# Patient Record
Sex: Male | Born: 1935
Health system: Southern US, Community
[De-identification: ages and names within clinical notes are randomized; demographics above are authoritative.]

## PROBLEM LIST (undated history)

## (undated) DIAGNOSIS — N184 Chronic kidney disease, stage 4 (severe): Secondary | ICD-10-CM

## (undated) DIAGNOSIS — I119 Hypertensive heart disease without heart failure: Secondary | ICD-10-CM

## (undated) DIAGNOSIS — I495 Sick sinus syndrome: Secondary | ICD-10-CM

## (undated) DIAGNOSIS — Z85828 Personal history of other malignant neoplasm of skin: Secondary | ICD-10-CM

## (undated) DIAGNOSIS — N400 Enlarged prostate without lower urinary tract symptoms: Secondary | ICD-10-CM

## (undated) DIAGNOSIS — I4891 Unspecified atrial fibrillation: Secondary | ICD-10-CM

## (undated) DIAGNOSIS — C61 Malignant neoplasm of prostate: Secondary | ICD-10-CM

## (undated) DIAGNOSIS — I2510 Atherosclerotic heart disease of native coronary artery without angina pectoris: Secondary | ICD-10-CM

## (undated) DIAGNOSIS — E119 Type 2 diabetes mellitus without complications: Secondary | ICD-10-CM

## (undated) DIAGNOSIS — D649 Anemia, unspecified: Secondary | ICD-10-CM

## (undated) DIAGNOSIS — I5032 Chronic diastolic (congestive) heart failure: Secondary | ICD-10-CM

## (undated) DIAGNOSIS — M519 Unspecified thoracic, thoracolumbar and lumbosacral intervertebral disc disorder: Secondary | ICD-10-CM

## (undated) DIAGNOSIS — Z950 Presence of cardiac pacemaker: Secondary | ICD-10-CM

## (undated) HISTORY — DX: Unspecified thoracic, thoracolumbar and lumbosacral intervertebral disc disorder: M51.9

## (undated) HISTORY — PX: COLONOSCOPY: SHX174

## (undated) HISTORY — DX: Sick sinus syndrome: I49.5

## (undated) HISTORY — PX: OTHER SURGICAL HISTORY: SHX169

## (undated) HISTORY — PX: EYE SURGERY: SHX253

## (undated) HISTORY — DX: Malignant neoplasm of prostate: C61

## (undated) HISTORY — DX: Benign prostatic hyperplasia without lower urinary tract symptoms: N40.0

## (undated) HISTORY — DX: Atherosclerotic heart disease of native coronary artery without angina pectoris: I25.10

---

## 1978-03-11 HISTORY — PX: APPENDECTOMY: SHX54

## 1990-03-11 DIAGNOSIS — M519 Unspecified thoracic, thoracolumbar and lumbosacral intervertebral disc disorder: Secondary | ICD-10-CM

## 1990-03-11 HISTORY — DX: Unspecified thoracic, thoracolumbar and lumbosacral intervertebral disc disorder: M51.9

## 1997-08-31 ENCOUNTER — Other Ambulatory Visit: Admission: RE | Admit: 1997-08-31 | Discharge: 1997-08-31 | Payer: Self-pay | Admitting: Gastroenterology

## 1997-09-05 ENCOUNTER — Ambulatory Visit (HOSPITAL_COMMUNITY): Admission: RE | Admit: 1997-09-05 | Discharge: 1997-09-05 | Payer: Self-pay | Admitting: Gastroenterology

## 1998-04-14 ENCOUNTER — Encounter: Payer: Self-pay | Admitting: Urology

## 1998-04-14 ENCOUNTER — Ambulatory Visit (HOSPITAL_COMMUNITY): Admission: RE | Admit: 1998-04-14 | Discharge: 1998-04-14 | Payer: Self-pay | Admitting: Urology

## 1999-09-19 ENCOUNTER — Encounter: Payer: Self-pay | Admitting: Orthopedic Surgery

## 1999-09-19 ENCOUNTER — Ambulatory Visit (HOSPITAL_COMMUNITY): Admission: RE | Admit: 1999-09-19 | Discharge: 1999-09-19 | Payer: Self-pay | Admitting: Orthopedic Surgery

## 1999-10-03 ENCOUNTER — Ambulatory Visit (HOSPITAL_COMMUNITY): Admission: RE | Admit: 1999-10-03 | Discharge: 1999-10-03 | Payer: Self-pay | Admitting: Orthopedic Surgery

## 1999-10-03 ENCOUNTER — Encounter: Payer: Self-pay | Admitting: Orthopedic Surgery

## 1999-10-16 ENCOUNTER — Ambulatory Visit (HOSPITAL_COMMUNITY): Admission: RE | Admit: 1999-10-16 | Discharge: 1999-10-16 | Payer: Self-pay | Admitting: Orthopedic Surgery

## 1999-10-16 ENCOUNTER — Encounter: Payer: Self-pay | Admitting: Orthopedic Surgery

## 1999-10-25 ENCOUNTER — Ambulatory Visit (HOSPITAL_COMMUNITY): Admission: RE | Admit: 1999-10-25 | Discharge: 1999-10-25 | Payer: Self-pay | Admitting: Urology

## 1999-10-25 ENCOUNTER — Encounter: Payer: Self-pay | Admitting: Urology

## 1999-10-30 ENCOUNTER — Ambulatory Visit (HOSPITAL_COMMUNITY): Admission: RE | Admit: 1999-10-30 | Discharge: 1999-10-30 | Payer: Self-pay | Admitting: Orthopedic Surgery

## 1999-10-30 ENCOUNTER — Encounter: Payer: Self-pay | Admitting: Orthopedic Surgery

## 1999-10-30 ENCOUNTER — Inpatient Hospital Stay (HOSPITAL_COMMUNITY): Admission: EM | Admit: 1999-10-30 | Discharge: 1999-11-01 | Payer: Self-pay | Admitting: Orthopedic Surgery

## 1999-10-30 ENCOUNTER — Encounter (INDEPENDENT_AMBULATORY_CARE_PROVIDER_SITE_OTHER): Payer: Self-pay

## 2000-01-02 ENCOUNTER — Ambulatory Visit (HOSPITAL_COMMUNITY): Admission: RE | Admit: 2000-01-02 | Discharge: 2000-01-02 | Payer: Self-pay | Admitting: Neurosurgery

## 2000-01-02 ENCOUNTER — Encounter: Payer: Self-pay | Admitting: Neurosurgery

## 2000-01-10 HISTORY — PX: LUMBAR LAMINECTOMY: SHX95

## 2000-02-06 ENCOUNTER — Encounter: Payer: Self-pay | Admitting: Neurosurgery

## 2000-02-08 ENCOUNTER — Encounter: Payer: Self-pay | Admitting: Neurosurgery

## 2000-02-08 ENCOUNTER — Ambulatory Visit (HOSPITAL_COMMUNITY): Admission: RE | Admit: 2000-02-08 | Discharge: 2000-02-09 | Payer: Self-pay | Admitting: Neurosurgery

## 2000-08-15 ENCOUNTER — Ambulatory Visit (HOSPITAL_COMMUNITY): Admission: RE | Admit: 2000-08-15 | Discharge: 2000-08-15 | Payer: Self-pay | Admitting: Neurosurgery

## 2000-08-15 ENCOUNTER — Encounter: Payer: Self-pay | Admitting: Neurosurgery

## 2000-08-29 ENCOUNTER — Encounter: Admission: RE | Admit: 2000-08-29 | Discharge: 2000-11-08 | Payer: Self-pay | Admitting: Anesthesiology

## 2000-10-07 ENCOUNTER — Ambulatory Visit (HOSPITAL_COMMUNITY): Admission: RE | Admit: 2000-10-07 | Discharge: 2000-10-07 | Payer: Self-pay | Admitting: Neurosurgery

## 2000-10-10 ENCOUNTER — Encounter: Payer: Self-pay | Admitting: Urology

## 2000-10-10 ENCOUNTER — Ambulatory Visit (HOSPITAL_COMMUNITY): Admission: RE | Admit: 2000-10-10 | Discharge: 2000-10-10 | Payer: Self-pay | Admitting: Urology

## 2000-12-10 ENCOUNTER — Encounter: Payer: Self-pay | Admitting: Neurosurgery

## 2000-12-10 ENCOUNTER — Encounter: Admission: RE | Admit: 2000-12-10 | Discharge: 2000-12-10 | Payer: Self-pay | Admitting: Neurosurgery

## 2001-03-11 HISTORY — PX: CORONARY ARTERY BYPASS GRAFT: SHX141

## 2001-03-11 HISTORY — PX: CARDIAC CATHETERIZATION: SHX172

## 2001-05-02 ENCOUNTER — Inpatient Hospital Stay (HOSPITAL_COMMUNITY): Admission: EM | Admit: 2001-05-02 | Discharge: 2001-05-09 | Payer: Self-pay | Admitting: Emergency Medicine

## 2001-05-02 ENCOUNTER — Encounter: Payer: Self-pay | Admitting: Emergency Medicine

## 2001-05-04 ENCOUNTER — Encounter: Payer: Self-pay | Admitting: Internal Medicine

## 2001-05-05 ENCOUNTER — Encounter: Payer: Self-pay | Admitting: Thoracic Surgery (Cardiothoracic Vascular Surgery)

## 2001-05-06 ENCOUNTER — Encounter: Payer: Self-pay | Admitting: Thoracic Surgery (Cardiothoracic Vascular Surgery)

## 2001-05-07 ENCOUNTER — Encounter: Payer: Self-pay | Admitting: Thoracic Surgery (Cardiothoracic Vascular Surgery)

## 2001-05-26 ENCOUNTER — Encounter (HOSPITAL_COMMUNITY): Admission: RE | Admit: 2001-05-26 | Discharge: 2001-08-24 | Payer: Self-pay | Admitting: Cardiology

## 2006-08-07 ENCOUNTER — Encounter: Admission: RE | Admit: 2006-08-07 | Discharge: 2006-08-07 | Payer: Self-pay | Admitting: Internal Medicine

## 2009-11-24 ENCOUNTER — Encounter: Admission: RE | Admit: 2009-11-24 | Discharge: 2009-11-24 | Payer: Self-pay | Admitting: Cardiology

## 2009-12-20 ENCOUNTER — Inpatient Hospital Stay (HOSPITAL_BASED_OUTPATIENT_CLINIC_OR_DEPARTMENT_OTHER): Admission: RE | Admit: 2009-12-20 | Discharge: 2009-12-20 | Payer: Self-pay | Admitting: Cardiology

## 2009-12-22 ENCOUNTER — Encounter: Payer: Self-pay | Admitting: Internal Medicine

## 2010-01-08 ENCOUNTER — Ambulatory Visit: Payer: Self-pay | Admitting: Internal Medicine

## 2010-01-08 DIAGNOSIS — I495 Sick sinus syndrome: Secondary | ICD-10-CM

## 2010-01-10 ENCOUNTER — Encounter: Payer: Self-pay | Admitting: Internal Medicine

## 2010-01-11 ENCOUNTER — Encounter (INDEPENDENT_AMBULATORY_CARE_PROVIDER_SITE_OTHER): Payer: Self-pay | Admitting: *Deleted

## 2010-02-06 ENCOUNTER — Telehealth (INDEPENDENT_AMBULATORY_CARE_PROVIDER_SITE_OTHER): Payer: Self-pay | Admitting: *Deleted

## 2010-02-06 ENCOUNTER — Encounter: Payer: Self-pay | Admitting: Internal Medicine

## 2010-02-12 ENCOUNTER — Ambulatory Visit: Payer: Self-pay | Admitting: Internal Medicine

## 2010-03-06 ENCOUNTER — Encounter: Payer: Self-pay | Admitting: Internal Medicine

## 2010-03-06 ENCOUNTER — Ambulatory Visit: Payer: Self-pay | Admitting: Internal Medicine

## 2010-03-06 DIAGNOSIS — R002 Palpitations: Secondary | ICD-10-CM

## 2010-03-06 DIAGNOSIS — I2510 Atherosclerotic heart disease of native coronary artery without angina pectoris: Secondary | ICD-10-CM

## 2010-03-06 HISTORY — DX: Atherosclerotic heart disease of native coronary artery without angina pectoris: I25.10

## 2010-03-11 HISTORY — PX: PACEMAKER INSERTION: SHX728

## 2010-04-10 NOTE — Letter (Signed)
Summary: Dr [facility]  Dr Lacretia Nicks. [facility]   Imported By: Marylou Mccoy 01/12/2010 11:20:23  _____________________________________________________________________  External Attachment:    Type:   Image     Comment:   External Document

## 2010-04-10 NOTE — Progress Notes (Signed)
Summary: Records Request  Faxed OV & EKG to [facility] at Dr. [facility] ([phone]). Debby Freiberg  February 06, 2010 3:30 PM

## 2010-04-10 NOTE — Letter (Signed)
Summary: [facility] Visit Note   [facility] Visit Note   Imported By: Roderic Ovens 01/15/2010 15:09:38  _____________________________________________________________________  External Attachment:    Type:   Image     Comment:   External Document

## 2010-04-10 NOTE — Letter (Signed)
Summary: Implantable Device Instructions  Architectural technologist, Main Office  [address]   [place], [place] [address]   Phone: [phone]  Fax: [phone]      Implantable Device Instructions  You are scheduled for:  _____ Permanent Transvenous Pacemaker _____ Implantable Cardioverter Defibrillator ___X__ Implantable Loop Recorder _____ Generator Change  on February 14, 2010 At 10:30 am with Dr. Graciela Husbands.  1.  Please arrive at the Short Stay Center at [facility] at 8:30 on the day of your procedure.  2.  Do not eat or drink after midnight the morning of your procedure.  3.  Complete lab work on February 12, 2010 at 9:15 am.  The lab at [address] is open from 8:30 AM to 1:30 PM and from 2:30 PM to 5:00 PM.  The lab at [facility] is open from 7:30 AM to 5:30 PM.  You do not have to be fasting.  4.  Do NOT take these medications the morning of your procedure: Glimepiride and Triamterene-Hctz. You may take all other medications with a sip of water.   5.  Plan for an overnight stay.  Bring your insurance cards and a list of your medications.  6.  Wash your chest and neck with antibacterial soap (any brand) the evening before and the morning of your procedure.  Rinse well.   *If you have ANY questions after you get home, please call the office [phone]. Claris Gladden, RN  *Every attempt is made to prevent procedures from being rescheduled.  Due to the nauture of Electrophysiology, rescheduling can happen.  The physician is always aware and directs the staff when this occurs.

## 2010-04-10 NOTE — Assessment & Plan Note (Signed)
Summary: nep/ dx: dyspena . arrythmia . pt has medicare, aarp. gd   Visit Type:  new pt Referring Provider:  Dr Ileene Rubens Primary Provider:  Dr. Larina Earthly  CC:  shortness of breath, dizziness, and swelling.  History of Present Illness: Christopher [name] is seen at the request of Dr. Donnie Aho because of sinus node dysfunction and exercise intolerance. He has a 1-1/2 year history of progressive dyspnea on exertion. It has been on accompanied by chest pain. He has a history of bypass surgery in 2003 with the catheterization earlier this month demonstrating patent grafts a patent native RCA normal left ventricular function by nuclear study. He was admitted to treadmill testing and was found to have onset of junctional rhythm at a low rate of exercise. He is referred for consideration of pacing given his sinus node dysfunction.  He has not had problems with edema orthopnea nocturnal dyspnea; he said no syncope.  He also notes over the last 3-4 months a shoulder tiredness". He denies fevers or aches in his proximal thigh muscles.    Current Medications (verified): 1)  Benazepril Hcl 40 Mg Tabs (Benazepril Hcl) .... Once Daily 2)  Amlodipine Besylate 10 Mg Tabs (Amlodipine Besylate) .... Once Daily 3)  Finasteride 5 Mg Tabs (Finasteride) .... Once Daily 4)  Folic Acid 400 Mcg Tabs (Folic Acid) .... Once Daily 5)  Daily Multi  Tabs (Multiple Vitamins-Minerals) .... Once Daily 6)  Aspirin 81 Mg Tbec (Aspirin) .... Take One Tablet By Mouth Daily 7)  Glimepiride 1 Mg Tabs (Glimepiride) .... Once Daily 8)  Triamterene-Hctz 37.5-25 Mg Tabs (Triamterene-Hctz) .... 1/2 Tablet Daily 9)  Fish Oil 1000 Mg Caps (Omega-3 Fatty Acids) .... 4 Tablet Daily 10)  Sm Glucosamine Hcl 1500 Mg Tabs (Glucosamine Hcl) .... 2 Tablets Daily 11)  Horse Chestnut 300 Mg Caps (Horse [place]) .... 2 Tablets Daily  Allergies (verified): 1)  ! Pcn  Past History:  Past Surgical History: Last updated: 27-Jan-2010 CABG x  05-2001 cardiac catheterization Lumbar laminectomy in November 2001 Remote appendectomy  Family History: Last updated: 27-Jan-2010 Father and mother both died of cancer; 1 brother had bypass surgery in his 33s.  Social History: Last updated: 01/27/10 He was widowed from his 1st wife who died of cancer The patient is married, has three children of his own and three children of current wifes.   He does not smoke or drink.   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   Vital Signs:  Patient profile:   77 year old male Height:      74 inches Weight:      228.50 pounds BMI:     29.44 Pulse rate:   50 / minute BP sitting:   116 / 58  (left arm) Cuff size:   regular  Vitals Entered By: Caralee Ates CMA (January 08, 2010 2:45 PM)  Physical Exam  General:  Well developed, well nourished, elderly Caucasian male appearing his stated age in no acute distress. Head:  HEENT normal Neck:  supple without thyromegaly Chest Wall:  without kyphosis Lungs:  clear to auscultation Heart:  regular rate and rhythm albeit slow with an S4 but no significant murmurs Abdomen:  soft nontender  without hepatomegaly or midline pulsation Msk:  without gross skeletal deformities Pulses:  intact distal pulses Extremities:  no clubbing cyanosis or edema Neurologic:  alert and oriented x3 with grossly normal motor and sensory function Skin:  warm and dry without rashes Cervical Nodes:  without adenopathy Psych:  engaging  affect   EKG  Procedure date:  01/08/2010  Findings:      sinus @ 50  .18/10/45 rsr'  Impression & Recommendations:  Problem # 1:  SINOATRIAL NODE DYSFUNCTION (ICD-427.81) based on the information  from Dr. Donnie Aho, it certainly sounds like sinus node arrest which occurs intermittently may be associated with his symptoms. I tried to reproduce this on the treadmill today, however, I was unsuccessful in doing so. There is a great deal of difficulty reading P waves, there was some sinus dysfunction noted in recovery with  AIVR ;  I think this may be example where like Samule Dry I should go to the bank is that for the money isn't look at the data that Dr. Donnie Aho has already gathered. I suspect that his impression is right that pacing may diminish some of his symptoms. The challenge will be how to maintain a sinus rate faster than his junctional rate although this may be easier to accomplish that I think His updated medication list for this problem includes:    Benazepril Hcl 40 Mg Tabs (Benazepril hcl) ..... Once daily    Amlodipine Besylate 10 Mg Tabs (Amlodipine besylate) ..... Once daily    Aspirin 81 Mg Tbec (Aspirin) .Marland Kitchen... Take one tablet by mouth daily  Orders: Treadmill (Treadmill)

## 2010-04-10 NOTE — Cardiovascular Report (Signed)
Summary: Pre Op Orders   Pre Op Orders   Imported By: Roderic Ovens 01/29/2010 16:45:02  _____________________________________________________________________  External Attachment:    Type:   Image     Comment:   External Document

## 2010-04-12 NOTE — Assessment & Plan Note (Signed)
Summary: rov/ appt is 2 p.m. per dr. Graciela Husbands.gd   Visit Type:  rov Referring Charie Pinkus:  Dr Ileene Rubens Primary Christopher [name]:  Dr. Larina Earthly  CC:  sob, burning in chest, and dizziness.  History of Present Illness: Christopher [name] is seen in followup of sinus node dysfunction and exercise tolerance.  There has been some question as to whether his sinus rates and his intermittent junctional rhythm have been associated with the aforementioned symptoms. They are further accompanied by  a 1-1/2 year history of progressive dyspnea on exertion as well as chest pain.   He has a history of bypass surgery in 2003 with the catheterization earlier this month demonstrating patent grafts a patent native RCA normal left ventricular function by nuclear study. He was admitted to treadmill testing and was found to have onset of junctional rhythm at a low rate of exercise. He is referred for consideration of pacing given his sinus node dysfunction.  He has not had problems with edema orthopnea nocturnal dyspnea; he said no syncope.  He has had spells of jitteriness associated with chest discomfort. He underwent catheterization in the fall of this year demonstrating patent grafts and previously identified normal left ventricular function by Myoview scanning  Weeks admitted him for treadmill testing which showed adequate chronotropic response. Over the last couple of months ago he has done rather quite well. Until today when he noted shortness of breath walking from the parking deck as well as lightheadedness.  Problems Prior to Update: 1)  Sinoatrial Node Dysfunction  (ICD-427.81)  Current Medications (verified): 1)  Benazepril Hcl 40 Mg Tabs (Benazepril Hcl) .... Once Daily 2)  Amlodipine Besylate 10 Mg Tabs (Amlodipine Besylate) .... Once Daily 3)  Finasteride 5 Mg Tabs (Finasteride) .... Once Daily 4)  Folic Acid 400 Mcg Tabs (Folic Acid) .... Once Daily 5)  Daily Multi  Tabs (Multiple Vitamins-Minerals) .... Once  Daily 6)  Aspirin 81 Mg Tbec (Aspirin) .... Take One Tablet By Mouth Daily 7)  Glimepiride 1 Mg Tabs (Glimepiride) .... Once Daily 8)  Triamterene-Hctz 37.5-25 Mg Tabs (Triamterene-Hctz) .... 1/2 Tablet Daily 9)  Fish Oil 1000 Mg Caps (Omega-3 Fatty Acids) .... 4 Tablet Daily 10)  Sm Glucosamine Hcl 1500 Mg Tabs (Glucosamine Hcl) .... 2 Tablets Daily 11)  Horse Chestnut 300 Mg Caps (Horse [place]) .... 2 Tablets Daily  Allergies (verified): 1)  ! Pcn  Past History:  Past Medical History: Last updated: 01/08/2010 coronary artery disease Lumbar disc disease BPH  Essential hypertension  Diabetes mellitus, type 2  Past Surgical History: Last updated: Jan 18, 2010 CABG x 05-2001 cardiac catheterization Lumbar laminectomy in November 2001 Remote appendectomy  Family History: Last updated: 01/18/10 Father and mother both died of cancer; 1 brother had bypass surgery in his 63s.  Social History: Last updated: January 18, 2010 He was widowed from his 1st wife who died of cancer The patient is married, has three children of his own and three children of current wifes.   He does not smoke or drink.   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 HEENT Normal.  Neck veins were flat, carotids were brisk.  Lungs were clear.  Heart sounds were slow but regular without murmurs or gallops.  Abdomen was soft with active bowel sounds. There is no clubbing cyanosis or edema. Skin Warm and dry    Impression & Recommendations:  Problem # 1:  SINOATRIAL NODE DYSFUNCTION (ICD-427.81) Mr. Flaum continues to have symptoms  that are intermittent of exercise intolerance with documented heart rates in the 30s 40s and 50s. We have discussed the use of implantable loop orders. We also discussed the role of proceeding with pacing. At this juncture I would probably follow up favor the latter. He had been doing better until just recently when he had more symptoms of exercise intolerance.  He also has episodes where he feels a "jitteriness" accompanied by chest discomfort. se are always occurring together. It is his feeling that he former is associated with a fast heartbeat. He also spells where he needs to suddenly take a deep breath. His updated medication list for this problem includes:    Benazepril Hcl 40 Mg Tabs (Benazepril hcl) ..... Once daily    Amlodipine Besylate 10 Mg Tabs (Amlodipine besylate) ..... Once daily    Aspirin 81 Mg Tbec (Aspirin) .Marland Kitchen... Take one tablet by mouth daily  Problem # 2:  PALPITATIONS, RECURRENT (ICD-785.1) aabove His updated medication list for this problem includes:    Benazepril Hcl 40 Mg Tabs (Benazepril hcl) ..... Once daily    Amlodipine Besylate 10 Mg Tabs (Amlodipine besylate) ..... Once daily    Aspirin 81 Mg Tbec (Aspirin) .Marland Kitchen... Take one tablet by mouth daily  Problem # 3:  CAD, AUTOLOGOUS BYPASS GRAFT (ICD-414.02) stable His updated medication list for this problem includes:    Benazepril Hcl 40 Mg Tabs (Benazepril hcl) ..... Once daily    Amlodipine Besylate 10 Mg Tabs (Amlodipine besylate) ..... Once daily    Aspirin 81 Mg Tbec (Aspirin) .Marland Kitchen... Take one tablet by mouth daily  Patient Instructions: 1)  Your physician recommends that you continue on your current medications as directed. Please refer to the Current Medication list given to you today. 2)  We will follow-up with you after Dr. Graciela Husbands speaks with Dr. Donnie Aho.

## 2010-04-12 NOTE — Letter (Signed)
Summary: [facility]  [facility]   Imported By: Marylou Mccoy 02/27/2010 10:28:14  _____________________________________________________________________  External Attachment:    Type:   Image     Comment:   External Document

## 2010-05-09 ENCOUNTER — Telehealth: Payer: Self-pay | Admitting: Internal Medicine

## 2010-05-09 DIAGNOSIS — R0602 Shortness of breath: Secondary | ICD-10-CM | POA: Insufficient documentation

## 2010-05-11 ENCOUNTER — Encounter (INDEPENDENT_AMBULATORY_CARE_PROVIDER_SITE_OTHER): Payer: Self-pay | Admitting: *Deleted

## 2010-05-17 NOTE — Letter (Signed)
Summary: Appointment - Reschedule  [employer], Main Office  [address]   [place], [place] [address]   Phone: [phone]  Fax: [phone]     May 11, 2010 MRN: [id number]   Christopher [name] [address] [place], [place]  [address]   Dear Christopher [name],   Due to a change in our office schedule, your appointment on 05-30-2010                       at     10:55 a.m.      must be changed.  It is very important that we reach you to reschedule this appointment. We look forward to participating in your health care needs. Please contact us at the number listed above at your earliest convenience to reschedule this appointment.     Sincerely,      Lorne Skeens  [facility] Scheduling Team

## 2010-05-18 ENCOUNTER — Encounter: Payer: Self-pay | Admitting: Internal Medicine

## 2010-05-23 LAB — POCT I-STAT GLUCOSE: Glucose, Bld: 103 mg/dL — ABNORMAL HIGH (ref 70–99)

## 2010-05-24 ENCOUNTER — Ambulatory Visit (HOSPITAL_COMMUNITY): Payer: Medicare Other | Attending: Internal Medicine

## 2010-05-24 DIAGNOSIS — R0609 Other forms of dyspnea: Secondary | ICD-10-CM

## 2010-05-24 DIAGNOSIS — R0989 Other specified symptoms and signs involving the circulatory and respiratory systems: Secondary | ICD-10-CM | POA: Insufficient documentation

## 2010-05-29 NOTE — Progress Notes (Signed)
  Phone Note Other Incoming   Summary of Call: CALLED PT RE  NEEDING CPX DONE AND F/U WITH DR Graciela Husbands AFTERWARDS PER DR Graciela Husbands . DR Graciela Husbands SPOKE WITH DR Iona Coach RE PT COMPLAINING OF SOB  THIS WAS THE PLAN DISCUSSED WITH DR Donnie Aho.PT AWARE OF ABOVE AND INSTRUCTIONS REVIEWED VIA PHONE  Initial call taken by: Scherrie Bateman, LPN,  May 09, 2010 1:27 PM  Follow-up for Phone Call        scheduled  Follow-up by: Nathen May, MD, [facility],  May 23, 2010 11:39 AM  New Problems: DYSPNEA (ICD-786.05)   New Problems: DYSPNEA (ICD-786.05)

## 2010-05-30 ENCOUNTER — Ambulatory Visit: Payer: Self-pay | Admitting: Internal Medicine

## 2010-06-25 ENCOUNTER — Encounter: Payer: Self-pay | Admitting: Internal Medicine

## 2010-06-25 ENCOUNTER — Ambulatory Visit (INDEPENDENT_AMBULATORY_CARE_PROVIDER_SITE_OTHER): Payer: Medicare Other | Admitting: Internal Medicine

## 2010-06-25 DIAGNOSIS — R609 Edema, unspecified: Secondary | ICD-10-CM

## 2010-06-25 DIAGNOSIS — I495 Sick sinus syndrome: Secondary | ICD-10-CM

## 2010-06-25 DIAGNOSIS — R0602 Shortness of breath: Secondary | ICD-10-CM

## 2010-06-25 NOTE — Progress Notes (Signed)
  HPI  Christopher [name] is a 77 y.o. male Seen in followup because of symptomatic exercise intolerance attributed to intermittent junctional rhythm.  This is quite hard to clarify; he underwent testing with Dr. Donnie Aho suggesting the junctional rhythm and sinus node slowing. We repeated treadmill testing and saw nothing. An event recorder raises issue again and that we undertook cardiopulmonary stress testing which demonstrated possible ventilatory defect however, there is also isorhythmic AV dissociation with accelerated junctional rhythm seen at peak exercise that correlated with symptoms.  Last couple of weeks he has done quite well. This morning his and problematic again.  He has a positive lower extremity edema.   Past Medical History  Diagnosis Date  . CAD (coronary artery disease)   . Lumbar disc disease   . BPH (benign prostatic hyperplasia)   . HTN (hypertension)   . Diabetes mellitus type II     Past Surgical History  Procedure Date  . Coronary artery bypass graft 2003    x3  . Cardiac catheterization   . Lumbar laminectomy 11/01  . Appendectomy     Current Outpatient Prescriptions  Medication Sig Dispense Refill  . amLODipine (NORVASC) 10 MG tablet Take 10 mg by mouth daily.        Marland Kitchen aspirin 81 MG tablet Take 81 mg by mouth daily.        . benazepril (LOTENSIN) 40 MG tablet Take 40 mg by mouth daily.        . finasteride (PROSCAR) 5 MG tablet Take 5 mg by mouth daily.        . folic acid (FOLVITE) 400 MCG tablet Take 400 mcg by mouth daily.        Marland Kitchen glimepiride (AMARYL) 1 MG tablet Take 1 mg by mouth daily before breakfast.        . Glucosamine HCl 1500 MG TABS Take 2 tablets by mouth daily.        . Horse Chestnut 300 MG CAPS Take 2 capsules by mouth daily.        . Multiple Vitamins-Minerals (MULTIVITAL-M) TABS daily.        . Omega-3 Fatty Acids (FISH OIL) 1000 MG CAPS Take 4 capsules by mouth daily.        Marland Kitchen triamterene-hydrochlorothiazide (MAXZIDE-25) 37.5-25 MG  per tablet Take 0.5 tablets by mouth daily.          Allergies  Allergen Reactions  . Penicillins     Review of Systems negative except from HPI and PMH  Physical Exam Well developed and well nourished in no acute distress HENT normal E scleral and icterus clear Neck Supple JVP flat; carotids brisk and full Clear to ausculation Regular rate and rhythm, no murmurs gallops or rub Soft with active bowel sounds No clubbing cyanosis; 2+ edemaAlert and oriented, grossly normal motor and sensory function Skin Warm and Dry  Assessment and  Plan

## 2010-06-25 NOTE — Assessment & Plan Note (Signed)
The patient has sinus node dysfunction with the emergence of isorhythmic AV dissociation related to a junctional rhythm. This occurs at high levels of exercise. We discussed the role of pacing to try to obviate this. Indeed we would need to program the Max sensor rate to be sure that we maintain AV synchrony.. The benefits and risks were reviewed including but not limited to death,  perforation, infection, lead dislodgement and device malfunction.  The patient understands agrees; he will either call us or until he talks to Dr. Donnie Aho next before proceeding.

## 2010-06-25 NOTE — Assessment & Plan Note (Signed)
-   As below

## 2010-06-25 NOTE — Assessment & Plan Note (Signed)
I wanted her to be this may be related to his amlodipine. I suggested that he followup with Dr.Avva About this.

## 2010-06-25 NOTE — Patient Instructions (Signed)
Your physician recommends that you schedule a follow-up appointment in: PT TO CALL IF OR WHEN  HE WANTS PACEMAKER  Your physician recommends that you continue on your current medications as directed. Please refer to the Current Medication list given to you today.

## 2010-06-27 ENCOUNTER — Telehealth: Payer: Self-pay | Admitting: Internal Medicine

## 2010-06-29 NOTE — Telephone Encounter (Signed)
Pt left message on 4-18 re setting up surgery and hasn't heard back, needs to work it around vacation time and would like a call back asap-pls call [phone]

## 2010-06-29 NOTE — Telephone Encounter (Signed)
Pt aware Christine and Dr Graciela Husbands are not in the office to schedule pacer placement.  Pt is going on vacation and would like to have pacer placed sometime after May 5th.  Informed pt I will send this request to Wynona Canes to schedule and she will contact him.  [phone].

## 2010-07-03 ENCOUNTER — Encounter: Payer: Self-pay | Admitting: *Deleted

## 2010-07-03 NOTE — Telephone Encounter (Signed)
SPOKE WITH PT  WILL WORK ON SCHEDULING IMPLANT FOR 07/26/10 INFORMED PT WILL CALL BACK LATER WITH DETAILS AND INSTRUCTIONS./CY

## 2010-07-03 NOTE — Telephone Encounter (Signed)
PT AWARE THAT PROCEDURE WAS SCHEDULED AS WE TALKED ABOUT EARLIER THIS AM FOR 07/26/10 AT 7:30 AM PT TO BE AT SHORT STAY  AT 5:30 AM  ALSO PT HAVING LABS DONE ON 5/10 /12 PT NOTIFIED THAT INSTRUCIOTNS FOR PROCEDURE WILL BE LEFT AT FRONT DESK  FOR PICK UP  ON LAB DAY .Zack Seal

## 2010-07-19 ENCOUNTER — Other Ambulatory Visit (INDEPENDENT_AMBULATORY_CARE_PROVIDER_SITE_OTHER): Payer: Medicare Other | Admitting: *Deleted

## 2010-07-19 DIAGNOSIS — I495 Sick sinus syndrome: Secondary | ICD-10-CM

## 2010-07-19 DIAGNOSIS — Z01810 Encounter for preprocedural cardiovascular examination: Secondary | ICD-10-CM

## 2010-07-19 LAB — CBC WITH DIFFERENTIAL/PLATELET
Basophils Relative: 0.3 % (ref 0.0–3.0)
HCT: 40.8 % (ref 39.0–52.0)
Hemoglobin: 13.8 g/dL (ref 13.0–17.0)
Lymphocytes Relative: 24.5 % (ref 12.0–46.0)
Lymphs Abs: 1.7 10*3/uL (ref 0.7–4.0)
MCHC: 33.8 g/dL (ref 30.0–36.0)
Monocytes Relative: 8.7 % (ref 3.0–12.0)
Neutro Abs: 4.6 10*3/uL (ref 1.4–7.7)
RBC: 4.54 Mil/uL (ref 4.22–5.81)

## 2010-07-19 LAB — BASIC METABOLIC PANEL
CO2: 26 mEq/L (ref 19–32)
Calcium: 9.8 mg/dL (ref 8.4–10.5)
Glucose, Bld: 82 mg/dL (ref 70–99)
Potassium: 5.2 mEq/L — ABNORMAL HIGH (ref 3.5–5.1)
Sodium: 143 mEq/L (ref 135–145)

## 2010-07-26 ENCOUNTER — Ambulatory Visit (HOSPITAL_COMMUNITY): Payer: Medicare Other

## 2010-07-26 ENCOUNTER — Ambulatory Visit (HOSPITAL_COMMUNITY)
Admission: RE | Admit: 2010-07-26 | Discharge: 2010-07-27 | Disposition: A | Discharge status: Home / Self Care | Payer: Medicare Other | Source: Ambulatory Visit | Attending: Internal Medicine | Admitting: Internal Medicine

## 2010-07-26 DIAGNOSIS — M5137 Other intervertebral disc degeneration, lumbosacral region: Secondary | ICD-10-CM | POA: Insufficient documentation

## 2010-07-26 DIAGNOSIS — I2510 Atherosclerotic heart disease of native coronary artery without angina pectoris: Secondary | ICD-10-CM | POA: Insufficient documentation

## 2010-07-26 DIAGNOSIS — I495 Sick sinus syndrome: Secondary | ICD-10-CM

## 2010-07-26 DIAGNOSIS — E119 Type 2 diabetes mellitus without complications: Secondary | ICD-10-CM | POA: Insufficient documentation

## 2010-07-26 DIAGNOSIS — M51379 Other intervertebral disc degeneration, lumbosacral region without mention of lumbar back pain or lower extremity pain: Secondary | ICD-10-CM | POA: Insufficient documentation

## 2010-07-26 DIAGNOSIS — I10 Essential (primary) hypertension: Secondary | ICD-10-CM | POA: Insufficient documentation

## 2010-07-26 DIAGNOSIS — N400 Enlarged prostate without lower urinary tract symptoms: Secondary | ICD-10-CM | POA: Insufficient documentation

## 2010-07-26 LAB — SURGICAL PCR SCREEN: Staphylococcus aureus: POSITIVE — AB

## 2010-07-26 LAB — BASIC METABOLIC PANEL
CO2: 22 mEq/L (ref 19–32)
Chloride: 110 mEq/L (ref 96–112)
GFR calc Af Amer: 48 mL/min — ABNORMAL LOW (ref >60)
Potassium: 5.3 mEq/L — ABNORMAL HIGH (ref 3.5–5.1)

## 2010-07-26 LAB — GLUCOSE, CAPILLARY
Glucose-Capillary: 111 mg/dL — ABNORMAL HIGH (ref 70–99)
Glucose-Capillary: 106 mg/dL — ABNORMAL HIGH (ref 70–99)
Glucose-Capillary: 85 mg/dL (ref 70–99)

## 2010-07-27 ENCOUNTER — Ambulatory Visit (HOSPITAL_COMMUNITY): Payer: Medicare Other

## 2010-07-27 NOTE — Consult Note (Signed)
[place]  Patient:    Christopher [name], Christopher [name] Visit Number: [id number] MRN: [id number]          Service Type: MED Location: [id number] Attending Physician:  Charlett Lango Dictated by:   Darden Palmer., M.D. Admit Date:  05/02/2001   CC:         Ravi R. Felipa Eth, M.D.   Consultation Report  HISTORY OF PRESENT ILLNESS:  Thank you for asking me to see this 77 year old male for cardiologic opinion regarding chest pain. The patient has a prior history of hypertension and recently diagnosed diabetes that is not controlled. He might also have slight elevation of cholesterol. He had a 3-day history of some vague, mid-sternal chest pain described as pressure or heaviness that would be relieved with rest. Last evening he went to bed and woke with substernal chest discomfort radiating into the left arm. He took aspirin and the symptoms intensified, and he was brought to the emergency room and was given nitroglycerin with some relief of his symptoms. He was begun on IV nitroglycerin and had some recurrence of chest pain that had been improved. He is admitted at this time to rule out myocardial infarction or unstable angina. He does give a previous history of chest pain evaluation around 4 years ago with a treadmill test.  PAST MEDICAL HISTORY:  Remarkable for hypertension for around 7 years. Diabetes was diagnosed at his last physical. He also has slight elevation of cholesterol. There is no history of ulcers.  PAST SURGICAL HISTORY:  Back surgery in 1992 for ruptured disk, and recently 1 year ago for spinal stenosis. Appendectomy, remotely.  ALLERGIES:  PENICILLIN.  CURRENT MEDICATIONS:  Lotensin 20/12.5 daily.  FAMILY HISTORY:  Father and mother both died of cancer; 1 brother had bypass surgery in his 17s.  SOCIAL HISTORY:  He was widowed from his 1st wife who died of cancer and remarried his current wife. He has 3 children by his 1st  marriage and 3 stepchildren by his 2nd marriage. He is a nonsmoker; does not use alcohol to excess. He smoked briefly but quit in 1970. He is a retired Arts development officer person. He attends [employer].  REVIEW OF SYSTEMS:   Significant low back pain; some numbness in the right side attributed to previous ruptured disk. He has no impotence, no erectile dysfunction, no claudication, no TIA. No problems other than as noted above. Remainder of review of systems is unremarkable.  PHYSICAL EXAMINATION:  GENERAL:  He is a pleasant male who is currently in no acute distress.  VITAL SIGNS:  Blood pressure 130/70, pulse 60.  SKIN:  Warm and dry.  HEENT:  EOMI.  PERRLA. Ears are clear. Pharynx negative.  NECK:  Supple without masses; no JVD, thyromegaly or bruits.  LUNGS:  Clear to auscultation and percussion.  CARDIAC:  Normal S1, S2. No S3, S4, or murmur.  ABDOMEN:  Soft, nontender. No hepatosplenomegaly or mass.  EXTREMITIES:  Pulses present 2+. No edema noted.  LABORATORY AND ACCESSORY DATA:  12-lead EKG initially was normal. Repeat EKG on the 4 and 5, no 8, shows questionable biphasic T waves of the anterior leads. Troponin I is .11. CPK and MB were normal.  IMPRESSION: 1. Chest pain suggestive of an acute coronary syndrome by history.    Slightly elevated troponin I. 2. Hypertension. 3. Recent diagnosis of diabetes, possibly worse by use of    glucosamine.  RECOMMENDATIONS:  I would change him  to intravenous heparin to aid in transition to catheterization lab. Add beta blocker. If he has recurrent pain, begin integrilin. Rule out MI with serial CPKs and EKGs. He will require cardiac catheterization for assessment. We will schedule this Monday but may move up if symptoms worsen or recur and are able to be controlled medically. Continue intravenous nitroglycerin. Dictated by:   Darden Palmer., M.D. Attending Physician:  Charlett Lango DD:   05/02/01 TD:  05/04/01 Job: [id number]

## 2010-07-27 NOTE — Op Note (Signed)
[place]. [facility]  Patient:    Christopher [name], Christopher [name]                        MRN: [id number] Proc. Date: 02/08/00 Adm. Date:  [id number] Disc. Date: [id number] Attending:  Ollen Gross V                           Operative Report  PREOPERATIVE DIAGNOSIS:  Lumbar stenosis, L4-5.  POSTOPERATIVE DIAGNOSIS:  Lumbar stenosis, L4-5.  PROCEDURE: 1. Bilateral L4-5 decompressive laminectomy. 2. Microdissection, right L5 nerve root and L4-5 disk.  SURGEON:  Reinaldo Meeker, M.D.  ASSISTANT:  Julio Sicks, M.D.  DESCRIPTION OF PROCEDURE:  After having been placed in the prone position, the patients back was prepped and draped in the usual sterile fashion.  A localizing x-ray was taken prior to incision to identify the L4-5 level.  A midline incision was made over the spinous processes of L4 and L5.  Using the Bovie cutting current, the incision was carried down to the spinous processes. Subperiosteal dissection was then carried out bilaterally along the spinous processes and lamina, and the McCullough self-retaining retractor was placed for exposure.  A second x-ray was taken to confirm approach to the L4-5 level, and this was correct.  The spinous processes and interspinous ligament were removed with a Leksell rongeur.  Starting on the patients left side, generous laminotomy was performed by removing the inferior one-half of the L4 lamina and the medial one-third of the fact joint and the superior one-half of the L5 lamina.  A similar laminotomy was then performed on the patients right side. At this time, residual bone and ligamentum flavum were removed in a piecemeal fashion for a thorough decompression of the thecal sac and proximal L5 nerve roots bilaterally.  At this point the microscope was draped and brought in the field and used for the remainder of the case.  On the patients right, the primarily symptomatic side, microdissection technique was used to identify  the lateral aspects of the thecal sac and L5 nerve root.  Further coagulation was carried out down to the floor of the canal to identify the L4-5 disk.  This was found to not be herniated and not even be bulging and was causing no compression on the nerve root.  It was felt that it could be left intact. Further foraminal decompression was carried out on the L5 nerve root on the right.  Inspection on the left side showed the nerve root to be well-decompressed.  At this point, large amounts of irrigation were carried out and any bleeding controlled with bipolar coagulation and Gelfoam.  The wound was then closed using interrupted Vicryl on the muscle and fascia and subcutaneous and subcuticular tissues, and staples on the skin.  A sterile dressing was then applied, and the patient was extubated and taken to the recovery room in stable condition. DD:  02/08/00 TD:  02/08/00 Job: [id number]

## 2010-07-27 NOTE — Procedures (Signed)
[facility], [place]  Patient:    Christopher [name], Christopher [name]                        MRN: [id number] Proc. Date: 09/01/00 Adm. Date:  [id number] Attending:  Thyra Breed CC:         Reinaldo Meeker, M.D.  Ravi R. Felipa Eth, M.D.   Procedure Report  PROCEDURE:  Lumbar epidural steroid injection.  DIAGNOSIS:  Lumbar radiculopathy with lumbar spondylosis status post posterior decompression.  HISTORY OF PRESENT ILLNESS:  [name] is a very pleasant 77 year old gentleman sent by Dr. Aliene Beams for a series of lumbar epidural steroid injections. The patient states that he was in his usual state of good health up until 12 months ago when he noted right hip discomfort when he would walk. It would radiate out into his foot. He was seen by Dr. Ollen Gross who initially thought he had back problems and sent [facility] for a series of lumbar epidural steroid injections. He did not improve and was sent to Dr. Gerlene Fee after an MRI demonstrated central canal stenosis at 4-5 with some disk herniation and underlying spondylosis. He underwent a posterior decompression at L4-5 and was doing well following this in November only to have his pain reoccur in the same distribution. He describes it as a sharp pain in his hip and a burning squeezing type pain in his leg which occurs whenever he stands for greater than 5-10 minutes or walks 100 yards. He has minimal pain when he sites. He denied numbness or tingling, weakness or bowel or bladder incontinence. He has been treated with Celebrex with no sustained improvement. A MRI was performed on August 15, 2000 which shoed some epidural fibrosis at 4-5 and facet joint arthritis with some degenerative disk disease at multiple levels.  CURRENT MEDICATIONS:  Lotensin.  ALLERGIES:  PENICILLIN which causes a rash.  FAMILY HISTORY:  Positive for cancer, coronary artery disease, and hypertension.  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  PAST SURGICAL HISTORY:  Significant for appendectomy and in 1992 a lumbar herniated disk which was repaired by Dr. Gerlene Fee with total resolution and symptoms in 2001. Lumbar decompression.  SOCIAL HISTORY:  The patients a retired Architect. He does not smoke or drink alcohol.  REVIEW OF SYSTEMS:  GENERAL:  Negative although he does give a history of an FUO a year ago which resolved spontaneously. HEAD:  Negative. EYES: Significant for cataracts. NOSE/MOUTH/THROAT:  Significant for intermittent burning of the back of his throat but otherwise negative. EARS:  Negative. PULMONARY: Negative. CARDIOVASCULAR:  Significant for hypertension for 7-8 years. GI: Negative. GU:  Negative. MUSCULOSKELETAL:  See HPI. NEUROLOGIC: No history of seizure or stroke. HEMATOLOGIC:  Negative. ENDOCRINE: Negative. CUTANEOUS:  Negative. PSYCHIATRIC:  Negative. ALLERGY/IMMUNOLOGIC:  Negative.  PHYSICAL EXAMINATION:  VITAL SIGNS:  Blood pressure is 137/73, heart rate 55, respiratory rate 16, O2 saturations 94% and pain level is 2/10 sitting.  GENERAL:  This is a pleasant male in no acute distress.  HEENT:  Head was normocephalic, atraumatic. Eyes, pupils equal round and reactive to light and accommodation. Extraocular movements intact. Conjunctivae and sclerae clear. Nose patent nares without discharge. Oropharynx was free of lesions.  NECK:  Supple without lymphadenopathy. Carotids were 2+ and symmetric without bruits.  LUNGS:  Clear.  HEART:  Regular rate and rhythm.  ABDOMINAL/GENITALIA/RECTAL:  Not performed.  BACK:  Revealed negative straight leg raise signs with well healed surgical scar. There is no tenderness to percussion over the vertebrae.  EXTREMITIES:  No cyanosis, clubbing or edema with radial pulses and dorsalis pedis pulse 2+ and symmetric.  NEUROLOGIC:  The patient was oriented to person, place,  time, and reason for visit.  Cranial nerves II-XII are grossly intact. Deep tendon reflexes were symmetric in the upper extremities, 2+ at the knees, mute at the ankles with downgoing toes. Motor was 5/5 with symmetric bulk and tone. Sensation was intact to pinprick and vibratory sense. Coordination was grossly intact.  IMPRESSION: 1. Lumbar radiculopathy with underlying lumbar spondylosis status post    posterior decompression. 2. Other medical problems per Dr. Felipa Eth which include hypertension and renal    cyst.  DISPOSITION:  I discussed the potential risks, benefits and limitations of a lumbar epidural steroid injection in detail with the patient as well as the side effects of corticosteroids. He is interested in proceeding with this.  DESCRIPTION OF PROCEDURE:  After informed consent was obtained, the patient was placed in the sitting position and monitored. The patients back was prepped with Betadine x 3. A skin wheal was raised at the L4-5 interspace with 1 percent lidocaine. A 20 gauge Tuohy needle was introduced to the lumbar epidural space to loss of resistance to preservative free normal saline. There was no cerebrospinal fluid nor blood. The depth was 7 cm. I injected 80 mg of Medrol mixed with 8 ml of preservative free normal saline. The needle was flushed with preservative free normal saline and removed intact.  CONDITION POST PROCEDURE:  Stable.  DISCHARGE INSTRUCTIONS:  Resume previous diet. Limitations in activities per instruction sheet. Continue on current medications. Follow-up with me in 1-2 weeks for repeat injection. DD:  09/01/00 TD:  09/01/00 Job: [id number]

## 2010-07-27 NOTE — Cardiovascular Report (Signed)
[place]. [facility]  Patient:    Christopher [name], Christopher [name] Visit Number: [id number] MRN: [id number]          Service Type: MED Location: MICU 2111 01 Attending Physician:  Hoyle Sauer Dictated by:   Darden Palmer., M.D. Proc. Date: 05/04/01 Admit Date:  05/02/2001   CC:         Ravi R. Felipa Eth, M.D.  Cardiac Catheterization Laboratory   Cardiac Catheterization  HISTORY: A 77 year old male, with a history of diabetes, hypertension, mild hyperlipidemia, who presented with unstable angina pectoris and mild elevation of troponin.  COMMENTS ABOUT PROCEDURE: The patient tolerated the procedure well. After placement of the LV catheter into the ventricle, he developed atrial fibrillation with a controlled response. He was stable throughout this part of the procedure. Following the procedure, the artery underwent percutaneous suture mediated closure using the Perclose device without complications and with good hemostasis. Please see the attached catheterization log for the remainder of the details.  HEMODYNAMIC DATA: Aorta post contrast 100/65, LV post contrast 100/0-10.  ANGIOGRAPHIC DATA:  LEFT VENTRICULOGRAM: Left ventriculogram performed in the 30-degree RAO projection. The aortic valve was normal. The mitral valve was normal.  The left ventricle appears normal in size. The estimated ejection fraction was approximately 60%.  CORONARY ARTERIES: Arise and distribute normally.  Left main coronary artery appears diffusely narrowed at the proximal portion. There is an 80% proximal left main stenosis.  The left anterior descending has a severe segmental 95-99% stenosis with slight calcification, which is segmental just after the septal perforator. The distal vessel is large and appears suitable for grafting.  The circumflex coronary artery has two marginal branches which are large. There is mild to moderate irregularity noted in the  circumflex.  The right coronary artery is a dominant vessel but the posterior descending and posterolateral branch are somewhat small.  The right femoral artery had only minimal atherosclerotic irregularity noted.  IMPRESSION: 1. Significant left main and left anterior descending coronary artery    disease. 2. Normal left ventricular function. 3. Successful Perclose therapy.  RECOMMENDATIONS: Coronary artery bypass grafting. Dictated by:   Darden Palmer., M.D. Attending Physician:  Hoyle Sauer DD:  05/04/01 TD:  05/04/01 Job: [id number]

## 2010-07-27 NOTE — Op Note (Signed)
[place]. [facility]  Patient:    Christopher [name], OMLOR Visit Number: [id number] MRN: [id number]          Service Type: MED Location: 2300 [id number] [id number] Attending Physician:  Charlett Lango Dictated by:   Salvatore Decent. Dorris Fetch, M.D. Proc. Date: 05/05/01 Admit Date:  05/02/2001   CC:         Darden Palmer., M.D.  Ravi R. Felipa Eth, M.D.   Operative Report  PREOPERATIVE DIAGNOSIS:  Left main coronary disease with unstable angina.  POSTOPERATIVE DIAGNOSIS:  Left main coronary disease with unstable angina.  OPERATION:  Median sternotomy, extracorporeal circulation,  coronary artery bypass grafting x 3 (left internal mammary to left anterior descending, sequential saphenous vein graft to first obtuse marginal and second obtuse marginal).  SURGEON:  Salvatore Decent. Dorris Fetch, M.D.  ASSISTANT:  Adair Patter, P.A.  ANESTHESIA:  General.  FINDINGS:  The first diagonal was too small to graft.  Good quality targets. Left ventricular hypertrophy and dilatation.  Extensive epicardial fat.  INDICATIONS:  [name] is a 77 year old gentleman with newly diagnosed type 2 diabetes.  He presented with unstable angina.  He underwent cardiac catheterization which revealed a 70% left main stenosis as well as a 95% LAD stenosis.  The patient had a large right coronary artery but very small distal vessels with no hemodynamically significant stenoses.  The patient was referred for coronary artery bypass grafting.  The indications, risks, benefits and alternatives were discussed with the patient.  He understood and accepted the risks and agreed to proceed.  There was a discussion with an attempt for off pump bypass grafting if technically possible.  He understood if not technically possible we would proceed with standard bypass grafting with cardiopulmonary bypass.  DESCRIPTION OF PROCEDURE:  The patient was taken to the preoperative holding area on May 05, 2001.  Lines  were placed to monitor arterial, central, venous, pulmonary and arterial pressure.  Electrocardiogram leads were placed for continuous telemetry.  The patient was taken to the operating room, anesthetized and intubated.  A Foley catheter was placed.  Intravenous antibiotics were administered.  The chest, abdomen and legs were prepped and draped in the usual fashion.  A median sternotomy was performed.  The left internal mammary artery was harvested in the standard fashion.  It was a large good quality conduit. Simultaneously, an incision was made in the medial aspect of the left ankle and the greater saphenous vein was harvested from the ankle to the upper calf. The saphenous vein was a good quality vessel in the lower leg.  As it reached the upper calf it became small, sclerotic with multiple varicosities. However, this segment did not need to be used.  The patient was fully heparinized.  Prior to dividing the distal end of the mammary artery, there was good flow through the cut end of the vessel.  The pericardium was opened.  The ascending aorta was inspected and palpated.  It was free of atherosclerotic disease.  The patient was placed in steep Trendelenburg position and rotated to the right side.  On opening the pericardium it was noted that the heart was large with hypertrophy and dilatation.  It was covered extensively with epicardial fat which was very friable and tore easily.  The heart was gently lifted and an attempt was made to inspect the circumflex vessels to see if off pump grafting would be possible.  Because of the size of the heart as well as the extensive epicardial fat,  it was not felt to be technically safe to proceed with off pump grafting.  Therefore a decision was made to proceed with bypass grafting with cardiopulmonary bypass support.  The heart was placed back into its pericardium.  The ascending aorta was cannulated via concentric 2-0 Ethibond pledgeted  pursestring sutures.  The remainder of the full heparin dose was administered and the ACT was greater than 400.  Prior to cannulating the aorta, a dual stage venous cannula was placed through a pursestring suture in the right atrium.  Cardiopulmonary bypass was instituted and the patient was cooled to 32 degrees Celsius.  The coronary arteries and inspected, and anastomotic sites were chosen.  The conduits were inspected and cut to length. A foam pad was placed in the pericardium to protect the left phrenic nerve. The temperature probe was placed in the myocardial septum and the cardioplegia cannula was placed in the ascending aorta.  Of note, the right coronary distal vessels were all very small vessels.  The first diagonal was too small too graft.  The aorta was cross-clamped.  The left ventricle was entered via the aortic root and then cardiac arrest was achieved with a combination of topical iced saline and cold antegrade cardioplegia.  After administering 1 liter of cardioplegia, the myocardial septal temperature was 14 degrees Celsius.  The following distal anastomosis was performed.  First, a reverse saphenous vein graft was placed sequentially to the first and second obtuse marginal branches of the left circumflex coronary artery.  These vessels were both compromised by the 70-80% ostial left main stenosis.  There was some irregularity in the vessels, and there was palpable calcium in the areas of the irregularity.  Therefore, the vessels were grafted sequentially. A side-to-side anastomosis was performed to OM-I which was a 1.8 mm good quality vessel.  An end-to-side anastomosis was performed to OM-II which was a 1.5 mm good quality vessel.  Both were probed proximally and distally prior to tying the sutures.  Both were performed with running 7-0 Prolene sutures. There was excellent flow through the graft.  Cardioplegia was administered, and there was good hemostasis at both  anastomoses.   Next, the left internal mammary artery was brought through a window in the pericardium.  The distal end of the mammary artery was spatulated.  It was anastomosis end-to-side to the distal LAD with a running 8-0 Prolene suture. The LAD was a 1.5 mm good quality target.  The mammary was a 2 mm good quality conduit.  At the completion of the mammary to LAD anastomosis, the bulldog clamp was removed from the mammary artery.  Immediate and rapid septal rewarming was noted.  The bulldog clamp was replaced.  Additional cardioplegia was administered.  The vein graft was cut to length.  The cardioplegia cannula was removed from the ascending aorta, and the proximal vein graft anastomosis was performed to a 4.4 mm punch aortotomy with a running 6-0 Prolene suture.  The patient was placed in Trendelenburg position.  De-airing maneuvers were performed.  The bulldog clamp was removed from the mammary artery.  Once again there was immediate rapid septal rewarming.  Lidocaine was administered.  The aortic cross-clamp was removed.  The total cross-clamp time was 49 minutes.  The patient initially defibrillated and required a single defibrillation with 20 joules.  He then was in sinus bradycardia.  All proximal and distal anastomoses were inspected for hemostasis.  Epicardial pacing wires were placed on the right ventricle and right atrium.  After the  patient had been rewarmed to a core temperature of 37 degrees Celsius, he was weaned from cardiopulmonary bypass.  He was atrially paced for rate and on no inotropic support at the time of separation from bypass.  Total bypass time was 82 minutes.  The initial cardiac index was greater than 3 liters per minute per m sq, and the patient remained hemodynamically stable throughout the post bypass period.  A test dose of Protamine was administered and was well tolerated.   The atrial and aortic cannulae were removed. The remainder of the  Protamine was administered without incident.  The chest was irrigated with 1 liter of warm normal saline containing 1 g of vancomycin.  Hemostasis was achieved.  A left pleural and two mediastinal chest tubes were placed through separate subcostal incisions.  The pericardium could not be reapproximated, but the mediastinal fat was closed over the base of the heart and the ascending aorta.  The sternum was closed with heavy gauge interrupted stainless steel wires.  The pectoralis fascia was closed with a running #1 Vicryl suture in both the chest and the leg.  The subcutaneous tissue was closed with a running 2-0 Vicryl suture, and the skin was closed with a 3-0 Vicryl subcuticular suture.  All sponge, needle and instrument counts were correct at the end of the procedure. There were no intraoperative complications.  The patient was taken from the operating room to the surgical intensive care unit intubated and in stable condition. Dictated by:   Salvatore Decent Dorris Fetch, M.D. Attending Physician:  Charlett Lango DD:  05/05/01 TD:  05/05/01 Job: [id number]

## 2010-07-27 NOTE — H&P (Signed)
[place]. [facility]  Patient:    Christopher [name], Christopher [name] Visit Number: [id number] MRN: [id number]          Service Type: MED Location: MICU 2111 01 Attending Physician:  Hoyle Sauer Dictated by:   Pearletha Furl Jacky Kindle, M.D. Admit Date:  05/02/2001                           History and Physical  CHIEF COMPLAINT: Chest pain.  HISTORY OF PRESENT ILLNESS: Mr. Brauer is a 77 year old gentleman, with a history of essential hypertension and recently diagnosed diabetes mellitus, type 2, on diet alone, presenting at this time with several days of waxing and waning chest pain.  He has had no prior cardiac history, and did have a minor chest pain work-up about four years ago with a stress test that was reportedly negative.  Over the last two to three days he has had paroxysms of mid substernal heaviness and chest pain radiating to bilateral elbows.  Duration of each episode is approximately three to seven minutes, and it has resolved spontaneously until this morning when he presented to the emergency room with a more severe episode.  This chest pain is nonexertional and spontaneous, totally unpredictable.  He has had no shortness of breath, nausea or vomiting. He has had no cough or sputum.  He denies any indigestion.  At this time he is pain-free on IV nitroglycerin.  ALLERGIES: PENICILLIN.  MEDICATIONS:  1. Lotensin/HCT 20/12.5 mg q.d.  2. Glucosamine.  PAST MEDICAL HISTORY:  1. Essential hypertension.  2. Diabetes mellitus, type 2.  PAST SURGICAL HISTORY:  1. Lumbar laminectomy in November 2001.  2. Remote appendectomy.  FAMILY HISTORY: Noncontributory.  SOCIAL HISTORY: The patient is married, has three children of his own and three children of current wifes.  He does not smoke or drink.  He is a retired Journalist, newspaper.  PHYSICAL EXAMINATION:  VITAL SIGNS: Temperature 98 degrees, blood pressure 138/80, pulse 68, respiratory rate 18.  GENERAL: The  patient is pleasant, in no distress.  SKIN: Warm, nondiaphoretic.  HEENT: Anicteric.  Good facial symmetry.  PERRL.  NECK: Supple.  No JVD or bruits.  Carotids 2+/4+.  LUNGS: Clear lung fields to auscultation and percussion.  CARDIOVASCULAR: Regular rate and rhythm.  No murmur, gallop, rub, or heave. Chest wall nontender.  BREAST: Negative.  ABDOMEN: Soft, nontender.  No hepatosplenomegaly.  Bowel sounds normal.  GU/RECTAL: Examination not indicated.  NEUROLOGIC: Nonfocal.  High cortical functioning is intact.  LABORATORY DATA: Chest x-ray reveals subsegmental atelectasis, no acute infiltrate.  EKG reveals sinus bradycardia, occasional PACs; RSR prime in V1; nonspecific T changes but no acute changes.  ASSESSMENT:  1. Chest pain pattern compatible with new onset/unstable angina, rule out     myocardial infarction.  2. Essential hypertension.  3. Diabetes mellitus, type 2, diet controlled.  PLAN: The patient is to be admitted for anticoagulation, IV nitroglycerin, and cardiology consultation for probable catheterization. Dictated by:   Pearletha Furl Jacky Kindle, M.D. Attending Physician:  Hoyle Sauer DD:  05/02/01 TD:  05/03/01 Job: [id number]

## 2010-07-27 NOTE — Procedures (Signed)
[facility]  Patient:    Christopher [name], Christopher [name]                        MRN: [id number] Proc. Date: 09/09/00 Adm. Date:  [id number] Attending:  Thyra Breed CC:         Reinaldo Meeker, M.D.             Ravi R. Felipa Eth, M.D.                           Procedure Report  PROCEDURE PERFORMED:  Lumbar epidural steroid injection.  DIAGNOSIS:  Lumbar radiculopathy with underlying lumbar spondylosis.  INDICATIONS:  The patient has noted marked improvement after his first injection.  He has had minimal side effects with the first injection.  PHYSICAL EXAMINATION:  VITAL SIGNS:  Blood pressure 147/79, heart rate 62, and respiratory rate 18. Oxygen saturation 975.  Pain level is 2/10.  His back shows good healing.  DESCRIPTION OF PROCEDURE:  After informed consent was obtained, the patient was placed in the sitting position and monitored.  His back was prepped with Betadine x 3.  A skin wheel was raised at the L3-4 interspace with 1% lidocaine.  A 20 gauge Tuohy needle was introduced in the lumbar epidural space with loss of resistance to preservative-free normal saline.  There was no CSF or blood.  Medrol 80 mg and 8 ml of preservative-free normal saline was gently injected.  The needle was flushed and removed intact.  POSTPROCEDURE CONDITION:  Stable.  DISCHARGE INSTRUCTIONS: 1. Resume previous diet. 2. Limitation activities per instruction sheet. 3. Continue on current medications. 4. The patient is not scheduled to see Dr. Gerlene Fee for three    weeks and I advised that I would go ahead and schedule him    for another epidural in the interim, and if he would just    contact Dr. Despina Arias office and let him know that he is    having a positive response. DD:  09/09/00 TD:  09/09/00 Job: [id number]

## 2010-07-27 NOTE — Procedures (Signed)
[facility]  Patient:    Christopher [name], Christopher [name]                        MRN: [id number] Proc. Date: 09/18/00 Adm. Date:  [id number] Attending:  Thyra Breed CC:         Reinaldo Meeker, M.D.  Ravi R. Felipa Eth, M.D.   Procedure Report  PROCEDURE:  Lumbar epidural steroid injection.  DIAGNOSIS:  Lumbar radiculopathy with underlying lumbar spondylosis.  ANESTHESIOLOGIST:  Thyra Breed, M.D.  INTERVAL HISTORY:  The patient continues to note overall improvement.  He has had minimal side effects from the injections.  PHYSICAL EXAMINATION:  VITAL SIGNS:  The patients vital signs were stable.  Please see flow sheet for exact numbers.  BACK:  Shows good healing.  DESCRIPTION OF PROCEDURE:  After informed consent was obtained, the patient was placed in the sitting position and monitored.  His back was prepped with Betadine x 3.  A skin wheal was raised at the L3-4 interspace with 1% lidocaine.  A 20-gauge Tuohy needle was introduced in the lumbar epidural space to loss of resistance to preservative free normal saline.  There was no CSF nor blood.  The depth was 7 cm.  Medrol 80 mg mixed with 8 ml preservative free normal saline was gently injected.  The needle was flushed with preservative free normal saline and removed intact.  POSTPROCEDURE CONDITION:  Stable.  DISCHARGE INSTRUCTIONS: 1. Resume previous diet. 2. Limitation of activities per instruction sheet. 3. Continue on current medications. 4. The patient plans to see Dr. Gerlene Fee next week. DD:  09/18/00 TD:  09/18/00 Job: [id number]

## 2010-07-27 NOTE — Discharge Summary (Signed)
[facility]  Patient:    WINFIELD, CABA Visit Number: [id number] MRN: [id number]          Service Type: MED Location: 2000 2002 01 Attending Physician:  Charlett Lango Dictated by:   Maxwell Marion, RNFA Admit Date:  05/02/2001 Discharge Date: 05/09/2001   CC:         Lennon Alstrom. Felipa Eth, M.D.  Darden Palmer., M.D.   Discharge Summary  DATE OF BIRTH:  1926/10/03  ADMITTING DIAGNOSIS:  Chest pain.  PAST MEDICAL HISTORY: 1. Hypertension. 2. Recently diagnosed diabetes mellitus type 2. 3. Status post lumbar laminectomy in November 2001. 4. Remote history of tobacco use.  ALLERGIES:  PENICILLIN causes a rash.  This occurred over 40 years ago.  DISCHARGE DIAGNOSIS:  Left main coronary artery disease with unstable angina, status post coronary artery bypass grafting.  HISTORY OF PRESENT ILLNESS:  On February 22, Mr. Shipes, who was a 77 year old, Caucasian man, presented to [facility] Emergency Room with complaints of substernal chest pain.  He related a two- to three-day history of episodic, substernal chest heaviness and pain which radiated to his bilateral elbows. These episodes resolved spontaneously.  On the morning of his admission, the episode was more severe.  He was evaluated in the emergency room by Dr. Felipa Eth initially and had a cardiology consult by Dr. Donnie Aho.  HOSPITAL COURSE:  On admission, he was started on beta-blocker with MI workup including cardiac enzymes and EKGs.  He was scheduled for a cardiac catheterization on February 24.  His troponins came back mildly elevated with a peak of 0.49.  His creatinine is also elevated on admission at 1.4 and BUN 25.  Mucomyst and hydration were initiated per the catheterization.  On February 24, he underwent cardiac catheterization by Dr. Donnie Aho.  This remained left main and LAD disease with perserved left ventricular function. Cardiac surgery consult was obtained with  Dr. Charlett Lango after examination of the patient and review of the records including the cardiac catheterization and he recommended coronary artery bypass grafting as the best treatment option for this patient.  Preoperative Doppler studies were performed.  There was no significant carotid artery disease.  His ABIs were noted to be greater than 1.0 bilaterally.  On February 25, Mr. Kunkle underwent an uncomplicated coronary artery bypass graft x2 with Dr. Charlett Lango.  Grafts placed at the the of procedure were left internal mammary artery grafted to the left anterior descending, saphenous vein graft grafted in the sequential fashion to the obtuse marginal-1 and obtuse marginal-2 arteries.  The vein was taken for the bypass from the left lower extremity.  He tolerated this procedure well and was transferred in stable condition to the SICU.  He has remained hemodynamically stable throughout his postoperative recovery. His postoperative course was notable for brief episode of atrial fibrillation on postop day #1.  He returned to normal sinus rhythm spontaneously and there has been no further recurrence.  His CBGs have been running 124 to 180s consistently.  Oral agent has been initiated by Dr. Felipa Eth.  His creatinine was elevated to a maximum of 1.5 on postop day #2.  Mr. Armijo is making very good progress in recovering from his surgery and he was ready for discharge home today, May 09, 2001.  LABORATORY DATA AND X-RAY FINDINGS:  Postop day #4, March 1, sodium was 138, potassium 4.1, BUN 20, creatinine 1.5, glucose 111.  CONDITION ON DISCHARGE:  Improved.  SPECIAL INSTRUCTIONS:  Instructions on discharge include medications, activity, diet, wound care and follow-up appointments.  Please see the discharge instruction sheet for details.  He is instructed to check his CBGs at home daily and record.  DISCHARGE MEDICATIONS: 1. Enteric coated aspirin 325 mg p.o. q.d. 2. Tylox  one to two p.o. q.4-6h. p.r.n. pain. 3. Toprol XL 25 mg p.o. q.d. 4. Lasix 40 mg p.o. q.d. x7 days. 5. Potassium chloride 20 mEq p.o. q.d. x7 days. 6. Amaryl 1 mg p.o. q.d. 7. Lotensin 20 mg p.o. q.d. 8. Keflex 500 mg p.o. t.i.d. x5 days.  FOLLOWUP:  Follow up with Dr. Felipa Eth in approximately two to three weeks. Dr. Donnie Aho would like to see him in the office in approximately two weeks.  He has been asked to call and arrange this appointment.  He has an appointment to see Dr. Charlett Lango at the [facility] office on March 26, at 10 a.m. Dictated by:   Maxwell Marion, RNFA Attending Physician:  Charlett Lango DD:  05/09/01 TD:  05/11/01 Job: [id number]

## 2010-07-31 NOTE — Op Note (Signed)
  NAME:  SEBERT, STOLLINGS NO.:  [id number]  MEDICAL RECORD NO.:  [id number]           PATIENT TYPE:  O  LOCATION:  6527                         FACILITY:  [facility]  PHYSICIAN:  Duke Salvia, MD, FACCDATE OF BIRTH:  Feb 27, 1927  DATE OF PROCEDURE: DATE OF DISCHARGE:                              OPERATIVE REPORT   PREOPERATIVE DIAGNOSIS:  Symptomatic sinus node dysfunction.  POSTOPERATIVE DIAGNOSIS:  Symptomatic sinus node dysfunction.  PROCEDURE:  Dual-chamber pacemaker implantation.  Following obtaining informed consent, the patient was brought to the electrophysiology laboratory and placed on the fluoroscopic table in supine position.  After routine prep and drape of the left upper chest, lidocaine was infiltrated in the prepectoral subclavicular region. Incision was made and carried down to the layer of the prepectoral fascia using electrocautery and sharp dissection.  A pocket was performed similarly.  Hemostasis was obtained.   Thereafter, attention was turned to gain access to extrathoracic left subclavian vein, which was accomplished without difficulty without the aspiration of air or puncture of the artery.  Two venipunctures were accomplished.  Guidewires were placed and retained and sequentially, 7- French sheaths were placed, which were passed a Medtronic 5076, 58-cm active fixation ventricular lead, serial number [id number] and a 52, 5076 lead Medtronic serial number [id number].  Under fluoroscopic guidance, these were manipulated to the right ventricular apex from the right atrial appendage respectively where bipolar R-wave was 7.7 with a pace impedance of 1801, threshold 1.2 volts at 0.5 milliseconds. Current of threshold was 0.6 mA.  There is no diaphragmatic pacing at 10 volts, and the current of injury was brisk.  This lead was marked with a tie.  Bipolar P-wave was 1.1 with pace impedance of 953, threshold 0.6-0.5, current of threshold 1.5  mA.  There is no diaphragmatic pacing at 10 volts, and the current of injury was brisk.  With these acceptable parameters recorded, the leads were secured to the prepectoral fascia and then attached to Medtronic Adapta RL pulse generator, serial number [id number] is in toto.  Ventricular pacing and AV pacing were identified.  The pocket was copiously irrigated with antibiotic containing saline solution.  The leads and pulse generator were placed in the pocket secured to the prepectoral fascia.  The wound was closed in three layers in normal fashion.  The wound was washed, dried, and Benzoin, Steri-Strip dressing was applied.  Needle counts, sponge counts, and instrument counts were correct at the end of procedure according to the staff.  The patient tolerated the procedure according to staff.  There were no apparent complications.     Duke Salvia, MD, [facility]     SCK/MEDQ  D:  07/26/2010  T:  07/26/2010  Job:  [id number]  Electronically Signed by Sherryl Manges MD [facility] on 07/31/2010 04:25:31 PM

## 2010-08-02 ENCOUNTER — Other Ambulatory Visit (INDEPENDENT_AMBULATORY_CARE_PROVIDER_SITE_OTHER): Payer: Medicare Other | Admitting: *Deleted

## 2010-08-02 DIAGNOSIS — N179 Acute kidney failure, unspecified: Secondary | ICD-10-CM

## 2010-08-02 LAB — BASIC METABOLIC PANEL
CO2: 24 mEq/L (ref 19–32)
Chloride: 111 mEq/L (ref 96–112)
GFR: 42.4 mL/min — ABNORMAL LOW (ref >60.00)
Glucose, Bld: 116 mg/dL — ABNORMAL HIGH (ref 70–99)
Potassium: 4.7 mEq/L (ref 3.5–5.1)
Sodium: 140 mEq/L (ref 135–145)

## 2010-08-03 NOTE — Discharge Summary (Addendum)
NAME:  Christopher [name], Christopher [name] NO.:  [id number]  MEDICAL RECORD NO.:  [id number]           PATIENT TYPE:  O  LOCATION:  6527                         FACILITY:  [facility]  PHYSICIAN:  Hillis Range, MD       DATE OF BIRTH:  1927/01/20  DATE OF ADMISSION:  07/26/2010 DATE OF DISCHARGE:  07/27/2010                              DISCHARGE SUMMARY   DISCHARGE DIAGNOSES: 1. Sinus node dysfunction with intermittent atrioventricular     dissociation with accelerated junctional rhythm with exercise. 2. Late status post dual-chamber Medtronic pacemaker implanted, Jul 26, 2010, by Christopher [name]. 3. Coronary artery disease status post coronary artery bypass graft in     2003. 4. Lumbar disk disease status post laminectomy. 5. Benign prostatic hypertrophy. 6. Hypertension. 7. Type 2 diabetes. 8. Appendectomy.  HOSPITAL COURSE:  Christopher [name] is an 77 year old gentleman who is typically followed by Christopher [name] for CAD.  He was referred to Christopher [name] for symptomatic exercise intolerance attributed to intermittent junctional rhythm.  He underwent testing by Christopher [name] suggesting the junctional rhythm and sinus nodes pulling.  Treadmill testing repeated and nothing was seen.  In that record, I raised the issue again and at that point, cardiopulmonary stress testing was undertaken, which demonstrates possible ventilatory defect.  However, there was also an isorhythmic AV dissociation with accelerated junctional rhythm seen at peak exercise that correlated with symptoms.  Christopher [name] reviewed the data, and felt that the patient would benefit from the role of pacing to help with his symptoms.  The patient agreed and he was brought into the hospital on Jul 26, 2010, to have this procedure.  He ultimately had successful dual- chamber Medtronic pacemaker implantation done.  The patient tolerated the procedure well.  Followup chest x-ray this morning demonstrates no pneumothorax.  Christopher [name]  has seen and examined him today and feels he is stable for discharge.  Of note, his potassium yesterday was 5.3 and creatinine was 1.66.  I discussed this with Christopher [name] who does not want to make any changes to his medications right now, but want the patient to have an outpatient BMET next week, which we arranged as below.  DISCHARGE LABORATORY DATA:  Sodium 139, potassium 5.3, chloride 110, CO2 23, glucose 101, BUN 38, creatinine 1.66.  STUDIES: 1. Chest x-ray on Jul 27, 2010, demonstrated new pacemaker with leads     overlying right atrium, right ventricle.  No pneumothorax or     pulmonary edema. 2. OP report for dual-chamber pacemaker implantation, Jul 26, 2010,     please see full report for details.  DISCHARGE MEDICATIONS: 1. Amlodipine 10 mg daily. 2. Aspirin 81 mg daily. 3. Benazepril 40 mg daily. 4. Finasteride 5 mg daily. 5. Fish oil 1000 mg t.i.d. 6. Folic acid 0.4 mg daily. 7. Glimepiride 1 mg daily. 8. __________ extract OTC 1 tablet b.i.d. 9. Multivitamin 1 tablet daily. 10.Triamterene and hydrochlorothiazide 37.5/25 mg half tablet daily. 11.Glucosamine/chondroitin OTC 1 tablet b.i.d.  DISCHARGE DISPOSITION:  Christopher [name] will be discharged in stable condition to home.  He was  given a copy of the pacemaker discharge instruction sheet with specific instructions regarding wound care, driving, activity, bathing, and wound precautions.  He is to follow a heart- healthy, low-sodium diabetic diet.  He will return on Aug 02, 2010, at 9:40 a.m. at Christopher [name] office for rechecking his CMET given his abnormal creatinine and potassium per Christopher [name].  He will follow up with the [facility] on August 15, 2010, at 3 p.m. and then follow up with Christopher [name] on October 30, 2010, at 2:45 p.m.  DURATION OF DISCHARGE ENCOUNTER:  Greater than 30 minutes including physician and PA time.     Ronie Spies, P.A.C.   ______________________________ Hillis Range,  MD    DD/MEDQ  D:  07/27/2010  T:  07/28/2010  Job:  [id number]  cc:   Christopher Salvia, MD, Christopher [name], M.D.  Electronically Signed by Hillis Range MD on 08/03/2010 05:53:58 PM Electronically Signed by Ronie Spies  on 08/10/2010 01:39:53 PM MedRecNo: [id number] [facility], Account: [id number], DocSeq: [id number] To clarify on previously dictated dc summary, he is on Horse chestnut extract OTC 1 tab bid. Electronically Signed by Ronie Spies  on 08/10/2010 01:39:44 PM

## 2010-08-15 ENCOUNTER — Ambulatory Visit (INDEPENDENT_AMBULATORY_CARE_PROVIDER_SITE_OTHER): Payer: Medicare Other | Admitting: *Deleted

## 2010-08-15 DIAGNOSIS — I495 Sick sinus syndrome: Secondary | ICD-10-CM

## 2010-08-15 NOTE — Progress Notes (Signed)
Wound check pacer 

## 2010-10-05 ENCOUNTER — Encounter: Payer: Self-pay | Admitting: Internal Medicine

## 2010-10-30 ENCOUNTER — Ambulatory Visit (INDEPENDENT_AMBULATORY_CARE_PROVIDER_SITE_OTHER): Payer: Medicare Other | Admitting: Internal Medicine

## 2010-10-30 ENCOUNTER — Encounter: Payer: Self-pay | Admitting: Internal Medicine

## 2010-10-30 DIAGNOSIS — I495 Sick sinus syndrome: Secondary | ICD-10-CM

## 2010-10-30 DIAGNOSIS — R0602 Shortness of breath: Secondary | ICD-10-CM

## 2010-10-30 DIAGNOSIS — Z950 Presence of cardiac pacemaker: Secondary | ICD-10-CM | POA: Insufficient documentation

## 2010-10-30 LAB — PACEMAKER DEVICE OBSERVATION
ATRIAL PACING PM: 33
BAMS-0001: 150 {beats}/min
BATTERY VOLTAGE: 2.79 V
RV LEAD THRESHOLD: 0.5 V
VENTRICULAR PACING PM: 0

## 2010-10-30 NOTE — Assessment & Plan Note (Signed)
Largely resolved.  

## 2010-10-30 NOTE — Assessment & Plan Note (Signed)
Much improved following pacemaker implantation. His device was reprogrammed to decrease his ADL response. His lower rate limit was increased from 40-50

## 2010-10-30 NOTE — Assessment & Plan Note (Signed)
The patient's device was interrogated and the information was fully reviewed.  The device was reprogrammed to maximize longevity and adjust rates and rate response as noted above

## 2010-10-30 NOTE — Progress Notes (Signed)
  HPI  Christopher [name] is a 77 y.o. male See in followup for pacemaker implanted for exercise intolerance that was attributed to sinus node dysfunction and associated junctional rhythm at higher heart rates.  He is much improved vis--vis exercise tolerance and energy.   Past Medical History  Diagnosis Date  . CAD (coronary artery disease)   . Lumbar disc disease   . BPH (benign prostatic hyperplasia)   . HTN (hypertension)   . Diabetes mellitus type II     Past Surgical History  Procedure Date  . Coronary artery bypass graft 2003    x3  . Cardiac catheterization   . Lumbar laminectomy 11/01  . Appendectomy     Current Outpatient Prescriptions  Medication Sig Dispense Refill  . amLODipine (NORVASC) 10 MG tablet Take 10 mg by mouth daily.        Marland Kitchen aspirin 81 MG tablet Take 81 mg by mouth daily.        . benazepril (LOTENSIN) 40 MG tablet Take 40 mg by mouth daily.        . finasteride (PROSCAR) 5 MG tablet Take 5 mg by mouth daily.        . folic acid (FOLVITE) 400 MCG tablet Take 400 mcg by mouth daily.        Marland Kitchen glimepiride (AMARYL) 1 MG tablet Take 1 mg by mouth daily before breakfast.        . Glucosamine HCl 1500 MG TABS Take 2 tablets by mouth daily.        . Horse Chestnut 300 MG CAPS Take 2 capsules by mouth daily.        . Multiple Vitamins-Minerals (MULTIVITAL-M) TABS daily.        . Omega-3 Fatty Acids (FISH OIL) 1000 MG CAPS Take 4 capsules by mouth daily.        Marland Kitchen triamterene-hydrochlorothiazide (MAXZIDE-25) 37.5-25 MG per tablet Take 0.5 tablets by mouth daily.          Allergies  Allergen Reactions  . Penicillins     Review of Systems negative except from HPI and PMH  Physical Exam Well developed and well nourished in no acute distress HENT normal E scleral and icterus clear Neck Supple Pacer pocket well healed Clear to ausculation Regular rate and rhythm, no murmurs gallops or rub No clubbing cyanosis and edema Alert and oriented, grossly normal  motor and sensory function Skin Warm and Dry   Assessment and  Plan f

## 2011-03-13 DIAGNOSIS — R002 Palpitations: Secondary | ICD-10-CM | POA: Diagnosis not present

## 2011-03-13 DIAGNOSIS — Z951 Presence of aortocoronary bypass graft: Secondary | ICD-10-CM | POA: Diagnosis not present

## 2011-03-13 DIAGNOSIS — E785 Hyperlipidemia, unspecified: Secondary | ICD-10-CM | POA: Diagnosis not present

## 2011-03-13 DIAGNOSIS — I2510 Atherosclerotic heart disease of native coronary artery without angina pectoris: Secondary | ICD-10-CM | POA: Diagnosis not present

## 2011-03-13 DIAGNOSIS — Z79899 Other long term (current) drug therapy: Secondary | ICD-10-CM | POA: Diagnosis not present

## 2011-03-13 DIAGNOSIS — I10 Essential (primary) hypertension: Secondary | ICD-10-CM | POA: Diagnosis not present

## 2011-03-13 DIAGNOSIS — Z950 Presence of cardiac pacemaker: Secondary | ICD-10-CM | POA: Diagnosis not present

## 2011-03-20 ENCOUNTER — Other Ambulatory Visit: Payer: Self-pay | Admitting: Dermatology

## 2011-03-20 DIAGNOSIS — L570 Actinic keratosis: Secondary | ICD-10-CM | POA: Diagnosis not present

## 2011-03-20 DIAGNOSIS — C44319 Basal cell carcinoma of skin of other parts of face: Secondary | ICD-10-CM | POA: Diagnosis not present

## 2011-03-20 DIAGNOSIS — E782 Mixed hyperlipidemia: Secondary | ICD-10-CM | POA: Diagnosis not present

## 2011-03-20 DIAGNOSIS — C44320 Squamous cell carcinoma of skin of unspecified parts of face: Secondary | ICD-10-CM | POA: Diagnosis not present

## 2011-04-11 DIAGNOSIS — C44320 Squamous cell carcinoma of skin of unspecified parts of face: Secondary | ICD-10-CM | POA: Diagnosis not present

## 2011-04-16 ENCOUNTER — Other Ambulatory Visit: Payer: Self-pay | Admitting: Dermatology

## 2011-04-16 DIAGNOSIS — C44319 Basal cell carcinoma of skin of other parts of face: Secondary | ICD-10-CM | POA: Diagnosis not present

## 2011-04-16 DIAGNOSIS — D044 Carcinoma in situ of skin of scalp and neck: Secondary | ICD-10-CM | POA: Diagnosis not present

## 2011-04-16 DIAGNOSIS — C4442 Squamous cell carcinoma of skin of scalp and neck: Secondary | ICD-10-CM | POA: Diagnosis not present

## 2011-04-23 DIAGNOSIS — C4442 Squamous cell carcinoma of skin of scalp and neck: Secondary | ICD-10-CM | POA: Diagnosis not present

## 2011-04-23 DIAGNOSIS — B351 Tinea unguium: Secondary | ICD-10-CM | POA: Diagnosis not present

## 2011-05-07 DIAGNOSIS — E785 Hyperlipidemia, unspecified: Secondary | ICD-10-CM | POA: Diagnosis not present

## 2011-05-07 DIAGNOSIS — E1159 Type 2 diabetes mellitus with other circulatory complications: Secondary | ICD-10-CM | POA: Diagnosis not present

## 2011-05-07 DIAGNOSIS — I10 Essential (primary) hypertension: Secondary | ICD-10-CM | POA: Diagnosis not present

## 2011-05-07 DIAGNOSIS — I2510 Atherosclerotic heart disease of native coronary artery without angina pectoris: Secondary | ICD-10-CM | POA: Diagnosis not present

## 2011-05-07 DIAGNOSIS — Z950 Presence of cardiac pacemaker: Secondary | ICD-10-CM | POA: Diagnosis not present

## 2011-05-07 DIAGNOSIS — Z951 Presence of aortocoronary bypass graft: Secondary | ICD-10-CM | POA: Diagnosis not present

## 2011-05-07 DIAGNOSIS — Z79899 Other long term (current) drug therapy: Secondary | ICD-10-CM | POA: Diagnosis not present

## 2011-05-22 ENCOUNTER — Encounter: Payer: Self-pay | Admitting: Internal Medicine

## 2011-06-12 DIAGNOSIS — E785 Hyperlipidemia, unspecified: Secondary | ICD-10-CM | POA: Diagnosis not present

## 2011-06-12 DIAGNOSIS — I2510 Atherosclerotic heart disease of native coronary artery without angina pectoris: Secondary | ICD-10-CM | POA: Diagnosis not present

## 2011-06-12 DIAGNOSIS — I10 Essential (primary) hypertension: Secondary | ICD-10-CM | POA: Diagnosis not present

## 2011-06-12 DIAGNOSIS — E1159 Type 2 diabetes mellitus with other circulatory complications: Secondary | ICD-10-CM | POA: Diagnosis not present

## 2011-06-12 DIAGNOSIS — Z951 Presence of aortocoronary bypass graft: Secondary | ICD-10-CM | POA: Diagnosis not present

## 2011-06-12 DIAGNOSIS — Z950 Presence of cardiac pacemaker: Secondary | ICD-10-CM | POA: Diagnosis not present

## 2011-06-12 DIAGNOSIS — Z79899 Other long term (current) drug therapy: Secondary | ICD-10-CM | POA: Diagnosis not present

## 2011-06-14 DIAGNOSIS — R6884 Jaw pain: Secondary | ICD-10-CM | POA: Diagnosis not present

## 2011-06-14 DIAGNOSIS — N182 Chronic kidney disease, stage 2 (mild): Secondary | ICD-10-CM | POA: Diagnosis not present

## 2011-06-14 DIAGNOSIS — M545 Low back pain: Secondary | ICD-10-CM | POA: Diagnosis not present

## 2011-06-14 DIAGNOSIS — E119 Type 2 diabetes mellitus without complications: Secondary | ICD-10-CM | POA: Diagnosis not present

## 2011-06-25 ENCOUNTER — Other Ambulatory Visit: Payer: Self-pay | Admitting: Cardiology

## 2011-06-25 ENCOUNTER — Ambulatory Visit
Admission: RE | Admit: 2011-06-25 | Discharge: 2011-06-25 | Disposition: A | Discharge status: Home / Self Care | Payer: Medicare Other | Source: Ambulatory Visit | Attending: Cardiology | Admitting: Cardiology

## 2011-06-25 DIAGNOSIS — I517 Cardiomegaly: Secondary | ICD-10-CM | POA: Diagnosis not present

## 2011-06-25 DIAGNOSIS — Z79899 Other long term (current) drug therapy: Secondary | ICD-10-CM | POA: Diagnosis not present

## 2011-06-25 DIAGNOSIS — I4891 Unspecified atrial fibrillation: Secondary | ICD-10-CM

## 2011-06-25 DIAGNOSIS — Z951 Presence of aortocoronary bypass graft: Secondary | ICD-10-CM | POA: Diagnosis not present

## 2011-06-25 DIAGNOSIS — R0789 Other chest pain: Secondary | ICD-10-CM | POA: Diagnosis not present

## 2011-06-25 DIAGNOSIS — I2510 Atherosclerotic heart disease of native coronary artery without angina pectoris: Secondary | ICD-10-CM | POA: Diagnosis not present

## 2011-06-25 DIAGNOSIS — Z7901 Long term (current) use of anticoagulants: Secondary | ICD-10-CM | POA: Diagnosis not present

## 2011-06-25 DIAGNOSIS — E785 Hyperlipidemia, unspecified: Secondary | ICD-10-CM | POA: Diagnosis not present

## 2011-06-25 DIAGNOSIS — I10 Essential (primary) hypertension: Secondary | ICD-10-CM | POA: Diagnosis not present

## 2011-06-27 ENCOUNTER — Other Ambulatory Visit: Payer: Self-pay | Admitting: Cardiology

## 2011-07-02 DIAGNOSIS — E119 Type 2 diabetes mellitus without complications: Secondary | ICD-10-CM | POA: Diagnosis not present

## 2011-07-02 DIAGNOSIS — H11009 Unspecified pterygium of unspecified eye: Secondary | ICD-10-CM | POA: Diagnosis not present

## 2011-07-02 DIAGNOSIS — H532 Diplopia: Secondary | ICD-10-CM | POA: Diagnosis not present

## 2011-07-02 DIAGNOSIS — H52209 Unspecified astigmatism, unspecified eye: Secondary | ICD-10-CM | POA: Diagnosis not present

## 2011-07-03 DIAGNOSIS — Z950 Presence of cardiac pacemaker: Secondary | ICD-10-CM | POA: Diagnosis not present

## 2011-07-03 DIAGNOSIS — Z7901 Long term (current) use of anticoagulants: Secondary | ICD-10-CM | POA: Diagnosis not present

## 2011-07-03 DIAGNOSIS — E785 Hyperlipidemia, unspecified: Secondary | ICD-10-CM | POA: Diagnosis not present

## 2011-07-03 DIAGNOSIS — Z951 Presence of aortocoronary bypass graft: Secondary | ICD-10-CM | POA: Diagnosis not present

## 2011-07-03 DIAGNOSIS — I2510 Atherosclerotic heart disease of native coronary artery without angina pectoris: Secondary | ICD-10-CM | POA: Diagnosis not present

## 2011-07-03 DIAGNOSIS — I4891 Unspecified atrial fibrillation: Secondary | ICD-10-CM | POA: Diagnosis not present

## 2011-07-03 DIAGNOSIS — I10 Essential (primary) hypertension: Secondary | ICD-10-CM | POA: Diagnosis not present

## 2011-07-10 DIAGNOSIS — I4891 Unspecified atrial fibrillation: Secondary | ICD-10-CM | POA: Diagnosis not present

## 2011-07-10 DIAGNOSIS — I10 Essential (primary) hypertension: Secondary | ICD-10-CM | POA: Diagnosis not present

## 2011-07-10 DIAGNOSIS — I2510 Atherosclerotic heart disease of native coronary artery without angina pectoris: Secondary | ICD-10-CM | POA: Diagnosis not present

## 2011-07-10 DIAGNOSIS — Z950 Presence of cardiac pacemaker: Secondary | ICD-10-CM | POA: Diagnosis not present

## 2011-07-10 DIAGNOSIS — Z7901 Long term (current) use of anticoagulants: Secondary | ICD-10-CM | POA: Diagnosis not present

## 2011-07-10 DIAGNOSIS — Z951 Presence of aortocoronary bypass graft: Secondary | ICD-10-CM | POA: Diagnosis not present

## 2011-07-10 DIAGNOSIS — E785 Hyperlipidemia, unspecified: Secondary | ICD-10-CM | POA: Diagnosis not present

## 2011-07-20 ENCOUNTER — Other Ambulatory Visit: Payer: Self-pay | Admitting: Cardiology

## 2011-07-25 DIAGNOSIS — I4891 Unspecified atrial fibrillation: Secondary | ICD-10-CM | POA: Diagnosis not present

## 2011-07-25 DIAGNOSIS — E785 Hyperlipidemia, unspecified: Secondary | ICD-10-CM | POA: Diagnosis not present

## 2011-07-25 DIAGNOSIS — Z951 Presence of aortocoronary bypass graft: Secondary | ICD-10-CM | POA: Diagnosis not present

## 2011-07-25 DIAGNOSIS — I2510 Atherosclerotic heart disease of native coronary artery without angina pectoris: Secondary | ICD-10-CM | POA: Diagnosis not present

## 2011-07-25 DIAGNOSIS — Z7901 Long term (current) use of anticoagulants: Secondary | ICD-10-CM | POA: Diagnosis not present

## 2011-07-25 DIAGNOSIS — I10 Essential (primary) hypertension: Secondary | ICD-10-CM | POA: Diagnosis not present

## 2011-07-25 DIAGNOSIS — Z950 Presence of cardiac pacemaker: Secondary | ICD-10-CM | POA: Diagnosis not present

## 2011-07-25 DIAGNOSIS — Z79899 Other long term (current) drug therapy: Secondary | ICD-10-CM | POA: Diagnosis not present

## 2011-08-01 ENCOUNTER — Encounter (HOSPITAL_COMMUNITY): Payer: Self-pay | Admitting: Pharmacy Technician

## 2011-08-06 DIAGNOSIS — E785 Hyperlipidemia, unspecified: Secondary | ICD-10-CM | POA: Diagnosis not present

## 2011-08-06 DIAGNOSIS — Z7901 Long term (current) use of anticoagulants: Secondary | ICD-10-CM | POA: Diagnosis not present

## 2011-08-06 DIAGNOSIS — I2510 Atherosclerotic heart disease of native coronary artery without angina pectoris: Secondary | ICD-10-CM | POA: Diagnosis not present

## 2011-08-06 DIAGNOSIS — Z950 Presence of cardiac pacemaker: Secondary | ICD-10-CM | POA: Diagnosis not present

## 2011-08-06 DIAGNOSIS — Z79899 Other long term (current) drug therapy: Secondary | ICD-10-CM | POA: Diagnosis not present

## 2011-08-06 DIAGNOSIS — I10 Essential (primary) hypertension: Secondary | ICD-10-CM | POA: Diagnosis not present

## 2011-08-06 DIAGNOSIS — Z951 Presence of aortocoronary bypass graft: Secondary | ICD-10-CM | POA: Diagnosis not present

## 2011-08-06 DIAGNOSIS — I4891 Unspecified atrial fibrillation: Secondary | ICD-10-CM | POA: Diagnosis not present

## 2011-08-07 ENCOUNTER — Other Ambulatory Visit: Payer: Self-pay | Admitting: Cardiology

## 2011-08-07 DIAGNOSIS — E785 Hyperlipidemia, unspecified: Secondary | ICD-10-CM | POA: Insufficient documentation

## 2011-08-07 DIAGNOSIS — M519 Unspecified thoracic, thoracolumbar and lumbosacral intervertebral disc disorder: Secondary | ICD-10-CM | POA: Insufficient documentation

## 2011-08-07 DIAGNOSIS — I119 Hypertensive heart disease without heart failure: Secondary | ICD-10-CM | POA: Insufficient documentation

## 2011-08-07 DIAGNOSIS — Z7901 Long term (current) use of anticoagulants: Secondary | ICD-10-CM | POA: Insufficient documentation

## 2011-08-07 DIAGNOSIS — N185 Chronic kidney disease, stage 5: Secondary | ICD-10-CM | POA: Insufficient documentation

## 2011-08-07 DIAGNOSIS — I4891 Unspecified atrial fibrillation: Secondary | ICD-10-CM

## 2011-08-07 DIAGNOSIS — I480 Paroxysmal atrial fibrillation: Secondary | ICD-10-CM | POA: Insufficient documentation

## 2011-08-07 DIAGNOSIS — N400 Enlarged prostate without lower urinary tract symptoms: Secondary | ICD-10-CM | POA: Insufficient documentation

## 2011-08-07 HISTORY — DX: Unspecified atrial fibrillation: I48.91

## 2011-08-07 NOTE — H&P (Signed)
Webb [facility] C  Date of visit:  08/06/2011 DOB:  1926-10-18    Age:  78 yrs. Medical record number:  [id number]     Account number:  [id number] Primary Care Provider: Chilton Greathouse R ____________________________ CURRENT DIAGNOSES  1. Atrial fibrillation  2. CAD,Native  3. Long-term (current) Use Of Other Medications  4. Chronic Kidney Disease (Stage 3)  5. Diabetes Mellitus-nidd/circ Dis/controlled  6. Hyperlipidemia  7. Hypertension-essential (benign)  8. Long Term Use Anticoagulant  9. Surgery-Aortocoronary Bypass Grafting  10. Pacemaker/cardiac insitu ____________________________ ALLERGIES  Crestor, Aches  Lipitor, Joint aches  pravastatin, Joint aches ____________________________ MEDICATIONS  1. amlodipine 10 mg Tablet, 1 p.o. daily  2. Fish Oil 1,000 mg Capsule, BID  3. glimepiride 1 mg Tablet, 1 p.o. daily  4. finasteride 5 mg Tablet, 1 p.o. daily  5. folic acid 400 mcg Tablet, 1 p.o. q.d.  6. Aspirin Low Dose 81 mg Tablet, Delayed Release (E.C.), 1 p.o. q.d.  7. horse chestnut 300 mg Capsule, 1 p.o. daily  8. multivitamin Capsule, 1 p.o. q.d.  9. Glucosamine 500 mg Tablet, BID  10. atenolol 25 mg tablet, 1 p.o. daily  11. nitroglycerin 0.4 mg tablet, sublingual, PRN  12. warfarin 5 mg tablet, 1 p.o. daily  13. benazepril 40 mg tablet, 1 p.o. daily  14. triamterene-hydrochlorothiazid 37.5-25 mg tablet, 1/2 tab daily ____________________________ CHIEF COMPLAINTS  Followup of Atrial fibrillation ____________________________ HISTORY OF PRESENT ILLNESS   Patient seen for evaluation prior to cardioversion. He has a history of coronary artery disease with previous bypass grafting and his grafts were patent in 2011. He developed a accelerated junctional rhythm with loss of atrial contractility and had a pacemaker implantation in May of 2012. He has noted recent fatigue and was found to be in atrial fibrillation by pacemaker examination earlier this year. He has continued in  atrial fibrillation and his complaint of modest fatigue. He does have some chronic renal insufficiency and cardioversion is recommended to help improve his symptoms. He has been anticoagulated with warfarin for over one month. He denies angina and has no PND, orthopnea or edema.____________________________ PAST HISTORY  Past Medical Illnesses:  hypertension, DM-non-insulin dependent, hyperlipidemia, lumbar disc disease, BPH;  Cardiovascular Illnesses:  CAD, sinus node dysfunction;  Infectious Diseases:  no previous history of significant infectious diseases;  Surgical Procedures:  CABG w LIMA to LAD, SVG to OM-OM2 05/05/01 [place];  Trauma History:  no previous history of significant trauma;  NYHA Classification:  II;  Cardiology Procedures-Invasive:  cardiac cath (left) October 2011, Medtronic pacemaker implant May 2012;  Cardiology Procedures-Noninvasive:  treadmill cardiolite October 2010, event monitor September 2011, echocardiogram April 2013;  Cardiac Cath Results:  60% left main, occluded  proximal LAD, widely patent OM 1 OM 2 SVG, widely patent RCA, widely patent LAD LIMA graft;  LVEF of 50% documented via echocardiogram on 07/03/2011  CHADS Score:  3 ____________________________ CARDIO-PULMONARY TEST DATES EKG Date:  08/06/2011;   Cardiac Cath Date:  12/20/2009;  CABG: 05/05/2001;  Holter/Event Monitor Date: 11/24/2009;  Nuclear Study Date:  12/29/2008;  Echocardiography Date: 07/03/2011;  Chest Xray Date: 06/25/2011;   ____________________________ REVIEW OF SYSTEMS General:  malaise and fatigue  Integumentary:  easy bruisability  Eyes:  cataract extraction bilaterally, wears eye glasses/contact lenses  Respiratory:  mild dyspnea with exertion  Cardiovascular:  please review HPI  Abdominal:  denies dyspepsia, GI bleeding, constipation, or diarrhea  Genitourinary-Male:  nocturia Musculoskeletal:  mild chronic low back pain, sciatica ____________________________ PHYSICAL EXAMINATION VITAL  SIGNS  Blood Pressure:  114/70 Sitting, Right arm, regular cuff  , 110/68 Standing, Right arm and regular cuff   Pulse:  70/min. Weight:  234.00 lbs. Height:  74"BMI: 30  Constitutional:  pleasant white male in no acute distress Skin:  scattered hemangiomas, scattered sebaceous cysts Head:  normocephalic, normal hair pattern, no masses or tenderness Neck:  supple, without massess. No JVD, thyromegaly or carotid bruits. Carotid upstroke normal. Chest:  clear to auscultation and percussion, healed median sternotomy scar Cardiac:  Irregular rhythm, normal S1 and S2, No S3 or S4, no murmurs, gallops or rubs detected. Abdomen:  abdomen soft,non-tender, no masses, no hepatospenomegaly, or aneurysm noted Peripheral Pulses:  the femoral,dorsalis pedis, and posterior tibial pulses are full and equal bilaterally with no bruits auscultated. Extremities & Back:  well healed saphenous vein donor site LLE, trace edema Neurological:  no gross motor or sensory deficits noted, affect appropriate, oriented x3. ____________________________ MOST RECENT LIPID PANEL 09/26/10  CHOL TOTL 203 mg/dl, LDL 960 calc, HDL 42 mg/dl, TRIGLYCER 94 mg/dl and CHOL/HDL 4.8 (Calc) ____________________________ IMPRESSIONS/PLAN  1. Atrial fibrillation which is symptomatic 2. Functioning permanent pacemaker 3. Coronary artery disease with previous bypass grafting 4. Stage III chronic kidney disease 5. Hypertension controlled  Recommendations:  INR slightly above goal today. He is to hold warfarin times one day and then resume. Cardioversion discussed with the patient fully including risks and he is agreeable to proceed. Plan in 2 days. EKG shows atrial fibrillation.  ____________________________ TODAYS ORDERS  1. Coag Clinic Visit: Coag OV 2 weeks  2. 12 Lead EKG: Today  3. Comprehensive Metabolic Panel: Today  4. Complete Blood Count: Today  5. Electrical Cardioversion: 2 days                        ____________________________ Cardiology Physician:  Darden Palmer MD [facility]

## 2011-08-08 ENCOUNTER — Ambulatory Visit (HOSPITAL_COMMUNITY): Payer: Medicare Other | Admitting: Critical Care Medicine

## 2011-08-08 ENCOUNTER — Encounter (HOSPITAL_COMMUNITY): Payer: Self-pay | Admitting: Critical Care Medicine

## 2011-08-08 ENCOUNTER — Ambulatory Visit (HOSPITAL_COMMUNITY)
Admission: RE | Admit: 2011-08-08 | Discharge: 2011-08-08 | Disposition: A | Discharge status: Home / Self Care | Payer: Medicare Other | Source: Ambulatory Visit | Attending: Cardiology | Admitting: Cardiology

## 2011-08-08 ENCOUNTER — Encounter (HOSPITAL_COMMUNITY)
Admission: RE | Disposition: A | Discharge status: Home / Self Care | Payer: Self-pay | Source: Ambulatory Visit | Attending: Cardiology

## 2011-08-08 DIAGNOSIS — N401 Enlarged prostate with lower urinary tract symptoms: Secondary | ICD-10-CM | POA: Diagnosis not present

## 2011-08-08 DIAGNOSIS — I10 Essential (primary) hypertension: Secondary | ICD-10-CM | POA: Diagnosis not present

## 2011-08-08 DIAGNOSIS — N183 Chronic kidney disease, stage 3 unspecified: Secondary | ICD-10-CM | POA: Insufficient documentation

## 2011-08-08 DIAGNOSIS — Z7982 Long term (current) use of aspirin: Secondary | ICD-10-CM | POA: Diagnosis not present

## 2011-08-08 DIAGNOSIS — Z951 Presence of aortocoronary bypass graft: Secondary | ICD-10-CM | POA: Insufficient documentation

## 2011-08-08 DIAGNOSIS — I129 Hypertensive chronic kidney disease with stage 1 through stage 4 chronic kidney disease, or unspecified chronic kidney disease: Secondary | ICD-10-CM | POA: Insufficient documentation

## 2011-08-08 DIAGNOSIS — E785 Hyperlipidemia, unspecified: Secondary | ICD-10-CM | POA: Diagnosis not present

## 2011-08-08 DIAGNOSIS — Z79899 Other long term (current) drug therapy: Secondary | ICD-10-CM | POA: Insufficient documentation

## 2011-08-08 DIAGNOSIS — Z950 Presence of cardiac pacemaker: Secondary | ICD-10-CM | POA: Insufficient documentation

## 2011-08-08 DIAGNOSIS — I4891 Unspecified atrial fibrillation: Secondary | ICD-10-CM | POA: Diagnosis not present

## 2011-08-08 DIAGNOSIS — I2510 Atherosclerotic heart disease of native coronary artery without angina pectoris: Secondary | ICD-10-CM | POA: Insufficient documentation

## 2011-08-08 DIAGNOSIS — E119 Type 2 diabetes mellitus without complications: Secondary | ICD-10-CM | POA: Diagnosis not present

## 2011-08-08 HISTORY — PX: CARDIOVERSION: SHX1299

## 2011-08-08 SURGERY — CARDIOVERSION
Anesthesia: General | Wound class: Clean

## 2011-08-08 MED ORDER — SODIUM CHLORIDE 0.9 % IV SOLN: 250.0000 mL | INTRAVENOUS | Status: DC

## 2011-08-08 MED ORDER — SODIUM CHLORIDE 0.9 % IJ SOLN: 3.0000 mL | Freq: Two times a day (BID) | INTRAMUSCULAR | Status: DC

## 2011-08-08 MED ORDER — HYDROCORTISONE 1 % EX CREA: 1.0000 "application " | TOPICAL_CREAM | Freq: Three times a day (TID) | CUTANEOUS | Status: DC | PRN

## 2011-08-08 MED ORDER — SODIUM CHLORIDE 0.9 % IV SOLN
INTRAVENOUS | Status: DC | PRN
  Administered 2011-08-08: 13:00:00 via INTRAVENOUS

## 2011-08-08 MED ORDER — PROPOFOL 10 MG/ML IV EMUL
INTRAVENOUS | Status: DC | PRN
  Administered 2011-08-08: 60 mg via INTRAVENOUS

## 2011-08-08 MED ORDER — SODIUM CHLORIDE 0.9 % IJ SOLN: 3.0000 mL | INTRAMUSCULAR | Status: DC | PRN

## 2011-08-08 NOTE — Preoperative (Signed)
Beta Blockers   Reason not to administer Beta Blockers:Not Applicable, pt took 5/30

## 2011-08-08 NOTE — Discharge Instructions (Signed)
Electrical Cardioversion Cardioversion is the delivery of a jolt of electricity to change the rhythm of the heart. Sticky patches or metal paddles are placed on the chest to deliver the electricity from a special device. This is done to restore a normal rhythm. A rhythm that is too fast or not regular keeps the heart from pumping well. Compared to medicines used to change an abnormal rhythm, cardioversion is faster and works better. It is also unpleasant and may dislodge blood clots from the heart. WHEN WOULD THIS BE DONE?  In an emergency:   There is low or no blood pressure as a result of the heart rhythm.   Normal rhythm must be restored as fast as possible to protect the brain and heart from further damage.   It may save a life.   For less serious heart rhythms, such as atrial fibrillation or flutter, in which:   The heart is beating too fast or is not regular.   The heart is still able to pump enough blood, but not as well as it should.   Medicine to change the rhythm has not worked.   It is safe to wait in order to allow time for preparation.  LET YOUR CAREGIVER KNOW ABOUT:   Every medicine you are taking. It is very important to do this! Know when to take or stop taking any of them.   Any time in the past that you have felt your heart was not beating normally.  RISKS AND COMPLICATIONS   Clots may form in the chambers of the heart if it is beating too fast. These clots may be dislodged during the procedure and travel to other parts of the body.   There is risk of a stroke during and after the procedure if a clot moves. Blood thinners lower this risk.   You may have a special test of your heart (TEE) to make sure there are no clots in your heart.  BEFORE THE PROCEDURE   You may have some tests to see how well your heart is working.   You may start taking blood thinners so your blood does not clot as easily.   Other drugs may be given to help your heart work better.   PROCEDURE (SCHEDULED)  The procedure is typically done in a hospital by a heart doctor (cardiologist).   You will be told when and where to go.   You may be given some medicine through an intravenous (IV) access to reduce discomfort and make you sleepy before the procedure.   Your whole body may move when the shock is delivered. Your chest may feel sore.   You may be able to go home after a few hours. Your heart rhythm will be watched to make sure it does not change.  HOME CARE INSTRUCTIONS   Only take medicine as directed by your caregiver. Be sure you understand how and when to take your medicine.   Learn how to feel your pulse and check it often.   Limit your activity for 48 hours.   Avoid caffeine and other stimulants as directed.  SEEK MEDICAL CARE IF:   You feel like your heart is beating too fast or your pulse is not regular.   You have any questions about your medicines.   You have bleeding that will not stop.  SEEK IMMEDIATE MEDICAL CARE IF:   You are dizzy or feel faint.   It is hard to breathe or you feel short of breath.     There is a change in discomfort in your chest.   Your speech is slurred or you have trouble moving your arm or leg on one side.   You get a muscle cramp.   Your fingers or toes turn cold or blue.  MAKE SURE YOU:   Understand these instructions.   Will watch your condition.   Will get help right away if you are not doing well or get worse.  Document Released: 02/15/2002 Document Revised: 02/14/2011 Document Reviewed: 06/17/2007 [employer] Patient Information 2012 [employer]. 

## 2011-08-08 NOTE — Interval H&P Note (Signed)
History and Physical Interval Note:  08/08/2011 1:15 PM  Christopher [name]  has presented today for cardioversion with the diagnosis of AFIB  The various methods of treatment have been discussed with the patient and family. After consideration of risks, benefits and other options for treatment, the patient has consented to cardioversion.  The patients' history has been reviewed, patient examined, no change in status, stable for procedure.  I have reviewed the patients' chart and labs.  Questions were answered to the patient's satisfaction.     Janice Seales JR,W SPENCER

## 2011-08-08 NOTE — Anesthesia Postprocedure Evaluation (Signed)
Encounter created for cardioversion, please merge 2 encounters

## 2011-08-08 NOTE — CV Procedure (Signed)
Electrical Cardioversion Procedure Note  Christopher [name]   78 y.o. male MRN: [id number] DOB: 17-Oct-1926  Today's date: 08/08/2011  Procedure: Electrical Cardioversion  Indications:  Atrial Fibrillation  Time Out: Verified patient identification, verified procedure,medications/allergies/relevent history reviewed, required imaging and test results available.  Performed  Procedure Details  The patient was NPO after midnight. Anesthesia was administered at the beside  by Dr.Charlene Edwards with 60 mg of propofol.  Cardioversion was done with synchronized biphasic defibrillation with AP pads with 100 watts.  The patient converted to normal sinus rhythm. This was verified by the Medtronic pacer rep and the patient was reprogrammed to a rate of 60 bpm with atrial preference pacing.The patient tolerated the procedure well   IMPRESSION:  Successful cardioversion of atrial fibrillation    W. Viann Fish, Montez Hageman. MD [facility]   08/08/2011, 1:37 PM

## 2011-08-08 NOTE — Anesthesia Preprocedure Evaluation (Addendum)
Anesthesia Evaluation  Patient identified by MRN, date of birth, ID band Patient awake    Reviewed: Allergy & Precautions, H&P , NPO status , Patient's Chart, lab work & pertinent test results, reviewed documented beta blocker date and time   Airway       Dental  (+) Dental Advisory Given   Pulmonary          Cardiovascular hypertension, + CAD + dysrhythmias + pacemaker     Neuro/Psych    GI/Hepatic   Endo/Other  Diabetes mellitus-  Renal/GU Renal InsufficiencyRenal disease     Musculoskeletal   Abdominal   Peds  Hematology   Anesthesia Other Findings   Reproductive/Obstetrics                          Anesthesia Physical Anesthesia Plan  ASA: III  Anesthesia Plan: General   Post-op Pain Management:    Induction: Intravenous  Airway Management Planned: Mask  Additional Equipment:   Intra-op Plan:   Post-operative Plan:   Informed Consent: I have reviewed the patients History and Physical, chart, labs and discussed the procedure including the risks, benefits and alternatives for the proposed anesthesia with the patient or authorized representative who has indicated his/her understanding and acceptance.   Dental advisory given  Plan Discussed with: CRNA, Anesthesiologist and Surgeon  Anesthesia Plan Comments:         Anesthesia Quick Evaluation

## 2011-08-08 NOTE — H&P (View-Only) (Signed)
[name], Christopher C  Date of visit:  08/06/2011 DOB:  09/25/1926    Age:  78 yrs. Medical record number:  [id number]     Account number:  [id number] Primary Care Provider: AVVA, RAVISANKAR R ____________________________ CURRENT DIAGNOSES  1. Atrial fibrillation  2. CAD,Native  3. Long-term (current) Use Of Other Medications  4. Chronic Kidney Disease (Stage 3)  5. Diabetes Mellitus-nidd/circ Dis/controlled  6. Hyperlipidemia  7. Hypertension-essential (benign)  8. Long Term Use Anticoagulant  9. Surgery-Aortocoronary Bypass Grafting  10. Pacemaker/cardiac insitu ____________________________ ALLERGIES  Crestor, Aches  Lipitor, Joint aches  pravastatin, Joint aches ____________________________ MEDICATIONS  1. amlodipine 10 mg Tablet, 1 p.o. daily  2. Fish Oil 1,000 mg Capsule, BID  3. glimepiride 1 mg Tablet, 1 p.o. daily  4. finasteride 5 mg Tablet, 1 p.o. daily  5. folic acid 400 mcg Tablet, 1 p.o. q.d.  6. Aspirin Low Dose 81 mg Tablet, Delayed Release (E.C.), 1 p.o. q.d.  7. horse chestnut 300 mg Capsule, 1 p.o. daily  8. multivitamin Capsule, 1 p.o. q.d.  9. Glucosamine 500 mg Tablet, BID  10. atenolol 25 mg tablet, 1 p.o. daily  11. nitroglycerin 0.4 mg tablet, sublingual, PRN  12. warfarin 5 mg tablet, 1 p.o. daily  13. benazepril 40 mg tablet, 1 p.o. daily  14. triamterene-hydrochlorothiazid 37.5-25 mg tablet, 1/2 tab daily ____________________________ CHIEF COMPLAINTS  Followup of Atrial fibrillation ____________________________ HISTORY OF PRESENT ILLNESS   Patient seen for evaluation prior to cardioversion. He has a history of coronary artery disease with previous bypass grafting and his grafts were patent in 2011. He developed a accelerated junctional rhythm with loss of atrial contractility and had a pacemaker implantation in May of 2012. He has noted recent fatigue and was found to be in atrial fibrillation by pacemaker examination earlier this year. He has continued in  atrial fibrillation and his complaint of modest fatigue. He does have some chronic renal insufficiency and cardioversion is recommended to help improve his symptoms. He has been anticoagulated with warfarin for over one month. He denies angina and has no PND, orthopnea or edema.____________________________ PAST HISTORY  Past Medical Illnesses:  hypertension, DM-non-insulin dependent, hyperlipidemia, lumbar disc disease, BPH;  Cardiovascular Illnesses:  CAD, sinus node dysfunction;  Infectious Diseases:  no previous history of significant infectious diseases;  Surgical Procedures:  CABG w LIMA to LAD, SVG to OM-OM2 05/05/01 [facility];  Trauma History:  no previous history of significant trauma;  NYHA Classification:  II;  Cardiology Procedures-Invasive:  cardiac cath (left) October 2011, Medtronic pacemaker implant May 2012;  Cardiology Procedures-Noninvasive:  treadmill cardiolite October 2010, event monitor September 2011, echocardiogram April 2013;  Cardiac Cath Results:  60% left main, occluded  proximal LAD, widely patent OM 1 OM 2 SVG, widely patent RCA, widely patent LAD LIMA graft;  LVEF of 50% documented via echocardiogram on 07/03/2011  CHADS Score:  3 ____________________________ CARDIO-PULMONARY TEST DATES EKG Date:  08/06/2011;   Cardiac Cath Date:  12/20/2009;  CABG: 05/05/2001;  Holter/Event Monitor Date: 11/24/2009;  Nuclear Study Date:  12/29/2008;  Echocardiography Date: 07/03/2011;  Chest Xray Date: 06/25/2011;   ____________________________ REVIEW OF SYSTEMS General:  malaise and fatigue  Integumentary:  easy bruisability  Eyes:  cataract extraction bilaterally, wears eye glasses/contact lenses  Respiratory:  mild dyspnea with exertion  Cardiovascular:  please review HPI  Abdominal:  denies dyspepsia, GI bleeding, constipation, or diarrhea  Genitourinary-Male:  nocturia Musculoskeletal:  mild chronic low back pain, sciatica ____________________________ PHYSICAL EXAMINATION VITAL  SIGNS    Blood Pressure:  114/70 Sitting, Right arm, regular cuff  , 110/68 Standing, Right arm and regular cuff   Pulse:  70/min. Weight:  234.00 lbs. Height:  74"BMI: 30  Constitutional:  pleasant white male in no acute distress Skin:  scattered hemangiomas, scattered sebaceous cysts Head:  normocephalic, normal hair pattern, no masses or tenderness Neck:  supple, without massess. No JVD, thyromegaly or carotid bruits. Carotid upstroke normal. Chest:  clear to auscultation and percussion, healed median sternotomy scar Cardiac:  Irregular rhythm, normal S1 and S2, No S3 or S4, no murmurs, gallops or rubs detected. Abdomen:  abdomen soft,non-tender, no masses, no hepatospenomegaly, or aneurysm noted Peripheral Pulses:  the femoral,dorsalis pedis, and posterior tibial pulses are full and equal bilaterally with no bruits auscultated. Extremities & Back:  well healed saphenous vein donor site LLE, trace edema Neurological:  no gross motor or sensory deficits noted, affect appropriate, oriented x3. ____________________________ MOST RECENT LIPID PANEL 09/26/10  CHOL TOTL 203 mg/dl, LDL 142 calc, HDL 42 mg/dl, TRIGLYCER 94 mg/dl and CHOL/HDL 4.8 (Calc) ____________________________ IMPRESSIONS/PLAN  1. Atrial fibrillation which is symptomatic 2. Functioning permanent pacemaker 3. Coronary artery disease with previous bypass grafting 4. Stage III chronic kidney disease 5. Hypertension controlled  Recommendations:  INR slightly above goal today. He is to hold warfarin times one day and then resume. Cardioversion discussed with the patient fully including risks and he is agreeable to proceed. Plan in 2 days. EKG shows atrial fibrillation.  ____________________________ TODAYS ORDERS  1. Coag Clinic Visit: Coag OV 2 weeks  2. 12 Lead EKG: Today  3. Comprehensive Metabolic Panel: Today  4. Complete Blood Count: Today  5. Electrical Cardioversion: 2 days                        ____________________________ Cardiology Physician:  W. Spencer Melody Cirrincione, Jr. MD [facility]     

## 2011-08-08 NOTE — Transfer of Care (Signed)
Immediate Anesthesia Transfer of Care Note  Patient: Christopher [name]  Procedure(s) Performed: Procedure(s) (LRB): CARDIOVERSION (N/A)  Patient Location: PACU  Anesthesia Type: General  Level of Consciousness: awake, alert  and oriented  Airway & Oxygen Therapy: Patient Spontanous Breathing and Patient connected to nasal cannula oxygen  Post-op Assessment: Report given to PACU RN, Post -op Vital signs reviewed and stable and Patient moving all extremities X 4  Post vital signs: Reviewed and stable  Complications: No apparent anesthesia complications

## 2011-08-08 NOTE — Anesthesia Preprocedure Evaluation (Addendum)
Anesthesia Evaluation  Patient identified by MRN, date of birth, ID band Patient awake    Reviewed: Allergy & Precautions, H&P , NPO status , Patient's Chart, lab work & pertinent test results  History of Anesthesia Complications Negative for: history of anesthetic complications  Airway Mallampati: II      Dental   Pulmonary  breath sounds clear to auscultation        Cardiovascular hypertension, Pt. on medications + angina + CAD + dysrhythmias + pacemaker     Neuro/Psych    GI/Hepatic negative GI ROS, Neg liver ROS,   Endo/Other  Diabetes mellitus-  Renal/GU negative Renal ROS     Musculoskeletal   Abdominal   Peds  Hematology negative hematology ROS (+)   Anesthesia Other Findings   Reproductive/Obstetrics                          Anesthesia Physical Anesthesia Plan  ASA: III  Anesthesia Plan: MAC   Post-op Pain Management:    Induction: Intravenous  Airway Management Planned:   Additional Equipment:   Intra-op Plan:   Post-operative Plan:   Informed Consent: I have reviewed the patients History and Physical, chart, labs and discussed the procedure including the risks, benefits and alternatives for the proposed anesthesia with the patient or authorized representative who has indicated his/her understanding and acceptance.     Plan Discussed with: CRNA  Anesthesia Plan Comments:        Anesthesia Quick Evaluation

## 2011-08-08 NOTE — Preoperative (Signed)
Beta Blockers   Reason not to administer Beta Blockers:Not Applicable 

## 2011-08-09 ENCOUNTER — Encounter (HOSPITAL_COMMUNITY): Payer: Self-pay | Admitting: Cardiology

## 2011-08-15 DIAGNOSIS — L821 Other seborrheic keratosis: Secondary | ICD-10-CM | POA: Diagnosis not present

## 2011-08-15 DIAGNOSIS — I10 Essential (primary) hypertension: Secondary | ICD-10-CM | POA: Diagnosis not present

## 2011-08-15 DIAGNOSIS — Z7901 Long term (current) use of anticoagulants: Secondary | ICD-10-CM | POA: Diagnosis not present

## 2011-08-15 DIAGNOSIS — I4891 Unspecified atrial fibrillation: Secondary | ICD-10-CM | POA: Diagnosis not present

## 2011-08-15 DIAGNOSIS — L570 Actinic keratosis: Secondary | ICD-10-CM | POA: Diagnosis not present

## 2011-08-15 DIAGNOSIS — Z951 Presence of aortocoronary bypass graft: Secondary | ICD-10-CM | POA: Diagnosis not present

## 2011-08-15 DIAGNOSIS — Z79899 Other long term (current) drug therapy: Secondary | ICD-10-CM | POA: Diagnosis not present

## 2011-08-15 DIAGNOSIS — I2510 Atherosclerotic heart disease of native coronary artery without angina pectoris: Secondary | ICD-10-CM | POA: Diagnosis not present

## 2011-08-15 DIAGNOSIS — Z85828 Personal history of other malignant neoplasm of skin: Secondary | ICD-10-CM | POA: Diagnosis not present

## 2011-08-15 DIAGNOSIS — E785 Hyperlipidemia, unspecified: Secondary | ICD-10-CM | POA: Diagnosis not present

## 2011-08-21 DIAGNOSIS — E785 Hyperlipidemia, unspecified: Secondary | ICD-10-CM | POA: Diagnosis not present

## 2011-08-21 DIAGNOSIS — I10 Essential (primary) hypertension: Secondary | ICD-10-CM | POA: Diagnosis not present

## 2011-08-21 DIAGNOSIS — I2510 Atherosclerotic heart disease of native coronary artery without angina pectoris: Secondary | ICD-10-CM | POA: Diagnosis not present

## 2011-08-21 DIAGNOSIS — I4891 Unspecified atrial fibrillation: Secondary | ICD-10-CM | POA: Diagnosis not present

## 2011-08-21 DIAGNOSIS — Z951 Presence of aortocoronary bypass graft: Secondary | ICD-10-CM | POA: Diagnosis not present

## 2011-08-21 DIAGNOSIS — Z7901 Long term (current) use of anticoagulants: Secondary | ICD-10-CM | POA: Diagnosis not present

## 2011-08-21 DIAGNOSIS — Z950 Presence of cardiac pacemaker: Secondary | ICD-10-CM | POA: Diagnosis not present

## 2011-09-05 DIAGNOSIS — N402 Nodular prostate without lower urinary tract symptoms: Secondary | ICD-10-CM | POA: Diagnosis not present

## 2011-09-05 DIAGNOSIS — R972 Elevated prostate specific antigen [PSA]: Secondary | ICD-10-CM | POA: Diagnosis not present

## 2011-09-18 DIAGNOSIS — E785 Hyperlipidemia, unspecified: Secondary | ICD-10-CM | POA: Diagnosis not present

## 2011-09-18 DIAGNOSIS — Z7901 Long term (current) use of anticoagulants: Secondary | ICD-10-CM | POA: Diagnosis not present

## 2011-09-18 DIAGNOSIS — Z950 Presence of cardiac pacemaker: Secondary | ICD-10-CM | POA: Diagnosis not present

## 2011-09-18 DIAGNOSIS — I2510 Atherosclerotic heart disease of native coronary artery without angina pectoris: Secondary | ICD-10-CM | POA: Diagnosis not present

## 2011-09-18 DIAGNOSIS — I4891 Unspecified atrial fibrillation: Secondary | ICD-10-CM | POA: Diagnosis not present

## 2011-09-18 DIAGNOSIS — I10 Essential (primary) hypertension: Secondary | ICD-10-CM | POA: Diagnosis not present

## 2011-09-18 DIAGNOSIS — Z951 Presence of aortocoronary bypass graft: Secondary | ICD-10-CM | POA: Diagnosis not present

## 2011-09-24 DIAGNOSIS — E785 Hyperlipidemia, unspecified: Secondary | ICD-10-CM | POA: Diagnosis not present

## 2011-09-24 DIAGNOSIS — Z951 Presence of aortocoronary bypass graft: Secondary | ICD-10-CM | POA: Diagnosis not present

## 2011-09-24 DIAGNOSIS — Z950 Presence of cardiac pacemaker: Secondary | ICD-10-CM | POA: Diagnosis not present

## 2011-09-24 DIAGNOSIS — Z7901 Long term (current) use of anticoagulants: Secondary | ICD-10-CM | POA: Diagnosis not present

## 2011-09-24 DIAGNOSIS — I2510 Atherosclerotic heart disease of native coronary artery without angina pectoris: Secondary | ICD-10-CM | POA: Diagnosis not present

## 2011-09-24 DIAGNOSIS — I10 Essential (primary) hypertension: Secondary | ICD-10-CM | POA: Diagnosis not present

## 2011-09-24 DIAGNOSIS — I4891 Unspecified atrial fibrillation: Secondary | ICD-10-CM | POA: Diagnosis not present

## 2011-10-16 DIAGNOSIS — E785 Hyperlipidemia, unspecified: Secondary | ICD-10-CM | POA: Diagnosis not present

## 2011-10-16 DIAGNOSIS — I2510 Atherosclerotic heart disease of native coronary artery without angina pectoris: Secondary | ICD-10-CM | POA: Diagnosis not present

## 2011-10-16 DIAGNOSIS — I10 Essential (primary) hypertension: Secondary | ICD-10-CM | POA: Diagnosis not present

## 2011-10-16 DIAGNOSIS — Z951 Presence of aortocoronary bypass graft: Secondary | ICD-10-CM | POA: Diagnosis not present

## 2011-10-16 DIAGNOSIS — I4891 Unspecified atrial fibrillation: Secondary | ICD-10-CM | POA: Diagnosis not present

## 2011-10-16 DIAGNOSIS — Z950 Presence of cardiac pacemaker: Secondary | ICD-10-CM | POA: Diagnosis not present

## 2011-10-16 DIAGNOSIS — Z7901 Long term (current) use of anticoagulants: Secondary | ICD-10-CM | POA: Diagnosis not present

## 2011-11-06 DIAGNOSIS — E119 Type 2 diabetes mellitus without complications: Secondary | ICD-10-CM | POA: Diagnosis not present

## 2011-11-06 DIAGNOSIS — E785 Hyperlipidemia, unspecified: Secondary | ICD-10-CM | POA: Diagnosis not present

## 2011-11-06 DIAGNOSIS — I10 Essential (primary) hypertension: Secondary | ICD-10-CM | POA: Diagnosis not present

## 2011-11-06 DIAGNOSIS — I2510 Atherosclerotic heart disease of native coronary artery without angina pectoris: Secondary | ICD-10-CM | POA: Diagnosis not present

## 2011-11-06 DIAGNOSIS — Z125 Encounter for screening for malignant neoplasm of prostate: Secondary | ICD-10-CM | POA: Diagnosis not present

## 2011-11-13 DIAGNOSIS — E785 Hyperlipidemia, unspecified: Secondary | ICD-10-CM | POA: Diagnosis not present

## 2011-11-13 DIAGNOSIS — I25810 Atherosclerosis of coronary artery bypass graft(s) without angina pectoris: Secondary | ICD-10-CM | POA: Diagnosis not present

## 2011-11-13 DIAGNOSIS — I4891 Unspecified atrial fibrillation: Secondary | ICD-10-CM | POA: Diagnosis not present

## 2011-11-13 DIAGNOSIS — Z7901 Long term (current) use of anticoagulants: Secondary | ICD-10-CM | POA: Diagnosis not present

## 2011-11-13 DIAGNOSIS — I2510 Atherosclerotic heart disease of native coronary artery without angina pectoris: Secondary | ICD-10-CM | POA: Diagnosis not present

## 2011-11-13 DIAGNOSIS — M545 Low back pain: Secondary | ICD-10-CM | POA: Diagnosis not present

## 2011-11-13 DIAGNOSIS — Z23 Encounter for immunization: Secondary | ICD-10-CM | POA: Diagnosis not present

## 2011-11-13 DIAGNOSIS — E1159 Type 2 diabetes mellitus with other circulatory complications: Secondary | ICD-10-CM | POA: Diagnosis not present

## 2011-11-13 DIAGNOSIS — Z951 Presence of aortocoronary bypass graft: Secondary | ICD-10-CM | POA: Diagnosis not present

## 2011-11-13 DIAGNOSIS — Z950 Presence of cardiac pacemaker: Secondary | ICD-10-CM | POA: Diagnosis not present

## 2011-11-13 DIAGNOSIS — I10 Essential (primary) hypertension: Secondary | ICD-10-CM | POA: Diagnosis not present

## 2011-11-18 DIAGNOSIS — Z1212 Encounter for screening for malignant neoplasm of rectum: Secondary | ICD-10-CM | POA: Diagnosis not present

## 2011-11-27 DIAGNOSIS — Z7901 Long term (current) use of anticoagulants: Secondary | ICD-10-CM | POA: Diagnosis not present

## 2011-12-16 DIAGNOSIS — I10 Essential (primary) hypertension: Secondary | ICD-10-CM | POA: Diagnosis not present

## 2011-12-16 DIAGNOSIS — I4891 Unspecified atrial fibrillation: Secondary | ICD-10-CM | POA: Diagnosis not present

## 2011-12-16 DIAGNOSIS — Z951 Presence of aortocoronary bypass graft: Secondary | ICD-10-CM | POA: Diagnosis not present

## 2011-12-16 DIAGNOSIS — I2510 Atherosclerotic heart disease of native coronary artery without angina pectoris: Secondary | ICD-10-CM | POA: Diagnosis not present

## 2011-12-16 DIAGNOSIS — Z950 Presence of cardiac pacemaker: Secondary | ICD-10-CM | POA: Diagnosis not present

## 2011-12-16 DIAGNOSIS — E785 Hyperlipidemia, unspecified: Secondary | ICD-10-CM | POA: Diagnosis not present

## 2011-12-16 DIAGNOSIS — Z7901 Long term (current) use of anticoagulants: Secondary | ICD-10-CM | POA: Diagnosis not present

## 2011-12-18 DIAGNOSIS — I2510 Atherosclerotic heart disease of native coronary artery without angina pectoris: Secondary | ICD-10-CM | POA: Diagnosis not present

## 2011-12-18 DIAGNOSIS — Z7901 Long term (current) use of anticoagulants: Secondary | ICD-10-CM | POA: Diagnosis not present

## 2011-12-18 DIAGNOSIS — Z950 Presence of cardiac pacemaker: Secondary | ICD-10-CM | POA: Diagnosis not present

## 2011-12-18 DIAGNOSIS — E785 Hyperlipidemia, unspecified: Secondary | ICD-10-CM | POA: Diagnosis not present

## 2011-12-18 DIAGNOSIS — I4891 Unspecified atrial fibrillation: Secondary | ICD-10-CM | POA: Diagnosis not present

## 2011-12-18 DIAGNOSIS — Z951 Presence of aortocoronary bypass graft: Secondary | ICD-10-CM | POA: Diagnosis not present

## 2011-12-18 DIAGNOSIS — I10 Essential (primary) hypertension: Secondary | ICD-10-CM | POA: Diagnosis not present

## 2011-12-25 DIAGNOSIS — Z950 Presence of cardiac pacemaker: Secondary | ICD-10-CM | POA: Diagnosis not present

## 2011-12-25 DIAGNOSIS — Z7901 Long term (current) use of anticoagulants: Secondary | ICD-10-CM | POA: Diagnosis not present

## 2011-12-25 DIAGNOSIS — E785 Hyperlipidemia, unspecified: Secondary | ICD-10-CM | POA: Diagnosis not present

## 2011-12-25 DIAGNOSIS — I10 Essential (primary) hypertension: Secondary | ICD-10-CM | POA: Diagnosis not present

## 2011-12-25 DIAGNOSIS — I4891 Unspecified atrial fibrillation: Secondary | ICD-10-CM | POA: Diagnosis not present

## 2011-12-25 DIAGNOSIS — Z951 Presence of aortocoronary bypass graft: Secondary | ICD-10-CM | POA: Diagnosis not present

## 2011-12-25 DIAGNOSIS — I2510 Atherosclerotic heart disease of native coronary artery without angina pectoris: Secondary | ICD-10-CM | POA: Diagnosis not present

## 2011-12-26 DIAGNOSIS — I4891 Unspecified atrial fibrillation: Secondary | ICD-10-CM | POA: Diagnosis not present

## 2011-12-26 DIAGNOSIS — Z951 Presence of aortocoronary bypass graft: Secondary | ICD-10-CM | POA: Diagnosis not present

## 2011-12-26 DIAGNOSIS — Z950 Presence of cardiac pacemaker: Secondary | ICD-10-CM | POA: Diagnosis not present

## 2011-12-26 DIAGNOSIS — I2510 Atherosclerotic heart disease of native coronary artery without angina pectoris: Secondary | ICD-10-CM | POA: Diagnosis not present

## 2011-12-26 DIAGNOSIS — I10 Essential (primary) hypertension: Secondary | ICD-10-CM | POA: Diagnosis not present

## 2011-12-26 DIAGNOSIS — E785 Hyperlipidemia, unspecified: Secondary | ICD-10-CM | POA: Diagnosis not present

## 2011-12-26 DIAGNOSIS — Z7901 Long term (current) use of anticoagulants: Secondary | ICD-10-CM | POA: Diagnosis not present

## 2012-01-08 DIAGNOSIS — Z950 Presence of cardiac pacemaker: Secondary | ICD-10-CM | POA: Diagnosis not present

## 2012-01-08 DIAGNOSIS — E785 Hyperlipidemia, unspecified: Secondary | ICD-10-CM | POA: Diagnosis not present

## 2012-01-08 DIAGNOSIS — Z951 Presence of aortocoronary bypass graft: Secondary | ICD-10-CM | POA: Diagnosis not present

## 2012-01-08 DIAGNOSIS — I4891 Unspecified atrial fibrillation: Secondary | ICD-10-CM | POA: Diagnosis not present

## 2012-01-08 DIAGNOSIS — I10 Essential (primary) hypertension: Secondary | ICD-10-CM | POA: Diagnosis not present

## 2012-01-08 DIAGNOSIS — I2510 Atherosclerotic heart disease of native coronary artery without angina pectoris: Secondary | ICD-10-CM | POA: Diagnosis not present

## 2012-01-08 DIAGNOSIS — Z7901 Long term (current) use of anticoagulants: Secondary | ICD-10-CM | POA: Diagnosis not present

## 2012-01-29 DIAGNOSIS — I2510 Atherosclerotic heart disease of native coronary artery without angina pectoris: Secondary | ICD-10-CM | POA: Diagnosis not present

## 2012-01-29 DIAGNOSIS — Z951 Presence of aortocoronary bypass graft: Secondary | ICD-10-CM | POA: Diagnosis not present

## 2012-01-29 DIAGNOSIS — E785 Hyperlipidemia, unspecified: Secondary | ICD-10-CM | POA: Diagnosis not present

## 2012-01-29 DIAGNOSIS — I10 Essential (primary) hypertension: Secondary | ICD-10-CM | POA: Diagnosis not present

## 2012-01-29 DIAGNOSIS — Z950 Presence of cardiac pacemaker: Secondary | ICD-10-CM | POA: Diagnosis not present

## 2012-01-29 DIAGNOSIS — I4891 Unspecified atrial fibrillation: Secondary | ICD-10-CM | POA: Diagnosis not present

## 2012-01-29 DIAGNOSIS — Z7901 Long term (current) use of anticoagulants: Secondary | ICD-10-CM | POA: Diagnosis not present

## 2012-02-20 DIAGNOSIS — R071 Chest pain on breathing: Secondary | ICD-10-CM | POA: Diagnosis not present

## 2012-02-21 DIAGNOSIS — E785 Hyperlipidemia, unspecified: Secondary | ICD-10-CM | POA: Diagnosis not present

## 2012-02-21 DIAGNOSIS — I4891 Unspecified atrial fibrillation: Secondary | ICD-10-CM | POA: Diagnosis not present

## 2012-02-21 DIAGNOSIS — I2510 Atherosclerotic heart disease of native coronary artery without angina pectoris: Secondary | ICD-10-CM | POA: Diagnosis not present

## 2012-02-21 DIAGNOSIS — Z950 Presence of cardiac pacemaker: Secondary | ICD-10-CM | POA: Diagnosis not present

## 2012-02-21 DIAGNOSIS — Z7901 Long term (current) use of anticoagulants: Secondary | ICD-10-CM | POA: Diagnosis not present

## 2012-02-21 DIAGNOSIS — Z951 Presence of aortocoronary bypass graft: Secondary | ICD-10-CM | POA: Diagnosis not present

## 2012-02-21 DIAGNOSIS — I10 Essential (primary) hypertension: Secondary | ICD-10-CM | POA: Diagnosis not present

## 2012-02-21 DIAGNOSIS — Z79899 Other long term (current) drug therapy: Secondary | ICD-10-CM | POA: Diagnosis not present

## 2012-02-26 ENCOUNTER — Other Ambulatory Visit: Payer: Self-pay | Admitting: Dermatology

## 2012-02-26 DIAGNOSIS — D692 Other nonthrombocytopenic purpura: Secondary | ICD-10-CM | POA: Diagnosis not present

## 2012-02-26 DIAGNOSIS — D2370 Other benign neoplasm of skin of unspecified lower limb, including hip: Secondary | ICD-10-CM | POA: Diagnosis not present

## 2012-02-26 DIAGNOSIS — D485 Neoplasm of uncertain behavior of skin: Secondary | ICD-10-CM | POA: Diagnosis not present

## 2012-02-26 DIAGNOSIS — L821 Other seborrheic keratosis: Secondary | ICD-10-CM | POA: Diagnosis not present

## 2012-02-26 DIAGNOSIS — C44519 Basal cell carcinoma of skin of other part of trunk: Secondary | ICD-10-CM | POA: Diagnosis not present

## 2012-02-26 DIAGNOSIS — L259 Unspecified contact dermatitis, unspecified cause: Secondary | ICD-10-CM | POA: Diagnosis not present

## 2012-02-26 DIAGNOSIS — L719 Rosacea, unspecified: Secondary | ICD-10-CM | POA: Diagnosis not present

## 2012-02-26 DIAGNOSIS — Z85828 Personal history of other malignant neoplasm of skin: Secondary | ICD-10-CM | POA: Diagnosis not present

## 2012-02-26 DIAGNOSIS — L570 Actinic keratosis: Secondary | ICD-10-CM | POA: Diagnosis not present

## 2012-02-28 DIAGNOSIS — R972 Elevated prostate specific antigen [PSA]: Secondary | ICD-10-CM | POA: Diagnosis not present

## 2012-02-28 DIAGNOSIS — N402 Nodular prostate without lower urinary tract symptoms: Secondary | ICD-10-CM | POA: Diagnosis not present

## 2012-03-03 DIAGNOSIS — R071 Chest pain on breathing: Secondary | ICD-10-CM | POA: Diagnosis not present

## 2012-03-03 DIAGNOSIS — Z1331 Encounter for screening for depression: Secondary | ICD-10-CM | POA: Diagnosis not present

## 2012-03-03 DIAGNOSIS — IMO0002 Reserved for concepts with insufficient information to code with codable children: Secondary | ICD-10-CM | POA: Diagnosis not present

## 2012-03-06 DIAGNOSIS — N402 Nodular prostate without lower urinary tract symptoms: Secondary | ICD-10-CM | POA: Diagnosis not present

## 2012-03-19 DIAGNOSIS — I4891 Unspecified atrial fibrillation: Secondary | ICD-10-CM | POA: Diagnosis not present

## 2012-03-19 DIAGNOSIS — Z7901 Long term (current) use of anticoagulants: Secondary | ICD-10-CM | POA: Diagnosis not present

## 2012-03-19 DIAGNOSIS — I10 Essential (primary) hypertension: Secondary | ICD-10-CM | POA: Diagnosis not present

## 2012-03-19 DIAGNOSIS — I2510 Atherosclerotic heart disease of native coronary artery without angina pectoris: Secondary | ICD-10-CM | POA: Diagnosis not present

## 2012-03-19 DIAGNOSIS — Z951 Presence of aortocoronary bypass graft: Secondary | ICD-10-CM | POA: Diagnosis not present

## 2012-03-19 DIAGNOSIS — E785 Hyperlipidemia, unspecified: Secondary | ICD-10-CM | POA: Diagnosis not present

## 2012-03-19 DIAGNOSIS — Z950 Presence of cardiac pacemaker: Secondary | ICD-10-CM | POA: Diagnosis not present

## 2012-03-26 DIAGNOSIS — I10 Essential (primary) hypertension: Secondary | ICD-10-CM | POA: Diagnosis not present

## 2012-03-26 DIAGNOSIS — Z950 Presence of cardiac pacemaker: Secondary | ICD-10-CM | POA: Diagnosis not present

## 2012-05-12 DIAGNOSIS — Z23 Encounter for immunization: Secondary | ICD-10-CM | POA: Diagnosis not present

## 2012-05-12 DIAGNOSIS — Z7901 Long term (current) use of anticoagulants: Secondary | ICD-10-CM | POA: Diagnosis not present

## 2012-05-12 DIAGNOSIS — E1159 Type 2 diabetes mellitus with other circulatory complications: Secondary | ICD-10-CM | POA: Diagnosis not present

## 2012-05-12 DIAGNOSIS — M545 Low back pain: Secondary | ICD-10-CM | POA: Diagnosis not present

## 2012-05-12 DIAGNOSIS — I25810 Atherosclerosis of coronary artery bypass graft(s) without angina pectoris: Secondary | ICD-10-CM | POA: Diagnosis not present

## 2012-06-11 DIAGNOSIS — Z951 Presence of aortocoronary bypass graft: Secondary | ICD-10-CM | POA: Diagnosis not present

## 2012-06-11 DIAGNOSIS — Z7901 Long term (current) use of anticoagulants: Secondary | ICD-10-CM | POA: Diagnosis not present

## 2012-06-11 DIAGNOSIS — Z950 Presence of cardiac pacemaker: Secondary | ICD-10-CM | POA: Diagnosis not present

## 2012-06-11 DIAGNOSIS — I2510 Atherosclerotic heart disease of native coronary artery without angina pectoris: Secondary | ICD-10-CM | POA: Diagnosis not present

## 2012-06-11 DIAGNOSIS — I10 Essential (primary) hypertension: Secondary | ICD-10-CM | POA: Diagnosis not present

## 2012-06-11 DIAGNOSIS — I4891 Unspecified atrial fibrillation: Secondary | ICD-10-CM | POA: Diagnosis not present

## 2012-06-11 DIAGNOSIS — E785 Hyperlipidemia, unspecified: Secondary | ICD-10-CM | POA: Diagnosis not present

## 2012-06-25 DIAGNOSIS — I2510 Atherosclerotic heart disease of native coronary artery without angina pectoris: Secondary | ICD-10-CM | POA: Diagnosis not present

## 2012-06-25 DIAGNOSIS — Z7901 Long term (current) use of anticoagulants: Secondary | ICD-10-CM | POA: Diagnosis not present

## 2012-06-25 DIAGNOSIS — Z950 Presence of cardiac pacemaker: Secondary | ICD-10-CM | POA: Diagnosis not present

## 2012-06-25 DIAGNOSIS — Z951 Presence of aortocoronary bypass graft: Secondary | ICD-10-CM | POA: Diagnosis not present

## 2012-06-25 DIAGNOSIS — I4891 Unspecified atrial fibrillation: Secondary | ICD-10-CM | POA: Diagnosis not present

## 2012-06-25 DIAGNOSIS — Z79899 Other long term (current) drug therapy: Secondary | ICD-10-CM | POA: Diagnosis not present

## 2012-06-25 DIAGNOSIS — I10 Essential (primary) hypertension: Secondary | ICD-10-CM | POA: Diagnosis not present

## 2012-06-25 DIAGNOSIS — E785 Hyperlipidemia, unspecified: Secondary | ICD-10-CM | POA: Diagnosis not present

## 2012-07-02 ENCOUNTER — Ambulatory Visit
Admission: RE | Admit: 2012-07-02 | Discharge: 2012-07-02 | Disposition: A | Discharge status: Home / Self Care | Payer: Medicare Other | Source: Ambulatory Visit | Attending: Cardiology | Admitting: Cardiology

## 2012-07-02 ENCOUNTER — Other Ambulatory Visit: Payer: Self-pay | Admitting: Cardiology

## 2012-07-02 DIAGNOSIS — I2510 Atherosclerotic heart disease of native coronary artery without angina pectoris: Secondary | ICD-10-CM | POA: Diagnosis not present

## 2012-07-02 DIAGNOSIS — E785 Hyperlipidemia, unspecified: Secondary | ICD-10-CM | POA: Diagnosis not present

## 2012-07-02 DIAGNOSIS — I10 Essential (primary) hypertension: Secondary | ICD-10-CM | POA: Diagnosis not present

## 2012-07-02 DIAGNOSIS — I4891 Unspecified atrial fibrillation: Secondary | ICD-10-CM

## 2012-07-02 DIAGNOSIS — Z7901 Long term (current) use of anticoagulants: Secondary | ICD-10-CM | POA: Diagnosis not present

## 2012-07-02 DIAGNOSIS — Z951 Presence of aortocoronary bypass graft: Secondary | ICD-10-CM | POA: Diagnosis not present

## 2012-07-02 DIAGNOSIS — J449 Chronic obstructive pulmonary disease, unspecified: Secondary | ICD-10-CM | POA: Diagnosis not present

## 2012-07-02 DIAGNOSIS — R0602 Shortness of breath: Secondary | ICD-10-CM | POA: Diagnosis not present

## 2012-07-03 DIAGNOSIS — H11009 Unspecified pterygium of unspecified eye: Secondary | ICD-10-CM | POA: Diagnosis not present

## 2012-07-03 DIAGNOSIS — H532 Diplopia: Secondary | ICD-10-CM | POA: Diagnosis not present

## 2012-07-03 DIAGNOSIS — E119 Type 2 diabetes mellitus without complications: Secondary | ICD-10-CM | POA: Diagnosis not present

## 2012-07-03 DIAGNOSIS — H43819 Vitreous degeneration, unspecified eye: Secondary | ICD-10-CM | POA: Diagnosis not present

## 2012-07-15 ENCOUNTER — Other Ambulatory Visit: Payer: Self-pay | Admitting: Cardiology

## 2012-07-15 ENCOUNTER — Ambulatory Visit
Admission: RE | Admit: 2012-07-15 | Discharge: 2012-07-15 | Disposition: A | Discharge status: Home / Self Care | Payer: Medicare Other | Source: Ambulatory Visit | Attending: Cardiology | Admitting: Cardiology

## 2012-07-15 DIAGNOSIS — R0602 Shortness of breath: Secondary | ICD-10-CM

## 2012-07-15 DIAGNOSIS — I4891 Unspecified atrial fibrillation: Secondary | ICD-10-CM | POA: Diagnosis not present

## 2012-07-15 DIAGNOSIS — I10 Essential (primary) hypertension: Secondary | ICD-10-CM | POA: Diagnosis not present

## 2012-07-15 DIAGNOSIS — R0989 Other specified symptoms and signs involving the circulatory and respiratory systems: Secondary | ICD-10-CM | POA: Diagnosis not present

## 2012-07-15 DIAGNOSIS — Z951 Presence of aortocoronary bypass graft: Secondary | ICD-10-CM | POA: Diagnosis not present

## 2012-07-15 DIAGNOSIS — I2510 Atherosclerotic heart disease of native coronary artery without angina pectoris: Secondary | ICD-10-CM | POA: Diagnosis not present

## 2012-07-15 DIAGNOSIS — Z7901 Long term (current) use of anticoagulants: Secondary | ICD-10-CM | POA: Diagnosis not present

## 2012-07-15 DIAGNOSIS — E785 Hyperlipidemia, unspecified: Secondary | ICD-10-CM | POA: Diagnosis not present

## 2012-07-15 DIAGNOSIS — R0609 Other forms of dyspnea: Secondary | ICD-10-CM | POA: Diagnosis not present

## 2012-07-15 DIAGNOSIS — J90 Pleural effusion, not elsewhere classified: Secondary | ICD-10-CM | POA: Diagnosis not present

## 2012-07-15 NOTE — H&P (Signed)
Schueller, Coye C  Date of visit:  07/15/2012 DOB:  10/06/1926    Age:  79 yrs. Medical record number:  [id number]     Account number:  [id number] Primary Care Provider: AVVA, RAVISANKAR R ____________________________ CURRENT DIAGNOSES  1. Dyspnea  2. Atrial fibrillation  3. CAD,Native  4. Chronic Kidney Disease (Stage 3)  5. Diabetes Mellitus-nidd/circ Dis/controlled  6. Hyperlipidemia  7. Hypertension-essential (benign)  8. Long-term (current) Use Of Other Medications  9. Long Term Use Anticoagulant  10. Surgery-Aortocoronary Bypass Grafting  11. Dyspnea  12. Pacemaker/cardiac insitu ____________________________ ALLERGIES  Crestor, Aches  Lipitor, Joint aches  pravastatin, Joint aches ____________________________ MEDICATIONS  1. amlodipine 10 mg Tablet, 1 p.o. daily  2. Fish Oil 1,000 mg Capsule, BID  3. glimepiride 1 mg Tablet, 1 p.o. daily  4. finasteride 5 mg Tablet, 1 p.o. daily  5. folic acid 400 mcg Tablet, 1 p.o. q.d.  6. Aspirin Low Dose 81 mg Tablet, Delayed Release (E.C.), 1 p.o. q.d.  7. horse chestnut 300 mg Capsule, 1 p.o. daily  8. multivitamin Capsule, 1 p.o. q.d.  9. Glucosamine 500 mg Tablet, BID  10. atenolol 25 mg tablet, 1 p.o. daily  11. nitroglycerin 0.4 mg tablet, sublingual, PRN  12. warfarin 5 mg tablet, 1 p.o. daily  13. triamterene-hydrochlorothiazid 37.5-25 mg tablet, 1/2 tab daily  14. benazepril 40 mg tablet, 1 p.o. daily  15. amiodarone 200 mg tablet, BID ____________________________ CHIEF COMPLAINTS  Dizzy with standing  Dyspnea  Wheezing ____________________________ HISTORY OF PRESENT ILLNESS  Patient seen for preoperative visit prior to cardioversion. He developed worsening dyspnea and was found to be in atrial fibrillation. He was started on amiodarone on April 24 and was due to be seen in 3 weeks but has had progressive shortness of breath, wheezing at night as well as some PND. He has had significant dizziness with standing and a feeling of  general unsteadiness. He has also noticed a cough at night. He has become quite fatigued and has very limited exercise ability at this time and so his cardioversion is being moved up. He had a previous cardioversion about one year ago. At that time he improved after he was placed back in sinus rhythm. He also had a junctional rhythm with exercise that caused dyspnea and that resolved with permanent pacemaking. He denies angina and his grafts were patent around a couple of years ago.  ____________________________ PAST HISTORY  Past Medical Illnesses:  hypertension, DM-non-insulin dependent, hyperlipidemia, lumbar disc disease, BPH, chronic kidney disease Stage 3, shingles;  Cardiovascular Illnesses:  CAD, sinus node dysfunction, atrial fibrillation;  Infectious Diseases:  no previous history of significant infectious diseases;  Surgical Procedures:  CABG w LIMA to LAD, SVG to OM-OM2 05/05/01 [facility];  Trauma History:  no previous history of significant trauma;  NYHA Classification:  II;  Cardiology Procedures-Invasive:  cardiac cath (left) October 2011, Medtronic pacemaker implant May 2012, cardioversion May 2013;  Cardiology Procedures-Noninvasive:  treadmill cardiolite October 2010, event monitor September 2011, echocardiogram April 2013, echocardiogram May 2014;  Cardiac Cath Results:  60% left main, occluded  proximal LAD, widely patent OM 1 OM 2 SVG, widely patent RCA, widely patent LAD LIMA graft;  LVEF of 55% documented via echocardiogram on 07/03/2011,  CHADS Score:  3,  ____________________________ CARDIO-PULMONARY TEST DATES EKG Date:  07/15/2012;   Cardiac Cath Date:  12/20/2009;  CABG: 05/05/2001;  Holter/Event Monitor Date: 11/24/2009;  Nuclear Study Date:  12/29/2008;  Echocardiography Date: 07/15/2012;  Chest Xray Date: 07/02/2012;     ____________________________ FAMILY HISTORY Father - died of cancer; Mother - died of cancer; Brother 1 - CABG; Brother 2 -  alive and well;   ____________________________ SOCIAL HISTORY Alcohol Use:  does not use alcohol;  Smoking:  used to smoke but quit 1970;  Diet:  regular diet;  Lifestyle:  widowed and from first wife and remarried second wife;  Exercise:  exercises regularly;  Occupation:  retired and auto mechanic and body shop person;  Residence:  lives with wife;   ____________________________ REVIEW OF SYSTEMS General:  malaise and fatigue  Integumentary:no rashes or new skin lesions. Eyes: cataract extraction bilaterally, wears eye glasses/contact lenses Respiratory: dyspnea with exertion, wheezing Cardiovascular:  please review HPI Abdominal: denies dyspepsia, GI bleeding, constipation, or diarrhea Genitourinary-Male: nocturia  Musculoskeletal:  mild chronic low back pain Neurological:  denies headaches, stroke, or TIA,. Psychiatric:  denies depession or anxiety.  ____________________________ PHYSICAL EXAMINATION VITAL SIGNS  Blood Pressure:  110/66 Sitting, Left arm, regular cuff  , 110/70 Standing, Left arm and regular cuff   Pulse:  76/min. Weight:  238.00 lbs. Height:  74"BMI: 30  Constitutional:  pleasant white male in no acute distress Skin:  scattered hemangiomas, scattered sebaceous cysts Head:  normocephalic, normal hair pattern, no masses or tenderness Eyes:  EOMS Intact, PERRLA, C and S clear, Funduscopic exam not done. ENT:  ears, nose and throat reveal no gross abnormalities.  Dentition good. Neck:  supple, without massess. No JVD, thyromegaly or carotid bruits. Carotid upstroke normal. Chest:  clear to auscultation and percussion, healed median sternotomy scar Cardiac:  Irregular rhythm, normal S1 and S2, No S3 or S4, no murmurs, gallops or rubs detected. Abdomen:  abdomen soft,non-tender, no masses, no hepatospenomegaly, or aneurysm noted Peripheral Pulses:  the femoral,dorsalis pedis, and posterior tibial pulses are full and equal bilaterally with no bruits auscultated. Extremities & Back:  well healed  saphenous vein donor site LLE, trace edema Neurological:  no gross motor or sensory deficits noted, affect appropriate, oriented x3. ____________________________ MOST RECENT LIPID PANEL 11/06/11  CHOL TOTL 231 mg/dl, LDL 159 calc, HDL 43 mg/dl, TRIGLYCER 143 mg/dl and CHOL/HDL 5.4 (Calc) ____________________________ IMPRESSIONS/PLAN  1. Persistent atrial fibrillation 2. Coronary artery disease with previous bypass grafting 3. Functioning permanent pacemaker 4. Stage III chronic kidney disease 5. Hypertensive heart disease 6. Long-term anticoagulation with warfarin 7. Dyspnea due to fluid retention and atrial fibrillation/diastolic CHF  Recommendations:  Cardioversion discussed with the patient including risks of stroke, arrhythmia, death, or anesthesia risks. The patient understands and is willing to proceed. He does have a fairly significant left atrial size. We also discussed possibility of reversion to atrial fibrillation. He was given a dose of Lasix today.  His EF is 55% with good systolic function. ____________________________ TODAYS ORDERS  1. Comprehensive Metabolic Panel: Today  2. Complete Blood Count: Today  3. BNP: Today  4. CHEST XRAY: Today  5. Electrical Cardioversion: 1 day  6. 12 Lead EKG: Today  7. Coag Clinic Visit: Coag OV 2 weeks                       ____________________________ Cardiology Physician:  W. Spencer Jonessa Triplett, Jr. MD [facility]     

## 2012-07-16 ENCOUNTER — Encounter (HOSPITAL_COMMUNITY)
Admission: RE | Disposition: A | Discharge status: Home / Self Care | Payer: Self-pay | Source: Ambulatory Visit | Attending: Cardiology

## 2012-07-16 ENCOUNTER — Ambulatory Visit (HOSPITAL_COMMUNITY)
Admission: RE | Admit: 2012-07-16 | Discharge: 2012-07-16 | Disposition: A | Discharge status: Home / Self Care | Payer: Medicare Other | Source: Ambulatory Visit | Attending: Cardiology | Admitting: Cardiology

## 2012-07-16 ENCOUNTER — Encounter (HOSPITAL_COMMUNITY): Payer: Self-pay | Admitting: Anesthesiology

## 2012-07-16 ENCOUNTER — Ambulatory Visit (HOSPITAL_COMMUNITY): Payer: Medicare Other | Admitting: Anesthesiology

## 2012-07-16 DIAGNOSIS — E785 Hyperlipidemia, unspecified: Secondary | ICD-10-CM | POA: Diagnosis not present

## 2012-07-16 DIAGNOSIS — I495 Sick sinus syndrome: Secondary | ICD-10-CM | POA: Diagnosis not present

## 2012-07-16 DIAGNOSIS — I4891 Unspecified atrial fibrillation: Secondary | ICD-10-CM | POA: Diagnosis not present

## 2012-07-16 DIAGNOSIS — Z87891 Personal history of nicotine dependence: Secondary | ICD-10-CM | POA: Diagnosis not present

## 2012-07-16 DIAGNOSIS — I480 Paroxysmal atrial fibrillation: Secondary | ICD-10-CM | POA: Diagnosis present

## 2012-07-16 DIAGNOSIS — Z7982 Long term (current) use of aspirin: Secondary | ICD-10-CM | POA: Insufficient documentation

## 2012-07-16 DIAGNOSIS — Z9229 Personal history of other drug therapy: Secondary | ICD-10-CM | POA: Insufficient documentation

## 2012-07-16 DIAGNOSIS — I5030 Unspecified diastolic (congestive) heart failure: Secondary | ICD-10-CM | POA: Diagnosis not present

## 2012-07-16 DIAGNOSIS — I129 Hypertensive chronic kidney disease with stage 1 through stage 4 chronic kidney disease, or unspecified chronic kidney disease: Secondary | ICD-10-CM | POA: Insufficient documentation

## 2012-07-16 DIAGNOSIS — I798 Other disorders of arteries, arterioles and capillaries in diseases classified elsewhere: Secondary | ICD-10-CM | POA: Insufficient documentation

## 2012-07-16 DIAGNOSIS — N400 Enlarged prostate without lower urinary tract symptoms: Secondary | ICD-10-CM | POA: Diagnosis not present

## 2012-07-16 DIAGNOSIS — N183 Chronic kidney disease, stage 3 unspecified: Secondary | ICD-10-CM | POA: Diagnosis not present

## 2012-07-16 DIAGNOSIS — I2510 Atherosclerotic heart disease of native coronary artery without angina pectoris: Secondary | ICD-10-CM | POA: Diagnosis not present

## 2012-07-16 DIAGNOSIS — Z888 Allergy status to other drugs, medicaments and biological substances status: Secondary | ICD-10-CM | POA: Diagnosis not present

## 2012-07-16 DIAGNOSIS — Z8249 Family history of ischemic heart disease and other diseases of the circulatory system: Secondary | ICD-10-CM | POA: Diagnosis not present

## 2012-07-16 DIAGNOSIS — E1159 Type 2 diabetes mellitus with other circulatory complications: Secondary | ICD-10-CM | POA: Insufficient documentation

## 2012-07-16 DIAGNOSIS — Z7901 Long term (current) use of anticoagulants: Secondary | ICD-10-CM | POA: Insufficient documentation

## 2012-07-16 DIAGNOSIS — I509 Heart failure, unspecified: Secondary | ICD-10-CM | POA: Insufficient documentation

## 2012-07-16 DIAGNOSIS — Z79899 Other long term (current) drug therapy: Secondary | ICD-10-CM | POA: Insufficient documentation

## 2012-07-16 HISTORY — PX: CARDIOVERSION: SHX1299

## 2012-07-16 SURGERY — CARDIOVERSION
Anesthesia: General | Wound class: Clean

## 2012-07-16 MED ORDER — PROPOFOL 10 MG/ML IV BOLUS
INTRAVENOUS | Status: DC | PRN
  Administered 2012-07-16: 60 mg via INTRAVENOUS

## 2012-07-16 MED ORDER — SODIUM CHLORIDE 0.9 % IV SOLN
INTRAVENOUS | Status: DC
  Administered 2012-07-16: 08:00:00 via INTRAVENOUS

## 2012-07-16 MED ORDER — LIDOCAINE HCL (CARDIAC) 10 MG/ML IV SOLN
INTRAVENOUS | Status: DC | PRN
  Administered 2012-07-16: 80 mg via INTRAVENOUS

## 2012-07-16 NOTE — Interval H&P Note (Signed)
History and Physical Interval Note:  07/16/2012 8:36 AM  Christopher [name]  has presented today for surgery, with the diagnosis of a-fib  The various methods of treatment have been discussed with the patient and family. After consideration of risks, benefits and other options for treatment, the patient has consented to  Procedure(s): CARDIOVERSION (N/A) as a surgical intervention .  The patient's history has been reviewed, patient examined, no change in status, stable for surgery.  I have reviewed the patient's chart and labs.  Questions were answered to the patient's satisfaction.     TILLEY JR,W SPENCER

## 2012-07-16 NOTE — Preoperative (Signed)
Beta Blockers   Reason not to administer Beta Blockers:Not Applicable 

## 2012-07-16 NOTE — Anesthesia Postprocedure Evaluation (Signed)
  Anesthesia Post-op Note  Patient: Christopher [name]  Procedure(s) Performed: Procedure(s) (LRB): CARDIOVERSION (N/A)  Patient Location: PACU  Anesthesia Type: General  Level of Consciousness: awake and alert   Airway and Oxygen Therapy: Patient Spontanous Breathing  Post-op Pain: mild  Post-op Assessment: Post-op Vital signs reviewed, Patient's Cardiovascular Status Stable, Respiratory Function Stable, Patent Airway and No signs of Nausea or vomiting  Last Vitals:  Filed Vitals:   07/16/12 0855  BP: 112/67  Pulse: 70  Temp: 36.9 C  Resp: 22    Post-op Vital Signs: stable   Complications: No apparent anesthesia complications

## 2012-07-16 NOTE — CV Procedure (Signed)
Electrical Cardioversion Procedure Note  Christopher [name]   79 y.o. male MRN: [id number] DOB: 26-Nov-1926  Today's date: 07/16/2012  Procedure: Electrical Cardioversion  Indications:  Atrial Fibrillation  Time Out: Verified patient identification, verified procedure,medications/allergies/relevent history reviewed, required imaging and test results available.  Performed  Procedure Details  The patient was NPO after midnight. Anesthesia was administered at the beside  by Dr.Rose with 60 mg of propofol.  Cardioversion was done with synchronized biphasic defibrillation with AP pads with 100 watts with no conversion.  The energy was increased to 150 watts with successful conversion to normal sinus rhythm. The patient tolerated the procedure well   IMPRESSION:  Successful cardioversion of atrial fibrillation    W. Viann Fish, Montez Hageman. MD [facility]   07/16/2012, 8:40 AM

## 2012-07-16 NOTE — Transfer of Care (Signed)
Immediate Anesthesia Transfer of Care Note  Patient: Christopher [name]  Procedure(s) Performed: Procedure(s): CARDIOVERSION (N/A)  Patient Location: PACU and Endoscopy Unit  Anesthesia Type:General  Level of Consciousness: awake and alert   Airway & Oxygen Therapy: Patient Spontanous Breathing and Patient connected to face mask oxygen  Post-op Assessment: Report given to PACU RN, Post -op Vital signs reviewed and stable, Patient moving all extremities and Patient moving all extremities X 4  Post vital signs: Reviewed and stable  Complications: No apparent anesthesia complications

## 2012-07-16 NOTE — H&P (View-Only) (Signed)
Webb Laws C  Date of visit:  07/15/2012 DOB:  Apr 11, 1926    Age:  79 yrs. Medical record number:  [id number]     Account number:  [id number] Primary Care Provider: Chilton Greathouse R ____________________________ CURRENT DIAGNOSES  1. Dyspnea  2. Atrial fibrillation  3. CAD,Native  4. Chronic Kidney Disease (Stage 3)  5. Diabetes Mellitus-nidd/circ Dis/controlled  6. Hyperlipidemia  7. Hypertension-essential (benign)  8. Long-term (current) Use Of Other Medications  9. Long Term Use Anticoagulant  10. Surgery-Aortocoronary Bypass Grafting  11. Dyspnea  12. Pacemaker/cardiac insitu ____________________________ ALLERGIES  Crestor, Aches  Lipitor, Joint aches  pravastatin, Joint aches ____________________________ MEDICATIONS  1. amlodipine 10 mg Tablet, 1 p.o. daily  2. Fish Oil 1,000 mg Capsule, BID  3. glimepiride 1 mg Tablet, 1 p.o. daily  4. finasteride 5 mg Tablet, 1 p.o. daily  5. folic acid 400 mcg Tablet, 1 p.o. q.d.  6. Aspirin Low Dose 81 mg Tablet, Delayed Release (E.C.), 1 p.o. q.d.  7. horse chestnut 300 mg Capsule, 1 p.o. daily  8. multivitamin Capsule, 1 p.o. q.d.  9. Glucosamine 500 mg Tablet, BID  10. atenolol 25 mg tablet, 1 p.o. daily  11. nitroglycerin 0.4 mg tablet, sublingual, PRN  12. warfarin 5 mg tablet, 1 p.o. daily  13. triamterene-hydrochlorothiazid 37.5-25 mg tablet, 1/2 tab daily  14. benazepril 40 mg tablet, 1 p.o. daily  15. amiodarone 200 mg tablet, BID ____________________________ CHIEF COMPLAINTS  Dizzy with standing  Dyspnea  Wheezing ____________________________ HISTORY OF PRESENT ILLNESS  Patient seen for preoperative visit prior to cardioversion. He developed worsening dyspnea and was found to be in atrial fibrillation. He was started on amiodarone on April 24 and was due to be seen in 3 weeks but has had progressive shortness of breath, wheezing at night as well as some PND. He has had significant dizziness with standing and a feeling of  general unsteadiness. He has also noticed a cough at night. He has become quite fatigued and has very limited exercise ability at this time and so his cardioversion is being moved up. He had a previous cardioversion about one year ago. At that time he improved after he was placed back in sinus rhythm. He also had a junctional rhythm with exercise that caused dyspnea and that resolved with permanent pacemaking. He denies angina and his grafts were patent around a couple of years ago.  ____________________________ PAST HISTORY  Past Medical Illnesses:  hypertension, DM-non-insulin dependent, hyperlipidemia, lumbar disc disease, BPH, chronic kidney disease Stage 3, shingles;  Cardiovascular Illnesses:  CAD, sinus node dysfunction, atrial fibrillation;  Infectious Diseases:  no previous history of significant infectious diseases;  Surgical Procedures:  CABG w LIMA to LAD, SVG to OM-OM2 05/05/01 [place];  Trauma History:  no previous history of significant trauma;  NYHA Classification:  II;  Cardiology Procedures-Invasive:  cardiac cath (left) October 2011, Medtronic pacemaker implant May 2012, cardioversion May 2013;  Cardiology Procedures-Noninvasive:  treadmill cardiolite October 2010, event monitor September 2011, echocardiogram April 2013, echocardiogram May 2014;  Cardiac Cath Results:  60% left main, occluded  proximal LAD, widely patent OM 1 OM 2 SVG, widely patent RCA, widely patent LAD LIMA graft;  LVEF of 55% documented via echocardiogram on 07/03/2011,  CHADS Score:  3,  ____________________________ CARDIO-PULMONARY TEST DATES EKG Date:  07/15/2012;   Cardiac Cath Date:  12/20/2009;  CABG: 05/05/2001;  Holter/Event Monitor Date: 11/24/2009;  Nuclear Study Date:  12/29/2008;  Echocardiography Date: 07/15/2012;  Chest Xray Date: 07/02/2012;  ____________________________ FAMILY HISTORY Father - died of cancer; Mother - died of cancer; Brother 1 - CABG; Brother 2 -  alive and well;   ____________________________ SOCIAL HISTORY Alcohol Use:  does not use alcohol;  Smoking:  used to smoke but quit 1970;  Diet:  regular diet;  Lifestyle:  widowed and from first wife and remarried second wife;  Exercise:  exercises regularly;  Occupation:  retired and Personal assistant person;  Residence:  lives with wife;   ____________________________ REVIEW OF SYSTEMS General:  malaise and fatigue  Integumentary:no rashes or new skin lesions. Eyes: cataract extraction bilaterally, wears eye glasses/contact lenses Respiratory: dyspnea with exertion, wheezing Cardiovascular:  please review HPI Abdominal: denies dyspepsia, GI bleeding, constipation, or diarrhea Genitourinary-Male: nocturia  Musculoskeletal:  mild chronic low back pain Neurological:  denies headaches, stroke, or TIA,. Psychiatric:  denies depession or anxiety.  ____________________________ PHYSICAL EXAMINATION VITAL SIGNS  Blood Pressure:  110/66 Sitting, Left arm, regular cuff  , 110/70 Standing, Left arm and regular cuff   Pulse:  76/min. Weight:  238.00 lbs. Height:  74"BMI: 30  Constitutional:  pleasant white male in no acute distress Skin:  scattered hemangiomas, scattered sebaceous cysts Head:  normocephalic, normal hair pattern, no masses or tenderness Eyes:  EOMS Intact, PERRLA, C and S clear, Funduscopic exam not done. ENT:  ears, nose and throat reveal no gross abnormalities.  Dentition good. Neck:  supple, without massess. No JVD, thyromegaly or carotid bruits. Carotid upstroke normal. Chest:  clear to auscultation and percussion, healed median sternotomy scar Cardiac:  Irregular rhythm, normal S1 and S2, No S3 or S4, no murmurs, gallops or rubs detected. Abdomen:  abdomen soft,non-tender, no masses, no hepatospenomegaly, or aneurysm noted Peripheral Pulses:  the femoral,dorsalis pedis, and posterior tibial pulses are full and equal bilaterally with no bruits auscultated. Extremities & Back:  well healed  saphenous vein donor site LLE, trace edema Neurological:  no gross motor or sensory deficits noted, affect appropriate, oriented x3. ____________________________ MOST RECENT LIPID PANEL 11/06/11  CHOL TOTL 231 mg/dl, LDL 161 calc, HDL 43 mg/dl, TRIGLYCER 096 mg/dl and CHOL/HDL 5.4 (Calc) ____________________________ IMPRESSIONS/PLAN  1. Persistent atrial fibrillation 2. Coronary artery disease with previous bypass grafting 3. Functioning permanent pacemaker 4. Stage III chronic kidney disease 5. Hypertensive heart disease 6. Long-term anticoagulation with warfarin 7. Dyspnea due to fluid retention and atrial fibrillation/diastolic CHF  Recommendations:  Cardioversion discussed with the patient including risks of stroke, arrhythmia, death, or anesthesia risks. The patient understands and is willing to proceed. He does have a fairly significant left atrial size. We also discussed possibility of reversion to atrial fibrillation. He was given a dose of Lasix today.  His EF is 55% with good systolic function. ____________________________ TODAYS ORDERS  1. Comprehensive Metabolic Panel: Today  2. Complete Blood Count: Today  3. BNP: Today  4. CHEST XRAY: Today  5. Electrical Cardioversion: 1 day  6. 12 Lead EKG: Today  7. Coag Clinic Visit: Coag OV 2 weeks                       ____________________________ Cardiology Physician:  Darden Palmer MD [facility]

## 2012-07-16 NOTE — Anesthesia Postprocedure Evaluation (Signed)
  Anesthesia Post-op Note  Patient: Christopher [name]  Procedure(s) Performed: Procedure(s): CARDIOVERSION (N/A)  Patient Location: PACU and Endoscopy Unit  Anesthesia Type:General  Level of Consciousness: awake and alert   Airway and Oxygen Therapy: Patient Spontanous Breathing and Patient connected to face mask oxygen  Post-op Pain: none  Post-op Assessment: Post-op Vital signs reviewed, Patient's Cardiovascular Status Stable, Respiratory Function Stable, Patent Airway and No signs of Nausea or vomiting  Post-op Vital Signs: Reviewed and stable  Complications: No apparent anesthesia complications

## 2012-07-16 NOTE — Anesthesia Preprocedure Evaluation (Addendum)
Anesthesia Evaluation  Patient identified by MRN, date of birth, ID band Patient awake    Reviewed: Allergy & Precautions, H&P , NPO status , Patient's Chart, lab work & pertinent test results  Airway Mallampati: II TM Distance: >3 FB Neck ROM: Full    Dental no notable dental hx.    Pulmonary neg pulmonary ROS,  breath sounds clear to auscultation  Pulmonary exam normal       Cardiovascular hypertension, Pt. on medications + CAD + dysrhythmias + pacemaker Rhythm:Irregular Rate:Normal     Neuro/Psych negative neurological ROS  negative psych ROS   GI/Hepatic negative GI ROS, Neg liver ROS,   Endo/Other  negative endocrine ROS  Renal/GU Renal InsufficiencyRenal diseasenegative Renal ROS  negative genitourinary   Musculoskeletal negative musculoskeletal ROS (+)   Abdominal   Peds negative pediatric ROS (+)  Hematology negative hematology ROS (+)   Anesthesia Other Findings   Reproductive/Obstetrics negative OB ROS                           Anesthesia Physical Anesthesia Plan  ASA: III  Anesthesia Plan: General   Post-op Pain Management:    Induction: Intravenous  Airway Management Planned: Mask  Additional Equipment:   Intra-op Plan:   Post-operative Plan:   Informed Consent: I have reviewed the patients History and Physical, chart, labs and discussed the procedure including the risks, benefits and alternatives for the proposed anesthesia with the patient or authorized representative who has indicated his/her understanding and acceptance.   Dental advisory given  Plan Discussed with: CRNA and Surgeon  Anesthesia Plan Comments:         Anesthesia Quick Evaluation

## 2012-07-17 ENCOUNTER — Encounter (HOSPITAL_COMMUNITY): Payer: Self-pay | Admitting: Cardiology

## 2012-07-23 DIAGNOSIS — R0609 Other forms of dyspnea: Secondary | ICD-10-CM | POA: Diagnosis not present

## 2012-07-23 DIAGNOSIS — Z7901 Long term (current) use of anticoagulants: Secondary | ICD-10-CM | POA: Diagnosis not present

## 2012-07-23 DIAGNOSIS — R0602 Shortness of breath: Secondary | ICD-10-CM | POA: Diagnosis not present

## 2012-07-23 DIAGNOSIS — I2510 Atherosclerotic heart disease of native coronary artery without angina pectoris: Secondary | ICD-10-CM | POA: Diagnosis not present

## 2012-07-23 DIAGNOSIS — I10 Essential (primary) hypertension: Secondary | ICD-10-CM | POA: Diagnosis not present

## 2012-07-23 DIAGNOSIS — E785 Hyperlipidemia, unspecified: Secondary | ICD-10-CM | POA: Diagnosis not present

## 2012-07-23 DIAGNOSIS — R0989 Other specified symptoms and signs involving the circulatory and respiratory systems: Secondary | ICD-10-CM | POA: Diagnosis not present

## 2012-07-23 DIAGNOSIS — I4891 Unspecified atrial fibrillation: Secondary | ICD-10-CM | POA: Diagnosis not present

## 2012-07-27 DIAGNOSIS — E785 Hyperlipidemia, unspecified: Secondary | ICD-10-CM | POA: Diagnosis not present

## 2012-07-27 DIAGNOSIS — Z7901 Long term (current) use of anticoagulants: Secondary | ICD-10-CM | POA: Diagnosis not present

## 2012-07-27 DIAGNOSIS — I4891 Unspecified atrial fibrillation: Secondary | ICD-10-CM | POA: Diagnosis not present

## 2012-07-27 DIAGNOSIS — R0602 Shortness of breath: Secondary | ICD-10-CM | POA: Diagnosis not present

## 2012-07-27 DIAGNOSIS — I2510 Atherosclerotic heart disease of native coronary artery without angina pectoris: Secondary | ICD-10-CM | POA: Diagnosis not present

## 2012-07-27 DIAGNOSIS — R0989 Other specified symptoms and signs involving the circulatory and respiratory systems: Secondary | ICD-10-CM | POA: Diagnosis not present

## 2012-07-27 DIAGNOSIS — I10 Essential (primary) hypertension: Secondary | ICD-10-CM | POA: Diagnosis not present

## 2012-07-27 DIAGNOSIS — R0609 Other forms of dyspnea: Secondary | ICD-10-CM | POA: Diagnosis not present

## 2012-08-04 DIAGNOSIS — I2510 Atherosclerotic heart disease of native coronary artery without angina pectoris: Secondary | ICD-10-CM | POA: Diagnosis not present

## 2012-08-04 DIAGNOSIS — Z7901 Long term (current) use of anticoagulants: Secondary | ICD-10-CM | POA: Diagnosis not present

## 2012-08-04 DIAGNOSIS — I10 Essential (primary) hypertension: Secondary | ICD-10-CM | POA: Diagnosis not present

## 2012-08-04 DIAGNOSIS — I4891 Unspecified atrial fibrillation: Secondary | ICD-10-CM | POA: Diagnosis not present

## 2012-08-04 DIAGNOSIS — R0602 Shortness of breath: Secondary | ICD-10-CM | POA: Diagnosis not present

## 2012-08-04 DIAGNOSIS — E785 Hyperlipidemia, unspecified: Secondary | ICD-10-CM | POA: Diagnosis not present

## 2012-08-04 DIAGNOSIS — R0989 Other specified symptoms and signs involving the circulatory and respiratory systems: Secondary | ICD-10-CM | POA: Diagnosis not present

## 2012-08-11 DIAGNOSIS — R0602 Shortness of breath: Secondary | ICD-10-CM | POA: Diagnosis not present

## 2012-08-11 DIAGNOSIS — I10 Essential (primary) hypertension: Secondary | ICD-10-CM | POA: Diagnosis not present

## 2012-08-11 DIAGNOSIS — R0609 Other forms of dyspnea: Secondary | ICD-10-CM | POA: Diagnosis not present

## 2012-08-11 DIAGNOSIS — E785 Hyperlipidemia, unspecified: Secondary | ICD-10-CM | POA: Diagnosis not present

## 2012-08-11 DIAGNOSIS — I2510 Atherosclerotic heart disease of native coronary artery without angina pectoris: Secondary | ICD-10-CM | POA: Diagnosis not present

## 2012-08-11 DIAGNOSIS — R0989 Other specified symptoms and signs involving the circulatory and respiratory systems: Secondary | ICD-10-CM | POA: Diagnosis not present

## 2012-08-11 DIAGNOSIS — Z7901 Long term (current) use of anticoagulants: Secondary | ICD-10-CM | POA: Diagnosis not present

## 2012-08-11 DIAGNOSIS — I4891 Unspecified atrial fibrillation: Secondary | ICD-10-CM | POA: Diagnosis not present

## 2012-08-18 DIAGNOSIS — Z7901 Long term (current) use of anticoagulants: Secondary | ICD-10-CM | POA: Diagnosis not present

## 2012-08-18 DIAGNOSIS — I2510 Atherosclerotic heart disease of native coronary artery without angina pectoris: Secondary | ICD-10-CM | POA: Diagnosis not present

## 2012-08-18 DIAGNOSIS — I10 Essential (primary) hypertension: Secondary | ICD-10-CM | POA: Diagnosis not present

## 2012-08-18 DIAGNOSIS — E785 Hyperlipidemia, unspecified: Secondary | ICD-10-CM | POA: Diagnosis not present

## 2012-08-18 DIAGNOSIS — I4891 Unspecified atrial fibrillation: Secondary | ICD-10-CM | POA: Diagnosis not present

## 2012-08-18 DIAGNOSIS — R0602 Shortness of breath: Secondary | ICD-10-CM | POA: Diagnosis not present

## 2012-08-18 DIAGNOSIS — R0989 Other specified symptoms and signs involving the circulatory and respiratory systems: Secondary | ICD-10-CM | POA: Diagnosis not present

## 2012-08-20 DIAGNOSIS — I10 Essential (primary) hypertension: Secondary | ICD-10-CM | POA: Diagnosis not present

## 2012-08-20 DIAGNOSIS — Z6830 Body mass index (BMI) 30.0-30.9, adult: Secondary | ICD-10-CM | POA: Diagnosis not present

## 2012-08-20 DIAGNOSIS — I4891 Unspecified atrial fibrillation: Secondary | ICD-10-CM | POA: Diagnosis not present

## 2012-08-20 DIAGNOSIS — E1159 Type 2 diabetes mellitus with other circulatory complications: Secondary | ICD-10-CM | POA: Diagnosis not present

## 2012-08-20 DIAGNOSIS — M545 Low back pain: Secondary | ICD-10-CM | POA: Diagnosis not present

## 2012-08-26 DIAGNOSIS — M545 Low back pain: Secondary | ICD-10-CM | POA: Diagnosis not present

## 2012-08-28 DIAGNOSIS — N400 Enlarged prostate without lower urinary tract symptoms: Secondary | ICD-10-CM | POA: Diagnosis not present

## 2012-08-28 DIAGNOSIS — N402 Nodular prostate without lower urinary tract symptoms: Secondary | ICD-10-CM | POA: Diagnosis not present

## 2012-09-01 DIAGNOSIS — E785 Hyperlipidemia, unspecified: Secondary | ICD-10-CM | POA: Diagnosis not present

## 2012-09-01 DIAGNOSIS — I2510 Atherosclerotic heart disease of native coronary artery without angina pectoris: Secondary | ICD-10-CM | POA: Diagnosis not present

## 2012-09-01 DIAGNOSIS — I4891 Unspecified atrial fibrillation: Secondary | ICD-10-CM | POA: Diagnosis not present

## 2012-09-01 DIAGNOSIS — I10 Essential (primary) hypertension: Secondary | ICD-10-CM | POA: Diagnosis not present

## 2012-09-01 DIAGNOSIS — Z7901 Long term (current) use of anticoagulants: Secondary | ICD-10-CM | POA: Diagnosis not present

## 2012-09-01 DIAGNOSIS — R0602 Shortness of breath: Secondary | ICD-10-CM | POA: Diagnosis not present

## 2012-09-01 DIAGNOSIS — R0989 Other specified symptoms and signs involving the circulatory and respiratory systems: Secondary | ICD-10-CM | POA: Diagnosis not present

## 2012-09-04 DIAGNOSIS — N400 Enlarged prostate without lower urinary tract symptoms: Secondary | ICD-10-CM | POA: Diagnosis not present

## 2012-09-04 DIAGNOSIS — N281 Cyst of kidney, acquired: Secondary | ICD-10-CM | POA: Diagnosis not present

## 2012-09-04 DIAGNOSIS — N402 Nodular prostate without lower urinary tract symptoms: Secondary | ICD-10-CM | POA: Diagnosis not present

## 2012-09-14 DIAGNOSIS — M545 Low back pain: Secondary | ICD-10-CM | POA: Diagnosis not present

## 2012-09-15 DIAGNOSIS — E785 Hyperlipidemia, unspecified: Secondary | ICD-10-CM | POA: Diagnosis not present

## 2012-09-15 DIAGNOSIS — R0602 Shortness of breath: Secondary | ICD-10-CM | POA: Diagnosis not present

## 2012-09-15 DIAGNOSIS — R0609 Other forms of dyspnea: Secondary | ICD-10-CM | POA: Diagnosis not present

## 2012-09-15 DIAGNOSIS — I2510 Atherosclerotic heart disease of native coronary artery without angina pectoris: Secondary | ICD-10-CM | POA: Diagnosis not present

## 2012-09-15 DIAGNOSIS — I10 Essential (primary) hypertension: Secondary | ICD-10-CM | POA: Diagnosis not present

## 2012-09-15 DIAGNOSIS — I4891 Unspecified atrial fibrillation: Secondary | ICD-10-CM | POA: Diagnosis not present

## 2012-09-15 DIAGNOSIS — Z7901 Long term (current) use of anticoagulants: Secondary | ICD-10-CM | POA: Diagnosis not present

## 2012-09-24 DIAGNOSIS — I4891 Unspecified atrial fibrillation: Secondary | ICD-10-CM | POA: Diagnosis not present

## 2012-09-24 DIAGNOSIS — I2510 Atherosclerotic heart disease of native coronary artery without angina pectoris: Secondary | ICD-10-CM | POA: Diagnosis not present

## 2012-09-24 DIAGNOSIS — R0602 Shortness of breath: Secondary | ICD-10-CM | POA: Diagnosis not present

## 2012-09-24 DIAGNOSIS — Z950 Presence of cardiac pacemaker: Secondary | ICD-10-CM | POA: Diagnosis not present

## 2012-09-24 DIAGNOSIS — I10 Essential (primary) hypertension: Secondary | ICD-10-CM | POA: Diagnosis not present

## 2012-09-24 DIAGNOSIS — Z7901 Long term (current) use of anticoagulants: Secondary | ICD-10-CM | POA: Diagnosis not present

## 2012-09-24 DIAGNOSIS — E785 Hyperlipidemia, unspecified: Secondary | ICD-10-CM | POA: Diagnosis not present

## 2012-09-24 DIAGNOSIS — R0609 Other forms of dyspnea: Secondary | ICD-10-CM | POA: Diagnosis not present

## 2012-10-13 DIAGNOSIS — Z7901 Long term (current) use of anticoagulants: Secondary | ICD-10-CM | POA: Diagnosis not present

## 2012-10-13 DIAGNOSIS — I2510 Atherosclerotic heart disease of native coronary artery without angina pectoris: Secondary | ICD-10-CM | POA: Diagnosis not present

## 2012-10-13 DIAGNOSIS — R0602 Shortness of breath: Secondary | ICD-10-CM | POA: Diagnosis not present

## 2012-10-13 DIAGNOSIS — I10 Essential (primary) hypertension: Secondary | ICD-10-CM | POA: Diagnosis not present

## 2012-10-13 DIAGNOSIS — R0989 Other specified symptoms and signs involving the circulatory and respiratory systems: Secondary | ICD-10-CM | POA: Diagnosis not present

## 2012-10-13 DIAGNOSIS — E785 Hyperlipidemia, unspecified: Secondary | ICD-10-CM | POA: Diagnosis not present

## 2012-10-13 DIAGNOSIS — R0609 Other forms of dyspnea: Secondary | ICD-10-CM | POA: Diagnosis not present

## 2012-10-13 DIAGNOSIS — I4891 Unspecified atrial fibrillation: Secondary | ICD-10-CM | POA: Diagnosis not present

## 2012-10-21 DIAGNOSIS — M545 Low back pain: Secondary | ICD-10-CM | POA: Diagnosis not present

## 2012-10-27 DIAGNOSIS — Z951 Presence of aortocoronary bypass graft: Secondary | ICD-10-CM | POA: Diagnosis not present

## 2012-10-27 DIAGNOSIS — Z950 Presence of cardiac pacemaker: Secondary | ICD-10-CM | POA: Diagnosis not present

## 2012-10-27 DIAGNOSIS — I10 Essential (primary) hypertension: Secondary | ICD-10-CM | POA: Diagnosis not present

## 2012-10-27 DIAGNOSIS — Z7901 Long term (current) use of anticoagulants: Secondary | ICD-10-CM | POA: Diagnosis not present

## 2012-10-27 DIAGNOSIS — I2510 Atherosclerotic heart disease of native coronary artery without angina pectoris: Secondary | ICD-10-CM | POA: Diagnosis not present

## 2012-10-27 DIAGNOSIS — R0602 Shortness of breath: Secondary | ICD-10-CM | POA: Diagnosis not present

## 2012-10-27 DIAGNOSIS — I4891 Unspecified atrial fibrillation: Secondary | ICD-10-CM | POA: Diagnosis not present

## 2012-10-27 DIAGNOSIS — R0609 Other forms of dyspnea: Secondary | ICD-10-CM | POA: Diagnosis not present

## 2012-10-27 DIAGNOSIS — E785 Hyperlipidemia, unspecified: Secondary | ICD-10-CM | POA: Diagnosis not present

## 2012-11-13 DIAGNOSIS — M533 Sacrococcygeal disorders, not elsewhere classified: Secondary | ICD-10-CM | POA: Diagnosis not present

## 2012-11-13 DIAGNOSIS — M549 Dorsalgia, unspecified: Secondary | ICD-10-CM | POA: Diagnosis not present

## 2012-11-24 DIAGNOSIS — I2510 Atherosclerotic heart disease of native coronary artery without angina pectoris: Secondary | ICD-10-CM | POA: Diagnosis not present

## 2012-11-24 DIAGNOSIS — Z951 Presence of aortocoronary bypass graft: Secondary | ICD-10-CM | POA: Diagnosis not present

## 2012-11-24 DIAGNOSIS — E785 Hyperlipidemia, unspecified: Secondary | ICD-10-CM | POA: Diagnosis not present

## 2012-11-24 DIAGNOSIS — I4891 Unspecified atrial fibrillation: Secondary | ICD-10-CM | POA: Diagnosis not present

## 2012-11-24 DIAGNOSIS — I10 Essential (primary) hypertension: Secondary | ICD-10-CM | POA: Diagnosis not present

## 2012-11-24 DIAGNOSIS — Z950 Presence of cardiac pacemaker: Secondary | ICD-10-CM | POA: Diagnosis not present

## 2012-11-24 DIAGNOSIS — Z7901 Long term (current) use of anticoagulants: Secondary | ICD-10-CM | POA: Diagnosis not present

## 2012-11-26 DIAGNOSIS — Z23 Encounter for immunization: Secondary | ICD-10-CM | POA: Diagnosis not present

## 2012-12-21 DIAGNOSIS — M545 Low back pain: Secondary | ICD-10-CM | POA: Diagnosis not present

## 2012-12-21 DIAGNOSIS — M533 Sacrococcygeal disorders, not elsewhere classified: Secondary | ICD-10-CM | POA: Diagnosis not present

## 2012-12-22 DIAGNOSIS — Z951 Presence of aortocoronary bypass graft: Secondary | ICD-10-CM | POA: Diagnosis not present

## 2012-12-22 DIAGNOSIS — Z950 Presence of cardiac pacemaker: Secondary | ICD-10-CM | POA: Diagnosis not present

## 2012-12-22 DIAGNOSIS — I4891 Unspecified atrial fibrillation: Secondary | ICD-10-CM | POA: Diagnosis not present

## 2012-12-22 DIAGNOSIS — Z7901 Long term (current) use of anticoagulants: Secondary | ICD-10-CM | POA: Diagnosis not present

## 2012-12-22 DIAGNOSIS — I10 Essential (primary) hypertension: Secondary | ICD-10-CM | POA: Diagnosis not present

## 2012-12-22 DIAGNOSIS — E785 Hyperlipidemia, unspecified: Secondary | ICD-10-CM | POA: Diagnosis not present

## 2012-12-22 DIAGNOSIS — I2510 Atherosclerotic heart disease of native coronary artery without angina pectoris: Secondary | ICD-10-CM | POA: Diagnosis not present

## 2012-12-24 DIAGNOSIS — Z7901 Long term (current) use of anticoagulants: Secondary | ICD-10-CM | POA: Diagnosis not present

## 2012-12-24 DIAGNOSIS — Z951 Presence of aortocoronary bypass graft: Secondary | ICD-10-CM | POA: Diagnosis not present

## 2012-12-24 DIAGNOSIS — E785 Hyperlipidemia, unspecified: Secondary | ICD-10-CM | POA: Diagnosis not present

## 2012-12-24 DIAGNOSIS — Z950 Presence of cardiac pacemaker: Secondary | ICD-10-CM | POA: Diagnosis not present

## 2012-12-24 DIAGNOSIS — I2510 Atherosclerotic heart disease of native coronary artery without angina pectoris: Secondary | ICD-10-CM | POA: Diagnosis not present

## 2012-12-24 DIAGNOSIS — I4891 Unspecified atrial fibrillation: Secondary | ICD-10-CM | POA: Diagnosis not present

## 2012-12-24 DIAGNOSIS — I10 Essential (primary) hypertension: Secondary | ICD-10-CM | POA: Diagnosis not present

## 2013-01-05 DIAGNOSIS — I4891 Unspecified atrial fibrillation: Secondary | ICD-10-CM | POA: Diagnosis not present

## 2013-01-05 DIAGNOSIS — E785 Hyperlipidemia, unspecified: Secondary | ICD-10-CM | POA: Diagnosis not present

## 2013-01-05 DIAGNOSIS — I10 Essential (primary) hypertension: Secondary | ICD-10-CM | POA: Diagnosis not present

## 2013-01-05 DIAGNOSIS — I2510 Atherosclerotic heart disease of native coronary artery without angina pectoris: Secondary | ICD-10-CM | POA: Diagnosis not present

## 2013-01-05 DIAGNOSIS — Z7901 Long term (current) use of anticoagulants: Secondary | ICD-10-CM | POA: Diagnosis not present

## 2013-01-05 DIAGNOSIS — Z950 Presence of cardiac pacemaker: Secondary | ICD-10-CM | POA: Diagnosis not present

## 2013-01-05 DIAGNOSIS — Z951 Presence of aortocoronary bypass graft: Secondary | ICD-10-CM | POA: Diagnosis not present

## 2013-01-07 DIAGNOSIS — N184 Chronic kidney disease, stage 4 (severe): Secondary | ICD-10-CM | POA: Diagnosis not present

## 2013-01-07 DIAGNOSIS — I2510 Atherosclerotic heart disease of native coronary artery without angina pectoris: Secondary | ICD-10-CM | POA: Diagnosis not present

## 2013-01-07 DIAGNOSIS — E785 Hyperlipidemia, unspecified: Secondary | ICD-10-CM | POA: Diagnosis not present

## 2013-01-07 DIAGNOSIS — E1129 Type 2 diabetes mellitus with other diabetic kidney complication: Secondary | ICD-10-CM | POA: Diagnosis not present

## 2013-01-07 DIAGNOSIS — Z125 Encounter for screening for malignant neoplasm of prostate: Secondary | ICD-10-CM | POA: Diagnosis not present

## 2013-01-13 DIAGNOSIS — Z0000 Encounter for general adult medical examination without abnormal findings: Secondary | ICD-10-CM | POA: Diagnosis not present

## 2013-01-13 DIAGNOSIS — I25810 Atherosclerosis of coronary artery bypass graft(s) without angina pectoris: Secondary | ICD-10-CM | POA: Diagnosis not present

## 2013-01-13 DIAGNOSIS — I4891 Unspecified atrial fibrillation: Secondary | ICD-10-CM | POA: Diagnosis not present

## 2013-01-13 DIAGNOSIS — E785 Hyperlipidemia, unspecified: Secondary | ICD-10-CM | POA: Diagnosis not present

## 2013-01-13 DIAGNOSIS — N184 Chronic kidney disease, stage 4 (severe): Secondary | ICD-10-CM | POA: Diagnosis not present

## 2013-01-13 DIAGNOSIS — E1159 Type 2 diabetes mellitus with other circulatory complications: Secondary | ICD-10-CM | POA: Diagnosis not present

## 2013-01-13 DIAGNOSIS — I10 Essential (primary) hypertension: Secondary | ICD-10-CM | POA: Diagnosis not present

## 2013-01-13 DIAGNOSIS — M545 Low back pain: Secondary | ICD-10-CM | POA: Diagnosis not present

## 2013-01-14 DIAGNOSIS — Z1212 Encounter for screening for malignant neoplasm of rectum: Secondary | ICD-10-CM | POA: Diagnosis not present

## 2013-01-19 ENCOUNTER — Ambulatory Visit
Admission: RE | Admit: 2013-01-19 | Discharge: 2013-01-19 | Disposition: A | Discharge status: Home / Self Care | Payer: Medicare Other | Source: Ambulatory Visit | Attending: Internal Medicine | Admitting: Internal Medicine

## 2013-01-19 ENCOUNTER — Other Ambulatory Visit: Payer: Self-pay | Admitting: Internal Medicine

## 2013-01-19 DIAGNOSIS — N184 Chronic kidney disease, stage 4 (severe): Secondary | ICD-10-CM

## 2013-01-19 DIAGNOSIS — Z950 Presence of cardiac pacemaker: Secondary | ICD-10-CM | POA: Diagnosis not present

## 2013-01-19 DIAGNOSIS — E785 Hyperlipidemia, unspecified: Secondary | ICD-10-CM | POA: Diagnosis not present

## 2013-01-19 DIAGNOSIS — I10 Essential (primary) hypertension: Secondary | ICD-10-CM | POA: Diagnosis not present

## 2013-01-19 DIAGNOSIS — I4891 Unspecified atrial fibrillation: Secondary | ICD-10-CM | POA: Diagnosis not present

## 2013-01-19 DIAGNOSIS — Z7901 Long term (current) use of anticoagulants: Secondary | ICD-10-CM | POA: Diagnosis not present

## 2013-01-19 DIAGNOSIS — N281 Cyst of kidney, acquired: Secondary | ICD-10-CM | POA: Diagnosis not present

## 2013-01-19 DIAGNOSIS — I2510 Atherosclerotic heart disease of native coronary artery without angina pectoris: Secondary | ICD-10-CM | POA: Diagnosis not present

## 2013-01-19 DIAGNOSIS — Z951 Presence of aortocoronary bypass graft: Secondary | ICD-10-CM | POA: Diagnosis not present

## 2013-01-20 ENCOUNTER — Ambulatory Visit (INDEPENDENT_AMBULATORY_CARE_PROVIDER_SITE_OTHER): Payer: Medicare Other | Admitting: Pharmacist Clinician (PhC)/ Clinical Pharmacy Specialist

## 2013-01-20 DIAGNOSIS — Z7901 Long term (current) use of anticoagulants: Secondary | ICD-10-CM

## 2013-01-20 DIAGNOSIS — M533 Sacrococcygeal disorders, not elsewhere classified: Secondary | ICD-10-CM | POA: Diagnosis not present

## 2013-01-20 DIAGNOSIS — I4891 Unspecified atrial fibrillation: Secondary | ICD-10-CM

## 2013-02-02 DIAGNOSIS — I4891 Unspecified atrial fibrillation: Secondary | ICD-10-CM | POA: Diagnosis not present

## 2013-02-02 DIAGNOSIS — I2510 Atherosclerotic heart disease of native coronary artery without angina pectoris: Secondary | ICD-10-CM | POA: Diagnosis not present

## 2013-02-02 DIAGNOSIS — E785 Hyperlipidemia, unspecified: Secondary | ICD-10-CM | POA: Diagnosis not present

## 2013-02-02 DIAGNOSIS — M533 Sacrococcygeal disorders, not elsewhere classified: Secondary | ICD-10-CM | POA: Diagnosis not present

## 2013-02-02 DIAGNOSIS — Z950 Presence of cardiac pacemaker: Secondary | ICD-10-CM | POA: Diagnosis not present

## 2013-02-02 DIAGNOSIS — Z7901 Long term (current) use of anticoagulants: Secondary | ICD-10-CM | POA: Diagnosis not present

## 2013-02-02 DIAGNOSIS — Z951 Presence of aortocoronary bypass graft: Secondary | ICD-10-CM | POA: Diagnosis not present

## 2013-02-02 DIAGNOSIS — I10 Essential (primary) hypertension: Secondary | ICD-10-CM | POA: Diagnosis not present

## 2013-02-09 DIAGNOSIS — Z950 Presence of cardiac pacemaker: Secondary | ICD-10-CM | POA: Diagnosis not present

## 2013-02-09 DIAGNOSIS — I10 Essential (primary) hypertension: Secondary | ICD-10-CM | POA: Diagnosis not present

## 2013-02-09 DIAGNOSIS — I2510 Atherosclerotic heart disease of native coronary artery without angina pectoris: Secondary | ICD-10-CM | POA: Diagnosis not present

## 2013-02-09 DIAGNOSIS — I4891 Unspecified atrial fibrillation: Secondary | ICD-10-CM | POA: Diagnosis not present

## 2013-02-09 DIAGNOSIS — E785 Hyperlipidemia, unspecified: Secondary | ICD-10-CM | POA: Diagnosis not present

## 2013-02-09 DIAGNOSIS — Z7901 Long term (current) use of anticoagulants: Secondary | ICD-10-CM | POA: Diagnosis not present

## 2013-02-09 DIAGNOSIS — Z951 Presence of aortocoronary bypass graft: Secondary | ICD-10-CM | POA: Diagnosis not present

## 2013-02-12 DIAGNOSIS — E1129 Type 2 diabetes mellitus with other diabetic kidney complication: Secondary | ICD-10-CM | POA: Diagnosis not present

## 2013-02-12 DIAGNOSIS — N184 Chronic kidney disease, stage 4 (severe): Secondary | ICD-10-CM | POA: Diagnosis not present

## 2013-02-12 DIAGNOSIS — I129 Hypertensive chronic kidney disease with stage 1 through stage 4 chronic kidney disease, or unspecified chronic kidney disease: Secondary | ICD-10-CM | POA: Diagnosis not present

## 2013-02-12 DIAGNOSIS — N2581 Secondary hyperparathyroidism of renal origin: Secondary | ICD-10-CM | POA: Diagnosis not present

## 2013-02-17 DIAGNOSIS — E875 Hyperkalemia: Secondary | ICD-10-CM | POA: Diagnosis not present

## 2013-02-23 DIAGNOSIS — I4891 Unspecified atrial fibrillation: Secondary | ICD-10-CM | POA: Diagnosis not present

## 2013-02-23 DIAGNOSIS — I2510 Atherosclerotic heart disease of native coronary artery without angina pectoris: Secondary | ICD-10-CM | POA: Diagnosis not present

## 2013-02-23 DIAGNOSIS — E785 Hyperlipidemia, unspecified: Secondary | ICD-10-CM | POA: Diagnosis not present

## 2013-02-23 DIAGNOSIS — Z950 Presence of cardiac pacemaker: Secondary | ICD-10-CM | POA: Diagnosis not present

## 2013-02-23 DIAGNOSIS — I10 Essential (primary) hypertension: Secondary | ICD-10-CM | POA: Diagnosis not present

## 2013-02-23 DIAGNOSIS — Z951 Presence of aortocoronary bypass graft: Secondary | ICD-10-CM | POA: Diagnosis not present

## 2013-02-23 DIAGNOSIS — Z7901 Long term (current) use of anticoagulants: Secondary | ICD-10-CM | POA: Diagnosis not present

## 2013-03-09 DIAGNOSIS — R972 Elevated prostate specific antigen [PSA]: Secondary | ICD-10-CM | POA: Diagnosis not present

## 2013-03-09 DIAGNOSIS — R3129 Other microscopic hematuria: Secondary | ICD-10-CM | POA: Diagnosis not present

## 2013-03-09 DIAGNOSIS — N402 Nodular prostate without lower urinary tract symptoms: Secondary | ICD-10-CM | POA: Diagnosis not present

## 2013-03-09 DIAGNOSIS — N400 Enlarged prostate without lower urinary tract symptoms: Secondary | ICD-10-CM | POA: Diagnosis not present

## 2013-03-17 DIAGNOSIS — D239 Other benign neoplasm of skin, unspecified: Secondary | ICD-10-CM | POA: Diagnosis not present

## 2013-03-17 DIAGNOSIS — L821 Other seborrheic keratosis: Secondary | ICD-10-CM | POA: Diagnosis not present

## 2013-03-17 DIAGNOSIS — L570 Actinic keratosis: Secondary | ICD-10-CM | POA: Diagnosis not present

## 2013-03-17 DIAGNOSIS — L820 Inflamed seborrheic keratosis: Secondary | ICD-10-CM | POA: Diagnosis not present

## 2013-03-17 DIAGNOSIS — D235 Other benign neoplasm of skin of trunk: Secondary | ICD-10-CM | POA: Diagnosis not present

## 2013-03-17 DIAGNOSIS — D2370 Other benign neoplasm of skin of unspecified lower limb, including hip: Secondary | ICD-10-CM | POA: Diagnosis not present

## 2013-03-17 DIAGNOSIS — D1801 Hemangioma of skin and subcutaneous tissue: Secondary | ICD-10-CM | POA: Diagnosis not present

## 2013-03-17 DIAGNOSIS — Z85828 Personal history of other malignant neoplasm of skin: Secondary | ICD-10-CM | POA: Diagnosis not present

## 2013-03-23 DIAGNOSIS — R3129 Other microscopic hematuria: Secondary | ICD-10-CM | POA: Diagnosis not present

## 2013-03-23 DIAGNOSIS — I2510 Atherosclerotic heart disease of native coronary artery without angina pectoris: Secondary | ICD-10-CM | POA: Diagnosis not present

## 2013-03-23 DIAGNOSIS — I10 Essential (primary) hypertension: Secondary | ICD-10-CM | POA: Diagnosis not present

## 2013-03-23 DIAGNOSIS — Z7901 Long term (current) use of anticoagulants: Secondary | ICD-10-CM | POA: Diagnosis not present

## 2013-03-23 DIAGNOSIS — Z951 Presence of aortocoronary bypass graft: Secondary | ICD-10-CM | POA: Diagnosis not present

## 2013-03-23 DIAGNOSIS — Z950 Presence of cardiac pacemaker: Secondary | ICD-10-CM | POA: Diagnosis not present

## 2013-03-23 DIAGNOSIS — I4891 Unspecified atrial fibrillation: Secondary | ICD-10-CM | POA: Diagnosis not present

## 2013-03-23 DIAGNOSIS — N183 Chronic kidney disease, stage 3 unspecified: Secondary | ICD-10-CM | POA: Diagnosis not present

## 2013-03-23 DIAGNOSIS — E785 Hyperlipidemia, unspecified: Secondary | ICD-10-CM | POA: Diagnosis not present

## 2013-03-24 DIAGNOSIS — N184 Chronic kidney disease, stage 4 (severe): Secondary | ICD-10-CM | POA: Diagnosis not present

## 2013-03-26 DIAGNOSIS — E785 Hyperlipidemia, unspecified: Secondary | ICD-10-CM | POA: Diagnosis not present

## 2013-03-26 DIAGNOSIS — R0609 Other forms of dyspnea: Secondary | ICD-10-CM | POA: Diagnosis not present

## 2013-03-26 DIAGNOSIS — Z01810 Encounter for preprocedural cardiovascular examination: Secondary | ICD-10-CM | POA: Diagnosis not present

## 2013-03-26 DIAGNOSIS — Z950 Presence of cardiac pacemaker: Secondary | ICD-10-CM | POA: Diagnosis not present

## 2013-03-26 DIAGNOSIS — I4891 Unspecified atrial fibrillation: Secondary | ICD-10-CM | POA: Diagnosis not present

## 2013-03-26 DIAGNOSIS — R0989 Other specified symptoms and signs involving the circulatory and respiratory systems: Secondary | ICD-10-CM | POA: Diagnosis not present

## 2013-03-26 DIAGNOSIS — I10 Essential (primary) hypertension: Secondary | ICD-10-CM | POA: Diagnosis not present

## 2013-03-26 DIAGNOSIS — I119 Hypertensive heart disease without heart failure: Secondary | ICD-10-CM | POA: Diagnosis not present

## 2013-03-26 DIAGNOSIS — N183 Chronic kidney disease, stage 3 unspecified: Secondary | ICD-10-CM | POA: Diagnosis not present

## 2013-03-26 DIAGNOSIS — I2510 Atherosclerotic heart disease of native coronary artery without angina pectoris: Secondary | ICD-10-CM | POA: Diagnosis not present

## 2013-03-29 ENCOUNTER — Ambulatory Visit
Admission: RE | Admit: 2013-03-29 | Discharge: 2013-03-29 | Disposition: A | Discharge status: Home / Self Care | Payer: Medicare Other | Source: Ambulatory Visit | Attending: Cardiology | Admitting: Cardiology

## 2013-03-29 ENCOUNTER — Other Ambulatory Visit: Payer: Self-pay | Admitting: Cardiology

## 2013-03-29 DIAGNOSIS — R0609 Other forms of dyspnea: Secondary | ICD-10-CM | POA: Diagnosis not present

## 2013-03-29 DIAGNOSIS — I119 Hypertensive heart disease without heart failure: Secondary | ICD-10-CM | POA: Diagnosis not present

## 2013-03-29 DIAGNOSIS — R918 Other nonspecific abnormal finding of lung field: Secondary | ICD-10-CM | POA: Diagnosis not present

## 2013-03-29 DIAGNOSIS — Z01810 Encounter for preprocedural cardiovascular examination: Secondary | ICD-10-CM | POA: Diagnosis not present

## 2013-03-29 DIAGNOSIS — E785 Hyperlipidemia, unspecified: Secondary | ICD-10-CM | POA: Diagnosis not present

## 2013-03-29 DIAGNOSIS — I2510 Atherosclerotic heart disease of native coronary artery without angina pectoris: Secondary | ICD-10-CM | POA: Diagnosis not present

## 2013-03-29 DIAGNOSIS — R0989 Other specified symptoms and signs involving the circulatory and respiratory systems: Principal | ICD-10-CM

## 2013-03-29 DIAGNOSIS — I4891 Unspecified atrial fibrillation: Secondary | ICD-10-CM | POA: Diagnosis not present

## 2013-03-29 DIAGNOSIS — N183 Chronic kidney disease, stage 3 unspecified: Secondary | ICD-10-CM | POA: Diagnosis not present

## 2013-03-29 DIAGNOSIS — I10 Essential (primary) hypertension: Secondary | ICD-10-CM | POA: Diagnosis not present

## 2013-03-30 DIAGNOSIS — M545 Low back pain, unspecified: Secondary | ICD-10-CM | POA: Diagnosis not present

## 2013-03-30 DIAGNOSIS — M48061 Spinal stenosis, lumbar region without neurogenic claudication: Secondary | ICD-10-CM | POA: Diagnosis not present

## 2013-03-30 DIAGNOSIS — M533 Sacrococcygeal disorders, not elsewhere classified: Secondary | ICD-10-CM | POA: Diagnosis not present

## 2013-04-01 DIAGNOSIS — N401 Enlarged prostate with lower urinary tract symptoms: Secondary | ICD-10-CM | POA: Diagnosis not present

## 2013-04-01 DIAGNOSIS — J189 Pneumonia, unspecified organism: Secondary | ICD-10-CM | POA: Diagnosis not present

## 2013-04-01 DIAGNOSIS — N184 Chronic kidney disease, stage 4 (severe): Secondary | ICD-10-CM | POA: Diagnosis not present

## 2013-04-01 DIAGNOSIS — Z6830 Body mass index (BMI) 30.0-30.9, adult: Secondary | ICD-10-CM | POA: Diagnosis not present

## 2013-04-08 DIAGNOSIS — N184 Chronic kidney disease, stage 4 (severe): Secondary | ICD-10-CM | POA: Diagnosis not present

## 2013-04-08 DIAGNOSIS — J189 Pneumonia, unspecified organism: Secondary | ICD-10-CM | POA: Diagnosis not present

## 2013-04-08 DIAGNOSIS — Z7901 Long term (current) use of anticoagulants: Secondary | ICD-10-CM | POA: Diagnosis not present

## 2013-04-08 DIAGNOSIS — I4891 Unspecified atrial fibrillation: Secondary | ICD-10-CM | POA: Diagnosis not present

## 2013-04-15 DIAGNOSIS — N184 Chronic kidney disease, stage 4 (severe): Secondary | ICD-10-CM | POA: Diagnosis not present

## 2013-04-19 ENCOUNTER — Other Ambulatory Visit: Payer: Self-pay | Admitting: Nephrology

## 2013-04-19 DIAGNOSIS — R7989 Other specified abnormal findings of blood chemistry: Secondary | ICD-10-CM

## 2013-04-20 DIAGNOSIS — R0609 Other forms of dyspnea: Secondary | ICD-10-CM | POA: Diagnosis not present

## 2013-04-20 DIAGNOSIS — I10 Essential (primary) hypertension: Secondary | ICD-10-CM | POA: Diagnosis not present

## 2013-04-20 DIAGNOSIS — N183 Chronic kidney disease, stage 3 unspecified: Secondary | ICD-10-CM | POA: Diagnosis not present

## 2013-04-20 DIAGNOSIS — Z01810 Encounter for preprocedural cardiovascular examination: Secondary | ICD-10-CM | POA: Diagnosis not present

## 2013-04-20 DIAGNOSIS — I119 Hypertensive heart disease without heart failure: Secondary | ICD-10-CM | POA: Diagnosis not present

## 2013-04-20 DIAGNOSIS — I2510 Atherosclerotic heart disease of native coronary artery without angina pectoris: Secondary | ICD-10-CM | POA: Diagnosis not present

## 2013-04-20 DIAGNOSIS — E785 Hyperlipidemia, unspecified: Secondary | ICD-10-CM | POA: Diagnosis not present

## 2013-04-20 DIAGNOSIS — I4891 Unspecified atrial fibrillation: Secondary | ICD-10-CM | POA: Diagnosis not present

## 2013-04-20 DIAGNOSIS — Z7901 Long term (current) use of anticoagulants: Secondary | ICD-10-CM | POA: Diagnosis not present

## 2013-04-21 ENCOUNTER — Ambulatory Visit
Admission: RE | Admit: 2013-04-21 | Discharge: 2013-04-21 | Disposition: A | Discharge status: Home / Self Care | Payer: Medicare Other | Source: Ambulatory Visit | Attending: Nephrology | Admitting: Nephrology

## 2013-04-21 DIAGNOSIS — R7989 Other specified abnormal findings of blood chemistry: Secondary | ICD-10-CM

## 2013-04-21 DIAGNOSIS — R748 Abnormal levels of other serum enzymes: Secondary | ICD-10-CM | POA: Diagnosis not present

## 2013-05-11 DIAGNOSIS — I119 Hypertensive heart disease without heart failure: Secondary | ICD-10-CM | POA: Diagnosis not present

## 2013-05-11 DIAGNOSIS — E785 Hyperlipidemia, unspecified: Secondary | ICD-10-CM | POA: Diagnosis not present

## 2013-05-11 DIAGNOSIS — I129 Hypertensive chronic kidney disease with stage 1 through stage 4 chronic kidney disease, or unspecified chronic kidney disease: Secondary | ICD-10-CM | POA: Diagnosis not present

## 2013-05-11 DIAGNOSIS — I4891 Unspecified atrial fibrillation: Secondary | ICD-10-CM | POA: Diagnosis not present

## 2013-05-11 DIAGNOSIS — N184 Chronic kidney disease, stage 4 (severe): Secondary | ICD-10-CM | POA: Diagnosis not present

## 2013-05-11 DIAGNOSIS — R0609 Other forms of dyspnea: Secondary | ICD-10-CM | POA: Diagnosis not present

## 2013-05-11 DIAGNOSIS — I2510 Atherosclerotic heart disease of native coronary artery without angina pectoris: Secondary | ICD-10-CM | POA: Diagnosis not present

## 2013-05-11 DIAGNOSIS — N183 Chronic kidney disease, stage 3 unspecified: Secondary | ICD-10-CM | POA: Diagnosis not present

## 2013-05-11 DIAGNOSIS — Z01810 Encounter for preprocedural cardiovascular examination: Secondary | ICD-10-CM | POA: Diagnosis not present

## 2013-05-11 DIAGNOSIS — Z7901 Long term (current) use of anticoagulants: Secondary | ICD-10-CM | POA: Diagnosis not present

## 2013-05-11 DIAGNOSIS — E1129 Type 2 diabetes mellitus with other diabetic kidney complication: Secondary | ICD-10-CM | POA: Diagnosis not present

## 2013-05-11 DIAGNOSIS — I10 Essential (primary) hypertension: Secondary | ICD-10-CM | POA: Diagnosis not present

## 2013-05-20 ENCOUNTER — Other Ambulatory Visit: Payer: Self-pay | Admitting: Urology

## 2013-05-26 DIAGNOSIS — N183 Chronic kidney disease, stage 3 unspecified: Secondary | ICD-10-CM | POA: Diagnosis not present

## 2013-05-26 DIAGNOSIS — R0989 Other specified symptoms and signs involving the circulatory and respiratory systems: Secondary | ICD-10-CM | POA: Diagnosis not present

## 2013-05-26 DIAGNOSIS — Z7901 Long term (current) use of anticoagulants: Secondary | ICD-10-CM | POA: Diagnosis not present

## 2013-05-26 DIAGNOSIS — R0609 Other forms of dyspnea: Secondary | ICD-10-CM | POA: Diagnosis not present

## 2013-05-26 DIAGNOSIS — I2510 Atherosclerotic heart disease of native coronary artery without angina pectoris: Secondary | ICD-10-CM | POA: Diagnosis not present

## 2013-05-26 DIAGNOSIS — I4891 Unspecified atrial fibrillation: Secondary | ICD-10-CM | POA: Diagnosis not present

## 2013-05-26 DIAGNOSIS — E785 Hyperlipidemia, unspecified: Secondary | ICD-10-CM | POA: Diagnosis not present

## 2013-05-26 DIAGNOSIS — I119 Hypertensive heart disease without heart failure: Secondary | ICD-10-CM | POA: Diagnosis not present

## 2013-05-26 DIAGNOSIS — Z01810 Encounter for preprocedural cardiovascular examination: Secondary | ICD-10-CM | POA: Diagnosis not present

## 2013-05-26 DIAGNOSIS — I10 Essential (primary) hypertension: Secondary | ICD-10-CM | POA: Diagnosis not present

## 2013-05-28 ENCOUNTER — Ambulatory Visit
Admission: RE | Admit: 2013-05-28 | Discharge: 2013-05-28 | Disposition: A | Discharge status: Home / Self Care | Payer: Medicare Other | Source: Ambulatory Visit | Attending: Internal Medicine | Admitting: Internal Medicine

## 2013-05-28 ENCOUNTER — Other Ambulatory Visit: Payer: Self-pay | Admitting: Internal Medicine

## 2013-05-28 DIAGNOSIS — Z7901 Long term (current) use of anticoagulants: Secondary | ICD-10-CM | POA: Diagnosis not present

## 2013-05-28 DIAGNOSIS — J189 Pneumonia, unspecified organism: Secondary | ICD-10-CM

## 2013-05-28 DIAGNOSIS — E785 Hyperlipidemia, unspecified: Secondary | ICD-10-CM | POA: Diagnosis not present

## 2013-05-28 DIAGNOSIS — I2510 Atherosclerotic heart disease of native coronary artery without angina pectoris: Secondary | ICD-10-CM | POA: Diagnosis not present

## 2013-05-28 DIAGNOSIS — I10 Essential (primary) hypertension: Secondary | ICD-10-CM | POA: Diagnosis not present

## 2013-05-28 DIAGNOSIS — Z1331 Encounter for screening for depression: Secondary | ICD-10-CM | POA: Diagnosis not present

## 2013-05-28 DIAGNOSIS — E1129 Type 2 diabetes mellitus with other diabetic kidney complication: Secondary | ICD-10-CM | POA: Diagnosis not present

## 2013-05-28 DIAGNOSIS — N184 Chronic kidney disease, stage 4 (severe): Secondary | ICD-10-CM | POA: Diagnosis not present

## 2013-06-08 ENCOUNTER — Encounter (HOSPITAL_COMMUNITY): Payer: Self-pay | Admitting: Pharmacy Technician

## 2013-06-09 DIAGNOSIS — Z7901 Long term (current) use of anticoagulants: Secondary | ICD-10-CM | POA: Diagnosis not present

## 2013-06-09 DIAGNOSIS — R0989 Other specified symptoms and signs involving the circulatory and respiratory systems: Secondary | ICD-10-CM | POA: Diagnosis not present

## 2013-06-09 DIAGNOSIS — R0609 Other forms of dyspnea: Secondary | ICD-10-CM | POA: Diagnosis not present

## 2013-06-09 DIAGNOSIS — Z01810 Encounter for preprocedural cardiovascular examination: Secondary | ICD-10-CM | POA: Diagnosis not present

## 2013-06-09 DIAGNOSIS — I4891 Unspecified atrial fibrillation: Secondary | ICD-10-CM | POA: Diagnosis not present

## 2013-06-09 DIAGNOSIS — I2510 Atherosclerotic heart disease of native coronary artery without angina pectoris: Secondary | ICD-10-CM | POA: Diagnosis not present

## 2013-06-09 DIAGNOSIS — I10 Essential (primary) hypertension: Secondary | ICD-10-CM | POA: Diagnosis not present

## 2013-06-09 DIAGNOSIS — E785 Hyperlipidemia, unspecified: Secondary | ICD-10-CM | POA: Diagnosis not present

## 2013-06-09 DIAGNOSIS — I119 Hypertensive heart disease without heart failure: Secondary | ICD-10-CM | POA: Diagnosis not present

## 2013-06-09 DIAGNOSIS — N183 Chronic kidney disease, stage 3 unspecified: Secondary | ICD-10-CM | POA: Diagnosis not present

## 2013-06-14 DIAGNOSIS — R972 Elevated prostate specific antigen [PSA]: Secondary | ICD-10-CM | POA: Diagnosis not present

## 2013-06-14 DIAGNOSIS — R3129 Other microscopic hematuria: Secondary | ICD-10-CM | POA: Diagnosis not present

## 2013-06-14 DIAGNOSIS — N402 Nodular prostate without lower urinary tract symptoms: Secondary | ICD-10-CM | POA: Diagnosis not present

## 2013-06-14 DIAGNOSIS — N362 Urethral caruncle: Secondary | ICD-10-CM | POA: Diagnosis not present

## 2013-06-15 ENCOUNTER — Encounter (INDEPENDENT_AMBULATORY_CARE_PROVIDER_SITE_OTHER): Payer: Self-pay

## 2013-06-15 ENCOUNTER — Encounter (HOSPITAL_COMMUNITY)
Admission: RE | Admit: 2013-06-15 | Discharge: 2013-06-15 | Disposition: A | Discharge status: Home / Self Care | Payer: Medicare Other | Source: Ambulatory Visit | Attending: Urology | Admitting: Urology

## 2013-06-15 ENCOUNTER — Encounter (HOSPITAL_COMMUNITY): Payer: Self-pay

## 2013-06-15 DIAGNOSIS — Z01812 Encounter for preprocedural laboratory examination: Secondary | ICD-10-CM | POA: Diagnosis not present

## 2013-06-15 HISTORY — DX: Personal history of other malignant neoplasm of skin: Z85.828

## 2013-06-15 LAB — PROTIME-INR
INR: 2.62 — AB (ref 0.00–1.49)
Prothrombin Time: 27.1 seconds — ABNORMAL HIGH (ref 11.6–15.2)

## 2013-06-15 LAB — BASIC METABOLIC PANEL
BUN: 48 mg/dL — AB (ref 6–23)
CALCIUM: 9.7 mg/dL (ref 8.4–10.5)
CHLORIDE: 103 meq/L (ref 96–112)
CO2: 27 mEq/L (ref 19–32)
Creatinine, Ser: 3.19 mg/dL — ABNORMAL HIGH (ref 0.50–1.35)
GFR calc Af Amer: 19 mL/min — ABNORMAL LOW (ref >90)
GFR calc non Af Amer: 16 mL/min — ABNORMAL LOW (ref >90)
Glucose, Bld: 80 mg/dL (ref 70–99)
Potassium: 4 mEq/L (ref 3.7–5.3)
Sodium: 144 mEq/L (ref 137–147)

## 2013-06-15 LAB — CBC
HCT: 39.9 % (ref 39.0–52.0)
Hemoglobin: 12.7 g/dL — ABNORMAL LOW (ref 13.0–17.0)
MCH: 28.9 pg (ref 26.0–34.0)
MCHC: 31.8 g/dL (ref 30.0–36.0)
MCV: 90.9 fL (ref 78.0–100.0)
Platelets: 344 10*3/uL (ref 150–400)
RBC: 4.39 MIL/uL (ref 4.22–5.81)
RDW: 15.9 % — AB (ref 11.5–15.5)
WBC: 9.3 10*3/uL (ref 4.0–10.5)

## 2013-06-15 LAB — APTT: APTT: 45 s — AB (ref 24–37)

## 2013-06-15 NOTE — Progress Notes (Signed)
Clearance from Dt. Wynonia Lawman on chart

## 2013-06-15 NOTE — Patient Instructions (Signed)
Christopher [name]  06/15/2013                           YOUR PROCEDURE IS SCHEDULED ON: 06/21/13               PLEASE REPORT TO SHORT STAY CENTER AT : 10:45                CALL THIS NUMBER IF ANY PROBLEMS THE DAY OF SURGERY :               [phone]                                REMEMBER:   Do not eat food or drink liquids AFTER MIDNIGHT   May have clear liquids UNTIL 6 HOURS BEFORE SURGERY (7:15 am)               Take these medicines the morning of surgery with A SIP OF WATER:  AMIODARONE / AMLODIPINE / ATENOLOL / FINESTERIDE   Do not wear jewelry, make-up   Do not wear lotions, powders, or perfumes.   Do not shave legs or underarms 12 hrs. before surgery (men may shave face)  Do not bring valuables to the hospital.  Contacts, dentures or bridgework may not be worn into surgery.  Leave suitcase in the car. After surgery it may be brought to your room.  For patients admitted to the hospital more than one night, checkout time is            11:00 AM                                                       The day of discharge.   Patients discharged the day of surgery will not be allowed to drive home.            If going home same day of surgery, must have someone stay with you              FIRST 24 hrs at home and arrange for some one to drive you              home from hospital.    Special Instructions             Please read over the following fact sheets that you were given:               1. [name]               2. USE FLEET ENEMA 2 [name]                                                X_____________________________________________________________________        Failure to follow these instructions may result in cancellation of your surgery

## 2013-06-15 NOTE — Progress Notes (Signed)
06/15/13 0927  OBSTRUCTIVE SLEEP APNEA  Have you ever been diagnosed with sleep apnea through a sleep study? No  Do you snore loudly (loud enough to be heard through closed doors)?  1  Do you often feel tired, fatigued, or sleepy during the daytime? 0  Has anyone observed you stop breathing during your sleep? 0  Do you have, or are you being treated for high blood pressure? 1  BMI more than 35 kg/m2? 0  Age over 78 years old? 1  Neck circumference greater than 40 cm/18 inches? 0  Gender: 1  Obstructive Sleep Apnea Score 4  Score 4 or greater  Results sent to PCP

## 2013-06-16 ENCOUNTER — Other Ambulatory Visit: Payer: Self-pay | Admitting: Urology

## 2013-06-16 DIAGNOSIS — L570 Actinic keratosis: Secondary | ICD-10-CM | POA: Diagnosis not present

## 2013-06-16 DIAGNOSIS — Z85828 Personal history of other malignant neoplasm of skin: Secondary | ICD-10-CM | POA: Diagnosis not present

## 2013-06-18 NOTE — H&P (Signed)
Urology History and Physical Exam  CC: Hematuria. Urethral lesion. Bladder lesion. Elevated PSA. Prostate nodule.  HPI:  80 year old male presents today for elevated PSA, prostate nodule, and hematuria with bladder lesion.  He is found to have an elevated PSA which is chronic.  It is rising.  It was 6.4 in December 2014.  This is associated with a prostate nodule.  It is approximately 1.5 cm in size.  It is located at the left apex of the prostate.  It is not tender to palpation.  He was worked up for microscopic hematuria and found to have a urethral polyp.  This is less than 1 cm in size.  It was at the distal aspect of the verumontanum or proximal to the external urinary sphincter.  Nothing makes it better or worse.  He also had a lesion in his bladder.  This was flat and erythematous.  It was in the right trigone just anterior to the right ureteral orifice.  He has a history of chronic renal insufficiency which has prevented him from having contrasted studies.  His preoperative antibiotic dosing was discussed with pharmacy.  We have discussed management options and he is elected to present today for cystoscopy, transurethral resection of urethral polyp, bladder biopsy, bilateral retrograde pyelograms, transrectal ultrasound guided prostate biopsy with bilateral prostate nerve block.  UA 06/14/13 was negative for signs of infection.  He was cleared by his cardiologist, Dr. Wynonia Lawman, doubled his warfarin 4 days prior to the procedure.  He was instructed to start Bactrim single strength once daily on Saturday and then again on Sunday.  Today, he will receive IV antibiotics which will be gentamicin per pharmacy dosing.  He will be instructed to restart his active single strength once daily again tomorrow.  He understands that he may need a catheter following this procedure.  PMH: Past Medical History  Diagnosis Date  . Lumbar disc disease   . BPH (benign prostatic hyperplasia)   . HTN (hypertension)    . Diabetes mellitus type II   . Sinus node dysfunction     symptomatic bradycardia  . Pacemaker     Medtronic-dual-chamber-DOI 2012  . CAD (coronary artery disease)     followed by Dr. Wynonia Lawman  . Arthritis   . History of skin cancer   . Frequency of urination   . Nocturia   . Chronic kidney disease     kidneys function at "20 %" followed by Dr. Mercy Moore  . Cancer     history of skin cancer  . Dysrhythmia     a fib    PSH: Past Surgical History  Procedure Laterality Date  . Coronary artery bypass graft  2003    x3  . Cardiac catheterization    . Lumbar laminectomy  11/01  . Appendectomy    . Cardioversion  08/08/2011    Procedure: CARDIOVERSION;  Surgeon: Jacolyn Reedy, MD;  Location: [place];  Service: Cardiovascular;  Laterality: N/A;  . Cardioversion N/A 07/16/2012    Procedure: CARDIOVERSION;  Surgeon: Jacolyn Reedy, MD;  Location: [place];  Service: Cardiovascular;  Laterality: N/A;  . Pacemaker insertion  2012  . Cataracts removed      Allergies: Allergies  Allergen Reactions  . Crestor [Rosuvastatin]     Aches in joint   . Lipitor [Atorvastatin]     Joint aching  . Vicodin [Hydrocodone-Acetaminophen]     "Made me nervous"  . Penicillins Rash    Medications: No prescriptions prior to admission  Social History: History   Social History  . Marital Status: Married    Spouse Name: N/A    Number of Children: 3  . Years of Education: N/A   Occupational History  . retired    Social History Main Topics  . Smoking status: Former Smoker    Types: Cigarettes    Quit date: 03/12/1968  . Smokeless tobacco: Not on file  . Alcohol Use: No  . Drug Use: No  . Sexual Activity: Not on file   Other Topics Concern  . Not on file   Social History Narrative  . No narrative on file    Family History: Family History  Problem Relation Age of Onset  . Cancer Mother   . Cancer Father     Review of Systems: Positive: None Negative: Fever,  SOB, or chest pain.  A further 10 point review of systems was negative except what is listed in the HPI.  Physical Exam: Filed Vitals:   06/21/13 1111  BP:   Pulse:   Temp: 97.7 F (36.5 C)  Resp:     General: No acute distress.  Awake. Head:  Normocephalic.  Atraumatic. ENT:  EOMI.  Mucous membranes moist Neck:  Supple.  No lymphadenopathy. CV:  S1 present. S2 present. Regular rate. Pulmonary: Equal effort bilaterally.  Clear to auscultation bilaterally. Abdomen: Soft.  Non- tender to palpation. Skin:  Normal turgor.  No visible rash. Extremity: No gross deformity of bilateral upper extremities.  No gross deformity of    bilateral lower extremities. Neurologic: Alert. Appropriate mood.    Studies:  No results found for this basename: HGB, WBC, PLT,  in the last 72 hours  No results found for this basename: NA, K, CL, CO2, BUN, CREATININE, CALCIUM, MAGNESIUM, GFRNONAA, GFRAA,  in the last 72 hours   No results found for this basename: PT, INR, APTT,  in the last 72 hours   No components found with this basename: ABG,     Assessment:  Hematuria. Urethral lesion. Bladder lesion. Elevated PSA. Prostate nodule.  Plan: To OR  for cystoscopy, transurethral resection of urethral polyp, bladder biopsy, bilateral retrograde pyelograms, transrectal ultrasound guided prostate biopsy with bilateral prostate nerve block.  Pre-op INR & PT have normalized. Renal function slightly worse; I will provide his family with the information to pass on to his nephrologist.

## 2013-06-20 MED ORDER — GENTAMICIN SULFATE 40 MG/ML IJ SOLN
160.0000 mg | INTRAMUSCULAR | Status: AC
  Administered 2013-06-21: 160 mg via INTRAVENOUS
  Filled 2013-06-20: qty 4

## 2013-06-21 ENCOUNTER — Encounter (HOSPITAL_COMMUNITY): Payer: Self-pay | Admitting: *Deleted

## 2013-06-21 ENCOUNTER — Encounter (HOSPITAL_COMMUNITY): Payer: Medicare Other | Admitting: Registered Nurse

## 2013-06-21 ENCOUNTER — Ambulatory Visit (HOSPITAL_COMMUNITY)
Admission: RE | Admit: 2013-06-21 | Discharge: 2013-06-21 | Disposition: A | Discharge status: Home / Self Care | Payer: Medicare Other | Source: Ambulatory Visit | Attending: Urology | Admitting: Urology

## 2013-06-21 ENCOUNTER — Encounter (HOSPITAL_COMMUNITY)
Admission: RE | Disposition: A | Discharge status: Home / Self Care | Payer: Self-pay | Source: Ambulatory Visit | Attending: Urology

## 2013-06-21 ENCOUNTER — Ambulatory Visit (HOSPITAL_COMMUNITY): Payer: Medicare Other | Admitting: Registered Nurse

## 2013-06-21 DIAGNOSIS — R3129 Other microscopic hematuria: Secondary | ICD-10-CM | POA: Diagnosis not present

## 2013-06-21 DIAGNOSIS — C7919 Secondary malignant neoplasm of other urinary organs: Secondary | ICD-10-CM | POA: Insufficient documentation

## 2013-06-21 DIAGNOSIS — Z85828 Personal history of other malignant neoplasm of skin: Secondary | ICD-10-CM | POA: Insufficient documentation

## 2013-06-21 DIAGNOSIS — Z950 Presence of cardiac pacemaker: Secondary | ICD-10-CM | POA: Insufficient documentation

## 2013-06-21 DIAGNOSIS — R972 Elevated prostate specific antigen [PSA]: Secondary | ICD-10-CM | POA: Insufficient documentation

## 2013-06-21 DIAGNOSIS — N362 Urethral caruncle: Secondary | ICD-10-CM | POA: Diagnosis not present

## 2013-06-21 DIAGNOSIS — N402 Nodular prostate without lower urinary tract symptoms: Secondary | ICD-10-CM | POA: Insufficient documentation

## 2013-06-21 DIAGNOSIS — C61 Malignant neoplasm of prostate: Secondary | ICD-10-CM | POA: Insufficient documentation

## 2013-06-21 DIAGNOSIS — N189 Chronic kidney disease, unspecified: Secondary | ICD-10-CM | POA: Diagnosis not present

## 2013-06-21 DIAGNOSIS — I2510 Atherosclerotic heart disease of native coronary artery without angina pectoris: Secondary | ICD-10-CM | POA: Insufficient documentation

## 2013-06-21 DIAGNOSIS — Z9089 Acquired absence of other organs: Secondary | ICD-10-CM | POA: Diagnosis not present

## 2013-06-21 DIAGNOSIS — E119 Type 2 diabetes mellitus without complications: Secondary | ICD-10-CM | POA: Diagnosis not present

## 2013-06-21 DIAGNOSIS — Z951 Presence of aortocoronary bypass graft: Secondary | ICD-10-CM | POA: Insufficient documentation

## 2013-06-21 DIAGNOSIS — Z87891 Personal history of nicotine dependence: Secondary | ICD-10-CM | POA: Diagnosis not present

## 2013-06-21 DIAGNOSIS — I129 Hypertensive chronic kidney disease with stage 1 through stage 4 chronic kidney disease, or unspecified chronic kidney disease: Secondary | ICD-10-CM | POA: Diagnosis not present

## 2013-06-21 DIAGNOSIS — R310 Gross hematuria: Secondary | ICD-10-CM | POA: Diagnosis not present

## 2013-06-21 DIAGNOSIS — N329 Bladder disorder, unspecified: Secondary | ICD-10-CM | POA: Diagnosis not present

## 2013-06-21 DIAGNOSIS — R319 Hematuria, unspecified: Secondary | ICD-10-CM

## 2013-06-21 DIAGNOSIS — N369 Urethral disorder, unspecified: Secondary | ICD-10-CM

## 2013-06-21 HISTORY — PX: PROSTATE BIOPSY: SHX241

## 2013-06-21 HISTORY — PX: CYSTOSCOPY WITH BIOPSY: SHX5122

## 2013-06-21 LAB — BASIC METABOLIC PANEL
BUN: 55 mg/dL — AB (ref 6–23)
CO2: 26 meq/L (ref 19–32)
Calcium: 9.4 mg/dL (ref 8.4–10.5)
Chloride: 103 mEq/L (ref 96–112)
Creatinine, Ser: 3.42 mg/dL — ABNORMAL HIGH (ref 0.50–1.35)
GFR calc Af Amer: 18 mL/min — ABNORMAL LOW (ref >90)
GFR calc non Af Amer: 15 mL/min — ABNORMAL LOW (ref >90)
Glucose, Bld: 116 mg/dL — ABNORMAL HIGH (ref 70–99)
POTASSIUM: 4.6 meq/L (ref 3.7–5.3)
Sodium: 143 mEq/L (ref 137–147)

## 2013-06-21 LAB — GLUCOSE, CAPILLARY
GLUCOSE-CAPILLARY: 123 mg/dL — AB (ref 70–99)
Glucose-Capillary: 106 mg/dL — ABNORMAL HIGH (ref 70–99)

## 2013-06-21 LAB — PROTIME-INR
INR: 1.59 — AB (ref 0.00–1.49)
Prothrombin Time: 18.5 seconds — ABNORMAL HIGH (ref 11.6–15.2)

## 2013-06-21 LAB — APTT: APTT: 38 s — AB (ref 24–37)

## 2013-06-21 SURGERY — CYSTOSCOPY, WITH BIOPSY
Anesthesia: General

## 2013-06-21 MED ORDER — LIDOCAINE HCL 2 % IJ SOLN
INTRAMUSCULAR | Status: DC | PRN
  Administered 2013-06-21: 10 mL

## 2013-06-21 MED ORDER — PHENYLEPHRINE 40 MCG/ML (10ML) SYRINGE FOR IV PUSH (FOR BLOOD PRESSURE SUPPORT)
PREFILLED_SYRINGE | INTRAVENOUS | Status: AC
  Filled 2013-06-21: qty 10

## 2013-06-21 MED ORDER — ONDANSETRON HCL 4 MG/2ML IJ SOLN
INTRAMUSCULAR | Status: AC
  Filled 2013-06-21: qty 2

## 2013-06-21 MED ORDER — ONDANSETRON HCL 4 MG/2ML IJ SOLN
INTRAMUSCULAR | Status: DC | PRN
  Administered 2013-06-21: 4 mg via INTRAVENOUS

## 2013-06-21 MED ORDER — FLEET ENEMA 7-19 GM/118ML RE ENEM: 1.0000 | ENEMA | Freq: Once | RECTAL | Status: DC

## 2013-06-21 MED ORDER — BUPIVACAINE HCL (PF) 0.25 % IJ SOLN
INTRAMUSCULAR | Status: DC | PRN
  Administered 2013-06-21: 10 mL

## 2013-06-21 MED ORDER — PHENYLEPHRINE HCL 10 MG/ML IJ SOLN
20.0000 mg | INTRAVENOUS | Status: DC | PRN
  Administered 2013-06-21: 15 ug/min via INTRAVENOUS

## 2013-06-21 MED ORDER — PROPOFOL 10 MG/ML IV BOLUS
INTRAVENOUS | Status: DC | PRN
  Administered 2013-06-21: 150 mg via INTRAVENOUS

## 2013-06-21 MED ORDER — PROPOFOL 10 MG/ML IV BOLUS
INTRAVENOUS | Status: AC
  Filled 2013-06-21: qty 20

## 2013-06-21 MED ORDER — PHENYLEPHRINE HCL 10 MG/ML IJ SOLN
INTRAMUSCULAR | Status: DC | PRN
  Administered 2013-06-21 (×2): 80 ug via INTRAVENOUS

## 2013-06-21 MED ORDER — FENTANYL CITRATE 0.05 MG/ML IJ SOLN: 25.0000 ug | INTRAMUSCULAR | Status: DC | PRN

## 2013-06-21 MED ORDER — LACTATED RINGERS IV SOLN
INTRAVENOUS | Status: DC | PRN
Start: 2013-06-21 — End: 2013-06-21

## 2013-06-21 MED ORDER — FENTANYL CITRATE 0.05 MG/ML IJ SOLN
INTRAMUSCULAR | Status: AC
  Filled 2013-06-21: qty 2

## 2013-06-21 MED ORDER — SENNOSIDES-DOCUSATE SODIUM 8.6-50 MG PO TABS: 1.0000 | ORAL_TABLET | Freq: Two times a day (BID) | ORAL | Status: DC

## 2013-06-21 MED ORDER — BELLADONNA ALKALOIDS-OPIUM 16.2-60 MG RE SUPP
RECTAL | Status: DC | PRN
  Administered 2013-06-21: 1 via RECTAL

## 2013-06-21 MED ORDER — SODIUM CHLORIDE 0.9 % IR SOLN
Status: DC | PRN
  Administered 2013-06-21: 3000 mL

## 2013-06-21 MED ORDER — LIDOCAINE HCL (CARDIAC) 20 MG/ML IV SOLN
INTRAVENOUS | Status: AC
  Filled 2013-06-21: qty 5

## 2013-06-21 MED ORDER — TRAMADOL HCL 50 MG PO TABS: 50.0000 mg | ORAL_TABLET | Freq: Four times a day (QID) | ORAL | Status: DC | PRN

## 2013-06-21 MED ORDER — FENTANYL CITRATE 0.05 MG/ML IJ SOLN
INTRAMUSCULAR | Status: DC | PRN
  Administered 2013-06-21: 50 ug via INTRAVENOUS

## 2013-06-21 MED ORDER — BUPIVACAINE HCL (PF) 0.25 % IJ SOLN
INTRAMUSCULAR | Status: AC
  Filled 2013-06-21: qty 30

## 2013-06-21 MED ORDER — EPHEDRINE SULFATE 50 MG/ML IJ SOLN
INTRAMUSCULAR | Status: DC | PRN
  Administered 2013-06-21: 10 mg via INTRAVENOUS

## 2013-06-21 MED ORDER — HYOSCYAMINE SULFATE 0.125 MG PO TABS: 0.1250 mg | ORAL_TABLET | ORAL | Status: DC | PRN

## 2013-06-21 MED ORDER — STERILE WATER FOR IRRIGATION IR SOLN
Status: DC | PRN
  Administered 2013-06-21: 3000 mL

## 2013-06-21 MED ORDER — SUCCINYLCHOLINE CHLORIDE 20 MG/ML IJ SOLN
INTRAMUSCULAR | Status: DC | PRN
  Administered 2013-06-21: 100 mg via INTRAVENOUS

## 2013-06-21 MED ORDER — PHENYLEPHRINE HCL 10 MG/ML IJ SOLN
INTRAMUSCULAR | Status: AC
  Filled 2013-06-21: qty 2

## 2013-06-21 MED ORDER — LIDOCAINE HCL (CARDIAC) 20 MG/ML IV SOLN
INTRAVENOUS | Status: DC | PRN
  Administered 2013-06-21: 80 mg via INTRAVENOUS

## 2013-06-21 MED ORDER — SODIUM CHLORIDE 0.9 % IV SOLN
INTRAVENOUS | Status: DC | PRN
  Administered 2013-06-21: 13:00:00 via INTRAVENOUS

## 2013-06-21 MED ORDER — LIDOCAINE HCL 2 % EX GEL
CUTANEOUS | Status: AC
  Filled 2013-06-21: qty 10

## 2013-06-21 MED ORDER — LIDOCAINE HCL 1 % IJ SOLN
INTRAMUSCULAR | Status: AC
  Filled 2013-06-21: qty 20

## 2013-06-21 MED ORDER — BELLADONNA ALKALOIDS-OPIUM 16.2-60 MG RE SUPP
RECTAL | Status: AC
  Filled 2013-06-21: qty 1

## 2013-06-21 SURGICAL SUPPLY — 10 items
BAG URINE DRAINAGE (UROLOGICAL SUPPLIES) ×3 IMPLANT
BAG URO CATCHER STRL LF (DRAPE) ×3 IMPLANT
CATH FOLEY 2WAY SLVR 18FR 30CC (CATHETERS) ×3 IMPLANT
CATH URET 5FR 28IN OPEN ENDED (CATHETERS) ×3 IMPLANT
GLOVE BIO SURGEON STRL SZ7.5 (GLOVE) ×3 IMPLANT
GLOVE BIOGEL M 7.0 STRL (GLOVE) ×12 IMPLANT
GOWN STRL REIN XL XLG (GOWN DISPOSABLE) ×9 IMPLANT
PACK CYSTO (CUSTOM PROCEDURE TRAY) ×3 IMPLANT
SYR CONTROL 10ML LL (SYRINGE) IMPLANT
UNDERPAD 30X30 INCONTINENT (UNDERPADS AND DIAPERS) ×3 IMPLANT

## 2013-06-21 NOTE — Anesthesia Preprocedure Evaluation (Addendum)
Anesthesia Evaluation  Patient identified by MRN, date of birth, ID band Patient awake    Reviewed: Allergy & Precautions, H&P , NPO status , Patient's Chart, lab work & pertinent test results  Airway Mallampati: II TM Distance: >3 FB Neck ROM: Full    Dental no notable dental hx.    Pulmonary neg pulmonary ROS, former smoker,  breath sounds clear to auscultation  Pulmonary exam normal       Cardiovascular hypertension, Pt. on medications and Pt. on home beta blockers + CAD + dysrhythmias Atrial Fibrillation + pacemaker Rhythm:Regular Rate:Normal     Neuro/Psych negative neurological ROS  negative psych ROS   GI/Hepatic negative GI ROS, Neg liver ROS,   Endo/Other  diabetes, Type 2, Oral Hypoglycemic Agents  Renal/GU CRF and Renal InsufficiencyRenal disease  negative genitourinary   Musculoskeletal negative musculoskeletal ROS (+)   Abdominal   Peds negative pediatric ROS (+)  Hematology negative hematology ROS (+)   Anesthesia Other Findings   Reproductive/Obstetrics negative OB ROS                          Anesthesia Physical Anesthesia Plan  ASA: III  Anesthesia Plan: General   Post-op Pain Management:    Induction: Intravenous  Airway Management Planned: LMA  Additional Equipment:   Intra-op Plan:   Post-operative Plan: Extubation in OR  Informed Consent: I have reviewed the patients History and Physical, chart, labs and discussed the procedure including the risks, benefits and alternatives for the proposed anesthesia with the patient or authorized representative who has indicated his/her understanding and acceptance.   Dental advisory given  Plan Discussed with: CRNA  Anesthesia Plan Comments:         Anesthesia Quick Evaluation

## 2013-06-21 NOTE — Op Note (Signed)
Urology Operative Report  Date of Procedure: 06/21/13  Surgeon: Rolan Bucco, MD Assistant:  None  Preoperative Diagnosis: Elevated PSA, prostate nodule, urethral tumor, bladder lesion, hematuria. Postoperative Diagnosis:  Elevated PSA, prostate nodule, urethral tumor, bladder lesion, hematuria, and urethral stenosis.  Procedure(s): Transrectal US guided prostate biopsy with bilateral prostatic nerve block. Urethral biopsy. Urethral dilation. Cystoscopy with bladder biopsy. Bilateral retrograde pyelograms with interpretation. Foley catheter placement.   Estimated blood loss: Minimal  Specimen: Prostate biopsy, prostatic urethral biopsy, bladder biopsy.  Drains: 18Fr foley catheter.  Complications: None  Findings: Prostatic urethral biopsy, bladder erythema, prostate nodule. Negative hydronephrosis or filling defects on bilateral retrograde pyelograms.  Prostate measurement: Volume: 34.47 cc.  Length: 4.29 cm. Height: 3.04 cm. Width: 5.04 cm.  History of present illness: 80 yo M with a history of elevated PSA, prostate nodule, urethral tumor, bladder lesion, hematuria presents to the operating room for prostate biopsy, urethral biopsy, bladder biopsy, and bilateral retrograde pyelograms. He was cleared by his cardiologist, Dr. Wynonia Lawman, to hold his warfarin 4 days prior. Preoperative check of INR and PTT were normalized. He took preoperative antibiotics as discussed.   Procedure in detail:   After informed consent was obtained, the patient was taken  to the operating room where he was placed in the supine position.  General anesthesia care was induced and he was placed on his side.  SCDs were in place and turned on before induction of the monitored  anesthesia care. IV antibiotics were infused. A time-out was then  performed in which the correct patient, surgical site, and procedure  were identified and agreed upon by the team.   I then performed a digital rectal  examination and estimated his prostate to be 35 g in size. There was a nodule on the left apex.  Next, the rectal ultrasound probe was advanced through the anus into the rectum. The  prostate was well visualized. I scanned the entire prostate and  performed measurements (results listed above).  Next, I performed a prostatic nerve block bilaterally by visualizing the base of the prostate with the junction of the seminal  vesicle on the right side and injecting 4 mL of 0.25% Marcaine without  epinephrine. Next, I advanced the probe to the apex of the right side  and injected 1 mL. Attention was turned to the left side at the junction of the base of the prostate and the seminal vesicle, and 4 mL were  injected here as well as 1 mL at the left apex giving a total of 10 mL  of injection. Each time before injecting, it did pull back on the  syringe to ensure I was not injecting into the vasculature.   There were no calcifications. Hypoechoic left apex nodule. No hypoechoic areas.  Normal seminal vesicles.  Median lobe present: Negative   Following this, the standard sextant biopsies were carried out by obtaining six  biopsies on each side at the lateral apex, midportion and base as well  as the medial apex, midportion, and base, bilaterally.  The left lateral apex biopsy also included the nodule.  After this was done, there was noted to be no rectal bleeding or any other  complications. The probe was removed.   The patient was then placed in a dorsolithotomy position on the operating room table making sure to pad all pertinent neurovascular pressure points. The genitals were then prepped and draped in usual sterile fashion. A second timeout was performed for the cystoscopy portion of this procedure.  A rigid cystoscope was advanced to be advanced through the urethra, but this was not successful due to to meatal stenosis. Leander Rams sounds were used with copious lubrication to dilate from 16  [place] up to 26 [place]. After this I was able to navigate the cystoscope through the urethra and encountered an area of narrowing in the bulbar urethra. I took a picture of this. I was able to pass through this with the cystoscope with ease. The prostatic urethra noted the prostatic urethral nodule which was noted on preoperative cystoscopy. I then navigated into the bladder and drained the bladder. I then fully distended the bladder and evaluated with a 30 and 70 lens in systematic fashion to visualize the entire surface the bladder. There was noted to be evacuation of the bladder. There was erythema anterior to the right ureter orifice.  Cold cup biopsy forceps were used to biopsy the area anterior to the right ureter orifice. Bugbee electrode was used in normal saline to fulgurate the biopsy site and there was good hemostasis.  Attention was turned to the prostatic urethral nodule. Cold cup biopsy forceps were used to remove this nodule and several pieces. All the biopsies were able to be maintained proximal to the external sphincter. Bugbee electrode was then used in sterile water to fulgurate the biopsy site. This was used gently to try to decrease her risk of future stricture. There was good hemostasis.  Cystoscope was then advanced and bilateral retrograde pyelograms were obtained. I inserted a 5 [place] ureter catheter into the right ureter orifice and injected 7 cc of Omnipaque. This was negative for filling defects or hydronephrosis. The right side drained well. I cannulated the left ureter orifice with a 5 French ureter catheter and injected 9 cc of Omnipaque. This was negative for filling defects or hydronephrosis. The left side drained well.  The cystoscope was removed, 10 cc of lidocaine jelly were placed into the urethra, and I placed an 18 French Foley catheter with ease. I inflated the balloon with 10 cc of water. I then placed a belladonna and opium suppository to rectum.  The patient  was placed back in supine position, anesthesia was reversed, and he was taken to the PACU in stable condition.  All counts were correct at the end of the case.  He will continue the Bactrim as prescribed. He'll take one more day of this antibiotic. He will restart his warfarin tonight as recommended by his cardiologist. He'll be taught Foley catheter care by the nursing staff. Followup is planned with me. His family was provided with his most recent renal functions to discuss with his nephrologist.

## 2013-06-21 NOTE — Anesthesia Postprocedure Evaluation (Signed)
  Anesthesia Post-op Note  Patient: Christopher [name]  Procedure(s) Performed: Procedure(s) (LRB): CYSTOSCOPY WITH BIOPSY (N/A) BIOPSY TRANSRECTAL ULTRASONIC PROSTATE (TUBP) (N/A)  Patient Location: PACU  Anesthesia Type: General  Level of Consciousness: awake and alert   Airway and Oxygen Therapy: Patient Spontanous Breathing  Post-op Pain: mild  Post-op Assessment: Post-op Vital signs reviewed, Patient's Cardiovascular Status Stable, Respiratory Function Stable, Patent Airway and No signs of Nausea or vomiting  Last Vitals:  Filed Vitals:   06/21/13 1707  BP: 123/65  Pulse: 65  Temp: 36.7 C  Resp: 14    Post-op Vital Signs: stable   Complications: No apparent anesthesia complications

## 2013-06-21 NOTE — Transfer of Care (Signed)
Immediate Anesthesia Transfer of Care Note  Patient: Christopher [name]  Procedure(s) Performed: Procedure(s) with comments: CYSTOSCOPY WITH BIOPSY (N/A) -   TUR RESECTION OF POLYP BILATERAL RETROGRADE PYELOGRAM BLADDER BIOPSY   BIOPSY TRANSRECTAL ULTRASONIC PROSTATE (TUBP) (N/A) - PROSTATE NERVE BLOCK  Patient Location: PACU  Anesthesia Type:General  Level of Consciousness: awake, alert , oriented and patient cooperative  Airway & Oxygen Therapy: Patient Spontanous Breathing and Patient connected to face mask oxygen  Post-op Assessment: Report given to PACU RN, Post -op Vital signs reviewed and stable and Patient moving all extremities  Post vital signs: Reviewed and stable  Complications: No apparent anesthesia complications

## 2013-06-21 NOTE — Discharge Instructions (Signed)
DISCHARGE INSTRUCTIONS FOR TRANSURETHRAL SURGERY OF BLADDER  MEDICATIONS:   1. Resume your warfarin tonight.  2. Resume all your other meds from home.    ACTIVITY 1. No heavy lifting >10 pounds for 2 weeks 2. No sexual activity for 2 weeks 3. No strenuous activity for 2 weeks 4. No driving while on narcotic pain medications 5. Drink plenty of water 6. Continue to walk at home - you can still get blood clots when you are at  home, so keep active, but don't over do it. 7. Your urine may have some blood in it - make sure you drink plenty of water,  call or come to the ER immediately if your catheter stops draining  FOLEY CATHETER  (If you go home with a catheter in place.) 1. Make sure your catheter is attached to your leg at all times - do not let  anything pull on it 2. If the urine is your tube starts looking dark red or if it stops draining,  call us immediately or come to the ER 3. Drink plenty of water, if you do notice your urine looking darking, sit down,  relax and drink lots of water 4. You will be given a leg bag as well as an overnight bag for your catheter -  MAKE SURE ATTACHED TO YOUR LEG AT ALL TIMES WITH TAPE OR LEG STRAP  BATHING 1. You can shower.  You may take a bath unless you have a Foley catheter in place.  SIGNS/SYMPTOMS TO CALL: 1. Please call us if you have a fever greater than 101.5, uncontrolled  nausea/vomiting, uncontrolled pain, dizziness, unable to urinate, chest pain, shortness of breath, leg swelling, leg pain, or any other concerns  or questions.  You can reach [place] at [phone].    Foley Catheter Care, Adult A Foley catheter is a soft, flexible tube. This tube is placed into your bladder to drain pee (urine). If you go home with this catheter in place, follow the instructions below. TAKING CARE OF THE CATHETER 1. Wash your hands with soap and water. 2. Put soap and water on a clean washcloth.  Clean the skin where the tube goes into  your body.  Clean away from the tube site.  Never wipe toward the tube.  Clean the area using a circular motion.  Remove all the soap. Pat the area dry with a clean towel. For males, reposition the skin that covers the end of the penis (foreskin). 3. Attach the tube to your leg with tape or a leg strap. Do not stretch the tube tight. If you are using tape, remove any stickiness left behind by past tape you used. 4. Keep the drainage bag below your hips. Keep it off the floor. 5. Check your tube during the day. Make sure it is working and draining. Make sure the tube does not curl, twist, or bend. 6. Do not pull on the tube or try to take it out. TAKING CARE OF THE DRAINAGE BAGS You will have a large overnight drainage bag and a small leg bag. You may wear the overnight bag any time. Never wear the small bag at night. Follow the directions below. Emptying the Drainage Bag Empty your drainage bag when it is    full or at least 2 3 times a day. 1. Wash your hands with soap and water. 2. Keep the drainage bag below your hips. 3. Hold the dirty bag over the toilet or clean container. 4. Open the  pour spout at the bottom of the bag. Empty the pee into the toilet or container. Do not let the pour spout touch anything. 5. Clean the pour spout with a gauze pad or cotton ball that has rubbing alcohol on it. 6. Close the pour spout. 7. Attach the bag to your leg with tape or a leg strap. 8. Wash your hands well. Changing the Drainage Bag Change your bag once a month or sooner if it starts to smell or look dirty.  1. Wash your hands with soap and water. 2. Pinch the rubber tube so that pee does not spill out. 3. Disconnect the catheter tube from the drainage tube at the connection valve. Do not let the tubes touch anything. 4. Clean the end of the catheter tube with an alcohol wipe. Clean the end of a the drainage tube with a different alcohol wipe. 5. Connect the catheter tube to the drainage  tube of the clean drainage bag. 6. Attach the new bag to the leg with tape or a leg strap. Avoid attaching the new bag too tightly. 7. Wash your hands well. Cleaning the Drainage Bag 1. Wash your hands with soap and water. 2. Wash the bag in warm, soapy water. 3. Rinse the bag with warm water. 4. Fill the bag with a mixture of white vinegar and water (1 c vinegar to 1 qt warm water [.2 L vinegar to 1 L warm water]). Close the bag and soak it for 30 minutes in a solution. 5. Rinse the bag with warm water. 6. Hang the bag to dry with the pour spout open and hanging downward. 7. Store the clean bag (once it is dry) in a clean plastic bag. 8. Wash your hands well. PREVENT INFECTION  Wash your hands before and after touching your tube.  Take showers every day. Wash the skin where the tube enters your body. Do not take baths. Replace wet leg straps with dry ones, if this applies.  Do not use powders, sprays, or lotions on the genital area. Only use creams, lotions, or ointments as told by your doctor.  For females, wipe from front to back after going to the bathroom.  Drink enough fluids to keep your pee clear or pale yellow unless you are told not to have too much fluid (fluid restriction).  Do not let the drainage bag or tubing touch or lie on the floor.  Wear cotton underwear to keep the area dry. GET HELP RIGHT AWAY IF:  You have pain, puffiness (swelling), redness, or yellowish-white fluid (pus) where the tube enters the body.  You have pain in the belly (abdomen), legs, lower back, or bladder.  You have a fever.  You see blood fill the tube, or your pee is pink or red.  You feel sick to your stomach (nauseous), throw up (vomit), or have chills.  Your tube gets pulled out.  Your pee is cloudy or smells unusually bad.  Your tube becomes clogged.  You are not draining pee into the bag or your bladder feels full.  Your tube starts to leak. MAKE SURE YOU:   Understand  these instructions.  Will watch your condition.  Will get help right away if you are not doing well or get worse. Document Released: 06/22/2012 Document Reviewed: 06/22/2012 [facility] Patient Information 2014 [place], [place].

## 2013-06-22 ENCOUNTER — Encounter (HOSPITAL_COMMUNITY): Payer: Self-pay | Admitting: Urology

## 2013-06-24 ENCOUNTER — Other Ambulatory Visit (HOSPITAL_COMMUNITY): Payer: Self-pay | Admitting: Urology

## 2013-06-24 DIAGNOSIS — C61 Malignant neoplasm of prostate: Secondary | ICD-10-CM

## 2013-06-25 DIAGNOSIS — I119 Hypertensive heart disease without heart failure: Secondary | ICD-10-CM | POA: Diagnosis not present

## 2013-06-25 DIAGNOSIS — I4891 Unspecified atrial fibrillation: Secondary | ICD-10-CM | POA: Diagnosis not present

## 2013-06-25 DIAGNOSIS — Z951 Presence of aortocoronary bypass graft: Secondary | ICD-10-CM | POA: Diagnosis not present

## 2013-06-25 DIAGNOSIS — N183 Chronic kidney disease, stage 3 unspecified: Secondary | ICD-10-CM | POA: Diagnosis not present

## 2013-06-25 DIAGNOSIS — Z01810 Encounter for preprocedural cardiovascular examination: Secondary | ICD-10-CM | POA: Diagnosis not present

## 2013-06-25 DIAGNOSIS — Z950 Presence of cardiac pacemaker: Secondary | ICD-10-CM | POA: Diagnosis not present

## 2013-06-25 DIAGNOSIS — Z7901 Long term (current) use of anticoagulants: Secondary | ICD-10-CM | POA: Diagnosis not present

## 2013-06-25 DIAGNOSIS — I2510 Atherosclerotic heart disease of native coronary artery without angina pectoris: Secondary | ICD-10-CM | POA: Diagnosis not present

## 2013-06-25 DIAGNOSIS — E785 Hyperlipidemia, unspecified: Secondary | ICD-10-CM | POA: Diagnosis not present

## 2013-06-28 DIAGNOSIS — C61 Malignant neoplasm of prostate: Secondary | ICD-10-CM | POA: Diagnosis not present

## 2013-06-30 ENCOUNTER — Ambulatory Visit (HOSPITAL_COMMUNITY)
Admission: RE | Admit: 2013-06-30 | Discharge: 2013-06-30 | Disposition: A | Discharge status: Home / Self Care | Payer: Medicare Other | Source: Ambulatory Visit | Attending: Urology | Admitting: Urology

## 2013-06-30 ENCOUNTER — Encounter (HOSPITAL_COMMUNITY)
Admission: RE | Admit: 2013-06-30 | Discharge: 2013-06-30 | Disposition: A | Discharge status: Home / Self Care | Payer: Medicare Other | Source: Ambulatory Visit | Attending: Urology | Admitting: Urology

## 2013-06-30 DIAGNOSIS — C61 Malignant neoplasm of prostate: Secondary | ICD-10-CM | POA: Diagnosis not present

## 2013-06-30 DIAGNOSIS — R935 Abnormal findings on diagnostic imaging of other abdominal regions, including retroperitoneum: Secondary | ICD-10-CM | POA: Diagnosis not present

## 2013-06-30 MED ORDER — TECHNETIUM TC 99M MEDRONATE IV KIT
27.5000 | PACK | Freq: Once | INTRAVENOUS | Status: AC | PRN
  Administered 2013-06-30: 27.5 via INTRAVENOUS

## 2013-07-01 ENCOUNTER — Other Ambulatory Visit (HOSPITAL_COMMUNITY): Payer: Self-pay | Admitting: Urology

## 2013-07-01 ENCOUNTER — Ambulatory Visit (HOSPITAL_COMMUNITY)
Admission: RE | Admit: 2013-07-01 | Discharge: 2013-07-01 | Disposition: A | Discharge status: Home / Self Care | Payer: Medicare Other | Source: Ambulatory Visit | Attending: Urology | Admitting: Urology

## 2013-07-01 DIAGNOSIS — M5030 Other cervical disc degeneration, unspecified cervical region: Secondary | ICD-10-CM | POA: Insufficient documentation

## 2013-07-01 DIAGNOSIS — I2510 Atherosclerotic heart disease of native coronary artery without angina pectoris: Secondary | ICD-10-CM | POA: Diagnosis not present

## 2013-07-01 DIAGNOSIS — C61 Malignant neoplasm of prostate: Secondary | ICD-10-CM

## 2013-07-01 DIAGNOSIS — I119 Hypertensive heart disease without heart failure: Secondary | ICD-10-CM | POA: Diagnosis not present

## 2013-07-01 DIAGNOSIS — I6529 Occlusion and stenosis of unspecified carotid artery: Secondary | ICD-10-CM | POA: Diagnosis not present

## 2013-07-01 DIAGNOSIS — I4891 Unspecified atrial fibrillation: Secondary | ICD-10-CM | POA: Diagnosis not present

## 2013-07-01 DIAGNOSIS — E785 Hyperlipidemia, unspecified: Secondary | ICD-10-CM | POA: Diagnosis not present

## 2013-07-01 DIAGNOSIS — N183 Chronic kidney disease, stage 3 unspecified: Secondary | ICD-10-CM | POA: Diagnosis not present

## 2013-07-01 DIAGNOSIS — I10 Essential (primary) hypertension: Secondary | ICD-10-CM | POA: Diagnosis not present

## 2013-07-01 DIAGNOSIS — M47812 Spondylosis without myelopathy or radiculopathy, cervical region: Secondary | ICD-10-CM | POA: Diagnosis not present

## 2013-07-01 DIAGNOSIS — R0609 Other forms of dyspnea: Secondary | ICD-10-CM | POA: Diagnosis not present

## 2013-07-01 DIAGNOSIS — Z7901 Long term (current) use of anticoagulants: Secondary | ICD-10-CM | POA: Diagnosis not present

## 2013-07-01 DIAGNOSIS — Z01810 Encounter for preprocedural cardiovascular examination: Secondary | ICD-10-CM | POA: Diagnosis not present

## 2013-07-01 DIAGNOSIS — R0989 Other specified symptoms and signs involving the circulatory and respiratory systems: Secondary | ICD-10-CM | POA: Diagnosis not present

## 2013-07-09 ENCOUNTER — Telehealth: Payer: Self-pay | Admitting: *Deleted

## 2013-07-09 NOTE — Telephone Encounter (Signed)
Called patient to introduce myself as Prostate Oncology Navigator and coordinator of the Prostate [place], to confirm his referral for the clinic on 07/16/13, location of [place], arrival time of 8:15 am, registration procedure, and format of clinic.  He verbalized understanding.  I provided my phone number and encouraged him to call me if he has any questions after receiving the Information Packet or prior to my call the day before clinic.  He verbalized understanding and expressed appreciation for my call.  Gayleen Orem, RN, BSN, [facility] Prostate Oncology Navigator [phone]

## 2013-07-13 ENCOUNTER — Encounter: Payer: Self-pay | Admitting: Radiation Oncology

## 2013-07-13 ENCOUNTER — Telehealth: Payer: Self-pay | Admitting: Oncology

## 2013-07-13 NOTE — Progress Notes (Signed)
GU Location of Tumor / Histology: prostatic adenocarcinoma  If Prostate Cancer, Gleason Score is (4 + 4) and PSA is (6.4)  Patient presented 03/09/2013 with signs/symptoms of: microhematuria and prostate nodule  Biopsies of prostate (if applicable) revealed:    Past/Anticipated interventions by urology, if any: resection of urethral poly, prostate biopsy, bladder biopsy, and retrograde process  Past/Anticipated interventions by medical oncology, if any: None  Weight changes, if any: None noted  Bowel/Bladder complaints, if any: nocturia and microhematuria   Nausea/Vomiting, if any: None noted  Pain issues, if any:  None noted  SAFETY ISSUES:  Prior radiation? NO  Pacemaker/ICD? YES  Possible current pregnancy? N/A  Is the patient on methotrexate? NO  Current Complaints / other details:  80 year old male. 34.47 prostate volume.

## 2013-07-13 NOTE — Telephone Encounter (Signed)
Chart made on 07/13/13 for appt. 07/16/13 °

## 2013-07-14 DIAGNOSIS — E1129 Type 2 diabetes mellitus with other diabetic kidney complication: Secondary | ICD-10-CM | POA: Diagnosis not present

## 2013-07-14 DIAGNOSIS — N184 Chronic kidney disease, stage 4 (severe): Secondary | ICD-10-CM | POA: Diagnosis not present

## 2013-07-14 DIAGNOSIS — I129 Hypertensive chronic kidney disease with stage 1 through stage 4 chronic kidney disease, or unspecified chronic kidney disease: Secondary | ICD-10-CM | POA: Diagnosis not present

## 2013-07-15 ENCOUNTER — Telehealth: Payer: Self-pay | Admitting: *Deleted

## 2013-07-15 NOTE — Telephone Encounter (Signed)
Called patient to see if he had any questions prior to his attendance at tomorrow's Prostate [place].  He stated he did not.  He confirmed he would bring the completed health information forms he received in the mailing I sent, confirmed his understanding of an 8:15 arrival time, confirmed his understanding of the [facility] location.  Gayleen Orem, RN, BSN, [facility] Prostate Oncology Navigator [phone]

## 2013-07-16 ENCOUNTER — Encounter: Payer: Self-pay | Admitting: Radiation Oncology

## 2013-07-16 ENCOUNTER — Encounter: Payer: Self-pay | Admitting: Specialist

## 2013-07-16 ENCOUNTER — Encounter: Payer: Self-pay | Admitting: Oncology

## 2013-07-16 ENCOUNTER — Ambulatory Visit
Admission: RE | Admit: 2013-07-16 | Discharge: 2013-07-16 | Disposition: A | Discharge status: Home / Self Care | Payer: Medicare Other | Source: Ambulatory Visit | Attending: Radiation Oncology | Admitting: Radiation Oncology

## 2013-07-16 ENCOUNTER — Ambulatory Visit (HOSPITAL_BASED_OUTPATIENT_CLINIC_OR_DEPARTMENT_OTHER): Payer: Medicare Other | Admitting: Oncology

## 2013-07-16 VITALS — BP 134/69 | HR 63 | Temp 98.0°F | Resp 16 | Ht 73.0 in | Wt 235.2 lb

## 2013-07-16 DIAGNOSIS — E119 Type 2 diabetes mellitus without complications: Secondary | ICD-10-CM | POA: Diagnosis not present

## 2013-07-16 DIAGNOSIS — I519 Heart disease, unspecified: Secondary | ICD-10-CM | POA: Diagnosis not present

## 2013-07-16 DIAGNOSIS — I10 Essential (primary) hypertension: Secondary | ICD-10-CM | POA: Diagnosis not present

## 2013-07-16 DIAGNOSIS — N189 Chronic kidney disease, unspecified: Secondary | ICD-10-CM

## 2013-07-16 DIAGNOSIS — Z87448 Personal history of other diseases of urinary system: Secondary | ICD-10-CM | POA: Diagnosis not present

## 2013-07-16 DIAGNOSIS — C61 Malignant neoplasm of prostate: Secondary | ICD-10-CM | POA: Diagnosis not present

## 2013-07-16 DIAGNOSIS — Z8679 Personal history of other diseases of the circulatory system: Secondary | ICD-10-CM | POA: Diagnosis not present

## 2013-07-16 NOTE — Consult Note (Signed)
Chief Complaint  Prostate Cancer   Reason For Visit  Reason for consult: To discuss treatment options for prostate cancer. Physician requesting consult: Dr. Rolan Bucco PCP: Dr. 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  He has a history of a left apical prostate nodule first noted to be about 8 mm in 2012.  Considering his advanced age and medical comorbidities as well as his relatively low PSA, this was monitored rather than biopsied.  He has baseline LUTS treated with finasteride but developed worsening voiding symptoms suggestive of obstruction and was noted to have a left apical prostate nodule that was increasing measuring over 1 cm and concerning for extraprostatic disease in 2015. Furthermore, his PSA was slowly rising and had increased to 6.41.  He also developed gross hematuria and underwent urologic evaluation including CT imaging which was unremarkable and cystoscopy which revealed what appeared to be a large prostatic urethral polyp.  On 06/28/13, he was taken to the OR for TUR of his prostatic urethral mass and transrectal prostate needle biopsy.  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  Staging with a CT scan on 4/22 was unremarkable for metastatic disease. A bone scan from the same day demonstrated questionable uptake at the C1-C2 level of the cervical spine. Plain films were less concerning although a skull base lesion could not be ruled out.  He is unable to have an MRI due to his pacemaker.  He is seen today in the multidisciplinary clinic with multiple family members including his wife and multiple children.  His current  urinary symptoms include urinary frequency, urgency, weak stream, and nocturia.  IPSS is 18.  He is sexually inactive.   Past Medical History  1. History of Cardiac Devices Pacemaker Present (V45.01)  2. Diabetes mellitus (250.00)  3. History of atrial fibrillation (V12.59)  4. History of chronic kidney disease (V13.09)  5. History of hypertension (V12.59)  6. History of Overactive bladder (596.51)  7. History of Skin Cancer (V10.83)  Surgical History  1. History of Appendectomy  2. History of Atrial Cardioversion  3. History of Back Surgery  4. History of Biopsy Of The Prostate Needle  5. History of CABG (CABG)  6. History of Cystoscopy With Biopsy  7. History of Dermatological Surgery  8. History of Pacemaker Placement  Current Meds  1. Amiodarone HCl - 200 MG Oral Tablet;  Therapy: 24Apr2014 to Recorded  2. AmLODIPine Besylate TABS;  Therapy: (Recorded:27Nov2007) to Recorded  3. Atenolol 25 MG Oral Tablet;  Therapy: 16Apr2013 to Recorded  4. Ciprofloxacin HCl - 500 MG Oral Tablet; TAKE 1 TABLET Once;  Therapy: 20Apr2015 to (Evaluate:21Apr2015); Last Rx:20Apr2015 Ordered  5. Finasteride 5 MG Oral Tablet; Take 1 tablet daily;  Therapy: [phone] to (WPYKDXIP:[id number])  Requested for: 27Jun2014; Last  QB:[id number] Ordered  6. Fish Oil CAPS;  Therapy: (Recorded:29May2009) to Recorded  7. Folic Acid TABS;  Therapy: (Recorded:27Nov2007) to Recorded  8. Furosemide 40 MG Oral Tablet;  Therapy: [id number] to Recorded  9. Glimepiride TABS;  Therapy: (Recorded:27Nov2007) to Recorded  10. Glucosamine Chondr 1500 Complx CAPS;   Therapy: (Recorded:06Apr2015) to Recorded  11. Horse Chestnut CAPS;   Therapy: (Recorded:27May2008) to Recorded  12.  Multi-Vitamin TABS;   Therapy: (Recorded:27Nov2007) to Recorded  13. Nitrostat 0.4 MG Sublingual Tablet Sublingual;   Therapy: 65HQI6962 to Recorded  14. Warfarin Sodium 5 MG Oral Tablet;   Therapy: 16Apr2013 to Recorded  15. Doxycycline  Monohydrate 100 MG Oral Tablet;   Therapy: 20Jan2015 to Recorded  16. Kionex 15 GM/60ML Oral Suspension;   Therapy: 95MWU1324 to Recorded  17. Levofloxacin 250 MG Oral Tablet;   Therapy: 22Jan2015 to Recorded  18. Promethazine HCl - 12.5 MG Oral Tablet;   Therapy: 40NUU7253 to Recorded  19. TraMADol HCl - 50 MG Oral Tablet;   Therapy: 13Apr2015 to Recorded  20. Triamterene-HCTZ 37.5-25 MG Oral Tablet;   Therapy: 66YQI3474 to Recorded  21. Warfarin Sodium 4 MG Oral Tablet;   Therapy: 02Apr2015 to Recorded  Allergies  1. Hydrocodone-Acetaminophen CAPS  2. Crestor TABS  3. Lipitor TABS  4. Penicillins  5. TraMADol HCl TABS  Family History  1. Family history of Cancer : Mother  2. Family history of Prostate Cancer (Q59.56) : Father  Social History   Former smoker [place])   Marital History - Currently Married   History of Tobacco Use (V15.82)  Review of Systems AU Complete-Male:  Constitutional: feeling tired (fatigue).  Hematologic/Lymphatic: a tendency to easily bruise.  Cardiovascular: leg swelling.  Respiratory: shortness of breath during exertion.  Musculoskeletal: back pain.    Physical Exam Constitutional: Well nourished and well developed . No acute distress.    Results/Data     I have independently reviewed his medical records, PSA results, pathology report, and imaging studies.  We have independently reviewed his pathology slides and imaging studies in conferen prior to his visit today.ce     Assessment  1. Prostate cancer (185)  2. History of chronic kidney disease (V13.09)  3. History of atrial fibrillation (V12.59)  4. History of CABG (CABG)  5. History of Pacemaker Placement  Discussion/Summary  1.  High-risk prostate cancer: [name] appears to have ductal carcinoma of the prostate after reviewing his pathology today.  He understands the natural history of his cancer is a fairly aggressive cancer with the potential for aggressive local growth as  well as the potential for development of distant metastases.  We discussed his imaging findings which are equivocal and he unfortunately cannot undergo further imaging with an MRI based on the fact he has a pacemaker.  I had a long and detailed discussion with the family today regarding treatment options which would appear to include primary androgen deprivation therapy possibly in a delayed fashion and possibly with intermittent treatment.We discussed the risks and benefits of androgen deprivation therapy. Side effects of prolonged use of androgen deprivation were discussed including hot flashes, fracture and depletion of bone mineral density, weight gain, decreased libido, cardiovascular risks such as increased risk of diabetes, abdominal girth, elevated cholesterol, and possibly cardiovascular events such as heart attack or stroke. We discussed intermittent androgen deprivation as an alternative approach to continuous therapy as well as combined androgen blockade.  It was explained that intermittent therapy has not been shown to improve survival but in certain populations of men has demonstrated similar survival rates with improved quality of life outcomes. It was also explained that combined androgen blockade may provide a marginal survival benefit as compare with sequential use of anti-androgens but with the risks of additional mild toxicities and increased cost.  We also discussed the option of primary radiation therapy to the prostate understanding  that this may provide curative therapy or delay progression  of his local disease.  He understands that this may come at a risk of exacerbating his already significant storage urinary symptoms.  He also understands that there is the possibility that he has metastatic disease based on his prior imaging.  We did discuss the option of considering a sodium fluoride PET scan in order to look for other areas of metastases before he would proceed with local radiation  therapy.  He understands radiation therapy also could be used for palliative treatment in the future for local control if necessary.  He also would be at an increased risk for bleeding complications after radiation therapy considering his chronic anticoagulation.  Hemostasis family are going to consider their options would appear to be leaning toward utilizing androgen deprivation therapy and possibly delaying treatment for the near future and possibly utilizing treatment on an intermittent basis as a way to decrease the potential toxicity.  All of his questions and his family's questions were answered to their stated satisfaction today.  He will plan to notify Dr. Jasmine December of his decision.  I also offered to help about continued care after Dr. Delton Coombes departure from our practice this summer.  Cc: Dr. Rolan Bucco Dr. Berneta Sages Dr. Arloa Koh Dr. Zola Button   A total of 35 minutes were spent in the overall care of the patient today with 35 minutes in direct face to face consultation.    Signatures Electronically signed by : Raynelle Bring, M.D.; Jul 16 2013 12:50PM EST

## 2013-07-16 NOTE — Progress Notes (Signed)
Met with patient and his family as part of Prostate MDC.  Reintroduced my role as his navigator and encouraged him to call as he proceeds with treatments and appointments at [facility].  Provided the accompanying Care Plan Summary:                                         Care Plan Summary  Name:  Christopher [name]. Cope DOB:  Jul 10, 2026 Your Medical Team:   Urologist -  Dr. Raynelle Bring, [facility]  Radiation Oncologist - Dr. Arloa Koh, [facility]   Medical Oncologist - Dr. Zola Button, [place]  Recommendations: 1) Androgen Deprivation Therapy (hormone therapy) 2) POSSIBLY Radiation Therapy later. * These recommendations are based on information available as of today's consult.      Recommendations may change depending on the results of further tests or exams.  Next Steps: 1) [facility] to arrange appt. When appointments need to be scheduled, you will be contacted by [facility] and/or [facility].  Questions? Please do not hesitate to call Gayleen Orem, RN, BSN, [facility] at [phone] with any questions or concerns.  Liliane Channel is Counsellor and is available to assist you while you're receiving your medical care at [facility]. ________________________________________________________________________________  I encouraged him to call me with any questions or concerns as his treatments progress.  He indicated understanding.  Gayleen Orem, RN, BSN, [facility] Prostate Oncology Navigator [phone]

## 2013-07-16 NOTE — Progress Notes (Signed)
[facility] NEW PATIENT EVALUATION  Name: Christopher [name] MRN: [id number]  Date:   07/16/2013           DOB: [birth year]  Status: outpatient   CC: Tivis Ringer, MD  Dutch Gray, MD , Dr. Rolan Bucco   REFERRING PHYSICIAN: Dutch Gray, MD   DIAGNOSIS: Stage T2a high-risk adenocarcinoma prostate    HISTORY OF PRESENT ILLNESS:  Christopher [name] is a 80 y.o. male who is seen today at the multidisciplinary prostate clinic through the courtesy of Dr. Alinda Money for evaluation of his stage T2a high-risk adenocarcinoma prostate. He presented to Dr. Jasmine December with a prostatic nodule, urethral polyp, and microscopic hematuria. His PSA was noted to be 6.4 with a palpable nodule along the left apex. He underwent ultrasound-guided biopsies along with cystoscopy which showed a polypoid lesion along the prostatic urethra. Biopsies the prostate revealed Gleason 8 (4+4) involving 60% of one core and 20 and 70% of 2 cores from the left medial apex. The urethra biopsy also showed Gleason 8 (4+4) involving 70% of the tissue. Gland volume was approximately 35 cc. His staging workup included a bone scan which showed focal uptake along the skull base. Whether not this represents metastatic disease is uncertain. His I PSS score is 18 indicative of moderate obstructive symptomatology. He has erectile dysfunction. He has multiple medical comorbidities, primarily heart disease and renal insufficiency.  PREVIOUS RADIATION THERAPY: No   PAST MEDICAL HISTORY:  has a past medical history of Lumbar disc disease; BPH (benign prostatic hyperplasia); HTN (hypertension); Diabetes mellitus type II; Sinus node dysfunction; Pacemaker; CAD (coronary artery disease); Arthritis; History of skin cancer; Frequency of urination; Nocturia; Chronic kidney disease; Cancer; Dysrhythmia; Prostate cancer; Ruptured disk (1992); and Skin cancer.     PAST SURGICAL HISTORY:  Past Surgical History  Procedure  Laterality Date  . Coronary artery bypass graft  2003    x3  . Cardiac catheterization    . Lumbar laminectomy  11/01  . Cardioversion  08/08/2011    Procedure: CARDIOVERSION;  Surgeon: Jacolyn Reedy, MD;  Location: [place];  Service: Cardiovascular;  Laterality: N/A;  . Cardioversion N/A 07/16/2012    Procedure: CARDIOVERSION;  Surgeon: Jacolyn Reedy, MD;  Location: [place];  Service: Cardiovascular;  Laterality: N/A;  . Pacemaker insertion  2012  . Cataracts removed    . Cystoscopy with biopsy N/A 06/21/2013    Procedure: CYSTOSCOPY WITH BIOPSY;  Surgeon: Molli Hazard, MD;  Location: [facility];  Service: Urology;  Laterality: N/A;    TUR RESECTION OF POLYP BILATERAL RETROGRADE PYELOGRAM BLADDER BIOPSY    . Prostate biopsy N/A 06/21/2013    Procedure: BIOPSY TRANSRECTAL ULTRASONIC PROSTATE (TUBP);  Surgeon: Molli Hazard, MD;  Location: [facility];  Service: Urology;  Laterality: N/A;  PROSTATE NERVE BLOCK  . Appendectomy  1980     FAMILY HISTORY: family history includes Cancer in his father and mother. His father died of esophageal cancer 68. He did have prostate cancer, treated by surgery. His mother died of breast cancer 59.   SOCIAL HISTORY:  reports that he quit smoking about 45 years ago. His smoking use included Cigarettes. He smoked 0.75 packs per day. He has never used smokeless tobacco. He reports that he does not drink alcohol or use illicit drugs. m Married, 3 sons. 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   ALLERGIES: Crestor; Lipitor; Vicodin; and Penicillins   MEDICATIONS:  Current Outpatient Prescriptions  Medication  Sig Dispense Refill  . amiodarone (PACERONE) 200 MG tablet Take 200 mg by mouth every morning.      Marland Kitchen amLODipine (NORVASC) 10 MG tablet Take 10 mg by mouth every morning.       Marland Kitchen atenolol (TENORMIN) 25 MG tablet Take 25 mg by mouth every morning.       . finasteride (PROSCAR) 5 MG tablet Take 5 mg by mouth daily.        . folic  acid (FOLVITE) 607 MCG tablet Take 400 mcg by mouth daily.        . furosemide (LASIX) 40 MG tablet Take 40 mg by mouth 2 (two) times daily.      Marland Kitchen glimepiride (AMARYL) 1 MG tablet Take 1 mg by mouth daily before breakfast.        . Glucosamine HCl 1500 MG TABS Take 1 tablet by mouth 2 (two) times daily.       . Horse Chestnut 300 MG CAPS Take 1 capsule by mouth daily.       . Multiple Vitamin (MULTIVITAMIN WITH MINERALS) TABS tablet Take 1 tablet by mouth daily.      . Omega-3 Fatty Acids (FISH OIL) 1200 MG CAPS Take 1,200 mg by mouth daily.      Marland Kitchen senna-docusate (SENOKOT S) 8.6-50 MG per tablet Take 1 tablet by mouth 2 (two) times daily.  60 tablet  0  . warfarin (COUMADIN) 5 MG tablet Take 2.5-5 mg by mouth daily. 1 tablet Monday, Wednesday and Friday. The 0.5 tablet all other days      . hyoscyamine (LEVSIN, ANASPAZ) 0.125 MG tablet Take 1 tablet (0.125 mg total) by mouth every 4 (four) hours as needed (bladder spasms).  40 tablet  4  . traMADol (ULTRAM) 50 MG tablet Take 1-2 tablets (50-100 mg total) by mouth every 6 (six) hours as needed for moderate pain.  40 tablet  0   No current facility-administered medications for this encounter.     REVIEW OF SYSTEMS:  Pertinent items are noted in HPI.    PHYSICAL EXAM:  height is 6\' 1"  (1.854 m) and weight is 235 lb 3.2 oz (106.686 kg). His oral temperature is 98 F (36.7 C). His blood pressure is 134/69 and his pulse is 63. His respiration is 16 and oxygen saturation is 100%.   He is alert and oriented. He's not examined today.   LABORATORY DATA:  Lab Results  Component Value Date   WBC 9.3 06/15/2013   HGB 12.7* 06/15/2013   HCT 39.9 06/15/2013   MCV 90.9 06/15/2013   PLT 344 06/15/2013   Lab Results  Component Value Date   NA 143 06/21/2013   K 4.6 06/21/2013   CL 103 06/21/2013   CO2 26 06/21/2013   No results found for this basename: ALT, AST, GGT, ALKPHOS, BILITOT   PSA 6.4   IMPRESSION: Clinical stage T2a high-risk adenocarcinoma  prostate. I explained to the patient and his family that his prognosis is related to his stage, PSA level, and Gleason score. His stage and PSA level are favorable while his Gleason score of 8 is distinctly unfavorable. We discussed evaluation of the isolated area of uptake seen on his skull base and conference, and this could be further evaluated by CT/F 18 PET scan if he wants to consider potentially curative therapy. We discussed the potential acute and late toxicities of radiation therapy. Our collective feeling is that he probably has a limited life expectancy, and that we can  probably control his disease for at least a few years before he becomes symptomatic from metastatic disease. He and his family understand that even though radiation therapy as well tolerated, there are certainly acute and late toxicities that may affect the quality of his life. We'll start off with androgen deprivation therapy, and if he wants to consider curative treatment we can begin this in as early as 3-4 months from now. The patient's family does not feel anxious about curative treatment, particularly in view of his medical comorbidities. We also discussed the potential side effects of androgen deprivation therapy. If he finds the side effects of androgen deprivation therapy intolerable then we could proceed with radiation therapy alone for at least local control.  I'm certainly in agreement with androgen deprivation therapy alone, perhaps on an intermittent schedule.   PLAN: As discussed above.  I spent 30 minutes minutes face to face with the patient and more than 50% of that time was spent in counseling and/or coordination of care.

## 2013-07-16 NOTE — Progress Notes (Signed)
Met with patient in [place]. Family members also accompanied him. Provided education about Support Center activities and programs. Went over distress screen with patient. He denied having distress worth noting. His most immediate need was for information and he felt he was getting good information at the clinic.  Epifania Gore, [facility], PhD Chaplain

## 2013-07-16 NOTE — Addendum Note (Signed)
Encounter addended by: Brooks Sailors, RN on: 07/16/2013  4:08 PM<BR>     Documentation filed: Visit Diagnoses, Notes Section

## 2013-07-16 NOTE — Progress Notes (Signed)
Please see consult note.  

## 2013-07-16 NOTE — Consult Note (Signed)
Reason for Referral: Prostate cancer.   HPI: 80 year old gentleman [place] where he lived the majority of his life. He is a gentleman with you comorbid conditions includes hypertension and coronary heart disease as well as diabetes. He presented with elevated PSA and  prostate nodule, and hematuria with bladder lesion. He is found to have an elevated PSA which is chronic. It was 6.4 in December 2014. He was evaluated by Dr. Jasmine December and found to have this nodule to be approximately 1.5 cm in size. It is located at the left apex of the prostate. It is not tender to palpation. He has a history of chronic renal insufficiency which has prevented him from having contrasted studies. He underwent transurethral resection of urethral polyp, bladder biopsy, bilateral retrograde pyelograms, transrectal ultrasound guided prostate biopsy with bilateral prostate nerve block on 06/21/13. The pathology revealed prostate adenocarcinoma Gleason score 4+4 equals 8 on the left lateral apex as well as the left medial apex and the urethral lesion. For that reason, patient was referred for an evaluation in the multidisciplinary prostate cancer clinic. Clinically, he is feeling much better at this time. He is not reporting any hematuria or dysuria. He does report frequency urgency and nocturia. He has not reported any constitutional symptoms of fevers or chills or sweats. Has not reported any weight loss or appetite changes. Has not reported any headaches blurred vision or double vision. Has not reported any sensory neuropathy. Has not reported any chest pain shortness of breath or difficulty breathing. Has not reported any nausea or vomiting or abdominal pain. Has not reported any hematochezia or melena. Does reports back pain but have been chronic in nature. Does not report any lymphadenopathy or petechiae or rash. He is still a relatively functional and able to drive. He attends to most activities of daily  living.   Past Medical History  Diagnosis Date  . Lumbar disc disease   . BPH (benign prostatic hyperplasia)   . HTN (hypertension)   . Diabetes mellitus type II   . Sinus node dysfunction     symptomatic bradycardia  . Pacemaker     Medtronic-dual-chamber-DOI 2012  . CAD (coronary artery disease)     followed by Dr. Wynonia Lawman  . Arthritis   . History of skin cancer   . Frequency of urination   . Nocturia   . Chronic kidney disease     kidneys function at "20 %" followed by Dr. Mercy Moore  . Cancer     history of skin cancer  . Dysrhythmia     a fib  . Prostate cancer   . Ruptured disk 1992  . Skin cancer   :  Past Surgical History  Procedure Laterality Date  . Coronary artery bypass graft  2003    x3  . Cardiac catheterization    . Lumbar laminectomy  11/01  . Cardioversion  08/08/2011    Procedure: CARDIOVERSION;  Surgeon: Jacolyn Reedy, MD;  Location: [place];  Service: Cardiovascular;  Laterality: N/A;  . Cardioversion N/A 07/16/2012    Procedure: CARDIOVERSION;  Surgeon: Jacolyn Reedy, MD;  Location: [place];  Service: Cardiovascular;  Laterality: N/A;  . Pacemaker insertion  2012  . Cataracts removed    . Cystoscopy with biopsy N/A 06/21/2013    Procedure: CYSTOSCOPY WITH BIOPSY;  Surgeon: Molli Hazard, MD;  Location: [facility];  Service: Urology;  Laterality: N/A;    TUR RESECTION OF POLYP BILATERAL RETROGRADE PYELOGRAM BLADDER BIOPSY    .  Prostate biopsy N/A 06/21/2013    Procedure: BIOPSY TRANSRECTAL ULTRASONIC PROSTATE (TUBP);  Surgeon: Molli Hazard, MD;  Location: [facility];  Service: Urology;  Laterality: N/A;  PROSTATE NERVE BLOCK  . Appendectomy  1980  :  Current Outpatient Prescriptions  Medication Sig Dispense Refill  . amiodarone (PACERONE) 200 MG tablet Take 200 mg by mouth every morning.      Marland Kitchen amLODipine (NORVASC) 10 MG tablet Take 10 mg by mouth every morning.       Marland Kitchen atenolol (TENORMIN) 25 MG tablet Take 25 mg by mouth every  morning.       . finasteride (PROSCAR) 5 MG tablet Take 5 mg by mouth daily.        . folic acid (FOLVITE) 607 MCG tablet Take 400 mcg by mouth daily.        . furosemide (LASIX) 40 MG tablet Take 40 mg by mouth 2 (two) times daily.      Marland Kitchen glimepiride (AMARYL) 1 MG tablet Take 1 mg by mouth daily before breakfast.        . Glucosamine HCl 1500 MG TABS Take 1 tablet by mouth 2 (two) times daily.       . Horse Chestnut 300 MG CAPS Take 1 capsule by mouth daily.       . hyoscyamine (LEVSIN, ANASPAZ) 0.125 MG tablet Take 1 tablet (0.125 mg total) by mouth every 4 (four) hours as needed (bladder spasms).  40 tablet  4  . Multiple Vitamin (MULTIVITAMIN WITH MINERALS) TABS tablet Take 1 tablet by mouth daily.      . Omega-3 Fatty Acids (FISH OIL) 1200 MG CAPS Take 1,200 mg by mouth daily.      Marland Kitchen senna-docusate (SENOKOT S) 8.6-50 MG per tablet Take 1 tablet by mouth 2 (two) times daily.  60 tablet  0  . traMADol (ULTRAM) 50 MG tablet Take 1-2 tablets (50-100 mg total) by mouth every 6 (six) hours as needed for moderate pain.  40 tablet  0  . warfarin (COUMADIN) 5 MG tablet Take 2.5-5 mg by mouth daily. 1 tablet Monday, Wednesday and Friday. The 0.5 tablet all other days       No current facility-administered medications for this visit.      Allergies  Allergen Reactions  . Crestor [Rosuvastatin]     Aches in joint   . Lipitor [Atorvastatin]     Joint aching  . Vicodin [Hydrocodone-Acetaminophen]     "Made me nervous"  . Penicillins Rash  :  Family History  Problem Relation Age of Onset  . Cancer Mother     breast  . Cancer Father     prostate, esophagus  :  History   Social History  . Marital Status: Married    Spouse Name: N/A    Number of Children: 3  . Years of Education: N/A   Occupational History  . retired    Social History Main Topics  . Smoking status: Former Smoker -- 0.75 packs/day    Types: Cigarettes    Quit date: 03/12/1968  . Smokeless tobacco: Never Used   . Alcohol Use: No  . Drug Use: No  . Sexual Activity: Not Currently   Other Topics Concern  . Not on file   Social History Narrative  . No narrative on file  :  Pertinent items are noted in HPI.  Exam: There were no vitals taken for this visit. General appearance: alert and cooperative Head: Normocephalic, without obvious abnormality, atraumatic Neck:  no adenopathy, no carotid bruit, no JVD, supple, symmetrical, trachea midline and thyroid not enlarged, symmetric, no tenderness/mass/nodules Back: negative Resp: clear to auscultation bilaterally Chest wall: no tenderness GI: soft, non-tender; bowel sounds normal; no masses,  no organomegaly Extremities: extremities normal, atraumatic, no cyanosis or edema Skin: Skin color, texture, turgor normal. No rashes or lesions Lymph nodes: Cervical, supraclavicular, and axillary nodes normal.   Dg Cervical Spine Complete  07/01/2013   CLINICAL DATA:  Followup CT and bone scans, prostate cancer high risk of metastasis  EXAM: CERVICAL SPINE  4+ VIEWS  COMPARISON:  Bone scan 06/30/2013  FINDINGS: Prevertebral soft tissues normal thickness.  Diffuse osseous demineralization.  Multilevel facet degenerative changes throughout cervical region.  Disc space narrowing with endplate spur formation C4-C5, C5-C6.  Encroachment upon multiple bilateral foramina by combinations of uncovertebral and facet hypertrophy, greatest at left C3-C4 and right C4-C5 and C3-C4.  Vertebral body heights maintained without fracture or subluxation.  Odontoid approximate right lateral mass of C1 though head is rotated to the left.  Mild facet degenerative changes at the C1-C2 articulation on right.  Atherosclerotic calcification of the left carotid system.  IMPRESSION: Multilevel degenerative disc and facet disease changes.  No acute abnormalities.  No definite abnormalities are seen at the left C1-C2 articulation radiographically.  It is uncertain whether the scintigraphic  abnormality is related to the craniocervical junction or to the left maxilla/skullbase ; if patient is at high risk for osseous metastasis, consider MR imaging to exclude skullbase metastasis.   Electronically Signed   By: Lavonia Dana M.D.   On: 07/01/2013 08:42   Nm Bone Scan Whole Body  06/30/2013   CLINICAL DATA:  Prostate cancer, PSA 6.41 03/09/2013  EXAM: NUCLEAR MEDICINE WHOLE BODY BONE SCAN  TECHNIQUE: Whole body anterior and posterior images were obtained approximately 3 hours after intravenous injection of radiopharmaceutical.  RADIOPHARMACEUTICALS:  27.5 Technetium-99 MDP  COMPARISON:  CT pelvis 06/30/2013  FINDINGS: There are no foci of increased or decreased radiotracer uptake to suggest osseous metastatic disease.  There is a focal area of increased radiotracer uptake to the left of midline just inferior to the level of the orbit at the expected location of the articulation between the lateral mass of C1 and C2.  There are increased foci of radiotracer uptake in the left [facility] joint, right patellofemoral compartment, left patellofemoral compartment and left medial femorotibial compartment consistent with osteoarthritic changes.  Normal physiologic activity is identified within the kidneys and urinary bladder.  IMPRESSION: 1. There is a focal area of increased radiotracer uptake to the left of midline just inferior to the level of the orbit at the expected location of the articulation between the lateral mass of C1 and C2. These likely represent degenerative change. Correlation with plain radiographs is recommended.  2.  Otherwise no scintigraphic evidence of metastatic disease.   Electronically Signed   By: Kathreen Devoid   On: 06/30/2013 14:23    Assessment and Plan:   80 year old gentleman with multiple comorbid conditions and found to have hematuria and elevated PSA of 6.4. Biopsy showed a Gleason score 4+4 adenocarcinoma involving a urethra lesion as well as a prostate nodule. He is status post  biopsy as well as urethral dilation. Patient clinically feeling much better with very little symptoms. His case was discussed today in the genitourinary cancer conference as well as the prostate cancer multidisciplinary clinic. His pathology was reviewed with the reviewing pathologists as well as his imaging studies were reviewed with radiology.  He represents a rather unique situation with likely ductal adenocarcinoma of the prostate which involving a urethral lesion as well as prostate nodule. Given his age and comorbid conditions, I don't imagine curative options are available for this gentleman. I think is reasonable to really think palliation of symptoms and preventing of recurrence of his urinary symptoms. I do not think systemic therapy as I must at this time but I do feel local therapy with possible palliative radiation therapy combined with intermittent surgical intervention may be needed for this gentleman to prevent further symptoms associated with local recurrences. I hesitate about introducing androgen deprivation in this gentleman due to the unique nature of his prostate pathology as well as the cardiovascular symptoms that could cause in this gentleman that has already have coronary disease. He'll have a discussion with Dr. Valere Dross today about the role of radiation therapy as well.  All his questions are answered today to his family satisfaction.

## 2013-07-20 ENCOUNTER — Other Ambulatory Visit: Payer: Self-pay | Admitting: Cardiology

## 2013-07-20 ENCOUNTER — Ambulatory Visit
Admission: RE | Admit: 2013-07-20 | Discharge: 2013-07-20 | Disposition: A | Discharge status: Home / Self Care | Payer: Medicare Other | Source: Ambulatory Visit | Attending: Cardiology | Admitting: Cardiology

## 2013-07-20 DIAGNOSIS — I10 Essential (primary) hypertension: Secondary | ICD-10-CM | POA: Diagnosis not present

## 2013-07-20 DIAGNOSIS — R0602 Shortness of breath: Secondary | ICD-10-CM

## 2013-07-20 DIAGNOSIS — I119 Hypertensive heart disease without heart failure: Secondary | ICD-10-CM | POA: Diagnosis not present

## 2013-07-20 DIAGNOSIS — Z7901 Long term (current) use of anticoagulants: Secondary | ICD-10-CM | POA: Diagnosis not present

## 2013-07-20 DIAGNOSIS — Z951 Presence of aortocoronary bypass graft: Secondary | ICD-10-CM | POA: Diagnosis not present

## 2013-07-20 DIAGNOSIS — R0989 Other specified symptoms and signs involving the circulatory and respiratory systems: Secondary | ICD-10-CM | POA: Diagnosis not present

## 2013-07-20 DIAGNOSIS — Z01810 Encounter for preprocedural cardiovascular examination: Secondary | ICD-10-CM | POA: Diagnosis not present

## 2013-07-20 DIAGNOSIS — N183 Chronic kidney disease, stage 3 unspecified: Secondary | ICD-10-CM | POA: Diagnosis not present

## 2013-07-20 DIAGNOSIS — I4891 Unspecified atrial fibrillation: Secondary | ICD-10-CM | POA: Diagnosis not present

## 2013-07-20 DIAGNOSIS — R0609 Other forms of dyspnea: Secondary | ICD-10-CM | POA: Diagnosis not present

## 2013-07-20 DIAGNOSIS — I2510 Atherosclerotic heart disease of native coronary artery without angina pectoris: Secondary | ICD-10-CM | POA: Diagnosis not present

## 2013-07-20 DIAGNOSIS — E785 Hyperlipidemia, unspecified: Secondary | ICD-10-CM | POA: Diagnosis not present

## 2013-07-24 ENCOUNTER — Encounter (HOSPITAL_COMMUNITY): Payer: Self-pay | Admitting: Emergency Medicine

## 2013-07-24 ENCOUNTER — Inpatient Hospital Stay (HOSPITAL_COMMUNITY)
Admission: EM | Admit: 2013-07-24 | Discharge: 2013-07-28 | DRG: 291 | Disposition: A | Discharge status: Home / Self Care | Payer: Medicare Other | Attending: Internal Medicine | Admitting: Internal Medicine

## 2013-07-24 ENCOUNTER — Emergency Department (HOSPITAL_COMMUNITY): Payer: Medicare Other

## 2013-07-24 DIAGNOSIS — Z950 Presence of cardiac pacemaker: Secondary | ICD-10-CM | POA: Diagnosis not present

## 2013-07-24 DIAGNOSIS — Z9229 Personal history of other drug therapy: Secondary | ICD-10-CM

## 2013-07-24 DIAGNOSIS — I5031 Acute diastolic (congestive) heart failure: Secondary | ICD-10-CM | POA: Diagnosis present

## 2013-07-24 DIAGNOSIS — N058 Unspecified nephritic syndrome with other morphologic changes: Secondary | ICD-10-CM | POA: Diagnosis present

## 2013-07-24 DIAGNOSIS — Z87891 Personal history of nicotine dependence: Secondary | ICD-10-CM | POA: Diagnosis not present

## 2013-07-24 DIAGNOSIS — I10 Essential (primary) hypertension: Secondary | ICD-10-CM | POA: Diagnosis not present

## 2013-07-24 DIAGNOSIS — Z85828 Personal history of other malignant neoplasm of skin: Secondary | ICD-10-CM

## 2013-07-24 DIAGNOSIS — Z880 Allergy status to penicillin: Secondary | ICD-10-CM

## 2013-07-24 DIAGNOSIS — I119 Hypertensive heart disease without heart failure: Secondary | ICD-10-CM | POA: Diagnosis not present

## 2013-07-24 DIAGNOSIS — I5033 Acute on chronic diastolic (congestive) heart failure: Secondary | ICD-10-CM | POA: Diagnosis present

## 2013-07-24 DIAGNOSIS — E785 Hyperlipidemia, unspecified: Secondary | ICD-10-CM | POA: Diagnosis present

## 2013-07-24 DIAGNOSIS — Z888 Allergy status to other drugs, medicaments and biological substances status: Secondary | ICD-10-CM

## 2013-07-24 DIAGNOSIS — R911 Solitary pulmonary nodule: Secondary | ICD-10-CM

## 2013-07-24 DIAGNOSIS — Z8546 Personal history of malignant neoplasm of prostate: Secondary | ICD-10-CM

## 2013-07-24 DIAGNOSIS — I2510 Atherosclerotic heart disease of native coronary artery without angina pectoris: Secondary | ICD-10-CM | POA: Diagnosis not present

## 2013-07-24 DIAGNOSIS — I130 Hypertensive heart and chronic kidney disease with heart failure and stage 1 through stage 4 chronic kidney disease, or unspecified chronic kidney disease: Secondary | ICD-10-CM | POA: Diagnosis not present

## 2013-07-24 DIAGNOSIS — J9600 Acute respiratory failure, unspecified whether with hypoxia or hypercapnia: Secondary | ICD-10-CM | POA: Diagnosis present

## 2013-07-24 DIAGNOSIS — E1129 Type 2 diabetes mellitus with other diabetic kidney complication: Secondary | ICD-10-CM | POA: Diagnosis not present

## 2013-07-24 DIAGNOSIS — Z7901 Long term (current) use of anticoagulants: Secondary | ICD-10-CM | POA: Diagnosis not present

## 2013-07-24 DIAGNOSIS — N183 Chronic kidney disease, stage 3 unspecified: Secondary | ICD-10-CM

## 2013-07-24 DIAGNOSIS — J9819 Other pulmonary collapse: Secondary | ICD-10-CM | POA: Diagnosis not present

## 2013-07-24 DIAGNOSIS — Z951 Presence of aortocoronary bypass graft: Secondary | ICD-10-CM | POA: Diagnosis not present

## 2013-07-24 DIAGNOSIS — N189 Chronic kidney disease, unspecified: Principal | ICD-10-CM

## 2013-07-24 DIAGNOSIS — I509 Heart failure, unspecified: Secondary | ICD-10-CM | POA: Diagnosis not present

## 2013-07-24 DIAGNOSIS — R0602 Shortness of breath: Secondary | ICD-10-CM

## 2013-07-24 DIAGNOSIS — J811 Chronic pulmonary edema: Secondary | ICD-10-CM | POA: Diagnosis not present

## 2013-07-24 DIAGNOSIS — C61 Malignant neoplasm of prostate: Secondary | ICD-10-CM

## 2013-07-24 DIAGNOSIS — J984 Other disorders of lung: Secondary | ICD-10-CM | POA: Diagnosis present

## 2013-07-24 DIAGNOSIS — N400 Enlarged prostate without lower urinary tract symptoms: Secondary | ICD-10-CM | POA: Diagnosis present

## 2013-07-24 DIAGNOSIS — Z885 Allergy status to narcotic agent status: Secondary | ICD-10-CM

## 2013-07-24 DIAGNOSIS — M519 Unspecified thoracic, thoracolumbar and lumbosacral intervertebral disc disorder: Secondary | ICD-10-CM

## 2013-07-24 DIAGNOSIS — I4891 Unspecified atrial fibrillation: Secondary | ICD-10-CM | POA: Diagnosis present

## 2013-07-24 DIAGNOSIS — I379 Nonrheumatic pulmonary valve disorder, unspecified: Secondary | ICD-10-CM

## 2013-07-24 DIAGNOSIS — I480 Paroxysmal atrial fibrillation: Secondary | ICD-10-CM | POA: Diagnosis present

## 2013-07-24 DIAGNOSIS — N185 Chronic kidney disease, stage 5: Secondary | ICD-10-CM | POA: Diagnosis present

## 2013-07-24 LAB — PROTIME-INR
INR: 1.93 — ABNORMAL HIGH (ref 0.00–1.49)
INR: 1.68 — ABNORMAL HIGH (ref 0.00–1.49)
Prothrombin Time: 21.5 seconds — ABNORMAL HIGH (ref 11.6–15.2)
Prothrombin Time: 19.3 seconds — ABNORMAL HIGH (ref 11.6–15.2)

## 2013-07-24 LAB — CBC WITH DIFFERENTIAL/PLATELET
Basophils Absolute: 0 10*3/uL (ref 0.0–0.1)
Basophils Relative: 0 % (ref 0–1)
EOS ABS: 0 10*3/uL (ref 0.0–0.7)
EOS PCT: 0 % (ref 0–5)
HCT: 34.8 % — ABNORMAL LOW (ref 39.0–52.0)
HEMOGLOBIN: 10.9 g/dL — AB (ref 13.0–17.0)
LYMPHS ABS: 1 10*3/uL (ref 0.7–4.0)
Lymphocytes Relative: 8 % — ABNORMAL LOW (ref 12–46)
MCH: 28.9 pg (ref 26.0–34.0)
MCHC: 31.3 g/dL (ref 30.0–36.0)
MCV: 92.3 fL (ref 78.0–100.0)
MONOS PCT: 8 % (ref 3–12)
Monocytes Absolute: 1 10*3/uL (ref 0.1–1.0)
Neutro Abs: 10.5 10*3/uL — ABNORMAL HIGH (ref 1.7–7.7)
Neutrophils Relative %: 84 % — ABNORMAL HIGH (ref 43–77)
PLATELETS: 298 10*3/uL (ref 150–400)
RBC: 3.77 MIL/uL — ABNORMAL LOW (ref 4.22–5.81)
RDW: 16.4 % — ABNORMAL HIGH (ref 11.5–15.5)
WBC: 12.5 10*3/uL — ABNORMAL HIGH (ref 4.0–10.5)

## 2013-07-24 LAB — BASIC METABOLIC PANEL
BUN: 54 mg/dL — AB (ref 6–23)
CHLORIDE: 106 meq/L (ref 96–112)
CO2: 25 meq/L (ref 19–32)
Calcium: 9 mg/dL (ref 8.4–10.5)
Creatinine, Ser: 2.79 mg/dL — ABNORMAL HIGH (ref 0.50–1.35)
GFR calc Af Amer: 22 mL/min — ABNORMAL LOW (ref >90)
GFR calc non Af Amer: 19 mL/min — ABNORMAL LOW (ref >90)
GLUCOSE: 121 mg/dL — AB (ref 70–99)
Potassium: 4.2 mEq/L (ref 3.7–5.3)
Sodium: 144 mEq/L (ref 137–147)

## 2013-07-24 LAB — TROPONIN I
Troponin I: <0.3 ng/mL (ref <0.30)
Troponin I: <0.3 ng/mL (ref <0.30)

## 2013-07-24 LAB — CBC
HCT: 32.1 % — ABNORMAL LOW (ref 39.0–52.0)
HEMOGLOBIN: 10.1 g/dL — AB (ref 13.0–17.0)
MCH: 28.7 pg (ref 26.0–34.0)
MCHC: 31.5 g/dL (ref 30.0–36.0)
MCV: 91.2 fL (ref 78.0–100.0)
PLATELETS: 282 10*3/uL (ref 150–400)
RBC: 3.52 MIL/uL — ABNORMAL LOW (ref 4.22–5.81)
RDW: 16.4 % — AB (ref 11.5–15.5)
WBC: 9 10*3/uL (ref 4.0–10.5)

## 2013-07-24 LAB — GLUCOSE, CAPILLARY
GLUCOSE-CAPILLARY: 112 mg/dL — AB (ref 70–99)
Glucose-Capillary: 143 mg/dL — ABNORMAL HIGH (ref 70–99)
Glucose-Capillary: 100 mg/dL — ABNORMAL HIGH (ref 70–99)

## 2013-07-24 LAB — PRO B NATRIURETIC PEPTIDE: Pro B Natriuretic peptide (BNP): 9261 pg/mL — ABNORMAL HIGH (ref 0–450)

## 2013-07-24 MED ORDER — INSULIN ASPART 100 UNIT/ML ~~LOC~~ SOLN
0.0000 [IU] | Freq: Three times a day (TID) | SUBCUTANEOUS | Status: DC
  Administered 2013-07-24: 1 [IU] via SUBCUTANEOUS
  Administered 2013-07-25 – 2013-07-27 (×2): 2 [IU] via SUBCUTANEOUS

## 2013-07-24 MED ORDER — HYOSCYAMINE SULFATE 0.125 MG PO TABS
0.1250 mg | ORAL_TABLET | ORAL | Status: DC | PRN
  Filled 2013-07-24: qty 1

## 2013-07-24 MED ORDER — GLIMEPIRIDE 1 MG PO TABS
1.0000 mg | ORAL_TABLET | Freq: Every day | ORAL | Status: DC
  Administered 2013-07-24 – 2013-07-28 (×5): 1 mg via ORAL
  Filled 2013-07-24 (×6): qty 1

## 2013-07-24 MED ORDER — AMIODARONE HCL 200 MG PO TABS
200.0000 mg | ORAL_TABLET | Freq: Every morning | ORAL | Status: DC
  Administered 2013-07-24 – 2013-07-28 (×5): 200 mg via ORAL
  Filled 2013-07-24 (×5): qty 1

## 2013-07-24 MED ORDER — FINASTERIDE 5 MG PO TABS
5.0000 mg | ORAL_TABLET | Freq: Every day | ORAL | Status: DC
  Administered 2013-07-24 – 2013-07-28 (×5): 5 mg via ORAL
  Filled 2013-07-24 (×5): qty 1

## 2013-07-24 MED ORDER — WARFARIN - PHARMACIST DOSING INPATIENT
Freq: Every day | Status: DC
  Administered 2013-07-27: 18:00:00

## 2013-07-24 MED ORDER — AMLODIPINE BESYLATE 10 MG PO TABS
10.0000 mg | ORAL_TABLET | Freq: Every morning | ORAL | Status: DC
  Administered 2013-07-24: 10 mg via ORAL
  Filled 2013-07-24 (×2): qty 1

## 2013-07-24 MED ORDER — SODIUM CHLORIDE 0.9 % IJ SOLN: 3.0000 mL | INTRAMUSCULAR | Status: DC | PRN

## 2013-07-24 MED ORDER — FOLIC ACID 1 MG PO TABS
1.0000 mg | ORAL_TABLET | Freq: Every day | ORAL | Status: DC
  Administered 2013-07-24 – 2013-07-28 (×5): 1 mg via ORAL
  Filled 2013-07-24 (×5): qty 1

## 2013-07-24 MED ORDER — WARFARIN SODIUM 4 MG PO TABS
4.0000 mg | ORAL_TABLET | Freq: Every evening | ORAL | Status: DC
  Administered 2013-07-24: 4 mg via ORAL
  Filled 2013-07-24 (×2): qty 1

## 2013-07-24 MED ORDER — FUROSEMIDE 10 MG/ML IJ SOLN
40.0000 mg | Freq: Two times a day (BID) | INTRAMUSCULAR | Status: DC
  Filled 2013-07-24 (×2): qty 4

## 2013-07-24 MED ORDER — SODIUM CHLORIDE 0.9 % IJ SOLN
3.0000 mL | Freq: Two times a day (BID) | INTRAMUSCULAR | Status: DC
  Administered 2013-07-24 – 2013-07-28 (×7): 3 mL via INTRAVENOUS

## 2013-07-24 MED ORDER — ADULT MULTIVITAMIN W/MINERALS CH
1.0000 | ORAL_TABLET | Freq: Every day | ORAL | Status: DC
  Administered 2013-07-24 – 2013-07-28 (×5): 1 via ORAL
  Filled 2013-07-24 (×5): qty 1

## 2013-07-24 MED ORDER — ALBUTEROL SULFATE (2.5 MG/3ML) 0.083% IN NEBU: 2.5000 mg | INHALATION_SOLUTION | RESPIRATORY_TRACT | Status: DC | PRN

## 2013-07-24 MED ORDER — INSULIN ASPART 100 UNIT/ML ~~LOC~~ SOLN: 0.0000 [IU] | Freq: Every day | SUBCUTANEOUS | Status: DC

## 2013-07-24 MED ORDER — ATENOLOL 25 MG PO TABS
25.0000 mg | ORAL_TABLET | Freq: Every morning | ORAL | Status: DC
  Administered 2013-07-24 – 2013-07-28 (×5): 25 mg via ORAL
  Filled 2013-07-24 (×5): qty 1

## 2013-07-24 MED ORDER — SODIUM CHLORIDE 0.9 % IV SOLN: 250.0000 mL | INTRAVENOUS | Status: DC | PRN

## 2013-07-24 MED ORDER — OMEGA-3-ACID ETHYL ESTERS 1 G PO CAPS
1000.0000 mg | ORAL_CAPSULE | Freq: Every day | ORAL | Status: DC
  Administered 2013-07-24 – 2013-07-28 (×5): 1000 mg via ORAL
  Filled 2013-07-24 (×5): qty 1

## 2013-07-24 MED ORDER — FUROSEMIDE 10 MG/ML IJ SOLN
80.0000 mg | Freq: Two times a day (BID) | INTRAMUSCULAR | Status: DC
  Administered 2013-07-24 – 2013-07-27 (×6): 80 mg via INTRAVENOUS
  Filled 2013-07-24 (×7): qty 8

## 2013-07-24 MED ORDER — FUROSEMIDE 10 MG/ML IJ SOLN
40.0000 mg | Freq: Once | INTRAMUSCULAR | Status: AC
  Administered 2013-07-24: 40 mg via INTRAVENOUS
  Filled 2013-07-24: qty 4

## 2013-07-24 NOTE — Progress Notes (Signed)
Echo Lab  2D Echocardiogram completed.  [place], [employer] 07/24/2013 12:07 PM

## 2013-07-24 NOTE — H&P (Signed)
Patient's PCP: Tivis Ringer, MD Patient's cardiologist: Dr. Wynonia Lawman Patient's nephrologist: Dr. Mercy Moore  Chief Complaint: Shortness of breath  History of Present Illness: Christopher [name] is a 80 y.o. Caucasian male with history of type 2 diabetes, hypertension, BPH, coronary artery disease, status post pacemaker, arthritis, chronic kidney disease stage III, A. fib on chronic anticoagulation, and prostate cancer presents with the above complaints.  Patient reports that his symptoms started about 3 days ago when he started feeling short of breath.  Initially his symptoms were limited to the wheezing which since then has progressed.  Today he was even more short of breath as a result he presented to the emergency department for further evaluation.  Over the last few weeks, he has noted that he has been increasingly short of breath with movement or any activity.  In the emergency department, chest x-ray showed pulmonary edema and findings suggestive of congestive heart failure.  Hospitalist service was asked to admit the patient for further care and management.  Patient denies any recent fevers, chills, nausea, vomiting, chest pain, abdominal pain, diarrhea, headaches or vision changes.  Review of Systems: All systems reviewed with the patient and positive as per history of present illness, otherwise all other systems are negative.  Past Medical History  Diagnosis Date  . Lumbar disc disease   . BPH (benign prostatic hyperplasia)   . HTN (hypertension)   . Diabetes mellitus type II   . Sinus node dysfunction     symptomatic bradycardia  . Pacemaker     Medtronic-dual-chamber-DOI 2012  . CAD (coronary artery disease)     followed by Dr. Wynonia Lawman  . Arthritis   . History of skin cancer   . Frequency of urination   . Nocturia   . Chronic kidney disease     kidneys function at "20 %" followed by Dr. Mercy Moore  . Cancer     history of skin cancer  . Dysrhythmia     a fib  . Prostate cancer    . Ruptured disk 1992  . Skin cancer    Past Surgical History  Procedure Laterality Date  . Coronary artery bypass graft  2003    x3  . Cardiac catheterization    . Lumbar laminectomy  11/01  . Cardioversion  08/08/2011    Procedure: CARDIOVERSION;  Surgeon: Jacolyn Reedy, MD;  Location: [place];  Service: Cardiovascular;  Laterality: N/A;  . Cardioversion N/A 07/16/2012    Procedure: CARDIOVERSION;  Surgeon: Jacolyn Reedy, MD;  Location: [place];  Service: Cardiovascular;  Laterality: N/A;  . Pacemaker insertion  2012  . Cataracts removed    . Cystoscopy with biopsy N/A 06/21/2013    Procedure: CYSTOSCOPY WITH BIOPSY;  Surgeon: Molli Hazard, MD;  Location: [facility];  Service: Urology;  Laterality: N/A;    TUR RESECTION OF POLYP BILATERAL RETROGRADE PYELOGRAM BLADDER BIOPSY    . Prostate biopsy N/A 06/21/2013    Procedure: BIOPSY TRANSRECTAL ULTRASONIC PROSTATE (TUBP);  Surgeon: Molli Hazard, MD;  Location: [facility];  Service: Urology;  Laterality: N/A;  PROSTATE NERVE BLOCK  . Appendectomy  1980   Family History  Problem Relation Age of Onset  . Cancer Mother     breast  . Cancer Father     prostate, esophagus   History   Social History  . Marital Status: Married    Spouse Name: N/A    Number of Children: 3  . Years of Education: N/A   Occupational History  .  retired    Social History Main Topics  . Smoking status: Former Smoker -- 0.75 packs/day    Types: Cigarettes    Quit date: 03/12/1968  . Smokeless tobacco: Never Used  . Alcohol Use: No  . Drug Use: No  . Sexual Activity: Not Currently   Other Topics Concern  . Not on file   Social History Narrative  . No narrative on file   Allergies: Crestor; Lipitor; Tramadol; Vicodin; and Penicillins  Home Meds: Prior to Admission medications   Medication Sig Start Date End Date Taking? Authorizing Provider  amiodarone (PACERONE) 200 MG tablet Take 200 mg by mouth every morning.   Yes  Historical Provider, MD  amLODipine (NORVASC) 10 MG tablet Take 10 mg by mouth every morning.    Yes Historical Provider, MD  atenolol (TENORMIN) 25 MG tablet Take 25 mg by mouth every morning.    Yes Historical Provider, MD  finasteride (PROSCAR) 5 MG tablet Take 5 mg by mouth daily.     Yes Historical Provider, MD  folic acid (FOLVITE) 962 MCG tablet Take 400 mcg by mouth daily.     Yes Historical Provider, MD  furosemide (LASIX) 40 MG tablet Take 40 mg by mouth 2 (two) times daily.   Yes Historical Provider, MD  glimepiride (AMARYL) 1 MG tablet Take 1 mg by mouth daily before breakfast.     Yes Historical Provider, MD  Glucosamine HCl 1500 MG TABS Take 1 tablet by mouth 2 (two) times daily.    Yes Historical Provider, MD  Horse Chestnut 300 MG CAPS Take 1 capsule by mouth daily.    Yes Historical Provider, MD  hyoscyamine (LEVSIN, ANASPAZ) 0.125 MG tablet Take 1 tablet (0.125 mg total) by mouth every 4 (four) hours as needed (bladder spasms). 06/21/13  Yes Sharyn Creamer, MD  Multiple Vitamin (MULTIVITAMIN WITH MINERALS) TABS tablet Take 1 tablet by mouth daily.   Yes Historical Provider, MD  Omega-3 Fatty Acids (FISH OIL) 1200 MG CAPS Take 1,200 mg by mouth daily.   Yes Historical Provider, MD  warfarin (COUMADIN) 4 MG tablet Take 4 mg by mouth every evening.   Yes Historical Provider, MD    Physical Exam: Blood pressure 117/59, pulse 61, temperature 98.3 F (36.8 C), temperature source Oral, resp. rate 15, height 5\' 8"  (1.727 m), SpO2 93.00%. General: Awake, Oriented x3, No acute distress. HEENT: EOMI, Moist mucous membranes Neck: Supple CV: Irregularly irregular, S1 and S2 Lungs: Crackles at bases bilaterally, no wheezing. Abdomen: Soft, Nontender, Nondistended, +bowel sounds. Ext: Good pulses.  2-3+ lower extremity edema. No clubbing or cyanosis noted. Neuro: Cranial Nerves II-XII grossly intact. Has 5/5 motor strength in upper and lower extremities.  Lab results:  Recent Labs   07/24/13 0120  NA 144  K 4.2  CL 106  CO2 25  GLUCOSE 121*  BUN 54*  CREATININE 2.79*  CALCIUM 9.0   No results found for this basename: AST, ALT, ALKPHOS, BILITOT, PROT, ALBUMIN,  in the last 72 hours No results found for this basename: LIPASE, AMYLASE,  in the last 72 hours  Recent Labs  07/24/13 0120  WBC 12.5*  NEUTROABS 10.5*  HGB 10.9*  HCT 34.8*  MCV 92.3  PLT 298    Recent Labs  07/24/13 0120  TROPONINI <0.30   No components found with this basename: POCBNP,  No results found for this basename: DDIMER,  in the last 72 hours No results found for this basename: HGBA1C,  in the last 72  hours No results found for this basename: CHOL, HDL, LDLCALC, TRIG, CHOLHDL, LDLDIRECT,  in the last 72 hours No results found for this basename: TSH, T4TOTAL, FREET3, T3FREE, THYROIDAB,  in the last 72 hours No results found for this basename: VITAMINB12, FOLATE, FERRITIN, TIBC, IRON, RETICCTPCT,  in the last 72 hours Imaging results:  Dg Chest 2 View  07/24/2013   CLINICAL DATA:  Acute shortness of breath.  EXAM: CHEST  2 VIEW  COMPARISON:  07/20/2013.  FINDINGS: Stable enlarged cardiac silhouette and post CABG changes. Stable left subclavian pacemaker leads. Mild increase in linear density at both lung bases. Increased prominence of the interstitial markings. No pleural fluid. Thoracolumbar spine degenerative changes.  IMPRESSION: 1. Interval mild interstitial pulmonary edema. 2. Increased bibasilar atelectasis. 3. Stable cardiomegaly   Electronically Signed   By: Enrique Sack M.D.   On: 07/24/2013 02:48   Dg Chest 2 View  07/20/2013   CLINICAL DATA:  Shortness of breath, heart surgery 10 years ago  EXAM: CHEST  2 VIEW  COMPARISON:  DG CHEST 2 VIEW dated 05/28/2013; DG CHEST 2 VIEW dated 03/29/2013; DG CHEST 2 VIEW dated 07/15/2012  FINDINGS: Mild cardiac enlargement. Cardiac pacer unchanged. Vascular pattern normal. Mild scarring or atelectasis in the lower lung zones bilaterally. This is  stable. No pleural effusion.  IMPRESSION: No acute findings   Electronically Signed   By: Skipper Cliche M.D.   On: 07/20/2013 14:10   Dg Cervical Spine Complete  07/01/2013   CLINICAL DATA:  Followup CT and bone scans, prostate cancer high risk of metastasis  EXAM: CERVICAL SPINE  4+ VIEWS  COMPARISON:  Bone scan 06/30/2013  FINDINGS: Prevertebral soft tissues normal thickness.  Diffuse osseous demineralization.  Multilevel facet degenerative changes throughout cervical region.  Disc space narrowing with endplate spur formation C4-C5, C5-C6.  Encroachment upon multiple bilateral foramina by combinations of uncovertebral and facet hypertrophy, greatest at left C3-C4 and right C4-C5 and C3-C4.  Vertebral body heights maintained without fracture or subluxation.  Odontoid approximate right lateral mass of C1 though head is rotated to the left.  Mild facet degenerative changes at the C1-C2 articulation on right.  Atherosclerotic calcification of the left carotid system.  IMPRESSION: Multilevel degenerative disc and facet disease changes.  No acute abnormalities.  No definite abnormalities are seen at the left C1-C2 articulation radiographically.  It is uncertain whether the scintigraphic abnormality is related to the craniocervical junction or to the left maxilla/skullbase ; if patient is at high risk for osseous metastasis, consider MR imaging to exclude skullbase metastasis.   Electronically Signed   By: Lavonia Dana M.D.   On: 07/01/2013 08:42   Nm Bone Scan Whole Body  06/30/2013   CLINICAL DATA:  Prostate cancer, PSA 6.41 03/09/2013  EXAM: NUCLEAR MEDICINE WHOLE BODY BONE SCAN  TECHNIQUE: Whole body anterior and posterior images were obtained approximately 3 hours after intravenous injection of radiopharmaceutical.  RADIOPHARMACEUTICALS:  27.5 Technetium-99 MDP  COMPARISON:  CT pelvis 06/30/2013  FINDINGS: There are no foci of increased or decreased radiotracer uptake to suggest osseous metastatic disease.   There is a focal area of increased radiotracer uptake to the left of midline just inferior to the level of the orbit at the expected location of the articulation between the lateral mass of C1 and C2.  There are increased foci of radiotracer uptake in the left [facility] joint, right patellofemoral compartment, left patellofemoral compartment and left medial femorotibial compartment consistent with osteoarthritic changes.  Normal physiologic activity  is identified within the kidneys and urinary bladder.  IMPRESSION: 1. There is a focal area of increased radiotracer uptake to the left of midline just inferior to the level of the orbit at the expected location of the articulation between the lateral mass of C1 and C2. These likely represent degenerative change. Correlation with plain radiographs is recommended.  2.  Otherwise no scintigraphic evidence of metastatic disease.   Electronically Signed   By: Kathreen Devoid   On: 06/30/2013 14:23   Other results: EKG: A. fib with heart rate is 74, paced at times.  Assessment & Plan by Problem: Acute respiratory failure likely due to congestive heart failure in the setting of chronic kidney disease stage III Patient started on Lasix 40 mg IV which will be continued as twice daily.  No 2-D echocardiogram available for comparison to determine what patient's ejection fraction is, as a result will request 2-D echocardiogram in the morning.  When necessary nebulizer therapy as needed.  Congestive heart failure Management as indicated above.  2-D echocardiogram pending to assess ejection fraction.  Patient not started on ACE/ARB as EF is unknown and patient has chronic kidney disease stage III.  Depending on patient's clinical course if patient does not improve, may consider cardiology evaluation.  Atrial fibrillation status post pacemaker on chronic anticoagulation Rate controlled.  INR subtherapeutic, patient's Coumadin has been held recently for supratherapeutic INR.  Resume  Coumadin as per pharmacy.  Hypertension Stable.  Continue antihypertensive medications.  Hyperlipidemia Continue statin.  Chronic kidney disease stage III Baseline renal function range from 1.7 to 3.4.  Renal function appears to be at baseline.  Monitor renal function carefully in the setting of diuresis.  Type 2 diabetes Continue home blood Glimepiride.  Sliding scale insulin.  Diabetic diet.  Coronary artery disease Stable.  Continue home atenolol.  Prophylaxis On Coumadin.  INR subtherapeutic.  CODE STATUS Full code.  Disposition Admit the patient to telemetry as inpatient.  Time spent on admission, talking to the patient, and coordinating care was: 60 mins.  Bynum Bellows, MD 07/24/2013, 4:14 AM

## 2013-07-24 NOTE — ED Notes (Signed)
Per EMS pt from home, laying on recliner and had sudden onset SOB, no relief with position change. Patient sts wheezing and everything went away on its own after a few min. Patient denies cough, no respiratory hx. No CP. Patient has diminished lower left lung sounds. Hx of resolved pneumonia 3 months ago. Patient has demand pacemaker being paced patient in a-fib

## 2013-07-24 NOTE — Progress Notes (Addendum)
[facility] PROGRESS NOTE Interim History: 80 y.o. Caucasian male with history of type 2 diabetes, hypertension, BPH, coronary artery disease, status post pacemaker, arthritis, chronic kidney disease stage III, A. fib on chronic anticoagulation, and prostate cancer presents with the above complaints. Patient reports that his symptoms started about 3 days ago when he started feeling short of breath. Initially his symptoms were limited to the wheezing which since then has progressed.   Filed Weights   07/24/13 0443  Weight: 107.502 kg (237 lb)        Intake/Output Summary (Last 24 hours) at 07/24/13 0809 Last data filed at 07/24/13 0034  Gross per 24 hour  Intake    220 ml  Output    725 ml  Net   -505 ml     Assessment/Plan: *Acute respiratory failure due to CHF Undetermine: - Started on IV Lasix on admission. +JVD, lower extremity edema. - Estimated dry weight is 104 kg. On admission 107 kg. - Strict I and O;'s daily weights. Daily weights, fluid restriction. - On beta blockers, lasix, not on and ACE-I probably due to renal function.  DM type 2 causing renal disease: - cont current dose medications.  Atrial fibrillation - rate controlled on amiodarone. - INR therapeutic. Coumadin per pharmacy.   Kidney disease, chronic, stage III (GFR 30-59 ml/min) - Unclear baseline. - previous was 2.7. - follow trends   Hyperlipidemia - cont statins.   Hypertensive heart disease: - Bp currently stable.  CAD (coronary artery disease) - CP free.    Code Status: full Family Communication: wife  Disposition Plan: inpatinet   Consultants:  none  Procedures: ECHO: pending  Antibiotics:  None  HPI/Subjective: Dry cough not able to lay flat  Objective: Filed Vitals:   07/24/13 0230 07/24/13 0245 07/24/13 0400 07/24/13 0443  BP: 113/55 119/71 117/59 114/46  Pulse:  71 61 75  Temp:    98.7 F (37.1 C)  TempSrc:    Oral  Resp:  23 15 18   Height:    6\' 1"   (1.854 m)  Weight:    107.502 kg (237 lb)  SpO2:  92% 93% 96%     Exam:  General: Alert, awake, oriented x3, in no acute distress.  HEENT: No bruits, no goiter. +JVD Heart: Regular rate and rhythm, lower ext edema. Lungs: Good air movement, bilateral crackles Abdomen: Soft, nontender, nondistended, positive bowel sounds.   Data Reviewed: Basic Metabolic Panel:  Recent Labs Lab 07/24/13 0120  NA 144  K 4.2  CL 106  CO2 25  GLUCOSE 121*  BUN 54*  CREATININE 2.79*  CALCIUM 9.0   Liver Function Tests: No results found for this basename: AST, ALT, ALKPHOS, BILITOT, PROT, ALBUMIN,  in the last 168 hours No results found for this basename: LIPASE, AMYLASE,  in the last 168 hours No results found for this basename: AMMONIA,  in the last 168 hours CBC:  Recent Labs Lab 07/24/13 0120  WBC 12.5*  NEUTROABS 10.5*  HGB 10.9*  HCT 34.8*  MCV 92.3  PLT 298   Cardiac Enzymes:  Recent Labs Lab 07/24/13 0120  TROPONINI <0.30   BNP (last 3 results)  Recent Labs  07/24/13 0120  PROBNP 9261.0*   CBG:  Recent Labs Lab 07/24/13 0637  GLUCAP 112*    No results found for this or any previous visit (from the past 240 hour(s)).   Studies: Dg Chest 2 View  07/24/2013   CLINICAL DATA:  Acute shortness of breath.  EXAM: CHEST  2 VIEW  COMPARISON:  07/20/2013.  FINDINGS: Stable enlarged cardiac silhouette and post CABG changes. Stable left subclavian pacemaker leads. Mild increase in linear density at both lung bases. Increased prominence of the interstitial markings. No pleural fluid. Thoracolumbar spine degenerative changes.  IMPRESSION: 1. Interval mild interstitial pulmonary edema. 2. Increased bibasilar atelectasis. 3. Stable cardiomegaly   Electronically Signed   By: Enrique Sack M.D.   On: 07/24/2013 02:48    Scheduled Meds: . amiodarone  200 mg Oral q morning - 10a  . amLODipine  10 mg Oral q morning - 10a  . atenolol  25 mg Oral q morning - 10a  . finasteride  5  mg Oral Daily  . folic acid  1 mg Oral Daily  . furosemide  40 mg Intravenous BID  . glimepiride  1 mg Oral QAC breakfast  . insulin aspart  0-5 Units Subcutaneous QHS  . insulin aspart  0-9 Units Subcutaneous TID WC  . multivitamin with minerals  1 tablet Oral Daily  . omega-3 acid ethyl esters  1,000 mg Oral Daily  . sodium chloride  3 mL Intravenous Q12H  . warfarin  4 mg Oral QPM  . Warfarin - Pharmacist Dosing Inpatient   Does not apply q1800   Continuous Infusions:    [facility] Pager [phone] If 8PM-8AM, please contact night-coverage at [email or web address], password [facility] 07/24/2013, 8:09 AM  LOS: 0 days

## 2013-07-24 NOTE — ED Provider Notes (Signed)
CSN: [id number]     Arrival date & time 07/24/13  0054 History   First MD Initiated Contact with Patient 07/24/13 0102     Chief Complaint  Patient presents with  . Shortness of Breath     (Consider location/radiation/quality/duration/timing/severity/associated sxs/prior Treatment) HPI 80 year old male presents to emergency department from home via EMS with complaint of shortness of breath.  Patient was sitting in a chair when he had acute onset of shortness of breath.  Patient and wife report he was making strange high-pitched noises, wheezing.  No prior history of same.  He denies history of CHF.  Patient had pneumonia about 3 months ago.  Patient reports he tried to get into different positions but could not breathe better.  Patient has history of atrophic fibrillation, is on Coumadin.  He has held his Coumadin for last 2 days due to high inr at last check earlier this week.  Patient has history of diabetes, hypertension, coronary disease, pacemaker, chronic kidney disease, recent diagnosis of prostate cancer.Marland Kitchen  He is followed by Dr. Wynonia Lawman.  Per care Dr. is Dr. Dagmar Hait  Past Medical History  Diagnosis Date  . Lumbar disc disease   . BPH (benign prostatic hyperplasia)   . HTN (hypertension)   . Diabetes mellitus type II   . Sinus node dysfunction     symptomatic bradycardia  . Pacemaker     Medtronic-dual-chamber-DOI 2012  . CAD (coronary artery disease)     followed by Dr. Wynonia Lawman  . Arthritis   . History of skin cancer   . Frequency of urination   . Nocturia   . Chronic kidney disease     kidneys function at "20 %" followed by Dr. Mercy Moore  . Cancer     history of skin cancer  . Dysrhythmia     a fib  . Prostate cancer   . Ruptured disk 1992  . Skin cancer    Past Surgical History  Procedure Laterality Date  . Coronary artery bypass graft  2003    x3  . Cardiac catheterization    . Lumbar laminectomy  11/01  . Cardioversion  08/08/2011    Procedure: CARDIOVERSION;   Surgeon: Jacolyn Reedy, MD;  Location: [place];  Service: Cardiovascular;  Laterality: N/A;  . Cardioversion N/A 07/16/2012    Procedure: CARDIOVERSION;  Surgeon: Jacolyn Reedy, MD;  Location: [place];  Service: Cardiovascular;  Laterality: N/A;  . Pacemaker insertion  2012  . Cataracts removed    . Cystoscopy with biopsy N/A 06/21/2013    Procedure: CYSTOSCOPY WITH BIOPSY;  Surgeon: Molli Hazard, MD;  Location: [facility];  Service: Urology;  Laterality: N/A;    TUR RESECTION OF POLYP BILATERAL RETROGRADE PYELOGRAM BLADDER BIOPSY    . Prostate biopsy N/A 06/21/2013    Procedure: BIOPSY TRANSRECTAL ULTRASONIC PROSTATE (TUBP);  Surgeon: Molli Hazard, MD;  Location: [facility];  Service: Urology;  Laterality: N/A;  PROSTATE NERVE BLOCK  . Appendectomy  1980   Family History  Problem Relation Age of Onset  . Cancer Mother     breast  . Cancer Father     prostate, esophagus   History  Substance Use Topics  . Smoking status: Former Smoker -- 0.75 packs/day    Types: Cigarettes    Quit date: 03/12/1968  . Smokeless tobacco: Never Used  . Alcohol Use: No    Review of Systems   See History of Present Illness; otherwise all other systems are reviewed and negative  Allergies  Crestor; Lipitor; Tramadol; Vicodin; and Penicillins  Home Medications   Prior to Admission medications   Medication Sig Start Date End Date Taking? Authorizing Provider  amiodarone (PACERONE) 200 MG tablet Take 200 mg by mouth every morning.   Yes Historical Provider, MD  amLODipine (NORVASC) 10 MG tablet Take 10 mg by mouth every morning.    Yes Historical Provider, MD  atenolol (TENORMIN) 25 MG tablet Take 25 mg by mouth every morning.    Yes Historical Provider, MD  finasteride (PROSCAR) 5 MG tablet Take 5 mg by mouth daily.     Yes Historical Provider, MD  folic acid (FOLVITE) 322 MCG tablet Take 400 mcg by mouth daily.     Yes Historical Provider, MD  furosemide (LASIX) 40 MG tablet  Take 40 mg by mouth 2 (two) times daily.   Yes Historical Provider, MD  glimepiride (AMARYL) 1 MG tablet Take 1 mg by mouth daily before breakfast.     Yes Historical Provider, MD  Glucosamine HCl 1500 MG TABS Take 1 tablet by mouth 2 (two) times daily.    Yes Historical Provider, MD  Horse Chestnut 300 MG CAPS Take 1 capsule by mouth daily.    Yes Historical Provider, MD  hyoscyamine (LEVSIN, ANASPAZ) 0.125 MG tablet Take 1 tablet (0.125 mg total) by mouth every 4 (four) hours as needed (bladder spasms). 06/21/13  Yes Sharyn Creamer, MD  Multiple Vitamin (MULTIVITAMIN WITH MINERALS) TABS tablet Take 1 tablet by mouth daily.   Yes Historical Provider, MD  Omega-3 Fatty Acids (FISH OIL) 1200 MG CAPS Take 1,200 mg by mouth daily.   Yes Historical Provider, MD  warfarin (COUMADIN) 4 MG tablet Take 4 mg by mouth every evening.   Yes Historical Provider, MD   BP 119/71  Pulse 71  Temp(Src) 98.3 F (36.8 C) (Oral)  Resp 23  Ht 5\' 8"  (1.727 m)  SpO2 92% Physical Exam  Nursing note and vitals reviewed. Constitutional: He is oriented to person, place, and time. He appears well-developed and well-nourished.  HENT:  Head: Normocephalic and atraumatic.  Nose: Nose normal.  Mouth/Throat: Oropharynx is clear and moist.  Eyes: Conjunctivae and EOM are normal. Pupils are equal, round, and reactive to light.  Neck: Normal range of motion. Neck supple. No JVD present. No tracheal deviation present. No thyromegaly present.  Cardiovascular: Normal rate, regular rhythm, normal heart sounds and intact distal pulses.  Exam reveals no gallop and no friction rub.   No murmur heard. Pulmonary/Chest: Effort normal. No stridor. No respiratory distress. He has no wheezes. He has rales. He exhibits no tenderness.  Abdominal: Soft. Bowel sounds are normal. He exhibits no distension and no mass. There is no tenderness. There is no rebound and no guarding.  Patient has trace pitting edema to the abdomen   Musculoskeletal: Normal range of motion. He exhibits edema (patient has asymmetric edema, left greater than right, which he reports his baseline.  He is 2+ pitting edema on the left, 1+ on the right.). He exhibits no tenderness.  Lymphadenopathy:    He has no cervical adenopathy.  Neurological: He is alert and oriented to person, place, and time. He exhibits normal muscle tone. Coordination normal.  Skin: Skin is warm and dry. No rash noted. No erythema. No pallor.  Psychiatric: He has a normal mood and affect. His behavior is normal. Judgment and thought content normal.    ED Course  Procedures (including critical care time) Labs Review Labs Reviewed  CBC  WITH DIFFERENTIAL - Abnormal; Notable for the following:    WBC 12.5 (*)    RBC 3.77 (*)    Hemoglobin 10.9 (*)    HCT 34.8 (*)    RDW 16.4 (*)    Neutrophils Relative % 84 (*)    Neutro Abs 10.5 (*)    Lymphocytes Relative 8 (*)    All other components within normal limits  PROTIME-INR - Abnormal; Notable for the following:    Prothrombin Time 21.5 (*)    INR 1.93 (*)    All other components within normal limits  BASIC METABOLIC PANEL - Abnormal; Notable for the following:    Glucose, Bld 121 (*)    BUN 54 (*)    Creatinine, Ser 2.79 (*)    GFR calc non Af Amer 19 (*)    GFR calc Af Amer 22 (*)    All other components within normal limits  PRO B NATRIURETIC PEPTIDE - Abnormal; Notable for the following:    Pro B Natriuretic peptide (BNP) 9261.0 (*)    All other components within normal limits  TROPONIN I    Imaging Review Dg Chest 2 View  07/24/2013   CLINICAL DATA:  Acute shortness of breath.  EXAM: CHEST  2 VIEW  COMPARISON:  07/20/2013.  FINDINGS: Stable enlarged cardiac silhouette and post CABG changes. Stable left subclavian pacemaker leads. Mild increase in linear density at both lung bases. Increased prominence of the interstitial markings. No pleural fluid. Thoracolumbar spine degenerative changes.  IMPRESSION:  1. Interval mild interstitial pulmonary edema. 2. Increased bibasilar atelectasis. 3. Stable cardiomegaly   Electronically Signed   By: Enrique Sack M.D.   On: 07/24/2013 02:48     EKG Interpretation None      Date: 07/24/2013  Rate: 74  Rhythm: atrial fibrillation  QRS Axis: normal  Intervals: normal  ST/T Wave abnormalities: normal  Conduction Disutrbances:right bundle branch block  Narrative Interpretation:   Old EKG Reviewed: unchanged    MDM   Final diagnoses:  CHF (congestive heart failure)    80 year old male with acute onset of short dose of breath, hypoxia which is new.  Exam shows increasing edema, rales on exam.  Concern for flash pulmonary edema, although his presentation is fairly mild.  Plan for chest x-ray, EKG, labs.  Expect he'll need admission given his new hypoxia.  Doubt PE as patient reports supratherapeutic Coumadin earlier this week.    Kalman Drape, MD 07/24/13 0700

## 2013-07-24 NOTE — Progress Notes (Signed)
ANTICOAGULATION CONSULT NOTE - Initial Consult  Pharmacy Consult for Coumadin Indication: atrial fibrillation  Allergies  Allergen Reactions  . Crestor [Rosuvastatin]     Aches in joint   . Lipitor [Atorvastatin]     Joint aching  . Tramadol Nausea Only  . Vicodin [Hydrocodone-Acetaminophen]     "Made me nervous"  . Penicillins Rash    Patient Measurements: Height: 5\' 8"  (172.7 cm) IBW/kg (Calculated) : 68.4  Vital Signs: Temp: 98.3 F (36.8 C) (05/16 0102) Temp src: Oral (05/16 0102) BP: 117/59 mmHg (05/16 0400) Pulse Rate: 61 (05/16 0400)  Labs:  Recent Labs  07/24/13 0120  HGB 10.9*  HCT 34.8*  PLT 298  LABPROT 21.5*  INR 1.93*  CREATININE 2.79*  TROPONINI <0.30    The CrCl is unknown because both a height and weight (above a minimum accepted value) are required for this calculation.   Medical History: Past Medical History  Diagnosis Date  . Lumbar disc disease   . BPH (benign prostatic hyperplasia)   . HTN (hypertension)   . Diabetes mellitus type II   . Sinus node dysfunction     symptomatic bradycardia  . Pacemaker     Medtronic-dual-chamber-DOI 2012  . CAD (coronary artery disease)     followed by Dr. Wynonia Lawman  . Arthritis   . History of skin cancer   . Frequency of urination   . Nocturia   . Chronic kidney disease     kidneys function at "20 %" followed by Dr. Mercy Moore  . Cancer     history of skin cancer  . Dysrhythmia     a fib  . Prostate cancer   . Ruptured disk 1992  . Skin cancer     Medications:  Prescriptions prior to admission  Medication Sig Dispense Refill  . amiodarone (PACERONE) 200 MG tablet Take 200 mg by mouth every morning.      Marland Kitchen amLODipine (NORVASC) 10 MG tablet Take 10 mg by mouth every morning.       Marland Kitchen atenolol (TENORMIN) 25 MG tablet Take 25 mg by mouth every morning.       . finasteride (PROSCAR) 5 MG tablet Take 5 mg by mouth daily.        . folic acid (FOLVITE) 937 MCG tablet Take 400 mcg by mouth daily.         . furosemide (LASIX) 40 MG tablet Take 40 mg by mouth 2 (two) times daily.      Marland Kitchen glimepiride (AMARYL) 1 MG tablet Take 1 mg by mouth daily before breakfast.        . Glucosamine HCl 1500 MG TABS Take 1 tablet by mouth 2 (two) times daily.       . Horse Chestnut 300 MG CAPS Take 1 capsule by mouth daily.       . hyoscyamine (LEVSIN, ANASPAZ) 0.125 MG tablet Take 1 tablet (0.125 mg total) by mouth every 4 (four) hours as needed (bladder spasms).  40 tablet  4  . Multiple Vitamin (MULTIVITAMIN WITH MINERALS) TABS tablet Take 1 tablet by mouth daily.      . Omega-3 Fatty Acids (FISH OIL) 1200 MG CAPS Take 1,200 mg by mouth daily.      Marland Kitchen warfarin (COUMADIN) 4 MG tablet Take 4 mg by mouth every evening.        Assessment: 80 yo male admitted with shortness of breath, h/o Afib, to continue Coumadin  Goal of Therapy:  INR 2-3 Monitor platelets by anticoagulation protocol:  Yes   Plan:  Continue home regimen for now Daily INR  Bronson Curb Hagan Maltz 07/24/2013,4:32 AM

## 2013-07-24 NOTE — ED Notes (Signed)
hospitalist at bedside to eval pt

## 2013-07-24 NOTE — Progress Notes (Signed)
Patient arrived to unit from ED via stretcher. Patient alert, oriented and ambulatory with stand by assist. Admission weight, vitals and assessment completed. Fall and safety plan reviewed with patient. Patient currently resting comfortably, call light within reach. Will continue to monitor. Blood pressure 114/46, pulse 75, temperature 98.7 F (37.1 C), temperature source Oral, resp. rate 18, height 6\' 1"  (1.854 m), weight 107.502 kg (237 lb), SpO2 96.00%. Tresa Endo

## 2013-07-25 DIAGNOSIS — R0602 Shortness of breath: Secondary | ICD-10-CM

## 2013-07-25 DIAGNOSIS — E785 Hyperlipidemia, unspecified: Secondary | ICD-10-CM

## 2013-07-25 LAB — BASIC METABOLIC PANEL
BUN: 59 mg/dL — ABNORMAL HIGH (ref 6–23)
CHLORIDE: 104 meq/L (ref 96–112)
CO2: 26 meq/L (ref 19–32)
Calcium: 8.9 mg/dL (ref 8.4–10.5)
Creatinine, Ser: 2.98 mg/dL — ABNORMAL HIGH (ref 0.50–1.35)
GFR calc Af Amer: 21 mL/min — ABNORMAL LOW (ref >90)
GFR calc non Af Amer: 18 mL/min — ABNORMAL LOW (ref >90)
Glucose, Bld: 93 mg/dL (ref 70–99)
Potassium: 3.9 mEq/L (ref 3.7–5.3)
Sodium: 143 mEq/L (ref 137–147)

## 2013-07-25 LAB — GLUCOSE, CAPILLARY
GLUCOSE-CAPILLARY: 110 mg/dL — AB (ref 70–99)
Glucose-Capillary: 94 mg/dL (ref 70–99)
Glucose-Capillary: 179 mg/dL — ABNORMAL HIGH (ref 70–99)
Glucose-Capillary: 96 mg/dL (ref 70–99)

## 2013-07-25 LAB — PROTIME-INR
INR: 1.57 — ABNORMAL HIGH (ref 0.00–1.49)
PROTHROMBIN TIME: 18.3 s — AB (ref 11.6–15.2)

## 2013-07-25 MED ORDER — WARFARIN SODIUM 7.5 MG PO TABS
7.5000 mg | ORAL_TABLET | Freq: Once | ORAL | Status: AC
  Administered 2013-07-25: 7.5 mg via ORAL
  Filled 2013-07-25 (×2): qty 1

## 2013-07-25 NOTE — Progress Notes (Signed)
ANTICOAGULATION CONSULT NOTE - Initial Consult  Pharmacy Consult for Coumadin Indication: atrial fibrillation  Allergies  Allergen Reactions  . Crestor [Rosuvastatin]     Aches in joint   . Lipitor [Atorvastatin]     Joint aching  . Tramadol Nausea Only  . Vicodin [Hydrocodone-Acetaminophen]     "Made me nervous"  . Penicillins Rash    Patient Measurements: Height: 6\' 1"  (185.4 cm) Weight: 233 lb (105.688 kg) (scale c) IBW/kg (Calculated) : 79.9  Vital Signs: Temp: 98.3 F (36.8 C) (05/17 0440) Temp src: Oral (05/17 0440) BP: 104/62 mmHg (05/17 0440) Pulse Rate: 63 (05/17 0440)  Labs:  Recent Labs  07/24/13 0120 07/24/13 0902 07/24/13 1636 07/25/13 0440  HGB 10.9* 10.1*  --   --   HCT 34.8* 32.1*  --   --   PLT 298 282  --   --   LABPROT 21.5* 19.3*  --  18.3*  INR 1.93* 1.68*  --  1.57*  CREATININE 2.79*  --   --  2.98*  TROPONINI <0.30 <0.30 <0.30  --     Estimated Creatinine Clearance: 23.5 ml/min (by C-G formula based on Cr of 2.98).   Medical History: Past Medical History  Diagnosis Date  . Lumbar disc disease   . BPH (benign prostatic hyperplasia)   . HTN (hypertension)   . Diabetes mellitus type II   . Sinus node dysfunction     symptomatic bradycardia  . Pacemaker     Medtronic-dual-chamber-DOI 2012  . CAD (coronary artery disease)     followed by Dr. Wynonia Lawman  . Arthritis   . History of skin cancer   . Frequency of urination   . Nocturia   . Chronic kidney disease     kidneys function at "20 %" followed by Dr. Mercy Moore  . Cancer     history of skin cancer  . Dysrhythmia     a fib  . Prostate cancer   . Ruptured disk 1992  . Skin cancer     Medications:  Prescriptions prior to admission  Medication Sig Dispense Refill  . amiodarone (PACERONE) 200 MG tablet Take 200 mg by mouth every morning.      Marland Kitchen amLODipine (NORVASC) 10 MG tablet Take 10 mg by mouth every morning.       Marland Kitchen atenolol (TENORMIN) 25 MG tablet Take 25 mg by mouth  every morning.       . finasteride (PROSCAR) 5 MG tablet Take 5 mg by mouth daily.        . folic acid (FOLVITE) 778 MCG tablet Take 400 mcg by mouth daily.        . furosemide (LASIX) 40 MG tablet Take 40 mg by mouth 2 (two) times daily.      Marland Kitchen glimepiride (AMARYL) 1 MG tablet Take 1 mg by mouth daily before breakfast.        . Glucosamine HCl 1500 MG TABS Take 1 tablet by mouth 2 (two) times daily.       . Horse Chestnut 300 MG CAPS Take 1 capsule by mouth daily.       . hyoscyamine (LEVSIN, ANASPAZ) 0.125 MG tablet Take 1 tablet (0.125 mg total) by mouth every 4 (four) hours as needed (bladder spasms).  40 tablet  4  . Multiple Vitamin (MULTIVITAMIN WITH MINERALS) TABS tablet Take 1 tablet by mouth daily.      . Omega-3 Fatty Acids (FISH OIL) 1200 MG CAPS Take 1,200 mg by mouth daily.      Marland Kitchen  warfarin (COUMADIN) 4 MG tablet Take 4 mg by mouth every evening.        Assessment: 29 yoM presents 5/16 with SOB, no relief with position change. Hx resolved PNA 3 months ago. CXR showed pulmonary edema consistent with CHF. Pharmacy consulted to dose coumadin.    Warfarin PTA 4mg  daily, was resumed on admission. INR this AM 1.57  Hgb 10.1, Plts 282. No bleeding noted.   Goal of Therapy:  INR 2-3 Monitor platelets by anticoagulation protocol: Yes   Plan:  Warfarin 7.5mg  x 1 tonight Daily INR, monitor s/s bleeding   Ollen Gross B. Leitha Schuller, PharmD Clinical Pharmacist - Resident Phone: [phone] Pager: [phone] 07/25/2013 8:13 AM

## 2013-07-25 NOTE — Progress Notes (Signed)
[facility] PROGRESS NOTE Interim History: 80 y.o. Caucasian male with history of type 2 diabetes, hypertension, BPH, coronary artery disease, status post pacemaker, arthritis, chronic kidney disease stage III, A. fib on chronic anticoagulation, and prostate cancer presents with the above complaints. Patient reports that his symptoms started about 3 days ago when he started feeling short of breath. Initially his symptoms were limited to the wheezing which since then has progressed.   Filed Weights   07/24/13 0443 07/25/13 0440  Weight: 107.502 kg (237 lb) 105.688 kg (233 lb)        Intake/Output Summary (Last 24 hours) at 07/25/13 0931 Last data filed at 07/25/13 0800  Gross per 24 hour  Intake   1116 ml  Output   3225 ml  Net  -2109 ml     Assessment/Plan: *Acute respiratory failure due to CHF Undetermine: - Cont IV Lasix on admission. +JVD. - Estimated dry weight is 104 kg. On admission 107 ->105.6 kg. - Strict I and O;'s daily weights. Daily weights, fluid restriction. - On beta blockers, lasix, not on and ACE-I probably due to renal function. - d/c Norvasc.  DM type 2 causing renal disease: - cont current dose medications.  Atrial fibrillation - rate controlled on amiodarone. - INR therapeutic. Coumadin per pharmacy.   Kidney disease, chronic, stage III (GFR 30-59 ml/min) - Unclear baseline. - previous was 2.7. - follow trends   Hyperlipidemia - cont statins.   Hypertensive heart disease: - Bp currently stable.  CAD (coronary artery disease) - CP free.    Code Status: full Family Communication: wife  Disposition Plan: inpatinet   Consultants:  none  Procedures: ECHO: pending  Antibiotics:  None  HPI/Subjective: Dry cough not able to lay flat  Objective: Filed Vitals:   07/24/13 1400 07/24/13 2054 07/25/13 0440 07/25/13 0851  BP: 105/59 101/52 104/62 96/58  Pulse: 63 65 63 67  Temp: 98.7 F (37.1 C) 98.8 F (37.1 C) 98.3 F  (36.8 C)   TempSrc: Oral Oral Oral   Resp: 18 18 18    Height:      Weight:   105.688 kg (233 lb)   SpO2: 93% 95% 94%      Exam:  General: Alert, awake, oriented x3, in no acute distress.  HEENT: No bruits, no goiter. +JVD Heart: Regular rate and rhythm. Lungs: Good air movement, bilateral crackles Abdomen: Soft, nontender, nondistended, positive bowel sounds.   Data Reviewed: Basic Metabolic Panel:  Recent Labs Lab 07/24/13 0120 07/25/13 0440  NA 144 143  K 4.2 3.9  CL 106 104  CO2 25 26  GLUCOSE 121* 93  BUN 54* 59*  CREATININE 2.79* 2.98*  CALCIUM 9.0 8.9   Liver Function Tests: No results found for this basename: AST, ALT, ALKPHOS, BILITOT, PROT, ALBUMIN,  in the last 168 hours No results found for this basename: LIPASE, AMYLASE,  in the last 168 hours No results found for this basename: AMMONIA,  in the last 168 hours CBC:  Recent Labs Lab 07/24/13 0120 07/24/13 0902  WBC 12.5* 9.0  NEUTROABS 10.5*  --   HGB 10.9* 10.1*  HCT 34.8* 32.1*  MCV 92.3 91.2  PLT 298 282   Cardiac Enzymes:  Recent Labs Lab 07/24/13 0120 07/24/13 0902 07/24/13 1636  TROPONINI <0.30 <0.30 <0.30   BNP (last 3 results)  Recent Labs  07/24/13 0120  PROBNP 9261.0*   CBG:  Recent Labs Lab 07/24/13 0637 07/24/13 1642 07/24/13 2146 07/25/13 0659  GLUCAP 112* 143*  100* 94    No results found for this or any previous visit (from the past 240 hour(s)).   Studies: Dg Chest 2 View  07/24/2013   CLINICAL DATA:  Acute shortness of breath.  EXAM: CHEST  2 VIEW  COMPARISON:  07/20/2013.  FINDINGS: Stable enlarged cardiac silhouette and post CABG changes. Stable left subclavian pacemaker leads. Mild increase in linear density at both lung bases. Increased prominence of the interstitial markings. No pleural fluid. Thoracolumbar spine degenerative changes.  IMPRESSION: 1. Interval mild interstitial pulmonary edema. 2. Increased bibasilar atelectasis. 3. Stable cardiomegaly    Electronically Signed   By: Enrique Sack M.D.   On: 07/24/2013 02:48    Scheduled Meds: . amiodarone  200 mg Oral q morning - 10a  . atenolol  25 mg Oral q morning - 10a  . finasteride  5 mg Oral Daily  . folic acid  1 mg Oral Daily  . furosemide  80 mg Intravenous BID  . glimepiride  1 mg Oral QAC breakfast  . insulin aspart  0-5 Units Subcutaneous QHS  . insulin aspart  0-9 Units Subcutaneous TID WC  . multivitamin with minerals  1 tablet Oral Daily  . omega-3 acid ethyl esters  1,000 mg Oral Daily  . sodium chloride  3 mL Intravenous Q12H  . warfarin  7.5 mg Oral ONCE-1800  . Warfarin - Pharmacist Dosing Inpatient   Does not apply q1800   Continuous Infusions:    [facility] Pager [phone] If 8PM-8AM, please contact night-coverage at [email or web address], password [facility] 07/25/2013, 9:31 AM  LOS: 1 day

## 2013-07-25 NOTE — Discharge Instructions (Signed)
Information on my medicine - Coumadin   (Warfarin)  This medication education was reviewed with me or my healthcare representative as part of my discharge preparation.  The pharmacist that spoke with me during my hospital stay was:  Burna Cash, [facility]  Why was Coumadin prescribed for you? Coumadin was prescribed for you because you have a blood clot or a medical condition that can cause an increased risk of forming blood clots. Blood clots can cause serious health problems by blocking the flow of blood to the heart, lung, or brain. Coumadin can prevent harmful blood clots from forming. As a reminder your indication for Coumadin is:   Stroke Prevention Because Of Atrial Fibrillation  What test will check on my response to Coumadin? While on Coumadin (warfarin) you will need to have an INR test regularly to ensure that your dose is keeping you in the desired range. The INR (international normalized ratio) number is calculated from the result of the laboratory test called prothrombin time (PT).  If an INR APPOINTMENT HAS NOT ALREADY BEEN MADE FOR YOU please schedule an appointment to have this lab work done by your health care provider within 7 days. Your INR goal is usually a number between:  2 to 3 or your provider may give you a more narrow range like 2-2.5.  Ask your health care provider during an office visit what your goal INR is.  What  do you need to  know  About  COUMADIN? Take Coumadin (warfarin) exactly as prescribed by your healthcare provider about the same time each day.  DO NOT stop taking without talking to the doctor who prescribed the medication.  Stopping without other blood clot prevention medication to take the place of Coumadin may increase your risk of developing a new clot or stroke.  Get refills before you run out.  What do you do if you miss a dose? If you miss a dose, take it as soon as you remember on the same day then continue your regularly scheduled regimen the next day.   Do not take two doses of Coumadin at the same time.  Important Safety Information A possible side effect of Coumadin (Warfarin) is an increased risk of bleeding. You should call your healthcare provider right away if you experience any of the following:   Bleeding from an injury or your nose that does not stop.   Unusual colored urine (red or dark brown) or unusual colored stools (red or black).   Unusual bruising for unknown reasons.   A serious fall or if you hit your head (even if there is no bleeding).  Some foods or medicines interact with Coumadin (warfarin) and might alter your response to warfarin. To help avoid this:   Eat a balanced diet, maintaining a consistent amount of Vitamin K.   Notify your provider about major diet changes you plan to make.   Avoid alcohol or limit your intake to 1 drink for women and 2 drinks for men per day. (1 drink is 5 oz. wine, 12 oz. beer, or 1.5 oz. liquor.)  Make sure that ANY health care provider who prescribes medication for you knows that you are taking Coumadin (warfarin).  Also make sure the healthcare provider who is monitoring your Coumadin knows when you have started a new medication including herbals and non-prescription products.  Coumadin (Warfarin)  Major Drug Interactions  Increased Warfarin Effect Decreased Warfarin Effect  Alcohol (large quantities) Antibiotics (esp. Septra/Bactrim, Flagyl, Cipro) Amiodarone (Cordarone) Aspirin (  ASA) Cimetidine (Tagamet) Megestrol (Megace) NSAIDs (ibuprofen, naproxen, etc.) Piroxicam (Feldene) Propafenone (Rythmol SR) Propranolol (Inderal) Isoniazid (INH) Posaconazole (Noxafil) Barbiturates (Phenobarbital) Carbamazepine (Tegretol) Chlordiazepoxide (Librium) Cholestyramine (Questran) Griseofulvin Oral Contraceptives Rifampin Sucralfate (Carafate) Vitamin K   Coumadin (Warfarin) Major Herbal Interactions  Increased Warfarin Effect Decreased Warfarin Effect  Garlic Ginseng Ginkgo  biloba Coenzyme Q10 Green tea St. Johns wort    Coumadin (Warfarin) FOOD Interactions  Eat a consistent number of servings per week of foods HIGH in Vitamin K (1 serving =  cup)  Collards (cooked, or boiled & drained) Kale (cooked, or boiled & drained) Mustard greens (cooked, or boiled & drained) Parsley *serving size only =  cup Spinach (cooked, or boiled & drained) Swiss chard (cooked, or boiled & drained) Turnip greens (cooked, or boiled & drained)  Eat a consistent number of servings per week of foods MEDIUM-HIGH in Vitamin K (1 serving = 1 cup)  Asparagus (cooked, or boiled & drained) Broccoli (cooked, boiled & drained, or raw & chopped) Brussel sprouts (cooked, or boiled & drained) *serving size only =  cup Lettuce, raw (green leaf, endive, romaine) Spinach, raw Turnip greens, raw & chopped   These websites have more information on Coumadin (warfarin):  FailFactory.se; VeganReport.com.au;

## 2013-07-26 LAB — BASIC METABOLIC PANEL
BUN: 63 mg/dL — AB (ref 6–23)
CHLORIDE: 102 meq/L (ref 96–112)
CO2: 26 meq/L (ref 19–32)
CREATININE: 3 mg/dL — AB (ref 0.50–1.35)
Calcium: 8.7 mg/dL (ref 8.4–10.5)
GFR calc Af Amer: 21 mL/min — ABNORMAL LOW (ref >90)
GFR calc non Af Amer: 18 mL/min — ABNORMAL LOW (ref >90)
Glucose, Bld: 86 mg/dL (ref 70–99)
Potassium: 3.7 mEq/L (ref 3.7–5.3)
Sodium: 143 mEq/L (ref 137–147)

## 2013-07-26 LAB — GLUCOSE, CAPILLARY
GLUCOSE-CAPILLARY: 117 mg/dL — AB (ref 70–99)
GLUCOSE-CAPILLARY: 182 mg/dL — AB (ref 70–99)
Glucose-Capillary: 98 mg/dL (ref 70–99)
Glucose-Capillary: 135 mg/dL — ABNORMAL HIGH (ref 70–99)

## 2013-07-26 LAB — PROTIME-INR
INR: 1.45 (ref 0.00–1.49)
Prothrombin Time: 17.3 seconds — ABNORMAL HIGH (ref 11.6–15.2)

## 2013-07-26 MED ORDER — WARFARIN SODIUM 7.5 MG PO TABS
7.5000 mg | ORAL_TABLET | Freq: Once | ORAL | Status: AC
  Administered 2013-07-26: 7.5 mg via ORAL
  Filled 2013-07-26: qty 1

## 2013-07-26 NOTE — Progress Notes (Signed)
[facility] PROGRESS NOTE Interim History: 80 y.o. Caucasian male with history of type 2 diabetes, hypertension, BPH, coronary artery disease, status post pacemaker, arthritis, chronic kidney disease stage III, A. fib on chronic anticoagulation, and prostate cancer presents with the above complaints. Patient reports that his symptoms started about 3 days ago when he started feeling short of breath. Initially his symptoms were limited to the wheezing which since then has progressed.   Filed Weights   07/24/13 0443 07/25/13 0440 07/26/13 0539  Weight: 107.502 kg (237 lb) 105.688 kg (233 lb) 103.5 kg (228 lb 2.8 oz)        Intake/Output Summary (Last 24 hours) at 07/26/13 2979 Last data filed at 07/26/13 0700  Gross per 24 hour  Intake    840 ml  Output   3052 ml  Net  -2212 ml     Assessment/Plan: *Acute respiratory failure due to CHF Undetermine: - Cont IV Lasix on admission. +JVD. - Estimated dry weight is 104 kg. On admission 107 ->105.6 ->103.5 kg. - Strict I and O;'s daily weights. Daily weights, fluid restriction. - On beta blockers, lasix, not on and ACE-I probably due to renal function. - d/c Norvasc.  DM type 2 causing renal disease: - cont current dose medications.  Atrial fibrillation - rate controlled on amiodarone. - INR therapeutic. Coumadin per pharmacy.   Kidney disease, chronic, stage III (GFR 30-59 ml/min) - Unclear baseline. - previous was 2.7. - follow trends   Hyperlipidemia - cont statins.   Hypertensive heart disease: - Bp currently stable.  CAD (coronary artery disease) - CP free.    Code Status: full Family Communication: wife  Disposition Plan: inpatinet   Consultants:  none  Procedures: ECHO: pending  Antibiotics:  None  HPI/Subjective: Able to lay flat, cough resolved.  Objective: Filed Vitals:   07/25/13 1420 07/25/13 1841 07/25/13 2245 07/26/13 0539  BP: 108/67 110/63 102/59 99/62  Pulse: 60 65 68 64    Temp: 98.6 F (37 C)  98.4 F (36.9 C) 98.1 F (36.7 C)  TempSrc: Oral  Oral Oral  Resp: 18  18 19   Height:      Weight:    103.5 kg (228 lb 2.8 oz)  SpO2: 94%  92% 98%     Exam:  General: Alert, awake, oriented x3, in no acute distress.  HEENT: No bruits, no goiter. +JVD Heart: Regular rate and rhythm. Lungs: Good air movement, clear Abdomen: Soft, nontender, nondistended, positive bowel sounds.   Data Reviewed: Basic Metabolic Panel:  Recent Labs Lab 07/24/13 0120 07/25/13 0440 07/26/13 0445  NA 144 143 143  K 4.2 3.9 3.7  CL 106 104 102  CO2 25 26 26   GLUCOSE 121* 93 86  BUN 54* 59* 63*  CREATININE 2.79* 2.98* 3.00*  CALCIUM 9.0 8.9 8.7   Liver Function Tests: No results found for this basename: AST, ALT, ALKPHOS, BILITOT, PROT, ALBUMIN,  in the last 168 hours No results found for this basename: LIPASE, AMYLASE,  in the last 168 hours No results found for this basename: AMMONIA,  in the last 168 hours CBC:  Recent Labs Lab 07/24/13 0120 07/24/13 0902  WBC 12.5* 9.0  NEUTROABS 10.5*  --   HGB 10.9* 10.1*  HCT 34.8* 32.1*  MCV 92.3 91.2  PLT 298 282   Cardiac Enzymes:  Recent Labs Lab 07/24/13 0120 07/24/13 0902 07/24/13 1636  TROPONINI <0.30 <0.30 <0.30   BNP (last 3 results)  Recent Labs  07/24/13 0120  PROBNP 9261.0*   CBG:  Recent Labs Lab 07/25/13 0659 07/25/13 1116 07/25/13 1603 07/25/13 2212 07/26/13 0635  GLUCAP 94 110* 179* 96 98    No results found for this or any previous visit (from the past 240 hour(s)).   Studies: No results found.  Scheduled Meds: . amiodarone  200 mg Oral q morning - 10a  . atenolol  25 mg Oral q morning - 10a  . finasteride  5 mg Oral Daily  . folic acid  1 mg Oral Daily  . furosemide  80 mg Intravenous BID  . glimepiride  1 mg Oral QAC breakfast  . insulin aspart  0-5 Units Subcutaneous QHS  . insulin aspart  0-9 Units Subcutaneous TID WC  . multivitamin with minerals  1 tablet Oral  Daily  . omega-3 acid ethyl esters  1,000 mg Oral Daily  . sodium chloride  3 mL Intravenous Q12H  . Warfarin - Pharmacist Dosing Inpatient   Does not apply q1800   Continuous Infusions:    [facility] Pager [phone] If 8PM-8AM, please contact night-coverage at [email or web address], password [facility] 07/26/2013, 8:03 AM  LOS: 2 days

## 2013-07-26 NOTE — Progress Notes (Signed)
Patient ambulated in hallway on room air. Patient oxygen saturations while ambulating >90%. Patient had no complaints while ambulating.

## 2013-07-26 NOTE — Progress Notes (Signed)
[facility] for Coumadin Indication: atrial fibrillation  Allergies  Allergen Reactions  . Crestor [Rosuvastatin]     Aches in joint   . Lipitor [Atorvastatin]     Joint aching  . Tramadol Nausea Only  . Vicodin [Hydrocodone-Acetaminophen]     "Made me nervous"  . Penicillins Rash    Patient Measurements: Height: 6\' 1"  (185.4 cm) Weight: 228 lb 2.8 oz (103.5 kg) IBW/kg (Calculated) : 79.9  Vital Signs: Temp: 98.1 F (36.7 C) (05/18 0539) Temp src: Oral (05/18 0539) BP: 99/62 mmHg (05/18 0539) Pulse Rate: 64 (05/18 0539)  Labs:  Recent Labs  07/24/13 0120 07/24/13 0902 07/24/13 1636 07/25/13 0440 07/26/13 0445  HGB 10.9* 10.1*  --   --   --   HCT 34.8* 32.1*  --   --   --   PLT 298 282  --   --   --   LABPROT 21.5* 19.3*  --  18.3* 17.3*  INR 1.93* 1.68*  --  1.57* 1.45  CREATININE 2.79*  --   --  2.98* 3.00*  TROPONINI <0.30 <0.30 <0.30  --   --     Estimated Creatinine Clearance: 23.2 ml/min (by C-G formula based on Cr of 3).   Medical History: Past Medical History  Diagnosis Date  . Lumbar disc disease   . BPH (benign prostatic hyperplasia)   . HTN (hypertension)   . Diabetes mellitus type II   . Sinus node dysfunction     symptomatic bradycardia  . Pacemaker     Medtronic-dual-chamber-DOI 2012  . CAD (coronary artery disease)     followed by Dr. Wynonia Lawman  . Arthritis   . History of skin cancer   . Frequency of urination   . Nocturia   . Chronic kidney disease     kidneys function at "20 %" followed by Dr. Mercy Moore  . Cancer     history of skin cancer  . Dysrhythmia     a fib  . Prostate cancer   . Ruptured disk 1992  . Skin cancer     Medications:  Prescriptions prior to admission  Medication Sig Dispense Refill  . amiodarone (PACERONE) 200 MG tablet Take 200 mg by mouth every morning.      Marland Kitchen amLODipine (NORVASC) 10 MG tablet Take 10 mg by mouth every morning.       Marland Kitchen atenolol (TENORMIN) 25 MG  tablet Take 25 mg by mouth every morning.       . finasteride (PROSCAR) 5 MG tablet Take 5 mg by mouth daily.        . folic acid (FOLVITE) 811 MCG tablet Take 400 mcg by mouth daily.        . furosemide (LASIX) 40 MG tablet Take 40 mg by mouth 2 (two) times daily.      Marland Kitchen glimepiride (AMARYL) 1 MG tablet Take 1 mg by mouth daily before breakfast.        . Glucosamine HCl 1500 MG TABS Take 1 tablet by mouth 2 (two) times daily.       . Horse Chestnut 300 MG CAPS Take 1 capsule by mouth daily.       . hyoscyamine (LEVSIN, ANASPAZ) 0.125 MG tablet Take 1 tablet (0.125 mg total) by mouth every 4 (four) hours as needed (bladder spasms).  40 tablet  4  . Multiple Vitamin (MULTIVITAMIN WITH MINERALS) TABS tablet Take 1 tablet by mouth daily.      . Omega-3  Fatty Acids (FISH OIL) 1200 MG CAPS Take 1,200 mg by mouth daily.      Marland Kitchen warfarin (COUMADIN) 4 MG tablet Take 4 mg by mouth every evening.        Assessment: 70 yoM presents 5/16 with SOB, no relief with position change. Hx resolved PNA 3 months ago. CXR showed pulmonary edema consistent with CHF. Pharmacy consulted to dose coumadin.    Warfarin PTA 4mg  daily, was resumed on admission. INR this AM 1.45.  No bleeding noted  Goal of Therapy:  INR 2-3 Monitor platelets by anticoagulation protocol: Yes   Plan:  Warfarin 7.5mg  x 1 tonight Daily INR, monitor s/s bleeding   Excell Seltzer, PharmD Clinical Pharmacist Phone: [phone] Pager: [phone] 07/26/2013 8:14 AM

## 2013-07-26 NOTE — Progress Notes (Signed)
Telemetry discontinued per order. CCMD notified.  

## 2013-07-27 LAB — BASIC METABOLIC PANEL
BUN: 65 mg/dL — ABNORMAL HIGH (ref 6–23)
CO2: 28 mEq/L (ref 19–32)
Calcium: 8.9 mg/dL (ref 8.4–10.5)
Chloride: 103 mEq/L (ref 96–112)
Creatinine, Ser: 3.19 mg/dL — ABNORMAL HIGH (ref 0.50–1.35)
GFR, EST AFRICAN AMERICAN: 19 mL/min — AB (ref >90)
GFR, EST NON AFRICAN AMERICAN: 16 mL/min — AB (ref >90)
Glucose, Bld: 106 mg/dL — ABNORMAL HIGH (ref 70–99)
POTASSIUM: 4.2 meq/L (ref 3.7–5.3)
SODIUM: 144 meq/L (ref 137–147)

## 2013-07-27 LAB — PROTIME-INR
INR: 1.83 — AB (ref 0.00–1.49)
Prothrombin Time: 20.6 seconds — ABNORMAL HIGH (ref 11.6–15.2)

## 2013-07-27 LAB — GLUCOSE, CAPILLARY
GLUCOSE-CAPILLARY: 100 mg/dL — AB (ref 70–99)
GLUCOSE-CAPILLARY: 162 mg/dL — AB (ref 70–99)
GLUCOSE-CAPILLARY: 129 mg/dL — AB (ref 70–99)
Glucose-Capillary: 112 mg/dL — ABNORMAL HIGH (ref 70–99)

## 2013-07-27 MED ORDER — FUROSEMIDE 40 MG PO TABS
60.0000 mg | ORAL_TABLET | Freq: Two times a day (BID) | ORAL | Status: DC
  Administered 2013-07-28: 60 mg via ORAL
  Filled 2013-07-27 (×3): qty 1

## 2013-07-27 MED ORDER — FUROSEMIDE 10 MG/ML IJ SOLN: 80.0000 mg | Freq: Three times a day (TID) | INTRAMUSCULAR | Status: DC

## 2013-07-27 MED ORDER — FUROSEMIDE 40 MG PO TABS
60.0000 mg | ORAL_TABLET | Freq: Two times a day (BID) | ORAL | Status: DC
  Filled 2013-07-27: qty 1

## 2013-07-27 MED ORDER — FUROSEMIDE 10 MG/ML IJ SOLN
80.0000 mg | Freq: Three times a day (TID) | INTRAMUSCULAR | Status: DC
  Administered 2013-07-27: 80 mg via INTRAVENOUS

## 2013-07-27 MED ORDER — FUROSEMIDE 40 MG PO TABS: 60.0000 mg | ORAL_TABLET | Freq: Two times a day (BID) | ORAL | Status: DC

## 2013-07-27 MED ORDER — FUROSEMIDE 10 MG/ML IJ SOLN
80.0000 mg | Freq: Once | INTRAMUSCULAR | Status: AC
  Administered 2013-07-27: 80 mg via INTRAVENOUS

## 2013-07-27 MED ORDER — WARFARIN SODIUM 4 MG PO TABS
4.0000 mg | ORAL_TABLET | Freq: Once | ORAL | Status: AC
  Administered 2013-07-27: 4 mg via ORAL
  Filled 2013-07-27: qty 1

## 2013-07-27 NOTE — Progress Notes (Signed)
Patient has post hospital appointment on Wednesday May 27th at [place] per Juliann Pulse at Dr. Thurman Coyer office.

## 2013-07-27 NOTE — Care Management Note (Addendum)
  Page 1 of 1   07/27/2013     4:28:43 PM CARE MANAGEMENT NOTE 07/27/2013  Patient:  Christopher [name], Christopher [name]   Account Number:  [id number]  Date Initiated:  07/27/2013  Documentation initiated by:  Mariann Laster  Subjective/Objective Assessment:   Admitted with SOB     Action/Plan:   CM to follow for dispositon needs   Anticipated DC Date:  07/29/2013   Anticipated DC Plan:  HOME/SELF CARE         Choice offered to / List presented to:             Status of service:  Completed, signed off Medicare Important Message given?  YES (If response is "NO", the following Medicare IM given date fields will be blank) Date Medicare IM given:  07/24/2013 Date Additional Medicare IM given:  07/27/2013  Discharge Disposition:  HOME/SELF CARE  Per UR Regulation:  Reviewed for med. necessity/level of care/duration of stay  If discussed at [place] of Stay Meetings, dates discussed:    Comments:  Jaima Janney RN, BSN, MSHL, CCM  Nurse - Case Manager, ([facility] [place])  [phone]  07/27/2013 Social:  From home with wife. Home DME:  cane, w/c Disposition:  Home/Self Care.

## 2013-07-27 NOTE — Progress Notes (Signed)
Pt a/o,  No c/o pain, pt on IV lasix, diuresing well, vss, pt stable

## 2013-07-27 NOTE — Progress Notes (Signed)
[facility] for Coumadin Indication: atrial fibrillation  Allergies  Allergen Reactions  . Crestor [Rosuvastatin]     Aches in joint   . Lipitor [Atorvastatin]     Joint aching  . Tramadol Nausea Only  . Vicodin [Hydrocodone-Acetaminophen]     "Made me nervous"  . Penicillins Rash    Patient Measurements: Height: 6\' 1"  (185.4 cm) Weight: 226 lb 3.1 oz (102.6 kg) IBW/kg (Calculated) : 79.9  Vital Signs: Temp: 97.5 F (36.4 C) (05/19 0648) Temp src: Oral (05/19 0648) BP: 102/59 mmHg (05/19 0950) Pulse Rate: 58 (05/19 0950)  Labs:  Recent Labs  07/24/13 1636 07/25/13 0440 07/26/13 0445 07/27/13 0518  LABPROT  --  18.3* 17.3* 20.6*  INR  --  1.57* 1.45 1.83*  CREATININE  --  2.98* 3.00* 3.19*  TROPONINI <0.30  --   --   --     Estimated Creatinine Clearance: 21.7 ml/min (by C-G formula based on Cr of 3.19).   Medical History: Past Medical History  Diagnosis Date  . Lumbar disc disease   . BPH (benign prostatic hyperplasia)   . HTN (hypertension)   . Diabetes mellitus type II   . Sinus node dysfunction     symptomatic bradycardia  . Pacemaker     Medtronic-dual-chamber-DOI 2012  . CAD (coronary artery disease)     followed by Dr. Wynonia Lawman  . Arthritis   . History of skin cancer   . Frequency of urination   . Nocturia   . Chronic kidney disease     kidneys function at "20 %" followed by Dr. Mercy Moore  . Cancer     history of skin cancer  . Dysrhythmia     a fib  . Prostate cancer   . Ruptured disk 1992  . Skin cancer     Medications:  Prescriptions prior to admission  Medication Sig Dispense Refill  . amiodarone (PACERONE) 200 MG tablet Take 200 mg by mouth every morning.      Marland Kitchen amLODipine (NORVASC) 10 MG tablet Take 10 mg by mouth every morning.       Marland Kitchen atenolol (TENORMIN) 25 MG tablet Take 25 mg by mouth every morning.       . finasteride (PROSCAR) 5 MG tablet Take 5 mg by mouth daily.        . folic acid  (FOLVITE) 027 MCG tablet Take 400 mcg by mouth daily.        . furosemide (LASIX) 40 MG tablet Take 40 mg by mouth 2 (two) times daily.      Marland Kitchen glimepiride (AMARYL) 1 MG tablet Take 1 mg by mouth daily before breakfast.        . Glucosamine HCl 1500 MG TABS Take 1 tablet by mouth 2 (two) times daily.       . Horse Chestnut 300 MG CAPS Take 1 capsule by mouth daily.       . hyoscyamine (LEVSIN, ANASPAZ) 0.125 MG tablet Take 1 tablet (0.125 mg total) by mouth every 4 (four) hours as needed (bladder spasms).  40 tablet  4  . Multiple Vitamin (MULTIVITAMIN WITH MINERALS) TABS tablet Take 1 tablet by mouth daily.      . Omega-3 Fatty Acids (FISH OIL) 1200 MG CAPS Take 1,200 mg by mouth daily.      Marland Kitchen warfarin (COUMADIN) 4 MG tablet Take 4 mg by mouth every evening.        Assessment: 35 yoM presents 5/16 with SOB,  no relief with position change. Hx resolved PNA 3 months ago. CXR showed pulmonary edema consistent with CHF. Pharmacy consulted to dose coumadin.  PTA the patient was taking 4 mg daily  INR this morning remains SUBtherapeutic though trending up (INR 1.83 << 1.45, goal of 2-3). No CBC today - no overt s/sx of bleeding noted.  Goal of Therapy:  INR 2-3 Monitor platelets by anticoagulation protocol: Yes   Plan:  1. Warfarin 4 mg x 1 dose at 1800 today (if still here) 2. Will continue to monitor for any signs/symptoms of bleeding and will follow up with PT/INR in the a.m.   Alycia Rossetti, PharmD, BCPS Clinical Pharmacist Pager: [phone] 07/27/2013 10:15 AM

## 2013-07-27 NOTE — Discharge Summary (Signed)
Physician Discharge Summary  Christopher [name] BTD:[id number] DOB: Feb 22, 1927 DOA: 07/24/2013  PCP: Tivis Ringer, MD  Admit date: 07/24/2013 Discharge date: 07/27/2013  Time spent: 40 minutes  Recommendations for Outpatient Follow-up:  1. Follow up with Dr. Wynonia Lawman in 1 week, he is below his estimated dry weight. 2. check a b-met and Bp.  BNP    Component Value Date/Time   PROBNP 9261.0* 07/24/2013 0120   Filed Weights   07/25/13 0440 07/26/13 0539 07/27/13 0648  Weight: 105.688 kg (233 lb) 103.5 kg (228 lb 2.8 oz) 102.6 kg (226 lb 3.1 oz)     Discharge Diagnoses:  Principal Problem:   Acute respiratory failure Active Problems:   CAD (coronary artery disease)   Atrial fibrillation   Hyperlipidemia   Hypertensive heart disease without CHF   Kidney disease, chronic, stage III (GFR 30-59 ml/min)   Long-term (current) use of anticoagulants   BPH (benign prostatic hyperplasia)   Acute diastolic congestive heart failure, NYHA class 2   DM type 2 causing renal disease   Shortness of breath   Discharge Condition: stable  Diet recommendation: low sodium    History of present illness:  80 y.o. Caucasian male with history of type 2 diabetes, hypertension, BPH, coronary artery disease, status post pacemaker, arthritis, chronic kidney disease stage III, A. fib on chronic anticoagulation, and prostate cancer presents with the above complaints. Patient reports that his symptoms started about 3 days ago when he started feeling short of breath. Initially his symptoms were limited to the wheezing which since then has progressed. Today he was even more short of breath as a result he presented to the emergency department for further evaluation. Over the last few weeks, he has noted that he has been increasingly short of breath with movement or any activity. In the emergency department, chest x-ray showed pulmonary edema and findings suggestive of congestive heart failure. Hospitalist  service was asked to admit the patient for further care and management. Patient denies any recent fevers, chills, nausea, vomiting, chest pain, abdominal pain, diarrhea, headaches or vision changes.   Hospital Course:  *Acute respiratory failure due Acute diastolic heart failure:  - Started on IV Lasix on admission. With good diuresis.  - Estimated dry weight is 104 kg. On admission 107 ->105.6 ->103.5 ->102.6 kg.  - Strict I and O;'s daily weights, during his hospital stay.  - On beta blockers, lasix, not on and ACE-I probably due to renal function.  - d/c Norvasc and we were able to further diurese him. - follow up with his cardiologist as an outpaitnet.  DM type 2 causing renal disease:  - cont current dose medications.   Atrial fibrillation  - rate controlled on amiodarone.  - INR therapeutic.   Kidney disease, chronic, stage III (GFR 30-59 ml/min)  - Unclear baseline.  - previous was 2.7-3.1  Hyperlipidemia  - cont statins.   Hypertensive heart disease:  - Bp currently stable.  - d/c norvasc. Cont on Beta blocker, lasix.  CAD (coronary artery disease)  - CP free.    Procedures:  Echo EF 36% diastolic heart failure  Consultations:  none  Discharge Exam: Filed Vitals:   07/27/13 0950  BP: 102/59  Pulse: 58  Temp:   Resp:     General: A&O x3 Cardiovascular: RRR Respiratory: good air movement CTA B/L  Discharge Instructions      Discharge Instructions   Diet - low sodium heart healthy    Complete by:  As directed  Increase activity slowly    Complete by:  As directed             Medication List    STOP taking these medications       amLODipine 10 MG tablet  Commonly known as:  NORVASC      TAKE these medications       amiodarone 200 MG tablet  Commonly known as:  PACERONE  Take 200 mg by mouth every morning.     atenolol 25 MG tablet  Commonly known as:  TENORMIN  Take 25 mg by mouth every morning.     finasteride 5 MG tablet    Commonly known as:  PROSCAR  Take 5 mg by mouth daily.     Fish Oil 1200 MG Caps  Take 1,200 mg by mouth daily.     folic acid 400 MCG tablet  Commonly known as:  FOLVITE  Take 400 mcg by mouth daily.     furosemide 40 MG tablet  Commonly known as:  LASIX  Take 40 mg by mouth 2 (two) times daily.     glimepiride 1 MG tablet  Commonly known as:  AMARYL  Take 1 mg by mouth daily before breakfast.     Glucosamine HCl 1500 MG Tabs  Take 1 tablet by mouth 2 (two) times daily.     [place] 300 MG Caps  Take 1 capsule by mouth daily.     hyoscyamine 0.125 MG tablet  Commonly known as:  LEVSIN, ANASPAZ  Take 1 tablet (0.125 mg total) by mouth every 4 (four) hours as needed (bladder spasms).     multivitamin with minerals Tabs tablet  Take 1 tablet by mouth daily.     warfarin 4 MG tablet  Commonly known as:  COUMADIN  Take 4 mg by mouth every evening.       Allergies  Allergen Reactions  . Crestor [Rosuvastatin]     Aches in joint   . Lipitor [Atorvastatin]     Joint aching  . Tramadol Nausea Only  . Vicodin [Hydrocodone-Acetaminophen]     "Made me nervous"  . Penicillins Rash   Follow-up Information   Follow up with Darden Palmer, MD On 08/03/2013. (hospital follow up. 08/03/2013 11:45)    Specialty:  Cardiology   Contact information:   [address] [place] [address] [place] [address] [phone]        The results of significant diagnostics from this hospitalization (including imaging, microbiology, ancillary and laboratory) are listed below for reference.    Significant Diagnostic Studies: Dg Chest 2 View  07/24/2013   CLINICAL DATA:  Acute shortness of breath.  EXAM: CHEST  2 VIEW  COMPARISON:  07/20/2013.  FINDINGS: Stable enlarged cardiac silhouette and post CABG changes. Stable left subclavian pacemaker leads. Mild increase in linear density at both lung bases. Increased prominence of the interstitial markings. No pleural fluid.  Thoracolumbar spine degenerative changes.  IMPRESSION: 1. Interval mild interstitial pulmonary edema. 2. Increased bibasilar atelectasis. 3. Stable cardiomegaly   Electronically Signed   By: Gordan Payment M.D.   On: 07/24/2013 02:48   Dg Chest 2 View  07/20/2013   CLINICAL DATA:  Shortness of breath, heart surgery 10 years ago  EXAM: CHEST  2 VIEW  COMPARISON:  DG CHEST 2 VIEW dated 05/28/2013; DG CHEST 2 VIEW dated 03/29/2013; DG CHEST 2 VIEW dated 07/15/2012  FINDINGS: Mild cardiac enlargement. Cardiac pacer unchanged. Vascular pattern normal. Mild scarring or atelectasis in the  lower lung zones bilaterally. This is stable. No pleural effusion.  IMPRESSION: No acute findings   Electronically Signed   By: Skipper Cliche M.D.   On: 07/20/2013 14:10   Dg Cervical Spine Complete  07/01/2013   CLINICAL DATA:  Followup CT and bone scans, prostate cancer high risk of metastasis  EXAM: CERVICAL SPINE  4+ VIEWS  COMPARISON:  Bone scan 06/30/2013  FINDINGS: Prevertebral soft tissues normal thickness.  Diffuse osseous demineralization.  Multilevel facet degenerative changes throughout cervical region.  Disc space narrowing with endplate spur formation C4-C5, C5-C6.  Encroachment upon multiple bilateral foramina by combinations of uncovertebral and facet hypertrophy, greatest at left C3-C4 and right C4-C5 and C3-C4.  Vertebral body heights maintained without fracture or subluxation.  Odontoid approximate right lateral mass of C1 though head is rotated to the left.  Mild facet degenerative changes at the C1-C2 articulation on right.  Atherosclerotic calcification of the left carotid system.  IMPRESSION: Multilevel degenerative disc and facet disease changes.  No acute abnormalities.  No definite abnormalities are seen at the left C1-C2 articulation radiographically.  It is uncertain whether the scintigraphic abnormality is related to the craniocervical junction or to the left maxilla/skullbase ; if patient is at high risk for  osseous metastasis, consider MR imaging to exclude skullbase metastasis.   Electronically Signed   By: Lavonia Dana M.D.   On: 07/01/2013 08:42   Nm Bone Scan Whole Body  06/30/2013   CLINICAL DATA:  Prostate cancer, PSA 6.41 03/09/2013  EXAM: NUCLEAR MEDICINE WHOLE BODY BONE SCAN  TECHNIQUE: Whole body anterior and posterior images were obtained approximately 3 hours after intravenous injection of radiopharmaceutical.  RADIOPHARMACEUTICALS:  27.5 Technetium-99 MDP  COMPARISON:  CT pelvis 06/30/2013  FINDINGS: There are no foci of increased or decreased radiotracer uptake to suggest osseous metastatic disease.  There is a focal area of increased radiotracer uptake to the left of midline just inferior to the level of the orbit at the expected location of the articulation between the lateral mass of C1 and C2.  There are increased foci of radiotracer uptake in the left [facility] joint, right patellofemoral compartment, left patellofemoral compartment and left medial femorotibial compartment consistent with osteoarthritic changes.  Normal physiologic activity is identified within the kidneys and urinary bladder.  IMPRESSION: 1. There is a focal area of increased radiotracer uptake to the left of midline just inferior to the level of the orbit at the expected location of the articulation between the lateral mass of C1 and C2. These likely represent degenerative change. Correlation with plain radiographs is recommended.  2.  Otherwise no scintigraphic evidence of metastatic disease.   Electronically Signed   By: Kathreen Devoid   On: 06/30/2013 14:23    Microbiology: No results found for this or any previous visit (from the past 240 hour(s)).   Labs: Basic Metabolic Panel:  Recent Labs Lab 07/24/13 0120 07/25/13 0440 07/26/13 0445 07/27/13 0518  NA 144 143 143 144  K 4.2 3.9 3.7 4.2  CL 106 104 102 103  CO2 _0 GLUCOSE 121* 93 86 106*  BUN 54* 59* 63* 65*  CREATININE 2.79* 2.98* 3.00* 3.19*    CALCIUM 9.0 8.9 8.7 8.9   Liver Function Tests: No results found for this basename: AST, ALT, ALKPHOS, BILITOT, PROT, ALBUMIN,  in the last 168 hours No results found for this basename: LIPASE, AMYLASE,  in the last 168 hours No results found for this basename: AMMONIA,  in  the last 168 hours CBC:  Recent Labs Lab 07/24/13 0120 07/24/13 0902  WBC 12.5* 9.0  NEUTROABS 10.5*  --   HGB 10.9* 10.1*  HCT 34.8* 32.1*  MCV 92.3 91.2  PLT 298 282   Cardiac Enzymes:  Recent Labs Lab 07/24/13 0120 07/24/13 0902 07/24/13 1636  TROPONINI <0.30 <0.30 <0.30   BNP: BNP (last 3 results)  Recent Labs  07/24/13 0120  PROBNP 9261.0*   CBG:  Recent Labs Lab 07/26/13 0635 07/26/13 1119 07/26/13 1645 07/26/13 2140 07/27/13 0637  GLUCAP 98 117* 135* 182* 100*       Signed:  Charlynne Cousins  [facility] 07/27/2013, 9:53 AM

## 2013-07-27 NOTE — Progress Notes (Signed)
[facility] PROGRESS NOTE Interim History: 80 y.o. Caucasian male with history of type 2 diabetes, hypertension, BPH, coronary artery disease, status post pacemaker, arthritis, chronic kidney disease stage III, A. fib on chronic anticoagulation, and prostate cancer presents with the above complaints. Patient reports that his symptoms started about 3 days ago when he started feeling short of breath. Initially his symptoms were limited to the wheezing which since then has progressed.   Filed Weights   07/25/13 0440 07/26/13 0539 07/27/13 0648  Weight: 105.688 kg (233 lb) 103.5 kg (228 lb 2.8 oz) 102.6 kg (226 lb 3.1 oz)        Intake/Output Summary (Last 24 hours) at 07/27/13 0945 Last data filed at 07/27/13 2202  Gross per 24 hour  Intake   1060 ml  Output   1675 ml  Net   -615 ml     Assessment/Plan: *Acute respiratory failure due to CHF Undetermine: - Cont IV Lasix on admission. Minimal JVD. - Estimated dry weight is 104 kg. On admission 107 ->105.6 ->103.5 ->102.6 kg. - Strict I and O;'s daily weights. Daily weights, fluid restriction. - On beta blockers, lasix, not on and ACE-I probably due to renal function. - d/c Norvasc. Home in am.  DM type 2 causing renal disease: - cont current dose medications.  Atrial fibrillation - rate controlled on amiodarone. - INR therapeutic. Coumadin per pharmacy.   Kidney disease, chronic, stage III (GFR 30-59 ml/min) - Unclear baseline. - previous was 2.7. - follow trends   Hyperlipidemia - cont statins.   Hypertensive heart disease: - Bp currently stable.  CAD (coronary artery disease) - CP free.    Code Status: full Family Communication: wife  Disposition Plan: inpatinet   Consultants:  none  Procedures: ECHO: pending  Antibiotics:  None  HPI/Subjective: Able to lay flat, cough resolved. No complains  Objective: Filed Vitals:   07/26/13 1519 07/26/13 1844 07/26/13 2137 07/27/13 0648  BP: 94/63  101/62 100/58 98/63  Pulse: 62 68 61 55  Temp: 97.8 F (36.6 C)  98 F (36.7 C) 97.5 F (36.4 C)  TempSrc: Oral  Oral Oral  Resp: 18  18 17   Height:      Weight:    102.6 kg (226 lb 3.1 oz)  SpO2: 90%  90% 93%     Exam:  General: Alert, awake, oriented x3, in no acute distress.  HEENT: No bruits, no goiter. minimalJVD Heart: Regular rate and rhythm. Lungs: Good air movement, clear Abdomen: Soft, nontender, nondistended, positive bowel sounds.   Data Reviewed: Basic Metabolic Panel:  Recent Labs Lab 07/24/13 0120 07/25/13 0440 07/26/13 0445 07/27/13 0518  NA 144 143 143 144  K 4.2 3.9 3.7 4.2  CL 106 104 102 103  CO2 25 26 26 28   GLUCOSE 121* 93 86 106*  BUN 54* 59* 63* 65*  CREATININE 2.79* 2.98* 3.00* 3.19*  CALCIUM 9.0 8.9 8.7 8.9   Liver Function Tests: No results found for this basename: AST, ALT, ALKPHOS, BILITOT, PROT, ALBUMIN,  in the last 168 hours No results found for this basename: LIPASE, AMYLASE,  in the last 168 hours No results found for this basename: AMMONIA,  in the last 168 hours CBC:  Recent Labs Lab 07/24/13 0120 07/24/13 0902  WBC 12.5* 9.0  NEUTROABS 10.5*  --   HGB 10.9* 10.1*  HCT 34.8* 32.1*  MCV 92.3 91.2  PLT 298 282   Cardiac Enzymes:  Recent Labs Lab 07/24/13 0120 07/24/13 0902 07/24/13 1636  TROPONINI <0.30 <0.30 <0.30   BNP (last 3 results)  Recent Labs  07/24/13 0120  PROBNP 9261.0*   CBG:  Recent Labs Lab 07/26/13 0635 07/26/13 1119 07/26/13 1645 07/26/13 2140 07/27/13 0637  GLUCAP 98 117* 135* 182* 100*    No results found for this or any previous visit (from the past 240 hour(s)).   Studies: No results found.  Scheduled Meds: . amiodarone  200 mg Oral q morning - 10a  . atenolol  25 mg Oral q morning - 10a  . finasteride  5 mg Oral Daily  . folic acid  1 mg Oral Daily  . furosemide  80 mg Intravenous 3 times per day  . [START ON 07/28/2013] furosemide  60 mg Oral BID  . glimepiride  1 mg  Oral QAC breakfast  . insulin aspart  0-5 Units Subcutaneous QHS  . insulin aspart  0-9 Units Subcutaneous TID WC  . multivitamin with minerals  1 tablet Oral Daily  . omega-3 acid ethyl esters  1,000 mg Oral Daily  . sodium chloride  3 mL Intravenous Q12H  . Warfarin - Pharmacist Dosing Inpatient   Does not apply q1800   Continuous Infusions:    [facility] Pager [phone] If 8PM-8AM, please contact night-coverage at [email or web address], password [facility] 07/27/2013, 9:45 AM  LOS: 3 days

## 2013-07-28 DIAGNOSIS — N400 Enlarged prostate without lower urinary tract symptoms: Secondary | ICD-10-CM

## 2013-07-28 DIAGNOSIS — I5031 Acute diastolic (congestive) heart failure: Secondary | ICD-10-CM

## 2013-07-28 LAB — BASIC METABOLIC PANEL
BUN: 72 mg/dL — ABNORMAL HIGH (ref 6–23)
CHLORIDE: 101 meq/L (ref 96–112)
CO2: 30 mEq/L (ref 19–32)
CREATININE: 3.54 mg/dL — AB (ref 0.50–1.35)
Calcium: 9.2 mg/dL (ref 8.4–10.5)
GFR calc non Af Amer: 15 mL/min — ABNORMAL LOW (ref >90)
GFR, EST AFRICAN AMERICAN: 17 mL/min — AB (ref >90)
Glucose, Bld: 101 mg/dL — ABNORMAL HIGH (ref 70–99)
Potassium: 4.1 mEq/L (ref 3.7–5.3)
Sodium: 143 mEq/L (ref 137–147)

## 2013-07-28 LAB — GLUCOSE, CAPILLARY
GLUCOSE-CAPILLARY: 101 mg/dL — AB (ref 70–99)
Glucose-Capillary: 111 mg/dL — ABNORMAL HIGH (ref 70–99)

## 2013-07-28 LAB — PROTIME-INR
INR: 2.34 — AB (ref 0.00–1.49)
Prothrombin Time: 24.9 seconds — ABNORMAL HIGH (ref 11.6–15.2)

## 2013-07-28 MED ORDER — FUROSEMIDE 40 MG PO TABS: 60.0000 mg | ORAL_TABLET | Freq: Two times a day (BID) | ORAL | Status: DC

## 2013-07-28 MED ORDER — WARFARIN SODIUM 4 MG PO TABS
4.0000 mg | ORAL_TABLET | Freq: Every day | ORAL | Status: DC
  Filled 2013-07-28: qty 1

## 2013-07-28 NOTE — Consult Note (Addendum)
Heart Failure Navigator Consult Note  Presentation: Christopher [name]  is a 80 y.o. Caucasian male with history of type 2 diabetes, hypertension, BPH, coronary artery disease, status post pacemaker, arthritis, chronic kidney disease stage III, A. fib on chronic anticoagulation, and prostate cancer presents with the above complaints. Patient reports that his symptoms started about 3 days ago when he started feeling short of breath. Initially his symptoms were limited to the wheezing which since then has progressed. Today he was even more short of breath as a result he presented to the emergency department for further evaluation. Over the last few weeks, he has noted that he has been increasingly short of breath with movement or any activity. In the emergency department, chest x-ray showed pulmonary edema and findings suggestive of congestive heart failure. Hospitalist service was asked to admit the patient for further care and management. Patient denies any recent fevers, chills, nausea, vomiting, chest pain, abdominal pain, diarrhea, headaches or vision changes    Past Medical History  Diagnosis Date  . Lumbar disc disease   . BPH (benign prostatic hyperplasia)   . HTN (hypertension)   . Diabetes mellitus type II   . Sinus node dysfunction     symptomatic bradycardia  . Pacemaker     [employer]tronic-dual-chamber-DOI 2012  . CAD (coronary artery disease)     followed by Dr. Wynonia Lawman  . Arthritis   . History of skin cancer   . Frequency of urination   . Nocturia   . Chronic kidney disease     kidneys function at "20 %" followed by Dr. Mercy Moore  . Cancer     history of skin cancer  . Dysrhythmia     a fib  . Prostate cancer   . Ruptured disk 1992  . Skin cancer     History   Social History  . Marital Status: Married    Spouse Name: N/A    Number of Children: 3  . Years of Education: N/A   Occupational History  . retired    Social History Main Topics  . Smoking status: Former Smoker --  0.75 packs/day    Types: Cigarettes    Quit date: 03/12/1968  . Smokeless tobacco: Never Used  . Alcohol Use: No  . Drug Use: No  . Sexual Activity: Not Currently   Other Topics Concern  . None   Social History Narrative  . None    ECHO:Echo EF 78% diastolic heart failure--report not currently available   BNP    Component Value Date/Time   PROBNP 9261.0* 07/24/2013 0120    Education Assessment and Provision:  Detailed education and instructions provided on heart failure disease management including the following:  Signs and symptoms of Heart Failure When to call the physician Importance of daily weights Low sodium diet  Fluid restriction Medication management Anticipated future follow-up appointments  Patient education given on each of the above topics.  Patient and wife acknowledge understanding and acceptance of all instructions.  Very pleasant couple.  They asked pertinent questions and were very grateful for education.  They were able to teach back most topics above . I encouraged them to call me with any further questions.    Education Materials:  "Living Better With Heart Failure" Booklet, Daily Weight Tracker Tool and Heart Failure Educational Video.   High Risk Criteria for Readmission and/or Poor Patient Outcomes:   EF <30%- No--55%  2 or more admissions in 6 months- No  Difficult social situation- No  Demonstrates  medication noncompliance- No    Barriers of Care:  Knowledge of HF  Discharge Planning:  Plans to discharge to home with wife.  Has outpatient appointment with Dr. Wynonia Lawman on May 27th.

## 2013-07-28 NOTE — Progress Notes (Signed)
Patient sitting in bed with wife at bedside. No complaints of SOB. Denies pain and discomfort.

## 2013-07-28 NOTE — Progress Notes (Signed)
[place] for Coumadin Indication: atrial fibrillation  Allergies  Allergen Reactions  . Crestor [Rosuvastatin]     Aches in joint   . Lipitor [Atorvastatin]     Joint aching  . Tramadol Nausea Only  . Vicodin [Hydrocodone-Acetaminophen]     "Made me nervous"  . Penicillins Rash    Patient Measurements: Height: 6\' 1"  (185.4 cm) Weight: 223 lb 15.8 oz (101.6 kg) (scale c) IBW/kg (Calculated) : 79.9  Vital Signs: Temp: 97.1 F (36.2 C) (05/20 0859) Temp src: Oral (05/20 0859) BP: 92/56 mmHg (05/20 0859) Pulse Rate: 55 (05/20 0859)  Labs:  Recent Labs  07/26/13 0445 07/27/13 0518 07/28/13 0545  LABPROT 17.3* 20.6* 24.9*  INR 1.45 1.83* 2.34*  CREATININE 3.00* 3.19* 3.54*    Estimated Creatinine Clearance: 19.5 ml/min (by C-G formula based on Cr of 3.54).   Assessment: 83 yoM presents 5/16 with SOB, no relief with position change. Hx resolved PNA 3 months ago. CXR showed pulmonary edema consistent with CHF. Pharmacy consulted to dose coumadin.  PTA the patient was taking 4 mg daily  INR this morning is therapeutic at 2.34.  No CBC today - no overt s/sx of bleeding noted.  Goal of Therapy:  INR 2-3 Monitor platelets by anticoagulation protocol: Yes   Plan:  1. Resume home dose of Warfarin 4 mg daily 2. Will continue to monitor for any signs/symptoms of bleeding and will follow up with PT/INR in the a.m.  Eudelia Bunch, Pharm.D. [phone] 07/28/2013 10:24 AM

## 2013-07-28 NOTE — Progress Notes (Signed)
UR completed Cayson Kalb K. Jelena Malicoat, RN, BSN, [place], CCM  07/28/2013 3:29 PM

## 2013-07-28 NOTE — Progress Notes (Signed)
Discharged home with instructions for follow-up appointments and medications for home. Patient stated understanding of discharge teaching. Left via wheelchair with wife.

## 2013-07-28 NOTE — Progress Notes (Signed)
[facility] PROGRESS NOTE Interim History: 80 y.o. Caucasian male with history of type 2 diabetes, hypertension, BPH, coronary artery disease, status post pacemaker, arthritis, chronic kidney disease stage III, A. fib on chronic anticoagulation, and prostate cancer presents with the above complaints. Patient reports that his symptoms started about 3 days ago when he started feeling short of breath. Initially his symptoms were limited to the wheezing which since then has progressed.   Filed Weights   07/26/13 0539 07/27/13 0648 07/28/13 0642  Weight: 103.5 kg (228 lb 2.8 oz) 102.6 kg (226 lb 3.1 oz) 101.6 kg (223 lb 15.8 oz)        Intake/Output Summary (Last 24 hours) at 07/28/13 1430 Last data filed at 07/28/13 1300  Gross per 24 hour  Intake    920 ml  Output   2300 ml  Net  -1380 ml   Assessment/Plan: *Acute respiratory failure due to CHF Undetermine: - Estimated dry weight is 104 kg. On admission 107 ->105.6 ->103.5 ->102.6 kg>101. - daily weights and low sodium diet - On beta blockers and lasix, not on and ACE-I probably due to renal function. - d/c Norvasc.  -Home today; discharge summary and medication instructions reviewed and appropriate from 5/19.  DM type 2 causing renal disease: - cont current dose medications.  Atrial fibrillation - rate controlled on amiodarone. - INR therapeutic. Coumadin per pharmacy.  Acute on chronic Kidney disease, chronic, stage III (GFR 30-59 ml/min) - Unclear baseline. - previous was 2.7-3. - follow trends; Cr 3.5 at discharge -BMET in outpatient setting   Hyperlipidemia - cont statins.   Hypertensive heart disease: - Bp currently stable.  CAD (coronary artery disease) -CP free.    Code Status: full Family Communication: wife  Disposition Plan: inpatinet   Consultants:  none  Procedures: ECHO: pending  Antibiotics:  None  HPI/Subjective: Able to lay flat, cough resolved. No  complains  Objective: Filed Vitals:   07/27/13 1457 07/27/13 2144 07/28/13 0642 07/28/13 0859  BP: 97/60 106/59 110/57 92/56  Pulse: 59 59 60 55  Temp: 97.6 F (36.4 C) 98.3 F (36.8 C) 97.4 F (36.3 C) 97.1 F (36.2 C)  TempSrc: Oral Oral Oral Oral  Resp: 18 18 16 18   Height:      Weight:   101.6 kg (223 lb 15.8 oz)   SpO2: 93% 95% 98% 93%     Exam:  General: Alert, awake, oriented x3, in no acute distress.  HEENT: No bruits, no goiter. minimalJVD Heart: Regular rate and rhythm. Lungs: Good air movement, clear Abdomen: Soft, nontender, nondistended, positive bowel sounds.   Data Reviewed: Basic Metabolic Panel:  Recent Labs Lab 07/24/13 0120 07/25/13 0440 07/26/13 0445 07/27/13 0518 07/28/13 0545  NA 144 143 143 144 143  K 4.2 3.9 3.7 4.2 4.1  CL 106 104 102 103 101  CO2 25 26 26 28 30   GLUCOSE 121* 93 86 106* 101*  BUN 54* 59* 63* 65* 72*  CREATININE 2.79* 2.98* 3.00* 3.19* 3.54*  CALCIUM 9.0 8.9 8.7 8.9 9.2   CBC:  Recent Labs Lab 07/24/13 0120 07/24/13 0902  WBC 12.5* 9.0  NEUTROABS 10.5*  --   HGB 10.9* 10.1*  HCT 34.8* 32.1*  MCV 92.3 91.2  PLT 298 282   Cardiac Enzymes:  Recent Labs Lab 07/24/13 0120 07/24/13 0902 07/24/13 1636  TROPONINI <0.30 <0.30 <0.30   BNP (last 3 results)  Recent Labs  07/24/13 0120  PROBNP 9261.0*   CBG:  Recent Labs Lab  07/27/13 1047 07/27/13 1605 07/27/13 2143 07/28/13 0545 07/28/13 1114  GLUCAP 112* 162* 129* 111* 101*    Studies: No results found.  Scheduled Meds: . amiodarone  200 mg Oral q morning - 10a  . atenolol  25 mg Oral q morning - 10a  . finasteride  5 mg Oral Daily  . folic acid  1 mg Oral Daily  . furosemide  60 mg Oral BID  . glimepiride  1 mg Oral QAC breakfast  . insulin aspart  0-5 Units Subcutaneous QHS  . insulin aspart  0-9 Units Subcutaneous TID WC  . multivitamin with minerals  1 tablet Oral Daily  . omega-3 acid ethyl esters  1,000 mg Oral Daily  . sodium  chloride  3 mL Intravenous Q12H  . warfarin  4 mg Oral q1800  . Warfarin - Pharmacist Dosing Inpatient   Does not apply q1800   Continuous Infusions:   >30 minutes  Barton Dubois  [facility] Pager [phone] If 8PM-8AM, please contact night-coverage at [email or web address], password [facility] 07/28/2013, 2:30 PM  LOS: 4 days

## 2013-08-03 DIAGNOSIS — C61 Malignant neoplasm of prostate: Secondary | ICD-10-CM | POA: Diagnosis not present

## 2013-08-03 DIAGNOSIS — N139 Obstructive and reflux uropathy, unspecified: Secondary | ICD-10-CM | POA: Diagnosis not present

## 2013-08-03 DIAGNOSIS — N402 Nodular prostate without lower urinary tract symptoms: Secondary | ICD-10-CM | POA: Diagnosis not present

## 2013-08-03 DIAGNOSIS — N138 Other obstructive and reflux uropathy: Secondary | ICD-10-CM | POA: Diagnosis not present

## 2013-08-03 DIAGNOSIS — N401 Enlarged prostate with lower urinary tract symptoms: Secondary | ICD-10-CM | POA: Diagnosis not present

## 2013-08-04 DIAGNOSIS — N183 Chronic kidney disease, stage 3 unspecified: Secondary | ICD-10-CM | POA: Diagnosis not present

## 2013-08-04 DIAGNOSIS — E785 Hyperlipidemia, unspecified: Secondary | ICD-10-CM | POA: Diagnosis not present

## 2013-08-04 DIAGNOSIS — I4891 Unspecified atrial fibrillation: Secondary | ICD-10-CM | POA: Diagnosis not present

## 2013-08-04 DIAGNOSIS — I119 Hypertensive heart disease without heart failure: Secondary | ICD-10-CM | POA: Diagnosis not present

## 2013-08-04 DIAGNOSIS — N184 Chronic kidney disease, stage 4 (severe): Secondary | ICD-10-CM | POA: Diagnosis not present

## 2013-08-04 DIAGNOSIS — R0602 Shortness of breath: Secondary | ICD-10-CM | POA: Diagnosis not present

## 2013-08-04 DIAGNOSIS — I5032 Chronic diastolic (congestive) heart failure: Secondary | ICD-10-CM | POA: Diagnosis not present

## 2013-08-04 DIAGNOSIS — Z7901 Long term (current) use of anticoagulants: Secondary | ICD-10-CM | POA: Diagnosis not present

## 2013-08-04 DIAGNOSIS — Z951 Presence of aortocoronary bypass graft: Secondary | ICD-10-CM | POA: Diagnosis not present

## 2013-08-04 DIAGNOSIS — I2510 Atherosclerotic heart disease of native coronary artery without angina pectoris: Secondary | ICD-10-CM | POA: Diagnosis not present

## 2013-08-10 DIAGNOSIS — N189 Chronic kidney disease, unspecified: Secondary | ICD-10-CM | POA: Diagnosis not present

## 2013-08-11 DIAGNOSIS — I5032 Chronic diastolic (congestive) heart failure: Secondary | ICD-10-CM | POA: Diagnosis not present

## 2013-08-11 DIAGNOSIS — Z950 Presence of cardiac pacemaker: Secondary | ICD-10-CM | POA: Diagnosis not present

## 2013-08-11 DIAGNOSIS — E1129 Type 2 diabetes mellitus with other diabetic kidney complication: Secondary | ICD-10-CM | POA: Diagnosis not present

## 2013-08-11 DIAGNOSIS — I129 Hypertensive chronic kidney disease with stage 1 through stage 4 chronic kidney disease, or unspecified chronic kidney disease: Secondary | ICD-10-CM | POA: Diagnosis not present

## 2013-08-11 DIAGNOSIS — N184 Chronic kidney disease, stage 4 (severe): Secondary | ICD-10-CM | POA: Diagnosis not present

## 2013-08-11 DIAGNOSIS — I4891 Unspecified atrial fibrillation: Secondary | ICD-10-CM | POA: Diagnosis not present

## 2013-08-11 DIAGNOSIS — Z951 Presence of aortocoronary bypass graft: Secondary | ICD-10-CM | POA: Diagnosis not present

## 2013-08-11 DIAGNOSIS — I119 Hypertensive heart disease without heart failure: Secondary | ICD-10-CM | POA: Diagnosis not present

## 2013-08-11 DIAGNOSIS — Z7901 Long term (current) use of anticoagulants: Secondary | ICD-10-CM | POA: Diagnosis not present

## 2013-08-11 DIAGNOSIS — I2510 Atherosclerotic heart disease of native coronary artery without angina pectoris: Secondary | ICD-10-CM | POA: Diagnosis not present

## 2013-08-17 DIAGNOSIS — N184 Chronic kidney disease, stage 4 (severe): Secondary | ICD-10-CM | POA: Diagnosis not present

## 2013-08-23 NOTE — Progress Notes (Signed)
Chart reviewed for approved QI project.  

## 2013-08-24 DIAGNOSIS — E119 Type 2 diabetes mellitus without complications: Secondary | ICD-10-CM | POA: Diagnosis not present

## 2013-08-24 DIAGNOSIS — H01009 Unspecified blepharitis unspecified eye, unspecified eyelid: Secondary | ICD-10-CM | POA: Diagnosis not present

## 2013-08-24 DIAGNOSIS — Z961 Presence of intraocular lens: Secondary | ICD-10-CM | POA: Diagnosis not present

## 2013-08-24 DIAGNOSIS — H43819 Vitreous degeneration, unspecified eye: Secondary | ICD-10-CM | POA: Diagnosis not present

## 2013-08-25 DIAGNOSIS — Z951 Presence of aortocoronary bypass graft: Secondary | ICD-10-CM | POA: Diagnosis not present

## 2013-08-25 DIAGNOSIS — I119 Hypertensive heart disease without heart failure: Secondary | ICD-10-CM | POA: Diagnosis not present

## 2013-08-25 DIAGNOSIS — N184 Chronic kidney disease, stage 4 (severe): Secondary | ICD-10-CM | POA: Diagnosis not present

## 2013-08-25 DIAGNOSIS — I2510 Atherosclerotic heart disease of native coronary artery without angina pectoris: Secondary | ICD-10-CM | POA: Diagnosis not present

## 2013-08-25 DIAGNOSIS — I5032 Chronic diastolic (congestive) heart failure: Secondary | ICD-10-CM | POA: Diagnosis not present

## 2013-08-25 DIAGNOSIS — Z950 Presence of cardiac pacemaker: Secondary | ICD-10-CM | POA: Diagnosis not present

## 2013-08-25 DIAGNOSIS — I4891 Unspecified atrial fibrillation: Secondary | ICD-10-CM | POA: Diagnosis not present

## 2013-08-25 DIAGNOSIS — Z7901 Long term (current) use of anticoagulants: Secondary | ICD-10-CM | POA: Diagnosis not present

## 2013-08-26 DIAGNOSIS — E1159 Type 2 diabetes mellitus with other circulatory complications: Secondary | ICD-10-CM | POA: Diagnosis not present

## 2013-08-26 DIAGNOSIS — I4891 Unspecified atrial fibrillation: Secondary | ICD-10-CM | POA: Diagnosis not present

## 2013-08-26 DIAGNOSIS — N184 Chronic kidney disease, stage 4 (severe): Secondary | ICD-10-CM | POA: Diagnosis not present

## 2013-08-26 DIAGNOSIS — I5033 Acute on chronic diastolic (congestive) heart failure: Secondary | ICD-10-CM | POA: Diagnosis not present

## 2013-08-26 DIAGNOSIS — Z7901 Long term (current) use of anticoagulants: Secondary | ICD-10-CM | POA: Diagnosis not present

## 2013-08-26 DIAGNOSIS — N402 Nodular prostate without lower urinary tract symptoms: Secondary | ICD-10-CM | POA: Diagnosis not present

## 2013-08-26 DIAGNOSIS — I10 Essential (primary) hypertension: Secondary | ICD-10-CM | POA: Diagnosis not present

## 2013-08-26 DIAGNOSIS — I25810 Atherosclerosis of coronary artery bypass graft(s) without angina pectoris: Secondary | ICD-10-CM | POA: Diagnosis not present

## 2013-08-26 DIAGNOSIS — N138 Other obstructive and reflux uropathy: Secondary | ICD-10-CM | POA: Diagnosis not present

## 2013-08-26 DIAGNOSIS — N401 Enlarged prostate with lower urinary tract symptoms: Secondary | ICD-10-CM | POA: Diagnosis not present

## 2013-08-26 DIAGNOSIS — C61 Malignant neoplasm of prostate: Secondary | ICD-10-CM | POA: Diagnosis not present

## 2013-08-26 DIAGNOSIS — N139 Obstructive and reflux uropathy, unspecified: Secondary | ICD-10-CM | POA: Diagnosis not present

## 2013-09-02 DIAGNOSIS — Z951 Presence of aortocoronary bypass graft: Secondary | ICD-10-CM | POA: Diagnosis not present

## 2013-09-02 DIAGNOSIS — Z7901 Long term (current) use of anticoagulants: Secondary | ICD-10-CM | POA: Diagnosis not present

## 2013-09-02 DIAGNOSIS — I5032 Chronic diastolic (congestive) heart failure: Secondary | ICD-10-CM | POA: Diagnosis not present

## 2013-09-02 DIAGNOSIS — I119 Hypertensive heart disease without heart failure: Secondary | ICD-10-CM | POA: Diagnosis not present

## 2013-09-02 DIAGNOSIS — N184 Chronic kidney disease, stage 4 (severe): Secondary | ICD-10-CM | POA: Diagnosis not present

## 2013-09-02 DIAGNOSIS — I4891 Unspecified atrial fibrillation: Secondary | ICD-10-CM | POA: Diagnosis not present

## 2013-09-02 DIAGNOSIS — I2510 Atherosclerotic heart disease of native coronary artery without angina pectoris: Secondary | ICD-10-CM | POA: Diagnosis not present

## 2013-09-02 DIAGNOSIS — Z79899 Other long term (current) drug therapy: Secondary | ICD-10-CM | POA: Diagnosis not present

## 2013-09-03 DIAGNOSIS — N401 Enlarged prostate with lower urinary tract symptoms: Secondary | ICD-10-CM | POA: Diagnosis not present

## 2013-09-03 DIAGNOSIS — N138 Other obstructive and reflux uropathy: Secondary | ICD-10-CM | POA: Diagnosis not present

## 2013-09-03 DIAGNOSIS — N139 Obstructive and reflux uropathy, unspecified: Secondary | ICD-10-CM | POA: Diagnosis not present

## 2013-09-03 DIAGNOSIS — C61 Malignant neoplasm of prostate: Secondary | ICD-10-CM | POA: Diagnosis not present

## 2013-09-03 DIAGNOSIS — R351 Nocturia: Secondary | ICD-10-CM | POA: Diagnosis not present

## 2013-09-08 DIAGNOSIS — I119 Hypertensive heart disease without heart failure: Secondary | ICD-10-CM | POA: Diagnosis not present

## 2013-09-08 DIAGNOSIS — N184 Chronic kidney disease, stage 4 (severe): Secondary | ICD-10-CM | POA: Diagnosis not present

## 2013-09-08 DIAGNOSIS — I2510 Atherosclerotic heart disease of native coronary artery without angina pectoris: Secondary | ICD-10-CM | POA: Diagnosis not present

## 2013-09-08 DIAGNOSIS — Z7901 Long term (current) use of anticoagulants: Secondary | ICD-10-CM | POA: Diagnosis not present

## 2013-09-08 DIAGNOSIS — Z951 Presence of aortocoronary bypass graft: Secondary | ICD-10-CM | POA: Diagnosis not present

## 2013-09-08 DIAGNOSIS — Z950 Presence of cardiac pacemaker: Secondary | ICD-10-CM | POA: Diagnosis not present

## 2013-09-08 DIAGNOSIS — I4891 Unspecified atrial fibrillation: Secondary | ICD-10-CM | POA: Diagnosis not present

## 2013-09-08 DIAGNOSIS — I5032 Chronic diastolic (congestive) heart failure: Secondary | ICD-10-CM | POA: Diagnosis not present

## 2013-09-13 DIAGNOSIS — N184 Chronic kidney disease, stage 4 (severe): Secondary | ICD-10-CM | POA: Diagnosis not present

## 2013-09-13 DIAGNOSIS — I129 Hypertensive chronic kidney disease with stage 1 through stage 4 chronic kidney disease, or unspecified chronic kidney disease: Secondary | ICD-10-CM | POA: Diagnosis not present

## 2013-09-13 DIAGNOSIS — E1129 Type 2 diabetes mellitus with other diabetic kidney complication: Secondary | ICD-10-CM | POA: Diagnosis not present

## 2013-09-17 DIAGNOSIS — C61 Malignant neoplasm of prostate: Secondary | ICD-10-CM | POA: Diagnosis not present

## 2013-09-17 DIAGNOSIS — N402 Nodular prostate without lower urinary tract symptoms: Secondary | ICD-10-CM | POA: Diagnosis not present

## 2013-09-22 DIAGNOSIS — I119 Hypertensive heart disease without heart failure: Secondary | ICD-10-CM | POA: Diagnosis not present

## 2013-09-22 DIAGNOSIS — Z7901 Long term (current) use of anticoagulants: Secondary | ICD-10-CM | POA: Diagnosis not present

## 2013-09-22 DIAGNOSIS — I4891 Unspecified atrial fibrillation: Secondary | ICD-10-CM | POA: Diagnosis not present

## 2013-09-22 DIAGNOSIS — I5032 Chronic diastolic (congestive) heart failure: Secondary | ICD-10-CM | POA: Diagnosis not present

## 2013-09-22 DIAGNOSIS — N184 Chronic kidney disease, stage 4 (severe): Secondary | ICD-10-CM | POA: Diagnosis not present

## 2013-09-22 DIAGNOSIS — Z951 Presence of aortocoronary bypass graft: Secondary | ICD-10-CM | POA: Diagnosis not present

## 2013-09-22 DIAGNOSIS — Z950 Presence of cardiac pacemaker: Secondary | ICD-10-CM | POA: Diagnosis not present

## 2013-09-22 DIAGNOSIS — I2510 Atherosclerotic heart disease of native coronary artery without angina pectoris: Secondary | ICD-10-CM | POA: Diagnosis not present

## 2013-09-24 DIAGNOSIS — I4891 Unspecified atrial fibrillation: Secondary | ICD-10-CM | POA: Diagnosis not present

## 2013-09-24 DIAGNOSIS — Z7901 Long term (current) use of anticoagulants: Secondary | ICD-10-CM | POA: Diagnosis not present

## 2013-09-24 DIAGNOSIS — Z79899 Other long term (current) drug therapy: Secondary | ICD-10-CM | POA: Diagnosis not present

## 2013-09-24 DIAGNOSIS — Z950 Presence of cardiac pacemaker: Secondary | ICD-10-CM | POA: Diagnosis not present

## 2013-09-24 DIAGNOSIS — I2510 Atherosclerotic heart disease of native coronary artery without angina pectoris: Secondary | ICD-10-CM | POA: Diagnosis not present

## 2013-09-24 DIAGNOSIS — I5032 Chronic diastolic (congestive) heart failure: Secondary | ICD-10-CM | POA: Diagnosis not present

## 2013-09-24 DIAGNOSIS — I119 Hypertensive heart disease without heart failure: Secondary | ICD-10-CM | POA: Diagnosis not present

## 2013-09-24 DIAGNOSIS — N184 Chronic kidney disease, stage 4 (severe): Secondary | ICD-10-CM | POA: Diagnosis not present

## 2013-09-24 DIAGNOSIS — Z951 Presence of aortocoronary bypass graft: Secondary | ICD-10-CM | POA: Diagnosis not present

## 2013-10-06 DIAGNOSIS — I119 Hypertensive heart disease without heart failure: Secondary | ICD-10-CM | POA: Diagnosis not present

## 2013-10-06 DIAGNOSIS — I5032 Chronic diastolic (congestive) heart failure: Secondary | ICD-10-CM | POA: Diagnosis not present

## 2013-10-06 DIAGNOSIS — I4891 Unspecified atrial fibrillation: Secondary | ICD-10-CM | POA: Diagnosis not present

## 2013-10-06 DIAGNOSIS — Z951 Presence of aortocoronary bypass graft: Secondary | ICD-10-CM | POA: Diagnosis not present

## 2013-10-06 DIAGNOSIS — I2510 Atherosclerotic heart disease of native coronary artery without angina pectoris: Secondary | ICD-10-CM | POA: Diagnosis not present

## 2013-10-06 DIAGNOSIS — Z950 Presence of cardiac pacemaker: Secondary | ICD-10-CM | POA: Diagnosis not present

## 2013-10-06 DIAGNOSIS — N184 Chronic kidney disease, stage 4 (severe): Secondary | ICD-10-CM | POA: Diagnosis not present

## 2013-10-06 DIAGNOSIS — Z7901 Long term (current) use of anticoagulants: Secondary | ICD-10-CM | POA: Diagnosis not present

## 2013-10-15 DIAGNOSIS — N184 Chronic kidney disease, stage 4 (severe): Secondary | ICD-10-CM | POA: Diagnosis not present

## 2013-10-15 DIAGNOSIS — C61 Malignant neoplasm of prostate: Secondary | ICD-10-CM | POA: Diagnosis not present

## 2013-10-15 DIAGNOSIS — M545 Low back pain, unspecified: Secondary | ICD-10-CM | POA: Diagnosis not present

## 2013-10-15 DIAGNOSIS — I10 Essential (primary) hypertension: Secondary | ICD-10-CM | POA: Diagnosis not present

## 2013-10-15 DIAGNOSIS — E785 Hyperlipidemia, unspecified: Secondary | ICD-10-CM | POA: Diagnosis not present

## 2013-10-15 DIAGNOSIS — Z6829 Body mass index (BMI) 29.0-29.9, adult: Secondary | ICD-10-CM | POA: Diagnosis not present

## 2013-10-15 DIAGNOSIS — E1129 Type 2 diabetes mellitus with other diabetic kidney complication: Secondary | ICD-10-CM | POA: Diagnosis not present

## 2013-10-20 DIAGNOSIS — Z950 Presence of cardiac pacemaker: Secondary | ICD-10-CM | POA: Diagnosis not present

## 2013-10-20 DIAGNOSIS — Z951 Presence of aortocoronary bypass graft: Secondary | ICD-10-CM | POA: Diagnosis not present

## 2013-10-20 DIAGNOSIS — I4891 Unspecified atrial fibrillation: Secondary | ICD-10-CM | POA: Diagnosis not present

## 2013-10-20 DIAGNOSIS — I5032 Chronic diastolic (congestive) heart failure: Secondary | ICD-10-CM | POA: Diagnosis not present

## 2013-10-20 DIAGNOSIS — Z7901 Long term (current) use of anticoagulants: Secondary | ICD-10-CM | POA: Diagnosis not present

## 2013-10-20 DIAGNOSIS — I119 Hypertensive heart disease without heart failure: Secondary | ICD-10-CM | POA: Diagnosis not present

## 2013-10-20 DIAGNOSIS — N184 Chronic kidney disease, stage 4 (severe): Secondary | ICD-10-CM | POA: Diagnosis not present

## 2013-10-20 DIAGNOSIS — I2510 Atherosclerotic heart disease of native coronary artery without angina pectoris: Secondary | ICD-10-CM | POA: Diagnosis not present

## 2013-11-17 DIAGNOSIS — I2510 Atherosclerotic heart disease of native coronary artery without angina pectoris: Secondary | ICD-10-CM | POA: Diagnosis not present

## 2013-11-17 DIAGNOSIS — N184 Chronic kidney disease, stage 4 (severe): Secondary | ICD-10-CM | POA: Diagnosis not present

## 2013-11-17 DIAGNOSIS — I119 Hypertensive heart disease without heart failure: Secondary | ICD-10-CM | POA: Diagnosis not present

## 2013-11-17 DIAGNOSIS — Z951 Presence of aortocoronary bypass graft: Secondary | ICD-10-CM | POA: Diagnosis not present

## 2013-11-17 DIAGNOSIS — I4891 Unspecified atrial fibrillation: Secondary | ICD-10-CM | POA: Diagnosis not present

## 2013-11-17 DIAGNOSIS — Z950 Presence of cardiac pacemaker: Secondary | ICD-10-CM | POA: Diagnosis not present

## 2013-11-17 DIAGNOSIS — Z7901 Long term (current) use of anticoagulants: Secondary | ICD-10-CM | POA: Diagnosis not present

## 2013-11-17 DIAGNOSIS — I5032 Chronic diastolic (congestive) heart failure: Secondary | ICD-10-CM | POA: Diagnosis not present

## 2013-11-18 DIAGNOSIS — E785 Hyperlipidemia, unspecified: Secondary | ICD-10-CM | POA: Diagnosis not present

## 2013-11-18 DIAGNOSIS — I129 Hypertensive chronic kidney disease with stage 1 through stage 4 chronic kidney disease, or unspecified chronic kidney disease: Secondary | ICD-10-CM | POA: Diagnosis not present

## 2013-11-18 DIAGNOSIS — N184 Chronic kidney disease, stage 4 (severe): Secondary | ICD-10-CM | POA: Diagnosis not present

## 2013-11-18 DIAGNOSIS — Z23 Encounter for immunization: Secondary | ICD-10-CM | POA: Diagnosis not present

## 2013-11-18 DIAGNOSIS — E1129 Type 2 diabetes mellitus with other diabetic kidney complication: Secondary | ICD-10-CM | POA: Diagnosis not present

## 2013-12-13 DIAGNOSIS — I2510 Atherosclerotic heart disease of native coronary artery without angina pectoris: Secondary | ICD-10-CM | POA: Diagnosis not present

## 2013-12-13 DIAGNOSIS — I480 Paroxysmal atrial fibrillation: Secondary | ICD-10-CM | POA: Diagnosis not present

## 2013-12-13 DIAGNOSIS — Z7901 Long term (current) use of anticoagulants: Secondary | ICD-10-CM | POA: Diagnosis not present

## 2013-12-13 DIAGNOSIS — I5032 Chronic diastolic (congestive) heart failure: Secondary | ICD-10-CM | POA: Diagnosis not present

## 2013-12-16 DIAGNOSIS — N402 Nodular prostate without lower urinary tract symptoms: Secondary | ICD-10-CM | POA: Diagnosis not present

## 2013-12-16 DIAGNOSIS — C61 Malignant neoplasm of prostate: Secondary | ICD-10-CM | POA: Diagnosis not present

## 2013-12-22 DIAGNOSIS — R3915 Urgency of urination: Secondary | ICD-10-CM | POA: Diagnosis not present

## 2013-12-22 DIAGNOSIS — R3912 Poor urinary stream: Secondary | ICD-10-CM | POA: Diagnosis not present

## 2013-12-22 DIAGNOSIS — C61 Malignant neoplasm of prostate: Secondary | ICD-10-CM | POA: Diagnosis not present

## 2013-12-22 DIAGNOSIS — N401 Enlarged prostate with lower urinary tract symptoms: Secondary | ICD-10-CM | POA: Diagnosis not present

## 2013-12-22 DIAGNOSIS — R350 Frequency of micturition: Secondary | ICD-10-CM | POA: Diagnosis not present

## 2014-01-12 DIAGNOSIS — N184 Chronic kidney disease, stage 4 (severe): Secondary | ICD-10-CM | POA: Diagnosis not present

## 2014-01-12 DIAGNOSIS — I480 Paroxysmal atrial fibrillation: Secondary | ICD-10-CM | POA: Diagnosis not present

## 2014-01-12 DIAGNOSIS — E1122 Type 2 diabetes mellitus with diabetic chronic kidney disease: Secondary | ICD-10-CM | POA: Diagnosis not present

## 2014-01-12 DIAGNOSIS — I119 Hypertensive heart disease without heart failure: Secondary | ICD-10-CM | POA: Diagnosis not present

## 2014-01-12 DIAGNOSIS — Z951 Presence of aortocoronary bypass graft: Secondary | ICD-10-CM | POA: Diagnosis not present

## 2014-01-12 DIAGNOSIS — I5032 Chronic diastolic (congestive) heart failure: Secondary | ICD-10-CM | POA: Diagnosis not present

## 2014-01-12 DIAGNOSIS — I2510 Atherosclerotic heart disease of native coronary artery without angina pectoris: Secondary | ICD-10-CM | POA: Diagnosis not present

## 2014-01-12 DIAGNOSIS — Z7901 Long term (current) use of anticoagulants: Secondary | ICD-10-CM | POA: Diagnosis not present

## 2014-01-20 DIAGNOSIS — E785 Hyperlipidemia, unspecified: Secondary | ICD-10-CM | POA: Diagnosis not present

## 2014-01-20 DIAGNOSIS — E1129 Type 2 diabetes mellitus with other diabetic kidney complication: Secondary | ICD-10-CM | POA: Diagnosis not present

## 2014-01-20 DIAGNOSIS — Z125 Encounter for screening for malignant neoplasm of prostate: Secondary | ICD-10-CM | POA: Diagnosis not present

## 2014-01-20 DIAGNOSIS — R8299 Other abnormal findings in urine: Secondary | ICD-10-CM | POA: Diagnosis not present

## 2014-01-20 DIAGNOSIS — I2510 Atherosclerotic heart disease of native coronary artery without angina pectoris: Secondary | ICD-10-CM | POA: Diagnosis not present

## 2014-01-25 DIAGNOSIS — E1129 Type 2 diabetes mellitus with other diabetic kidney complication: Secondary | ICD-10-CM | POA: Diagnosis not present

## 2014-01-25 DIAGNOSIS — N184 Chronic kidney disease, stage 4 (severe): Secondary | ICD-10-CM | POA: Diagnosis not present

## 2014-01-25 DIAGNOSIS — I129 Hypertensive chronic kidney disease with stage 1 through stage 4 chronic kidney disease, or unspecified chronic kidney disease: Secondary | ICD-10-CM | POA: Diagnosis not present

## 2014-01-25 DIAGNOSIS — E785 Hyperlipidemia, unspecified: Secondary | ICD-10-CM | POA: Diagnosis not present

## 2014-01-26 DIAGNOSIS — I480 Paroxysmal atrial fibrillation: Secondary | ICD-10-CM | POA: Diagnosis not present

## 2014-01-26 DIAGNOSIS — I5032 Chronic diastolic (congestive) heart failure: Secondary | ICD-10-CM | POA: Diagnosis not present

## 2014-01-26 DIAGNOSIS — Z951 Presence of aortocoronary bypass graft: Secondary | ICD-10-CM | POA: Diagnosis not present

## 2014-01-26 DIAGNOSIS — I119 Hypertensive heart disease without heart failure: Secondary | ICD-10-CM | POA: Diagnosis not present

## 2014-01-26 DIAGNOSIS — Z7901 Long term (current) use of anticoagulants: Secondary | ICD-10-CM | POA: Diagnosis not present

## 2014-01-26 DIAGNOSIS — N184 Chronic kidney disease, stage 4 (severe): Secondary | ICD-10-CM | POA: Diagnosis not present

## 2014-01-26 DIAGNOSIS — E1122 Type 2 diabetes mellitus with diabetic chronic kidney disease: Secondary | ICD-10-CM | POA: Diagnosis not present

## 2014-01-26 DIAGNOSIS — I2510 Atherosclerotic heart disease of native coronary artery without angina pectoris: Secondary | ICD-10-CM | POA: Diagnosis not present

## 2014-01-27 DIAGNOSIS — I480 Paroxysmal atrial fibrillation: Secondary | ICD-10-CM | POA: Diagnosis not present

## 2014-01-27 DIAGNOSIS — Z7901 Long term (current) use of anticoagulants: Secondary | ICD-10-CM | POA: Diagnosis not present

## 2014-01-27 DIAGNOSIS — N184 Chronic kidney disease, stage 4 (severe): Secondary | ICD-10-CM | POA: Diagnosis not present

## 2014-01-27 DIAGNOSIS — Z008 Encounter for other general examination: Secondary | ICD-10-CM | POA: Diagnosis not present

## 2014-01-27 DIAGNOSIS — I5033 Acute on chronic diastolic (congestive) heart failure: Secondary | ICD-10-CM | POA: Diagnosis not present

## 2014-01-27 DIAGNOSIS — E1129 Type 2 diabetes mellitus with other diabetic kidney complication: Secondary | ICD-10-CM | POA: Diagnosis not present

## 2014-01-27 DIAGNOSIS — C61 Malignant neoplasm of prostate: Secondary | ICD-10-CM | POA: Diagnosis not present

## 2014-01-27 DIAGNOSIS — Z23 Encounter for immunization: Secondary | ICD-10-CM | POA: Diagnosis not present

## 2014-01-27 DIAGNOSIS — I25810 Atherosclerosis of coronary artery bypass graft(s) without angina pectoris: Secondary | ICD-10-CM | POA: Diagnosis not present

## 2014-02-04 DIAGNOSIS — Z1212 Encounter for screening for malignant neoplasm of rectum: Secondary | ICD-10-CM | POA: Diagnosis not present

## 2014-02-09 DIAGNOSIS — I2510 Atherosclerotic heart disease of native coronary artery without angina pectoris: Secondary | ICD-10-CM | POA: Diagnosis not present

## 2014-02-09 DIAGNOSIS — N184 Chronic kidney disease, stage 4 (severe): Secondary | ICD-10-CM | POA: Diagnosis not present

## 2014-02-09 DIAGNOSIS — I5032 Chronic diastolic (congestive) heart failure: Secondary | ICD-10-CM | POA: Diagnosis not present

## 2014-02-09 DIAGNOSIS — E1122 Type 2 diabetes mellitus with diabetic chronic kidney disease: Secondary | ICD-10-CM | POA: Diagnosis not present

## 2014-02-09 DIAGNOSIS — I480 Paroxysmal atrial fibrillation: Secondary | ICD-10-CM | POA: Diagnosis not present

## 2014-02-09 DIAGNOSIS — Z951 Presence of aortocoronary bypass graft: Secondary | ICD-10-CM | POA: Diagnosis not present

## 2014-02-09 DIAGNOSIS — Z950 Presence of cardiac pacemaker: Secondary | ICD-10-CM | POA: Diagnosis not present

## 2014-02-09 DIAGNOSIS — Z7901 Long term (current) use of anticoagulants: Secondary | ICD-10-CM | POA: Diagnosis not present

## 2014-02-09 DIAGNOSIS — I119 Hypertensive heart disease without heart failure: Secondary | ICD-10-CM | POA: Diagnosis not present

## 2014-02-23 DIAGNOSIS — Z7901 Long term (current) use of anticoagulants: Secondary | ICD-10-CM | POA: Diagnosis not present

## 2014-02-23 DIAGNOSIS — I119 Hypertensive heart disease without heart failure: Secondary | ICD-10-CM | POA: Diagnosis not present

## 2014-02-23 DIAGNOSIS — I2510 Atherosclerotic heart disease of native coronary artery without angina pectoris: Secondary | ICD-10-CM | POA: Diagnosis not present

## 2014-02-23 DIAGNOSIS — N184 Chronic kidney disease, stage 4 (severe): Secondary | ICD-10-CM | POA: Diagnosis not present

## 2014-02-23 DIAGNOSIS — Z951 Presence of aortocoronary bypass graft: Secondary | ICD-10-CM | POA: Diagnosis not present

## 2014-02-23 DIAGNOSIS — I480 Paroxysmal atrial fibrillation: Secondary | ICD-10-CM | POA: Diagnosis not present

## 2014-02-23 DIAGNOSIS — E1122 Type 2 diabetes mellitus with diabetic chronic kidney disease: Secondary | ICD-10-CM | POA: Diagnosis not present

## 2014-02-23 DIAGNOSIS — I5032 Chronic diastolic (congestive) heart failure: Secondary | ICD-10-CM | POA: Diagnosis not present

## 2014-03-09 DIAGNOSIS — I2510 Atherosclerotic heart disease of native coronary artery without angina pectoris: Secondary | ICD-10-CM | POA: Diagnosis not present

## 2014-03-09 DIAGNOSIS — Z7901 Long term (current) use of anticoagulants: Secondary | ICD-10-CM | POA: Diagnosis not present

## 2014-03-09 DIAGNOSIS — I480 Paroxysmal atrial fibrillation: Secondary | ICD-10-CM | POA: Diagnosis not present

## 2014-03-09 DIAGNOSIS — E1122 Type 2 diabetes mellitus with diabetic chronic kidney disease: Secondary | ICD-10-CM | POA: Diagnosis not present

## 2014-03-09 DIAGNOSIS — N184 Chronic kidney disease, stage 4 (severe): Secondary | ICD-10-CM | POA: Diagnosis not present

## 2014-03-09 DIAGNOSIS — I5032 Chronic diastolic (congestive) heart failure: Secondary | ICD-10-CM | POA: Diagnosis not present

## 2014-03-09 DIAGNOSIS — I119 Hypertensive heart disease without heart failure: Secondary | ICD-10-CM | POA: Diagnosis not present

## 2014-03-09 DIAGNOSIS — Z951 Presence of aortocoronary bypass graft: Secondary | ICD-10-CM | POA: Diagnosis not present

## 2014-03-30 DIAGNOSIS — D631 Anemia in chronic kidney disease: Secondary | ICD-10-CM | POA: Diagnosis not present

## 2014-03-30 DIAGNOSIS — N2581 Secondary hyperparathyroidism of renal origin: Secondary | ICD-10-CM | POA: Diagnosis not present

## 2014-03-30 DIAGNOSIS — N189 Chronic kidney disease, unspecified: Secondary | ICD-10-CM | POA: Diagnosis not present

## 2014-03-30 DIAGNOSIS — I129 Hypertensive chronic kidney disease with stage 1 through stage 4 chronic kidney disease, or unspecified chronic kidney disease: Secondary | ICD-10-CM | POA: Diagnosis not present

## 2014-03-30 DIAGNOSIS — N184 Chronic kidney disease, stage 4 (severe): Secondary | ICD-10-CM | POA: Diagnosis not present

## 2014-04-08 DIAGNOSIS — I2510 Atherosclerotic heart disease of native coronary artery without angina pectoris: Secondary | ICD-10-CM | POA: Diagnosis not present

## 2014-04-08 DIAGNOSIS — I5032 Chronic diastolic (congestive) heart failure: Secondary | ICD-10-CM | POA: Diagnosis not present

## 2014-04-08 DIAGNOSIS — E1122 Type 2 diabetes mellitus with diabetic chronic kidney disease: Secondary | ICD-10-CM | POA: Diagnosis not present

## 2014-04-08 DIAGNOSIS — Z951 Presence of aortocoronary bypass graft: Secondary | ICD-10-CM | POA: Diagnosis not present

## 2014-04-08 DIAGNOSIS — N184 Chronic kidney disease, stage 4 (severe): Secondary | ICD-10-CM | POA: Diagnosis not present

## 2014-04-08 DIAGNOSIS — Z950 Presence of cardiac pacemaker: Secondary | ICD-10-CM | POA: Diagnosis not present

## 2014-04-08 DIAGNOSIS — I119 Hypertensive heart disease without heart failure: Secondary | ICD-10-CM | POA: Diagnosis not present

## 2014-04-08 DIAGNOSIS — I480 Paroxysmal atrial fibrillation: Secondary | ICD-10-CM | POA: Diagnosis not present

## 2014-04-08 DIAGNOSIS — Z79899 Other long term (current) drug therapy: Secondary | ICD-10-CM | POA: Diagnosis not present

## 2014-04-08 DIAGNOSIS — Z7901 Long term (current) use of anticoagulants: Secondary | ICD-10-CM | POA: Diagnosis not present

## 2014-04-21 DIAGNOSIS — C61 Malignant neoplasm of prostate: Secondary | ICD-10-CM | POA: Diagnosis not present

## 2014-04-29 DIAGNOSIS — C61 Malignant neoplasm of prostate: Secondary | ICD-10-CM | POA: Diagnosis not present

## 2014-04-29 DIAGNOSIS — R3916 Straining to void: Secondary | ICD-10-CM | POA: Diagnosis not present

## 2014-04-29 DIAGNOSIS — N401 Enlarged prostate with lower urinary tract symptoms: Secondary | ICD-10-CM | POA: Diagnosis not present

## 2014-05-05 DIAGNOSIS — E1122 Type 2 diabetes mellitus with diabetic chronic kidney disease: Secondary | ICD-10-CM | POA: Diagnosis not present

## 2014-05-05 DIAGNOSIS — I5032 Chronic diastolic (congestive) heart failure: Secondary | ICD-10-CM | POA: Diagnosis not present

## 2014-05-05 DIAGNOSIS — I2510 Atherosclerotic heart disease of native coronary artery without angina pectoris: Secondary | ICD-10-CM | POA: Diagnosis not present

## 2014-05-05 DIAGNOSIS — Z79899 Other long term (current) drug therapy: Secondary | ICD-10-CM | POA: Diagnosis not present

## 2014-05-05 DIAGNOSIS — Z950 Presence of cardiac pacemaker: Secondary | ICD-10-CM | POA: Diagnosis not present

## 2014-05-05 DIAGNOSIS — N184 Chronic kidney disease, stage 4 (severe): Secondary | ICD-10-CM | POA: Diagnosis not present

## 2014-05-05 DIAGNOSIS — Z7901 Long term (current) use of anticoagulants: Secondary | ICD-10-CM | POA: Diagnosis not present

## 2014-05-05 DIAGNOSIS — Z951 Presence of aortocoronary bypass graft: Secondary | ICD-10-CM | POA: Diagnosis not present

## 2014-05-05 DIAGNOSIS — I119 Hypertensive heart disease without heart failure: Secondary | ICD-10-CM | POA: Diagnosis not present

## 2014-05-05 DIAGNOSIS — I480 Paroxysmal atrial fibrillation: Secondary | ICD-10-CM | POA: Diagnosis not present

## 2014-05-18 DIAGNOSIS — Z7901 Long term (current) use of anticoagulants: Secondary | ICD-10-CM | POA: Diagnosis not present

## 2014-05-18 DIAGNOSIS — Z951 Presence of aortocoronary bypass graft: Secondary | ICD-10-CM | POA: Diagnosis not present

## 2014-05-18 DIAGNOSIS — N184 Chronic kidney disease, stage 4 (severe): Secondary | ICD-10-CM | POA: Diagnosis not present

## 2014-05-18 DIAGNOSIS — I480 Paroxysmal atrial fibrillation: Secondary | ICD-10-CM | POA: Diagnosis not present

## 2014-05-18 DIAGNOSIS — Z79899 Other long term (current) drug therapy: Secondary | ICD-10-CM | POA: Diagnosis not present

## 2014-05-18 DIAGNOSIS — I2510 Atherosclerotic heart disease of native coronary artery without angina pectoris: Secondary | ICD-10-CM | POA: Diagnosis not present

## 2014-05-18 DIAGNOSIS — I5032 Chronic diastolic (congestive) heart failure: Secondary | ICD-10-CM | POA: Diagnosis not present

## 2014-05-18 DIAGNOSIS — E1122 Type 2 diabetes mellitus with diabetic chronic kidney disease: Secondary | ICD-10-CM | POA: Diagnosis not present

## 2014-05-18 DIAGNOSIS — I119 Hypertensive heart disease without heart failure: Secondary | ICD-10-CM | POA: Diagnosis not present

## 2014-05-18 DIAGNOSIS — Z950 Presence of cardiac pacemaker: Secondary | ICD-10-CM | POA: Diagnosis not present

## 2014-05-19 DIAGNOSIS — I2510 Atherosclerotic heart disease of native coronary artery without angina pectoris: Secondary | ICD-10-CM | POA: Diagnosis not present

## 2014-05-19 DIAGNOSIS — E1122 Type 2 diabetes mellitus with diabetic chronic kidney disease: Secondary | ICD-10-CM | POA: Diagnosis not present

## 2014-05-19 DIAGNOSIS — I480 Paroxysmal atrial fibrillation: Secondary | ICD-10-CM | POA: Diagnosis not present

## 2014-05-19 DIAGNOSIS — N184 Chronic kidney disease, stage 4 (severe): Secondary | ICD-10-CM | POA: Diagnosis not present

## 2014-05-19 DIAGNOSIS — Z7901 Long term (current) use of anticoagulants: Secondary | ICD-10-CM | POA: Diagnosis not present

## 2014-05-19 DIAGNOSIS — Z951 Presence of aortocoronary bypass graft: Secondary | ICD-10-CM | POA: Diagnosis not present

## 2014-05-19 DIAGNOSIS — I119 Hypertensive heart disease without heart failure: Secondary | ICD-10-CM | POA: Diagnosis not present

## 2014-05-19 DIAGNOSIS — Z79899 Other long term (current) drug therapy: Secondary | ICD-10-CM | POA: Diagnosis not present

## 2014-05-19 DIAGNOSIS — I5032 Chronic diastolic (congestive) heart failure: Secondary | ICD-10-CM | POA: Diagnosis not present

## 2014-05-19 DIAGNOSIS — Z950 Presence of cardiac pacemaker: Secondary | ICD-10-CM | POA: Diagnosis not present

## 2014-05-31 DIAGNOSIS — C61 Malignant neoplasm of prostate: Secondary | ICD-10-CM | POA: Diagnosis not present

## 2014-05-31 DIAGNOSIS — E1129 Type 2 diabetes mellitus with other diabetic kidney complication: Secondary | ICD-10-CM | POA: Diagnosis not present

## 2014-05-31 DIAGNOSIS — Z6831 Body mass index (BMI) 31.0-31.9, adult: Secondary | ICD-10-CM | POA: Diagnosis not present

## 2014-05-31 DIAGNOSIS — I25810 Atherosclerosis of coronary artery bypass graft(s) without angina pectoris: Secondary | ICD-10-CM | POA: Diagnosis not present

## 2014-05-31 DIAGNOSIS — N184 Chronic kidney disease, stage 4 (severe): Secondary | ICD-10-CM | POA: Diagnosis not present

## 2014-06-02 DIAGNOSIS — E1122 Type 2 diabetes mellitus with diabetic chronic kidney disease: Secondary | ICD-10-CM | POA: Diagnosis not present

## 2014-06-02 DIAGNOSIS — Z950 Presence of cardiac pacemaker: Secondary | ICD-10-CM | POA: Diagnosis not present

## 2014-06-02 DIAGNOSIS — Z79899 Other long term (current) drug therapy: Secondary | ICD-10-CM | POA: Diagnosis not present

## 2014-06-02 DIAGNOSIS — Z7901 Long term (current) use of anticoagulants: Secondary | ICD-10-CM | POA: Diagnosis not present

## 2014-06-02 DIAGNOSIS — I2510 Atherosclerotic heart disease of native coronary artery without angina pectoris: Secondary | ICD-10-CM | POA: Diagnosis not present

## 2014-06-02 DIAGNOSIS — Z951 Presence of aortocoronary bypass graft: Secondary | ICD-10-CM | POA: Diagnosis not present

## 2014-06-02 DIAGNOSIS — I5032 Chronic diastolic (congestive) heart failure: Secondary | ICD-10-CM | POA: Diagnosis not present

## 2014-06-02 DIAGNOSIS — N184 Chronic kidney disease, stage 4 (severe): Secondary | ICD-10-CM | POA: Diagnosis not present

## 2014-06-02 DIAGNOSIS — I480 Paroxysmal atrial fibrillation: Secondary | ICD-10-CM | POA: Diagnosis not present

## 2014-06-02 DIAGNOSIS — I119 Hypertensive heart disease without heart failure: Secondary | ICD-10-CM | POA: Diagnosis not present

## 2014-06-08 DIAGNOSIS — N184 Chronic kidney disease, stage 4 (severe): Secondary | ICD-10-CM | POA: Diagnosis not present

## 2014-06-08 DIAGNOSIS — N2581 Secondary hyperparathyroidism of renal origin: Secondary | ICD-10-CM | POA: Diagnosis not present

## 2014-06-08 DIAGNOSIS — D631 Anemia in chronic kidney disease: Secondary | ICD-10-CM | POA: Diagnosis not present

## 2014-06-08 DIAGNOSIS — I129 Hypertensive chronic kidney disease with stage 1 through stage 4 chronic kidney disease, or unspecified chronic kidney disease: Secondary | ICD-10-CM | POA: Diagnosis not present

## 2014-06-15 DIAGNOSIS — Z950 Presence of cardiac pacemaker: Secondary | ICD-10-CM | POA: Diagnosis not present

## 2014-06-15 DIAGNOSIS — I5032 Chronic diastolic (congestive) heart failure: Secondary | ICD-10-CM | POA: Diagnosis not present

## 2014-06-15 DIAGNOSIS — I119 Hypertensive heart disease without heart failure: Secondary | ICD-10-CM | POA: Diagnosis not present

## 2014-06-15 DIAGNOSIS — I2510 Atherosclerotic heart disease of native coronary artery without angina pectoris: Secondary | ICD-10-CM | POA: Diagnosis not present

## 2014-06-15 DIAGNOSIS — I480 Paroxysmal atrial fibrillation: Secondary | ICD-10-CM | POA: Diagnosis not present

## 2014-06-15 DIAGNOSIS — N184 Chronic kidney disease, stage 4 (severe): Secondary | ICD-10-CM | POA: Diagnosis not present

## 2014-06-15 DIAGNOSIS — E1122 Type 2 diabetes mellitus with diabetic chronic kidney disease: Secondary | ICD-10-CM | POA: Diagnosis not present

## 2014-06-15 DIAGNOSIS — Z79899 Other long term (current) drug therapy: Secondary | ICD-10-CM | POA: Diagnosis not present

## 2014-06-15 DIAGNOSIS — Z7901 Long term (current) use of anticoagulants: Secondary | ICD-10-CM | POA: Diagnosis not present

## 2014-06-15 DIAGNOSIS — Z951 Presence of aortocoronary bypass graft: Secondary | ICD-10-CM | POA: Diagnosis not present

## 2014-06-28 ENCOUNTER — Inpatient Hospital Stay (HOSPITAL_COMMUNITY)
Admission: EM | Admit: 2014-06-28 | Discharge: 2014-07-04 | DRG: 481 | Disposition: A | Discharge status: Rehabilitation Facility | Payer: Medicare Other | Attending: Internal Medicine | Admitting: Internal Medicine

## 2014-06-28 ENCOUNTER — Other Ambulatory Visit (HOSPITAL_COMMUNITY): Payer: Self-pay

## 2014-06-28 ENCOUNTER — Encounter (HOSPITAL_COMMUNITY): Payer: Self-pay

## 2014-06-28 ENCOUNTER — Emergency Department (HOSPITAL_COMMUNITY): Payer: Medicare Other

## 2014-06-28 DIAGNOSIS — E1122 Type 2 diabetes mellitus with diabetic chronic kidney disease: Secondary | ICD-10-CM | POA: Diagnosis not present

## 2014-06-28 DIAGNOSIS — N179 Acute kidney failure, unspecified: Secondary | ICD-10-CM | POA: Diagnosis present

## 2014-06-28 DIAGNOSIS — D72829 Elevated white blood cell count, unspecified: Secondary | ICD-10-CM | POA: Diagnosis present

## 2014-06-28 DIAGNOSIS — Z950 Presence of cardiac pacemaker: Secondary | ICD-10-CM | POA: Diagnosis present

## 2014-06-28 DIAGNOSIS — M25559 Pain in unspecified hip: Secondary | ICD-10-CM | POA: Diagnosis not present

## 2014-06-28 DIAGNOSIS — N400 Enlarged prostate without lower urinary tract symptoms: Secondary | ICD-10-CM | POA: Diagnosis present

## 2014-06-28 DIAGNOSIS — I129 Hypertensive chronic kidney disease with stage 1 through stage 4 chronic kidney disease, or unspecified chronic kidney disease: Secondary | ICD-10-CM | POA: Diagnosis present

## 2014-06-28 DIAGNOSIS — I482 Chronic atrial fibrillation: Secondary | ICD-10-CM | POA: Diagnosis not present

## 2014-06-28 DIAGNOSIS — M810 Age-related osteoporosis without current pathological fracture: Secondary | ICD-10-CM | POA: Diagnosis present

## 2014-06-28 DIAGNOSIS — I4510 Unspecified right bundle-branch block: Secondary | ICD-10-CM | POA: Diagnosis present

## 2014-06-28 DIAGNOSIS — S72001A Fracture of unspecified part of neck of right femur, initial encounter for closed fracture: Secondary | ICD-10-CM | POA: Diagnosis present

## 2014-06-28 DIAGNOSIS — W19XXXA Unspecified fall, initial encounter: Secondary | ICD-10-CM | POA: Diagnosis present

## 2014-06-28 DIAGNOSIS — I5032 Chronic diastolic (congestive) heart failure: Secondary | ICD-10-CM | POA: Diagnosis not present

## 2014-06-28 DIAGNOSIS — E785 Hyperlipidemia, unspecified: Secondary | ICD-10-CM | POA: Diagnosis present

## 2014-06-28 DIAGNOSIS — R42 Dizziness and giddiness: Secondary | ICD-10-CM | POA: Diagnosis not present

## 2014-06-28 DIAGNOSIS — I5030 Unspecified diastolic (congestive) heart failure: Secondary | ICD-10-CM

## 2014-06-28 DIAGNOSIS — J986 Disorders of diaphragm: Secondary | ICD-10-CM | POA: Diagnosis not present

## 2014-06-28 DIAGNOSIS — M519 Unspecified thoracic, thoracolumbar and lumbosacral intervertebral disc disorder: Secondary | ICD-10-CM | POA: Diagnosis present

## 2014-06-28 DIAGNOSIS — Z85828 Personal history of other malignant neoplasm of skin: Secondary | ICD-10-CM | POA: Diagnosis not present

## 2014-06-28 DIAGNOSIS — N185 Chronic kidney disease, stage 5: Secondary | ICD-10-CM | POA: Diagnosis present

## 2014-06-28 DIAGNOSIS — Z951 Presence of aortocoronary bypass graft: Secondary | ICD-10-CM

## 2014-06-28 DIAGNOSIS — I2510 Atherosclerotic heart disease of native coronary artery without angina pectoris: Secondary | ICD-10-CM | POA: Diagnosis present

## 2014-06-28 DIAGNOSIS — I480 Paroxysmal atrial fibrillation: Secondary | ICD-10-CM | POA: Diagnosis not present

## 2014-06-28 DIAGNOSIS — M25551 Pain in right hip: Secondary | ICD-10-CM | POA: Diagnosis not present

## 2014-06-28 DIAGNOSIS — N183 Chronic kidney disease, stage 3 (moderate): Secondary | ICD-10-CM | POA: Diagnosis not present

## 2014-06-28 DIAGNOSIS — S72142A Displaced intertrochanteric fracture of left femur, initial encounter for closed fracture: Secondary | ICD-10-CM | POA: Diagnosis not present

## 2014-06-28 DIAGNOSIS — E1129 Type 2 diabetes mellitus with other diabetic kidney complication: Secondary | ICD-10-CM | POA: Diagnosis present

## 2014-06-28 DIAGNOSIS — S72044A Nondisplaced fracture of base of neck of right femur, initial encounter for closed fracture: Secondary | ICD-10-CM | POA: Diagnosis not present

## 2014-06-28 DIAGNOSIS — S72009A Fracture of unspecified part of neck of unspecified femur, initial encounter for closed fracture: Secondary | ICD-10-CM | POA: Diagnosis present

## 2014-06-28 DIAGNOSIS — S72044D Nondisplaced fracture of base of neck of right femur, subsequent encounter for closed fracture with routine healing: Secondary | ICD-10-CM | POA: Diagnosis not present

## 2014-06-28 DIAGNOSIS — T148 Other injury of unspecified body region: Secondary | ICD-10-CM | POA: Diagnosis not present

## 2014-06-28 DIAGNOSIS — N184 Chronic kidney disease, stage 4 (severe): Secondary | ICD-10-CM | POA: Diagnosis present

## 2014-06-28 DIAGNOSIS — E1121 Type 2 diabetes mellitus with diabetic nephropathy: Secondary | ICD-10-CM | POA: Diagnosis not present

## 2014-06-28 DIAGNOSIS — M1990 Unspecified osteoarthritis, unspecified site: Secondary | ICD-10-CM | POA: Diagnosis not present

## 2014-06-28 DIAGNOSIS — Z87891 Personal history of nicotine dependence: Secondary | ICD-10-CM | POA: Diagnosis not present

## 2014-06-28 DIAGNOSIS — S0990XA Unspecified injury of head, initial encounter: Secondary | ICD-10-CM | POA: Diagnosis not present

## 2014-06-28 DIAGNOSIS — Z7901 Long term (current) use of anticoagulants: Secondary | ICD-10-CM

## 2014-06-28 DIAGNOSIS — I4891 Unspecified atrial fibrillation: Secondary | ICD-10-CM | POA: Diagnosis not present

## 2014-06-28 DIAGNOSIS — E1142 Type 2 diabetes mellitus with diabetic polyneuropathy: Secondary | ICD-10-CM | POA: Diagnosis present

## 2014-06-28 DIAGNOSIS — T502X5A Adverse effect of carbonic-anhydrase inhibitors, benzothiadiazides and other diuretics, initial encounter: Secondary | ICD-10-CM | POA: Diagnosis not present

## 2014-06-28 DIAGNOSIS — S72001S Fracture of unspecified part of neck of right femur, sequela: Secondary | ICD-10-CM | POA: Diagnosis not present

## 2014-06-28 DIAGNOSIS — Z419 Encounter for procedure for purposes other than remedying health state, unspecified: Secondary | ICD-10-CM

## 2014-06-28 DIAGNOSIS — I119 Hypertensive heart disease without heart failure: Secondary | ICD-10-CM | POA: Diagnosis not present

## 2014-06-28 DIAGNOSIS — R55 Syncope and collapse: Secondary | ICD-10-CM | POA: Diagnosis not present

## 2014-06-28 LAB — TYPE AND SCREEN
ABO/RH(D): O NEG
ANTIBODY SCREEN: NEGATIVE

## 2014-06-28 LAB — CBC WITH DIFFERENTIAL/PLATELET
BASOS ABS: 0 10*3/uL (ref 0.0–0.1)
Basophils Relative: 0 % (ref 0–1)
Eosinophils Absolute: 0 10*3/uL (ref 0.0–0.7)
Eosinophils Relative: 0 % (ref 0–5)
HCT: 34.2 % — ABNORMAL LOW (ref 39.0–52.0)
Hemoglobin: 10.8 g/dL — ABNORMAL LOW (ref 13.0–17.0)
Lymphocytes Relative: 9 % — ABNORMAL LOW (ref 12–46)
Lymphs Abs: 1.3 10*3/uL (ref 0.7–4.0)
MCH: 28.5 pg (ref 26.0–34.0)
MCHC: 31.6 g/dL (ref 30.0–36.0)
MCV: 90.2 fL (ref 78.0–100.0)
MONO ABS: 0.6 10*3/uL (ref 0.1–1.0)
Monocytes Relative: 4 % (ref 3–12)
NEUTROS ABS: 12.6 10*3/uL — AB (ref 1.7–7.7)
Neutrophils Relative %: 87 % — ABNORMAL HIGH (ref 43–77)
Platelets: 382 10*3/uL (ref 150–400)
RBC: 3.79 MIL/uL — AB (ref 4.22–5.81)
RDW: 16.9 % — ABNORMAL HIGH (ref 11.5–15.5)
WBC: 14.5 10*3/uL — ABNORMAL HIGH (ref 4.0–10.5)

## 2014-06-28 LAB — BASIC METABOLIC PANEL
Anion gap: 11 (ref 5–15)
BUN: 75 mg/dL — AB (ref 6–23)
CHLORIDE: 105 mmol/L (ref 96–112)
CO2: 24 mmol/L (ref 19–32)
Calcium: 8.9 mg/dL (ref 8.4–10.5)
Creatinine, Ser: 3.62 mg/dL — ABNORMAL HIGH (ref 0.50–1.35)
GFR calc non Af Amer: 14 mL/min — ABNORMAL LOW (ref >90)
GFR, EST AFRICAN AMERICAN: 16 mL/min — AB (ref >90)
Glucose, Bld: 98 mg/dL (ref 70–99)
POTASSIUM: 4.3 mmol/L (ref 3.5–5.1)
SODIUM: 140 mmol/L (ref 135–145)

## 2014-06-28 LAB — PROTIME-INR
INR: 1.66 — AB (ref 0.00–1.49)
Prothrombin Time: 19.7 seconds — ABNORMAL HIGH (ref 11.6–15.2)

## 2014-06-28 LAB — ABO/RH: ABO/RH(D): O NEG

## 2014-06-28 LAB — CREATININE, URINE, RANDOM: CREATININE, URINE: 90.98 mg/dL

## 2014-06-28 LAB — CBG MONITORING, ED: Glucose-Capillary: 91 mg/dL (ref 70–99)

## 2014-06-28 LAB — GLUCOSE, CAPILLARY: GLUCOSE-CAPILLARY: 196 mg/dL — AB (ref 70–99)

## 2014-06-28 LAB — APTT: aPTT: 35 seconds (ref 24–37)

## 2014-06-28 MED ORDER — HYDROCODONE-ACETAMINOPHEN 5-325 MG PO TABS
1.0000 | ORAL_TABLET | ORAL | Status: DC | PRN
  Administered 2014-06-28: 1 via ORAL
  Filled 2014-06-28: qty 1

## 2014-06-28 MED ORDER — ATENOLOL 50 MG PO TABS
25.0000 mg | ORAL_TABLET | Freq: Every morning | ORAL | Status: DC
  Administered 2014-06-29 – 2014-07-03 (×5): 25 mg via ORAL
  Filled 2014-06-28 (×8): qty 1

## 2014-06-28 MED ORDER — ADULT MULTIVITAMIN W/MINERALS CH
1.0000 | ORAL_TABLET | Freq: Every day | ORAL | Status: DC
  Administered 2014-06-29 – 2014-07-04 (×5): 1 via ORAL
  Filled 2014-06-28 (×5): qty 1

## 2014-06-28 MED ORDER — IBUPROFEN 200 MG PO TABS
400.0000 mg | ORAL_TABLET | Freq: Four times a day (QID) | ORAL | Status: DC | PRN
  Filled 2014-06-28: qty 2

## 2014-06-28 MED ORDER — TETANUS-DIPHTH-ACELL PERTUSSIS 5-2.5-18.5 LF-MCG/0.5 IM SUSP
0.5000 mL | Freq: Once | INTRAMUSCULAR | Status: AC
  Administered 2014-06-28: 0.5 mL via INTRAMUSCULAR
  Filled 2014-06-28: qty 0.5

## 2014-06-28 MED ORDER — FINASTERIDE 5 MG PO TABS
5.0000 mg | ORAL_TABLET | Freq: Every day | ORAL | Status: DC
  Administered 2014-06-29 – 2014-07-04 (×5): 5 mg via ORAL
  Filled 2014-06-28 (×5): qty 1

## 2014-06-28 MED ORDER — FOLIC ACID 0.5 MG HALF TAB
500.0000 ug | ORAL_TABLET | Freq: Every day | ORAL | Status: DC
  Administered 2014-06-29 – 2014-07-04 (×5): 0.5 mg via ORAL
  Filled 2014-06-28 (×6): qty 1

## 2014-06-28 MED ORDER — SODIUM CHLORIDE 0.9 % IJ SOLN
3.0000 mL | Freq: Two times a day (BID) | INTRAMUSCULAR | Status: DC
  Administered 2014-06-29 – 2014-07-03 (×8): 3 mL via INTRAVENOUS

## 2014-06-28 MED ORDER — GLUCOSAMINE HCL 1500 MG PO TABS: 1500.0000 mg | ORAL_TABLET | Freq: Two times a day (BID) | ORAL | Status: DC

## 2014-06-28 MED ORDER — HORSE CHESTNUT 300 MG PO CAPS: 1.0000 | ORAL_CAPSULE | Freq: Every day | ORAL | Status: DC

## 2014-06-28 MED ORDER — BACITRACIN ZINC 500 UNIT/GM EX OINT
TOPICAL_OINTMENT | Freq: Two times a day (BID) | CUTANEOUS | Status: DC
  Administered 2014-06-29 – 2014-07-01 (×6): via TOPICAL
  Administered 2014-07-01: 1 via TOPICAL
  Administered 2014-07-02: 14:00:00 via TOPICAL
  Administered 2014-07-02: 1 via TOPICAL
  Administered 2014-07-03 – 2014-07-04 (×3): via TOPICAL
  Filled 2014-06-28 (×15): qty 28.35

## 2014-06-28 MED ORDER — FENTANYL CITRATE (PF) 100 MCG/2ML IJ SOLN
25.0000 ug | INTRAMUSCULAR | Status: AC | PRN
  Administered 2014-06-28 (×3): 25 ug via INTRAVENOUS
  Filled 2014-06-28 (×3): qty 2

## 2014-06-28 MED ORDER — ONDANSETRON HCL 4 MG/2ML IJ SOLN
4.0000 mg | Freq: Four times a day (QID) | INTRAMUSCULAR | Status: DC | PRN
  Administered 2014-06-29 – 2014-07-02 (×2): 4 mg via INTRAVENOUS
  Filled 2014-06-28 (×2): qty 2

## 2014-06-28 MED ORDER — HEPARIN SODIUM (PORCINE) 5000 UNIT/ML IJ SOLN
5000.0000 [IU] | Freq: Three times a day (TID) | INTRAMUSCULAR | Status: DC
  Administered 2014-06-28 – 2014-06-29 (×3): 5000 [IU] via SUBCUTANEOUS
  Filled 2014-06-28 (×3): qty 1

## 2014-06-28 MED ORDER — MORPHINE SULFATE 2 MG/ML IJ SOLN
2.0000 mg | INTRAMUSCULAR | Status: DC | PRN
  Administered 2014-06-28: 2 mg via INTRAVENOUS
  Filled 2014-06-28: qty 1

## 2014-06-28 MED ORDER — ONDANSETRON HCL 4 MG PO TABS: 4.0000 mg | ORAL_TABLET | Freq: Four times a day (QID) | ORAL | Status: DC | PRN

## 2014-06-28 MED ORDER — INSULIN ASPART 100 UNIT/ML ~~LOC~~ SOLN
0.0000 [IU] | Freq: Three times a day (TID) | SUBCUTANEOUS | Status: DC
  Administered 2014-06-30 – 2014-07-01 (×2): 1 [IU] via SUBCUTANEOUS
  Administered 2014-07-01 – 2014-07-02 (×3): 2 [IU] via SUBCUTANEOUS
  Administered 2014-07-02 – 2014-07-03 (×3): 1 [IU] via SUBCUTANEOUS
  Administered 2014-07-03 (×2): 2 [IU] via SUBCUTANEOUS
  Administered 2014-07-04 (×2): 1 [IU] via SUBCUTANEOUS

## 2014-06-28 MED ORDER — AMIODARONE HCL 100 MG PO TABS
200.0000 mg | ORAL_TABLET | Freq: Every morning | ORAL | Status: DC
  Administered 2014-06-29 – 2014-07-04 (×6): 200 mg via ORAL
  Filled 2014-06-28 (×6): qty 2

## 2014-06-28 MED ORDER — OMEGA-3-ACID ETHYL ESTERS 1 G PO CAPS
1.0000 g | ORAL_CAPSULE | Freq: Every day | ORAL | Status: DC
Start: 2014-06-29 — End: 2014-07-04
  Administered 2014-06-29 – 2014-07-04 (×5): 1 g via ORAL
  Filled 2014-06-28 (×5): qty 1

## 2014-06-28 NOTE — ED Notes (Signed)
Per EMS, patient was at home and walked inside from a swing outside, patient became dizzy and sat down inside. States felt fine and got up to use restroom and became dizzy again and fell on right side. Per EMS, abrasion to right forearm and right hip pain - reports rotation on initial assessment. Binder in place on arrival. Heart hx, on blood thinner.

## 2014-06-28 NOTE — Progress Notes (Signed)
PHARMACIST - PHYSICIAN ORDER COMMUNICATION  CONCERNING: P&T Medication Policy on Herbal Medications  DESCRIPTION:  This patient's order for:  Glucosamine and horse chestnut caps  has been noted.  This product(s) is classified as an "herbal" or natural product. Due to a lack of definitive safety studies or FDA approval, nonstandard manufacturing practices, plus the potential risk of unknown drug-drug interactions while on inpatient medications, the Pharmacy and Therapeutics Committee does not permit the use of "herbal" or natural products of this type within [facility].   ACTION TAKEN: The pharmacy department is unable to verify this order at this time and your patient has been informed of this safety policy. Please reevaluate patient's clinical condition at discharge and address if the herbal or natural product(s) should be resumed at that time.

## 2014-06-28 NOTE — ED Provider Notes (Signed)
CSN: [id number]     Arrival date & time 06/28/14  1631 History   None    Chief Complaint  Patient presents with  . Near Syncope  . Hip Pain     (Consider location/radiation/quality/duration/timing/severity/associated sxs/prior Treatment) Patient is a 81 y.o. male presenting with fall. The history is provided by the patient. No language interpreter was used.  Fall This is a new problem. The current episode started today. The problem has been unchanged. Associated symptoms include joint swelling. Pertinent negatives include no abdominal pain, arthralgias, change in bowel habit, chest pain, congestion, coughing, diaphoresis, fatigue, fever, headaches, nausea, numbness, visual change, vomiting or weakness. The symptoms are aggravated by standing. He has tried nothing for the symptoms.    Past Medical History  Diagnosis Date  . Lumbar disc disease   . BPH (benign prostatic hyperplasia)   . HTN (hypertension)   . Diabetes mellitus type II   . Sinus node dysfunction     symptomatic bradycardia  . Pacemaker     [employer]-dual-chamber-DOI 2012  . CAD (coronary artery disease)     followed by Dr. Wynonia Lawman  . Arthritis   . History of skin cancer   . Frequency of urination   . Nocturia   . Chronic kidney disease     kidneys function at "20 %" followed by Dr. Mercy Moore  . Cancer     history of skin cancer  . Dysrhythmia     a fib  . Prostate cancer   . Ruptured disk 1992  . Skin cancer    Past Surgical History  Procedure Laterality Date  . Coronary artery bypass graft  2003    x3  . Cardiac catheterization    . Lumbar laminectomy  11/01  . Cardioversion  08/08/2011    Procedure: CARDIOVERSION;  Surgeon: Jacolyn Reedy, MD;  Location: [place];  Service: Cardiovascular;  Laterality: N/A;  . Cardioversion N/A 07/16/2012    Procedure: CARDIOVERSION;  Surgeon: Jacolyn Reedy, MD;  Location: [place];  Service: Cardiovascular;  Laterality: N/A;  . Pacemaker insertion  2012  .  Cataracts removed    . Cystoscopy with biopsy N/A 06/21/2013    Procedure: CYSTOSCOPY WITH BIOPSY;  Surgeon: Molli Hazard, MD;  Location: [facility];  Service: Urology;  Laterality: N/A;    TUR RESECTION OF POLYP BILATERAL RETROGRADE PYELOGRAM BLADDER BIOPSY    . Prostate biopsy N/A 06/21/2013    Procedure: BIOPSY TRANSRECTAL ULTRASONIC PROSTATE (TUBP);  Surgeon: Molli Hazard, MD;  Location: [facility];  Service: Urology;  Laterality: N/A;  PROSTATE NERVE BLOCK  . Appendectomy  1980   Family History  Problem Relation Age of Onset  . Cancer Mother     breast  . Cancer Father     prostate, esophagus   History  Substance Use Topics  . Smoking status: Former Smoker -- 0.75 packs/day    Types: Cigarettes    Quit date: 03/12/1968  . Smokeless tobacco: Never Used  . Alcohol Use: No    Review of Systems  Constitutional: Negative for fever, diaphoresis and fatigue.  HENT: Negative for congestion.   Respiratory: Negative for cough, chest tightness and shortness of breath.   Cardiovascular: Negative for chest pain.  Gastrointestinal: Negative for nausea, vomiting, abdominal pain and change in bowel habit.  Musculoskeletal: Positive for joint swelling and gait problem. Negative for arthralgias.  Neurological: Positive for light-headedness. Negative for speech difficulty, weakness, numbness and headaches.  Psychiatric/Behavioral: Negative for confusion.  All other systems  reviewed and are negative.     Allergies  Crestor; Lipitor; Tramadol; Vicodin; and Penicillins  Home Medications   Prior to Admission medications   Medication Sig Start Date End Date Taking? Authorizing Provider  amiodarone (PACERONE) 200 MG tablet Take 200 mg by mouth every morning.    Historical Provider, MD  atenolol (TENORMIN) 25 MG tablet Take 25 mg by mouth every morning.     Historical Provider, MD  finasteride (PROSCAR) 5 MG tablet Take 5 mg by mouth daily.      Historical Provider, MD  folic  acid (FOLVITE) 951 MCG tablet Take 400 mcg by mouth daily.      Historical Provider, MD  furosemide (LASIX) 40 MG tablet Take 1.5 tablets (60 mg total) by mouth 2 (two) times daily. 07/28/13   Barton Dubois, MD  glimepiride (AMARYL) 1 MG tablet Take 1 mg by mouth daily before breakfast.      Historical Provider, MD  Glucosamine HCl 1500 MG TABS Take 1 tablet by mouth 2 (two) times daily.     Historical Provider, MD  Horse Chestnut 300 MG CAPS Take 1 capsule by mouth daily.     Historical Provider, MD  hyoscyamine (LEVSIN, ANASPAZ) 0.125 MG tablet Take 1 tablet (0.125 mg total) by mouth every 4 (four) hours as needed (bladder spasms). 06/21/13   Rolan Bucco, MD  Multiple Vitamin (MULTIVITAMIN WITH MINERALS) TABS tablet Take 1 tablet by mouth daily.    Historical Provider, MD  Omega-3 Fatty Acids (FISH OIL) 1200 MG CAPS Take 1,200 mg by mouth daily.    Historical Provider, MD  warfarin (COUMADIN) 4 MG tablet Take 4 mg by mouth every evening.    Historical Provider, MD   BP 103/90 mmHg  Pulse 59  Temp(Src) 98.9 F (37.2 C) (Oral)  Resp 20  Ht 6\' 2"  (1.88 m)  Wt 226 lb (102.513 kg)  BMI 29.00 kg/m2  SpO2 95% Physical Exam  Constitutional: He is oriented to person, place, and time. He appears well-developed and well-nourished. No distress.  HENT:  Head: Normocephalic and atraumatic.  Nose: Nose normal.  Mouth/Throat: Oropharynx is clear and moist. No oropharyngeal exudate.  Scalp atraumatic, no facial lacerations/abrasions.  No midface instability, no step offs.  Nontender diffusely.   Eyes: EOM are normal. Pupils are equal, round, and reactive to light.  Neck: Normal range of motion. Neck supple.  Cardiovascular: Normal rate, regular rhythm, normal heart sounds and intact distal pulses.   No murmur heard. Pulmonary/Chest: Effort normal and breath sounds normal. No respiratory distress. He has no wheezes. He exhibits no tenderness.  Abdominal: Soft. He exhibits no distension. There is no  tenderness. There is no guarding.  Musculoskeletal: He exhibits tenderness.  Pelvis stable but tender to palpation of right hip.  RLE held externally rotated but not shortened. Good PT pulses bilaterally.  Able to range right ankle and toes, but + right hip pain with proximal leg movement.   No chest tenderness to palpation, stable, and no crepitus.  Moving all other extremities, with no gross deformities, nontender diffusely.  No C/T/L spine midline tenderness, no step offs.   Right forearm with 2 x superficial skin tears.     Neurological: He is alert and oriented to person, place, and time. No cranial nerve deficit. Coordination normal.  Skin: Skin is warm and dry. He is not diaphoretic. No pallor.  Psychiatric: He has a normal mood and affect. His behavior is normal. Judgment and thought content normal.  Nursing note  and vitals reviewed.   ED Course  Procedures (including critical care time) Labs Review Labs Reviewed  CBC WITH DIFFERENTIAL/PLATELET - Abnormal; Notable for the following:    WBC 14.5 (*)    RBC 3.79 (*)    Hemoglobin 10.8 (*)    HCT 34.2 (*)    RDW 16.9 (*)    Neutrophils Relative % 87 (*)    Neutro Abs 12.6 (*)    Lymphocytes Relative 9 (*)    All other components within normal limits  BASIC METABOLIC PANEL - Abnormal; Notable for the following:    BUN 75 (*)    Creatinine, Ser 3.62 (*)    GFR calc non Af Amer 14 (*)    GFR calc Af Amer 16 (*)    All other components within normal limits  PROTIME-INR - Abnormal; Notable for the following:    Prothrombin Time 19.7 (*)    INR 1.66 (*)    All other components within normal limits  GLUCOSE, CAPILLARY - Abnormal; Notable for the following:    Glucose-Capillary 196 (*)    All other components within normal limits  APTT  CREATININE, URINE, RANDOM  UREA NITROGEN, URINE  BRAIN NATRIURETIC PEPTIDE  HEMOGLOBIN A1C  LIPID PANEL  BASIC METABOLIC PANEL  CBC  CBG MONITORING, ED  TYPE AND SCREEN  ABO/RH     Imaging Review Dg Chest 2 View  06/28/2014   CLINICAL DATA:  Near syncope, hip pain  EXAM: CHEST  2 VIEW  COMPARISON:  07/24/2013  FINDINGS: There is elevation of the right diaphragm. There is bibasilar atelectasis versus scarring. There is no focal consolidation, pleural effusion, or pneumothorax. The heart and mediastinum are stable. There is a dual lead cardiac pacer. There is evidence of prior CABG.  The osseous structures are unremarkable.  IMPRESSION: No active cardiopulmonary disease.   Electronically Signed   By: Kathreen Devoid   On: 06/28/2014 18:01   Ct Head Wo Contrast  06/28/2014   CLINICAL DATA:  Episode of acute dizziness with fall resulting in trauma to the right side of the head. Anticoagulation it.  EXAM: CT HEAD WITHOUT CONTRAST  TECHNIQUE: Contiguous axial images were obtained from the base of the skull through the vertex without intravenous contrast.  COMPARISON:  None.  FINDINGS: There is generalized age related atrophy. There are areas of low density in the deep white matter consistent with mild chronic small vessel disease. No sign of acute infarction, mass lesion, hemorrhage, hydrocephalus or extra-axial collection. No skull fracture. No fluid in the sinuses, middle ears or mastoids.  IMPRESSION: No acute or traumatic finding. Age related atrophy and mild chronic small vessel disease.   Electronically Signed   By: Nelson Chimes M.D.   On: 06/28/2014 18:11   Dg Hip Unilat With Pelvis 2-3 Views Right  06/28/2014   CLINICAL DATA:  Syncope, hip pain  EXAM: RIGHT HIP (WITH PELVIS) 2-3 VIEWS  COMPARISON:  None.  FINDINGS: There is a nondisplaced right femoral neck fracture. There is no other fracture or dislocation.  IMPRESSION: Nondisplaced, right femoral neck fracture.   Electronically Signed   By: Kathreen Devoid   On: 06/28/2014 18:03   Dg Femur, Min 2 Views Right  06/28/2014   CLINICAL DATA:  Syncope, hip pain  EXAM: RIGHT FEMUR 2 VIEWS  COMPARISON:  None.  FINDINGS: There is a  nondisplaced right femoral neck fracture. There is no hip dislocation. There is peripheral vascular atherosclerotic disease. Soft tissues are unremarkable.  IMPRESSION: Nondisplaced,  right femoral neck fracture.   Electronically Signed   By: Kathreen Devoid   On: 06/28/2014 18:03     EKG Interpretation   Date/Time:  Tuesday June 28 2014 16:33:03 EDT Ventricular Rate:  61 PR Interval:  124 QRS Duration: 136 QT Interval:  463 QTC Calculation: 466 R Axis:   64 Text Interpretation:  Atrial fibrillation Right bundle branch block  Minimal ST depression, anterior leads When compared with ECG of 07/24/2013,  HEART RATE has decreased Confirmed by [facility]  MD, DAVID ([id number]) on  06/28/2014 5:05:55 PM      MDM   Final diagnoses:  Fall  Fall   Pt is a 81 yo M with hx of sick sinus syndrome with pacer, HTN, DM, CAD, CKD, and Afib (on coumadin) who presents after a fall.  Stood up, felt lightheaded, then lost his balance and fell towards the wall on the right.  Hit his right arm and head on the wall then landed on his right hip.  Felt immediate pain at his right hip and was not able to ambulate.    Well appearing, in NAD.  Holding right leg externally rotated and tender at right lateral hip.  Superficial skin tears on right arm.  No other signs of trauma on exam.  Distally NVI at his right leg, no concern for vascular compromise. No open wounds around right hip.   CT head, CXR, and images of patient's right leg were sent along with blood work.  He was given fentanyl for pain.    Xray shows a nondisplaced right femoral neck fracture.  Patient and family informed of fracture.  Remainder of imaging returned benign.  INR 1.66.   Given additional pain meds while in the ED.   Spoke to Dr. Percell Miller with ortho at [place].  He will consult on the patient and likely do operative management on the fracture on Thursday.  Requests holding the coumadin until after the operation.   To admit to hospitalist team.  [facility]  for the floor.   Patient was seen with ED Attending, Dr. Maryjean Ka, MD  Tori Milks, MD 18/84/16 [id number]  Delora Fuel, MD 01/60/10 [id number]

## 2014-06-28 NOTE — H&P (Addendum)
[facility] History and Physical  Christopher [name] TOI:[id number] DOB: 1926/03/27 DOA: 06/28/2014  Referring physician: ED physician PCP: Tivis Ringer, MD  Specialists:   Chief Complaint: right hip pain after fall  HPI: Christopher [name] is a 81 y.o. male with past medical history of hypertension, diabetes mellitus, coronary artery disease (post status of CABG in 2008), sick sinus syndrome, pacemaker placement, BPH, chronic kidney disease-stage IV, history of prostate cancer, history of skin cancer, atrial fibrillation on Coumadin, who presents with right hip pain after fall.  Patient reports that at abut 3:00-4:00 PM, he felt lightheaded when she stood up from sitting position. He fell towards the wall on the right.He injured his right hip, developed pain over left hip. There is small superficial skin tears on right arm. Patient denies head injury, loss of consciousness or seizure.  ROS: currently patient denies fever, chills, fatigue, running nose, ear pain, headaches, cough, chest pain, SOB, abdominal pain, diarrhea, constipation, dysuria, urgency, frequency, hematuria, skin rashes or leg swelling. No unilateral weakness, numbness or tingling sensations. No vision change or hearing loss.  In ED, patient was found to have negative CT-head for acute abdomen or masses. X-ray showed right hip non-displaced femoral neck fracture. WBC 14.5, temperature normal, INR 1.66, slightly worsening renal function. Patient is admitted to inpatient for further evaluation and treatment. Orthopedic surgery was consulted by Ed.   Review of Systems: As presented in the history of presenting illness, rest negative.  Where does patient live? At home Can patient participate in ADLs?  barely Allergy:  Allergies  Allergen Reactions  . Crestor [Rosuvastatin] Other (See Comments)    Aches in joint   . Lipitor [Atorvastatin] Other (See Comments)    Joint aching  . Penicillins Rash  . Tramadol Nausea Only  .  Vicodin [Hydrocodone-Acetaminophen] Anxiety    "Made me nervous"    Past Medical History  Diagnosis Date  . Lumbar disc disease   . BPH (benign prostatic hyperplasia)   . HTN (hypertension)   . Diabetes mellitus type II   . Sinus node dysfunction     symptomatic bradycardia  . Pacemaker     Medtronic-dual-chamber-DOI 2012  . CAD (coronary artery disease)     followed by Dr. Wynonia Lawman  . Arthritis   . History of skin cancer   . Frequency of urination   . Nocturia   . Chronic kidney disease     kidneys function at "20 %" followed by Dr. Mercy Moore  . Cancer     history of skin cancer  . Dysrhythmia     a fib  . Prostate cancer   . Ruptured disk 1992  . Skin cancer     Past Surgical History  Procedure Laterality Date  . Coronary artery bypass graft  2003    x3  . Cardiac catheterization    . Lumbar laminectomy  11/01  . Cardioversion  08/08/2011    Procedure: CARDIOVERSION;  Surgeon: Jacolyn Reedy, MD;  Location: [facility];  Service: Cardiovascular;  Laterality: N/A;  . Cardioversion N/A 07/16/2012    Procedure: CARDIOVERSION;  Surgeon: Jacolyn Reedy, MD;  Location: [place];  Service: Cardiovascular;  Laterality: N/A;  . Pacemaker insertion  2012  . Cataracts removed    . Cystoscopy with biopsy N/A 06/21/2013    Procedure: CYSTOSCOPY WITH BIOPSY;  Surgeon: Molli Hazard, MD;  Location: [facility];  Service: Urology;  Laterality: N/A;    TUR RESECTION OF POLYP BILATERAL RETROGRADE PYELOGRAM BLADDER BIOPSY    .  Prostate biopsy N/A 06/21/2013    Procedure: BIOPSY TRANSRECTAL ULTRASONIC PROSTATE (TUBP);  Surgeon: Molli Hazard, MD;  Location: [facility];  Service: Urology;  Laterality: N/A;  PROSTATE NERVE BLOCK  . Appendectomy  1980    Social History:  reports that he quit smoking about 46 years ago. His smoking use included Cigarettes. He smoked 0.75 packs per day. He has never used smokeless tobacco. He reports that he does not drink alcohol or use illicit  drugs.  Family History:  Family History  Problem Relation Age of Onset  . Cancer Mother     breast  . Cancer Father     prostate, esophagus     Prior to Admission medications   Medication Sig Start Date End Date Taking? Authorizing Provider  amiodarone (PACERONE) 200 MG tablet Take 200 mg by mouth every morning.   Yes Historical Provider, MD  atenolol (TENORMIN) 25 MG tablet Take 25 mg by mouth every morning.    Yes Historical Provider, MD  finasteride (PROSCAR) 5 MG tablet Take 5 mg by mouth daily.     Yes Historical Provider, MD  folic acid (FOLVITE) 496 MCG tablet Take 400 mcg by mouth daily.     Yes Historical Provider, MD  furosemide (LASIX) 40 MG tablet Take 1.5 tablets (60 mg total) by mouth 2 (two) times daily. Patient taking differently: Take 40 mg by mouth 2 (two) times daily.  07/28/13  Yes Barton Dubois, MD  glimepiride (AMARYL) 1 MG tablet Take 1 mg by mouth daily before breakfast.     Yes Historical Provider, MD  Glucosamine HCl 1500 MG TABS Take 1,500 mg by mouth 2 (two) times daily.    Yes Historical Provider, MD  Horse Chestnut 300 MG CAPS Take 1 capsule by mouth daily.    Yes Historical Provider, MD  Multiple Vitamin (MULTIVITAMIN WITH MINERALS) TABS tablet Take 1 tablet by mouth daily.   Yes Historical Provider, MD  Omega-3 Fatty Acids (FISH OIL) 1200 MG CAPS Take 1,200 mg by mouth daily.   Yes Historical Provider, MD  warfarin (COUMADIN) 4 MG tablet Take 4 mg by mouth every evening.   Yes Historical Provider, MD  hyoscyamine (LEVSIN, ANASPAZ) 0.125 MG tablet Take 1 tablet (0.125 mg total) by mouth every 4 (four) hours as needed (bladder spasms). 06/21/13   Rolan Bucco, MD    Physical Exam: Filed Vitals:   06/28/14 1930 06/28/14 1948 06/28/14 2021 06/29/14 0520  BP: 119/56 142/66 122/46 99/56  Pulse: 61 60 62 62  Temp:  98 F (36.7 C) 98.6 F (37 C) 98.1 F (36.7 C)  TempSrc:      Resp:  18 18 18   Height:      Weight:      SpO2: 96% 91% 96% 93%    General: Not in acute distress HEENT:       Eyes: PERRL, EOMI, no scleral icterus       ENT: No discharge from the ears and nose, no pharynx injection, no tonsillar enlargement.        Neck: No JVD, no bruit, no mass felt. Cardiac: S1/S2, RRR, No murmurs, No gallops or rubs Pulm: Good air movement bilaterally. Clear to auscultation bilaterally. No rales, wheezing, rhonchi or rubs. Abd: Soft, nondistended, nontender, no rebound pain, no organomegaly, BS present Ext: No edema bilaterally. 2+DP/PT pulse bilaterally Musculoskeletal: Right hip tender to palpation.  RLE held externally rotated but not shortened. Good PT pulses bilaterally.  Skin: No rashes.  Neuro: Alert and  oriented X3, cranial nerves II-XII grossly intact, muscle strength 5/5 in all extremeties, sensation to light touch intact.  Psych: Patient is not psychotic, no suicidal or hemocidal ideation.  Labs on Admission:  Basic Metabolic Panel:  Recent Labs Lab 06/28/14 1655  NA 140  K 4.3  CL 105  CO2 24  GLUCOSE 98  BUN 75*  CREATININE 3.62*  CALCIUM 8.9   Liver Function Tests: No results for input(s): AST, ALT, ALKPHOS, BILITOT, PROT, ALBUMIN in the last 168 hours. No results for input(s): LIPASE, AMYLASE in the last 168 hours. No results for input(s): AMMONIA in the last 168 hours. CBC:  Recent Labs Lab 06/28/14 1655  WBC 14.5*  NEUTROABS 12.6*  HGB 10.8*  HCT 34.2*  MCV 90.2  PLT 382   Cardiac Enzymes: No results for input(s): CKTOTAL, CKMB, CKMBINDEX, TROPONINI in the last 168 hours.  BNP (last 3 results) No results for input(s): BNP in the last 8760 hours.  ProBNP (last 3 results)  Recent Labs  07/24/13 0120  PROBNP 9261.0*    CBG:  Recent Labs Lab 06/28/14 1737 06/28/14 2141  GLUCAP 91 196*    Radiological Exams on Admission: Dg Chest 2 View  06/28/2014   CLINICAL DATA:  Near syncope, hip pain  EXAM: CHEST  2 VIEW  COMPARISON:  07/24/2013  FINDINGS: There is elevation of the  right diaphragm. There is bibasilar atelectasis versus scarring. There is no focal consolidation, pleural effusion, or pneumothorax. The heart and mediastinum are stable. There is a dual lead cardiac pacer. There is evidence of prior CABG.  The osseous structures are unremarkable.  IMPRESSION: No active cardiopulmonary disease.   Electronically Signed   By: Kathreen Devoid   On: 06/28/2014 18:01   Ct Head Wo Contrast  06/28/2014   CLINICAL DATA:  Episode of acute dizziness with fall resulting in trauma to the right side of the head. Anticoagulation it.  EXAM: CT HEAD WITHOUT CONTRAST  TECHNIQUE: Contiguous axial images were obtained from the base of the skull through the vertex without intravenous contrast.  COMPARISON:  None.  FINDINGS: There is generalized age related atrophy. There are areas of low density in the deep white matter consistent with mild chronic small vessel disease. No sign of acute infarction, mass lesion, hemorrhage, hydrocephalus or extra-axial collection. No skull fracture. No fluid in the sinuses, middle ears or mastoids.  IMPRESSION: No acute or traumatic finding. Age related atrophy and mild chronic small vessel disease.   Electronically Signed   By: Nelson Chimes M.D.   On: 06/28/2014 18:11   Dg Hip Unilat With Pelvis 2-3 Views Right  06/28/2014   CLINICAL DATA:  Syncope, hip pain  EXAM: RIGHT HIP (WITH PELVIS) 2-3 VIEWS  COMPARISON:  None.  FINDINGS: There is a nondisplaced right femoral neck fracture. There is no other fracture or dislocation.  IMPRESSION: Nondisplaced, right femoral neck fracture.   Electronically Signed   By: Kathreen Devoid   On: 06/28/2014 18:03   Dg Femur, Min 2 Views Right  06/28/2014   CLINICAL DATA:  Syncope, hip pain  EXAM: RIGHT FEMUR 2 VIEWS  COMPARISON:  None.  FINDINGS: There is a nondisplaced right femoral neck fracture. There is no hip dislocation. There is peripheral vascular atherosclerotic disease. Soft tissues are unremarkable.  IMPRESSION:  Nondisplaced, right femoral neck fracture.   Electronically Signed   By: Kathreen Devoid   On: 06/28/2014 18:03    EKG: Independently reviewed.  Abnormal findings:  Low 40 H,  PAC, right bundle blockage, which is similar to previous EKG on 07/24/13  Assessment/Plan Principal Problem:   Closed right hip fracture Active Problems:   CAD (coronary artery disease)   Cardiac pacemaker in situ   Atrial fibrillation   Lumbar disc disease   Hyperlipidemia   Kidney disease, chronic, stage III (GFR 30-59 ml/min)   Long-term (current) use of anticoagulants   BPH (benign prostatic hyperplasia)   DM type 2 causing renal disease   CHF (congestive heart failure)   Hip fracture  right hip fracture: As evidenced by x-ray. Patient has moderate pain now. No neurovascular compromise. Orthopedic surgeon was consulted. Dr. Percell Miller plans to do procedure on Thursday. - will admit to tele bed - Pain control: Dilaudid prn  - follow up ortho recs - type and cross - INR/PTT  AoCKD-IV: Baseline creatinine is 3.0-3.5. His creatinine is 3.62, BUN 75, which is slightly worsening than the baseline. Likely due to prerenal failure.  -hold Lasix -Check FeNA  Lightheadedness: Likely due to orthostatic today because secondary to Lasix use and positional change. -Hold  Lasix -Check orthostatic vital signs -PT/OT  Atrial Fibrillation: CHA2DS2-VASc Score is 5, needs oral anticoagulation. Patient is on Coumadin at home. INR is 1.66 on admission. Heart rate is well controlled. -will need to hold coumadin for preparation of surgery -continue amiodarone and atenolol  Diastolic congestive heart failure: Echo on 07/24/13 showed EF 55-60%. Patient is on Lasix 60 mg twice a day. CHF is compensated. No any leg edema. -Hold her Lasix  -check BNP  Diabetes mellitus: No A1c on record. Patient is on glipizide at home.  -sliding scale insulin -Check A1c  BPH:  -continue Pro scar  Hyperlipidemia: Patient is fish oil at home. No  LDL on record. -Continue home medications -Follow-up FLP  CAD: s/p of CABG. No chest pain now. -Continue atenolol  Leukocytosis: No signs of infection. Most likely due to stress induced demargenation. -follow-up CBC  DVT ppx: SQ Heparin   Code Status: Full code Family Communication:  Yes, patient's  family     at bed side Disposition Plan: Admit to inpatient   Date of Service 06/29/2014    Ivor Costa [facility] Pager [phone]  If 7PM-7AM, please contact night-coverage [email or web address] Password [facility] 06/29/2014, 6:29 AM

## 2014-06-28 NOTE — Consult Note (Signed)
ORTHOPAEDIC CONSULTATION  REQUESTING PHYSICIAN: Delora Fuel, MD  Chief Complaint: right hip fracture  HPI: Christopher [name] is a 81 y.o. male who got light headed and fell to the ground. C/o Right hip pain  Past Medical History  Diagnosis Date  . Lumbar disc disease   . BPH (benign prostatic hyperplasia)   . HTN (hypertension)   . Diabetes mellitus type II   . Sinus node dysfunction     symptomatic bradycardia  . Pacemaker     Medtronic-dual-chamber-DOI 2012  . CAD (coronary artery disease)     followed by Dr. Wynonia Lawman  . Arthritis   . History of skin cancer   . Frequency of urination   . Nocturia   . Chronic kidney disease     kidneys function at "20 %" followed by Dr. Mercy Moore  . Cancer     history of skin cancer  . Dysrhythmia     a fib  . Prostate cancer   . Ruptured disk 1992  . Skin cancer    Past Surgical History  Procedure Laterality Date  . Coronary artery bypass graft  2003    x3  . Cardiac catheterization    . Lumbar laminectomy  11/01  . Cardioversion  08/08/2011    Procedure: CARDIOVERSION;  Surgeon: Jacolyn Reedy, MD;  Location: [place];  Service: Cardiovascular;  Laterality: N/A;  . Cardioversion N/A 07/16/2012    Procedure: CARDIOVERSION;  Surgeon: Jacolyn Reedy, MD;  Location: [place];  Service: Cardiovascular;  Laterality: N/A;  . Pacemaker insertion  2012  . Cataracts removed    . Cystoscopy with biopsy N/A 06/21/2013    Procedure: CYSTOSCOPY WITH BIOPSY;  Surgeon: Molli Hazard, MD;  Location: [facility];  Service: Urology;  Laterality: N/A;    TUR RESECTION OF POLYP BILATERAL RETROGRADE PYELOGRAM BLADDER BIOPSY    . Prostate biopsy N/A 06/21/2013    Procedure: BIOPSY TRANSRECTAL ULTRASONIC PROSTATE (TUBP);  Surgeon: Molli Hazard, MD;  Location: [facility];  Service: Urology;  Laterality: N/A;  PROSTATE NERVE BLOCK  . Appendectomy  1980   History   Social History  . Marital Status: Married    Spouse Name: N/A  .  Number of Children: 3  . Years of Education: N/A   Occupational History  . retired    Social History Main Topics  . Smoking status: Former Smoker -- 0.75 packs/day    Types: Cigarettes    Quit date: 03/12/1968  . Smokeless tobacco: Never Used  . Alcohol Use: No  . Drug Use: No  . Sexual Activity: Not Currently   Other Topics Concern  . None   Social History Narrative   Family History  Problem Relation Age of Onset  . Cancer Mother     breast  . Cancer Father     prostate, esophagus   Allergies  Allergen Reactions  . Crestor [Rosuvastatin] Other (See Comments)    Aches in joint   . Lipitor [Atorvastatin] Other (See Comments)    Joint aching  . Penicillins Rash  . Tramadol Nausea Only  . Vicodin [Hydrocodone-Acetaminophen] Anxiety    "Made me nervous"   Prior to Admission medications   Medication Sig Start Date End Date Taking? Authorizing Provider  amiodarone (PACERONE) 200 MG tablet Take 200 mg by mouth every morning.   Yes Historical Provider, MD  atenolol (TENORMIN) 25 MG tablet Take 25 mg by mouth every morning.    Yes Historical Provider, MD  finasteride (  PROSCAR) 5 MG tablet Take 5 mg by mouth daily.     Yes Historical Provider, MD  folic acid (FOLVITE) 941 MCG tablet Take 400 mcg by mouth daily.     Yes Historical Provider, MD  furosemide (LASIX) 40 MG tablet Take 1.5 tablets (60 mg total) by mouth 2 (two) times daily. Patient taking differently: Take 40 mg by mouth 2 (two) times daily.  07/28/13  Yes Barton Dubois, MD  glimepiride (AMARYL) 1 MG tablet Take 1 mg by mouth daily before breakfast.     Yes Historical Provider, MD  Glucosamine HCl 1500 MG TABS Take 1,500 mg by mouth 2 (two) times daily.    Yes Historical Provider, MD  Horse Chestnut 300 MG CAPS Take 1 capsule by mouth daily.    Yes Historical Provider, MD  Multiple Vitamin (MULTIVITAMIN WITH MINERALS) TABS tablet Take 1 tablet by mouth daily.   Yes Historical Provider, MD  Omega-3 Fatty Acids (FISH  OIL) 1200 MG CAPS Take 1,200 mg by mouth daily.   Yes Historical Provider, MD  warfarin (COUMADIN) 4 MG tablet Take 4 mg by mouth every evening.   Yes Historical Provider, MD  hyoscyamine (LEVSIN, ANASPAZ) 0.125 MG tablet Take 1 tablet (0.125 mg total) by mouth every 4 (four) hours as needed (bladder spasms). 06/21/13   Rolan Bucco, MD   Dg Chest 2 View  06/28/2014   CLINICAL DATA:  Near syncope, hip pain  EXAM: CHEST  2 VIEW  COMPARISON:  07/24/2013  FINDINGS: There is elevation of the right diaphragm. There is bibasilar atelectasis versus scarring. There is no focal consolidation, pleural effusion, or pneumothorax. The heart and mediastinum are stable. There is a dual lead cardiac pacer. There is evidence of prior CABG.  The osseous structures are unremarkable.  IMPRESSION: No active cardiopulmonary disease.   Electronically Signed   By: Kathreen Devoid   On: 06/28/2014 18:01   Ct Head Wo Contrast  06/28/2014   CLINICAL DATA:  Episode of acute dizziness with fall resulting in trauma to the right side of the head. Anticoagulation it.  EXAM: CT HEAD WITHOUT CONTRAST  TECHNIQUE: Contiguous axial images were obtained from the base of the skull through the vertex without intravenous contrast.  COMPARISON:  None.  FINDINGS: There is generalized age related atrophy. There are areas of low density in the deep white matter consistent with mild chronic small vessel disease. No sign of acute infarction, mass lesion, hemorrhage, hydrocephalus or extra-axial collection. No skull fracture. No fluid in the sinuses, middle ears or mastoids.  IMPRESSION: No acute or traumatic finding. Age related atrophy and mild chronic small vessel disease.   Electronically Signed   By: Nelson Chimes M.D.   On: 06/28/2014 18:11   Dg Hip Unilat With Pelvis 2-3 Views Right  06/28/2014   CLINICAL DATA:  Syncope, hip pain  EXAM: RIGHT HIP (WITH PELVIS) 2-3 VIEWS  COMPARISON:  None.  FINDINGS: There is a nondisplaced right femoral neck  fracture. There is no other fracture or dislocation.  IMPRESSION: Nondisplaced, right femoral neck fracture.   Electronically Signed   By: Kathreen Devoid   On: 06/28/2014 18:03   Dg Femur, Min 2 Views Right  06/28/2014   CLINICAL DATA:  Syncope, hip pain  EXAM: RIGHT FEMUR 2 VIEWS  COMPARISON:  None.  FINDINGS: There is a nondisplaced right femoral neck fracture. There is no hip dislocation. There is peripheral vascular atherosclerotic disease. Soft tissues are unremarkable.  IMPRESSION: Nondisplaced, right femoral neck fracture.  Electronically Signed   By: Kathreen Devoid   On: 06/28/2014 18:03    Positive ROS: All other systems have been reviewed and were otherwise negative with the exception of those mentioned in the HPI and as above.  Labs cbc  Recent Labs  06/28/14 1655  WBC 14.5*  HGB 10.8*  HCT 34.2*  PLT 382    Labs inflam No results for input(s): CRP in the last 72 hours.  Invalid input(s): ESR  Labs coag  Recent Labs  06/28/14 1655  INR 1.66*     Recent Labs  06/28/14 1655  NA 140  K 4.3  CL 105  CO2 24  GLUCOSE 98  BUN 75*  CREATININE 3.62*  CALCIUM 8.9    Physical Exam: Filed Vitals:   06/28/14 1845  BP: 114/62  Pulse: 61  Temp:   Resp:    General: Alert, no acute distress Cardiovascular: No pedal edema Respiratory: No cyanosis, no use of accessory musculature GI: No organomegaly, abdomen is soft and non-tender Skin: No lesions in the area of chief complaint other than those listed below in MSK exam.  Neurologic: Sensation intact distally Psychiatric: Patient is competent for consent with normal mood and affect Lymphatic: No axillary or cervical lymphadenopathy  MUSCULOSKELETAL:  RLE: Distally NVI. Pain with any ROM, compartments soft Other extremities are atraumatic with painless ROM and NVI.  Assessment: Right femoral neck fracture  Plan: OR for fixation on Thursday 4/21. pls hold coumadin until post op, will restart with ASA to  bridge.    Renette Butters, MD Cell [phone]   06/28/2014 7:25 PM

## 2014-06-28 NOTE — ED Notes (Signed)
Admitting MD at bedside.

## 2014-06-28 NOTE — ED Notes (Signed)
Patient 87-89% on RA, placed on 2L O2 and O2 sat 93%. Pt. Denies hx of problems with O2.

## 2014-06-28 NOTE — Consult Note (Signed)
I have reviewed his xrays and the chart. He has a Right non-displaced femoral neck fracture  Tentative plan is: R hip pinning in OR on Thursday 4/21 -Hold Coumadin but no reversal needed as INR should be appropriate on Thursday -Formal consult and family discussion to follow.   Edmonia Lynch D Cell: [phone]

## 2014-06-29 DIAGNOSIS — N183 Chronic kidney disease, stage 3 (moderate): Secondary | ICD-10-CM

## 2014-06-29 DIAGNOSIS — I5032 Chronic diastolic (congestive) heart failure: Secondary | ICD-10-CM

## 2014-06-29 LAB — GLUCOSE, CAPILLARY
GLUCOSE-CAPILLARY: 147 mg/dL — AB (ref 70–99)
GLUCOSE-CAPILLARY: 212 mg/dL — AB (ref 70–99)
GLUCOSE-CAPILLARY: 175 mg/dL — AB (ref 70–99)
Glucose-Capillary: 116 mg/dL — ABNORMAL HIGH (ref 70–99)

## 2014-06-29 LAB — URINALYSIS, ROUTINE W REFLEX MICROSCOPIC
Bilirubin Urine: NEGATIVE
Glucose, UA: NEGATIVE mg/dL
HGB URINE DIPSTICK: NEGATIVE
KETONES UR: NEGATIVE mg/dL
Leukocytes, UA: NEGATIVE
Nitrite: NEGATIVE
PROTEIN: NEGATIVE mg/dL
SPECIFIC GRAVITY, URINE: 1.017 (ref 1.005–1.030)
UROBILINOGEN UA: 0.2 mg/dL (ref 0.0–1.0)
pH: 5 (ref 5.0–8.0)

## 2014-06-29 LAB — LIPID PANEL
Cholesterol: 218 mg/dL — ABNORMAL HIGH (ref 0–200)
HDL: 39 mg/dL — ABNORMAL LOW (ref >39)
LDL Cholesterol: 142 mg/dL — ABNORMAL HIGH (ref 0–99)
Total CHOL/HDL Ratio: 5.6 RATIO
Triglycerides: 186 mg/dL — ABNORMAL HIGH (ref <150)
VLDL: 37 mg/dL (ref 0–40)

## 2014-06-29 LAB — BASIC METABOLIC PANEL
Anion gap: 9 (ref 5–15)
BUN: 70 mg/dL — AB (ref 6–23)
CHLORIDE: 106 mmol/L (ref 96–112)
CO2: 25 mmol/L (ref 19–32)
Calcium: 8.8 mg/dL (ref 8.4–10.5)
Creatinine, Ser: 3.37 mg/dL — ABNORMAL HIGH (ref 0.50–1.35)
GFR calc Af Amer: 18 mL/min — ABNORMAL LOW (ref >90)
GFR calc non Af Amer: 15 mL/min — ABNORMAL LOW (ref >90)
Glucose, Bld: 132 mg/dL — ABNORMAL HIGH (ref 70–99)
POTASSIUM: 4.7 mmol/L (ref 3.5–5.1)
SODIUM: 140 mmol/L (ref 135–145)

## 2014-06-29 LAB — CBC
HCT: 32.9 % — ABNORMAL LOW (ref 39.0–52.0)
HEMOGLOBIN: 10.3 g/dL — AB (ref 13.0–17.0)
MCH: 28.7 pg (ref 26.0–34.0)
MCHC: 31.3 g/dL (ref 30.0–36.0)
MCV: 91.6 fL (ref 78.0–100.0)
Platelets: 334 10*3/uL (ref 150–400)
RBC: 3.59 MIL/uL — ABNORMAL LOW (ref 4.22–5.81)
RDW: 17 % — ABNORMAL HIGH (ref 11.5–15.5)
WBC: 14 10*3/uL — ABNORMAL HIGH (ref 4.0–10.5)

## 2014-06-29 LAB — BRAIN NATRIURETIC PEPTIDE: B Natriuretic Peptide: 628.4 pg/mL — ABNORMAL HIGH (ref 0.0–100.0)

## 2014-06-29 MED ORDER — DEXTROSE-NACL 5-0.45 % IV SOLN
100.0000 mL/h | INTRAVENOUS | Status: DC
  Administered 2014-06-29 – 2014-06-30 (×2): 100 mL/h via INTRAVENOUS

## 2014-06-29 MED ORDER — CEFAZOLIN SODIUM-DEXTROSE 2-3 GM-% IV SOLR
2.0000 g | INTRAVENOUS | Status: AC
  Administered 2014-06-30: 2 g via INTRAVENOUS
  Filled 2014-06-29: qty 50

## 2014-06-29 MED ORDER — HYDROMORPHONE HCL 1 MG/ML IJ SOLN
1.0000 mg | INTRAMUSCULAR | Status: AC | PRN
  Administered 2014-06-29 (×2): 1 mg via INTRAVENOUS
  Filled 2014-06-29 (×2): qty 1

## 2014-06-29 MED ORDER — ACETAMINOPHEN 500 MG PO TABS
1000.0000 mg | ORAL_TABLET | Freq: Once | ORAL | Status: AC
  Administered 2014-06-29: 1000 mg via ORAL
  Filled 2014-06-29: qty 2

## 2014-06-29 MED ORDER — DOCUSATE SODIUM 100 MG PO CAPS
100.0000 mg | ORAL_CAPSULE | Freq: Two times a day (BID) | ORAL | Status: DC
  Administered 2014-06-29 – 2014-07-04 (×9): 100 mg via ORAL
  Filled 2014-06-29 (×9): qty 1

## 2014-06-29 NOTE — Progress Notes (Signed)
Rehab Admissions Coordinator Note:  Patient was screened by Cleatrice Burke for appropriateness for an Inpatient Acute Rehab Consult per PT recommendation.  At this time, we are recommending await surgery and post op therapy and plans before determining rehab venue options.Cleatrice Burke 06/29/2014, 1:11 PM  I can be reached at [phone].

## 2014-06-29 NOTE — Progress Notes (Signed)
[facility] PROGRESS NOTE  Christopher [name] KXF:[id number] DOB: 01-30-1927 DOA: 06/28/2014 PCP: Tivis Ringer, MD  Assessment/Plan: #1 right hip fracture Secondary to mechanical fall. Patient states pain is controlled. Patient has been seen in consultation by orthopedics who are planning on surgery to be done tomorrow. Continue current pain regimen. Continue to hold Coumadin. Per orthopedics.  #2 lightheadedness Likely secondary to orthostasis secondary to diuretics. Lasix on hold. Clinical improvement. Continue hydration.  #3 acute on chronic kidney disease stage IV Baseline creatinine 323.5. Patient's creatinine on admission was 3.62 with a BUN of 75. Likely secondary to prerenal azotemia. Diuretics on hold. Renal function improved and a trending down creatinine currently at 3.37. Continue gentle hydration. Follow.  #4 leukocytosis Likely reactive leukocytosis. Patient is currently afebrile. Chest x-ray negative for any acute infiltrate. Follow.  #5 atrial fibrillation Chads2Vasc score is 5. Patient's posttibial Coumadin at home. INR on admission was 1.66. Continue amiodarone and atenolol for rate control. Coumadin on hold in preparation for surgery. Orthopedics to advisement anticoagulation may be resumed.  #6 BPH Stable. Continue Proscar.  #7 diastolic heart failure 2-D echo from 07/24/2013 with a EF of 55-60%. Patient currently on oral Lasix. Patient seems compensated. No signs of volume overload. Continue to hold diuretics. Follow.  #8 diabetes mellitus Check a hemoglobin A1c. Place on sliding scale insulin.  #9 hyperlipidemia Fasting lipid with cholesterol 216 triglycerides of 186 LDL of 142 and HDL of 39. Patient with intolerances to statins and as such is not on a statin. Continue Lovaza.  #10 prophylaxis Heparin for DVT prophylaxis.  Code Status: Full Family Communication: Updated patient and wife at bedside. Disposition Plan: Pending surgery. Likely needs  skilled nursing facility.   Consultants:  Orthopedics: Dr. Edmonia Lynch 06/28/2014  Procedures:  CT head 06/28/2014  Chest x-ray 06/28/2014    Antibiotics:  None  HPI/Subjective: Patient denies CP, no SOB. Pain controlled.  Objective: Filed Vitals:   06/29/14 1300  BP: 122/57  Pulse: 60  Temp: 98.7 F (37.1 C)  Resp: 20    Intake/Output Summary (Last 24 hours) at 06/29/14 1456 Last data filed at 06/29/14 0650  Gross per 24 hour  Intake    240 ml  Output      0 ml  Net    240 ml   Filed Weights   06/28/14 1638 06/29/14 0500  Weight: 102.513 kg (226 lb) 102.604 kg (226 lb 3.2 oz)    Exam:   General:  NAD  Cardiovascular: RRR  Respiratory: CTAB anterior lung fields  Abdomen: Soft/NT/ND/+BS  Musculoskeletal: No c/c/e. RLE externally rotated and shortened.  Data Reviewed: Basic Metabolic Panel:  Recent Labs Lab 06/28/14 1655 06/29/14 0550  NA 140 140  K 4.3 4.7  CL 105 106  CO2 24 25  GLUCOSE 98 132*  BUN 75* 70*  CREATININE 3.62* 3.37*  CALCIUM 8.9 8.8   Liver Function Tests: No results for input(s): AST, ALT, ALKPHOS, BILITOT, PROT, ALBUMIN in the last 168 hours. No results for input(s): LIPASE, AMYLASE in the last 168 hours. No results for input(s): AMMONIA in the last 168 hours. CBC:  Recent Labs Lab 06/28/14 1655 06/29/14 0550  WBC 14.5* 14.0*  NEUTROABS 12.6*  --   HGB 10.8* 10.3*  HCT 34.2* 32.9*  MCV 90.2 91.6  PLT 382 334   Cardiac Enzymes: No results for input(s): CKTOTAL, CKMB, CKMBINDEX, TROPONINI in the last 168 hours. BNP (last 3 results)  Recent Labs  06/29/14 0550  BNP 628.4*  ProBNP (last 3 results)  Recent Labs  07/24/13 0120  PROBNP 9261.0*    CBG:  Recent Labs Lab 06/28/14 1737 06/28/14 2141 06/29/14 0635 06/29/14 1221  GLUCAP 91 196* 116* 147*    No results found for this or any previous visit (from the past 240 hour(s)).   Studies: Dg Chest 2 View  06/28/2014   CLINICAL  DATA:  Near syncope, hip pain  EXAM: CHEST  2 VIEW  COMPARISON:  07/24/2013  FINDINGS: There is elevation of the right diaphragm. There is bibasilar atelectasis versus scarring. There is no focal consolidation, pleural effusion, or pneumothorax. The heart and mediastinum are stable. There is a dual lead cardiac pacer. There is evidence of prior CABG.  The osseous structures are unremarkable.  IMPRESSION: No active cardiopulmonary disease.   Electronically Signed   By: Kathreen Devoid   On: 06/28/2014 18:01   Ct Head Wo Contrast  06/28/2014   CLINICAL DATA:  Episode of acute dizziness with fall resulting in trauma to the right side of the head. Anticoagulation it.  EXAM: CT HEAD WITHOUT CONTRAST  TECHNIQUE: Contiguous axial images were obtained from the base of the skull through the vertex without intravenous contrast.  COMPARISON:  None.  FINDINGS: There is generalized age related atrophy. There are areas of low density in the deep white matter consistent with mild chronic small vessel disease. No sign of acute infarction, mass lesion, hemorrhage, hydrocephalus or extra-axial collection. No skull fracture. No fluid in the sinuses, middle ears or mastoids.  IMPRESSION: No acute or traumatic finding. Age related atrophy and mild chronic small vessel disease.   Electronically Signed   By: Nelson Chimes M.D.   On: 06/28/2014 18:11   Dg Hip Unilat With Pelvis 2-3 Views Right  06/28/2014   CLINICAL DATA:  Syncope, hip pain  EXAM: RIGHT HIP (WITH PELVIS) 2-3 VIEWS  COMPARISON:  None.  FINDINGS: There is a nondisplaced right femoral neck fracture. There is no other fracture or dislocation.  IMPRESSION: Nondisplaced, right femoral neck fracture.   Electronically Signed   By: Kathreen Devoid   On: 06/28/2014 18:03   Dg Femur, Min 2 Views Right  06/28/2014   CLINICAL DATA:  Syncope, hip pain  EXAM: RIGHT FEMUR 2 VIEWS  COMPARISON:  None.  FINDINGS: There is a nondisplaced right femoral neck fracture. There is no hip  dislocation. There is peripheral vascular atherosclerotic disease. Soft tissues are unremarkable.  IMPRESSION: Nondisplaced, right femoral neck fracture.   Electronically Signed   By: Kathreen Devoid   On: 06/28/2014 18:03    Scheduled Meds: . acetaminophen  1,000 mg Oral Once  . amiodarone  200 mg Oral q morning - 10a  . atenolol  25 mg Oral q morning - 10a  . bacitracin   Topical BID  . [START ON 06/30/2014]  ceFAZolin (ANCEF) IV  2 g Intravenous On Call to OR  . finasteride  5 mg Oral Daily  . folic acid  563 mcg Oral Daily  . heparin  5,000 Units Subcutaneous 3 times per day  . insulin aspart  0-9 Units Subcutaneous TID WC  . multivitamin with minerals  1 tablet Oral Daily  . omega-3 acid ethyl esters  1 g Oral Daily  . sodium chloride  3 mL Intravenous Q12H   Continuous Infusions: . dextrose 5 % and 0.45% NaCl      Principal Problem:   Closed right hip fracture Active Problems:   CAD (coronary artery disease)  Cardiac pacemaker in situ   Atrial fibrillation   Lumbar disc disease   Hyperlipidemia   Kidney disease, chronic, stage III (GFR 30-59 ml/min)   Long-term (current) use of anticoagulants   BPH (benign prostatic hyperplasia)   DM type 2 causing renal disease   CHF (congestive heart failure)   Hip fracture    Time spent: 42 minutes    Aleyah Balik M.D. [facility] Pager [phone]. If 7PM-7AM, please contact night-coverage at [email or web address], password [facility] 06/29/2014, 2:56 PM  LOS: 1 day

## 2014-06-29 NOTE — Progress Notes (Signed)
Patient not able to tolerate sitting and standing position for orthostatic v/s. Limited by pain and fx rt hip.

## 2014-06-29 NOTE — Progress Notes (Signed)
Utilization review completed.  

## 2014-06-29 NOTE — Progress Notes (Signed)
OT Cancellation Note  Patient Details Name: LEMAR BAKOS MRN: [id number] DOB: September 01, 1926   Cancelled Treatment:    Reason Eval/Treat Not Completed: Medical issues which prohibited therapy  Noted pt in significant pain and surgery planned for next day. Will defer OT eval until post hip pinning.  Kari Baars, [place] Mccallum D 06/29/2014, 9:13 AM

## 2014-06-29 NOTE — Evaluation (Addendum)
Physical Therapy Evaluation Patient Details Name: Christopher [name] MRN: [id number] DOB: 03-24-1926 Today's Date: 06/29/2014   History of Present Illness  Patient is an 81 y/o male admitted s/p fall with right hip femoral neck fracture.  Awaiting surgery due to on anticoagulation.  PMH positive for HTN, DM, CABG 2008, SSS s/p PPM, BPH, CKD, h/o prostate CA, A-fib.  Clinical Impression  Patient presents with decreased independence with mobility due to right hip fracture and the deficits noted below.  Feel given level of participation today prior to surgery he will benefit from skilled PT in the acute setting and from follow up CIR level rehab prior to d/c home.    Follow Up Recommendations CIR;Supervision/Assistance - 24 hour    Equipment Recommendations  Rolling walker with 5" wheels    Recommendations for Other Services       Precautions / Restrictions Precautions Precautions: Fall Precaution Comments: ? weight bearing on right LE, order for activity as tolerated (stood beside bed pt with WBAT approx 20% max)      Mobility  Bed Mobility Overal bed mobility: Needs Assistance Bed Mobility: Supine to Sit;Sit to Supine     Supine to sit: +2 for physical assistance;Mod assist Sit to supine: +2 for physical assistance;Mod assist   General bed mobility comments: assist and cues for technique with scooting and bringing right leg off bed and assist to lift trunk upright; to supine, cues and assist for right LE and to ensure trunk lowered safely to bed  Transfers Overall transfer level: Needs assistance Equipment used: Rolling walker (2 wheeled) Transfers: Sit to/from Stand Sit to Stand: Mod assist;+2 physical assistance         General transfer comment: cues for hand placement and technique for decreased pain  Ambulation/Gait Ambulation/Gait assistance: Mod assist Ambulation Distance (Feet): 1 Feet Assistive device: Rolling walker (2 wheeled)       General Gait Details: side  steps up to head of bed x 3; cues for weight on hands with walker  Stairs            Wheelchair Mobility    Modified Rankin (Stroke Patients Only)       Balance Overall balance assessment: History of Falls;Needs assistance   Sitting balance-Leahy Scale: Good Sitting balance - Comments: sat edge of bed to get BP x about 4 minutes   Standing balance support: Bilateral upper extremity supported Standing balance-Leahy Scale: Poor Standing balance comment: stood at bedside to get BP at least 3 minutes                             Pertinent Vitals/Pain Pain Assessment: 0-10 Pain Score: 1  Pain Location: right hip Pain Intervention(s): Monitored during session  Note orthostatics taken during treatment: Supine BP 123/69 HR 60 Sitting 112/58 HR 84 Standing 90/49 (pt denies symptoms) Standing 3 minutes 112/47    Home Living Family/patient expects to be discharged to:: Private residence Living Arrangements: Spouse/significant other Available Help at Discharge: Family;Available 24 hours/day Type of Home: House Home Access: Stairs to enter Entrance Stairs-Rails: None Entrance Stairs-Number of Steps: 2 Home Layout: One level Home Equipment: Cane - single point;Shower seat Additional Comments: tub shower    Prior Function Level of Independence: Independent with assistive device(s)               Hand Dominance   Dominant Hand: Right    Extremity/Trunk Assessment  Lower Extremity Assessment: RLE deficits/detail RLE Deficits / Details: AAROM limited by pain, performed ankle pumps without difficulty       Communication   Communication: No difficulties  Cognition Arousal/Alertness: Awake/alert Behavior During Therapy: WFL for tasks assessed/performed Overall Cognitive Status: Within Functional Limits for tasks assessed                      General Comments      Exercises General Exercises - Lower Extremity Ankle  Circles/Pumps: AROM;Both;10 reps;Supine      Assessment/Plan    PT Assessment Patient needs continued PT services  PT Diagnosis Difficulty walking;Acute pain   PT Problem List Decreased strength;Decreased mobility;Decreased balance;Pain  PT Treatment Interventions DME instruction;Therapeutic exercise;Balance training;Gait training;Functional mobility training;Therapeutic activities;Patient/family education;Modalities   PT Goals (Current goals can be found in the Care Plan section) Acute Rehab PT Goals Patient Stated Goal: To return to independent PT Goal Formulation: With patient Time For Goal Achievement: 07/13/14 Potential to Achieve Goals: Good    Frequency Min 3X/week   Barriers to discharge        Co-evaluation               End of Session Equipment Utilized During Treatment: Gait belt Activity Tolerance: Patient tolerated treatment well Patient left: in bed;with call bell/phone within reach;with bed alarm set           Time: 1138-1209 PT Time Calculation (min) (ACUTE ONLY): 31 min   Charges:   PT Evaluation $Initial PT Evaluation Tier I: 1 Procedure PT Treatments $Therapeutic Activity: 8-22 mins   PT G Codes:        Kiyra Slaubaugh,CYNDI 07/01/2014, 12:26 PM  Magda Kiel, [place] 07/01/2014

## 2014-06-30 ENCOUNTER — Inpatient Hospital Stay (HOSPITAL_COMMUNITY): Payer: Medicare Other | Admitting: Anesthesiology

## 2014-06-30 ENCOUNTER — Inpatient Hospital Stay (HOSPITAL_COMMUNITY): Payer: Medicare Other

## 2014-06-30 ENCOUNTER — Encounter (HOSPITAL_COMMUNITY): Payer: Self-pay | Admitting: Anesthesiology

## 2014-06-30 ENCOUNTER — Encounter (HOSPITAL_COMMUNITY)
Admission: EM | Disposition: A | Discharge status: Rehabilitation Facility | Payer: Self-pay | Source: Home / Self Care | Attending: Internal Medicine

## 2014-06-30 HISTORY — PX: HIP PINNING,CANNULATED: SHX1758

## 2014-06-30 LAB — BASIC METABOLIC PANEL
ANION GAP: 10 (ref 5–15)
BUN: 64 mg/dL — ABNORMAL HIGH (ref 6–23)
CALCIUM: 8.6 mg/dL (ref 8.4–10.5)
CHLORIDE: 106 mmol/L (ref 96–112)
CO2: 22 mmol/L (ref 19–32)
Creatinine, Ser: 2.98 mg/dL — ABNORMAL HIGH (ref 0.50–1.35)
GFR calc Af Amer: 21 mL/min — ABNORMAL LOW (ref >90)
GFR, EST NON AFRICAN AMERICAN: 18 mL/min — AB (ref >90)
Glucose, Bld: 124 mg/dL — ABNORMAL HIGH (ref 70–99)
Potassium: 4.9 mmol/L (ref 3.5–5.1)
SODIUM: 138 mmol/L (ref 135–145)

## 2014-06-30 LAB — HEMOGLOBIN A1C
HEMOGLOBIN A1C: 6.1 % — AB (ref 4.8–5.6)
Mean Plasma Glucose: 128 mg/dL

## 2014-06-30 LAB — GLUCOSE, CAPILLARY
GLUCOSE-CAPILLARY: 125 mg/dL — AB (ref 70–99)
GLUCOSE-CAPILLARY: 107 mg/dL — AB (ref 70–99)
GLUCOSE-CAPILLARY: 143 mg/dL — AB (ref 70–99)
Glucose-Capillary: 134 mg/dL — ABNORMAL HIGH (ref 70–99)
Glucose-Capillary: 149 mg/dL — ABNORMAL HIGH (ref 70–99)

## 2014-06-30 LAB — PROTIME-INR
INR: 1.54 — ABNORMAL HIGH (ref 0.00–1.49)
PROTHROMBIN TIME: 18.7 s — AB (ref 11.6–15.2)

## 2014-06-30 LAB — CBC
HEMATOCRIT: 31.5 % — AB (ref 39.0–52.0)
Hemoglobin: 9.7 g/dL — ABNORMAL LOW (ref 13.0–17.0)
MCH: 28.3 pg (ref 26.0–34.0)
MCHC: 30.8 g/dL (ref 30.0–36.0)
MCV: 91.8 fL (ref 78.0–100.0)
Platelets: 290 10*3/uL (ref 150–400)
RBC: 3.43 MIL/uL — ABNORMAL LOW (ref 4.22–5.81)
RDW: 17 % — AB (ref 11.5–15.5)
WBC: 11 10*3/uL — AB (ref 4.0–10.5)

## 2014-06-30 LAB — SURGICAL PCR SCREEN
MRSA, PCR: NEGATIVE
Staphylococcus aureus: NEGATIVE

## 2014-06-30 LAB — UREA NITROGEN, URINE: Urea Nitrogen, Ur: 672 mg/dL

## 2014-06-30 SURGERY — FIXATION, FEMUR, NECK, PERCUTANEOUS, USING SCREW
Anesthesia: General | Laterality: Right

## 2014-06-30 MED ORDER — MEPERIDINE HCL 25 MG/ML IJ SOLN: 6.2500 mg | INTRAMUSCULAR | Status: DC | PRN

## 2014-06-30 MED ORDER — NEOSTIGMINE METHYLSULFATE 10 MG/10ML IV SOLN
INTRAVENOUS | Status: AC
  Filled 2014-06-30: qty 1

## 2014-06-30 MED ORDER — PHENOL 1.4 % MT LIQD: 1.0000 | OROMUCOSAL | Status: DC | PRN

## 2014-06-30 MED ORDER — PHENYLEPHRINE HCL 10 MG/ML IJ SOLN
INTRAMUSCULAR | Status: DC | PRN
  Administered 2014-06-30: 240 ug via INTRAVENOUS
  Administered 2014-06-30: 120 ug via INTRAVENOUS

## 2014-06-30 MED ORDER — ROCURONIUM BROMIDE 50 MG/5ML IV SOLN
INTRAVENOUS | Status: AC
  Filled 2014-06-30: qty 1

## 2014-06-30 MED ORDER — ONDANSETRON HCL 4 MG/2ML IJ SOLN
INTRAMUSCULAR | Status: AC
  Filled 2014-06-30: qty 2

## 2014-06-30 MED ORDER — NEOSTIGMINE METHYLSULFATE 10 MG/10ML IV SOLN
INTRAVENOUS | Status: DC | PRN
  Administered 2014-06-30: 3 mg via INTRAVENOUS

## 2014-06-30 MED ORDER — OXYCODONE HCL 5 MG PO TABS
5.0000 mg | ORAL_TABLET | Freq: Four times a day (QID) | ORAL | Status: DC | PRN
  Administered 2014-06-30 – 2014-07-03 (×5): 5 mg via ORAL
  Filled 2014-06-30 (×5): qty 1

## 2014-06-30 MED ORDER — MORPHINE SULFATE 2 MG/ML IJ SOLN: 0.5000 mg | INTRAMUSCULAR | Status: DC | PRN

## 2014-06-30 MED ORDER — BUPIVACAINE HCL (PF) 0.25 % IJ SOLN
INTRAMUSCULAR | Status: AC
  Filled 2014-06-30: qty 30

## 2014-06-30 MED ORDER — PROPOFOL 10 MG/ML IV BOLUS
INTRAVENOUS | Status: DC | PRN
  Administered 2014-06-30: 100 mg via INTRAVENOUS

## 2014-06-30 MED ORDER — LIDOCAINE HCL (CARDIAC) 20 MG/ML IV SOLN
INTRAVENOUS | Status: DC | PRN
  Administered 2014-06-30: 100 mg via INTRAVENOUS

## 2014-06-30 MED ORDER — FENTANYL CITRATE (PF) 250 MCG/5ML IJ SOLN
INTRAMUSCULAR | Status: AC
Start: 2014-06-30 — End: 2014-06-30
  Filled 2014-06-30: qty 5

## 2014-06-30 MED ORDER — ACETAMINOPHEN 325 MG PO TABS: 650.0000 mg | ORAL_TABLET | Freq: Four times a day (QID) | ORAL | Status: DC | PRN

## 2014-06-30 MED ORDER — FENTANYL CITRATE (PF) 100 MCG/2ML IJ SOLN
INTRAMUSCULAR | Status: DC | PRN
  Administered 2014-06-30 (×2): 50 ug via INTRAVENOUS

## 2014-06-30 MED ORDER — EPHEDRINE SULFATE 50 MG/ML IJ SOLN
INTRAMUSCULAR | Status: AC
  Filled 2014-06-30: qty 1

## 2014-06-30 MED ORDER — WARFARIN - PHYSICIAN DOSING INPATIENT: Freq: Every day | Status: DC

## 2014-06-30 MED ORDER — ARTIFICIAL TEARS OP OINT
TOPICAL_OINTMENT | OPHTHALMIC | Status: DC | PRN
  Administered 2014-06-30: 1 via OPHTHALMIC

## 2014-06-30 MED ORDER — MENTHOL 3 MG MT LOZG: 1.0000 | LOZENGE | OROMUCOSAL | Status: DC | PRN

## 2014-06-30 MED ORDER — EPHEDRINE SULFATE 50 MG/ML IJ SOLN
INTRAMUSCULAR | Status: DC | PRN
  Administered 2014-06-30: 10 mg via INTRAVENOUS

## 2014-06-30 MED ORDER — PROPOFOL 10 MG/ML IV BOLUS
INTRAVENOUS | Status: AC
  Filled 2014-06-30: qty 20

## 2014-06-30 MED ORDER — GLYCOPYRROLATE 0.2 MG/ML IJ SOLN
INTRAMUSCULAR | Status: AC
  Filled 2014-06-30: qty 3

## 2014-06-30 MED ORDER — ACETAMINOPHEN 650 MG RE SUPP: 650.0000 mg | Freq: Four times a day (QID) | RECTAL | Status: DC | PRN

## 2014-06-30 MED ORDER — GLYCOPYRROLATE 0.2 MG/ML IJ SOLN
INTRAMUSCULAR | Status: DC | PRN
  Administered 2014-06-30: 0.4 mg via INTRAVENOUS

## 2014-06-30 MED ORDER — HYDROMORPHONE HCL 1 MG/ML IJ SOLN
0.2500 mg | INTRAMUSCULAR | Status: DC | PRN
  Administered 2014-06-30 (×3): 0.25 mg via INTRAVENOUS

## 2014-06-30 MED ORDER — PHENYLEPHRINE HCL 10 MG/ML IJ SOLN
10.0000 mg | INTRAMUSCULAR | Status: DC | PRN
  Administered 2014-06-30: 20 ug/min via INTRAVENOUS

## 2014-06-30 MED ORDER — LIDOCAINE HCL (CARDIAC) 20 MG/ML IV SOLN
INTRAVENOUS | Status: AC
  Filled 2014-06-30: qty 5

## 2014-06-30 MED ORDER — CEFAZOLIN SODIUM-DEXTROSE 2-3 GM-% IV SOLR
2.0000 g | Freq: Four times a day (QID) | INTRAVENOUS | Status: AC
  Administered 2014-06-30 (×2): 2 g via INTRAVENOUS
  Filled 2014-06-30 (×2): qty 50

## 2014-06-30 MED ORDER — SODIUM CHLORIDE 0.9 % IJ SOLN
INTRAMUSCULAR | Status: AC
  Filled 2014-06-30: qty 10

## 2014-06-30 MED ORDER — ENOXAPARIN SODIUM 30 MG/0.3ML ~~LOC~~ SOLN
30.0000 mg | SUBCUTANEOUS | Status: DC
  Administered 2014-07-01 – 2014-07-04 (×4): 30 mg via SUBCUTANEOUS
  Filled 2014-06-30 (×4): qty 0.3

## 2014-06-30 MED ORDER — HYDROMORPHONE HCL 1 MG/ML IJ SOLN
INTRAMUSCULAR | Status: AC
  Filled 2014-06-30: qty 1

## 2014-06-30 MED ORDER — ONDANSETRON HCL 4 MG/2ML IJ SOLN: 4.0000 mg | Freq: Once | INTRAMUSCULAR | Status: DC | PRN

## 2014-06-30 MED ORDER — WARFARIN SODIUM 4 MG PO TABS
4.0000 mg | ORAL_TABLET | Freq: Every evening | ORAL | Status: DC
  Administered 2014-06-30: 4 mg via ORAL
  Filled 2014-06-30 (×3): qty 1

## 2014-06-30 MED ORDER — SODIUM CHLORIDE 0.9 % IV SOLN
INTRAVENOUS | Status: DC | PRN
  Administered 2014-06-30: 10:00:00 via INTRAVENOUS

## 2014-06-30 MED ORDER — ROCURONIUM BROMIDE 100 MG/10ML IV SOLN
INTRAVENOUS | Status: DC | PRN
  Administered 2014-06-30: 30 mg via INTRAVENOUS

## 2014-06-30 MED ORDER — HYDROCODONE-ACETAMINOPHEN 5-325 MG PO TABS: 1.0000 | ORAL_TABLET | Freq: Four times a day (QID) | ORAL | Status: DC | PRN

## 2014-06-30 MED ORDER — DOCUSATE SODIUM 100 MG PO CAPS: 100.0000 mg | ORAL_CAPSULE | Freq: Two times a day (BID) | ORAL | Status: DC

## 2014-06-30 MED ORDER — HYDROMORPHONE HCL 1 MG/ML IJ SOLN
1.0000 mg | INTRAMUSCULAR | Status: DC | PRN
  Administered 2014-06-30 – 2014-07-04 (×9): 1 mg via INTRAVENOUS
  Filled 2014-06-30 (×9): qty 1

## 2014-06-30 MED ORDER — HYDROCODONE-ACETAMINOPHEN 5-325 MG PO TABS
1.0000 | ORAL_TABLET | Freq: Four times a day (QID) | ORAL | Status: DC | PRN
  Administered 2014-07-01 – 2014-07-04 (×8): 2 via ORAL
  Filled 2014-06-30 (×9): qty 2

## 2014-06-30 MED ORDER — ONDANSETRON HCL 4 MG/2ML IJ SOLN
INTRAMUSCULAR | Status: DC | PRN
  Administered 2014-06-30: 4 mg via INTRAVENOUS

## 2014-06-30 SURGICAL SUPPLY — 38 items
BIT DRILL 4.9 CANNULATED (BIT) ×1
BIT DRILL CANN QC 4.9 LRG (BIT) ×1 IMPLANT
BNDG COHESIVE 4X5 TAN STRL (GAUZE/BANDAGES/DRESSINGS) ×3 IMPLANT
BNDG GAUZE ELAST 4 BULKY (GAUZE/BANDAGES/DRESSINGS) ×3 IMPLANT
COVER PERINEAL POST (MISCELLANEOUS) ×3 IMPLANT
COVER SURGICAL LIGHT HANDLE (MISCELLANEOUS) ×3 IMPLANT
DRAPE STERI IOBAN 125X83 (DRAPES) ×3 IMPLANT
DRILL BIT CANNULATED 4.9 (BIT) ×2
DRSG MEPILEX BORDER 4X4 (GAUZE/BANDAGES/DRESSINGS) ×3 IMPLANT
DURAPREP 26ML APPLICATOR (WOUND CARE) ×3 IMPLANT
ELECT REM PT RETURN 9FT ADLT (ELECTROSURGICAL) ×3
ELECTRODE REM PT RTRN 9FT ADLT (ELECTROSURGICAL) ×1 IMPLANT
GLOVE BIO SURGEON STRL SZ7 (GLOVE) ×3 IMPLANT
GLOVE BIO SURGEON STRL SZ7.5 (GLOVE) ×6 IMPLANT
GLOVE BIOGEL PI IND STRL 7.0 (GLOVE) ×1 IMPLANT
GLOVE BIOGEL PI IND STRL 8 (GLOVE) ×1 IMPLANT
GLOVE BIOGEL PI INDICATOR 7.0 (GLOVE) ×2
GLOVE BIOGEL PI INDICATOR 8 (GLOVE) ×2
GOWN STRL REUS W/ TWL LRG LVL3 (GOWN DISPOSABLE) ×1 IMPLANT
GOWN STRL REUS W/TWL LRG LVL3 (GOWN DISPOSABLE) ×2
GUIDEWIRE THRD ASNIS 3.2X300 (WIRE) ×3 IMPLANT
KIT BASIN OR (CUSTOM PROCEDURE TRAY) ×3 IMPLANT
KIT ROOM TURNOVER OR (KITS) ×3 IMPLANT
MANIFOLD NEPTUNE II (INSTRUMENTS) ×3 IMPLANT
NS IRRIG 1000ML POUR BTL (IV SOLUTION) ×3 IMPLANT
PACK GENERAL/GYN (CUSTOM PROCEDURE TRAY) ×3 IMPLANT
PAD ARMBOARD 7.5X6 YLW CONV (MISCELLANEOUS) ×6 IMPLANT
SCREW ASNIS 90MM (Screw) ×3 IMPLANT
SCREW ASNIS 95MM (Screw) ×6 IMPLANT
SUT MNCRL AB 4-0 PS2 18 (SUTURE) ×3 IMPLANT
SUT MON AB 2-0 CT1 36 (SUTURE) ×3 IMPLANT
SUT VIC AB 0 CT1 27 (SUTURE)
SUT VIC AB 0 CT1 27XBRD ANBCTR (SUTURE) IMPLANT
SUT VIC AB 1 CTX 36 (SUTURE) ×3
SUT VIC AB 1 CTX36XBRD ANBCTR (SUTURE) ×1 IMPLANT
TOWEL OR 17X24 6PK STRL BLUE (TOWEL DISPOSABLE) ×3 IMPLANT
TOWEL OR 17X26 10 PK STRL BLUE (TOWEL DISPOSABLE) ×3 IMPLANT
WATER STERILE IRR 1000ML POUR (IV SOLUTION) ×3 IMPLANT

## 2014-06-30 NOTE — Progress Notes (Signed)
From PACU alert oriented rt hip DDI with ice pack 97.7 59 14 123/57 100% on 2l put back on tele.

## 2014-06-30 NOTE — Anesthesia Postprocedure Evaluation (Signed)
Anesthesia Post Note  Patient: Christopher [name]  Procedure(s) Performed: Procedure(s) (LRB): CANNULATED HIP PINNING (Right)  Anesthesia type: general  Patient location: PACU  Post pain: Pain level controlled  Post assessment: Patient's Cardiovascular Status Stable  Last Vitals:  Filed Vitals:   06/30/14 1358  BP: 123/52  Pulse:   Temp: 36.5 C  Resp: 14    Post vital signs: Reviewed and stable  Level of consciousness: sedated  Complications: No apparent anesthesia complications

## 2014-06-30 NOTE — Transfer of Care (Signed)
Immediate Anesthesia Transfer of Care Note  Patient: Christopher [name]  Procedure(s) Performed: Procedure(s): CANNULATED HIP PINNING (Right)  Patient Location: PACU  Anesthesia Type:General  Level of Consciousness: awake, alert  and oriented  Airway & Oxygen Therapy: Patient Spontanous Breathing and Patient connected to nasal cannula oxygen  Post-op Assessment: Report given to RN and Post -op Vital signs reviewed and stable  Post vital signs: Reviewed and stable  Last Vitals:  Filed Vitals:   06/30/14 0900  BP: 113/49  Pulse: 66  Temp: 37.4 C  Resp: 18    Complications: No apparent anesthesia complications

## 2014-06-30 NOTE — Anesthesia Preprocedure Evaluation (Addendum)
Anesthesia Evaluation  Patient identified by MRN, date of birth, ID band Patient awake    Reviewed: Allergy & Precautions, NPO status , Patient's Chart, lab work & pertinent test results  Airway Mallampati: II  TM Distance: >3 FB Neck ROM: Full    Dental  (+) Teeth Intact, Dental Advisory Given   Pulmonary shortness of breath and with exertion, former smoker,    Pulmonary exam normal       Cardiovascular hypertension, Pt. on medications + CAD, + CABG and +CHF + dysrhythmias Atrial Fibrillation + pacemaker  ECHO 5/15 Study Conclusions  - Left ventricle: The cavity size was normal. Wall thickness was increased in a pattern of mild LVH. Systolic function was normal. The estimated ejection fraction was in the range of 55% to 60%. There is diastolic dysfunction, indeterminate grade. There is evidence of elevated LA pressure E/e&' 15. - Aortic valve: Mildly calcified annulus. Trileaflet; mildly thickened leaflets. Valve area (VTI): 2.66 cm^2. Valve area (Vmax): 2.27 cm^2. - Mitral valve: Mildly calcified annulus. Mildly thickened leaflets . There was mild regurgitation. - Left atrium: The atrium was moderately dilated. - Right atrium: The atrium was mildly dilated. - Tricuspid valve: There was mild-moderate regurgitation. - Pulmonic valve: There was moderate regurgitation. - Pulmonary arteries: PASP is at least 49 mmHg, unable to estimate RA pressure becaue IV not visualized. PASP is at least moderately elevated. - Technically difficult study.    Neuro/Psych    GI/Hepatic   Endo/Other  diabetes, Type 2, Oral Hypoglycemic Agents  Renal/GU Renal Insufficiency and CRFRenal disease     Musculoskeletal  (+) Arthritis -,   Abdominal   Peds  Hematology   Anesthesia Other Findings   Reproductive/Obstetrics                            Anesthesia Physical Anesthesia Plan  ASA:  III  Anesthesia Plan: General   Post-op Pain Management:    Induction: Intravenous  Airway Management Planned: Oral ETT  Additional Equipment:   Intra-op Plan:   Post-operative Plan: Extubation in OR  Informed Consent: I have reviewed the patients History and Physical, chart, labs and discussed the procedure including the risks, benefits and alternatives for the proposed anesthesia with the patient or authorized representative who has indicated his/her understanding and acceptance.   Dental advisory given  Plan Discussed with: Surgeon and CRNA  Anesthesia Plan Comments:        Anesthesia Quick Evaluation

## 2014-06-30 NOTE — Progress Notes (Signed)
[facility] PROGRESS NOTE  Christopher [name] SWF:[id number] DOB: April 30, 1926 DOA: 06/28/2014 PCP: Tivis Ringer, MD  Assessment/Plan: #1 right hip fracture Secondary to mechanical fall. Patient states pain is controlled. Patient has been seen in consultation by orthopedics and patient for surgery today. Continue current pain regimen. Continue to hold Coumadin. Per orthopedics.  #2 lightheadedness Likely secondary to orthostasis secondary to diuretics. Clinical improvement. Lasix on hold. Continue hydration.  #3 acute on chronic kidney disease stage IV Baseline creatinine 3-3.5. Patient's creatinine on admission was 3.62 with a BUN of 75. Likely secondary to prerenal azotemia. Diuretics on hold. Renal function improved and a trending down. Labs pending.  Creatinine was at 3.37 yesterday. Continue gentle hydration. Follow.  #4 leukocytosis Likely reactive leukocytosis. Patient is currently afebrile. Chest x-ray negative for any acute infiltrate. WBC trending down. Follow.  #5 atrial fibrillation Chads2Vasc score is 5. Patient on Coumadin at home. INR on admission was 1.66. Continue amiodarone and atenolol for rate control. Coumadin on hold in preparation for surgery. Orthopedics to advise when anticoagulation may be resumed.  #6 BPH Stable. Continue Proscar.  #7 diastolic heart failure 2-D echo from 07/24/2013 with a EF of 55-60%. Patient currently on oral Lasix. Patient seems compensated. No signs of volume overload. Continue to hold diuretics. Follow.  #8 well controlled diabetes mellitus Hemoglobin A1c = 6.1. CBGs 134-212. Continue sliding scale insulin.  #9 hyperlipidemia Fasting lipid with cholesterol 216 triglycerides of 186 LDL of 142 and HDL of 39. Patient with intolerances to statins and as such is not on a statin. Continue Lovaza.  #10 prophylaxis Heparin for DVT prophylaxis.  Code Status: Full Family Communication: Updated patient and wife at bedside. Disposition  Plan: Pending surgery. Likely needs skilled nursing facility.   Consultants:  Orthopedics: Dr. Edmonia Lynch 06/28/2014  Procedures:  CT head 06/28/2014  Chest x-ray 06/28/2014    Antibiotics:  None  HPI/Subjective: Patient denies dizziness. No CP. No SOB.  Objective: Filed Vitals:   06/30/14 0529  BP: 102/52  Pulse: 60  Temp: 98.8 F (37.1 C)  Resp: 18    Intake/Output Summary (Last 24 hours) at 06/30/14 0856 Last data filed at 06/29/14 1700  Gross per 24 hour  Intake    240 ml  Output    300 ml  Net    -60 ml   Filed Weights   06/28/14 1638 06/29/14 0500  Weight: 102.513 kg (226 lb) 102.604 kg (226 lb 3.2 oz)    Exam:   General:  NAD  Cardiovascular: RRR  Respiratory: CTAB anterior lung fields  Abdomen: Soft/NT/ND/+BS  Musculoskeletal: No c/c/e. RLE externally rotated and shortened.  Data Reviewed: Basic Metabolic Panel:  Recent Labs Lab 06/28/14 1655 06/29/14 0550  NA 140 140  K 4.3 4.7  CL 105 106  CO2 24 25  GLUCOSE 98 132*  BUN 75* 70*  CREATININE 3.62* 3.37*  CALCIUM 8.9 8.8   Liver Function Tests: No results for input(s): AST, ALT, ALKPHOS, BILITOT, PROT, ALBUMIN in the last 168 hours. No results for input(s): LIPASE, AMYLASE in the last 168 hours. No results for input(s): AMMONIA in the last 168 hours. CBC:  Recent Labs Lab 06/28/14 1655 06/29/14 0550  WBC 14.5* 14.0*  NEUTROABS 12.6*  --   HGB 10.8* 10.3*  HCT 34.2* 32.9*  MCV 90.2 91.6  PLT 382 334   Cardiac Enzymes: No results for input(s): CKTOTAL, CKMB, CKMBINDEX, TROPONINI in the last 168 hours. BNP (last 3 results)  Recent Labs  06/29/14 0550  BNP 628.4*    ProBNP (last 3 results)  Recent Labs  07/24/13 0120  PROBNP 9261.0*    CBG:  Recent Labs Lab 06/29/14 0635 06/29/14 1221 06/29/14 1627 06/29/14 2131 06/30/14 0617  GLUCAP 116* 147* 212* 175* 134*    No results found for this or any previous visit (from the past 240 hour(s)).    Studies: Dg Chest 2 View  06/28/2014   CLINICAL DATA:  Near syncope, hip pain  EXAM: CHEST  2 VIEW  COMPARISON:  07/24/2013  FINDINGS: There is elevation of the right diaphragm. There is bibasilar atelectasis versus scarring. There is no focal consolidation, pleural effusion, or pneumothorax. The heart and mediastinum are stable. There is a dual lead cardiac pacer. There is evidence of prior CABG.  The osseous structures are unremarkable.  IMPRESSION: No active cardiopulmonary disease.   Electronically Signed   By: Kathreen Devoid   On: 06/28/2014 18:01   Ct Head Wo Contrast  06/28/2014   CLINICAL DATA:  Episode of acute dizziness with fall resulting in trauma to the right side of the head. Anticoagulation it.  EXAM: CT HEAD WITHOUT CONTRAST  TECHNIQUE: Contiguous axial images were obtained from the base of the skull through the vertex without intravenous contrast.  COMPARISON:  None.  FINDINGS: There is generalized age related atrophy. There are areas of low density in the deep white matter consistent with mild chronic small vessel disease. No sign of acute infarction, mass lesion, hemorrhage, hydrocephalus or extra-axial collection. No skull fracture. No fluid in the sinuses, middle ears or mastoids.  IMPRESSION: No acute or traumatic finding. Age related atrophy and mild chronic small vessel disease.   Electronically Signed   By: Nelson Chimes M.D.   On: 06/28/2014 18:11   Dg Hip Unilat With Pelvis 2-3 Views Right  06/28/2014   CLINICAL DATA:  Syncope, hip pain  EXAM: RIGHT HIP (WITH PELVIS) 2-3 VIEWS  COMPARISON:  None.  FINDINGS: There is a nondisplaced right femoral neck fracture. There is no other fracture or dislocation.  IMPRESSION: Nondisplaced, right femoral neck fracture.   Electronically Signed   By: Kathreen Devoid   On: 06/28/2014 18:03   Dg Femur, Min 2 Views Right  06/28/2014   CLINICAL DATA:  Syncope, hip pain  EXAM: RIGHT FEMUR 2 VIEWS  COMPARISON:  None.  FINDINGS: There is a nondisplaced  right femoral neck fracture. There is no hip dislocation. There is peripheral vascular atherosclerotic disease. Soft tissues are unremarkable.  IMPRESSION: Nondisplaced, right femoral neck fracture.   Electronically Signed   By: Kathreen Devoid   On: 06/28/2014 18:03    Scheduled Meds: . amiodarone  200 mg Oral q morning - 10a  . atenolol  25 mg Oral q morning - 10a  . bacitracin   Topical BID  .  ceFAZolin (ANCEF) IV  2 g Intravenous On Call to OR  . docusate sodium  100 mg Oral BID  . finasteride  5 mg Oral Daily  . folic acid  149 mcg Oral Daily  . heparin  5,000 Units Subcutaneous 3 times per day  . insulin aspart  0-9 Units Subcutaneous TID WC  . multivitamin with minerals  1 tablet Oral Daily  . omega-3 acid ethyl esters  1 g Oral Daily  . sodium chloride  3 mL Intravenous Q12H   Continuous Infusions: . dextrose 5 % and 0.45% NaCl 100 mL/hr (06/30/14 0418)    Principal Problem:   Closed right hip fracture Active Problems:  CAD (coronary artery disease)   Cardiac pacemaker in situ   Atrial fibrillation   Lumbar disc disease   Hyperlipidemia   Kidney disease, chronic, stage III (GFR 30-59 ml/min)   Long-term (current) use of anticoagulants   BPH (benign prostatic hyperplasia)   DM type 2 causing renal disease   CHF (congestive heart failure)   Hip fracture    Time spent: 31 minutes    THOMPSON,DANIEL M.D. [facility] Pager [phone]. If 7PM-7AM, please contact night-coverage at [email or web address], password [facility] 06/30/2014, 8:56 AM  LOS: 2 days

## 2014-06-30 NOTE — Op Note (Signed)
06/28/2014 - 06/30/2014  11:45 AM  PATIENT:  Christopher [name]    PRE-OPERATIVE DIAGNOSIS:  RIGHT HIP FRACTURE  POST-OPERATIVE DIAGNOSIS:  Same  PROCEDURE:  CANNULATED HIP PINNING  SURGEON:  Aeryn Medici, Ernesta Amble, MD  ASSISTANT: Lovett Calender, PA-C, She was present and scrubbed throughout the case, critical for completion in a timely fashion, and for retraction, instrumentation, and closure.   ANESTHESIA:   General  PREOPERATIVE INDICATIONS:  Christopher [name] is a  81 y.o. male who fell and was found to have a diagnosis of RIGHT HIP FRACTURE who elected for surgical management.    The risks benefits and alternatives were discussed with the patient preoperatively including but not limited to the risks of infection, bleeding, nerve injury, cardiopulmonary complications, blood clots, malunion, nonunion, avascular necrosis, the need for revision surgery, the potential for conversion to hemiarthroplasty, among others, and the patient was willing to proceed.  OPERATIVE IMPLANTS: 6.5 mm cannulated screws x3  OPERATIVE FINDINGS: Clinical osteoporosis with weak bone, proximal femur  OPERATIVE PROCEDURE: The patient was brought to the operating room and placed in supine position. IV antibiotics were given. General anesthesia administered. Foley was also given. The patient was placed on the fracture table. The operative extremity was positioned, without any significant reduction maneuver and was prepped and draped in usual sterile fashion.  Time out was performed.  Small incisions were made distal to the greater trochanter, and 3 guidewires were introduced Into an inverted triangle configuration. The lengths were measured. The reduction was slightly valgus, and near-anatomic. I opened the cortex with a cannulated drill, and then placed the screws into position. Satisfactory fixation was achieved. I sequentially tightened the screws by hand.  I performed a live fluoroscopic exam and no screw penetrance was  noted. All threads crossed the fracture site.   The wounds were irrigated copiously, and repaired with Vicryl with Steri-Strips and sterile gauze. There no complications and the patient tolerated the procedure well.  The patient will be weightbearing as tolerated, VTE prophylaxis will be: resume coumadin and bridge with lovenox   This note was generated using a template and dragon dictation system. In light of that, I have reviewed the note and all aspects of it are applicable to this case. Any dictation errors are due to the computerized dictation system.

## 2014-06-30 NOTE — Discharge Instructions (Signed)
Weight bearing as tolerated in the RLE  Continue Lovenox with bridge to coumadin for DVT prophylaxis

## 2014-06-30 NOTE — Anesthesia Procedure Notes (Signed)
Procedure Name: Intubation Date/Time: 06/30/2014 10:49 AM Performed by: Susa Loffler Pre-anesthesia Checklist: Patient identified, Timeout performed, Emergency Drugs available, Suction available and Patient being monitored Patient Re-evaluated:Patient Re-evaluated prior to inductionOxygen Delivery Method: Circle system utilized Preoxygenation: Pre-oxygenation with 100% oxygen Intubation Type: IV induction Ventilation: Mask ventilation without difficulty Laryngoscope Size: Mac and 4 Grade View: Grade I Tube type: Oral Tube size: 7.5 mm Number of attempts: 1 Airway Equipment and Method: Stylet Placement Confirmation: ETT inserted through vocal cords under direct vision,  positive ETCO2 and breath sounds checked- equal and bilateral Secured at: 23 cm Tube secured with: Tape Dental Injury: Teeth and Oropharynx as per pre-operative assessment

## 2014-07-01 ENCOUNTER — Encounter (HOSPITAL_COMMUNITY): Payer: Self-pay | Admitting: Orthopedic Surgery

## 2014-07-01 DIAGNOSIS — Z950 Presence of cardiac pacemaker: Secondary | ICD-10-CM

## 2014-07-01 DIAGNOSIS — E785 Hyperlipidemia, unspecified: Secondary | ICD-10-CM

## 2014-07-01 DIAGNOSIS — I482 Chronic atrial fibrillation: Secondary | ICD-10-CM

## 2014-07-01 DIAGNOSIS — S72142A Displaced intertrochanteric fracture of left femur, initial encounter for closed fracture: Secondary | ICD-10-CM | POA: Diagnosis present

## 2014-07-01 DIAGNOSIS — S72001A Fracture of unspecified part of neck of right femur, initial encounter for closed fracture: Secondary | ICD-10-CM

## 2014-07-01 DIAGNOSIS — M519 Unspecified thoracic, thoracolumbar and lumbosacral intervertebral disc disorder: Secondary | ICD-10-CM

## 2014-07-01 DIAGNOSIS — E1121 Type 2 diabetes mellitus with diabetic nephropathy: Secondary | ICD-10-CM

## 2014-07-01 LAB — URINE CULTURE

## 2014-07-01 LAB — GLUCOSE, CAPILLARY
Glucose-Capillary: 136 mg/dL — ABNORMAL HIGH (ref 70–99)
Glucose-Capillary: 160 mg/dL — ABNORMAL HIGH (ref 70–99)
Glucose-Capillary: 172 mg/dL — ABNORMAL HIGH (ref 70–99)
Glucose-Capillary: 150 mg/dL — ABNORMAL HIGH (ref 70–99)

## 2014-07-01 LAB — BASIC METABOLIC PANEL
ANION GAP: 9 (ref 5–15)
BUN: 62 mg/dL — ABNORMAL HIGH (ref 6–23)
CO2: 23 mmol/L (ref 19–32)
Calcium: 8.5 mg/dL (ref 8.4–10.5)
Chloride: 105 mmol/L (ref 96–112)
Creatinine, Ser: 3.14 mg/dL — ABNORMAL HIGH (ref 0.50–1.35)
GFR, EST AFRICAN AMERICAN: 19 mL/min — AB (ref >90)
GFR, EST NON AFRICAN AMERICAN: 17 mL/min — AB (ref >90)
GLUCOSE: 135 mg/dL — AB (ref 70–99)
Potassium: 5.3 mmol/L — ABNORMAL HIGH (ref 3.5–5.1)
Sodium: 137 mmol/L (ref 135–145)

## 2014-07-01 LAB — PROTIME-INR
INR: 1.35 (ref 0.00–1.49)
Prothrombin Time: 16.8 seconds — ABNORMAL HIGH (ref 11.6–15.2)

## 2014-07-01 LAB — CBC WITH DIFFERENTIAL/PLATELET
Basophils Absolute: 0 10*3/uL (ref 0.0–0.1)
Basophils Relative: 0 % (ref 0–1)
EOS ABS: 0.1 10*3/uL (ref 0.0–0.7)
EOS PCT: 1 % (ref 0–5)
HEMATOCRIT: 30.1 % — AB (ref 39.0–52.0)
Hemoglobin: 9.3 g/dL — ABNORMAL LOW (ref 13.0–17.0)
LYMPHS ABS: 0.9 10*3/uL (ref 0.7–4.0)
Lymphocytes Relative: 8 % — ABNORMAL LOW (ref 12–46)
MCH: 28.4 pg (ref 26.0–34.0)
MCHC: 30.9 g/dL (ref 30.0–36.0)
MCV: 92 fL (ref 78.0–100.0)
MONO ABS: 0.7 10*3/uL (ref 0.1–1.0)
MONOS PCT: 6 % (ref 3–12)
Neutro Abs: 9.8 10*3/uL — ABNORMAL HIGH (ref 1.7–7.7)
Neutrophils Relative %: 85 % — ABNORMAL HIGH (ref 43–77)
Platelets: 294 10*3/uL (ref 150–400)
RBC: 3.27 MIL/uL — ABNORMAL LOW (ref 4.22–5.81)
RDW: 16.8 % — ABNORMAL HIGH (ref 11.5–15.5)
WBC: 11.4 10*3/uL — ABNORMAL HIGH (ref 4.0–10.5)

## 2014-07-01 MED ORDER — WARFARIN - PHARMACIST DOSING INPATIENT: Freq: Every day | Status: DC

## 2014-07-01 NOTE — Progress Notes (Signed)
Rehab Admissions Coordinator Note:  Patient was screened by Cleatrice Burke for appropriateness for an Inpatient Acute Rehab Consult per PT recommendation.  At this time, we are recommending Inpatient Rehab consult. I will contact MD for order.  Cleatrice Burke 07/01/2014, 2:13 PM  I can be reached at [phone].

## 2014-07-01 NOTE — Progress Notes (Signed)
Physical Therapy Treatment/Re-evaluation Patient Details Name: Christopher [name] MRN: [id number] DOB: 06/29/1926 Today's Date: 07/01/2014    History of Present Illness Patient is an 81 y/o male admitted s/p fall with right hip femoral neck fracture.  Awaiting surgery due to on anticoagulation.  PMH positive for HTN, DM, CABG 2008, SSS s/p PPM, BPH, CKD, h/o prostate CA, A-fib.    PT Comments    Patient presents s/p surgical fixation of right hip with cannulated pinning on 06/30/14.  He presents with continued limitations in strength, ROM, balance, functional mobility, knowledge of DME and precaution and limited activity tolerance with some orthostatic BP changes noted this session as well.  He will benefit from skilled PT in the acute setting to continue to address issues and return home with family assist following CIR rehab stay.  Follow Up Recommendations  CIR;Supervision/Assistance - 24 hour     Equipment Recommendations  Rolling walker with 5" wheels    Recommendations for Other Services       Precautions / Restrictions Precautions Precautions: Fall Precaution Comments: watch BP Restrictions RLE Weight Bearing: Weight bearing as tolerated    Mobility  Bed Mobility Overal bed mobility: Needs Assistance       Supine to sit: Mod assist;Max assist;HOB elevated     General bed mobility comments: assist to move feet off bed and lift to upright and scoot to edge of bed  Transfers Overall transfer level: Needs assistance Equipment used: Rolling walker (2 wheeled) Transfers: Stand Pivot Transfers Sit to Stand: From elevated surface;Mod assist Stand pivot transfers: Mod assist       General transfer comment: increased time to rise, pt requested to try with hands on walker due to unsuccessful with hands on bed; so held walker for safety; pivotal steps to chair with walker, cues for sequence, weight on walker, etc  Ambulation/Gait                 Stairs             Wheelchair Mobility    Modified Rankin (Stroke Patients Only)       Balance Overall balance assessment: Needs assistance         Standing balance support: Bilateral upper extremity supported Standing balance-Leahy Scale: Poor Standing balance comment: standing with UE support, needed at least min assist for safety due to fatigue and sinking a little with postural weakness                    Cognition Arousal/Alertness: Awake/alert Behavior During Therapy: WFL for tasks assessed/performed Overall Cognitive Status: Within Functional Limits for tasks assessed                      Exercises Total Joint Exercises Ankle Circles/Pumps: AROM;Both;10 reps;Supine Quad Sets: AROM;Right;10 reps;Supine Heel Slides: AAROM;Left;5 reps    General Comments        Pertinent Vitals/Pain Pain Score: 7  Pain Location: right hip (worse with movement) Pain Descriptors / Indicators: Sore Pain Intervention(s): Monitored during session;Premedicated before session;Repositioned    Home Living                      Prior Function            PT Goals (current goals can now be found in the care plan section) Progress towards PT goals: Progressing toward goals    Frequency  Min 3X/week    PT Plan Current plan remains appropriate  Co-evaluation             End of Session Equipment Utilized During Treatment: Gait belt Activity Tolerance: Patient tolerated treatment well Patient left: in bed;with call bell/phone within reach     Time: 1119-1150 PT Time Calculation (min) (ACUTE ONLY): 31 min  Charges:  $Therapeutic Activity: 8-22 mins                    G Codes:      WYNN,CYNDI 2014-07-22, 12:19 PM Magda Kiel, [place] 2014/07/22

## 2014-07-01 NOTE — Progress Notes (Signed)
     Subjective:  POD#1 Right hip pinning. Patient reports pain as mild.  Resting comfortably in bed.  Objective:   VITALS:   Filed Vitals:   07/01/14 0000 07/01/14 0031 07/01/14 0400 07/01/14 0408  BP:  110/60  110/61  Pulse:  61  63  Temp:  98.9 F (37.2 C)  98.8 F (37.1 C)  TempSrc:  Oral  Oral  Resp: 16 16 16 16   Height:      Weight:    111.54 kg (245 lb 14.4 oz)  SpO2: 95% 94% 95% 94%    Neurologically intact ABD soft Neurovascular intact Sensation intact distally Intact pulses distally Dorsiflexion/Plantar flexion intact Incision: dressing C/D/I   Lab Results  Component Value Date   WBC 11.0* 06/30/2014   HGB 9.7* 06/30/2014   HCT 31.5* 06/30/2014   MCV 91.8 06/30/2014   PLT 290 06/30/2014   BMET    Component Value Date/Time   NA 138 06/30/2014 0750   K 4.9 06/30/2014 0750   CL 106 06/30/2014 0750   CO2 22 06/30/2014 0750   GLUCOSE 124* 06/30/2014 0750   BUN 64* 06/30/2014 0750   CREATININE 2.98* 06/30/2014 0750   CALCIUM 8.6 06/30/2014 0750   GFRNONAA 18* 06/30/2014 0750   GFRAA 21* 06/30/2014 0750     Assessment/Plan: 1 Day Post-Op   Principal Problem:   Closed right hip fracture Active Problems:   CAD (coronary artery disease)   Cardiac pacemaker in situ   Atrial fibrillation   Lumbar disc disease   Hyperlipidemia   Kidney disease, chronic, stage III (GFR 30-59 ml/min)   Long-term (current) use of anticoagulants   BPH (benign prostatic hyperplasia)   DM type 2 causing renal disease   CHF (congestive heart failure)   Hip fracture   Up with therapy WBAT in the RLE Coumadin with lovenox bridge for DVT prophylaxis   Faatima Tench Marie 07/01/2014, 7:44 AM Cell [phone]

## 2014-07-01 NOTE — Progress Notes (Signed)
[facility] PROGRESS NOTE  Christopher ROTHBAUER ZHY:[id number] DOB: 07/26/1926 DOA: 06/28/2014 PCP: Tivis Ringer, MD  Assessment/Plan: #1 right hip fracture Secondary to mechanical fall. Patient states pain is controlled. Patient is status post cannulated hip pinning 06/30/2014 per Dr. Percell Miller. Continue current pain regimen. Coumadin has been resumed. Per orthopedics.  #2 lightheadedness Likely secondary to orthostasis secondary to diuretics. Clinical improvement. Lasix on hold. Saline lock IV fluids..  #3 acute on chronic kidney disease stage IV Baseline creatinine 3-3.5. Patient's creatinine on admission was 3.62 with a BUN of 75. Likely secondary to prerenal azotemia. Diuretics on hold. Renal function improved and a trending down. Labs pending.  Creatinine is 3.14 today from 3.37. Cellulitis IV fluids. Will likely resume diuretics in the next 1-2 days.   #4 leukocytosis Likely reactive leukocytosis. Patient is currently afebrile. Chest x-ray negative for any acute infiltrate. WBC trending down. Follow.  #5 atrial fibrillation Chads2Vasc score is 5. Patient on Coumadin at home. INR on admission was 1.66. Continue amiodarone and atenolol for rate control. Coumadin has been resumed per orthopedics.   #6 BPH Stable. Continue Proscar.  #7 diastolic heart failure 2-D echo from 07/24/2013 with a EF of 55-60%. Patient currently on oral Lasix. Patient seems compensated. No signs of volume overload. Continue to hold diuretics and resume in 1-2 days. Follow.  #8 well controlled diabetes mellitus Hemoglobin A1c = 6.1. CBGs 136-172. Continue sliding scale insulin.  #9 hyperlipidemia Fasting lipid with cholesterol 216 triglycerides of 186 LDL of 142 and HDL of 39. Patient with intolerances to statins and as such is not on a statin. Continue Lovaza.  #10 prophylaxis Lovenox for DVT prophylaxis.  Code Status: Full Family Communication: Updated patient and wife at bedside. Disposition Plan:  Likely needs CIR vs skilled nursing facility.   Consultants:  Orthopedics: Dr. Edmonia Lynch 06/28/2014  Procedures:  CT head 06/28/2014  Chest x-ray 06/28/2014  Cannulated hip pinning 06/30/2014 Dr. Edmonia Lynch  Antibiotics:  None  HPI/Subjective: Patient sleeping easily arousable. Patient denies chest pain. Patient denies shortness of breath. Patient denies dizziness.   Objective: Filed Vitals:   07/01/14 0408  BP: 110/61  Pulse: 63  Temp: 98.8 F (37.1 C)  Resp: 16    Intake/Output Summary (Last 24 hours) at 07/01/14 1148 Last data filed at 07/01/14 0619  Gross per 24 hour  Intake    570 ml  Output    575 ml  Net     -5 ml   Filed Weights   06/28/14 1638 06/29/14 0500 07/01/14 0408  Weight: 102.513 kg (226 lb) 102.604 kg (226 lb 3.2 oz) 111.54 kg (245 lb 14.4 oz)    Exam:   General:  NAD  Cardiovascular: RRR  Respiratory: CTAB anterior lung fields  Abdomen: Soft/NT/ND/+BS  Musculoskeletal: No c/c/e.   Data Reviewed: Basic Metabolic Panel:  Recent Labs Lab 06/28/14 1655 06/29/14 0550 06/30/14 0750 07/01/14 0846  NA 140 140 138 137  K 4.3 4.7 4.9 5.3*  CL 105 106 106 105  CO2 24 25 22 23   GLUCOSE 98 132* 124* 135*  BUN 75* 70* 64* 62*  CREATININE 3.62* 3.37* 2.98* 3.14*  CALCIUM 8.9 8.8 8.6 8.5   Liver Function Tests: No results for input(s): AST, ALT, ALKPHOS, BILITOT, PROT, ALBUMIN in the last 168 hours. No results for input(s): LIPASE, AMYLASE in the last 168 hours. No results for input(s): AMMONIA in the last 168 hours. CBC:  Recent Labs Lab 06/28/14 1655 06/29/14 0550 06/30/14 0750 07/01/14 2952  WBC 14.5* 14.0* 11.0* 11.4*  NEUTROABS 12.6*  --   --  9.8*  HGB 10.8* 10.3* 9.7* 9.3*  HCT 34.2* 32.9* 31.5* 30.1*  MCV 90.2 91.6 91.8 92.0  PLT 382 334 290 294   Cardiac Enzymes: No results for input(s): CKTOTAL, CKMB, CKMBINDEX, TROPONINI in the last 168 hours. BNP (last 3 results)  Recent Labs  06/29/14 0550   BNP 628.4*    ProBNP (last 3 results)  Recent Labs  07/24/13 0120  PROBNP 9261.0*    CBG:  Recent Labs Lab 06/30/14 1231 06/30/14 1642 06/30/14 2105 07/01/14 0629 07/01/14 1118  GLUCAP 107* 143* 149* 136* 160*    Recent Results (from the past 240 hour(s))  Culture, Urine     Status: None   Collection Time: 06/29/14  9:47 PM  Result Value Ref Range Status   Specimen Description URINE, RANDOM  Final   Special Requests NONE  Final   Colony Count   Final    2,000 COLONIES/ML Performed at [facility]    Culture   Final    INSIGNIFICANT GROWTH Performed at [facility]    Report Status 07/01/2014 FINAL  Final  Surgical pcr screen     Status: None   Collection Time: 06/30/14  7:51 AM  Result Value Ref Range Status   MRSA, PCR NEGATIVE NEGATIVE Final   Staphylococcus aureus NEGATIVE NEGATIVE Final    Comment:        The Xpert SA Assay (FDA approved for NASAL specimens in patients over 52 years of age), is one component of a comprehensive surveillance program.  Test performance has been validated by [facility] for patients greater than or equal to 87 year old. It is not intended to diagnose infection nor to guide or monitor treatment.      Studies: Pelvis Portable  06/30/2014   CLINICAL DATA:  Status post ORIF of the fracture of the right femoral neck  EXAM: PORTABLE PELVIS 1-2 VIEWS  COMPARISON:  Preoperative exam of June 28, 2014  FINDINGS: The patient has undergone placement of 3 screws for fixation of the right femoral neck fracture. Alignment of the fracture is near anatomic  IMPRESSION: ORIF for right femoral neck fracture without evidence of immediate postprocedure complication.   Electronically Signed   By: David  Martinique M.D.   On: 06/30/2014 13:26   Dg Hip Operative Unilat With Pelvis Right  06/30/2014   CLINICAL DATA:  Femoral neck fracture.  EXAM: OPERATIVE RIGHT HIP (WITH PELVIS IF PERFORMED)  VIEWS  TECHNIQUE: Fluoroscopic spot  image(s) were submitted for interpretation post-operatively.  FLUOROSCOPY TIME:  Fluoroscopy Time:  0 minutes 45 seconds  Number of Acquired Images:  2 images  COMPARISON:  None.  FINDINGS: Patient status post right femoral neck pinning. Good anatomic alignment. Hardware intact.  IMPRESSION: ORIF right hip.   Electronically Signed   By: Marcello Moores  Register   On: 06/30/2014 13:05    Scheduled Meds: . amiodarone  200 mg Oral q morning - 10a  . atenolol  25 mg Oral q morning - 10a  . bacitracin   Topical BID  . docusate sodium  100 mg Oral BID  . enoxaparin (LOVENOX) injection  30 mg Subcutaneous Q24H  . finasteride  5 mg Oral Daily  . folic acid  982 mcg Oral Daily  . insulin aspart  0-9 Units Subcutaneous TID WC  . multivitamin with minerals  1 tablet Oral Daily  . omega-3 acid ethyl esters  1 g Oral  Daily  . sodium chloride  3 mL Intravenous Q12H  . warfarin  4 mg Oral QPM  . Warfarin - Pharmacist Dosing Inpatient   Does not apply q1800   Continuous Infusions:    Principal Problem:   Closed right hip fracture Active Problems:   CAD (coronary artery disease)   Cardiac pacemaker in situ   Atrial fibrillation   Lumbar disc disease   Hyperlipidemia   Kidney disease, chronic, stage III (GFR 30-59 ml/min)   Long-term (current) use of anticoagulants   BPH (benign prostatic hyperplasia)   DM type 2 causing renal disease   CHF (congestive heart failure)   Hip fracture    Time spent: 32 minutes    THOMPSON,DANIEL M.D. [facility] Pager [phone]. If 7PM-7AM, please contact night-coverage at [email or web address], password [facility] 07/01/2014, 11:48 AM  LOS: 3 days

## 2014-07-01 NOTE — Consult Note (Signed)
Physical Medicine and Rehabilitation Consult Reason for Consult: Right hip fracture Referring Physician: [facility]   HPI: Christopher [name] is a 81 y.o. right handed male with history of hypertension, diabetes mellitus and peripheral neuropathy, CAD with CABG/pacemaker, chronic kidney disease with baseline creatinine 3.62 and atrial fibrillation with chronic Coumadin therapy. Patient lives with his wife independent with a cane prior to admission. Admitted 06/28/2014 after a fall without loss of consciousness. He did note some lightheadedness when he stood from the sitting position following forward on his right hip. Cranial CT scan was negative. X-rays of right hip showed a nondisplaced femoral neck fracture. INR on admission of 1.66. Underwent cannulated hip pinning 06/30/2014 per Dr. Percell Miller. Weightbearing as tolerated right lower extremity. Chronic Coumadin has been resumed. Hospital course pain management. Acute blood loss anemia 9.3 and monitored. Physical therapy evaluation completed with recommendations of physical medicine rehabilitation consult.  Patient is awake sitting in bed conversant denies pain at the current time Review of Systems  Cardiovascular: Positive for palpitations.  Gastrointestinal: Positive for constipation.  Genitourinary: Positive for urgency.  Musculoskeletal: Positive for back pain.  All other systems reviewed and are negative.  Past Medical History  Diagnosis Date  . Lumbar disc disease   . BPH (benign prostatic hyperplasia)   . HTN (hypertension)   . Diabetes mellitus type II   . Sinus node dysfunction     symptomatic bradycardia  . Pacemaker     Medtronic-dual-chamber-DOI 2012  . CAD (coronary artery disease)     followed by Dr. Wynonia Lawman  . Arthritis   . History of skin cancer   . Frequency of urination   . Nocturia   . Chronic kidney disease     kidneys function at "20 %" followed by Dr. Mercy Moore  . Cancer     history of skin cancer  . Dysrhythmia      a fib  . Prostate cancer   . Ruptured disk 1992  . Skin cancer    Past Surgical History  Procedure Laterality Date  . Coronary artery bypass graft  2003    x3  . Cardiac catheterization    . Lumbar laminectomy  11/01  . Cardioversion  08/08/2011    Procedure: CARDIOVERSION;  Surgeon: Jacolyn Reedy, MD;  Location: [facility];  Service: Cardiovascular;  Laterality: N/A;  . Cardioversion N/A 07/16/2012    Procedure: CARDIOVERSION;  Surgeon: Jacolyn Reedy, MD;  Location: [facility];  Service: Cardiovascular;  Laterality: N/A;  . Pacemaker insertion  2012  . Cataracts removed    . Cystoscopy with biopsy N/A 06/21/2013    Procedure: CYSTOSCOPY WITH BIOPSY;  Surgeon: Molli Hazard, MD;  Location: [facility];  Service: Urology;  Laterality: N/A;    TUR RESECTION OF POLYP BILATERAL RETROGRADE PYELOGRAM BLADDER BIOPSY    . Prostate biopsy N/A 06/21/2013    Procedure: BIOPSY TRANSRECTAL ULTRASONIC PROSTATE (TUBP);  Surgeon: Molli Hazard, MD;  Location: [facility];  Service: Urology;  Laterality: N/A;  PROSTATE NERVE BLOCK  . Appendectomy  1980  . Hip pinning,cannulated Right 06/30/2014    Procedure: CANNULATED HIP PINNING;  Surgeon: Renette Butters, MD;  Location: [facility];  Service: Orthopedics;  Laterality: Right;   Family History  Problem Relation Age of Onset  . Cancer Mother     breast  . Cancer Father     prostate, esophagus   Social History:  reports that he quit smoking about 46 years ago. His smoking use  included Cigarettes. He smoked 0.75 packs per day. He has never used smokeless tobacco. He reports that he does not drink alcohol or use illicit drugs. Allergies:  Allergies  Allergen Reactions  . Crestor [Rosuvastatin] Other (See Comments)    Aches in joint   . Lipitor [Atorvastatin] Other (See Comments)    Joint aching  . Penicillins Rash  . Tramadol Nausea Only  . Vicodin [Hydrocodone-Acetaminophen] Anxiety    "Made me nervous"   Medications Prior to  Admission  Medication Sig Dispense Refill  . amiodarone (PACERONE) 200 MG tablet Take 200 mg by mouth every morning.    Marland Kitchen atenolol (TENORMIN) 25 MG tablet Take 25 mg by mouth every morning.     . finasteride (PROSCAR) 5 MG tablet Take 5 mg by mouth daily.      . folic acid (FOLVITE) 466 MCG tablet Take 400 mcg by mouth daily.      . furosemide (LASIX) 40 MG tablet Take 1.5 tablets (60 mg total) by mouth 2 (two) times daily. (Patient taking differently: Take 40 mg by mouth 2 (two) times daily. ) 90 tablet 1  . glimepiride (AMARYL) 1 MG tablet Take 1 mg by mouth daily before breakfast.      . Glucosamine HCl 1500 MG TABS Take 1,500 mg by mouth 2 (two) times daily.     . Horse Chestnut 300 MG CAPS Take 1 capsule by mouth daily.     . Multiple Vitamin (MULTIVITAMIN WITH MINERALS) TABS tablet Take 1 tablet by mouth daily.    . Omega-3 Fatty Acids (FISH OIL) 1200 MG CAPS Take 1,200 mg by mouth daily.    Marland Kitchen warfarin (COUMADIN) 4 MG tablet Take 4 mg by mouth every evening.    . hyoscyamine (LEVSIN, ANASPAZ) 0.125 MG tablet Take 1 tablet (0.125 mg total) by mouth every 4 (four) hours as needed (bladder spasms). 40 tablet 4    Home: Home Living Family/patient expects to be discharged to:: Private residence Living Arrangements: Spouse/significant other Available Help at Discharge: Family, Available 24 hours/day Type of Home: House Home Access: Stairs to enter Technical brewer of Steps: 2 Entrance Stairs-Rails: None Home Layout: One level Home Equipment: Cane - single point, Shower seat Additional Comments: tub shower  Functional History: Prior Function Level of Independence: Independent with assistive device(s) Functional Status:  Mobility: Bed Mobility Overal bed mobility: Needs Assistance Bed Mobility: Supine to Sit, Sit to Supine Supine to sit: Mod assist, Max assist, HOB elevated Sit to supine: +2 for physical assistance, Mod assist General bed mobility comments: assist to move  feet off bed and lift to upright and scoot to edge of bed Transfers Overall transfer level: Needs assistance Equipment used: Rolling walker (2 wheeled) Transfers: Stand Pivot Transfers Sit to Stand: From elevated surface, Mod assist Stand pivot transfers: Mod assist General transfer comment: increased time to rise, pt requested to try with hands on walker due to unsuccessful with hands on bed; so held walker for safety; pivotal steps to chair with walker, cues for sequence, weight on walker, etc Ambulation/Gait Ambulation/Gait assistance: Mod assist Ambulation Distance (Feet): 1 Feet Assistive device: Rolling walker (2 wheeled) General Gait Details: side steps up to head of bed x 3; cues for weight on hands with walker    ADL:    Cognition: Cognition Overall Cognitive Status: Within Functional Limits for tasks assessed Orientation Level: Oriented X4 Cognition Arousal/Alertness: Lethargic, Suspect due to medications Behavior During Therapy: WFL for tasks assessed/performed Overall Cognitive Status: Within Functional Limits  for tasks assessed  Blood pressure 110/61, pulse 63, temperature 98.8 F (37.1 C), temperature source Oral, resp. rate 16, height 6\' 2"  (1.88 m), weight 111.54 kg (245 lb 14.4 oz), SpO2 100 %. Physical Exam  Vitals reviewed. Constitutional: He is oriented to person, place, and time. He appears well-developed.  HENT:  Head: Normocephalic.  Eyes: EOM are normal.  Neck: Normal range of motion. Neck supple. No thyromegaly present.  Cardiovascular:  Cardiac rate controlled  Respiratory: Effort normal and breath sounds normal. No respiratory distress.  GI:  Mildly distended and non tender with diminished bowel sounds  Neurological: He is alert and oriented to person, place, and time.  Skin:  Hip incision clean and dry  Musculoskeletal right knee contusion with tenderness as well as Ecchymosis Right Hip and knee pain with lower extremity range of motion no pain  with ankle range of motion No pain with left lower extremity range of motion Upper extremity strength 5/5 bilateral deltoids, biceps, triceps, grip Left lower extremity 4/5 and hip flexor and extensor ankle dorsal flexion plantar flexion Trace right hip flexion knee extension 4 minus ankle dorsiflexion plantarflexion   Results for orders placed or performed during the hospital encounter of 06/28/14 (from the past 24 hour(s))  Glucose, capillary     Status: Abnormal   Collection Time: 06/30/14  4:42 PM  Result Value Ref Range   Glucose-Capillary 143 (H) 70 - 99 mg/dL   Comment 1 Repeat Test    Comment 2 Document in Chart   Glucose, capillary     Status: Abnormal   Collection Time: 06/30/14  9:05 PM  Result Value Ref Range   Glucose-Capillary 149 (H) 70 - 99 mg/dL  Glucose, capillary     Status: Abnormal   Collection Time: 07/01/14  6:29 AM  Result Value Ref Range   Glucose-Capillary 136 (H) 70 - 99 mg/dL  Protime-INR     Status: Abnormal   Collection Time: 07/01/14  6:50 AM  Result Value Ref Range   Prothrombin Time 16.8 (H) 11.6 - 15.2 seconds   INR 1.35 0.00 - 1.49  CBC with Differential/Platelet     Status: Abnormal   Collection Time: 07/01/14  8:46 AM  Result Value Ref Range   WBC 11.4 (H) 4.0 - 10.5 K/uL   RBC 3.27 (L) 4.22 - 5.81 MIL/uL   Hemoglobin 9.3 (L) 13.0 - 17.0 g/dL   HCT 30.1 (L) 39.0 - 52.0 %   MCV 92.0 78.0 - 100.0 fL   MCH 28.4 26.0 - 34.0 pg   MCHC 30.9 30.0 - 36.0 g/dL   RDW 16.8 (H) 11.5 - 15.5 %   Platelets 294 150 - 400 K/uL   Neutrophils Relative % 85 (H) 43 - 77 %   Neutro Abs 9.8 (H) 1.7 - 7.7 K/uL   Lymphocytes Relative 8 (L) 12 - 46 %   Lymphs Abs 0.9 0.7 - 4.0 K/uL   Monocytes Relative 6 3 - 12 %   Monocytes Absolute 0.7 0.1 - 1.0 K/uL   Eosinophils Relative 1 0 - 5 %   Eosinophils Absolute 0.1 0.0 - 0.7 K/uL   Basophils Relative 0 0 - 1 %   Basophils Absolute 0.0 0.0 - 0.1 K/uL  Basic metabolic panel     Status: Abnormal   Collection  Time: 07/01/14  8:46 AM  Result Value Ref Range   Sodium 137 135 - 145 mmol/L   Potassium 5.3 (H) 3.5 - 5.1 mmol/L   Chloride 105 96 -  112 mmol/L   CO2 23 19 - 32 mmol/L   Glucose, Bld 135 (H) 70 - 99 mg/dL   BUN 62 (H) 6 - 23 mg/dL   Creatinine, Ser 3.14 (H) 0.50 - 1.35 mg/dL   Calcium 8.5 8.4 - 10.5 mg/dL   GFR calc non Af Amer 17 (L) >90 mL/min   GFR calc Af Amer 19 (L) >90 mL/min   Anion gap 9 5 - 15  Glucose, capillary     Status: Abnormal   Collection Time: 07/01/14 11:18 AM  Result Value Ref Range   Glucose-Capillary 160 (H) 70 - 99 mg/dL   Comment 1 Repeat Test    Comment 2 Document in Chart    Pelvis Portable  06/30/2014   CLINICAL DATA:  Status post ORIF of the fracture of the right femoral neck  EXAM: PORTABLE PELVIS 1-2 VIEWS  COMPARISON:  Preoperative exam of June 28, 2014  FINDINGS: The patient has undergone placement of 3 screws for fixation of the right femoral neck fracture. Alignment of the fracture is near anatomic  IMPRESSION: ORIF for right femoral neck fracture without evidence of immediate postprocedure complication.   Electronically Signed   By: David  Martinique M.D.   On: 06/30/2014 13:26   Dg Hip Operative Unilat With Pelvis Right  06/30/2014   CLINICAL DATA:  Femoral neck fracture.  EXAM: OPERATIVE RIGHT HIP (WITH PELVIS IF PERFORMED)  VIEWS  TECHNIQUE: Fluoroscopic spot image(s) were submitted for interpretation post-operatively.  FLUOROSCOPY TIME:  Fluoroscopy Time:  0 minutes 45 seconds  Number of Acquired Images:  2 images  COMPARISON:  None.  FINDINGS: Patient status post right femoral neck pinning. Good anatomic alignment. Hardware intact.  IMPRESSION: ORIF right hip.   Electronically Signed   By: Marcello Moores  Register   On: 06/30/2014 13:05    Assessment/Plan: Diagnosis: Right femoral neck fracture 1. Does the need for close, 24 hr/day medical supervision in concert with the patient's rehab needs make it unreasonable for this patient to be served in a less  intensive setting? Yes 2. Co-Morbidities requiring supervision/potential complications: Coronary artery disease, atrial fibrillation status post permanent pacemaker, chronic kidney disease stage III, diabetes mellitus with renal disease,  3. Due to bladder management, bowel management, safety, skin/wound care, disease management, medication administration, pain management and patient education, does the patient require 24 hr/day rehab nursing? Yes 4. Does the patient require coordinated care of a physician, rehab nurse, PT (1-2 hrs/day, 5 days/week) and OT (1-2 hrs/day, 5 days/week) to address physical and functional deficits in the context of the above medical diagnosis(es)? Yes Addressing deficits in the following areas: balance, endurance, locomotion, strength, transferring, bowel/bladder control, bathing, dressing, feeding, grooming and toileting 5. Can the patient actively participate in an intensive therapy program of at least 3 hrs of therapy per day at least 5 days per week? Yes 6. The potential for patient to make measurable gains while on inpatient rehab is excellent 7. Anticipated functional outcomes upon discharge from inpatient rehab are modified independent  with PT, modified independent with OT, n/a with SLP. 8. Estimated rehab length of stay to reach the above functional goals is: 7-10 days 9. Does the patient have adequate social supports and living environment to accommodate these discharge functional goals? Yes 10. Anticipated D/C setting: Home 11. Anticipated post D/C treatments: [place] therapy 12. Overall Rehab/Functional Prognosis: excellent  RECOMMENDATIONS: This patient's condition is appropriate for continued rehabilitative care in the following setting: CIR Patient has agreed to participate in recommended program.  Yes Note that insurance prior authorization may be required for reimbursement for recommended care.  Comment: Postop day #1, rehabilitation Admissions coordinator  to follow progress    07/01/2014

## 2014-07-01 NOTE — Clinical Documentation Improvement (Signed)
Presented with fractured right femur after fall; hip pinning performed.   In Op Note, it states "clinical osteoporosis with weak bone proximal femur"  Please clarify if this is a: Pathological fracture           Other type           Cannot Clinically Determine  Thank You, Zoila Shutter ,RN Clinical Documentation Specialist:  [facility]

## 2014-07-01 NOTE — Progress Notes (Signed)
ANTICOAGULATION CONSULT NOTE - Initial Consult  Pharmacy Consult for Coumadin Indication: atrial fibrillation  Allergies  Allergen Reactions  . Crestor [Rosuvastatin] Other (See Comments)    Aches in joint   . Lipitor [Atorvastatin] Other (See Comments)    Joint aching  . Penicillins Rash  . Tramadol Nausea Only  . Vicodin [Hydrocodone-Acetaminophen] Anxiety    "Made me nervous"    Patient Measurements: Height: 6\' 2"  (188 cm) Weight: 245 lb 14.4 oz (111.54 kg) IBW/kg (Calculated) : 82.2  Vital Signs: Temp: 98.8 F (37.1 C) (04/22 0408) Temp Source: Oral (04/22 0408) BP: 110/61 mmHg (04/22 0408) Pulse Rate: 63 (04/22 0408)  Labs:  Recent Labs  06/28/14 1655 06/28/14 2153 06/29/14 0550 06/30/14 0750 07/01/14 0650 07/01/14 0846  HGB 10.8*  --  10.3* 9.7*  --  9.3*  HCT 34.2*  --  32.9* 31.5*  --  30.1*  PLT 382  --  334 290  --  294  APTT  --  35  --   --   --   --   LABPROT 19.7*  --   --  18.7* 16.8*  --   INR 1.66*  --   --  1.54* 1.35  --   CREATININE 3.62*  --  3.37* 2.98*  --  3.14*    Estimated Creatinine Clearance: 22.8 mL/min (by C-G formula based on Cr of 3.14).   Medical History: Past Medical History  Diagnosis Date  . Lumbar disc disease   . BPH (benign prostatic hyperplasia)   . HTN (hypertension)   . Diabetes mellitus type II   . Sinus node dysfunction     symptomatic bradycardia  . Pacemaker     Medtronic-dual-chamber-DOI 2012  . CAD (coronary artery disease)     followed by Dr. Wynonia Lawman  . Arthritis   . History of skin cancer   . Frequency of urination   . Nocturia   . Chronic kidney disease     kidneys function at "20 %" followed by Dr. Mercy Moore  . Cancer     history of skin cancer  . Dysrhythmia     a fib  . Prostate cancer   . Ruptured disk 1992  . Skin cancer     Assessment: 39 YOM on coumadin PTA for afib, admitted for hip fracture, s/p pinning. Pharmacy is consulted to manage resuming coumadin. Pt. Is also on lovenox 30  mg for VTE px. INR 1.35 today. Hgb 9.3, plt 294k. Pt. Received 4mg  last night.  PTA dose 4mg  daily.  Goal of Therapy:  INR 2-3 Monitor platelets by anticoagulation protocol: Yes   Plan:  - Coumadin 4mg  po daily per home dose. - F/u INR, D/c lovenox when INR > 2   Maryanna Shape, PharmD, BCPS  Clinical Pharmacist  Pager: [phone]   07/01/2014,10:48 AM

## 2014-07-01 NOTE — Evaluation (Signed)
Occupational Therapy Evaluation Patient Details Name: Christopher [name] MRN: [id number] DOB: [birth year] Today's Date: 07/01/2014    History of Present Illness Patient is an 81 y/o male admitted s/p fall with right hip femoral neck fracture.  Awaiting surgery due to on anticoagulation.  PMH positive for HTN, DM, CABG 2008, SSS s/p PPM, BPH, CKD, h/o prostate CA, A-fib.   Clinical Impression   Patient mod I PTA. Patient currently requires up to +2 for functional mobility for safety. Unable to complete full OT ADL evaluation secondary to patient with complaints of nausea and fatigue. Will continue to follow acutely. After reading PTs documentation and through verbal communication, recommending CIR at this time prior to patient d/c>home with wife. Patient will benefit from acute OT to increase overall independence in the areas of ADLs, functional mobility, and overall safety in order to safely discharge to venue listed below.     Follow Up Recommendations  CIR;Supervision/Assistance - 24 hour    Equipment Recommendations  Other (comment) (TBD next venue of care)    Recommendations for Other Services Rehab consult     Precautions / Restrictions Precautions Precautions: Fall Precaution Comments: watch BP Restrictions Weight Bearing Restrictions: Yes RLE Weight Bearing: Weight bearing as tolerated      Mobility - Per PT note Bed Mobility Overal bed mobility: Needs Assistance       Supine to sit: Mod assist;Max assist;HOB elevated     General bed mobility comments: assist to move feet off bed and lift to upright and scoot to edge of bed  Transfers Overall transfer level: Needs assistance Equipment used: Rolling walker (2 wheeled) Transfers: Stand Pivot Transfers Sit to Stand: From elevated surface;Mod assist Stand pivot transfers: Mod assist       General transfer comment: increased time to rise, pt requested to try with hands on walker due to unsuccessful with hands on bed;  so held walker for safety; pivotal steps to chair with walker, cues for sequence, weight on walker, etc    Balance - Per Pt note Overall balance assessment: Needs assistance         Standing balance support: Bilateral upper extremity supported Standing balance-Leahy Scale: Poor Standing balance comment: standing with UE support, needed at least min assist for safety due to fatigue and sinking a little with postural weakness     ADL Overall ADL's : Needs assistance/impaired General ADL Comments: Patient unable to tolerate this OT ADL session secondary to nausea and fatigue. Patient found supine in bed with onset of nausea during OT eval. WIll continue to follow from OT standpoint and from speaking with PT and reading PT notes, currently recommending CIR for rehab prior to d/c>home.     Pertinent Vitals/Pain Pain Assessment: No/denies pain Pain Score: 7  Pain Location: right hip (worse with movement) Pain Descriptors / Indicators: Sore Pain Intervention(s): Monitored during session;Premedicated before session;Repositioned     Hand Dominance Right   Extremity/Trunk Assessment Upper Extremity Assessment Upper Extremity Assessment: Overall WFL for tasks assessed   Lower Extremity Assessment Lower Extremity Assessment: Defer to PT evaluation   Cervical / Trunk Assessment Cervical / Trunk Assessment: Normal   Communication Communication Communication: No difficulties   Cognition Arousal/Alertness: Lethargic;Suspect due to medications Behavior During Therapy: [facility] for tasks assessed/performed Overall Cognitive Status: Within Functional Limits for tasks assessed              Home Living Family/patient expects to be discharged to:: Private residence Living Arrangements: Spouse/significant other Available Help at Discharge:  Family;Available 24 hours/day Type of Home: House Home Access: Stairs to enter [employer] of Steps: 2 Entrance Stairs-Rails: None Home  Layout: One level     Bathroom Shower/Tub: Tub/shower unit;Curtain   Biochemist, clinical: Standard     Home Equipment: Cane - single point;Shower seat   Prior Functioning/Environment Level of Independence: Independent with assistive device(s)      OT Diagnosis: Generalized weakness;Acute pain   OT Problem List: Decreased strength;Decreased activity tolerance;Impaired balance (sitting and/or standing);Decreased safety awareness;Decreased knowledge of use of DME or AE;Decreased knowledge of precautions;Pain   OT Treatment/Interventions: Self-care/ADL training;Energy conservation;DME and/or AE instruction;Therapeutic exercise;Therapeutic activities;Patient/family education;Balance training    OT Goals(Current goals can be found in the care plan section) Acute Rehab OT Goals Patient Stated Goal: go to rehab OT Goal Formulation: With patient Time For Goal Achievement: 07/08/14 Potential to Achieve Goals: Good ADL Goals Pt Will Perform Grooming: with min assist;standing Pt Will Perform Upper Body Bathing: with min guard assist;sitting Pt Will Perform Lower Body Bathing: with adaptive equipment;with min assist;sit to/from stand Pt Will Perform Upper Body Dressing: with min guard assist;sitting Pt Will Perform Lower Body Dressing: with min assist;with adaptive equipment;sit to/from stand Pt Will Transfer to Toilet: with min assist;bedside commode;ambulating Pt Will Perform Toileting - Clothing Manipulation and hygiene: with min guard assist;sit to/from stand  OT Frequency: Min 2X/week   Barriers to D/C: None known at this time   End of Session Activity Tolerance: Patient limited by fatigue (nausea) Patient left: in bed;with call bell/phone within reach;with family/visitor present   Time: 1173-5670 OT Time Calculation (min): 13 min Charges:  OT General Charges $OT Visit: 1 Procedure OT Evaluation $Initial OT Evaluation Tier I: 1 Procedure  Oval Cavazos , MS, OTR/L, CLT Pager:  [phone]  07/01/2014, 2:47 PM

## 2014-07-02 DIAGNOSIS — N189 Chronic kidney disease, unspecified: Secondary | ICD-10-CM

## 2014-07-02 DIAGNOSIS — E1122 Type 2 diabetes mellitus with diabetic chronic kidney disease: Secondary | ICD-10-CM

## 2014-07-02 LAB — PROTIME-INR
INR: 1.24 (ref 0.00–1.49)
Prothrombin Time: 15.7 seconds — ABNORMAL HIGH (ref 11.6–15.2)

## 2014-07-02 LAB — CBC
HCT: 29 % — ABNORMAL LOW (ref 39.0–52.0)
Hemoglobin: 9.1 g/dL — ABNORMAL LOW (ref 13.0–17.0)
MCH: 29 pg (ref 26.0–34.0)
MCHC: 31.4 g/dL (ref 30.0–36.0)
MCV: 92.4 fL (ref 78.0–100.0)
Platelets: 313 10*3/uL (ref 150–400)
RBC: 3.14 MIL/uL — ABNORMAL LOW (ref 4.22–5.81)
RDW: 17 % — ABNORMAL HIGH (ref 11.5–15.5)
WBC: 11.2 10*3/uL — ABNORMAL HIGH (ref 4.0–10.5)

## 2014-07-02 LAB — GLUCOSE, CAPILLARY
GLUCOSE-CAPILLARY: 145 mg/dL — AB (ref 70–99)
Glucose-Capillary: 150 mg/dL — ABNORMAL HIGH (ref 70–99)
Glucose-Capillary: 171 mg/dL — ABNORMAL HIGH (ref 70–99)

## 2014-07-02 LAB — BASIC METABOLIC PANEL
ANION GAP: 12 (ref 5–15)
BUN: 76 mg/dL — AB (ref 6–23)
CO2: 20 mmol/L (ref 19–32)
Calcium: 8.9 mg/dL (ref 8.4–10.5)
Chloride: 108 mmol/L (ref 96–112)
Creatinine, Ser: 3.63 mg/dL — ABNORMAL HIGH (ref 0.50–1.35)
GFR, EST AFRICAN AMERICAN: 16 mL/min — AB (ref >90)
GFR, EST NON AFRICAN AMERICAN: 14 mL/min — AB (ref >90)
GLUCOSE: 134 mg/dL — AB (ref 70–99)
Potassium: 5.2 mmol/L — ABNORMAL HIGH (ref 3.5–5.1)
Sodium: 140 mmol/L (ref 135–145)

## 2014-07-02 MED ORDER — WARFARIN SODIUM 4 MG PO TABS
4.0000 mg | ORAL_TABLET | Freq: Every evening | ORAL | Status: DC
  Administered 2014-07-03: 4 mg via ORAL
  Filled 2014-07-02 (×2): qty 1

## 2014-07-02 MED ORDER — FUROSEMIDE 20 MG PO TABS
20.0000 mg | ORAL_TABLET | Freq: Every day | ORAL | Status: DC
  Administered 2014-07-02: 20 mg via ORAL
  Filled 2014-07-02: qty 1

## 2014-07-02 MED ORDER — WARFARIN SODIUM 6 MG PO TABS
6.0000 mg | ORAL_TABLET | Freq: Once | ORAL | Status: AC
  Administered 2014-07-02: 6 mg via ORAL
  Filled 2014-07-02: qty 1

## 2014-07-02 NOTE — Progress Notes (Signed)
Physical Therapy Treatment Patient Details Name: Christopher [name] MRN: [id number] DOB: [birth year] Today's Date: 07/02/2014    History of Present Illness Patient is an 81 y/o male admitted s/p fall with right hip femoral neck fracture.  Awaiting surgery due to on anticoagulation.  PMH positive for HTN, DM, CABG 2008, SSS s/p PPM, BPH, CKD, h/o prostate CA, A-fib.    PT Comments    Patient very eager and motivated to participate in PT this afternoon. Showing some steady progress and ability to start ambulating. Will continue with current POC. Continue to recommend comprehensive inpatient rehab (CIR) for post-acute therapy needs.   Follow Up Recommendations  CIR;Supervision/Assistance - 24 hour     Equipment Recommendations  Rolling walker with 5" wheels    Recommendations for Other Services       Precautions / Restrictions Precautions Precautions: Fall Restrictions RLE Weight Bearing: Weight bearing as tolerated    Mobility  Bed Mobility Overal bed mobility: Needs Assistance Bed Mobility: Supine to Sit     Supine to sit: Mod assist;Max assist;HOB elevated     General bed mobility comments: assist to move feet off bed and lift to upright and scoot to edge of bed  Transfers Overall transfer level: Needs assistance Equipment used: Rolling walker (2 wheeled)   Sit to Stand: Mod assist         General transfer comment: Patient to stand without BUE support on RW. Able to stand from lower surface. Continued to require mod A for anterior weight shift and to power up into standing.   Ambulation/Gait Ambulation/Gait assistance: Min assist Ambulation Distance (Feet): 10 Feet Assistive device: Rolling walker (2 wheeled) Gait Pattern/deviations: Step-to pattern;Decreased stance time - right;Decreased step length - left;Narrow base of support Gait velocity: decreased   General Gait Details: Patient able to ambulate about 10 ft with heavy reliance on RW. Cues for RW management  and gait sequence. Chair to follow and promote distance   Stairs            Wheelchair Mobility    Modified Rankin (Stroke Patients Only)       Balance                                    Cognition Arousal/Alertness: Awake/alert Behavior During Therapy: WFL for tasks assessed/performed Overall Cognitive Status: Within Functional Limits for tasks assessed                      Exercises General Exercises - Lower Extremity Quad Sets: AROM;Right;10 reps Long Arc Quad: AAROM;Right;10 reps Heel Slides: AAROM;Right;10 reps Hip ABduction/ADduction: AAROM;Right;10 reps    General Comments        Pertinent Vitals/Pain Pain Score: 1  Pain Location: R hip Pain Descriptors / Indicators: Sore Pain Intervention(s): Monitored during session    Home Living                      Prior Function            PT Goals (current goals can now be found in the care plan section) Progress towards PT goals: Progressing toward goals    Frequency  Min 3X/week    PT Plan Current plan remains appropriate    Co-evaluation             End of Session Equipment Utilized During Treatment: Gait belt Activity Tolerance: Patient tolerated treatment  well Patient left: in chair;with call bell/phone within reach;with family/visitor present     Time: 5681-2751 PT Time Calculation (min) (ACUTE ONLY): 23 min  Charges:  $Gait Training: 8-22 mins $Therapeutic Exercise: 8-22 mins                    G Codes:      Jacqualyn Posey 07/02/2014, 2:07 PM 07/02/2014 Jacqualyn Posey PTA [phone] pager [phone] office

## 2014-07-02 NOTE — Progress Notes (Signed)
     Subjective:  POD#2 Right hip pinning. Patient reports pain as mild.  Resting comfortably in bed.  Objective:   VITALS:   Filed Vitals:   07/02/14 0344 07/02/14 0446 07/02/14 0447 07/02/14 0500  BP:   92/50 98/50  Pulse:   62   Temp:   98.1 F (36.7 C)   TempSrc:   Oral   Resp: 18     Height:      Weight:  111.086 kg (244 lb 14.4 oz)    SpO2: 97%  95%     Neurologically intact ABD soft Neurovascular intact Sensation intact distally Intact pulses distally Dorsiflexion/Plantar flexion intact Incision: dressing C/D/I   Lab Results  Component Value Date   WBC 11.4* 07/01/2014   HGB 9.3* 07/01/2014   HCT 30.1* 07/01/2014   MCV 92.0 07/01/2014   PLT 294 07/01/2014   BMET    Component Value Date/Time   NA 137 07/01/2014 0846   K 5.3* 07/01/2014 0846   CL 105 07/01/2014 0846   CO2 23 07/01/2014 0846   GLUCOSE 135* 07/01/2014 0846   BUN 62* 07/01/2014 0846   CREATININE 3.14* 07/01/2014 0846   CALCIUM 8.5 07/01/2014 0846   GFRNONAA 17* 07/01/2014 0846   GFRAA 19* 07/01/2014 0846     Assessment/Plan: 2 Days Post-Op   Principal Problem:   Closed right hip fracture Active Problems:   CAD (coronary artery disease)   Cardiac pacemaker in situ   Atrial fibrillation   Lumbar disc disease   Hyperlipidemia   Kidney disease, chronic, stage III (GFR 30-59 ml/min)   Long-term (current) use of anticoagulants   BPH (benign prostatic hyperplasia)   DM type 2 causing renal disease   CHF (congestive heart failure)   Hip fracture   Intertrochanteric fracture of left hip   Up with therapy WBAT in the RLE  Lovenox bridge to coumadin for DVT prophylaxis OK from Ortho standpoint to d/c once cleared by medicine   Christopher [name] Marie 07/02/2014, 7:36 AM Cell [phone]

## 2014-07-02 NOTE — Progress Notes (Signed)
ANTICOAGULATION CONSULT NOTE - Initial Consult  Pharmacy Consult for Coumadin Indication: atrial fibrillation  Allergies  Allergen Reactions  . Crestor [Rosuvastatin] Other (See Comments)    Aches in joint   . Lipitor [Atorvastatin] Other (See Comments)    Joint aching  . Penicillins Rash  . Tramadol Nausea Only  . Vicodin [Hydrocodone-Acetaminophen] Anxiety    "Made me nervous"    Patient Measurements: Height: 6\' 2"  (188 cm) Weight: 244 lb 14.4 oz (111.086 kg) IBW/kg (Calculated) : 82.2  Vital Signs: Temp: 98 F (36.7 C) (04/23 0930) Temp Source: Oral (04/23 0930) BP: 98/50 mmHg (04/23 0500) Pulse Rate: 67 (04/23 0930)  Labs:  Recent Labs  06/30/14 0750 07/01/14 0650 07/01/14 0846 07/02/14 0615  HGB 9.7*  --  9.3* 9.1*  HCT 31.5*  --  30.1* 29.0*  PLT 290  --  294 313  LABPROT 18.7* 16.8*  --  15.7*  INR 1.54* 1.35  --  1.24  CREATININE 2.98*  --  3.14* 3.63*    Estimated Creatinine Clearance: 19.7 mL/min (by C-G formula based on Cr of 3.63).   Medical History: Past Medical History  Diagnosis Date  . Lumbar disc disease   . BPH (benign prostatic hyperplasia)   . HTN (hypertension)   . Diabetes mellitus type II   . Sinus node dysfunction     symptomatic bradycardia  . Pacemaker     Medtronic-dual-chamber-DOI 2012  . CAD (coronary artery disease)     followed by Dr. Wynonia Lawman  . Arthritis   . History of skin cancer   . Frequency of urination   . Nocturia   . Chronic kidney disease     kidneys function at "20 %" followed by Dr. Mercy Moore  . Cancer     history of skin cancer  . Dysrhythmia     a fib  . Prostate cancer   . Ruptured disk 1992  . Skin cancer     Assessment: 58 YOM on coumadin PTA for afib, admitted for hip fracture, s/p pinning. Pharmacy is consulted to manage resuming coumadin. Pt. Is also on lovenox 30 mg for VTE px. INR 1.35 > 1.24, yesterday's dose wasn't charted, likely not given. Hgb 9.1, plt 313k stable.   PTA dose 4mg   daily.  Goal of Therapy:  INR 2-3 Monitor platelets by anticoagulation protocol: Yes   Plan:  - Coumadin 6 mg tonight, then 4mg  po daily from tomorrow. - F/u INR, D/c lovenox when INR > 2   Maryanna Shape, PharmD, BCPS  Clinical Pharmacist  Pager: [phone]   07/02/2014,10:39 AM

## 2014-07-02 NOTE — Progress Notes (Signed)
[facility] PROGRESS NOTE  Christopher [name] WEX:[id number] DOB: 21-Apr-1926 DOA: 06/28/2014 PCP: Tivis Ringer, MD  Assessment/Plan: #1 right hip fracture Secondary to mechanical fall. Patient states pain is controlled. Patient is status post cannulated hip pinning 06/30/2014 per Dr. Percell Miller. Continue current pain regimen. Coumadin has been resumed. Per orthopedics.  #2 lightheadedness Likely secondary to orthostasis secondary to diuretics. Clinical improvement. Resume lasix and monitor closely.  #3 acute on chronic kidney disease stage IV Baseline creatinine 3-3.5. Patient's creatinine on admission was 3.62 with a BUN of 75. Likely secondary to prerenal azotemia. Diuretics on hold. Renal function improved.  Creatinine is 3.63 from 3.14 today from 3.37. NSL IV fluids. Restart lasix at 20mg  daily. Outpatient follow up.  #4 leukocytosis Likely reactive leukocytosis. Patient is currently afebrile. Chest x-ray negative for any acute infiltrate. WBC trending down. Follow.  #5 atrial fibrillation Chads2Vasc score is 5. Patient on Coumadin at home. INR on admission was 1.66. Continue amiodarone and atenolol for rate control. Coumadin restated.   #6 BPH Stable. Continue Proscar.  #7 diastolic heart failure 2-D echo from 07/24/2013 with a EF of 55-60%. Patient seems compensated. No signs of volume overload. Resume diuretics at 20 mg daily. Follow blood pressure. Follow.  #8 well controlled diabetes mellitus Hemoglobin A1c = 6.1. CBGs 145-150. Continue sliding scale insulin.  #9 hyperlipidemia Fasting lipid with cholesterol 216 triglycerides of 186 LDL of 142 and HDL of 39. Patient with intolerances to statins and as such is not on a statin. Continue Lovaza.  #10 prophylaxis Lovenox for DVT prophylaxis, until INR therapeutic..  Code Status: Full Family Communication: Updated patient and wife at bedside. Disposition Plan: CIR vs skilled nursing  facility.   Consultants:  Orthopedics: Dr. Edmonia Lynch 06/28/2014  Procedures:  CT head 06/28/2014  Chest x-ray 06/28/2014  Cannulated hip pinning 06/30/2014 Dr. Edmonia Lynch  Antibiotics:  None  HPI/Subjective: Patient denies CP. No SOB. Pain controlled.  Objective: Filed Vitals:   07/02/14 0930  BP:   Pulse: 67  Temp: 98 F (36.7 C)  Resp: 18    Intake/Output Summary (Last 24 hours) at 07/02/14 0946 Last data filed at 07/02/14 0930  Gross per 24 hour  Intake    120 ml  Output    550 ml  Net   -430 ml   Filed Weights   06/29/14 0500 07/01/14 0408 07/02/14 0446  Weight: 102.604 kg (226 lb 3.2 oz) 111.54 kg (245 lb 14.4 oz) 111.086 kg (244 lb 14.4 oz)    Exam:   General:  NAD  Cardiovascular: RRR  Respiratory: CTAB anterior lung fields  Abdomen: Soft/NT/ND/+BS  Musculoskeletal: No c/c/e.   Data Reviewed: Basic Metabolic Panel:  Recent Labs Lab 06/28/14 1655 06/29/14 0550 06/30/14 0750 07/01/14 0846 07/02/14 0615  NA 140 140 138 137 140  K 4.3 4.7 4.9 5.3* 5.2*  CL 105 106 106 105 108  CO2 24 25 22 23 20   GLUCOSE 98 132* 124* 135* 134*  BUN 75* 70* 64* 62* 76*  CREATININE 3.62* 3.37* 2.98* 3.14* 3.63*  CALCIUM 8.9 8.8 8.6 8.5 8.9   Liver Function Tests: No results for input(s): AST, ALT, ALKPHOS, BILITOT, PROT, ALBUMIN in the last 168 hours. No results for input(s): LIPASE, AMYLASE in the last 168 hours. No results for input(s): AMMONIA in the last 168 hours. CBC:  Recent Labs Lab 06/28/14 1655 06/29/14 0550 06/30/14 0750 07/01/14 0846 07/02/14 0615  WBC 14.5* 14.0* 11.0* 11.4* 11.2*  NEUTROABS 12.6*  --   --  9.8*  --   HGB 10.8* 10.3* 9.7* 9.3* 9.1*  HCT 34.2* 32.9* 31.5* 30.1* 29.0*  MCV 90.2 91.6 91.8 92.0 92.4  PLT 382 334 290 294 313   Cardiac Enzymes: No results for input(s): CKTOTAL, CKMB, CKMBINDEX, TROPONINI in the last 168 hours. BNP (last 3 results)  Recent Labs  06/29/14 0550  BNP 628.4*     ProBNP (last 3 results)  Recent Labs  07/24/13 0120  PROBNP 9261.0*    CBG:  Recent Labs Lab 07/01/14 0629 07/01/14 1118 07/01/14 1607 07/01/14 2115 07/02/14 0644  GLUCAP 136* 160* 172* 150* 145*    Recent Results (from the past 240 hour(s))  Culture, Urine     Status: None   Collection Time: 06/29/14  9:47 PM  Result Value Ref Range Status   Specimen Description URINE, RANDOM  Final   Special Requests NONE  Final   Colony Count   Final    2,000 COLONIES/ML Performed at [facility]    Culture   Final    INSIGNIFICANT GROWTH Performed at [facility]    Report Status 07/01/2014 FINAL  Final  Surgical pcr screen     Status: None   Collection Time: 06/30/14  7:51 AM  Result Value Ref Range Status   MRSA, PCR NEGATIVE NEGATIVE Final   Staphylococcus aureus NEGATIVE NEGATIVE Final    Comment:        The Xpert SA Assay (FDA approved for NASAL specimens in patients over 54 years of age), is one component of a comprehensive surveillance program.  Test performance has been validated by [facility] for patients greater than or equal to 38 year old. It is not intended to diagnose infection nor to guide or monitor treatment.      Studies: Pelvis Portable  06/30/2014   CLINICAL DATA:  Status post ORIF of the fracture of the right femoral neck  EXAM: PORTABLE PELVIS 1-2 VIEWS  COMPARISON:  Preoperative exam of June 28, 2014  FINDINGS: The patient has undergone placement of 3 screws for fixation of the right femoral neck fracture. Alignment of the fracture is near anatomic  IMPRESSION: ORIF for right femoral neck fracture without evidence of immediate postprocedure complication.   Electronically Signed   By: David  Martinique M.D.   On: 06/30/2014 13:26   Dg Hip Operative Unilat With Pelvis Right  06/30/2014   CLINICAL DATA:  Femoral neck fracture.  EXAM: OPERATIVE RIGHT HIP (WITH PELVIS IF PERFORMED)  VIEWS  TECHNIQUE: Fluoroscopic spot image(s) were  submitted for interpretation post-operatively.  FLUOROSCOPY TIME:  Fluoroscopy Time:  0 minutes 45 seconds  Number of Acquired Images:  2 images  COMPARISON:  None.  FINDINGS: Patient status post right femoral neck pinning. Good anatomic alignment. Hardware intact.  IMPRESSION: ORIF right hip.   Electronically Signed   By: Marcello Moores  Register   On: 06/30/2014 13:05    Scheduled Meds: . amiodarone  200 mg Oral q morning - 10a  . atenolol  25 mg Oral q morning - 10a  . bacitracin   Topical BID  . docusate sodium  100 mg Oral BID  . enoxaparin (LOVENOX) injection  30 mg Subcutaneous Q24H  . finasteride  5 mg Oral Daily  . folic acid  824 mcg Oral Daily  . insulin aspart  0-9 Units Subcutaneous TID WC  . multivitamin with minerals  1 tablet Oral Daily  . omega-3 acid ethyl esters  1 g Oral Daily  . sodium chloride  3  mL Intravenous Q12H  . warfarin  4 mg Oral QPM  . Warfarin - Pharmacist Dosing Inpatient   Does not apply q1800   Continuous Infusions:    Principal Problem:   Closed right hip fracture Active Problems:   CAD (coronary artery disease)   Cardiac pacemaker in situ   Atrial fibrillation   Lumbar disc disease   Hyperlipidemia   Kidney disease, chronic, stage III (GFR 30-59 ml/min)   Long-term (current) use of anticoagulants   BPH (benign prostatic hyperplasia)   DM type 2 causing renal disease   CHF (congestive heart failure)   Hip fracture   Intertrochanteric fracture of left hip    Time spent: 70 minutes    Maryon Kemnitz M.D. [facility] Pager [phone]. If 7PM-7AM, please contact night-coverage at [email or web address], password [facility] 07/02/2014, 9:46 AM  LOS: 4 days

## 2014-07-03 DIAGNOSIS — S72001A Fracture of unspecified part of neck of right femur, initial encounter for closed fracture: Secondary | ICD-10-CM | POA: Diagnosis present

## 2014-07-03 DIAGNOSIS — Z7901 Long term (current) use of anticoagulants: Secondary | ICD-10-CM

## 2014-07-03 LAB — BASIC METABOLIC PANEL
Anion gap: 10 (ref 5–15)
BUN: 86 mg/dL — ABNORMAL HIGH (ref 6–23)
CALCIUM: 8.7 mg/dL (ref 8.4–10.5)
CHLORIDE: 105 mmol/L (ref 96–112)
CO2: 23 mmol/L (ref 19–32)
Creatinine, Ser: 3.72 mg/dL — ABNORMAL HIGH (ref 0.50–1.35)
GFR calc non Af Amer: 14 mL/min — ABNORMAL LOW (ref >90)
GFR, EST AFRICAN AMERICAN: 16 mL/min — AB (ref >90)
Glucose, Bld: 156 mg/dL — ABNORMAL HIGH (ref 70–99)
POTASSIUM: 5.6 mmol/L — AB (ref 3.5–5.1)
SODIUM: 138 mmol/L (ref 135–145)

## 2014-07-03 LAB — PROTIME-INR
INR: 1.29 (ref 0.00–1.49)
PROTHROMBIN TIME: 16.2 s — AB (ref 11.6–15.2)

## 2014-07-03 LAB — CBC
HCT: 29.9 % — ABNORMAL LOW (ref 39.0–52.0)
HEMOGLOBIN: 9.3 g/dL — AB (ref 13.0–17.0)
MCH: 29 pg (ref 26.0–34.0)
MCHC: 31.1 g/dL (ref 30.0–36.0)
MCV: 93.1 fL (ref 78.0–100.0)
Platelets: 327 10*3/uL (ref 150–400)
RBC: 3.21 MIL/uL — ABNORMAL LOW (ref 4.22–5.81)
RDW: 16.9 % — ABNORMAL HIGH (ref 11.5–15.5)
WBC: 10.3 10*3/uL (ref 4.0–10.5)

## 2014-07-03 LAB — GLUCOSE, CAPILLARY
GLUCOSE-CAPILLARY: 170 mg/dL — AB (ref 70–99)
GLUCOSE-CAPILLARY: 158 mg/dL — AB (ref 70–99)
Glucose-Capillary: 166 mg/dL — ABNORMAL HIGH (ref 70–99)
Glucose-Capillary: 144 mg/dL — ABNORMAL HIGH (ref 70–99)

## 2014-07-03 MED ORDER — FUROSEMIDE 40 MG PO TABS
40.0000 mg | ORAL_TABLET | Freq: Every day | ORAL | Status: DC
  Administered 2014-07-03 – 2014-07-04 (×2): 40 mg via ORAL
  Filled 2014-07-03 (×2): qty 1

## 2014-07-03 NOTE — Clinical Social Work Note (Signed)
Clinical Social Work Assessment  Patient Details  Name: Christopher [name] MRN: [id number] Date of Birth: Jan 07, 1927  Date of referral:  07/03/14               Reason for consult:  Discharge Planning                Permission sought to share information with:  Family Supports, Facility Sport and exercise psychologist Permission granted to share information::  Yes, Verbal Permission Granted  Name::     Christopher [name] [phone]  Agency::  [facility] area   Relationship::  Spouse   Contact Information:  [name] [phone]  Housing/Transportation Living arrangements for the past 2 months:  [place] of Information:  Patient, Spouse Patient Interpreter Needed:  None Criminal Activity/Legal Involvement Pertinent to Current Situation/Hospitalization:  No - Comment as needed Significant Relationships:  Church, Other Family Members, Spouse, Friend Lives with:  Spouse Do you feel safe going back to the place where you live?  Yes Need for family participation in patient care:  Yes (Comment) (Share information with Wife )  Care giving concerns: Pt lives with the wife at home and she will not be able to assist Pt with needs.   Social Worker assessment / plan: CSW met with the Pt and wife at the bedside. Pt was accepting of CSW and CSW introduced self. CSW explained that SNF would be a back up and that information sent would be only to obtain a bed if CIR can not accept Pt. Pt voiced understanding .  Employment status:  Retired Forensic scientist:  Information systems manager, Programmer, applications PT Recommendations:  [place] / Referral to community resources:  [place]  Patient/Family's Response to care: Pt and wife voiced understanding and will only consider SNF if CIR turns down Pt admission.  Patient/Family's Understanding of and Emotional Response to Diagnosis, Current Treatment, and Prognosis:  Pt voiced understanding of injury and is agreeable to rehab.   Emotional Assessment Appearance:  Appears stated age, Well-Groomed Attitude/Demeanor/Rapport:    Affect (typically observed):  Accepting, Appropriate, Calm Orientation:  Oriented to Self, Oriented to Place, Oriented to  Time, Oriented to Situation Alcohol / Substance use:  Never Used Psych involvement (Current and /or in the community):  No (Comment)  Discharge Needs  Concerns to be addressed:  Discharge Planning Concerns Readmission within the last 30 days:  No Current discharge risk:  None Barriers to Discharge:  No Barriers Identified   [name] 07/03/2014, 12:04 PM

## 2014-07-03 NOTE — Progress Notes (Signed)
     Subjective:  POD#3 Right hip pinning.  Patient reports pain as mild.  Resting comfortably in bed.  Was up to the chair with PT yesterday.  Objective:   VITALS:   Filed Vitals:   07/02/14 0447 07/02/14 0500 07/02/14 0930 07/03/14 0500  BP: 92/50 98/50  99/58  Pulse: 62  67 64  Temp: 98.1 F (36.7 C)  98 F (36.7 C) 98.7 F (37.1 C)  TempSrc: Oral  Oral   Resp:   18 18  Height:      Weight:    110.4 kg (243 lb 6.2 oz)  SpO2: 95%  96% 99%    Neurologically intact ABD soft Neurovascular intact Sensation intact distally Intact pulses distally Dorsiflexion/Plantar flexion intact Incision: dressing C/D/I   Lab Results  Component Value Date   WBC 10.3 07/03/2014   HGB 9.3* 07/03/2014   HCT 29.9* 07/03/2014   MCV 93.1 07/03/2014   PLT 327 07/03/2014   BMET    Component Value Date/Time   NA 138 07/03/2014 0550   K 5.6* 07/03/2014 0550   CL 105 07/03/2014 0550   CO2 23 07/03/2014 0550   GLUCOSE 156* 07/03/2014 0550   BUN 86* 07/03/2014 0550   CREATININE 3.72* 07/03/2014 0550   CALCIUM 8.7 07/03/2014 0550   GFRNONAA 14* 07/03/2014 0550   GFRAA 16* 07/03/2014 0550     Assessment/Plan: 3 Days Post-Op   Principal Problem:   Closed right hip fracture Active Problems:   CAD (coronary artery disease)   Cardiac pacemaker in situ   Atrial fibrillation   Lumbar disc disease   Hyperlipidemia   Kidney disease, chronic, stage III (GFR 30-59 ml/min)   Long-term (current) use of anticoagulants   BPH (benign prostatic hyperplasia)   DM type 2 causing renal disease   CHF (congestive heart failure)   Hip fracture   Intertrochanteric fracture of left hip   Up with therapy WBAT in the RLE Lovenox/Coumadin for DVT prophylaxis OK from ortho standpoint to be d/c to rehab once cleared by medicine and bed is available   Enriqueta Augusta Marie 07/03/2014, 7:11 AM Cell [phone]

## 2014-07-03 NOTE — Progress Notes (Signed)
[facility] PROGRESS NOTE  Christopher [name] DOB: 03/31/1926 DOA: 06/28/2014 PCP: Tivis Ringer, MD  Assessment/Plan: #1 right hip fracture Secondary to mechanical fall. Patient states pain is controlled. Patient is status post cannulated hip pinning 06/30/2014 per Dr. Percell Miller. Continue current pain regimen. Coumadin has been resumed. Per orthopedics. Likely needs CIR versus SNF.  #2 lightheadedness Likely secondary to orthostasis secondary to diuretics. Clinical improvement. Resumed lasix and monitor closely.  #3 acute on chronic kidney disease stage IV Baseline creatinine 3-3.5. Patient's creatinine on admission was 3.62 with a BUN of 75. Likely secondary to prerenal azotemia. Diuretics were held renal function trended down. Diuretics have been resumed renal function slowly trending up monitor closely.  Creatinine is 3.72 from 3.63 from 3.14 today from 3.37. NSL IV fluids. Continue Lasix at 40 mg daily. Outpatient follow up.  #4 leukocytosis Likely reactive leukocytosis. Patient is currently afebrile. Chest x-ray negative for any acute infiltrate. WBC trending down. Follow.  #5 atrial fibrillation Chads2Vasc score is 5. Patient on Coumadin at home. INR on admission was 1.66. Continue amiodarone and atenolol for rate control. Coumadin.   #6 BPH Stable. Continue Proscar.  #7 diastolic heart failure 2-D echo from 07/24/2013 with a EF of 55-60%. Patient seems compensated. No signs of volume overload. Increase oral Lasix to 40 mg daily. Monitor renal function and blood pressure closely. Follow.  #8 well controlled diabetes mellitus Hemoglobin A1c = 6.1. CBGs 144-170. Continue sliding scale insulin.  #9 hyperlipidemia Fasting lipid with cholesterol 216 triglycerides of 186 LDL of 142 and HDL of 39. Patient with intolerances to statins and as such is not on a statin. Continue Lovaza.  #10 prophylaxis Lovenox for DVT prophylaxis, until INR therapeutic..  Code Status:  Full Family Communication: Updated patient, no family at bedside. Disposition Plan: CIR vs skilled nursing facility.   Consultants:  Orthopedics: Dr. Edmonia Lynch 06/28/2014  Procedures:  CT head 06/28/2014  Chest x-ray 06/28/2014  Cannulated hip pinning 06/30/2014 Dr. Edmonia Lynch  Antibiotics:  None  HPI/Subjective: Patient laying in bed. No CP. No SOB. No dizziness.  Objective: Filed Vitals:   07/03/14 0500  BP: 99/58  Pulse: 64  Temp: 98.7 F (37.1 C)  Resp: 18    Intake/Output Summary (Last 24 hours) at 07/03/14 1703 Last data filed at 07/03/14 1300  Gross per 24 hour  Intake    720 ml  Output      0 ml  Net    720 ml   Filed Weights   07/01/14 0408 07/02/14 0446 07/03/14 0500  Weight: 111.54 kg (245 lb 14.4 oz) 111.086 kg (244 lb 14.4 oz) 110.4 kg (243 lb 6.2 oz)    Exam:   General:  NAD  Cardiovascular: RRR  Respiratory: CTAB anterior lung fields  Abdomen: Soft/NT/ND/+BS  Musculoskeletal: No c/c/e.   Data Reviewed: Basic Metabolic Panel:  Recent Labs Lab 06/29/14 0550 06/30/14 0750 07/01/14 0846 07/02/14 0615 07/03/14 0550  NA 140 138 137 140 138  K 4.7 4.9 5.3* 5.2* 5.6*  CL 106 106 105 108 105  CO2 25 22 23 20 23   GLUCOSE 132* 124* 135* 134* 156*  BUN 70* 64* 62* 76* 86*  CREATININE 3.37* 2.98* 3.14* 3.63* 3.72*  CALCIUM 8.8 8.6 8.5 8.9 8.7   Liver Function Tests: No results for input(s): AST, ALT, ALKPHOS, BILITOT, PROT, ALBUMIN in the last 168 hours. No results for input(s): LIPASE, AMYLASE in the last 168 hours. No results for input(s): AMMONIA in the last 168 hours.  CBC:  Recent Labs Lab 06/28/14 1655 06/29/14 0550 06/30/14 0750 07/01/14 0846 07/02/14 0615 07/03/14 0550  WBC 14.5* 14.0* 11.0* 11.4* 11.2* 10.3  NEUTROABS 12.6*  --   --  9.8*  --   --   HGB 10.8* 10.3* 9.7* 9.3* 9.1* 9.3*  HCT 34.2* 32.9* 31.5* 30.1* 29.0* 29.9*  MCV 90.2 91.6 91.8 92.0 92.4 93.1  PLT 382 334 290 294 313 327   Cardiac  Enzymes: No results for input(s): CKTOTAL, CKMB, CKMBINDEX, TROPONINI in the last 168 hours. BNP (last 3 results)  Recent Labs  06/29/14 0550  BNP 628.4*    ProBNP (last 3 results)  Recent Labs  07/24/13 0120  PROBNP 9261.0*    CBG:  Recent Labs Lab 07/02/14 1202 07/02/14 1619 07/03/14 0658 07/03/14 1209 07/03/14 1621  GLUCAP 150* 171* 166* 170* 144*    Recent Results (from the past 240 hour(s))  Culture, Urine     Status: None   Collection Time: 06/29/14  9:47 PM  Result Value Ref Range Status   Specimen Description URINE, RANDOM  Final   Special Requests NONE  Final   Colony Count   Final    2,000 COLONIES/ML Performed at [facility]    Culture   Final    INSIGNIFICANT GROWTH Performed at [facility]    Report Status 07/01/2014 FINAL  Final  Surgical pcr screen     Status: None   Collection Time: 06/30/14  7:51 AM  Result Value Ref Range Status   MRSA, PCR NEGATIVE NEGATIVE Final   Staphylococcus aureus NEGATIVE NEGATIVE Final    Comment:        The Xpert SA Assay (FDA approved for NASAL specimens in patients over 59 years of age), is one component of a comprehensive surveillance program.  Test performance has been validated by [facility] for patients greater than or equal to 48 year old. It is not intended to diagnose infection nor to guide or monitor treatment.      Studies: No results found.  Scheduled Meds: . amiodarone  200 mg Oral q morning - 10a  . atenolol  25 mg Oral q morning - 10a  . bacitracin   Topical BID  . docusate sodium  100 mg Oral BID  . enoxaparin (LOVENOX) injection  30 mg Subcutaneous Q24H  . finasteride  5 mg Oral Daily  . folic acid  423 mcg Oral Daily  . furosemide  40 mg Oral Daily  . insulin aspart  0-9 Units Subcutaneous TID WC  . multivitamin with minerals  1 tablet Oral Daily  . omega-3 acid ethyl esters  1 g Oral Daily  . sodium chloride  3 mL Intravenous Q12H  . warfarin  4 mg Oral  QPM  . Warfarin - Pharmacist Dosing Inpatient   Does not apply q1800   Continuous Infusions:    Principal Problem:   Closed right hip fracture Active Problems:   CAD (coronary artery disease)   Cardiac pacemaker in situ   Atrial fibrillation   Lumbar disc disease   Hyperlipidemia   Kidney disease, chronic, stage III (GFR 30-59 ml/min)   Long-term (current) use of anticoagulants   BPH (benign prostatic hyperplasia)   DM type 2 causing renal disease   CHF (congestive heart failure)   Hip fracture   Intertrochanteric fracture of left hip    Time spent: 16 minutes    Mitsuru Dault M.D. [facility] Pager [phone]. If 7PM-7AM, please contact night-coverage at [email or web address], password  [email or web address] 07/03/2014, 5:03 PM  LOS: 5 days

## 2014-07-03 NOTE — Clinical Social Work Placement (Signed)
   CLINICAL SOCIAL WORK PLACEMENT  NOTE  Date:  07/03/2014  Patient Details  Name: Christopher [name] MRN: [id number] Date of Birth: 1926/03/23  Clinical Social Work is seeking post-discharge placement for this patient at the [place] (SNF only if unable to go to CIR ) level of care (*CSW will initial, date and re-position this form in  chart as items are completed):  Yes   Patient/family provided with [facility] Work Department's list of facilities offering this level of care within the geographic area requested by the patient (or if unable, by the patient's family).  Yes   Patient/family informed of their freedom to choose among providers that offer the needed level of care, that participate in Medicare, Medicaid or managed care program needed by the patient, have an available bed and are willing to accept the patient.  Yes   Patient/family informed of [facility]'s ownership interest in [facility] and [facility], as well as of the fact that they are under no obligation to receive care at these facilities.  PASRR submitted to EDS on 07/03/14     PASRR number received on 07/03/14     Existing PASRR number confirmed on       FL2 transmitted to all facilities in geographic area requested by pt/family on 07/03/14     FL2 transmitted to all facilities within larger geographic area on       Patient informed that his/her managed care company has contracts with or will negotiate with certain facilities, including the following:        No   Patient/family informed of bed offers received.  Patient chooses bed at       Physician recommends and patient chooses bed at      Patient to be transferred to   on  .  Patient to be transferred to facility by       Patient family notified on   of transfer.  Name of family member notified:        PHYSICIAN       Additional Comment:    _______________________________________________ [name] 07/03/2014, 2:43 PM

## 2014-07-03 NOTE — Progress Notes (Signed)
ANTICOAGULATION CONSULT NOTE - FOLLOW UP  Pharmacy Consult:  Coumadin Indication: atrial fibrillation  Allergies  Allergen Reactions  . Crestor [Rosuvastatin] Other (See Comments)    Aches in joint   . Lipitor [Atorvastatin] Other (See Comments)    Joint aching  . Penicillins Rash  . Tramadol Nausea Only  . Vicodin [Hydrocodone-Acetaminophen] Anxiety    "Made me nervous"    Patient Measurements: Height: 6\' 2"  (188 cm) Weight: 243 lb 6.2 oz (110.4 kg) IBW/kg (Calculated) : 82.2  Vital Signs: Temp: 98.7 F (37.1 C) (04/24 0500) BP: 99/58 mmHg (04/24 0500) Pulse Rate: 64 (04/24 0500)  Labs:  Recent Labs  07/01/14 0650  07/01/14 0846 07/02/14 0615 07/03/14 0550  HGB  --   < > 9.3* 9.1* 9.3*  HCT  --   --  30.1* 29.0* 29.9*  PLT  --   --  294 313 327  LABPROT 16.8*  --   --  15.7* 16.2*  INR 1.35  --   --  1.24 1.29  CREATININE  --   --  3.14* 3.63* 3.72*  < > = values in this interval not displayed.  Estimated Creatinine Clearance: 19.2 mL/min (by C-G formula based on Cr of 3.72).      Assessment: 72 YOM on Coumadin PTA for Afib presented with hip fracture, now s/p pinning.  Pharmacy is consulted to manage Coumadin. He is also started on prophylactic Lovenox post-op.  INR trending up slowly; no bleeding reported.   Goal of Therapy:  INR 2-3    Plan:  - Coumadin 4mg  PO daily at 1800 - Continue Lovenox 30mg  SQ Q24H until INR therapeutic - Daily PT / INR    Briley Sulton D. Mina Marble, PharmD, BCPS Pager:  [phone] 07/03/2014, 10:23 AM

## 2014-07-04 ENCOUNTER — Inpatient Hospital Stay (HOSPITAL_COMMUNITY)
Admission: RE | Admit: 2014-07-04 | Discharge: 2014-07-13 | DRG: 560 | Disposition: A | Discharge status: Home Health | Payer: Medicare Other | Source: Intra-hospital | Attending: Physical Medicine & Rehabilitation | Admitting: Physical Medicine & Rehabilitation

## 2014-07-04 DIAGNOSIS — I480 Paroxysmal atrial fibrillation: Secondary | ICD-10-CM | POA: Diagnosis present

## 2014-07-04 DIAGNOSIS — D72829 Elevated white blood cell count, unspecified: Secondary | ICD-10-CM | POA: Diagnosis present

## 2014-07-04 DIAGNOSIS — K5900 Constipation, unspecified: Secondary | ICD-10-CM | POA: Diagnosis present

## 2014-07-04 DIAGNOSIS — S72001D Fracture of unspecified part of neck of right femur, subsequent encounter for closed fracture with routine healing: Principal | ICD-10-CM

## 2014-07-04 DIAGNOSIS — N183 Chronic kidney disease, stage 3 (moderate): Secondary | ICD-10-CM | POA: Diagnosis present

## 2014-07-04 DIAGNOSIS — E119 Type 2 diabetes mellitus without complications: Secondary | ICD-10-CM | POA: Diagnosis present

## 2014-07-04 DIAGNOSIS — I4891 Unspecified atrial fibrillation: Secondary | ICD-10-CM | POA: Diagnosis present

## 2014-07-04 DIAGNOSIS — I2510 Atherosclerotic heart disease of native coronary artery without angina pectoris: Secondary | ICD-10-CM | POA: Diagnosis present

## 2014-07-04 DIAGNOSIS — S72001A Fracture of unspecified part of neck of right femur, initial encounter for closed fracture: Secondary | ICD-10-CM | POA: Diagnosis present

## 2014-07-04 DIAGNOSIS — I119 Hypertensive heart disease without heart failure: Secondary | ICD-10-CM | POA: Diagnosis not present

## 2014-07-04 DIAGNOSIS — I1310 Hypertensive heart and chronic kidney disease without heart failure, with stage 1 through stage 4 chronic kidney disease, or unspecified chronic kidney disease: Secondary | ICD-10-CM | POA: Diagnosis present

## 2014-07-04 DIAGNOSIS — S72001S Fracture of unspecified part of neck of right femur, sequela: Secondary | ICD-10-CM | POA: Diagnosis not present

## 2014-07-04 DIAGNOSIS — I482 Chronic atrial fibrillation: Secondary | ICD-10-CM | POA: Diagnosis not present

## 2014-07-04 DIAGNOSIS — D62 Acute posthemorrhagic anemia: Secondary | ICD-10-CM | POA: Diagnosis present

## 2014-07-04 DIAGNOSIS — N185 Chronic kidney disease, stage 5: Secondary | ICD-10-CM | POA: Diagnosis present

## 2014-07-04 DIAGNOSIS — M533 Sacrococcygeal disorders, not elsewhere classified: Secondary | ICD-10-CM | POA: Diagnosis not present

## 2014-07-04 LAB — BASIC METABOLIC PANEL
ANION GAP: 10 (ref 5–15)
BUN: 86 mg/dL — ABNORMAL HIGH (ref 6–23)
CHLORIDE: 105 mmol/L (ref 96–112)
CO2: 24 mmol/L (ref 19–32)
Calcium: 8.6 mg/dL (ref 8.4–10.5)
Creatinine, Ser: 3.72 mg/dL — ABNORMAL HIGH (ref 0.50–1.35)
GFR calc Af Amer: 16 mL/min — ABNORMAL LOW (ref >90)
GFR, EST NON AFRICAN AMERICAN: 14 mL/min — AB (ref >90)
Glucose, Bld: 149 mg/dL — ABNORMAL HIGH (ref 70–99)
Potassium: 5.2 mmol/L — ABNORMAL HIGH (ref 3.5–5.1)
SODIUM: 139 mmol/L (ref 135–145)

## 2014-07-04 LAB — CBC
HEMATOCRIT: 29.3 % — AB (ref 39.0–52.0)
HEMOGLOBIN: 9 g/dL — AB (ref 13.0–17.0)
MCH: 28.8 pg (ref 26.0–34.0)
MCHC: 30.7 g/dL (ref 30.0–36.0)
MCV: 93.9 fL (ref 78.0–100.0)
PLATELETS: 335 10*3/uL (ref 150–400)
RBC: 3.12 MIL/uL — AB (ref 4.22–5.81)
RDW: 17 % — AB (ref 11.5–15.5)
WBC: 9.9 10*3/uL (ref 4.0–10.5)

## 2014-07-04 LAB — PROTIME-INR
INR: 1.25 (ref 0.00–1.49)
Prothrombin Time: 15.9 seconds — ABNORMAL HIGH (ref 11.6–15.2)

## 2014-07-04 LAB — GLUCOSE, CAPILLARY
GLUCOSE-CAPILLARY: 133 mg/dL — AB (ref 70–99)
Glucose-Capillary: 147 mg/dL — ABNORMAL HIGH (ref 70–99)
Glucose-Capillary: 127 mg/dL — ABNORMAL HIGH (ref 70–99)
Glucose-Capillary: 166 mg/dL — ABNORMAL HIGH (ref 70–99)

## 2014-07-04 MED ORDER — OMEGA-3-ACID ETHYL ESTERS 1 G PO CAPS
1.0000 g | ORAL_CAPSULE | Freq: Every day | ORAL | Status: DC
  Administered 2014-07-05 – 2014-07-13 (×9): 1 g via ORAL
  Filled 2014-07-04 (×10): qty 1

## 2014-07-04 MED ORDER — BISACODYL 10 MG RE SUPP
10.0000 mg | Freq: Every day | RECTAL | Status: DC | PRN
  Administered 2014-07-04 – 2014-07-10 (×3): 10 mg via RECTAL
  Filled 2014-07-04 (×3): qty 1

## 2014-07-04 MED ORDER — ONDANSETRON HCL 4 MG PO TABS: 4.0000 mg | ORAL_TABLET | Freq: Four times a day (QID) | ORAL | Status: DC | PRN

## 2014-07-04 MED ORDER — WARFARIN SODIUM 6 MG PO TABS
6.0000 mg | ORAL_TABLET | ORAL | Status: AC
  Administered 2014-07-04: 6 mg via ORAL
  Filled 2014-07-04: qty 1

## 2014-07-04 MED ORDER — OXYCODONE HCL 5 MG PO TABS
5.0000 mg | ORAL_TABLET | ORAL | Status: DC | PRN
  Administered 2014-07-04 – 2014-07-08 (×13): 5 mg via ORAL
  Filled 2014-07-04 (×14): qty 1

## 2014-07-04 MED ORDER — ATENOLOL 25 MG PO TABS
25.0000 mg | ORAL_TABLET | Freq: Every day | ORAL | Status: DC
  Administered 2014-07-05 – 2014-07-13 (×9): 25 mg via ORAL
  Filled 2014-07-04 (×10): qty 1

## 2014-07-04 MED ORDER — FINASTERIDE 5 MG PO TABS
5.0000 mg | ORAL_TABLET | Freq: Every day | ORAL | Status: DC
  Administered 2014-07-05 – 2014-07-13 (×9): 5 mg via ORAL
  Filled 2014-07-04 (×10): qty 1

## 2014-07-04 MED ORDER — WARFARIN SODIUM 6 MG PO TABS: 6.0000 mg | ORAL_TABLET | Freq: Once | ORAL | Status: DC

## 2014-07-04 MED ORDER — ALUM & MAG HYDROXIDE-SIMETH 200-200-20 MG/5ML PO SUSP: 30.0000 mL | ORAL | Status: DC | PRN

## 2014-07-04 MED ORDER — ACETAMINOPHEN 325 MG PO TABS: 325.0000 mg | ORAL_TABLET | ORAL | Status: DC | PRN

## 2014-07-04 MED ORDER — FUROSEMIDE 40 MG PO TABS
40.0000 mg | ORAL_TABLET | Freq: Every day | ORAL | Status: DC
  Administered 2014-07-05 – 2014-07-13 (×9): 40 mg via ORAL
  Filled 2014-07-04 (×10): qty 1

## 2014-07-04 MED ORDER — ENOXAPARIN SODIUM 30 MG/0.3ML ~~LOC~~ SOLN
30.0000 mg | SUBCUTANEOUS | Status: DC
  Administered 2014-07-05 – 2014-07-08 (×4): 30 mg via SUBCUTANEOUS
  Filled 2014-07-04 (×5): qty 0.3

## 2014-07-04 MED ORDER — WARFARIN - PHARMACIST DOSING INPATIENT
Freq: Every day | Status: DC
  Administered 2014-07-09: 4
  Administered 2014-07-10 – 2014-07-12 (×2)

## 2014-07-04 MED ORDER — FLEET ENEMA 7-19 GM/118ML RE ENEM: 1.0000 | ENEMA | Freq: Once | RECTAL | Status: AC | PRN

## 2014-07-04 MED ORDER — GUAIFENESIN-DM 100-10 MG/5ML PO SYRP: 5.0000 mL | ORAL_SOLUTION | Freq: Four times a day (QID) | ORAL | Status: DC | PRN

## 2014-07-04 MED ORDER — METHOCARBAMOL 500 MG PO TABS
500.0000 mg | ORAL_TABLET | Freq: Four times a day (QID) | ORAL | Status: DC | PRN
  Administered 2014-07-07 – 2014-07-12 (×8): 500 mg via ORAL
  Filled 2014-07-04 (×10): qty 1

## 2014-07-04 MED ORDER — MENTHOL 3 MG MT LOZG
1.0000 | LOZENGE | OROMUCOSAL | Status: DC | PRN
  Filled 2014-07-04: qty 9

## 2014-07-04 MED ORDER — SENNOSIDES-DOCUSATE SODIUM 8.6-50 MG PO TABS
1.0000 | ORAL_TABLET | Freq: Every evening | ORAL | Status: DC | PRN
  Administered 2014-07-06: 1 via ORAL
  Filled 2014-07-04: qty 1

## 2014-07-04 MED ORDER — INSULIN ASPART 100 UNIT/ML ~~LOC~~ SOLN
0.0000 [IU] | Freq: Three times a day (TID) | SUBCUTANEOUS | Status: DC
  Administered 2014-07-05: 1 [IU] via SUBCUTANEOUS
  Administered 2014-07-05: 2 [IU] via SUBCUTANEOUS
  Administered 2014-07-09 – 2014-07-11 (×3): 1 [IU] via SUBCUTANEOUS

## 2014-07-04 MED ORDER — ADULT MULTIVITAMIN W/MINERALS CH
1.0000 | ORAL_TABLET | Freq: Every day | ORAL | Status: DC
  Administered 2014-07-05 – 2014-07-13 (×9): 1 via ORAL
  Filled 2014-07-04 (×10): qty 1

## 2014-07-04 MED ORDER — TRAZODONE HCL 50 MG PO TABS: 25.0000 mg | ORAL_TABLET | Freq: Every evening | ORAL | Status: DC | PRN

## 2014-07-04 MED ORDER — FOLIC ACID 0.5 MG HALF TAB
500.0000 ug | ORAL_TABLET | Freq: Every day | ORAL | Status: DC
  Administered 2014-07-05 – 2014-07-13 (×9): 0.5 mg via ORAL
  Filled 2014-07-04 (×10): qty 1

## 2014-07-04 MED ORDER — PHENOL 1.4 % MT LIQD
1.0000 | OROMUCOSAL | Status: DC | PRN
  Filled 2014-07-04: qty 177

## 2014-07-04 MED ORDER — AMIODARONE HCL 200 MG PO TABS
200.0000 mg | ORAL_TABLET | Freq: Every morning | ORAL | Status: DC
  Administered 2014-07-05 – 2014-07-13 (×9): 200 mg via ORAL
  Filled 2014-07-04 (×9): qty 1

## 2014-07-04 MED ORDER — ONDANSETRON HCL 4 MG/2ML IJ SOLN: 4.0000 mg | Freq: Four times a day (QID) | INTRAMUSCULAR | Status: DC | PRN

## 2014-07-04 MED ORDER — GLIMEPIRIDE 1 MG PO TABS
1.0000 mg | ORAL_TABLET | Freq: Every day | ORAL | Status: DC
  Administered 2014-07-05 – 2014-07-13 (×9): 1 mg via ORAL
  Filled 2014-07-04 (×10): qty 1

## 2014-07-04 MED ORDER — WARFARIN SODIUM 6 MG PO TABS
6.0000 mg | ORAL_TABLET | Freq: Once | ORAL | Status: DC
  Filled 2014-07-04: qty 1

## 2014-07-04 MED ORDER — BACITRACIN ZINC 500 UNIT/GM EX OINT
TOPICAL_OINTMENT | Freq: Two times a day (BID) | CUTANEOUS | Status: DC
  Administered 2014-07-04 – 2014-07-06 (×4): via TOPICAL
  Filled 2014-07-04: qty 28.35

## 2014-07-04 MED ORDER — POLYSACCHARIDE IRON COMPLEX 150 MG PO CAPS
150.0000 mg | ORAL_CAPSULE | Freq: Two times a day (BID) | ORAL | Status: DC
  Administered 2014-07-05 – 2014-07-12 (×16): 150 mg via ORAL
  Filled 2014-07-04 (×19): qty 1

## 2014-07-04 NOTE — Progress Notes (Signed)
Received pt. As a transfer from [facility].Pt. And his wife were oriented to unit routine and protocol.Safety plan was explained,fall prevention plan was explained and signed.Keep monitoring pt. And assessing his needs.

## 2014-07-04 NOTE — H&P (Signed)
Physical Medicine and Rehabilitation Admission H&P    Chief Complaint  Patient presents with  . Fall with right hip fracture    HPI: Christopher [name] is a 81 y.o. male with H/o HTN, DM type 2, CAD with CABG/PPM, CKD- baseline Cr- 3.62, A Fib-chronic Coumadin; who was admitted on 06/28/2014 after a fall. No LOC but he reported some lightheadedness when he stood from the sitting position with subsequent fall onto his right hip. Cranial CT scan was negative. X-rays of right hip showed a nondisplaced femoral neck fracture.   He underwent  cannulated hip pinning 06/30/2014 per Dr. Edmonia Lynch the same day and is WBAT RLE.  Fall likely due to orthostatic symptoms and diuretics were held briefly. Reactive leucocytosis is resolving with decrease in WBC - 9.9, ABLA on chronic anemia is being monitored with hgb - 9.0. He continues on Lovenox bridge to Coumadin as INR subtherapeutic at 1.25.  Therapy ongoing and CIR was recommended by MD and rehab team.    Review of Systems  HENT: Positive for hearing loss.   Eyes: Positive for double vision (wears prism glasses). Negative for blurred vision.  Respiratory: Positive for cough. Negative for sputum production, shortness of breath and wheezing.   Cardiovascular: Negative for chest pain and palpitations.  Gastrointestinal: Positive for constipation (No BM since admission). Negative for heartburn.  Genitourinary: Negative for urgency and frequency.  Musculoskeletal: Positive for myalgias and back pain (chronic/Dr. Nelva Bush).  Neurological: Negative for dizziness, tingling and headaches.  Psychiatric/Behavioral: The patient is not nervous/anxious and does not have insomnia.      Past Medical History  Diagnosis Date  . Lumbar disc disease   . BPH (benign prostatic hyperplasia)   . HTN (hypertension)   . Diabetes mellitus type II   . Sinus node dysfunction     symptomatic bradycardia  . Pacemaker     Medtronic-dual-chamber-DOI 2012  . CAD (coronary  artery disease)     followed by Dr. Wynonia Lawman  . Arthritis   . History of skin cancer   . Frequency of urination   . Nocturia   . Chronic kidney disease     kidneys function at "20 %" followed by Dr. Mercy Moore  . Cancer     history of skin cancer  . Dysrhythmia     a fib  . Prostate cancer   . Ruptured disk 1992  . Skin cancer     Past Surgical History  Procedure Laterality Date  . Coronary artery bypass graft  2003    x3  . Cardiac catheterization    . Lumbar laminectomy  11/01  . Cardioversion  08/08/2011    Procedure: CARDIOVERSION;  Surgeon: Jacolyn Reedy, MD;  Location: [place];  Service: Cardiovascular;  Laterality: N/A;  . Cardioversion N/A 07/16/2012    Procedure: CARDIOVERSION;  Surgeon: Jacolyn Reedy, MD;  Location: [place];  Service: Cardiovascular;  Laterality: N/A;  . Pacemaker insertion  2012  . Cataracts removed    . Cystoscopy with biopsy N/A 06/21/2013    Procedure: CYSTOSCOPY WITH BIOPSY;  Surgeon: Molli Hazard, MD;  Location: [facility];  Service: Urology;  Laterality: N/A;    TUR RESECTION OF POLYP BILATERAL RETROGRADE PYELOGRAM BLADDER BIOPSY    . Prostate biopsy N/A 06/21/2013    Procedure: BIOPSY TRANSRECTAL ULTRASONIC PROSTATE (TUBP);  Surgeon: Molli Hazard, MD;  Location: [facility];  Service: Urology;  Laterality: N/A;  PROSTATE NERVE BLOCK  . Appendectomy  1980  .  Hip pinning,cannulated Right 06/30/2014    Procedure: CANNULATED HIP PINNING;  Surgeon: Renette Butters, MD;  Location: [place];  Service: Orthopedics;  Laterality: Right;    Family History  Problem Relation Age of Onset  . Cancer Mother     breast  . Cancer Father     prostate, esophagus    Social History:  Married. Retired--used to work on cars.  Independent with cane PTA.   He reports that he quit smoking about in 1946.  His smoking use included Cigarettes. He smoked 0.75 packs per day. He has never used smokeless tobacco. He reports that he does not drink alcohol or  use illicit drugs.    Allergies  Allergen Reactions  . Crestor [Rosuvastatin] Other (See Comments)    Aches in joint   . Lipitor [Atorvastatin] Other (See Comments)    Joint aching  . Penicillins Rash  . Tramadol Nausea Only  . Vicodin [Hydrocodone-Acetaminophen] Anxiety    "Made me nervous"    Medications Prior to Admission  Medication Sig Dispense Refill  . amiodarone (PACERONE) 200 MG tablet Take 200 mg by mouth every morning.    Marland Kitchen atenolol (TENORMIN) 25 MG tablet Take 25 mg by mouth every morning.     . finasteride (PROSCAR) 5 MG tablet Take 5 mg by mouth daily.      . folic acid (FOLVITE) 093 MCG tablet Take 400 mcg by mouth daily.      . furosemide (LASIX) 40 MG tablet Take 1.5 tablets (60 mg total) by mouth 2 (two) times daily. (Patient taking differently: Take 40 mg by mouth 2 (two) times daily. ) 90 tablet 1  . glimepiride (AMARYL) 1 MG tablet Take 1 mg by mouth daily before breakfast.      . Glucosamine HCl 1500 MG TABS Take 1,500 mg by mouth 2 (two) times daily.     . Horse Chestnut 300 MG CAPS Take 1 capsule by mouth daily.     . Multiple Vitamin (MULTIVITAMIN WITH MINERALS) TABS tablet Take 1 tablet by mouth daily.    . Omega-3 Fatty Acids (FISH OIL) 1200 MG CAPS Take 1,200 mg by mouth daily.    Marland Kitchen warfarin (COUMADIN) 4 MG tablet Take 4 mg by mouth every evening.    . hyoscyamine (LEVSIN, ANASPAZ) 0.125 MG tablet Take 1 tablet (0.125 mg total) by mouth every 4 (four) hours as needed (bladder spasms). 40 tablet 4    Home: Home Living Family/patient expects to be discharged to:: Private residence Living Arrangements: Spouse/significant other Available Help at Discharge: Family, Available 24 hours/day Type of Home: House Home Access: Stairs to enter Technical brewer of Steps: 2 Entrance Stairs-Rails: None Home Layout: One level Home Equipment: Cane - single point, Shower seat Additional Comments: tub shower   Functional History: Prior Function Level of  Independence: Independent with assistive device(s)  Functional Status:  Mobility: Bed Mobility Overal bed mobility: Needs Assistance Bed Mobility: Supine to Sit Supine to sit: HOB elevated, Min assist Sit to supine: +2 for physical assistance, Mod assist General bed mobility comments: Min A for RLE to EOB. Use of rails to position trunk upright and scoot to EOB. Transfers Overall transfer level: Needs assistance Equipment used: Rolling walker (2 wheeled) Transfers: Sit to/from Stand Sit to Stand: Mod assist, +2 physical assistance Stand pivot transfers: Mod assist General transfer comment: Pt able to stand on second attempt with bed elevated. Mod A for anterior weight shift and to power up into standing. Ambulation/Gait Ambulation/Gait assistance:  Min assist, +2 safety/equipment Ambulation Distance (Feet): 5 Feet Assistive device: Rolling walker (2 wheeled) Gait Pattern/deviations: Step-to pattern, Decreased stance time - right, Antalgic, Trunk flexed Gait velocity: decreased Gait velocity interpretation: Below normal speed for age/gender General Gait Details: Pt continues to rely heavily on RW and c/o UE fatigue during ambulation. Cues for gait sequence, hand placement on RW, and upright posture. Pt appeared to be more fatigued and unable to ambulate as far this session.    ADL: ADL Overall ADL's : Needs assistance/impaired General ADL Comments: Patient unable to tolerate this OT ADL session secondary to nausea and fatigue. Patient found supine in bed with onset of nausea during OT eval. WIll continue to follow from OT standpoint and from speaking with PT and reading PT notes, currently recommending CIR for rehab prior to d/c>home.   Cognition: Cognition Overall Cognitive Status: Within Functional Limits for tasks assessed Orientation Level: Oriented X4 Cognition Arousal/Alertness: Awake/alert Behavior During Therapy: WFL for tasks assessed/performed Overall Cognitive Status:  Within Functional Limits for tasks assessed  Physical Exam: Blood pressure 93/52, pulse 57, temperature 97.6 F (36.4 C), temperature source Oral, resp. rate 16, height 6' 2" (1.88 m), weight 112.7 kg (248 lb 7.3 oz), SpO2 96 %. Physical Exam  Nursing note and vitals reviewed. Constitutional: He is oriented to person, place, and time. He appears well-developed and well-nourished.  HENT:  Head: Normocephalic and atraumatic.  Right Ear: External ear normal.  Left Ear: External ear normal.  Eyes: Conjunctivae are normal. Pupils are equal, round, and reactive to light.  Neck: Normal range of motion. Neck supple.  Cardiovascular: Normal rate and regular rhythm.  Exam reveals no gallop and no friction rub.   No murmur heard. Respiratory: Effort normal and breath sounds normal. No respiratory distress. He has no wheezes. He has no rales.  GI: Soft. He exhibits distension. Bowel sounds are decreased. There is no tenderness.  Musculoskeletal:  Min edema right hip and thigh. Small incisions which are intact, covered with foam dressing.  Superficial area of avulsed skin on right lateral forearm.  Right thigh tender with PROM  Neurological: He is alert and oriented to person, place, and time. No cranial nerve deficit. Coordination normal.  Right Hip and knee pain with lower extremity range of motion no pain with ankle range of motion No pain with left lower extremity range of motion Upper extremity strength 5/5 bilateral deltoids, biceps, triceps, grip Left lower extremity 4/5 and hip flexor and extensor ankle dorsal flexion plantar flexion Trace right hip flexion knee extension 4 minus ankle dorsiflexion plantarflexion   Skin: Skin is warm and dry.  Psychiatric: He has a normal mood and affect. His behavior is normal.    Results for orders placed or performed during the hospital encounter of 06/28/14 (from the past 48 hour(s))  Glucose, capillary     Status: Abnormal   Collection Time: 07/02/14  12:02 PM  Result Value Ref Range   Glucose-Capillary 150 (H) 70 - 99 mg/dL  Glucose, capillary     Status: Abnormal   Collection Time: 07/02/14  4:19 PM  Result Value Ref Range   Glucose-Capillary 171 (H) 70 - 99 mg/dL  Protime-INR     Status: Abnormal   Collection Time: 07/03/14  5:50 AM  Result Value Ref Range   Prothrombin Time 16.2 (H) 11.6 - 15.2 seconds   INR 1.29 0.00 - 1.49  CBC     Status: Abnormal   Collection Time: 07/03/14  5:50 AM  Result  Value Ref Range   WBC 10.3 4.0 - 10.5 K/uL   RBC 3.21 (L) 4.22 - 5.81 MIL/uL   Hemoglobin 9.3 (L) 13.0 - 17.0 g/dL   HCT 29.9 (L) 39.0 - 52.0 %   MCV 93.1 78.0 - 100.0 fL   MCH 29.0 26.0 - 34.0 pg   MCHC 31.1 30.0 - 36.0 g/dL   RDW 16.9 (H) 11.5 - 15.5 %   Platelets 327 150 - 400 K/uL  Basic metabolic panel     Status: Abnormal   Collection Time: 07/03/14  5:50 AM  Result Value Ref Range   Sodium 138 135 - 145 mmol/L   Potassium 5.6 (H) 3.5 - 5.1 mmol/L   Chloride 105 96 - 112 mmol/L   CO2 23 19 - 32 mmol/L   Glucose, Bld 156 (H) 70 - 99 mg/dL   BUN 86 (H) 6 - 23 mg/dL   Creatinine, Ser 3.72 (H) 0.50 - 1.35 mg/dL   Calcium 8.7 8.4 - 10.5 mg/dL   GFR calc non Af Amer 14 (L) >90 mL/min   GFR calc Af Amer 16 (L) >90 mL/min    Comment: (NOTE) The eGFR has been calculated using the CKD EPI equation. This calculation has not been validated in all clinical situations. eGFR's persistently <90 mL/min signify possible Chronic Kidney Disease.    Anion gap 10 5 - 15  Glucose, capillary     Status: Abnormal   Collection Time: 07/03/14  6:58 AM  Result Value Ref Range   Glucose-Capillary 166 (H) 70 - 99 mg/dL  Glucose, capillary     Status: Abnormal   Collection Time: 07/03/14 12:09 PM  Result Value Ref Range   Glucose-Capillary 170 (H) 70 - 99 mg/dL  Glucose, capillary     Status: Abnormal   Collection Time: 07/03/14  4:21 PM  Result Value Ref Range   Glucose-Capillary 144 (H) 70 - 99 mg/dL  Glucose, capillary     Status:  Abnormal   Collection Time: 07/03/14  8:40 PM  Result Value Ref Range   Glucose-Capillary 158 (H) 70 - 99 mg/dL  Protime-INR     Status: Abnormal   Collection Time: 07/04/14  4:41 AM  Result Value Ref Range   Prothrombin Time 15.9 (H) 11.6 - 15.2 seconds   INR 1.25 0.00 - 1.49  CBC     Status: Abnormal   Collection Time: 07/04/14  4:41 AM  Result Value Ref Range   WBC 9.9 4.0 - 10.5 K/uL   RBC 3.12 (L) 4.22 - 5.81 MIL/uL   Hemoglobin 9.0 (L) 13.0 - 17.0 g/dL   HCT 29.3 (L) 39.0 - 52.0 %   MCV 93.9 78.0 - 100.0 fL   MCH 28.8 26.0 - 34.0 pg   MCHC 30.7 30.0 - 36.0 g/dL   RDW 17.0 (H) 11.5 - 15.5 %   Platelets 335 150 - 400 K/uL  Basic metabolic panel     Status: Abnormal   Collection Time: 07/04/14  4:41 AM  Result Value Ref Range   Sodium 139 135 - 145 mmol/L   Potassium 5.2 (H) 3.5 - 5.1 mmol/L   Chloride 105 96 - 112 mmol/L   CO2 24 19 - 32 mmol/L   Glucose, Bld 149 (H) 70 - 99 mg/dL   BUN 86 (H) 6 - 23 mg/dL   Creatinine, Ser 3.72 (H) 0.50 - 1.35 mg/dL   Calcium 8.6 8.4 - 10.5 mg/dL   GFR calc non Af Amer 14 (L) >90 mL/min  GFR calc Af Amer 16 (L) >90 mL/min    Comment: (NOTE) The eGFR has been calculated using the CKD EPI equation. This calculation has not been validated in all clinical situations. eGFR's persistently <90 mL/min signify possible Chronic Kidney Disease.    Anion gap 10 5 - 15  Glucose, capillary     Status: Abnormal   Collection Time: 07/04/14  6:59 AM  Result Value Ref Range   Glucose-Capillary 147 (H) 70 - 99 mg/dL   No results found.     Medical Problem List and Plan: 1. Functional deficits secondary to right femoral neck fracture after fall s/p pinning 2.  DVT Prophylaxis/Anticoagulation: Pharmaceutical: Lovenox till coumadin therapeutic 3. Pain Management: Simpifly pain regimen. Question SE to vicodin. Will use oxycodone prn. 4. Mood: Motivated to get better. LCSW to follow for evaluation and support.  5. Neuropsych: This patient is  capable of making decisions on his own behalf. 6. Skin/Wound Care: Routine pressure relief measures.  Foam dressing to right abrasion.  7. Fluids/Electrolytes/Nutrition: Monitor I/O. Encourage patient to push fluids.  8. DM type 2:  Reports po intake has been good. Will monitor BS with ac/hs checks. Resume amaryl and use SSI for elevated BS as needed.  9. A fib:  Will monitor HR every 8 hours. Continue amiodarone and atenolol for rate control.   10. Constipation:  Likely narcotic induced. Will add miralax bid for now. 11. HTN:  Monitor BP every 8 hours. Continue lasix 40 mg daily as well as porscar and tenormin. 12. Acute on chronic anemia: Will add iron supplement.    Post Admission Physician Evaluation: 1. Functional deficits secondary  to right femoral neck fracture. 2. Patient is admitted to receive collaborative, interdisciplinary care between the physiatrist, rehab nursing staff, and therapy team. 3. Patient's level of medical complexity and substantial therapy needs in context of that medical necessity cannot be provided at a lesser intensity of care such as a SNF. 4. Patient has experienced substantial functional loss from his/her baseline which was documented above under the "Functional History" and "Functional Status" headings.  Judging by the patient's diagnosis, physical exam, and functional history, the patient has potential for functional progress which will result in measurable gains while on inpatient rehab.  These gains will be of substantial and practical use upon discharge  in facilitating mobility and self-care at the household level. 5. Physiatrist will provide 24 hour management of medical needs as well as oversight of the therapy plan/treatment and provide guidance as appropriate regarding the interaction of the two. 6. 24 hour rehab nursing will assist with bladder management, bowel management, safety, skin/wound care, disease management, medication administration, pain  management and patient education  and help integrate therapy concepts, techniques,education, etc. 7. PT will assess and treat for/with: Lower extremity strength, range of motion, stamina, balance, functional mobility, safety, adaptive techniques and equipment, ortho precautions, pain mgt.   Goals are: mod I to supervision. 8. OT will assess and treat for/with: ADL's, functional mobility, safety, upper extremity strength, adaptive techniques and equipment, ortho precautions, pain mgt.   Goals are: mod I to supervision. Therapy may not yet proceed with showering this patient. 9. SLP will assess and treat for/with: n/a.  Goals are: n/a. 10. Case Management and Social Worker will assess and treat for psychological issues and discharge planning. 11. Team conference will be held weekly to assess progress toward goals and to determine barriers to discharge. 12. Patient will receive at least 3 hours of therapy per day  at least 5 days per week. 13. ELOS: 10-13 days       14. Prognosis:  excellent     [name], MD, [place] [facility] 07/04/2014   07/04/2014

## 2014-07-04 NOTE — H&P (View-Only) (Signed)
  Physical Medicine and Rehabilitation Admission H&P    Chief Complaint  Patient presents with  . Fall with right hip fracture    HPI: Christopher [name] is a 81 y.o. male with H/o HTN, DM type 2, CAD with CABG/PPM, CKD- baseline Cr- 3.62, A Fib-chronic Coumadin; who was admitted on 06/28/2014 after a fall. No LOC but he reported some lightheadedness when he stood from the sitting position with subsequent fall onto his right hip. Cranial CT scan was negative. X-rays of right hip showed a nondisplaced femoral neck fracture.   He underwent  cannulated hip pinning 06/30/2014 per Dr. Timothy Murphy the same day and is WBAT RLE.  Fall likely due to orthostatic symptoms and diuretics were held briefly. Reactive leucocytosis is resolving with decrease in WBC - 9.9, ABLA on chronic anemia is being monitored with hgb - 9.0. He continues on Lovenox bridge to Coumadin as INR subtherapeutic at 1.25.  Therapy ongoing and CIR was recommended by MD and rehab team.    Review of Systems  HENT: Positive for hearing loss.   Eyes: Positive for double vision (wears prism glasses). Negative for blurred vision.  Respiratory: Positive for cough. Negative for sputum production, shortness of breath and wheezing.   Cardiovascular: Negative for chest pain and palpitations.  Gastrointestinal: Positive for constipation (No BM since admission). Negative for heartburn.  Genitourinary: Negative for urgency and frequency.  Musculoskeletal: Positive for myalgias and back pain (chronic/Dr. Ramos).  Neurological: Negative for dizziness, tingling and headaches.  Psychiatric/Behavioral: The patient is not nervous/anxious and does not have insomnia.      Past Medical History  Diagnosis Date  . Lumbar disc disease   . BPH (benign prostatic hyperplasia)   . HTN (hypertension)   . Diabetes mellitus type II   . Sinus node dysfunction     symptomatic bradycardia  . Pacemaker     Medtronic-dual-chamber-DOI 2012  . CAD (coronary  artery disease)     followed by Dr. Tilley  . Arthritis   . History of skin cancer   . Frequency of urination   . Nocturia   . Chronic kidney disease     kidneys function at "20 %" followed by Dr. Mattingly  . Cancer     history of skin cancer  . Dysrhythmia     a fib  . Prostate cancer   . Ruptured disk 1992  . Skin cancer     Past Surgical History  Procedure Laterality Date  . Coronary artery bypass graft  2003    x3  . Cardiac catheterization    . Lumbar laminectomy  11/01  . Cardioversion  08/08/2011    Procedure: CARDIOVERSION;  Surgeon: William S Tilley, MD;  Location: [facility] OR;  Service: Cardiovascular;  Laterality: N/A;  . Cardioversion N/A 07/16/2012    Procedure: CARDIOVERSION;  Surgeon: William S Tilley, MD;  Location: [facility];  Service: Cardiovascular;  Laterality: N/A;  . Pacemaker insertion  2012  . Cataracts removed    . Cystoscopy with biopsy N/A 06/21/2013    Procedure: CYSTOSCOPY WITH BIOPSY;  Surgeon: Daniel Young Woodruff, MD;  Location: [facility]S;  Service: Urology;  Laterality: N/A;    TUR RESECTION OF POLYP BILATERAL RETROGRADE PYELOGRAM BLADDER BIOPSY    . Prostate biopsy N/A 06/21/2013    Procedure: BIOPSY TRANSRECTAL ULTRASONIC PROSTATE (TUBP);  Surgeon: Daniel Young Woodruff, MD;  Location: [facility];  Service: Urology;  Laterality: N/A;  PROSTATE NERVE BLOCK  . Appendectomy  1980  .   Hip pinning,cannulated Right 06/30/2014    Procedure: CANNULATED HIP PINNING;  Surgeon: Renette Butters, MD;  Location: [facility];  Service: Orthopedics;  Laterality: Right;    Family History  Problem Relation Age of Onset  . Cancer Mother     breast  . Cancer Father     prostate, esophagus    Social History:  Married. Retired--used to work on cars.  Independent with cane PTA.   He reports that he quit smoking about in 1946.  His smoking use included Cigarettes. He smoked 0.75 packs per day. He has never used smokeless tobacco. He reports that he does not drink alcohol or  use illicit drugs.    Allergies  Allergen Reactions  . Crestor [Rosuvastatin] Other (See Comments)    Aches in joint   . Lipitor [Atorvastatin] Other (See Comments)    Joint aching  . Penicillins Rash  . Tramadol Nausea Only  . Vicodin [Hydrocodone-Acetaminophen] Anxiety    "Made me nervous"    Medications Prior to Admission  Medication Sig Dispense Refill  . amiodarone (PACERONE) 200 MG tablet Take 200 mg by mouth every morning.    Marland Kitchen atenolol (TENORMIN) 25 MG tablet Take 25 mg by mouth every morning.     . finasteride (PROSCAR) 5 MG tablet Take 5 mg by mouth daily.      . folic acid (FOLVITE) 093 MCG tablet Take 400 mcg by mouth daily.      . furosemide (LASIX) 40 MG tablet Take 1.5 tablets (60 mg total) by mouth 2 (two) times daily. (Patient taking differently: Take 40 mg by mouth 2 (two) times daily. ) 90 tablet 1  . glimepiride (AMARYL) 1 MG tablet Take 1 mg by mouth daily before breakfast.      . Glucosamine HCl 1500 MG TABS Take 1,500 mg by mouth 2 (two) times daily.     . Horse Chestnut 300 MG CAPS Take 1 capsule by mouth daily.     . Multiple Vitamin (MULTIVITAMIN WITH MINERALS) TABS tablet Take 1 tablet by mouth daily.    . Omega-3 Fatty Acids (FISH OIL) 1200 MG CAPS Take 1,200 mg by mouth daily.    Marland Kitchen warfarin (COUMADIN) 4 MG tablet Take 4 mg by mouth every evening.    . hyoscyamine (LEVSIN, ANASPAZ) 0.125 MG tablet Take 1 tablet (0.125 mg total) by mouth every 4 (four) hours as needed (bladder spasms). 40 tablet 4    Home: Home Living Family/patient expects to be discharged to:: Private residence Living Arrangements: Spouse/significant other Available Help at Discharge: Family, Available 24 hours/day Type of Home: House Home Access: Stairs to enter Technical brewer of Steps: 2 Entrance Stairs-Rails: None Home Layout: One level Home Equipment: Cane - single point, Shower seat Additional Comments: tub shower   Functional History: Prior Function Level of  Independence: Independent with assistive device(s)  Functional Status:  Mobility: Bed Mobility Overal bed mobility: Needs Assistance Bed Mobility: Supine to Sit Supine to sit: HOB elevated, Min assist Sit to supine: +2 for physical assistance, Mod assist General bed mobility comments: Min A for RLE to EOB. Use of rails to position trunk upright and scoot to EOB. Transfers Overall transfer level: Needs assistance Equipment used: Rolling walker (2 wheeled) Transfers: Sit to/from Stand Sit to Stand: Mod assist, +2 physical assistance Stand pivot transfers: Mod assist General transfer comment: Pt able to stand on second attempt with bed elevated. Mod A for anterior weight shift and to power up into standing. Ambulation/Gait Ambulation/Gait assistance:  Min assist, +2 safety/equipment Ambulation Distance (Feet): 5 Feet Assistive device: Rolling walker (2 wheeled) Gait Pattern/deviations: Step-to pattern, Decreased stance time - right, Antalgic, Trunk flexed Gait velocity: decreased Gait velocity interpretation: Below normal speed for age/gender General Gait Details: Pt continues to rely heavily on RW and c/o UE fatigue during ambulation. Cues for gait sequence, hand placement on RW, and upright posture. Pt appeared to be more fatigued and unable to ambulate as far this session.    ADL: ADL Overall ADL's : Needs assistance/impaired General ADL Comments: Patient unable to tolerate this OT ADL session secondary to nausea and fatigue. Patient found supine in bed with onset of nausea during OT eval. WIll continue to follow from OT standpoint and from speaking with PT and reading PT notes, currently recommending CIR for rehab prior to d/c>home.   Cognition: Cognition Overall Cognitive Status: Within Functional Limits for tasks assessed Orientation Level: Oriented X4 Cognition Arousal/Alertness: Awake/alert Behavior During Therapy: WFL for tasks assessed/performed Overall Cognitive Status:  Within Functional Limits for tasks assessed  Physical Exam: Blood pressure 93/52, pulse 57, temperature 97.6 F (36.4 C), temperature source Oral, resp. rate 16, height 6' 2" (1.88 m), weight 112.7 kg (248 lb 7.3 oz), SpO2 96 %. Physical Exam  Nursing note and vitals reviewed. Constitutional: He is oriented to person, place, and time. He appears well-developed and well-nourished.  HENT:  Head: Normocephalic and atraumatic.  Right Ear: External ear normal.  Left Ear: External ear normal.  Eyes: Conjunctivae are normal. Pupils are equal, round, and reactive to light.  Neck: Normal range of motion. Neck supple.  Cardiovascular: Normal rate and regular rhythm.  Exam reveals no gallop and no friction rub.   No murmur heard. Respiratory: Effort normal and breath sounds normal. No respiratory distress. He has no wheezes. He has no rales.  GI: Soft. He exhibits distension. Bowel sounds are decreased. There is no tenderness.  Musculoskeletal:  Min edema right hip and thigh. Small incisions which are intact, covered with foam dressing.  Superficial area of avulsed skin on right lateral forearm.  Right thigh tender with PROM  Neurological: He is alert and oriented to person, place, and time. No cranial nerve deficit. Coordination normal.  Right Hip and knee pain with lower extremity range of motion no pain with ankle range of motion No pain with left lower extremity range of motion Upper extremity strength 5/5 bilateral deltoids, biceps, triceps, grip Left lower extremity 4/5 and hip flexor and extensor ankle dorsal flexion plantar flexion Trace right hip flexion knee extension 4 minus ankle dorsiflexion plantarflexion   Skin: Skin is warm and dry.  Psychiatric: He has a normal mood and affect. His behavior is normal.    Results for orders placed or performed during the hospital encounter of 06/28/14 (from the past 48 hour(s))  Glucose, capillary     Status: Abnormal   Collection Time: 07/02/14  12:02 PM  Result Value Ref Range   Glucose-Capillary 150 (H) 70 - 99 mg/dL  Glucose, capillary     Status: Abnormal   Collection Time: 07/02/14  4:19 PM  Result Value Ref Range   Glucose-Capillary 171 (H) 70 - 99 mg/dL  Protime-INR     Status: Abnormal   Collection Time: 07/03/14  5:50 AM  Result Value Ref Range   Prothrombin Time 16.2 (H) 11.6 - 15.2 seconds   INR 1.29 0.00 - 1.49  CBC     Status: Abnormal   Collection Time: 07/03/14  5:50 AM  Result  Value Ref Range   WBC 10.3 4.0 - 10.5 K/uL   RBC 3.21 (L) 4.22 - 5.81 MIL/uL   Hemoglobin 9.3 (L) 13.0 - 17.0 g/dL   HCT 29.9 (L) 39.0 - 52.0 %   MCV 93.1 78.0 - 100.0 fL   MCH 29.0 26.0 - 34.0 pg   MCHC 31.1 30.0 - 36.0 g/dL   RDW 16.9 (H) 11.5 - 15.5 %   Platelets 327 150 - 400 K/uL  Basic metabolic panel     Status: Abnormal   Collection Time: 07/03/14  5:50 AM  Result Value Ref Range   Sodium 138 135 - 145 mmol/L   Potassium 5.6 (H) 3.5 - 5.1 mmol/L   Chloride 105 96 - 112 mmol/L   CO2 23 19 - 32 mmol/L   Glucose, Bld 156 (H) 70 - 99 mg/dL   BUN 86 (H) 6 - 23 mg/dL   Creatinine, Ser 3.72 (H) 0.50 - 1.35 mg/dL   Calcium 8.7 8.4 - 10.5 mg/dL   GFR calc non Af Amer 14 (L) >90 mL/min   GFR calc Af Amer 16 (L) >90 mL/min    Comment: (NOTE) The eGFR has been calculated using the CKD EPI equation. This calculation has not been validated in all clinical situations. eGFR's persistently <90 mL/min signify possible Chronic Kidney Disease.    Anion gap 10 5 - 15  Glucose, capillary     Status: Abnormal   Collection Time: 07/03/14  6:58 AM  Result Value Ref Range   Glucose-Capillary 166 (H) 70 - 99 mg/dL  Glucose, capillary     Status: Abnormal   Collection Time: 07/03/14 12:09 PM  Result Value Ref Range   Glucose-Capillary 170 (H) 70 - 99 mg/dL  Glucose, capillary     Status: Abnormal   Collection Time: 07/03/14  4:21 PM  Result Value Ref Range   Glucose-Capillary 144 (H) 70 - 99 mg/dL  Glucose, capillary     Status:  Abnormal   Collection Time: 07/03/14  8:40 PM  Result Value Ref Range   Glucose-Capillary 158 (H) 70 - 99 mg/dL  Protime-INR     Status: Abnormal   Collection Time: 07/04/14  4:41 AM  Result Value Ref Range   Prothrombin Time 15.9 (H) 11.6 - 15.2 seconds   INR 1.25 0.00 - 1.49  CBC     Status: Abnormal   Collection Time: 07/04/14  4:41 AM  Result Value Ref Range   WBC 9.9 4.0 - 10.5 K/uL   RBC 3.12 (L) 4.22 - 5.81 MIL/uL   Hemoglobin 9.0 (L) 13.0 - 17.0 g/dL   HCT 29.3 (L) 39.0 - 52.0 %   MCV 93.9 78.0 - 100.0 fL   MCH 28.8 26.0 - 34.0 pg   MCHC 30.7 30.0 - 36.0 g/dL   RDW 17.0 (H) 11.5 - 15.5 %   Platelets 335 150 - 400 K/uL  Basic metabolic panel     Status: Abnormal   Collection Time: 07/04/14  4:41 AM  Result Value Ref Range   Sodium 139 135 - 145 mmol/L   Potassium 5.2 (H) 3.5 - 5.1 mmol/L   Chloride 105 96 - 112 mmol/L   CO2 24 19 - 32 mmol/L   Glucose, Bld 149 (H) 70 - 99 mg/dL   BUN 86 (H) 6 - 23 mg/dL   Creatinine, Ser 3.72 (H) 0.50 - 1.35 mg/dL   Calcium 8.6 8.4 - 10.5 mg/dL   GFR calc non Af Amer 14 (L) >90 mL/min     GFR calc Af Amer 16 (L) >90 mL/min    Comment: (NOTE) The eGFR has been calculated using the CKD EPI equation. This calculation has not been validated in all clinical situations. eGFR's persistently <90 mL/min signify possible Chronic Kidney Disease.    Anion gap 10 5 - 15  Glucose, capillary     Status: Abnormal   Collection Time: 07/04/14  6:59 AM  Result Value Ref Range   Glucose-Capillary 147 (H) 70 - 99 mg/dL   No results found.     Medical Problem List and Plan: 1. Functional deficits secondary to right femoral neck fracture after fall s/p pinning 2.  DVT Prophylaxis/Anticoagulation: Pharmaceutical: Lovenox till coumadin therapeutic 3. Pain Management: Simpifly pain regimen. Question SE to vicodin. Will use oxycodone prn. 4. Mood: Motivated to get better. LCSW to follow for evaluation and support.  5. Neuropsych: This patient is  capable of making decisions on his own behalf. 6. Skin/Wound Care: Routine pressure relief measures.  Foam dressing to right abrasion.  7. Fluids/Electrolytes/Nutrition: Monitor I/O. Encourage patient to push fluids.  8. DM type 2:  Reports po intake has been good. Will monitor BS with ac/hs checks. Resume amaryl and use SSI for elevated BS as needed.  9. A fib:  Will monitor HR every 8 hours. Continue amiodarone and atenolol for rate control.   10. Constipation:  Likely narcotic induced. Will add miralax bid for now. 11. HTN:  Monitor BP every 8 hours. Continue lasix 40 mg daily as well as porscar and tenormin. 12. Acute on chronic anemia: Will add iron supplement.    Post Admission Physician Evaluation: 1. Functional deficits secondary  to right femoral neck fracture. 2. Patient is admitted to receive collaborative, interdisciplinary care between the physiatrist, rehab nursing staff, and therapy team. 3. Patient's level of medical complexity and substantial therapy needs in context of that medical necessity cannot be provided at a lesser intensity of care such as a SNF. 4. Patient has experienced substantial functional loss from his/her baseline which was documented above under the "Functional History" and "Functional Status" headings.  Judging by the patient's diagnosis, physical exam, and functional history, the patient has potential for functional progress which will result in measurable gains while on inpatient rehab.  These gains will be of substantial and practical use upon discharge  in facilitating mobility and self-care at the household level. 5. Physiatrist will provide 24 hour management of medical needs as well as oversight of the therapy plan/treatment and provide guidance as appropriate regarding the interaction of the two. 6. 24 hour rehab nursing will assist with bladder management, bowel management, safety, skin/wound care, disease management, medication administration, pain  management and patient education  and help integrate therapy concepts, techniques,education, etc. 7. PT will assess and treat for/with: Lower extremity strength, range of motion, stamina, balance, functional mobility, safety, adaptive techniques and equipment, ortho precautions, pain mgt.   Goals are: mod I to supervision. 8. OT will assess and treat for/with: ADL's, functional mobility, safety, upper extremity strength, adaptive techniques and equipment, ortho precautions, pain mgt.   Goals are: mod I to supervision. Therapy may not yet proceed with showering this patient. 9. SLP will assess and treat for/with: n/a.  Goals are: n/a. 10. Case Management and Social Worker will assess and treat for psychological issues and discharge planning. 11. Team conference will be held weekly to assess progress toward goals and to determine barriers to discharge. 12. Patient will receive at least 3 hours of therapy per day  at least 5 days per week. 13. ELOS: 10-13 days       14. Prognosis:  excellent     Taunia Frasco T. Babetta Paterson, MD, FAAPMR [facility] 07/04/2014   07/04/2014 

## 2014-07-04 NOTE — Progress Notes (Signed)
Rehab admissions - I met with pt and his wife in follow up to rehab MD consult to explain the possibility of inpatient rehab and they are interested in pursuing this. Further questions were answered and informational brochures were given.   I spoke with Dr. Grandville Silos about pt's status and he gave medical clearance for pt to come to CIR. Bed is available and we will admit to inpatient rehab later today.  I updated pt's RN, Manuela Schwartz, case Freight forwarder and Barnett Applebaum, Education officer, museum.   Please call me with any questions. Thanks.  Nanetta Batty, PT Rehabilitation Admissions Coordinator [phone]

## 2014-07-04 NOTE — Progress Notes (Signed)
Physical Therapy Treatment Patient Details Name: Christopher [name] MRN: [id number] DOB: Mar 14, 1926 Today's Date: 07/04/2014    History of Present Illness Patient is an 81 y/o male admitted s/p fall with right hip femoral neck fracture.  Awaiting surgery due to on anticoagulation.  PMH positive for HTN, DM, CABG 2008, SSS s/p PPM, BPH, CKD, h/o prostate CA, A-fib.    PT Comments    Pt appeared to be more fatigued this AM, but still able to participate in functional activities and exercise. He would benefit from continued PT to increase functional independence and safety. Continue to recommend CIR for ongoing therapy.   Follow Up Recommendations  CIR;Supervision/Assistance - 24 hour     Equipment Recommendations  Rolling walker with 5" wheels    Recommendations for Other Services       Precautions / Restrictions Precautions Precautions: Fall Restrictions RLE Weight Bearing: Weight bearing as tolerated    Mobility  Bed Mobility Overal bed mobility: Needs Assistance Bed Mobility: Supine to Sit     Supine to sit: HOB elevated;Min assist     General bed mobility comments: Min A for RLE to EOB. Use of rails to position trunk upright and scoot to EOB.  Transfers Overall transfer level: Needs assistance Equipment used: Rolling walker (2 wheeled) Transfers: Sit to/from Stand Sit to Stand: Mod assist;+2 physical assistance         General transfer comment: Pt able to stand on second attempt with bed elevated. Mod A for anterior weight shift and to power up into standing.  Ambulation/Gait Ambulation/Gait assistance: Min assist;+2 safety/equipment Ambulation Distance (Feet): 5 Feet Assistive device: Rolling walker (2 wheeled) Gait Pattern/deviations: Step-to pattern;Decreased stance time - right;Antalgic;Trunk flexed   Gait velocity interpretation: Below normal speed for age/gender General Gait Details: Pt continues to rely heavily on RW and c/o UE fatigue during ambulation.  Cues for gait sequence, hand placement on RW, and upright posture. Pt appeared to be more fatigued and unable to ambulate as far this session.   Stairs            Wheelchair Mobility    Modified Rankin (Stroke Patients Only)       Balance                                    Cognition Arousal/Alertness: Awake/alert Behavior During Therapy: WFL for tasks assessed/performed Overall Cognitive Status: Within Functional Limits for tasks assessed                      Exercises General Exercises - Lower Extremity Quad Sets: AROM;Right;10 reps Long Arc Quad: AAROM;Right;5 reps Heel Slides: AAROM;Right;5 reps Hip ABduction/ADduction: AAROM;Right;5 reps    General Comments        Pertinent Vitals/Pain Pain Assessment: No/denies pain Pain Intervention(s): Monitored during session    Home Living                      Prior Function            PT Goals (current goals can now be found in the care plan section) Progress towards PT goals: Progressing toward goals    Frequency  Min 3X/week    PT Plan Current plan remains appropriate    Co-evaluation             End of Session Equipment Utilized During Treatment: Gait belt Activity Tolerance: Patient limited  by fatigue Patient left: in chair;with call bell/phone within reach;with family/visitor present     Time: 8329-1916 PT Time Calculation (min) (ACUTE ONLY): 22 min  Charges:                       G CodesRubye Oaks, SPTA 07/04/2014, 9:56 AM

## 2014-07-04 NOTE — PMR Pre-admission (Signed)
PMR Admission Coordinator Pre-Admission Assessment  Patient: Christopher [name] is an 81 y.o., male MRN: [id number] DOB: [birth year] Height: 6\' 2"  (188 cm) Weight: 112.7 kg (248 lb 7.3 oz)              Insurance Information  PRIMARY: Medicare A & B      Policy#: [id number]      Subscriber: self Pre-Cert#: verified in [employer]: retired Runner, broadcasting/film/video. Date: A & B: 11-09-93     Deduct: $1288      Out of Pocket Max: none      Life Max: unlimited CIR: 100%      SNF: 100% days 1-20; 80% days 21-100 (100 days max) Outpatient: 80%     Co-Pay: 20% Home Health: 100%      Co-Pay: none DME: 80%     Co-Pay: 20% Providers: pt's preference  SECONDARY: AARP Medicare Supplement      Policy#: [id number]      Subscriber: self Benefits:  Phone #: [phone]       Emergency Contact Information Contact Information    Name Relation Home Work Mobile   May Spouse [phone]  [phone]     Current Medical History  Patient Admitting Diagnosis: Right femoral neck fracture s/p pinning  History of Present Illness: Christopher [name] is a 81 y.o. right handed male with history of hypertension, diabetes mellitus and peripheral neuropathy, CAD with CABG/pacemaker, chronic kidney disease with baseline creatinine 3.62 and atrial fibrillation with chronic Coumadin therapy. Patient lives with his wife independent with a cane prior to admission. Admitted 06/28/2014 after a fall without loss of consciousness. He did note some lightheadedness when he stood from the sitting position following forward on his right hip. Cranial CT scan was negative. X-rays of right hip showed a nondisplaced femoral neck fracture. INR on admission of 1.66. Underwent cannulated hip pinning 06/30/2014 per Dr. Percell Miller. Weightbearing as tolerated right lower extremity. Chronic Coumadin has been resumed. Hospital course pain management. Acute blood loss anemia 9.3 and monitored. Physical therapy evaluation completed with recommendations of  physical medicine rehabilitation consult.  Past Medical History  Past Medical History  Diagnosis Date  . Lumbar disc disease   . BPH (benign prostatic hyperplasia)   . HTN (hypertension)   . Diabetes mellitus type II   . Sinus node dysfunction     symptomatic bradycardia  . Pacemaker     Medtronic-dual-chamber-DOI 2012  . CAD (coronary artery disease)     followed by Dr. Wynonia Lawman  . Arthritis   . History of skin cancer   . Frequency of urination   . Nocturia   . Chronic kidney disease     kidneys function at "20 %" followed by Dr. Mercy Moore  . Cancer     history of skin cancer  . Dysrhythmia     a fib  . Prostate cancer   . Ruptured disk 1992  . Skin cancer     Family History  family history includes Cancer in his father and mother.  Prior Rehab/Hospitalizations: pt had previous CABG and then had follow up cardiac rehab.   Current Medications   Current facility-administered medications:  .  acetaminophen (TYLENOL) tablet 650 mg, 650 mg, Oral, Q6H PRN **OR** acetaminophen (TYLENOL) suppository 650 mg, 650 mg, Rectal, Q6H PRN, Irine Seal V, MD .  amiodarone (PACERONE) tablet 200 mg, 200 mg, Oral, q morning - 10a, Ivor Costa, MD, 200 mg at 07/04/14 0841 .  atenolol (TENORMIN) tablet 25  mg, 25 mg, Oral, q morning - 10a, Ivor Costa, MD, 25 mg at 07/03/14 1001 .  bacitracin ointment, , Topical, BID, Tori Milks, MD .  docusate sodium (COLACE) capsule 100 mg, 100 mg, Oral, BID, Eugenie Filler, MD, 100 mg at 07/04/14 0840 .  enoxaparin (LOVENOX) injection 30 mg, 30 mg, Subcutaneous, Q24H, Eugenie Filler, MD, 30 mg at 07/04/14 0840 .  finasteride (PROSCAR) tablet 5 mg, 5 mg, Oral, Daily, Ivor Costa, MD, 5 mg at 07/04/14 0841 .  folic acid (FOLVITE) tablet 0.5 mg, 500 mcg, Oral, Daily, Ivor Costa, MD, 0.5 mg at 07/04/14 0842 .  furosemide (LASIX) tablet 40 mg, 40 mg, Oral, Daily, Irine Seal V, MD, 40 mg at 07/04/14 0840 .  HYDROcodone-acetaminophen (NORCO/VICODIN)  5-325 MG per tablet 1-2 tablet, 1-2 tablet, Oral, Q6H PRN, Eugenie Filler, MD, 2 tablet at 07/04/14 [phone] .  HYDROmorphone (DILAUDID) injection 1 mg, 1 mg, Intravenous, Q3H PRN, Gardiner Barefoot, NP, 1 mg at 07/04/14 1046 .  ibuprofen (ADVIL,MOTRIN) tablet 400 mg, 400 mg, Oral, Q6H PRN, Ivor Costa, MD .  insulin aspart (novoLOG) injection 0-9 Units, 0-9 Units, Subcutaneous, TID WC, Ivor Costa, MD, 1 Units at 07/04/14 [phone] .  menthol-cetylpyridinium (CEPACOL) lozenge 3 mg, 1 lozenge, Oral, PRN **OR** phenol (CHLORASEPTIC) mouth spray 1 spray, 1 spray, Mouth/Throat, PRN, Irine Seal V, MD .  morphine 2 MG/ML injection 0.5 mg, 0.5 mg, Intravenous, Q2H PRN, Eugenie Filler, MD .  multivitamin with minerals tablet 1 tablet, 1 tablet, Oral, Daily, Ivor Costa, MD, 1 tablet at 07/04/14 0840 .  omega-3 acid ethyl esters (LOVAZA) capsule 1 g, 1 g, Oral, Daily, Ivor Costa, MD, 1 g at 07/04/14 0841 .  ondansetron (ZOFRAN) tablet 4 mg, 4 mg, Oral, Q6H PRN **OR** ondansetron (ZOFRAN) injection 4 mg, 4 mg, Intravenous, Q6H PRN, Ivor Costa, MD, 4 mg at 07/02/14 2041 .  oxyCODONE (Oxy IR/ROXICODONE) immediate release tablet 5 mg, 5 mg, Oral, Q6H PRN, Gardiner Barefoot, NP, 5 mg at 07/03/14 2158 .  sodium chloride 0.9 % injection 3 mL, 3 mL, Intravenous, Q12H, Ivor Costa, MD, 3 mL at 07/03/14 2159 .  warfarin (COUMADIN) tablet 6 mg, 6 mg, Oral, ONCE-1800, Lavenia Atlas, RPH .  Warfarin - Pharmacist Dosing Inpatient, , Does not apply, q1800, Leodis Sias, RPH  Patients Current Diet: Diet Carb Modified Fluid consistency:: Thin; Room service appropriate?: Yes  Precautions / Restrictions Precautions Precautions: Fall Precaution Comments: watch BP Restrictions Weight Bearing Restrictions: Yes RLE Weight Bearing: Weight bearing as tolerated   Prior Activity Level Community (5-7x/wk): Pt got out everyday and he and his wife ran errands together.  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    Home Assistive Devices / Equipment Home Equipment: Cane - single point, Shower seat  Prior Functional Level Prior Function Level of Independence: Independent with assistive device(s)  Current Functional Level Cognition  Overall Cognitive Status: Within Functional Limits for tasks assessed Orientation Level: Oriented X4    Extremity Assessment (includes Sensation/Coordination)  Upper Extremity Assessment: Overall WFL for tasks assessed  Lower Extremity Assessment: Defer to PT evaluation RLE Deficits / Details: AAROM limited by pain, performed ankle pumps without difficulty RLE: Unable to fully assess due to pain    ADLs  Overall ADL's : Needs assistance/impaired General ADL Comments: Patient unable to tolerate this OT ADL session secondary to nausea and fatigue. Patient found  supine in bed with onset of nausea during OT eval. WIll continue to follow from OT standpoint and from speaking with PT and reading PT notes, currently recommending CIR for rehab prior to d/c>home.     Mobility  Overal bed mobility: Needs Assistance Bed Mobility: Supine to Sit Supine to sit: HOB elevated, Min assist Sit to supine: +2 for physical assistance, Mod assist General bed mobility comments: Min A for RLE to EOB. Use of rails to position trunk upright and scoot to EOB.    Transfers  Overall transfer level: Needs assistance Equipment used: Rolling walker (2 wheeled) Transfers: Sit to/from Stand Sit to Stand: Mod assist, +2 physical assistance Stand pivot transfers: Mod assist General transfer comment: Pt able to stand on second attempt with bed elevated. Mod A for anterior weight shift and to power up into standing.    Ambulation / Gait / Stairs / Wheelchair Mobility  Ambulation/Gait Ambulation/Gait assistance: Min assist, +2 safety/equipment Ambulation Distance (Feet): 5 Feet Assistive device: Rolling walker  (2 wheeled) Gait Pattern/deviations: Step-to pattern, Decreased stance time - right, Antalgic, Trunk flexed Gait velocity: decreased Gait velocity interpretation: Below normal speed for age/gender General Gait Details: Pt continues to rely heavily on RW and c/o UE fatigue during ambulation. Cues for gait sequence, hand placement on RW, and upright posture. Pt appeared to be more fatigued and unable to ambulate as far this session.    Posture / Balance Dynamic Sitting Balance Sitting balance - Comments: sat edge of bed to get BP x about 4 minutes Balance Overall balance assessment: Needs assistance Sitting balance-Leahy Scale: Good Sitting balance - Comments: sat edge of bed to get BP x about 4 minutes Standing balance support: Bilateral upper extremity supported Standing balance-Leahy Scale: Poor Standing balance comment: standing with UE support, needed at least min assist for safety due to fatigue and sinking a little with postural weakness    Special needs/care consideration BiPAP/CPAP no  CPM no  Continuous Drip IV no  Dialysis no         Life Vest no  Oxygen no  Special Bed no  Trach Size no  Wound Vac (area) no       Skin - pt has R forearm skin tear covered in gauze, current R hip incision                                Bowel mgmt: last BM on 06-28-14 Bladder mgmt: currently using urinal Diabetic mgmt - yes, managed at home with medications   Previous Home Environment Living Arrangements: Spouse/significant other Available Help at Discharge: Family, Available 24 hours/day Type of Home: House Home Layout: One level Home Access: Stairs to enter Entrance Stairs-Rails: None Entrance Stairs-Number of Steps: 2 Bathroom Shower/Tub: Tub/shower unit, Architectural technologist: Standard Additional Comments: tub shower  Discharge Living Setting Plans for Discharge Living Setting: Patient's home, House Type of Home at Discharge: House Discharge Home Layout: One level Discharge  Home Access: Stairs to enter Entrance Stairs-Number of Steps: 2 Discharge Bathroom Shower/Tub: Tub/shower unit Discharge Bathroom Toilet: Standard Does the patient have any problems obtaining your medications?: No  Social/Family/Support Systems Patient Roles: Spouse, Other (Comment) (retired Charity fundraiser, 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 Information: wife [name] is primary contact Anticipated Caregiver: wife (but note pt's goals are for Mod Ind) Anticipated Caregiver's Contact Information: see above Ability/Limitations of Caregiver: none Caregiver Availability: 24/7 Discharge Plan Discussed with Primary Caregiver: Yes (discussed with pt and  his wife) Is Caregiver In Agreement with Plan?: Yes Does Caregiver/Family have Issues with Lodging/Transportation while Pt is in Rehab?: No  Goals/Additional Needs Patient/Family Goal for Rehab: Mod Ind with PT/OT; NA for SLP Expected length of stay: 7-10 days Cultural Considerations: none Dietary Needs: carb modified, thin liquids Equipment Needs: to be determined Pt/Family Agrees to Admission and willing to participate: Yes (Pt and his wife are agreeable to CIR.) Program Orientation Provided & Reviewed with Pt/Caregiver Including Roles  & Responsibilities: Yes   Decrease burden of Care through IP rehab admission: NA   Possible need for SNF placement upon discharge: not anticipated  Patient Condition: This patient's medical and functional status has changed since the consult dated: 07-01-14 in which the Rehabilitation Physician determined and documented that the patient's condition is appropriate for intensive rehabilitative care in an inpatient rehabilitation facility. See "History of Present Illness" (above) for medical update. Functional changes are: minimal to moderate assistance of two for transfers and limited gait. Patient's medical and functional status update has been discussed with the Rehabilitation physician and patient remains  appropriate for inpatient rehabilitation. Will admit to inpatient rehab today.  Preadmission Screen Completed By:  Nanetta Batty, PT, 07/04/2014 11:27 AM ______________________________________________________________________   Discussed status with Dr. Naaman Plummer on 07-04-14 at 1127 and received telephone approval for admission today.  Admission Coordinator:  Nanetta Batty, [place], time1127/Date 07-04-14

## 2014-07-04 NOTE — Discharge Summary (Signed)
Physician Discharge Summary  Christopher [name]:[id number] DOB: 12/13/1926 DOA: 06/28/2014  PCP: Tivis Ringer, MD  Admit date: 06/28/2014 Discharge date: 07/04/2014  Time spent: 65 minutes  Recommendations for Outpatient Follow-up:  1. Follow-up with Dr. Edmonia Lynch 1 week. 2. Follow-up with Dr. Wynonia Lawman of cardiology 2 weeks postdischarge for further management of patient's chronic heart failure as his diuretic dose has been decreased to 40 mg daily secondary to lightheadedness/orthostasis and fall. Patient will likely need a basic metabolic profile done at the time of follow-up.  Discharge Diagnoses:  Principal Problem:   Closed right hip fracture Active Problems:   CAD (coronary artery disease)   Cardiac pacemaker in situ   Atrial fibrillation   Lumbar disc disease   Hyperlipidemia   Kidney disease, chronic, stage III (GFR 30-59 ml/min)   Long-term (current) use of anticoagulants   BPH (benign prostatic hyperplasia)   DM type 2 causing renal disease   CHF (congestive heart failure)   Hip fracture   Fall   Intertrochanteric fracture of left hip   Fracture of femoral neck   Discharge Condition: Stable and improved  Diet recommendation: Carb modified  Filed Weights   07/02/14 0446 07/03/14 0500 07/04/14 0700  Weight: 111.086 kg (244 lb 14.4 oz) 110.4 kg (243 lb 6.2 oz) 112.7 kg (248 lb 7.3 oz)    History of present illness:  STEPFON [name] is a 81 y.o. male with past medical history of hypertension, diabetes mellitus, coronary artery disease (post status of CABG in 2008), sick sinus syndrome, pacemaker placement, BPH, chronic kidney disease-stage IV, history of prostate cancer, history of skin cancer, atrial fibrillation on Coumadin, who presented with right hip pain after fall.  Patient reported that at abut 3:00-4:00 PM, he felt lightheaded when she stood up from sitting position. He fell towards the wall on the right.He injured his right hip, developed pain over left  hip. There was small superficial skin tears on right arm. Patient denied head injury, loss of consciousness or seizure.  ROS: currently patient denied fever, chills, fatigue, running nose, ear pain, headaches, cough, chest pain, SOB, abdominal pain, diarrhea, constipation, dysuria, urgency, frequency, hematuria, skin rashes or leg swelling. No unilateral weakness, numbness or tingling sensations. No vision change or hearing loss.  In ED, patient was found to have negative CT-head for acute abdomen or masses. X-ray showed right hip non-displaced femoral neck fracture. WBC 14.5, temperature normal, INR 1.66, slightly worsening renal function. Patient was admitted to inpatient for further evaluation and treatment. Orthopedic surgery was consulted by Ed.   Hospital Course:  #1 right hip fracture Secondary to mechanical fall. Patient was admitted and orthopedic consulted. Patient was seen in consultation by Dr. Edmonia Lynch. Patient subsequently underwent hip pinning 06/30/2014 per Dr. Percell Miller. Patient pain was controlled and patient's Coumadin was resumed in addition to prophylactic low-dose dose Lovenox. Lovenox prophylactic doses to be continued until INR is therapeutic between 2-3. Patient's pain was managed. Patient was seen by physical therapy who had recommended inpatient rehabilitation. Patient was assessed for inpatient rehabilitation and was deemed an appropriate candidate and will be transferred to inpatient rehabilitation for further rehabilitation.  #2 lightheadedness Likely secondary to orthostasis secondary to diuretics. Clinical improvement. Resumed lasix at half to home dose with no further symptoms.  #3 acute on chronic kidney disease stage IV Baseline creatinine 3-3.5. Patient's creatinine on admission was 3.62 with a BUN of 75. Likely secondary to prerenal azotemia. Diuretics were held renal function trended down. Diuretics were  resumed renal function slowly trending up. Patient's home  dose diuretics were decreased to 40 mg daily as patient had presented with a fall felt to be secondary to orthostasis. Patient's renal function seems to have stabilized around 3.72. Outpatient follow-up. Monitor closely.  #4 leukocytosis Likely reactive leukocytosis. Patient remained afebrile throughout the hospitalization. Chest x-ray negative for any acute infiltrate. WBC trended down and leukocytosis had resolved by day of discharge.   #5 atrial fibrillation Chads2Vasc score is 5. Patient on Coumadin at home. INR on admission was 1.66. Continued on amiodarone and atenolol for rate control. Postoperatively patient's Coumadin was resumed. INR on day of discharge was 1.25. Goal INR is 2-3.   #6 BPH Stable. Continued on Proscar.  #7 diastolic heart failure 2-D echo from 07/24/2013 with a EF of 55-60%. Patient seemed compensated, throughout the hospitalization. Patient had no signs or symptoms of volume overload. Patient's diuretics were initially held as there was concern that patient had orthostasis prior to his fall. Patient did not have any further orthostasis and his diuretics were resumed at half his home dose at 40 mg daily. Patient's renal function stabilized and creatinine of 3.7. Patient will be discharged on Lasix 40 mg daily and will need to follow-up with his cardiologist as outpatient 2 weeks postdischarge.  #8 well controlled diabetes mellitus Hemoglobin A1c = 6.1. CBGs remained well controlled on sliding scale insulin throughout the hospitalization. Outpatient follow-up.   #9 hyperlipidemia Fasting lipid with cholesterol 216 triglycerides of 186 LDL of 142 and HDL of 39. Patient with intolerances to statins and as such is not on a statin. Continued on home regimen of Lovaza.   Procedures:  CT head 06/28/2014  Chest x-ray 06/28/2014  Cannulated hip pinning 06/30/2014 Dr. Edmonia Lynch    Consultations:  Orthopedics: Dr. Edmonia Lynch 06/28/2014    Discharge  Exam: Filed Vitals:   07/04/14 0700  BP: 93/52  Pulse: 57  Temp: 97.6 F (36.4 C)  Resp: 16    General: NAD Cardiovascular: RRR Respiratory: CTAB  Discharge Instructions   Discharge Instructions    Weight bearing as tolerated    Complete by:  As directed           Current Discharge Medication List    START taking these medications   Details  docusate sodium (COLACE) 100 MG capsule Take 1 capsule (100 mg total) by mouth 2 (two) times daily. Qty: 10 capsule, Refills: 0    HYDROcodone-acetaminophen (NORCO) 5-325 MG per tablet Take 1-2 tablets by mouth every 6 (six) hours as needed for moderate pain. Qty: 90 tablet, Refills: 0      CONTINUE these medications which have NOT CHANGED   Details  amiodarone (PACERONE) 200 MG tablet Take 200 mg by mouth every morning.    atenolol (TENORMIN) 25 MG tablet Take 25 mg by mouth every morning.     finasteride (PROSCAR) 5 MG tablet Take 5 mg by mouth daily.      folic acid (FOLVITE) 237 MCG tablet Take 400 mcg by mouth daily.      furosemide (LASIX) 40 MG tablet Take 1.5 tablets (60 mg total) by mouth 2 (two) times daily. Qty: 90 tablet, Refills: 1    glimepiride (AMARYL) 1 MG tablet Take 1 mg by mouth daily before breakfast.      Glucosamine HCl 1500 MG TABS Take 1,500 mg by mouth 2 (two) times daily.     H[place] 300 MG CAPS Take 1 capsule by mouth daily.  Multiple Vitamin (MULTIVITAMIN WITH MINERALS) TABS tablet Take 1 tablet by mouth daily.    Omega-3 Fatty Acids (FISH OIL) 1200 MG CAPS Take 1,200 mg by mouth daily.    warfarin (COUMADIN) 4 MG tablet Take 4 mg by mouth every evening.    hyoscyamine (LEVSIN, ANASPAZ) 0.125 MG tablet Take 1 tablet (0.125 mg total) by mouth every 4 (four) hours as needed (bladder spasms). Qty: 40 tablet, Refills: 4       Allergies  Allergen Reactions  . Crestor [Rosuvastatin] Other (See Comments)    Aches in joint   . Lipitor [Atorvastatin] Other (See Comments)    Joint  aching  . Penicillins Rash  . Tramadol Nausea Only  . Vicodin [Hydrocodone-Acetaminophen] Anxiety    "Made me nervous"   Follow-up Information    Follow up with MURPHY, TIMOTHY D, MD In 1 week.   Specialty:  Orthopedic Surgery   Contact information:   [address] [place] [id number] [phone]       Follow up with TILLEY JR,W SPENCER, MD. Schedule an appointment as soon as possible for a visit in 2 weeks.   Specialty:  Cardiology   Contact information:   [address] [place] [address] [phone]        The results of significant diagnostics from this hospitalization (including imaging, microbiology, ancillary and laboratory) are listed below for reference.    Significant Diagnostic Studies: Dg Chest 2 View  06/28/2014   CLINICAL DATA:  Near syncope, hip pain  EXAM: CHEST  2 VIEW  COMPARISON:  07/24/2013  FINDINGS: There is elevation of the right diaphragm. There is bibasilar atelectasis versus scarring. There is no focal consolidation, pleural effusion, or pneumothorax. The heart and mediastinum are stable. There is a dual lead cardiac pacer. There is evidence of prior CABG.  The osseous structures are unremarkable.  IMPRESSION: No active cardiopulmonary disease.   Electronically Signed   By: Kathreen Devoid   On: 06/28/2014 18:01   Ct Head Wo Contrast  06/28/2014   CLINICAL DATA:  Episode of acute dizziness with fall resulting in trauma to the right side of the head. Anticoagulation it.  EXAM: CT HEAD WITHOUT CONTRAST  TECHNIQUE: Contiguous axial images were obtained from the base of the skull through the vertex without intravenous contrast.  COMPARISON:  None.  FINDINGS: There is generalized age related atrophy. There are areas of low density in the deep white matter consistent with mild chronic small vessel disease. No sign of acute infarction, mass lesion, hemorrhage, hydrocephalus or extra-axial collection. No skull fracture. No fluid in  the sinuses, middle ears or mastoids.  IMPRESSION: No acute or traumatic finding. Age related atrophy and mild chronic small vessel disease.   Electronically Signed   By: Nelson Chimes M.D.   On: 06/28/2014 18:11   Pelvis Portable  06/30/2014   CLINICAL DATA:  Status post ORIF of the fracture of the right femoral neck  EXAM: PORTABLE PELVIS 1-2 VIEWS  COMPARISON:  Preoperative exam of June 28, 2014  FINDINGS: The patient has undergone placement of 3 screws for fixation of the right femoral neck fracture. Alignment of the fracture is near anatomic  IMPRESSION: ORIF for right femoral neck fracture without evidence of immediate postprocedure complication.   Electronically Signed   By: David  Martinique M.D.   On: 06/30/2014 13:26   Dg Hip Operative Unilat With Pelvis Right  06/30/2014   CLINICAL DATA:  Femoral neck fracture.  EXAM: OPERATIVE RIGHT HIP (WITH PELVIS IF PERFORMED)  VIEWS  TECHNIQUE: Fluoroscopic spot image(s) were submitted for interpretation post-operatively.  FLUOROSCOPY TIME:  Fluoroscopy Time:  0 minutes 45 seconds  Number of Acquired Images:  2 images  COMPARISON:  None.  FINDINGS: Patient status post right femoral neck pinning. Good anatomic alignment. Hardware intact.  IMPRESSION: ORIF right hip.   Electronically Signed   By: Marcello Moores  Register   On: 06/30/2014 13:05   Dg Hip Unilat With Pelvis 2-3 Views Right  06/28/2014   CLINICAL DATA:  Syncope, hip pain  EXAM: RIGHT HIP (WITH PELVIS) 2-3 VIEWS  COMPARISON:  None.  FINDINGS: There is a nondisplaced right femoral neck fracture. There is no other fracture or dislocation.  IMPRESSION: Nondisplaced, right femoral neck fracture.   Electronically Signed   By: Kathreen Devoid   On: 06/28/2014 18:03   Dg Femur, Min 2 Views Right  06/28/2014   CLINICAL DATA:  Syncope, hip pain  EXAM: RIGHT FEMUR 2 VIEWS  COMPARISON:  None.  FINDINGS: There is a nondisplaced right femoral neck fracture. There is no hip dislocation. There is peripheral vascular  atherosclerotic disease. Soft tissues are unremarkable.  IMPRESSION: Nondisplaced, right femoral neck fracture.   Electronically Signed   By: Kathreen Devoid   On: 06/28/2014 18:03    Microbiology: Recent Results (from the past 240 hour(s))  Culture, Urine     Status: None   Collection Time: 06/29/14  9:47 PM  Result Value Ref Range Status   Specimen Description URINE, RANDOM  Final   Special Requests NONE  Final   Colony Count   Final    2,000 COLONIES/ML Performed at [facility]    Culture   Final    INSIGNIFICANT GROWTH Performed at [facility]    Report Status 07/01/2014 FINAL  Final  Surgical pcr screen     Status: None   Collection Time: 06/30/14  7:51 AM  Result Value Ref Range Status   MRSA, PCR NEGATIVE NEGATIVE Final   Staphylococcus aureus NEGATIVE NEGATIVE Final    Comment:        The Xpert SA Assay (FDA approved for NASAL specimens in patients over 50 years of age), is one component of a comprehensive surveillance program.  Test performance has been validated by [facility] for patients greater than or equal to 58 year old. It is not intended to diagnose infection nor to guide or monitor treatment.      Labs: Basic Metabolic Panel:  Recent Labs Lab 06/30/14 0750 07/01/14 0846 07/02/14 0615 07/03/14 0550 07/04/14 0441  NA 138 137 140 138 139  K 4.9 5.3* 5.2* 5.6* 5.2*  CL 106 105 108 105 105  CO2 22 23 20 23 24   GLUCOSE 124* 135* 134* 156* 149*  BUN 64* 62* 76* 86* 86*  CREATININE 2.98* 3.14* 3.63* 3.72* 3.72*  CALCIUM 8.6 8.5 8.9 8.7 8.6   Liver Function Tests: No results for input(s): AST, ALT, ALKPHOS, BILITOT, PROT, ALBUMIN in the last 168 hours. No results for input(s): LIPASE, AMYLASE in the last 168 hours. No results for input(s): AMMONIA in the last 168 hours. CBC:  Recent Labs Lab 06/28/14 1655  06/30/14 0750 07/01/14 0846 07/02/14 0615 07/03/14 0550 07/04/14 0441  WBC 14.5*  < > 11.0* 11.4* 11.2* 10.3 9.9   NEUTROABS 12.6*  --   --  9.8*  --   --   --   HGB 10.8*  < > 9.7* 9.3* 9.1* 9.3*  9.0*  HCT 34.2*  < > 31.5* 30.1* 29.0* 29.9* 29.3*  MCV 90.2  < > 91.8 92.0 92.4 93.1 93.9  PLT 382  < > 290 294 313 327 335  < > = values in this interval not displayed. Cardiac Enzymes: No results for input(s): CKTOTAL, CKMB, CKMBINDEX, TROPONINI in the last 168 hours. BNP: BNP (last 3 results)  Recent Labs  06/29/14 0550  BNP 628.4*    ProBNP (last 3 results)  Recent Labs  07/24/13 0120  PROBNP 9261.0*    CBG:  Recent Labs Lab 07/03/14 0658 07/03/14 1209 07/03/14 1621 07/03/14 2040 07/04/14 0659  GLUCAP 166* 170* 144* 158* 147*       Signed:  Cadince Hilscher MD [facility] 07/04/2014, 11:11 AM

## 2014-07-04 NOTE — Clinical Social Work Note (Signed)
Patient discharging to CIR. No other CSW needs identified. CSW signing off.  Lubertha Sayres, [place] Cell: [phone]       Fax: [phone] Clinical Social Work: Orthopedics [phone]) and Surgical [phone])

## 2014-07-04 NOTE — Progress Notes (Signed)
Pt is ready to discharge to IR. Report gave to RN, Maudry Mayhew at [facility].

## 2014-07-04 NOTE — Care Management Note (Signed)
CARE MANAGEMENT NOTE 07/04/2014  Patient:  Christopher [name], Christopher [name]   Account Number:  [id number]  Date Initiated:  07/04/2014  Documentation initiated by:  Ricki Miller  Subjective/Objective Assessment:   81 yr old male admitted s/p fall with right hip fracture. Patient underwent a right hip pinning     Action/Plan:   Patient will go to Inpatient rehab. No case manager needs.   Anticipated DC Date:  07/04/2014   Anticipated DC Plan:  [place]  CM consult      PAC Choice  IP REHAB   Choice offered to / List presented to:  NA   DME arranged  NA        HH arranged  NA      Status of service:  Completed, signed off Medicare Important Message given?  YES (If response is "NO", the following Medicare IM given date fields will be blank) Date Medicare IM given:  07/04/2014 Medicare IM given by:  Ricki Miller Date Additional Medicare IM given:   Additional Medicare IM given by:    Discharge Disposition:  IP REHAB FACILITY  Per UR Regulation:  Reviewed for med. necessity/level of care/duration of stay  If discussed at [place] of Stay Meetings, dates discussed:

## 2014-07-04 NOTE — Interval H&P Note (Signed)
Christopher [name] was admitted today to Inpatient Rehabilitation with the diagnosis of right femoral neck fracture.  The patient's history has been reviewed, patient examined, and there is no change in status.  Patient continues to be appropriate for intensive inpatient rehabilitation.  I have reviewed the patient's chart and labs.  Questions were answered to the patient's satisfaction.  Camyla Camposano T 07/04/2014, 9:44 PM

## 2014-07-04 NOTE — Progress Notes (Signed)
ANTICOAGULATION CONSULT NOTE - FOLLOW UP  Pharmacy Consult:  Coumadin Indication: atrial fibrillation  Allergies  Allergen Reactions  . Crestor [Rosuvastatin] Other (See Comments)    Aches in joint   . Lipitor [Atorvastatin] Other (See Comments)    Joint aching  . Penicillins Rash  . Tramadol Nausea Only  . Vicodin [Hydrocodone-Acetaminophen] Anxiety    "Made me nervous"    Patient Measurements: Height: 6\' 2"  (188 cm) Weight: 248 lb 7.3 oz (112.7 kg) IBW/kg (Calculated) : 82.2  Vital Signs: Temp: 97.6 F (36.4 C) (04/25 0700) Temp Source: Oral (04/25 0700) BP: 93/52 mmHg (04/25 0700) Pulse Rate: 57 (04/25 0700)  Labs:  Recent Labs  07/02/14 0615 07/03/14 0550 07/04/14 0441  HGB 9.1* 9.3* 9.0*  HCT 29.0* 29.9* 29.3*  PLT 313 327 335  LABPROT 15.7* 16.2* 15.9*  INR 1.24 1.29 1.25  CREATININE 3.63* 3.72* 3.72*    Estimated Creatinine Clearance: 19.4 mL/min (by C-G formula based on Cr of 3.72).      Assessment: 26 YOM on Coumadin PTA for Afib presented with hip fracture, now s/p pinning.  Pharmacy is consulted to manage Coumadin. He is also started on prophylactic Lovenox post-op. INR remains relatively unchanged and sub-therapeutic on home dose. H/H and Plt remain stable.   Goal of Therapy:  INR 2-3    Plan:  - Coumadin 6 mg PO once today. May discharge on home dose when ready  - Continue Lovenox 30mg  SQ Q24H until INR therapeutic - Daily PT / INR    Albertina Parr, PharmD., BCPS Clinical Pharmacist Pager [phone]

## 2014-07-04 NOTE — Progress Notes (Signed)
     Subjective:  POD #4 R hip pinning. Patient reports pain as mild.  Resting comfortably in bed.   Objective:   VITALS:   Filed Vitals:   07/03/14 0500 07/03/14 0955 07/03/14 2041 07/04/14 0700  BP: 99/58  95/44 93/52  Pulse: 64  53 57  Temp: 98.7 F (37.1 C)  98.6 F (37 C) 97.6 F (36.4 C)  TempSrc:   Oral Oral  Resp: 18  16 16   Height:      Weight: 110.4 kg (243 lb 6.2 oz)   112.7 kg (248 lb 7.3 oz)  SpO2: 99% 95% 92% 96%    Neurologically intact ABD soft Neurovascular intact Sensation intact distally Intact pulses distally Dorsiflexion/Plantar flexion intact Incision: dressing C/D/I   Lab Results  Component Value Date   WBC 9.9 07/04/2014   HGB 9.0* 07/04/2014   HCT 29.3* 07/04/2014   MCV 93.9 07/04/2014   PLT 335 07/04/2014   BMET    Component Value Date/Time   NA 139 07/04/2014 0441   K 5.2* 07/04/2014 0441   CL 105 07/04/2014 0441   CO2 24 07/04/2014 0441   GLUCOSE 149* 07/04/2014 0441   BUN 86* 07/04/2014 0441   CREATININE 3.72* 07/04/2014 0441   CALCIUM 8.6 07/04/2014 0441   GFRNONAA 14* 07/04/2014 0441   GFRAA 16* 07/04/2014 0441     Assessment/Plan: 4 Days Post-Op   Principal Problem:   Closed right hip fracture Active Problems:   CAD (coronary artery disease)   Cardiac pacemaker in situ   Atrial fibrillation   Lumbar disc disease   Hyperlipidemia   Kidney disease, chronic, stage III (GFR 30-59 ml/min)   Long-term (current) use of anticoagulants   BPH (benign prostatic hyperplasia)   DM type 2 causing renal disease   CHF (congestive heart failure)   Hip fracture   Intertrochanteric fracture of left hip   Fracture of femoral neck   Up with therapy WBAT in the RLE Lovenox/Coumadin for DVT prophylaxis Signing off, stable from ortho standpoint to d/c to rehab once cleared by medicine and bed available.  Please call with any questions or concerns.    Terin Cragle Lelan Pons 07/04/2014, 8:53 AM Cell [phone]

## 2014-07-05 ENCOUNTER — Inpatient Hospital Stay (HOSPITAL_COMMUNITY): Payer: Medicare Other

## 2014-07-05 ENCOUNTER — Inpatient Hospital Stay (HOSPITAL_COMMUNITY): Payer: Medicare Other | Admitting: Occupational Therapy

## 2014-07-05 DIAGNOSIS — S72001S Fracture of unspecified part of neck of right femur, sequela: Secondary | ICD-10-CM

## 2014-07-05 DIAGNOSIS — I4891 Unspecified atrial fibrillation: Secondary | ICD-10-CM

## 2014-07-05 DIAGNOSIS — I119 Hypertensive heart disease without heart failure: Secondary | ICD-10-CM

## 2014-07-05 DIAGNOSIS — N183 Chronic kidney disease, stage 3 (moderate): Secondary | ICD-10-CM

## 2014-07-05 LAB — COMPREHENSIVE METABOLIC PANEL
ALBUMIN: 2.6 g/dL — AB (ref 3.5–5.2)
ALT: 10 U/L (ref 0–53)
ANION GAP: 9 (ref 5–15)
AST: 28 U/L (ref 0–37)
Alkaline Phosphatase: 58 U/L (ref 39–117)
BUN: 83 mg/dL — AB (ref 6–23)
CALCIUM: 8.9 mg/dL (ref 8.4–10.5)
CO2: 24 mmol/L (ref 19–32)
CREATININE: 3.05 mg/dL — AB (ref 0.50–1.35)
Chloride: 106 mmol/L (ref 96–112)
GFR calc Af Amer: 20 mL/min — ABNORMAL LOW (ref >90)
GFR calc non Af Amer: 17 mL/min — ABNORMAL LOW (ref >90)
GLUCOSE: 130 mg/dL — AB (ref 70–99)
POTASSIUM: 5.2 mmol/L — AB (ref 3.5–5.1)
Sodium: 139 mmol/L (ref 135–145)
TOTAL PROTEIN: 5.7 g/dL — AB (ref 6.0–8.3)
Total Bilirubin: 0.4 mg/dL (ref 0.3–1.2)

## 2014-07-05 LAB — GLUCOSE, CAPILLARY
GLUCOSE-CAPILLARY: 189 mg/dL — AB (ref 70–99)
GLUCOSE-CAPILLARY: 137 mg/dL — AB (ref 70–99)
Glucose-Capillary: 134 mg/dL — ABNORMAL HIGH (ref 70–99)

## 2014-07-05 LAB — PROTIME-INR
INR: 1.53 — AB (ref 0.00–1.49)
Prothrombin Time: 18.5 seconds — ABNORMAL HIGH (ref 11.6–15.2)

## 2014-07-05 MED ORDER — WARFARIN SODIUM 5 MG PO TABS
5.0000 mg | ORAL_TABLET | Freq: Once | ORAL | Status: AC
  Administered 2014-07-05: 5 mg via ORAL
  Filled 2014-07-05: qty 1

## 2014-07-05 NOTE — Progress Notes (Signed)
PHYSICAL MEDICINE & REHABILITATION     PROGRESS NOTE    Subjective/Complaints: Struggled with bowels overnight. Wife fell when she missed recliner while attempting to sit down---now down in the ED  Objective: Vital Signs: Blood pressure 114/47, pulse 61, temperature 98.6 F (37 C), temperature source Oral, resp. rate 17, weight 113.1 kg (249 lb 5.4 oz), SpO2 92 %. No results found.  Recent Labs  07/03/14 0550 07/04/14 0441  WBC 10.3 9.9  HGB 9.3* 9.0*  HCT 29.9* 29.3*  PLT 327 335    Recent Labs  07/03/14 0550 07/04/14 0441  NA 138 139  K 5.6* 5.2*  CL 105 105  GLUCOSE 156* 149*  BUN 86* 86*  CREATININE 3.72* 3.72*  CALCIUM 8.7 8.6   CBG (last 3)   Recent Labs  07/04/14 1633 07/04/14 2143 07/05/14 0648  GLUCAP 127* 166* 189*    Wt Readings from Last 3 Encounters:  07/05/14 113.1 kg (249 lb 5.4 oz)  07/04/14 112.7 kg (248 lb 7.3 oz)  07/28/13 101.6 kg (223 lb 15.8 oz)    Physical Exam:  Constitutional: He is oriented to person, place, and time. He appears well-developed and well-nourished.  HENT:  Head: Normocephalic and atraumatic.  Right Ear: External ear normal.  Left Ear: External ear normal.  Eyes: Conjunctivae are normal. Pupils are equal, round, and reactive to light.  Neck: Normal range of motion. Neck supple.  Cardiovascular: Normal rate and regular rhythm. Exam reveals no gallop and no friction rub.  No murmur heard. Respiratory: Effort normal and breath sounds normal. No respiratory distress. He has no wheezes. He has no rales.  GI: Soft. He exhibits distension. Bowel sounds are decreased. There is no tenderness.  Musculoskeletal:  Min edema right hip and thigh. Small incisions which are intact, covered with foam dressing. Superficial area of avulsed skin on right lateral forearm. Right thigh tender with PROM still  Neurological: He is alert and oriented to person, place, and time. No cranial nerve deficit. Coordination  normal.  Right Hip and knee pain with lower extremity range of motion no pain with ankle range of motion No pain with left lower extremity range of motion Upper extremity strength 5/5 bilateral deltoids, biceps, triceps, grip Left lower extremity 4/5 and hip flexor and extensor ankle dorsal flexion plantar flexion Trace right hip flexion knee extension 4 minus ankle dorsiflexion plantarflexion Skin: Skin is warm and dry.  Psychiatric: He has a normal mood and affect. His behavior is normal.     Assessment/Plan: 1. Functional deficits secondary to right femoral neck fracture which require 3+ hours per day of interdisciplinary therapy in a comprehensive inpatient rehab setting. Physiatrist is providing close team supervision and 24 hour management of active medical problems listed below. Physiatrist and rehab team continue to assess barriers to discharge/monitor patient progress toward functional and medical goals. FIM:                   Comprehension Comprehension Mode: Auditory Comprehension: 5-Follows basic conversation/direction: With no assist  Expression Expression Mode: Verbal Expression: 5-Expresses basic needs/ideas: With no assist  Social Interaction Social Interaction: 6-Interacts appropriately with others with medication or extra time (anti-anxiety, antidepressant).  Problem Solving Problem Solving: 5-Solves basic problems: With no assist  Memory Memory: 6-More than reasonable amt of time   Medical Problem List and Plan: 1. Functional deficits secondary to right femoral neck fracture after fall s/p pinning 2. DVT Prophylaxis/Anticoagulation: Pharmaceutical: Lovenox until coumadin therapeutic 3. Pain Management:  Will use oxycodone prn. 4. Mood: Motivated to get better. LCSW to follow for evaluation and support.  5. Neuropsych: This patient is capable of making decisions on his own behalf. 6. Skin/Wound Care: Routine pressure relief measures. local  care.  7. Fluids/Electrolytes/Nutrition: Monitor I/O. Encourage patient to push fluids.  8. DM type 2: Reports po intake has been good. Will monitor BS with ac/hs checks. Resumed amaryl   -follow for pattern 9. A fib: Will monitor HR every 8 hours. Continue amiodarone and atenolol for rate control.  10. Constipation: some results with supp. Try fleet enema this am 11. HTN: Monitor BP every 8 hours. Continue lasix 40 mg daily as well as porscar and tenormin. 12. Acute on chronic anemia: Will add iron supplement.   LOS (Days) 1 A FACE TO FACE EVALUATION WAS PERFORMED  Christopher [name] 07/05/2014 7:49 AM

## 2014-07-05 NOTE — Progress Notes (Signed)
[facility] Admission Coordinator Signed Physical Medicine and Rehabilitation PMR Pre-admission 07/04/2014 11:19 AM  Related encounter: ED to Hosp-Admission (Discharged) from 06/28/2014 in [facility]   PMR Admission Coordinator Pre-Admission Assessment  Patient: Christopher [name] is an 81 y.o., male MRN: [id number] DOB: Jul 18, 1926 Height: 6\' 2"  (188 cm) Weight: 112.7 kg (248 lb 7.3 oz)  Insurance Information  PRIMARY: Medicare A & B Policy#: [id number] Subscriber: self Pre-Cert#: verified in [employer]: retired Runner, broadcasting/film/video. Date: A & B: 11-09-93 Deduct: $1288 Out of Pocket Max: none Life Max: unlimited CIR: 100% SNF: 100% days 1-20; 80% days 21-100 (100 days max) Outpatient: 80% Co-Pay: 20% Home Health: 100% Co-Pay: none DME: 80% Co-Pay: 20% Providers: pt's preference  SECONDARY: AARP Medicare Supplement Policy#: [id number] Subscriber: self Benefits: Phone #: [phone]   Emergency Contact Information Contact Information    Name Relation Home Work Mobile   [place] Spouse [phone]  [phone]     Current Medical History  Patient Admitting Diagnosis: Right femoral neck fracture s/p pinning  History of Present Illness: Christopher [name] is a 81 y.o. right handed male with history of hypertension, diabetes mellitus and peripheral neuropathy, CAD with CABG/pacemaker, chronic kidney disease with baseline creatinine 3.62 and atrial fibrillation with chronic Coumadin therapy. Patient lives with his wife independent with a cane prior to admission. Admitted 06/28/2014 after a fall without loss of consciousness. He did note some lightheadedness when he stood from the sitting position following  forward on his right hip. Cranial CT scan was negative. X-rays of right hip showed a nondisplaced femoral neck fracture. INR on admission of 1.66. Underwent cannulated hip pinning 06/30/2014 per Dr. Percell Miller. Weightbearing as tolerated right lower extremity. Chronic Coumadin has been resumed. Hospital course pain management. Acute blood loss anemia 9.3 and monitored. Physical therapy evaluation completed with recommendations of physical medicine rehabilitation consult.  Past Medical History  Past Medical History  Diagnosis Date  . Lumbar disc disease   . BPH (benign prostatic hyperplasia)   . HTN (hypertension)   . Diabetes mellitus type II   . Sinus node dysfunction     symptomatic bradycardia  . Pacemaker     Medtronic-dual-chamber-DOI 2012  . CAD (coronary artery disease)     followed by Dr. Wynonia Lawman  . Arthritis   . History of skin cancer   . Frequency of urination   . Nocturia   . Chronic kidney disease     kidneys function at "20 %" followed by Dr. Mercy Moore  . Cancer     history of skin cancer  . Dysrhythmia     a fib  . Prostate cancer   . Ruptured disk 1992  . Skin cancer     Family History  family history includes Cancer in his father and mother.  Prior Rehab/Hospitalizations: pt had previous CABG and then had follow up cardiac rehab.  Current Medications   Current facility-administered medications:  . acetaminophen (TYLENOL) tablet 650 mg, 650 mg, Oral, Q6H PRN **OR** acetaminophen (TYLENOL) suppository 650 mg, 650 mg, Rectal, Q6H PRN, Irine Seal V, MD . amiodarone (PACERONE) tablet 200 mg, 200 mg, Oral, q morning - 10a, Ivor Costa, MD, 200 mg at 07/04/14 0841 . atenolol (TENORMIN) tablet 25 mg, 25 mg, Oral, q morning - 10a, Ivor Costa, MD, 25 mg at 07/03/14 1001 . bacitracin ointment, , Topical, BID, Tori Milks, MD . docusate sodium (COLACE) capsule 100 mg, 100 mg, Oral, BID,  Eugenie Filler, MD, 100 mg at 07/04/14 0840 . enoxaparin (LOVENOX) injection 30 mg, 30 mg, Subcutaneous, Q24H, Eugenie Filler, MD, 30 mg at 07/04/14 0840 . finasteride (PROSCAR) tablet 5 mg, 5 mg, Oral, Daily, Ivor Costa, MD, 5 mg at 07/04/14 0841 . folic acid (FOLVITE) tablet 0.5 mg, 500 mcg, Oral, Daily, Ivor Costa, MD, 0.5 mg at 07/04/14 0842 . furosemide (LASIX) tablet 40 mg, 40 mg, Oral, Daily, Irine Seal V, MD, 40 mg at 07/04/14 0840 . HYDROcodone-acetaminophen (NORCO/VICODIN) 5-325 MG per tablet 1-2 tablet, 1-2 tablet, Oral, Q6H PRN, Eugenie Filler, MD, 2 tablet at 07/04/14 [phone] . HYDROmorphone (DILAUDID) injection 1 mg, 1 mg, Intravenous, Q3H PRN, Gardiner Barefoot, NP, 1 mg at 07/04/14 1046 . ibuprofen (ADVIL,MOTRIN) tablet 400 mg, 400 mg, Oral, Q6H PRN, Ivor Costa, MD . insulin aspart (novoLOG) injection 0-9 Units, 0-9 Units, Subcutaneous, TID WC, Ivor Costa, MD, 1 Units at 07/04/14 [phone] . menthol-cetylpyridinium (CEPACOL) lozenge 3 mg, 1 lozenge, Oral, PRN **OR** phenol (CHLORASEPTIC) mouth spray 1 spray, 1 spray, Mouth/Throat, PRN, Irine Seal V, MD . morphine 2 MG/ML injection 0.5 mg, 0.5 mg, Intravenous, Q2H PRN, Eugenie Filler, MD . multivitamin with minerals tablet 1 tablet, 1 tablet, Oral, Daily, Ivor Costa, MD, 1 tablet at 07/04/14 0840 . omega-3 acid ethyl esters (LOVAZA) capsule 1 g, 1 g, Oral, Daily, Ivor Costa, MD, 1 g at 07/04/14 0841 . ondansetron (ZOFRAN) tablet 4 mg, 4 mg, Oral, Q6H PRN **OR** ondansetron (ZOFRAN) injection 4 mg, 4 mg, Intravenous, Q6H PRN, Ivor Costa, MD, 4 mg at 07/02/14 2041 . oxyCODONE (Oxy IR/ROXICODONE) immediate release tablet 5 mg, 5 mg, Oral, Q6H PRN, Gardiner Barefoot, NP, 5 mg at 07/03/14 2158 . sodium chloride 0.9 % injection 3 mL, 3 mL, Intravenous, Q12H, Ivor Costa, MD, 3 mL at 07/03/14 2159 . warfarin (COUMADIN) tablet 6 mg, 6 mg, Oral, ONCE-1800, Lavenia Atlas, RPH . Warfarin - Pharmacist Dosing Inpatient,  , Does not apply, q1800, Leodis Sias, RPH  Patients Current Diet: Diet Carb Modified Fluid consistency:: Thin; Room service appropriate?: Yes  Precautions / Restrictions Precautions Precautions: Fall Precaution Comments: watch BP Restrictions Weight Bearing Restrictions: Yes RLE Weight Bearing: Weight bearing as tolerated   Prior Activity Level Community (5-7x/wk): Pt got out everyday and he and his wife ran errands together. 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    Home Assistive Devices / Equipment Home Equipment: Cane - single point, Shower seat  Prior Functional Level Prior Function Level of Independence: Independent with assistive device(s)  Current Functional Level Cognition  Overall Cognitive Status: Within Functional Limits for tasks assessed Orientation Level: Oriented X4   Extremity Assessment (includes Sensation/Coordination)  Upper Extremity Assessment: Overall WFL for tasks assessed  Lower Extremity Assessment: Defer to PT evaluation RLE Deficits / Details: AAROM limited by pain, performed ankle pumps without difficulty RLE: Unable to fully assess due to pain    ADLs  Overall ADL's : Needs assistance/impaired General ADL Comments: Patient unable to tolerate this OT ADL session secondary to nausea and fatigue. Patient found supine in bed with onset of nausea during OT eval. WIll continue to follow from OT standpoint and from speaking with PT and reading PT notes, currently recommending CIR for rehab prior to d/c>home.     Mobility  Overal bed mobility: Needs Assistance Bed Mobility: Supine to Sit Supine to sit: HOB elevated, Min assist  Sit to supine: +2 for physical assistance, Mod assist General bed mobility comments: Min A for RLE to EOB. Use of rails to position trunk upright and scoot to EOB.    Transfers  Overall transfer level: Needs  assistance Equipment used: Rolling walker (2 wheeled) Transfers: Sit to/from Stand Sit to Stand: Mod assist, +2 physical assistance Stand pivot transfers: Mod assist General transfer comment: Pt able to stand on second attempt with bed elevated. Mod A for anterior weight shift and to power up into standing.    Ambulation / Gait / Stairs / Wheelchair Mobility  Ambulation/Gait Ambulation/Gait assistance: Min assist, +2 safety/equipment Ambulation Distance (Feet): 5 Feet Assistive device: Rolling walker (2 wheeled) Gait Pattern/deviations: Step-to pattern, Decreased stance time - right, Antalgic, Trunk flexed Gait velocity: decreased Gait velocity interpretation: Below normal speed for age/gender General Gait Details: Pt continues to rely heavily on RW and c/o UE fatigue during ambulation. Cues for gait sequence, hand placement on RW, and upright posture. Pt appeared to be more fatigued and unable to ambulate as far this session.    Posture / Balance Dynamic Sitting Balance Sitting balance - Comments: sat edge of bed to get BP x about 4 minutes Balance Overall balance assessment: Needs assistance Sitting balance-Leahy Scale: Good Sitting balance - Comments: sat edge of bed to get BP x about 4 minutes Standing balance support: Bilateral upper extremity supported Standing balance-Leahy Scale: Poor Standing balance comment: standing with UE support, needed at least min assist for safety due to fatigue and sinking a little with postural weakness    Special needs/care consideration BiPAP/CPAP no  CPM no  Continuous Drip IV no  Dialysis no  Life Vest no  Oxygen no  Special Bed no  Trach Size no  Wound Vac (area) no  Skin - pt has R forearm skin tear covered in gauze, current R hip incision   Bowel mgmt: last BM on 06-28-14 Bladder mgmt: currently using urinal Diabetic mgmt - yes, managed at home with medications   Previous  Home Environment Living Arrangements: Spouse/significant other Available Help at Discharge: Family, Available 24 hours/day Type of Home: House Home Layout: One level Home Access: Stairs to enter Entrance Stairs-Rails: None Entrance Stairs-Number of Steps: 2 Bathroom Shower/Tub: Public librarian, Architectural technologist: Standard Additional Comments: tub shower  Discharge Living Setting Plans for Discharge Living Setting: Patient's home, House Type of Home at Discharge: House Discharge Home Layout: One level Discharge Home Access: Stairs to enter Entrance Stairs-Number of Steps: 2 Discharge Bathroom Shower/Tub: Tub/shower unit Discharge Bathroom Toilet: Standard Does the patient have any problems obtaining your medications?: No  Social/Family/Support Systems Patient Roles: Spouse, Other (Comment) (retired Charity fundraiser, 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 Information: wife [name] is primary contact Anticipated Caregiver: wife (but note pt's goals are for Mod Ind) Anticipated Caregiver's Contact Information: see above Ability/Limitations of Caregiver: none Caregiver Availability: 24/7 Discharge Plan Discussed with Primary Caregiver: Yes (discussed with pt and his wife) Is Caregiver In Agreement with Plan?: Yes Does Caregiver/Family have Issues with Lodging/Transportation while Pt is in Rehab?: No  Goals/Additional Needs Patient/Family Goal for Rehab: Mod Ind with PT/OT; NA for SLP Expected length of stay: 7-10 days Cultural Considerations: none Dietary Needs: carb modified, thin liquids Equipment Needs: to be determined Pt/Family Agrees to Admission and willing to participate: Yes (Pt and his wife are agreeable to CIR.) Program Orientation Provided & Reviewed with Pt/Caregiver Including Roles & Responsibilities: Yes   Decrease burden of Care through IP rehab admission: NA   Possible  need for SNF placement upon discharge: not anticipated  Patient Condition: This patient's  medical and functional status has changed since the consult dated: 07-01-14 in which the Rehabilitation Physician determined and documented that the patient's condition is appropriate for intensive rehabilitative care in an inpatient rehabilitation facility. See "History of Present Illness" (above) for medical update. Functional changes are: minimal to moderate assistance of two for transfers and limited gait. Patient's medical and functional status update has been discussed with the Rehabilitation physician and patient remains appropriate for inpatient rehabilitation. Will admit to inpatient rehab today.  Preadmission Screen Completed By: Nanetta Batty, PT, 07/04/2014 11:27 AM ______________________________________________________________________  Discussed status with Dr. Naaman Plummer on 07-04-14 at 1127 and received telephone approval for admission today.  Admission Coordinator: Nanetta Batty, PT, time1127/Date 07-04-14          Cosigned by: [name], MD at 07/04/2014 11:39 AM  Revision History     Date/Time User Provider Type Action   07/04/2014 11:39 AM [name], MD Physician Cosign   07/04/2014 11:35 AM [facility] Admission Coordinator Sign

## 2014-07-05 NOTE — Progress Notes (Signed)
Patient information reviewed and entered into eRehab system by Eilyn Polack, RN, CRRN, PPS Coordinator.  Information including medical coding and functional independence measure will be reviewed and updated through discharge.     Per nursing patient was given "Data Collection Information Summary for Patients in Inpatient Rehabilitation Facilities with attached "Privacy Act Statement-Health Care Records" upon admission.  

## 2014-07-05 NOTE — Progress Notes (Signed)
ANTICOAGULATION CONSULT NOTE - Follow Up Consult  Pharmacy Consult for couamdin Indication: atrial fibrillation  Allergies  Allergen Reactions  . Crestor [Rosuvastatin] Other (See Comments)    Aches in joint   . Lipitor [Atorvastatin] Other (See Comments)    Joint aching  . Penicillins Rash  . Tramadol Nausea Only  . Vicodin [Hydrocodone-Acetaminophen] Anxiety    "Made me nervous"    Patient Measurements: Weight: 249 lb 5.4 oz (113.1 kg) Heparin Dosing Weight:   Vital Signs: Temp: 98.6 F (37 C) (04/26 0515) Temp Source: Oral (04/26 0515) BP: 114/47 mmHg (04/26 0515) Pulse Rate: 61 (04/26 0515)  Labs:  Recent Labs  07/03/14 0550 07/04/14 0441  HGB 9.3* 9.0*  HCT 29.9* 29.3*  PLT 327 335  LABPROT 16.2* 15.9*  INR 1.29 1.25  CREATININE 3.72* 3.72*    Estimated Creatinine Clearance: 19.4 mL/min (by C-G formula based on Cr of 3.72).   Medications:  Scheduled:  . amiodarone  200 mg Oral q morning - 10a  . atenolol  25 mg Oral Q breakfast  . bacitracin   Topical BID  . enoxaparin (LOVENOX) injection  30 mg Subcutaneous Q24H  . finasteride  5 mg Oral Daily  . folic acid  832 mcg Oral Daily  . furosemide  40 mg Oral Daily  . glimepiride  1 mg Oral Q breakfast  . insulin aspart  0-9 Units Subcutaneous TID WC  . iron polysaccharides  150 mg Oral BID AC  . multivitamin with minerals  1 tablet Oral Daily  . omega-3 acid ethyl esters  1 g Oral Daily  . Warfarin - Pharmacist Dosing Inpatient   Does not apply q1800   Infusions:    Assessment: 81 yo male with hx of afib is currently on subtherapeutic coumadin.  INR today is up to 1.53 from 1.25.  Patient is also on lovenox 30 mg sq q24h.  Goal of Therapy:  INR 2-3 Monitor platelets by anticoagulation protocol: Yes   Plan:  - Coumadin 5 mg PO once today.  - Continue Lovenox 30mg  SQ Q24H until INR therapeutic - Daily PT / INR  Aashka Salomone, Tsz-Yin 07/05/2014,8:44 AM

## 2014-07-05 NOTE — Progress Notes (Signed)
Pt complained of abdominal pain and of being unable to have a BM since 4/19. Suppository given @ 2100 with minimal results. Disimpaction done @ 0015 and patient had medium size BM. Pt states that he still feels mildly uncomfortable but feels much better. Discussed the option of FLEETS enema and patient is in agreement to do so tomorrow if no results in the AM. Will continue to monitor.

## 2014-07-05 NOTE — Evaluation (Signed)
Physical Therapy Assessment and Plan  Patient Details  Name: Christopher [name] MRN: [id number] Date of Birth: 10/16/1926  PT Diagnosis: Abnormality of gait, Difficulty walking, Edema, Muscle weakness and Pain in joint Rehab Potential: Good ELOS: 10-14 days   Today's Date: 07/05/2014 PT Individual Time: 0800-0900 PT Individual Time Calculation (min): 60 min    Problem List:  Patient Active Problem List   Diagnosis Date Noted  . Fracture of femoral neck, right   . Intertrochanteric fracture of left hip   . Closed right hip fracture 06/28/2014  . CHF (congestive heart failure) 06/28/2014  . Hip fracture 06/28/2014  . Fall   . Acute diastolic congestive heart failure, NYHA class 2 07/24/2013  . Acute respiratory failure 07/24/2013  . DM type 2 causing renal disease 07/24/2013  . Shortness of breath 07/24/2013  . Malignant neoplasm of prostate 07/16/2013  . History of amiodarone therapy 07/16/2012  . Atrial fibrillation 08/07/2011  . Lumbar disc disease   . Hyperlipidemia   . Hypertensive heart disease without CHF   . Kidney disease, chronic, stage III (GFR 30-59 ml/min)   . Long-term (current) use of anticoagulants   . BPH (benign prostatic hyperplasia)   . Cardiac pacemaker in situ   . CAD (coronary artery disease) 03/06/2010    Past Medical History:  Past Medical History  Diagnosis Date  . Lumbar disc disease   . BPH (benign prostatic hyperplasia)   . HTN (hypertension)   . Diabetes mellitus type II   . Sinus node dysfunction     symptomatic bradycardia  . Pacemaker     Medtronic-dual-chamber-DOI 2012  . CAD (coronary artery disease)     followed by Dr. Wynonia Lawman  . Arthritis   . History of skin cancer   . Frequency of urination   . Nocturia   . Chronic kidney disease     kidneys function at "20 %" followed by Dr. Mercy Moore  . Cancer     history of skin cancer  . Dysrhythmia     a fib  . Prostate cancer   . Ruptured disk 1992  . Skin cancer    Past Surgical  History:  Past Surgical History  Procedure Laterality Date  . Coronary artery bypass graft  2003    x3  . Cardiac catheterization    . Lumbar laminectomy  11/01  . Cardioversion  08/08/2011    Procedure: CARDIOVERSION;  Surgeon: Jacolyn Reedy, MD;  Location: [facility];  Service: Cardiovascular;  Laterality: N/A;  . Cardioversion N/A 07/16/2012    Procedure: CARDIOVERSION;  Surgeon: Jacolyn Reedy, MD;  Location: [facility];  Service: Cardiovascular;  Laterality: N/A;  . Pacemaker insertion  2012  . Cataracts removed    . Cystoscopy with biopsy N/A 06/21/2013    Procedure: CYSTOSCOPY WITH BIOPSY;  Surgeon: Molli Hazard, MD;  Location: [facility];  Service: Urology;  Laterality: N/A;    TUR RESECTION OF POLYP BILATERAL RETROGRADE PYELOGRAM BLADDER BIOPSY    . Prostate biopsy N/A 06/21/2013    Procedure: BIOPSY TRANSRECTAL ULTRASONIC PROSTATE (TUBP);  Surgeon: Molli Hazard, MD;  Location: [facility];  Service: Urology;  Laterality: N/A;  PROSTATE NERVE BLOCK  . Appendectomy  1980  . Hip pinning,cannulated Right 06/30/2014    Procedure: CANNULATED HIP PINNING;  Surgeon: Renette Butters, MD;  Location: [facility];  Service: Orthopedics;  Laterality: Right;    Assessment & Plan Clinical Impression: Patient is a 81 y.o. year old male with with H/o HTN,  DM type 2, CAD with CABG/PPM, CKD- baseline Cr- 3.62, A Fib-chronic Coumadin; who was admitted on 06/28/2014 after a fall. No LOC but he reported some lightheadedness when he stood from the sitting position with subsequent fall onto his right hip. Cranial CT scan was negative. X-rays of right hip showed a nondisplaced femoral neck fracture.He underwent cannulated hip pinning 06/30/2014 per Dr. Edmonia Lynch the same day and is WBAT RLE. Fall likely due to orthostatic symptoms and diuretics were held briefly. Reactive leucocytosis is resolving with decrease in WBC - 9.9, ABLA on chronic anemia is being monitored with hgb - 9.0. He continues  on Lovenox bridge to Coumadin as INR subtherapeutic at 1.25. Therapy ongoing and CIR was recommended by MD and rehab team.  Patient transferred to CIR on 07/04/2014 .   Patient currently requires mod with mobility secondary to muscle weakness and muscle joint tightness, decreased cardiorespiratoy endurance, decreased memory and decreased sitting balance, decreased standing balance and decreased balance strategies.  Prior to hospitalization, patient was modified independent  with mobility and lived with Spouse in a House home.  Home access is 2Stairs to enter.  Patient will benefit from skilled PT intervention to maximize safe functional mobility, minimize fall risk and decrease caregiver burden for planned discharge home with 24 hour supervision.  Anticipate patient will benefit from follow up [place] at discharge.  PT - End of Session Activity Tolerance: Decreased this session Endurance Deficit: Yes PT Assessment Rehab Potential (ACUTE/IP ONLY): Good PT Patient demonstrates impairments in the following area(s): Balance;Edema;Endurance;Motor;Pain;Skin Integrity PT Transfers Functional Problem(s): Bed Mobility;Bed to Chair;Car;Furniture PT Locomotion Functional Problem(s): Ambulation;Wheelchair Mobility;Stairs PT Plan PT Intensity: Minimum of 1-2 x/day ,45 to 90 minutes PT Frequency: 5 out of 7 days PT Duration Estimated Length of Stay: 10-14 days PT Treatment/Interventions: Ambulation/gait training;Balance/vestibular training;Community reintegration;Discharge planning;Disease management/prevention;DME/adaptive equipment instruction;Functional mobility training;Neuromuscular re-education;Pain management;Patient/family education;Psychosocial support;Skin care/wound management;Splinting/orthotics;Stair training;Therapeutic Activities;Therapeutic Exercise;UE/LE Strength taining/ROM;UE/LE Coordination activities;Wheelchair propulsion/positioning PT Transfers Anticipated Outcome(s): S overall; min A car PT  Locomotion Anticipated Outcome(s): S gait household distances; mod I w/c mobility PT Recommendation Follow Up Recommendations: Home health PT;24 hour supervision/assistance Patient destination: Home Equipment Recommended: Rolling walker with 5" wheels;Wheelchair (measurements);Wheelchair cushion (measurements)  Skilled Therapeutic Intervention Individual treatment initiated with focus on functional bed mobility and transfers, gait training with RW, introduced up/down 1 step to prepare for home entry, and overall endurance. Pt limited by pain and decreased endurance but mobility improved once he was up and moving more. Pt reports his memory has been impaired since he came to the hospital and feels like he is forgetting a lot of things. Suspect this could be medication related. Cues needed throughout session for safe technique for transfers and carryover of new information. Left with family in room and up in w/c at end of session.   PT Evaluation Precautions/Restrictions Precautions Precautions: Fall Restrictions RLE Weight Bearing: Weight bearing as tolerated  Pain Pain Assessment Pain Assessment: 0-10 Pain Score: 8   RN notified and administered pain medication Home Living/Prior Functioning Home Living Available Help at Discharge: Family;Available 24 hours/day Type of Home: House Home Access: Stairs to enter [employer] of Steps: 2 Entrance Stairs-Rails: Left Home Layout: One level  Lives With: Spouse Prior Function Level of Independence: Requires assistive device for independence;Independent with basic ADLs  Able to Take Stairs?: Yes Driving: Yes Comments: Using SPC for mobility due to back pain last seveal months  Cognition Overall Cognitive Status: Within Functional Limits for tasks assessed Memory:  (Pt reports  issues with memory since hosptial admission) Safety/Judgment: Appears intact Sensation Sensation Light Touch: Appears Intact Proprioception: Appears  Intact Coordination Gross Motor Movements are Fluid and Coordinated: No (RLE limited due to post op pain and weakness) Motor  Motor Motor: Within Functional Limits Motor - Skilled Clinical Observations: post op pain and weakness in RLE     Trunk/Postural Assessment  Cervical Assessment Cervical Assessment: Within Functional Limits (forward head) Thoracic Assessment Thoracic Assessment: Exceptions to [facility] (flexed posture) Lumbar Assessment Lumbar Assessment: Exceptions to [facility] (posterior pelvic tilt) Postural Control Postural Control: Within Functional Limits  Balance Balance Balance Assessed: Yes Static Sitting Balance Static Sitting - Level of Assistance: 5: Stand by assistance Dynamic Sitting Balance Dynamic Sitting - Level of Assistance: 4: Min assist;5: Stand by assistance Static Standing Balance Static Standing - Level of Assistance: 4: Min assist Dynamic Standing Balance Dynamic Standing - Level of Assistance: 4: Min assist Extremity Assessment      RLE Assessment RLE Assessment: Exceptions to [facility] RLE Strength RLE Overall Strength Comments: 2+/5 at hip; 3-/5 knee and ankle WFL LLE Assessment LLE Assessment: Within Functional Limits (decreased muscular endurance)  FIM:  FIM - Bed/Chair Transfer Bed/Chair Transfer Assistive Devices: Adult nurse Transfer: 3: Supine > Sit: Mod A (lifting assist/Pt. 50-74%/lift 2 legs;4: Sit > Supine: Min A (steadying pt. > 75%/lift 1 leg);3: Bed > Chair or W/C: Mod A (lift or lower assist);3: Chair or W/C > Bed: Mod A (lift or lower assist) FIM - Locomotion: Wheelchair Locomotion: Wheelchair: 1: Travels less than 50 ft with minimal assistance (Pt.>75%) FIM - Locomotion: Ambulation Locomotion: Ambulation Assistive Devices: Walker - Rolling Locomotion: Ambulation: 1: Travels less than 50 ft with minimal assistance (Pt.>75%) FIM - Locomotion: Stairs Locomotion: Scientist, physiological: Hand rail - 2 Locomotion: Stairs: 1: Up and  Down < 4 stairs with minimal assistance (Pt.>75%)   Refer to Care Plan for Long Term Goals  Recommendations for other services: None  Discharge Criteria: Patient will be discharged from PT if patient refuses treatment 3 consecutive times without medical reason, if treatment goals not met, if there is a change in medical status, if patient makes no progress towards goals or if patient is discharged from hospital.  The above assessment, treatment plan, treatment alternatives and goals were discussed and mutually agreed upon: by patient  Juanna Cao, PT, DPT  07/05/2014, 12:24 PM

## 2014-07-05 NOTE — Progress Notes (Signed)
Physical Therapy Session Note  Patient Details  Name: Christopher [name] MRN: [id number] Date of Birth: March 13, 1926  Today's Date: 07/05/2014 PT Individual Time: 1300-1400 PT Individual Time Calculation (min): 60 min   Short Term Goals: Week 1:  PT Short Term Goal 1 (Week 1): Pt will perform functional transfers with min A PT Short Term Goal 2 (Week 1): Pt will be able to gait x 50' with min A PT Short Term Goal 3 (Week 1): Pt will be able to go up/down 2 steps with L rail for home entry with min A  Skilled Therapeutic Interventions/Progress Updates:   Pt presents in recliner with reported need to use bathroom. Mod A for sit to stand from recliner and min A gait (with mod verbal cues for management of RW as he was keeping it too far in front of patient) to walk into bathroom. Pt unable to hold urine in time to get clothing down and transfer to toilet and was incontinent. Removed clothing while seated on toilet and pt washed peri and legs with washcloth with set-up assist. Performed sit to stands from toilet/BSC with min A and returned back to recliner. Pt becoming tired and unable to make it all the way to the bed, so rested in recliner and then transferred back to bed. Pt fatigued but agreeable to work on supine therex for RLE. Performed 15 reps each of AAROM for hip flexion, SAQ, hip abduction, and quad sets.   Therapy Documentation Precautions:  Precautions Precautions: Fall Restrictions Weight Bearing Restrictions: Yes RLE Weight Bearing: Weight bearing as tolerated  Pain:  c/o R hip pain - premedicated. Positioned in supine end of session to rest.   See FIM for current functional status  Therapy/Group: Individual Therapy  Canary Brim Ivory Broad, PT, DPT  07/05/2014, 2:11 PM

## 2014-07-05 NOTE — Evaluation (Signed)
Occupational Therapy Assessment and Plan  Patient Details  Name: Christopher [name] MRN: [id number] Date of Birth: 02/18/1927  OT Diagnosis: abnormal posture, acute pain, muscular wasting and disuse atrophy, muscle weakness (generalized) and pain in joint Rehab Potential: Rehab Potential (ACUTE ONLY): Excellent ELOS: 10-14 days   Today's Date: 07/05/2014 OT Individual Time:  1000- 1130  Total Time: 90 minutes     Problem List:  Patient Active Problem List   Diagnosis Date Noted  . Fracture of femoral neck, right   . Intertrochanteric fracture of left hip   . Closed right hip fracture 06/28/2014  . CHF (congestive heart failure) 06/28/2014  . Hip fracture 06/28/2014  . Fall   . Acute diastolic congestive heart failure, NYHA class 2 07/24/2013  . Acute respiratory failure 07/24/2013  . DM type 2 causing renal disease 07/24/2013  . Shortness of breath 07/24/2013  . Malignant neoplasm of prostate 07/16/2013  . History of amiodarone therapy 07/16/2012  . Atrial fibrillation 08/07/2011  . Lumbar disc disease   . Hyperlipidemia   . Hypertensive heart disease without CHF   . Kidney disease, chronic, stage III (GFR 30-59 ml/min)   . Long-term (current) use of anticoagulants   . BPH (benign prostatic hyperplasia)   . Cardiac pacemaker in situ   . CAD (coronary artery disease) 03/06/2010    Past Medical History:  Past Medical History  Diagnosis Date  . Lumbar disc disease   . BPH (benign prostatic hyperplasia)   . HTN (hypertension)   . Diabetes mellitus type II   . Sinus node dysfunction     symptomatic bradycardia  . Pacemaker     Medtronic-dual-chamber-DOI 2012  . CAD (coronary artery disease)     followed by Dr. Wynonia Lawman  . Arthritis   . History of skin cancer   . Frequency of urination   . Nocturia   . Chronic kidney disease     kidneys function at "20 %" followed by Dr. Mercy Moore  . Cancer     history of skin cancer  . Dysrhythmia     a fib  . Prostate cancer   .  Ruptured disk 1992  . Skin cancer    Past Surgical History:  Past Surgical History  Procedure Laterality Date  . Coronary artery bypass graft  2003    x3  . Cardiac catheterization    . Lumbar laminectomy  11/01  . Cardioversion  08/08/2011    Procedure: CARDIOVERSION;  Surgeon: Jacolyn Reedy, MD;  Location: [facility];  Service: Cardiovascular;  Laterality: N/A;  . Cardioversion N/A 07/16/2012    Procedure: CARDIOVERSION;  Surgeon: Jacolyn Reedy, MD;  Location: [place];  Service: Cardiovascular;  Laterality: N/A;  . Pacemaker insertion  2012  . Cataracts removed    . Cystoscopy with biopsy N/A 06/21/2013    Procedure: CYSTOSCOPY WITH BIOPSY;  Surgeon: Molli Hazard, MD;  Location: [facility];  Service: Urology;  Laterality: N/A;    TUR RESECTION OF POLYP BILATERAL RETROGRADE PYELOGRAM BLADDER BIOPSY    . Prostate biopsy N/A 06/21/2013    Procedure: BIOPSY TRANSRECTAL ULTRASONIC PROSTATE (TUBP);  Surgeon: Molli Hazard, MD;  [facility];  Service: Urology;  Laterality: N/A;  PROSTATE NERVE BLOCK  . Appendectomy  1980  . Hip pinning,cannulated Right 06/30/2014    Procedure: CANNULATED HIP PINNING;  Surgeon: Renette Butters, MD;  Location: [place];  Service: Orthopedics;  Laterality: Right;    Assessment & Plan Clinical Impression: Christopher [name]  is a 81 y.o. male with H/o HTN, DM type 2, CAD with CABG/PPM, CKD- baseline Cr- 3.62, A Fib-chronic Coumadin; who was admitted on 06/28/2014 after a fall. No LOC but he reported some lightheadedness when he stood from the sitting position with subsequent fall onto his right hip. Cranial CT scan was negative. X-rays of right hip showed a nondisplaced femoral neck fracture. He underwent cannulated hip pinning 06/30/2014 per Dr. Edmonia Lynch the same day and is WBAT RLE. Fall likely due to orthostatic symptoms and diuretics were held briefly. Reactive leucocytosis is resolving with decrease in WBC - 9.9, ABLA on chronic anemia  is being monitored with hgb - 9.0. He continues on Lovenox bridge to Coumadin as INR subtherapeutic at 1.25. Therapy ongoing and CIR was recommended by MD and rehab team.   Patient currently requires max with basic self-care skills secondary to muscle weakness and muscle joint tightness, decreased cardiorespiratoy endurance and decreased standing balance and decreased postural control.  Prior to hospitalization, patient could complete ADLs and IADLs with min.  Patient will benefit from skilled intervention to decrease level of assist with basic self-care skills, increase independence with basic self-care skills and increase level of independence with iADL prior to discharge home with care partner.  Anticipate patient will require 24 hour supervision and follow up home health.  OT - End of Session Endurance Deficit: Yes   Skilled Therapeutic Intervention Pt seen for OT eval and bathing and dressing session. Pt in supine upon arrival, fatigued from PT session, but agreeable to tx. Pt transferred supine> EOB with mod A. He tolerated sitting EOB unsupported to complete bathing and dressing. He required assist to wash LEs due to decreased functional movement of LEs secondary to pain. Pt stood from EOB to RW with mod A to pull pants up/down with steadying assist. Pt requested need for toileting task. He ambulated to bathroom with min A and max VCs and tactile cues for RW management. Pt completed toilet transfers with mod A, however, was unable to pull pants down before BM occurred. Pt voiced feeling extremely uncomfortable during BM. He stood from toilet with min A and was total A for hygiene clothing management. Pt ambulated back to bed with same assist level as described above. Pt returned to supine with assist for B LEs. Pt left in supine at end of session, RN present.    Education provided regarding role of OT, POC, modified dressing techniques, energy conservation, and d/c planning.   OT  Evaluation Precautions/Restrictions  Precautions Precautions: Fall Restrictions Weight Bearing Restrictions: Yes RLE Weight Bearing: Weight bearing as tolerated Pain Pain Assessment Pain Assessment: 0-10 Pain Score: 5  Pain Location: Hip Pain Orientation: Right Pain Descriptors / Indicators: Aching Pain Intervention(s): Repositioned;Ambulation/increased activity Home Living/Prior Functioning Home Living Family/patient expects to be discharged to:: Private residence Living Arrangements: Spouse/significant other Available Help at Discharge: Family, Available 24 hours/day Type of Home: House Home Access: Stairs to enter [employer] of Steps: 2 Entrance Stairs-Rails: Left Home Layout: One level  Lives With: Spouse Prior Function Level of Independence: Requires assistive device for independence, Independent with basic ADLs  Able to Take Stairs?: Yes Driving: Yes Comments: Using SPC for mobility due to back pain last seveal months Vision/Perception  Vision- History Baseline Vision/History: Wears glasses Wears Glasses: At all times Patient Visual Report: No change from baseline  Cognition Overall Cognitive Status: Within Functional Limits for tasks assessed Orientation Level: Oriented X4 Memory:  (Pt reports issues with memory since hosptial admission)  Safety/Judgment: Appears intact Sensation Sensation Light Touch: Appears Intact Proprioception: Appears Intact Coordination Gross Motor Movements are Fluid and Coordinated: No (RLE limited due to post op pain and weakness) Motor  Motor Motor: Within Functional Limits Motor - Skilled Clinical Observations: post op pain and weakness in RLE Mobility  Bed Mobility Bed Mobility: Sit to Supine;Supine to Sit Supine to Sit: 3: Mod assist Sit to Supine: 2: Max assist Sit to Supine - Details: Manual facilitation for placement;Verbal cues for precautions/safety;Verbal cues for technique Sit to Supine - Details  (indicate cue type and reason): Assist for B LEs and VCs for technique  Trunk/Postural Assessment  Cervical Assessment Cervical Assessment: Within Functional Limits (forward head) Thoracic Assessment Thoracic Assessment: Exceptions to [facility] (flexed posture) Lumbar Assessment Lumbar Assessment: Exceptions to [facility] (posterior pelvic tilt) Postural Control Postural Control: Within Functional Limits  Balance Balance Balance Assessed: Yes Static Sitting Balance Static Sitting - Level of Assistance: 5: Stand by assistance Dynamic Sitting Balance Dynamic Sitting - Level of Assistance: 4: Min assist;5: Stand by assistance Static Standing Balance Static Standing - Level of Assistance: 4: Min assist Dynamic Standing Balance Dynamic Standing - Level of Assistance: 4: Min assist Extremity/Trunk Assessment RUE Assessment RUE Assessment: Exceptions to [facility] (Appears WFL, however, unable to fully assist due to R arm wound. Grip strength 4+/5) LUE Assessment LUE Assessment: Within Functional Limits  FIM:  FIM - Bathing Bathing Steps Patient Completed: Chest;Right upper leg;Left upper leg;Right Arm;Left Arm;Abdomen;Front perineal area Bathing: 3: Mod-Patient completes 5-7 [id number] parts or 50-74% FIM - Upper Body Dressing/Undressing Upper body dressing/undressing steps patient completed: Thread/unthread right sleeve of pullover shirt/dresss;Thread/unthread left sleeve of pullover shirt/dress;Put head through opening of pull over shirt/dress Upper body dressing/undressing: 4: Min-Patient completed 75 plus % of tasks FIM - Lower Body Dressing/Undressing Lower body dressing/undressing steps patient completed: Thread/unthread left underwear leg;Pull underwear up/down;Pull pants up/down Lower body dressing/undressing: 2: Max-Patient completed 25-49% of tasks FIM - TMusicianDevices: Grab bar or rail for support Toileting: 1: Total-Patient completed zero steps, helper did all 3 FIM -  BControl and instrumentation engineerDevices: WAdult nurseTransfer: 3: Supine > Sit: Mod A (lifting assist/Pt. 50-74%/lift 2 legs;3: Sit > Supine: Mod A (lifting assist/Pt. 50-74%/lift 2 legs);3: Bed > Chair or W/C: Mod A (lift or lower assist) FIM - TRadio producerDevices: Bedside commode;Walker Toilet Transfers: 3-To toilet/BSC: Mod A (lift or lower assist);3-From toilet/BSC: Mod A (lift or lower assist)   Refer to Care Plan for Long Term Goals  Recommendations for other services: None  Discharge Criteria: Patient will be discharged from OT if patient refuses treatment 3 consecutive times without medical reason, if treatment goals not met, if there is a change in medical status, if patient makes no progress towards goals or if patient is discharged from hospital.  The above assessment, treatment plan, treatment alternatives and goals were discussed and mutually agreed upon: by patient  [name], Leen Tworek C 07/05/2014, 11:09 AM

## 2014-07-05 NOTE — Progress Notes (Signed)
Charlett Blake, MD Physician Signed Physical Medicine and Rehabilitation Consult Note 07/01/2014 2:36 PM  Related encounter: ED to Hosp-Admission (Discharged) from 06/28/2014 in [facility] Collapse All        Physical Medicine and Rehabilitation Consult Reason for Consult: Right hip fracture Referring Physician: [facility]   HPI: Christopher [name] is a 81 y.o. right handed male with history of hypertension, diabetes mellitus and peripheral neuropathy, CAD with CABG/pacemaker, chronic kidney disease with baseline creatinine 3.62 and atrial fibrillation with chronic Coumadin therapy. Patient lives with his wife independent with a cane prior to admission. Admitted 06/28/2014 after a fall without loss of consciousness. He did note some lightheadedness when he stood from the sitting position following forward on his right hip. Cranial CT scan was negative. X-rays of right hip showed a nondisplaced femoral neck fracture. INR on admission of 1.66. Underwent cannulated hip pinning 06/30/2014 per Dr. Percell Miller. Weightbearing as tolerated right lower extremity. Chronic Coumadin has been resumed. Hospital course pain management. Acute blood loss anemia 9.3 and monitored. Physical therapy evaluation completed with recommendations of physical medicine rehabilitation consult.  Patient is awake sitting in bed conversant denies pain at the current time Review of Systems  Cardiovascular: Positive for palpitations.  Gastrointestinal: Positive for constipation.  Genitourinary: Positive for urgency.  Musculoskeletal: Positive for back pain.  All other systems reviewed and are negative.  Past Medical History  Diagnosis Date  . Lumbar disc disease   . BPH (benign prostatic hyperplasia)   . HTN (hypertension)   . Diabetes mellitus type II   . Sinus node dysfunction     symptomatic bradycardia  . Pacemaker     [employer]-dual-chamber-DOI  2012  . CAD (coronary artery disease)     followed by Dr. Wynonia Lawman  . Arthritis   . History of skin cancer   . Frequency of urination   . Nocturia   . Chronic kidney disease     kidneys function at "20 %" followed by Dr. Mercy Moore  . Cancer     history of skin cancer  . Dysrhythmia     a fib  . Prostate cancer   . Ruptured disk 1992  . Skin cancer    Past Surgical History  Procedure Laterality Date  . Coronary artery bypass graft  2003    x3  . Cardiac catheterization    . Lumbar laminectomy  11/01  . Cardioversion  08/08/2011    Procedure: CARDIOVERSION; Surgeon: Jacolyn Reedy, MD; Location: [place]; Service: Cardiovascular; Laterality: N/A;  . Cardioversion N/A 07/16/2012    Procedure: CARDIOVERSION; Surgeon: Jacolyn Reedy, MD; Location: [place]; Service: Cardiovascular; Laterality: N/A;  . Pacemaker insertion  2012  . Cataracts removed    . Cystoscopy with biopsy N/A 06/21/2013    Procedure: CYSTOSCOPY WITH BIOPSY; Surgeon: Molli Hazard, MD; Location: [facility]; Service: Urology; Laterality: N/A;   TUR RESECTION OF POLYP BILATERAL RETROGRADE PYELOGRAM BLADDER BIOPSY    . Prostate biopsy N/A 06/21/2013    Procedure: BIOPSY TRANSRECTAL ULTRASONIC PROSTATE (TUBP); Surgeon: Molli Hazard, MD; Location: [facility]; Service: Urology; Laterality: N/A; PROSTATE NERVE BLOCK  . Appendectomy  1980  . Hip pinning,cannulated Right 06/30/2014    Procedure: CANNULATED HIP PINNING; Surgeon: Renette Butters, MD; Location: [place]; Service: Orthopedics; Laterality: Right;   Family History  Problem Relation Age of Onset  . Cancer Mother     breast  . Cancer Father  prostate, esophagus   Social History:  reports that he quit smoking about 46 years ago. His smoking use included Cigarettes. He smoked 0.75 packs per day. He  has never used smokeless tobacco. He reports that he does not drink alcohol or use illicit drugs. Allergies:  Allergies  Allergen Reactions  . Crestor [Rosuvastatin] Other (See Comments)    Aches in joint   . Lipitor [Atorvastatin] Other (See Comments)    Joint aching  . Penicillins Rash  . Tramadol Nausea Only  . Vicodin [Hydrocodone-Acetaminophen] Anxiety    "Made me nervous"   Medications Prior to Admission  Medication Sig Dispense Refill  . amiodarone (PACERONE) 200 MG tablet Take 200 mg by mouth every morning.    Marland Kitchen atenolol (TENORMIN) 25 MG tablet Take 25 mg by mouth every morning.     . finasteride (PROSCAR) 5 MG tablet Take 5 mg by mouth daily.     . folic acid (FOLVITE) 458 MCG tablet Take 400 mcg by mouth daily.     . furosemide (LASIX) 40 MG tablet Take 1.5 tablets (60 mg total) by mouth 2 (two) times daily. (Patient taking differently: Take 40 mg by mouth 2 (two) times daily. ) 90 tablet 1  . glimepiride (AMARYL) 1 MG tablet Take 1 mg by mouth daily before breakfast.     . Glucosamine HCl 1500 MG TABS Take 1,500 mg by mouth 2 (two) times daily.     . Horse Chestnut 300 MG CAPS Take 1 capsule by mouth daily.     . Multiple Vitamin (MULTIVITAMIN WITH MINERALS) TABS tablet Take 1 tablet by mouth daily.    . Omega-3 Fatty Acids (FISH OIL) 1200 MG CAPS Take 1,200 mg by mouth daily.    Marland Kitchen warfarin (COUMADIN) 4 MG tablet Take 4 mg by mouth every evening.    . hyoscyamine (LEVSIN, ANASPAZ) 0.125 MG tablet Take 1 tablet (0.125 mg total) by mouth every 4 (four) hours as needed (bladder spasms). 40 tablet 4    Home: Home Living Family/patient expects to be discharged to:: Private residence Living Arrangements: Spouse/significant other Available Help at Discharge: Family, Available 24 hours/day Type of Home: House Home Access: Stairs to enter Technical brewer of Steps: 2 Entrance  Stairs-Rails: None Home Layout: One level Home Equipment: Cane - single point, Shower seat Additional Comments: tub shower  Functional History: Prior Function Level of Independence: Independent with assistive device(s) Functional Status:  Mobility: Bed Mobility Overal bed mobility: Needs Assistance Bed Mobility: Supine to Sit, Sit to Supine Supine to sit: Mod assist, Max assist, HOB elevated Sit to supine: +2 for physical assistance, Mod assist General bed mobility comments: assist to move feet off bed and lift to upright and scoot to edge of bed Transfers Overall transfer level: Needs assistance Equipment used: Rolling walker (2 wheeled) Transfers: Stand Pivot Transfers Sit to Stand: From elevated surface, Mod assist Stand pivot transfers: Mod assist General transfer comment: increased time to rise, pt requested to try with hands on walker due to unsuccessful with hands on bed; so held walker for safety; pivotal steps to chair with walker, cues for sequence, weight on walker, etc Ambulation/Gait Ambulation/Gait assistance: Mod assist Ambulation Distance (Feet): 1 Feet Assistive device: Rolling walker (2 wheeled) General Gait Details: side steps up to head of bed x 3; cues for weight on hands with walker    ADL:    Cognition: Cognition Overall Cognitive Status: Within Functional Limits for tasks assessed Orientation Level: Oriented X4 Cognition Arousal/Alertness: Lethargic, Suspect  due to medications Behavior During Therapy: [facility] [facility] for tasks assessed/performed Overall Cognitive Status: Within Functional Limits for tasks assessed  Blood pressure 110/61, pulse 63, temperature 98.8 F (37.1 C), temperature source Oral, resp. rate 16, height 6\' 2"  (1.88 m), weight 111.54 kg (245 lb 14.4 oz), SpO2 100 %. Physical Exam  Vitals reviewed. Constitutional: He is oriented to person, place, and time. He appears well-developed.  HENT:  Head: Normocephalic.  Eyes: EOM are normal.    Neck: Normal range of motion. Neck supple. No thyromegaly present.  Cardiovascular:  Cardiac rate controlled  Respiratory: Effort normal and breath sounds normal. No respiratory distress.  GI:  Mildly distended and non tender with diminished bowel sounds  Neurological: He is alert and oriented to person, place, and time.  Skin:  Hip incision clean and dry  Musculoskeletal right knee contusion with tenderness as well as Ecchymosis Right Hip and knee pain with lower extremity range of motion no pain with ankle range of motion No pain with left lower extremity range of motion Upper extremity strength 5/5 bilateral deltoids, biceps, triceps, grip Left lower extremity 4/5 and hip flexor and extensor ankle dorsal flexion plantar flexion Trace right hip flexion knee extension 4 minus ankle dorsiflexion plantarflexion    Lab Results Last 24 Hours    Results for orders placed or performed during the hospital encounter of 06/28/14 (from the past 24 hour(s))  Glucose, capillary Status: Abnormal   Collection Time: 06/30/14 4:42 PM  Result Value Ref Range   Glucose-Capillary 143 (H) 70 - 99 mg/dL   Comment 1 Repeat Test    Comment 2 Document in Chart   Glucose, capillary Status: Abnormal   Collection Time: 06/30/14 9:05 PM  Result Value Ref Range   Glucose-Capillary 149 (H) 70 - 99 mg/dL  Glucose, capillary Status: Abnormal   Collection Time: 07/01/14 6:29 AM  Result Value Ref Range   Glucose-Capillary 136 (H) 70 - 99 mg/dL  Protime-INR Status: Abnormal   Collection Time: 07/01/14 6:50 AM  Result Value Ref Range   Prothrombin Time 16.8 (H) 11.6 - 15.2 seconds   INR 1.35 0.00 - 1.49  CBC with Differential/Platelet Status: Abnormal   Collection Time: 07/01/14 8:46 AM  Result Value Ref Range   WBC 11.4 (H) 4.0 - 10.5 K/uL   RBC 3.27 (L) 4.22 - 5.81 MIL/uL   Hemoglobin 9.3 (L) 13.0 - 17.0  g/dL   HCT 30.1 (L) 39.0 - 52.0 %   MCV 92.0 78.0 - 100.0 fL   MCH 28.4 26.0 - 34.0 pg   MCHC 30.9 30.0 - 36.0 g/dL   RDW 16.8 (H) 11.5 - 15.5 %   Platelets 294 150 - 400 K/uL   Neutrophils Relative % 85 (H) 43 - 77 %   Neutro Abs 9.8 (H) 1.7 - 7.7 K/uL   Lymphocytes Relative 8 (L) 12 - 46 %   Lymphs Abs 0.9 0.7 - 4.0 K/uL   Monocytes Relative 6 3 - 12 %   Monocytes Absolute 0.7 0.1 - 1.0 K/uL   Eosinophils Relative 1 0 - 5 %   Eosinophils Absolute 0.1 0.0 - 0.7 K/uL   Basophils Relative 0 0 - 1 %   Basophils Absolute 0.0 0.0 - 0.1 K/uL  Basic metabolic panel Status: Abnormal   Collection Time: 07/01/14 8:46 AM  Result Value Ref Range   Sodium 137 135 - 145 mmol/L   Potassium 5.3 (H) 3.5 - 5.1 mmol/L   Chloride 105 96 - 112 mmol/L  CO2 23 19 - 32 mmol/L   Glucose, Bld 135 (H) 70 - 99 mg/dL   BUN 62 (H) 6 - 23 mg/dL   Creatinine, Ser 3.14 (H) 0.50 - 1.35 mg/dL   Calcium 8.5 8.4 - 10.5 mg/dL   GFR calc non Af Amer 17 (L) >90 mL/min   GFR calc Af Amer 19 (L) >90 mL/min   Anion gap 9 5 - 15  Glucose, capillary Status: Abnormal   Collection Time: 07/01/14 11:18 AM  Result Value Ref Range   Glucose-Capillary 160 (H) 70 - 99 mg/dL   Comment 1 Repeat Test    Comment 2 Document in Chart       Imaging Results (Last 48 hours)    Pelvis Portable  06/30/2014 CLINICAL DATA: Status post ORIF of the fracture of the right femoral neck EXAM: PORTABLE PELVIS 1-2 VIEWS COMPARISON: Preoperative exam of June 28, 2014 FINDINGS: The patient has undergone placement of 3 screws for fixation of the right femoral neck fracture. Alignment of the fracture is near anatomic IMPRESSION: ORIF for right femoral neck fracture without evidence of immediate postprocedure complication. Electronically Signed By: David Martinique M.D. On: 06/30/2014 13:26   Dg  Hip Operative Unilat With Pelvis Right  06/30/2014 CLINICAL DATA: Femoral neck fracture. EXAM: OPERATIVE RIGHT HIP (WITH PELVIS IF PERFORMED) VIEWS TECHNIQUE: Fluoroscopic spot image(s) were submitted for interpretation post-operatively. FLUOROSCOPY TIME: Fluoroscopy Time: 0 minutes 45 seconds Number of Acquired Images: 2 images COMPARISON: None. FINDINGS: Patient status post right femoral neck pinning. Good anatomic alignment. Hardware intact. IMPRESSION: ORIF right hip. Electronically Signed By: Marcello Moores Register On: 06/30/2014 13:05     Assessment/Plan: Diagnosis: Right femoral neck fracture 1. Does the need for close, 24 hr/day medical supervision in concert with the patient's rehab needs make it unreasonable for this patient to be served in a less intensive setting? Yes 2. Co-Morbidities requiring supervision/potential complications: Coronary artery disease, atrial fibrillation status post permanent pacemaker, chronic kidney disease stage III, diabetes mellitus with renal disease,  3. Due to bladder management, bowel management, safety, skin/wound care, disease management, medication administration, pain management and patient education, does the patient require 24 hr/day rehab nursing? Yes 4. Does the patient require coordinated care of a physician, rehab nurse, PT (1-2 hrs/day, 5 days/week) and OT (1-2 hrs/day, 5 days/week) to address physical and functional deficits in the context of the above medical diagnosis(es)? Yes Addressing deficits in the following areas: balance, endurance, locomotion, strength, transferring, bowel/bladder control, bathing, dressing, feeding, grooming and toileting 5. Can the patient actively participate in an intensive therapy program of at least 3 hrs of therapy per day at least 5 days per week? Yes 6. The potential for patient to make measurable gains while on inpatient rehab is excellent 7. Anticipated functional outcomes upon discharge from  inpatient rehab are modified independent with PT, modified independent with OT, n/a with SLP. 8. Estimated rehab length of stay to reach the above functional goals is: 7-10 days 9. Does the patient have adequate social supports and living environment to accommodate these discharge functional goals? Yes 10. Anticipated D/C setting: Home 11. Anticipated post D/C treatments: [facility] therapy 12. Overall Rehab/Functional Prognosis: excellent  RECOMMENDATIONS: This patient's condition is appropriate for continued rehabilitative care in the following setting: CIR Patient has agreed to participate in recommended program. Yes Note that insurance prior authorization may be required for reimbursement for recommended care.  Comment: Postop day #1, rehabilitation Admissions coordinator to follow progress    07/01/2014  Revision History     Date/Time User Provider Type Action   07/01/2014 3:42 PM Charlett Blake, MD Physician Sign   07/01/2014 2:48 PM Cathlyn Parsons, PA-C Physician Assistant [place]   View Details Report       Routing History     Date/Time From To Method   07/01/2014 3:42 PM Charlett Blake, MD Charlett Blake, MD In [facility]   07/01/2014 3:42 PM Charlett Blake, MD Prince Solian, MD Fax

## 2014-07-06 ENCOUNTER — Inpatient Hospital Stay (HOSPITAL_COMMUNITY): Payer: Medicare Other | Admitting: Physical Therapy

## 2014-07-06 ENCOUNTER — Inpatient Hospital Stay (HOSPITAL_COMMUNITY): Payer: Medicare Other | Admitting: Occupational Therapy

## 2014-07-06 ENCOUNTER — Inpatient Hospital Stay (HOSPITAL_COMMUNITY): Payer: Medicare Other | Admitting: *Deleted

## 2014-07-06 LAB — GLUCOSE, CAPILLARY
GLUCOSE-CAPILLARY: 106 mg/dL — AB (ref 70–99)
GLUCOSE-CAPILLARY: 117 mg/dL — AB (ref 70–99)
GLUCOSE-CAPILLARY: 118 mg/dL — AB (ref 70–99)
Glucose-Capillary: 87 mg/dL (ref 70–99)
Glucose-Capillary: 106 mg/dL — ABNORMAL HIGH (ref 70–99)

## 2014-07-06 LAB — PROTIME-INR
INR: 1.68 — ABNORMAL HIGH (ref 0.00–1.49)
Prothrombin Time: 20 seconds — ABNORMAL HIGH (ref 11.6–15.2)

## 2014-07-06 MED ORDER — WARFARIN SODIUM 5 MG PO TABS
5.0000 mg | ORAL_TABLET | Freq: Once | ORAL | Status: AC
  Administered 2014-07-06: 5 mg via ORAL
  Filled 2014-07-06: qty 1

## 2014-07-06 NOTE — Progress Notes (Signed)
[facility] PHYSICAL MEDICINE & REHABILITATION     PROGRESS NOTE    Subjective/Complaints: Had a much better night. Still having some right hip pain. Wife doing well this am. Up in room. Didn'[name] stay home overnight!!  Objective: Vital Signs: Blood pressure 130/56, pulse 63, temperature 98.1 F (36.7 C), temperature source Oral, resp. rate 17, weight 113.1 kg (249 lb 5.4 oz), SpO2 95 %. No results found.  Recent Labs  07/04/14 0441 07/05/14 1231  WBC 9.9 13.6*  HGB 9.0* 9.9*  HCT 29.3* 31.8*  PLT 335 402*    Recent Labs  07/04/14 0441 07/05/14 1231  NA 139 139  K 5.2* 5.2*  CL 105 106  GLUCOSE 149* 130*  BUN 86* 83*  CREATININE 3.72* 3.05*  CALCIUM 8.6 8.9   CBG (last 3)   Recent Labs  07/05/14 1135 07/05/14 2317 07/06/14 0702  GLUCAP 134* 137* 117*    Wt Readings from Last 3 Encounters:  07/05/14 113.1 kg (249 lb 5.4 oz)  07/04/14 112.7 kg (248 lb 7.3 oz)  07/28/13 101.6 kg (223 lb 15.8 oz)    Physical Exam:  Constitutional: He is oriented to person, place, and time. He appears well-developed and well-nourished.  HENT:  Head: Normocephalic and atraumatic.  Right Ear: External ear normal.  Left Ear: External ear normal.  Eyes: Conjunctivae are normal. Pupils are equal, round, and reactive to light.  Neck: Normal range of motion. Neck supple.  Cardiovascular: Normal rate and regular rhythm. Exam reveals no gallop and no friction rub.  No murmur heard. Respiratory: Effort normal and breath sounds normal. No respiratory distress. He has no wheezes. He has no rales.  GI: Soft. He exhibits distension. Bowel sounds are decreased. There is no tenderness.  Musculoskeletal:  Min edema right hip and thigh. Small incisions which are intact, covered with foam dressing. right thigh tender. Neurological: He is alert and oriented to person, place, and time. No cranial nerve deficit. Coordination normal.  Right Hip and knee pain with lower extremity range of  motion no pain with ankle range of motion No pain with left lower extremity range of motion Upper extremity strength 5/5 bilateral deltoids, biceps, triceps, grip Left lower extremity 4/5 and hip flexor and extensor ankle dorsal flexion plantar flexion Trace right hip flexion knee extension 4 minus ankle dorsiflexion plantarflexion Skin: Skin is warm and dry. Incision clean. Right forearm tear clean.Psychiatric: He has a normal mood and affect. His behavior is normal.     Assessment/Plan: 1. Functional deficits secondary to right femoral neck fracture which require 3+ hours per day of interdisciplinary therapy in a comprehensive inpatient rehab setting. Physiatrist is providing close team supervision and 24 hour management of active medical problems listed below. Physiatrist and rehab team continue to assess barriers to discharge/monitor patient progress toward functional and medical goals. FIM: FIM - Bathing Bathing Steps Patient Completed: Chest, Right upper leg, Left upper leg, Right Arm, Left Arm, Abdomen, Front perineal area Bathing: 3: Mod-Patient completes 5-7 19f 10 parts or 50-74%  FIM - Upper Body Dressing/Undressing Upper body dressing/undressing steps patient completed: Thread/unthread right sleeve of pullover shirt/dresss, Thread/unthread left sleeve of pullover shirt/dress, Put head through opening of pull over shirt/dress Upper body dressing/undressing: 4: Min-Patient completed 75 plus % of tasks FIM - Lower Body Dressing/Undressing Lower body dressing/undressing steps patient completed: Thread/unthread left underwear leg, Pull underwear up/down, Pull pants up/down Lower body dressing/undressing: 2: Max-Patient completed 25-49% of tasks  FIM - Musician Devices: Grab bar  or rail for support Toileting: 1: Total-Patient completed zero steps, helper did all 3  FIM - Radio producer Devices: Engineer, civil (consulting), Insurance account manager  Transfers: 3-To toilet/BSC: Mod A (lift or lower assist), 3-From toilet/BSC: Mod A (lift or lower assist)  FIM - Control and instrumentation engineer Devices: Adult nurse Transfer: 3: Supine > Sit: Mod A (lifting assist/Pt. 50-74%/lift 2 legs, 3: Sit > Supine: Mod A (lifting assist/Pt. 50-74%/lift 2 legs), 3: Bed > Chair or W/C: Mod A (lift or lower assist)  FIM - Locomotion: Wheelchair Locomotion: Wheelchair: 1: Travels less than 50 ft with minimal assistance (Pt.>75%) FIM - Locomotion: Ambulation Locomotion: Ambulation Assistive Devices: Walker - Rolling Locomotion: Ambulation: 1: Travels less than 50 ft with minimal assistance (Pt.>75%)  Comprehension Comprehension Mode: Auditory Comprehension: 5-Follows basic conversation/direction: With no assist  Expression Expression Mode: Verbal Expression: 5-Expresses basic needs/ideas: With no assist  Social Interaction Social Interaction: 6-Interacts appropriately with others with medication or extra time (anti-anxiety, antidepressant).  Problem Solving Problem Solving: 5-Solves basic problems: With no assist  Memory Memory: 6-More than reasonable amt of time   Medical Problem List and Plan: 1. Functional deficits secondary to right femoral neck fracture after fall s/p pinning 2. DVT Prophylaxis/Anticoagulation: Pharmaceutical: Lovenox until coumadin therapeutic 3. Pain Management:   Will use oxycodone prn. 4. Mood: Motivated to get better. LCSW to follow for evaluation and support.  5. Neuropsych: This patient is capable of making decisions on his own behalf. 6. Skin/Wound Care: Routine pressure relief measures. local care.  7. Fluids/Electrolytes/Nutrition: Monitor I/O. Encourage patient to push fluids.  8. DM type 2:   Will monitor BS with ac/hs checks. Resumed amaryl   -sugars reasonable at present 9. A fib: Will monitor HR every 8 hours. Continue amiodarone and atenolol for rate control.  10.  Constipation:   results with supp and enema 11. HTN: Monitor BP every 8 hours. Continue lasix 40 mg daily as well as porscar and tenormin. 12. Acute on chronic anemia:   iron supplement.   LOS (Days) 2 A FACE TO FACE EVALUATION WAS PERFORMED  Christopher [name] 07/06/2014 8:25 AM

## 2014-07-06 NOTE — Progress Notes (Signed)
Occupational Therapy Session Note  Patient Details  Name: Christopher [name] MRN: [id number] Date of Birth: 09-22-1926  Today's Date: 07/06/2014 OT Individual Time: 9381-0175 OT Individual Time Calculation (min): 60 min    Short Term Goals: Week 1:  OT Short Term Goal 1 (Week 1): Pt will complete toilet transfers with min A OT Short Term Goal 2 (Week 1): Pt will complete LB dressing with min A OT Short Term Goal 3 (Week 1): Pt will tolerate 5 minutes of standing with min A to complete grooming task OT Short Term Goal 4 (Week 1): Pt will complete toileting task with mod A  Skilled Therapeutic Interventions/Progress Updates:    Pt seen for OT ADL bathing and dressing session. Pt sitting up in w/c upon arrival, agreeable to tx, and wife present for session. Pt completed UB and LB bathing and dressing sitting in w/c at the sink. He washed UB with set-up and required min A and increased time for LB bathing. Pt stood at the sink with steadying assist to complete buttock hygiene and when pull pants up. Pt able to thread L LE into pants and required min support of R LE to thread R leg which he stated was his baseline level of performance due to decreased ability to lift R leg up. Pt transferred w/c> EOB with min-mod A via stand pivot transfer. B Shoes and socks donned sitting EOB, as pt able to bring L LE onto bed for support. Pt unable to bring R LE fully onto bed, and required assist to thread R sock and shoe onto foot. Attempt made to have pt support R LE onto chair, however, pt was unable to reach R foot when in this position, and assist was provided to don and fasten R shoe.    Pt then taken to ADL apartment total Ain w/c for time. He ambulated within ADL apartment with RW and VCs for RW management. He completed shower transfer via tub transfer bench with supervision and cues for technique. Pt returned to w/c and self propelled w/c ~25 yards and then taken back to room total A. Pt left sitting up in w/c at  end of session, all needs in reach.    Pt voiced 4/10 pain in R hip which increased slightly with activity. He voiced fatigue at beginning of session, however, this did not appear to limit his performance or participation in session.    Education provided regarding role of OT, POC, DME, energy conservation, importance of OOB, w/c management, modified dressing techniques, and d/c planning.   Therapy Documentation Precautions:  Precautions Precautions: Fall Precaution Comments: watch BP Restrictions Weight Bearing Restrictions: Yes RLE Weight Bearing: Weight bearing as tolerated Pain: Pain Assessment Pain Assessment: 0-10 Pain Score: 4  Pain Type: Surgical pain Pain Location: Hip Pain Orientation: Right Pain Descriptors / Indicators: Aching Pain Onset: On-going Pain Intervention(s): Repositioned; increased activity/ambulation  See FIM for current functional status  Therapy/Group: Individual Therapy  [name], [name] 07/06/2014, 7:23 AM

## 2014-07-06 NOTE — Progress Notes (Signed)
Physical Therapy Session Note  Patient Details  Name: Christopher [name] MRN: [id number] Date of Birth: October 30, 1926  Today's Date: 07/06/2014 PT Individual Time: 1100-1200 PT Individual Time Calculation (min): 60 min   Short Term Goals: Week 1:  PT Short Term Goal 1 (Week 1): Pt will perform functional transfers with min A PT Short Term Goal 2 (Week 1): Pt will be able to gait x 50' with min A PT Short Term Goal 3 (Week 1): Pt will be able to go up/down 2 steps with L rail for home entry with min A  Skilled Therapeutic Interventions/Progress Updates:    Therapeutic Exercise: PT instructs pt in gentle low back ROM exercises to decrease chronic LBP: slouch/overcorrect, side flexion, rotation x 10 reps each direction. Pt reports this decreases his LBP.   Gait Training:  PT instructs pt in ambulation with RW x 35' with verbal cues for upright posture and manual facilitation to increase weight shift R in stance req min A.  PT instructs pt in ascending/descneding stairs with B rails x 1 step + 2 steps + 3 steps with B rails req min A with verbal cues for sequencing (5" height stairs) and 1 seated rest break after 2 steps.   Therapeutic Activity: PT instructs pt in repeated sit to stand from w/c with RW x 3 reps req min A.  PT instructs pt in sit to stand from w/c, initially req mod A, progressing to min A during treatment with improved hand placement, using RW.   W/C Management: PT instructs pt in w/c propulsion with B UEs/LEs prn x 175' req grossly SBA with verbal cues for increased arm stroke, but req min A with steering through gym door so as to not scrape R arm.   Later during tx session, pt reports that chronic LBP involved radiating pain down R leg all the way to foot, at times, and that he had compensated with doing stairs up with the strong leg, prior to fall and hip fracture. PT notes that pt may have some discal/nerve involvement from low back, and may benefit from extension based lumbar  exercises. Pt is slowly progressing in activity tolerance and functional mobility. Continue per PT POC.    Therapy Documentation Precautions:  Precautions Precautions: Fall Precaution Comments: watch BP Restrictions Weight Bearing Restrictions: Yes RLE Weight Bearing: Weight bearing as tolerated Pain: Pain Assessment Pain Assessment: 0-10 Pain Score: 5  Pain Type: Surgical pain Pain Location: Hip Pain Orientation: Right Pain Descriptors / Indicators: Aching Pain Onset: On-going Pain Intervention(s): Rest;Emotional support Multiple Pain Sites: Yes 2nd Pain Site Pain Score: 7 Pain Type: Chronic pain Pain Location: Back Pain Orientation: Lower Pain Descriptors / Indicators: Aching Pain Onset: Gradual Pain Intervention(s): Other (Comment) (gentle rom)   See FIM for current functional status  Therapy/Group: Individual Therapy  Rhiannon Sassaman M 07/06/2014, 8:53 AM

## 2014-07-06 NOTE — Progress Notes (Signed)
Occupational Therapy Session Note  Patient Details  Name: Christopher [name] MRN: [id number] Date of Birth: 1926-11-06  Today's Date: 07/06/2014 OT Individual Time: 1003-1031 OT Individual Time Calculation (min): 28 min    Short Term Goals: Week 1:  OT Short Term Goal 1 (Week 1): Pt will complete toilet transfers with min A OT Short Term Goal 2 (Week 1): Pt will complete LB dressing with min A OT Short Term Goal 3 (Week 1): Pt will tolerate 5 minutes of standing with min A to complete grooming task OT Short Term Goal 4 (Week 1): Pt will complete toileting task with mod A  Skilled Therapeutic Interventions/Progress Updates:    Pt performed standing at the sink to begin session while working on grooming task of brushing his teeth.  He needed min assist for initial sit to stand from the wheelchair and then use the RW to ambulate to the sink.  Min guard assist for static standing while performing task.  Pt reporting most of his pain is in his sacral area where he has been dealing with it on and off for years.  Noted increased trunk flexion while standing to perform the grooming task.  Transferred back to the wheelchair.  Educated pt and wife on AE use for LB selfcare as pt reported difficulty for some time putting on socks and shoes.  Educated on used of a reacher, sockaid, and LH shoe horn but did not practice.  Pt will need wide sockaide based on his foot size.    Therapy Documentation Precautions:  Precautions Precautions: Fall Precaution Comments: watch BP Restrictions Weight Bearing Restrictions: Yes RLE Weight Bearing: Weight bearing as tolerated  Pain: Pain Assessment Pain Assessment: 0-10 Pain Score: 6  Pain Type: Surgical pain Pain Location: Hip Pain Orientation: Right Pain Descriptors / Indicators: Aching Pain Frequency: Intermittent Pain Onset: On-going Patients Stated Pain Goal: 3 Pain Intervention(s): Medication (See eMAR);Repositioned Multiple Pain Sites: No ADL: See FIM  for current functional status  Therapy/Group: Individual Therapy  Amijah Timothy,[place]ond OTR/L 07/06/2014, 3:33 PM

## 2014-07-06 NOTE — Progress Notes (Signed)
Occupational Therapy Session Note  Patient Details  Name: Christopher [name] MRN: [id number] Date of Birth: April 09, 1926  Today's Date: 07/06/2014 OT Individual Time: 1400-1500 OT Individual Time Calculation (min): 60 min    Short Term Goals: Week 1:  OT Short Term Goal 1 (Week 1): Pt will complete toilet transfers with min A OT Short Term Goal 2 (Week 1): Pt will complete LB dressing with min A OT Short Term Goal 3 (Week 1): Pt will tolerate 5 minutes of standing with min A to complete grooming task OT Short Term Goal 4 (Week 1): Pt will complete toileting task with mod A  Skilled Therapeutic Interventions/Progress Updates:    Pt seen for OT session with 30 minute co-tx with RT and wife present. Pt in supine upon arrival,stating he was fatigued from previous sessions but agreeable to tx. He voiced 10/10 pain in supine, unable to recall when he last received pain medication. RN notified, and pain medicine had last been administered 2 hours prior to therapy and pt was unable to have more meds at this time. Pt required max A for supine> EOB with HOB elevated and VCs for technique. Upon sitting EOB, pt stated he had no pain. Pt donned B shoes and socks while seated EOB with min steadying assist. Pt with differing reports on pain level throughout session ranging from 0/10 pain to 10/10 pain. Pt taken to ADL apartment with pt self-propelling w/c with supervision and VCs for technique. In ADL apartment, pt sat on edge of w/c to complete weight shifting task, required to reach towards floor to obtain cone and reach to opposite side to hand off cone to therapist. Pt demonstration functional unsupported sitting balance during task. Pt then complete toe tap exercises from w/c level onto cones. Pt demonstrates increased ability to lift L LE vs R LE, only able to clear ~5 inches when stepping up. Pt then visiting by pet therapy dog, reaching in various planes to pet dog. Pt self-propelled w/c back to room at end of  session. He ambulated in room, ~55ft to bed where he required mod A to return to supine. Pt voiced increased comfort upon obtaining supine position.   Pt left in supine with all needs in reach, bed alarm on. Pt demonstrates decreased short term memory, unable to recall events from previous therapy sessions today, and last time pain medicine administered. Education provided regarding POC, recommendation to have nursing write on communication board next time pain meds due, benefits of OOB, and d/c planning.   Therapy Documentation Precautions:  Precautions Precautions: Fall Precaution Comments: watch BP Restrictions Weight Bearing Restrictions: Yes RLE Weight Bearing: Weight bearing as tolerated Vital Signs: Therapy Vitals Temp: 98.5 F (36.9 C) Temp Source: Oral Pulse Rate: 62 Resp: 16 BP: 133/60 mmHg Patient Position (if appropriate): Lying Oxygen Therapy SpO2: 96 % O2 Device: Not Delivered Pain: Pain Assessment Pain Assessment: 0-10 Pain Score: 6  Pain Type: Surgical pain Pain Location: Hip Pain Orientation: Right Pain Descriptors / Indicators: Aching Pain Frequency: Intermittent Pain Onset: On-going Patients Stated Pain Goal: 3 Pain Intervention(s): Medication (See eMAR);Repositioned Multiple Pain Sites: No  See FIM for current functional status  Therapy/Group: Individual Therapy  [name], [name] 07/06/2014, 3:29 PM

## 2014-07-06 NOTE — Progress Notes (Signed)
ANTICOAGULATION CONSULT NOTE - Follow Up Consult  Pharmacy Consult for coumadin Indication: atrial fibrillation  Allergies  Allergen Reactions  . Crestor [Rosuvastatin] Other (See Comments)    Aches in joint   . Lipitor [Atorvastatin] Other (See Comments)    Joint aching  . Penicillins Rash  . Tramadol Nausea Only  . Vicodin [Hydrocodone-Acetaminophen] Anxiety    "Made me nervous"    Patient Measurements: Weight: 249 lb 5.4 oz (113.1 kg) Heparin Dosing Weight:   Vital Signs: Temp: 98.1 F (36.7 C) (04/27 0555) Temp Source: Oral (04/27 0555) BP: 130/56 mmHg (04/27 0555) Pulse Rate: 63 (04/27 0555)  Labs:  Recent Labs  07/04/14 0441 07/05/14 1231 07/06/14 0705  HGB 9.0* 9.9*  --   HCT 29.3* 31.8*  --   PLT 335 402*  --   LABPROT 15.9* 18.5* 20.0*  INR 1.25 1.53* 1.68*  CREATININE 3.72* 3.05*  --     Estimated Creatinine Clearance: 23.7 mL/min (by C-G formula based on Cr of 3.05).   Medications:  Scheduled:  . amiodarone  200 mg Oral q morning - 10a  . atenolol  25 mg Oral Q breakfast  . bacitracin   Topical BID  . enoxaparin (LOVENOX) injection  30 mg Subcutaneous Q24H  . finasteride  5 mg Oral Daily  . folic acid  239 mcg Oral Daily  . furosemide  40 mg Oral Daily  . glimepiride  1 mg Oral Q breakfast  . insulin aspart  0-9 Units Subcutaneous TID WC  . iron polysaccharides  150 mg Oral BID AC  . multivitamin with minerals  1 tablet Oral Daily  . omega-3 acid ethyl esters  1 g Oral Daily  . Warfarin - Pharmacist Dosing Inpatient   Does not apply q1800   Infusions:    Assessment: 81 yo male with hx of afib is currently on subtherapeutic coumadin.  INR today is 1.68 from 1.53.  Also on lovenox 30 mg sq q24h.  Goal of Therapy:  INR 2-3 Monitor platelets by anticoagulation protocol: Yes   Plan:  - Coumadin 5 mg PO once today.  - Continue Lovenox 30mg  SQ Q24H until INR therapeutic - Daily PT / INR  Kenston Longton, Tsz-Yin 07/06/2014,8:25 AM

## 2014-07-07 ENCOUNTER — Inpatient Hospital Stay (HOSPITAL_COMMUNITY): Payer: Medicare Other

## 2014-07-07 ENCOUNTER — Inpatient Hospital Stay (HOSPITAL_COMMUNITY): Payer: Medicare Other | Admitting: Occupational Therapy

## 2014-07-07 LAB — BASIC METABOLIC PANEL
ANION GAP: 8 (ref 5–15)
BUN: 74 mg/dL — ABNORMAL HIGH (ref 6–23)
CO2: 26 mmol/L (ref 19–32)
CREATININE: 2.65 mg/dL — AB (ref 0.50–1.35)
Calcium: 8.8 mg/dL (ref 8.4–10.5)
Chloride: 108 mmol/L (ref 96–112)
GFR calc Af Amer: 24 mL/min — ABNORMAL LOW (ref >90)
GFR calc non Af Amer: 20 mL/min — ABNORMAL LOW (ref >90)
Glucose, Bld: 92 mg/dL (ref 70–99)
Potassium: 4.8 mmol/L (ref 3.5–5.1)
Sodium: 142 mmol/L (ref 135–145)

## 2014-07-07 LAB — GLUCOSE, CAPILLARY
GLUCOSE-CAPILLARY: 97 mg/dL (ref 70–99)
GLUCOSE-CAPILLARY: 147 mg/dL — AB (ref 70–99)
Glucose-Capillary: 120 mg/dL — ABNORMAL HIGH (ref 70–99)
Glucose-Capillary: 97 mg/dL (ref 70–99)

## 2014-07-07 LAB — PROTIME-INR
INR: 1.91 — ABNORMAL HIGH (ref 0.00–1.49)
Prothrombin Time: 22 seconds — ABNORMAL HIGH (ref 11.6–15.2)

## 2014-07-07 MED ORDER — WARFARIN SODIUM 5 MG PO TABS
5.0000 mg | ORAL_TABLET | Freq: Once | ORAL | Status: AC
Start: 2014-07-07 — End: 2014-07-07
  Administered 2014-07-07: 5 mg via ORAL
  Filled 2014-07-07: qty 1

## 2014-07-07 NOTE — Progress Notes (Signed)
Recreational Therapy Session Note  Patient Details  Name: Christopher [name] MRN: [id number] Date of Birth: [birth year] Today's Date: 07/07/2014  Order received & chart reviewed.  Met with pt during co-treat with OT to complete leisure screen.  Pt placed on HOLD for TR services at this time.  Will monitor through team for future participation. Dante 07/07/2014, 8:48 AM

## 2014-07-07 NOTE — Progress Notes (Signed)
Social Work Assessment and Plan  Patient Details  Name: Christopher [name] MRN: [id number] Date of Birth: November 21, 1926  Today's Date: 07/07/2014  Problem List:  Patient Active Problem List   Diagnosis Date Noted  . Fracture of femoral neck, right   . Intertrochanteric fracture of left hip   . Closed right hip fracture 06/28/2014  . CHF (congestive heart failure) 06/28/2014  . Hip fracture 06/28/2014  . Fall   . Acute diastolic congestive heart failure, NYHA class 2 07/24/2013  . Acute respiratory failure 07/24/2013  . DM type 2 causing renal disease 07/24/2013  . Shortness of breath 07/24/2013  . Malignant neoplasm of prostate 07/16/2013  . History of amiodarone therapy 07/16/2012  . Atrial fibrillation 08/07/2011  . Lumbar disc disease   . Hyperlipidemia   . Hypertensive heart disease without CHF   . Kidney disease, chronic, stage III (GFR 30-59 ml/min)   . Long-term (current) use of anticoagulants   . BPH (benign prostatic hyperplasia)   . Cardiac pacemaker in situ   . CAD (coronary artery disease) 03/06/2010   Past Medical History:  Past Medical History  Diagnosis Date  . Lumbar disc disease   . BPH (benign prostatic hyperplasia)   . HTN (hypertension)   . Diabetes mellitus type II   . Sinus node dysfunction     symptomatic bradycardia  . Pacemaker     Medtronic-dual-chamber-DOI 2012  . CAD (coronary artery disease)     followed by Dr. Wynonia Lawman  . Arthritis   . History of skin cancer   . Frequency of urination   . Nocturia   . Chronic kidney disease     kidneys function at "20 %" followed by Dr. Mercy Moore  . Cancer     history of skin cancer  . Dysrhythmia     a fib  . Prostate cancer   . Ruptured disk 1992  . Skin cancer    Past Surgical History:  Past Surgical History  Procedure Laterality Date  . Coronary artery bypass graft  2003    x3  . Cardiac catheterization    . Lumbar laminectomy  11/01  . Cardioversion  08/08/2011    Procedure: CARDIOVERSION;   Surgeon: Jacolyn Reedy, MD;  Location: [facility];  Service: Cardiovascular;  Laterality: N/A;  . Cardioversion N/A 07/16/2012    Procedure: CARDIOVERSION;  Surgeon: Jacolyn Reedy, MD;  Location: [facility];  Service: Cardiovascular;  Laterality: N/A;  . Pacemaker insertion  2012  . Cataracts removed    . Cystoscopy with biopsy N/A 06/21/2013    Procedure: CYSTOSCOPY WITH BIOPSY;  Surgeon: Molli Hazard, MD;  Location: [facility];  Service: Urology;  Laterality: N/A;    TUR RESECTION OF POLYP BILATERAL RETROGRADE PYELOGRAM BLADDER BIOPSY    . Prostate biopsy N/A 06/21/2013    Procedure: BIOPSY TRANSRECTAL ULTRASONIC PROSTATE (TUBP);  Surgeon: Molli Hazard, MD;  Location: [facility];  Service: Urology;  Laterality: N/A;  PROSTATE NERVE BLOCK  . Appendectomy  1980  . Hip pinning,cannulated Right 06/30/2014    Procedure: CANNULATED HIP PINNING;  Surgeon: Renette Butters, MD;  Location: [facility];  Service: Orthopedics;  Laterality: Right;   Social History:  reports that he quit smoking about 46 years ago. His smoking use included Cigarettes. He smoked 0.75 packs per day. He has never used smokeless tobacco. He reports that he does not drink alcohol or use illicit drugs.  Family / Support Systems Marital Status: Married How Long?: will be 25  years in June Patient Roles: Spouse, Parent (father, stepfather, grandfather, great grandfather) Spouse/Significant Other: Roberth Berling - wife - [phone] (h) [phone] (m) Children: 3 sons and wife has 2 dtrs and 1 son; many grandchildren and great grandchildren Anticipated Caregiver: wife; children available as needed Ability/Limitations of Caregiver: none Caregiver Availability: 24/7 Family Dynamics: Pt's wife is supportive and can help pt as needed.  Social History Preferred language: English Religion: Protestant Read: Yes Write: Yes Employment Status: Retired Date Retired/Disabled/Unemployed: 1996 Age Retired: 70 Special educational needs teacher Issues: none reported Guardian/Conservator: N/A   Abuse/Neglect Physical Abuse: Denies Verbal Abuse: Denies Sexual Abuse: Denies Exploitation of patient/patient's resources: Denies Self-Neglect: Denies  Emotional Status Pt's affect, behavior and adjustment status: Pt is staying positive and feels he will recover and be able to return to his previous activities. Recent Psychosocial Issues: none reported Psychiatric History: none reported Substance Abuse History: none reported  Patient / Family Perceptions, Expectations & Goals Pt/Family understanding of illness & functional limitations: Pt and wife feel their questions have been answered and they have a good understanding of pt's condition and limitations. Premorbid pt/family roles/activities: Pt enjoys photography, watching TV, and spending time with family.  Was caring for outside of house. Anticipated changes in roles/activities/participation: Pt hopes to resume activities as he is able. Pt/family expectations/goals: Pt just wants to get home!  Community Resources [employer] Scripts: None Premorbid Home Care/DME Agencies: None (has a cane) Transportation available at discharge: wife  Discharge Planning Living Arrangements: Spouse/significant other Support Systems: Spouse/significant other, Children, Other relatives, Friends/neighbors Type of Residence: Private residence Insurance Resources: [employer], Multimedia programmer (specify) Web designer) Financial Resources: Radio broadcast assistant Screen Referred: No Money Management: Patient, Spouse Does the patient have any problems obtaining your medications?: No Home Management: Pt and wife share this responsibility. Wife/family can manage while pt recovers. Patient/Family Preliminary Plans: Pt plans to return to his home with his wife to provide 24/7 supervision. Barriers to Discharge: Steps (Pt's sons are building him a level entry into his home.) Social Work  Anticipated Follow Up Needs: HH/OP Expected length of stay: 10 to 14 days  Clinical Impression CSW met with pt and his wife to introduce self and role of CSW, as well as to complete assessment.  Pt and wife were very pleased with pt having the opportunity to come to CIR and feel it is doing well so far.  They feel their questions have been answered and have no concerns/needs at this time.  Pt's wife is in good health and will be with him 24/7 at d/c and can provide supervision.  Pt has a large extended family with a lot of support.  CSW will continue to follow and assist as needed.  Tesla Bochicchio, Silvestre Mesi 07/07/2014, 1:03 PM

## 2014-07-07 NOTE — IPOC Note (Addendum)
Overall Plan of Care [facility]) Patient Details Name: Christopher [name] MRN: [id number] DOB: Oct 18, 1926  Admitting Diagnosis: RT HIP FX S P [facility] Problems: Principal Problem:   Fracture of femoral neck, right Active Problems:   Atrial fibrillation   Hypertensive heart disease without CHF   Kidney disease, chronic, stage III (GFR 30-59 ml/min)     Functional Problem List: Nursing Bladder, Bowel, Endurance, Pain, Safety, Skin Integrity  PT Balance, Edema, Endurance, Motor, Pain, Skin Integrity  OT Balance, Endurance, Pain, Motor  SLP    TR         Basic ADL's: OT Bathing, Dressing, Toileting     Advanced  ADL's: OT       Transfers: PT Bed Mobility, Bed to Chair, Car, Manufacturing systems engineer, Metallurgist: PT Ambulation, Emergency planning/management officer, Stairs     Additional Impairments: OT    SLP        TR      Anticipated Outcomes Item Anticipated Outcome  Self Feeding Mod I  Swallowing      Basic self-care  Mod I  Insurance underwriter Transfers Supervision  Bowel/Bladder  Continent to bowel and bladder.  Transfers  S overall; min A car  Locomotion  S gait household distances; mod I w/c mobility  Communication     Cognition     Pain  Less than 3 on scale 1 to 10  Safety/Judgment  Free from fall during his stay in rehab.   Therapy Plan: PT Intensity: Minimum of 1-2 x/day ,45 to 90 minutes PT Frequency: 5 out of 7 days PT Duration Estimated Length of Stay: 10-14 days OT Intensity: Minimum of 1-2 x/day, 45 to 90 minutes OT Frequency: 5 out of 7 days OT Duration/Estimated Length of Stay: 10-14 days         Team Interventions: Nursing Interventions Patient/Family Education, Bladder Management, Bowel Management, Skin Care/Wound Management, Pain Management  PT interventions Ambulation/gait training, Training and development officer, Community reintegration, Discharge planning, Disease management/prevention, DME/adaptive equipment  instruction, Functional mobility training, Neuromuscular re-education, Pain management, Patient/family education, Psychosocial support, Skin care/wound management, Splinting/orthotics, Stair training, Therapeutic Activities, Therapeutic Exercise, UE/LE Strength taining/ROM, UE/LE Coordination activities, Wheelchair propulsion/positioning  OT Interventions Training and development officer, Discharge planning, Community reintegration, Engineer, drilling, Pain management, Patient/family education, Self Care/advanced ADL retraining, Therapeutic Activities, Therapeutic Exercise  SLP Interventions    TR Interventions    SW/CM Interventions Discharge Planning, Psychosocial Support, Patient/Family Education    Team Discharge Planning: Destination: PT-Home ,OT- Home , SLP-  Projected Follow-up: PT-Home health PT, 24 hour supervision/assistance, OT-  Home health OT, SLP-  Projected Equipment Needs: PT-Rolling walker with 5" wheels, Wheelchair (measurements), Wheelchair cushion (measurements), OT- To be determined, SLP-  Equipment Details: PT- , OT-  Patient/family involved in discharge planning: PT- Patient,  OT-Patient, SLP-   MD ELOS: 10-13d Medical Rehab Prognosis:  Good Assessment: 81 y.o. male with H/o HTN, DM type 2, CAD with CABG/PPM, CKD- baseline Cr- 3.62, A Fib-chronic Coumadin; who was admitted on 06/28/2014 after a fall. No LOC but he reported some lightheadedness when he stood from the sitting position with subsequent fall onto his right hip. Cranial CT scan was negative. X-rays of right hip showed a nondisplaced femoral neck fracture. He underwent cannulated hip pinning 06/30/2014 per Dr. Edmonia Lynch the same day and is WBAT RLE.   Now requiring 24/7 Rehab RN,MD, as well as CIR level PT, OT .  Treatment team will focus  on ADLs and mobility with goals set at Sup/Mod I  See Team Conference Notes for weekly updates to the plan of care

## 2014-07-07 NOTE — Progress Notes (Signed)
[place] PHYSICAL MEDICINE & REHABILITATION     PROGRESS NOTE    Subjective/Complaints: Overall doing well. Working through hip pain. Pt denies nausea, vomiting, abdominal pain, diarrhea, chest pain, shortness of breath, palpitations, dizziness   Objective: Vital Signs: Blood pressure 133/60, pulse 62, temperature 98.5 F (36.9 C), temperature source Oral, resp. rate 16, weight 113.1 kg (249 lb 5.4 oz), SpO2 96 %. No results found.  Recent Labs  07/05/14 1231  WBC 13.6*  HGB 9.9*  HCT 31.8*  PLT 402*    Recent Labs  07/05/14 1231 07/07/14 0545  NA 139 142  K 5.2* 4.8  CL 106 108  GLUCOSE 130* 92  BUN 83* 74*  CREATININE 3.05* 2.65*  CALCIUM 8.9 8.8   CBG (last 3)   Recent Labs  07/06/14 1645 07/06/14 2054 07/07/14 0654  GLUCAP 87 106* 97    Wt Readings from Last 3 Encounters:  07/05/14 113.1 kg (249 lb 5.4 oz)  07/04/14 112.7 kg (248 lb 7.3 oz)  07/28/13 101.6 kg (223 lb 15.8 oz)    Physical Exam:  Constitutional: He is oriented to person, place, and time. He appears well-developed and well-nourished.  HENT:  Head: Normocephalic and atraumatic.  Right Ear: External ear normal.  Left Ear: External ear normal.  Eyes: Conjunctivae are normal. Pupils are equal, round, and reactive to light.  Neck: Normal range of motion. Neck supple.  Cardiovascular: Normal rate and regular rhythm. Exam reveals no gallop and no friction rub.  No murmur heard. Respiratory: Effort normal and breath sounds normal. No respiratory distress. He has no wheezes. He has no rales.  GI: Soft. He exhibits distension. Bowel sounds are decreased. There is no tenderness.  Musculoskeletal:  Min edema right hip and thigh. Small incisions which are intact, covered with foam dressing. right thigh tender. Neurological: He is alert and oriented to person, place, and time. No cranial nerve deficit. Coordination normal.  Right Hip and knee pain with lower extremity range of motion no  pain with ankle range of motion No pain with left lower extremity range of motion Upper extremity strength 5/5 bilateral deltoids, biceps, triceps, grip Left lower extremity 4/5 and hip flexor and extensor ankle dorsal flexion plantar flexion Trace right hip flexion knee extension 4 minus ankle dorsiflexion plantarflexion Skin: Skin is warm and dry. Incision clean. Right forearm tear clean.Psychiatric: He has a normal mood and affect. His behavior is normal.     Assessment/Plan: 1. Functional deficits secondary to right femoral neck fracture which require 3+ hours per day of interdisciplinary therapy in a comprehensive inpatient rehab setting. Physiatrist is providing close team supervision and 24 hour management of active medical problems listed below. Physiatrist and rehab team continue to assess barriers to discharge/monitor patient progress toward functional and medical goals. FIM: FIM - Bathing Bathing Steps Patient Completed: Chest, Right upper leg, Left upper leg, Right Arm, Left Arm, Abdomen, Front perineal area, Buttocks Bathing: 4: Min-Patient completes 8-9 57f 10 parts or 75+ percent  FIM - Upper Body Dressing/Undressing Upper body dressing/undressing steps patient completed: Thread/unthread right sleeve of pullover shirt/dresss, Thread/unthread left sleeve of pullover shirt/dress, Put head through opening of pull over shirt/dress Upper body dressing/undressing: 4: Min-Patient completed 75 plus % of tasks FIM - Lower Body Dressing/Undressing Lower body dressing/undressing steps patient completed: Don/Doff left shoe, Fasten/unfasten right shoe, Fasten/unfasten left shoe, Don/Doff right shoe, Don/Doff left sock, Don/Doff right sock Lower body dressing/undressing: 4: Steadying Assist  FIM - Toileting Toileting Assistive Devices: Grab bar  or rail for support Toileting: 1: Total-Patient completed zero steps, helper did all 3  FIM - Radio producer  Devices: Engineer, civil (consulting), Insurance account manager Transfers: 3-To toilet/BSC: Mod A (lift or lower assist), 3-From toilet/BSC: Mod A (lift or lower assist)  FIM - Control and instrumentation engineer Devices: Walker, Arm rests, HOB elevated, Bed rails Bed/Chair Transfer: 2: Supine > Sit: Max A (lifting assist/Pt. 25-49%), 3: Sit > Supine: Mod A (lifting assist/Pt. 50-74%/lift 2 legs), 4: Bed > Chair or W/C: Min A (steadying Pt. > 75%), 4: Chair or W/C > Bed: Min A (steadying Pt. > 75%)  FIM - Locomotion: Wheelchair Distance: 175 Locomotion: Wheelchair: 4: Travels 150 ft or more: maneuvers on rugs and over door sillls with minimal assistance (Pt.>75%) FIM - Locomotion: Ambulation Locomotion: Ambulation Assistive Devices: Administrator Ambulation/Gait Assistance: 4: Min assist Locomotion: Ambulation: 1: Travels less than 50 ft with minimal assistance (Pt.>75%)  Comprehension Comprehension Mode: Auditory Comprehension: 5-Understands complex 90% of the time/Cues < 10% of the time  Expression Expression Mode: Verbal Expression: 5-Expresses complex 90% of the time/cues < 10% of the time  Social Interaction Social Interaction: 6-Interacts appropriately with others with medication or extra time (anti-anxiety, antidepressant).  Problem Solving Problem Solving: 5-Solves basic problems: With no assist  Memory Memory: 6-More than reasonable amt of time   Medical Problem List and Plan: 1. Functional deficits secondary to right femoral neck fracture after fall s/p pinning 2. DVT Prophylaxis/Anticoagulation: Pharmaceutical: Lovenox until coumadin therapeutic 3. Pain Management:   Will use oxycodone prn. 4. Mood: Motivated to get better. LCSW to follow for evaluation and support.  5. Neuropsych: This patient is capable of making decisions on his own behalf. 6. Skin/Wound Care: Routine pressure relief measures. local care.  7. Fluids/Electrolytes/Nutrition: Monitor I/O. Encourage  patient to push fluids.  8. DM type 2:   Will monitor BS with ac/hs checks. Resumed amaryl   -sugars reasonable at present 9. A fib: Will monitor HR every 8 hours. Continue amiodarone and atenolol for rate control.  10. Constipation:   results with supp and enema 11. HTN: Monitor BP every 8 hours. Continue lasix 40 mg daily as well as porscar and tenormin. 12. Acute on chronic anemia:   iron supplement.  13. Leukocytosis: no obvious source. Denies urinary symptoms. Wound looks good. Lungs clear  -recheck cbc tomorrow  LOS (Days) 3 A FACE TO FACE EVALUATION WAS PERFORMED  SWARTZ,ZACHARY T 07/07/2014 8:27 AM

## 2014-07-07 NOTE — Progress Notes (Signed)
[facility] Individual Statement of Services  Patient Name:  Christopher [name]  Date:  07/07/2014  Welcome to the [facility].  Our goal is to provide you with an individualized program based on your diagnosis and situation, designed to meet your specific needs.  With this comprehensive rehabilitation program, you will be expected to participate in at least 3 hours of rehabilitation therapies Monday-Friday, with modified therapy programming on the weekends.  Your rehabilitation program will include the following services:  Physical Therapy (PT), Occupational Therapy (OT), 24 hour per day rehabilitation nursing, Case Management (Social Worker), Rehabilitation Medicine, Nutrition Services and Pharmacy Services  Weekly team conferences will be held on Tuesdays to discuss your progress.  Your Social Worker will talk with you frequently to get your input and to update you on team discussions.  Team conferences with you and your family in attendance may also be held.  Expected length of stay:  10 to 14 days  Overall anticipated outcome:  Supervision  Depending on your progress and recovery, your program may change. Your Social Worker will coordinate services and will keep you informed of any changes. Your Social Worker's name and contact numbers are listed  below.  The following services may also be recommended but are not provided by the [facility] will be made to provide these services after discharge if needed.  Arrangements include referral to agencies that provide these services.  Your insurance has been verified to be:  Medicare and [place] Your primary doctor is:  Dr. Prince Solian  Pertinent information will be shared with your doctor and your insurance company.  Social Worker:  Alfonse Alpers, LCSW  [phone] or ([phone]  Information discussed with and copy given to patient by: Trey Sailors, 07/07/2014, 1:07 PM

## 2014-07-07 NOTE — Progress Notes (Signed)
Physical Therapy Session Note  Patient Details  Name: Christopher [name] MRN: [id number] Date of Birth: February 13, 1927  Today's Date: 07/07/2014 PT Individual Time: 1300-1400 PT Individual Time Calculation (min): 60 min   Short Term Goals: Week 1:  PT Short Term Goal 1 (Week 1): Pt will perform functional transfers with min A PT Short Term Goal 2 (Week 1): Pt will be able to gait x 50' with min A PT Short Term Goal 3 (Week 1): Pt will be able to go up/down 2 steps with L rail for home entry with min A  Skilled Therapeutic Interventions/Progress Updates:    Session focused on functional transfers including OOB and on/off toilet with min A, bed mobility on flat surface, gait training with RW x 60'  X 2 with cues for upright posture and positioning of RW, and supine therex for functional strengthening and ROM. Supine therex including heel slides, SAQ, quad sets with 5 second hold, ankle pumps, and hip abduction x 10 reps each. Pt requires min A for bed mobility to manage RLE and cues for technique. Transfers with close S to min A throughout session with decreased cueing needed for hand placement. Ice pack applied to R hip end of session for pain and edema control. Educated to leave on for 20 min at a time.  Therapy Documentation Precautions:  Precautions Precautions: Fall Precaution Comments: watch BP Restrictions Weight Bearing Restrictions: Yes RLE Weight Bearing: Weight bearing as tolerated   Pain: 10/10 pain in R hip during movement. Ice pack applied at end of session.  Locomotion : Ambulation Ambulation/Gait Assistance: 4: Min assist   See FIM for current functional status  Therapy/Group: Individual Therapy  Canary Brim Ivory Broad, PT, DPT  07/07/2014, 3:10 PM

## 2014-07-07 NOTE — Progress Notes (Signed)
Occupational Therapy Session Note  Patient Details  Name: Christopher [name] MRN: [id number] Date of Birth: [birth year]  Today's Date: 07/07/2014 OT Individual Time: 1430-1500 OT Individual Time Calculation (min): 30 min    Short Term Goals: Week 1:  OT Short Term Goal 1 (Week 1): Pt will complete toilet transfers with min A OT Short Term Goal 2 (Week 1): Pt will complete LB dressing with min A OT Short Term Goal 3 (Week 1): Pt will tolerate 5 minutes of standing with min A to complete grooming task OT Short Term Goal 4 (Week 1): Pt will complete toileting task with mod A  Skilled Therapeutic Interventions/Progress Updates:    Pt seen for OT session focusing on functional mobility, functional standing balance and LE strengthening. Pt in supine upon arrival, agreeable to tx. Pt required assist to transition to EOB with VCs for technique and assist at the trunk. He stood from bed to RW with min A and ambulated~5 feet to w/c. Pt self propelled w/c to therapy gym with emphasis on UE strengthening and functional activity tolerance. In kitchen, pt tolerated ~3 minutes of static standing to remover and replace items in overhead cupboard. Pt demonstrated ability to use B UEs during functional task, reaching across midline, and overhead with steadying assist while standing. Pt with posterior lean when standing and not using UE to stabilize. Pt required seated rest break following task. While seated on stool, pt completed B LE strengthening exercises, lifting each leg with knee bent x 10 reps, and completing B LE AB/ADduction with VCs for technique/ posture. Pt ambulated back to w/c with min A, ~10 ft. Pt taken back to room total A due to fatigue. In room, pt ambulated to EOB with min A and RW. He required mod A to return to supine position. Pt left in supine with all needs in reach and wife present. Ice pack placed on pt's R hip, as it was prior to start of therapy session.    Education provided regarding OT  goals, POC, DME, energy conservation, home layout, and d/c planning.   Therapy Documentation Precautions:  Precautions Precautions: Fall Precaution Comments: watch BP Restrictions Weight Bearing Restrictions: Yes RLE Weight Bearing: Weight bearing as tolerated Pain: Pain Assessment Pain Assessment: 7/10 Pain Type: Surgical pain Pain Location: Hip Pain Orientation: Right Pain Descriptors / Indicators: Aching Pain Onset: On-going Pain Intervention(s): Repositioned; increased activity/ambulation; ice Multiple Pain Sites: No  See FIM for current functional status  Therapy/Group: Individual Therapy  [name], Cataleya Cristina C 07/07/2014, 3:14 PM

## 2014-07-07 NOTE — Patient Care Conference (Signed)
Inpatient RehabilitationTeam Conference and Plan of Care Update Date: 07/05/2014   Time: 2:15 PM    Patient Name: Christopher [name]      Medical Record Number: [id number]  Date of Birth: 14-Mar-1926 Sex: Male         Room/Bed: 4M11C/4M11C-01 Payor Info: Payor: MEDICARE / Plan: MEDICARE PART A AND B / Product Type: *No Product type* /    Admitting Diagnosis: RT HIP FX S P PINNNG   Admit Date/Time:  07/04/2014  6:08 PM Admission Comments: No comment available   Primary Diagnosis:  Fracture of femoral neck, right Principal Problem: Fracture of femoral neck, right  Patient Active Problem List   Diagnosis Date Noted  . Fracture of femoral neck, right   . Intertrochanteric fracture of left hip   . Closed right hip fracture 06/28/2014  . CHF (congestive heart failure) 06/28/2014  . Hip fracture 06/28/2014  . Fall   . Acute diastolic congestive heart failure, NYHA class 2 07/24/2013  . Acute respiratory failure 07/24/2013  . DM type 2 causing renal disease 07/24/2013  . Shortness of breath 07/24/2013  . Malignant neoplasm of prostate 07/16/2013  . History of amiodarone therapy 07/16/2012  . Atrial fibrillation 08/07/2011  . Lumbar disc disease   . Hyperlipidemia   . Hypertensive heart disease without CHF   . Kidney disease, chronic, stage III (GFR 30-59 ml/min)   . Long-term (current) use of anticoagulants   . BPH (benign prostatic hyperplasia)   . Cardiac pacemaker in situ   . CAD (coronary artery disease) 03/06/2010    Expected Discharge Date: Expected Discharge Date: 07/16/14  Team Members Present: Physician leading conference: Dr. Alger Simons Social Worker Present: Alfonse Alpers, LCSW Nurse Present: Dorien Chihuahua, RN PT Present: Canary Brim, PT;Jess Anastasia Fiedler, PT (Napoleon Form, OT) OT Present: Willeen Cass, OT;Other (comment) SLP Present: Weston Anna, SLP Other (Discipline and Name): Danne Baxter, RN [facility]) PPS Coordinator present : Daiva Nakayama, RN, CRRN     Current  Status/Progress Goal Weekly Team Focus  Medical   right femoral neck fracture, dm, obstipation  improve functional mobility  pain, ortho precautions   Bowel/Bladder   Continent to bowel and bladder.  To continue continent to bowel and bladder.  To monitor bladder and bowel function Q shift.   Swallow/Nutrition/ Hydration             ADL's   max A LB dressing; mod A functional transfers  Overall S  Sitting balance; functional activity tolerance; functional mobility and transfers   Mobility   min to mod A overall  S overall; min A car   endurance, strengthening,  ROM, gait, transfers, stairs, balance   Communication             Safety/Cognition/ Behavioral Observations            Pain   Rt. hip pain,using PRN Oxi IR 5 mg.   To keep pain levels less than 3.On scale 1 to 10.  To monitor pain levels Q 2 hrs. and response to activity.   Skin   Incision on Rt.hip with small foam.Two skin tears on Rt. arm with daily dressing change.  To keep skin free of breakdown.  To monitor the skin Q shift.    Rehab Goals Patient on target to meet rehab goals: Yes Rehab Goals Revised: none, pt's first conference *See Care Plan and progress notes for long and short-term goals.  Barriers to Discharge: medication se, ortho issues    Possible Resolutions  to Barriers:  mod I goals, modify meds    Discharge Planning/Teaching Needs:  Pt plans to return to his home to his wife who will provide 24/7 supervision  Pt's wife will be available for family education, as needed.  She has been present for therapies already.   Team Discussion:  Pt with hip fracture after a fall.  OT reported pt had a BM today and they are questioning that medications may be making pt foggy.  PT reports pt's barriers are pain and getting started from sit to stand.  Otherwise, this was evaluation day and therapists will know more once they work with pt further.  Revisions to Treatment Plan:  None   Continued Need for Acute  Rehabilitation Level of Care: The patient requires daily medical management by a physician with specialized training in physical medicine and rehabilitation for the following conditions: Daily direction of a multidisciplinary physical rehabilitation program to ensure safe treatment while eliciting the highest outcome that is of practical value to the patient.: Yes Daily medical management of patient stability for increased activity during participation in an intensive rehabilitation regime.: Yes Daily analysis of laboratory values and/or radiology reports with any subsequent need for medication adjustment of medical intervention for : Post surgical problems;Other  Solaris Kram, Silvestre Mesi 07/07/2014, 1:11 PM

## 2014-07-07 NOTE — Progress Notes (Signed)
ANTICOAGULATION CONSULT NOTE - Follow Up Consult  Pharmacy Consult for coumadin Indication: atrial fibrillation  Allergies  Allergen Reactions  . Crestor [Rosuvastatin] Other (See Comments)    Aches in joint   . Lipitor [Atorvastatin] Other (See Comments)    Joint aching  . Penicillins Rash  . Tramadol Nausea Only  . Vicodin [Hydrocodone-Acetaminophen] Anxiety    "Made me nervous"    Patient Measurements: Weight: 249 lb 5.4 oz (113.1 kg) Heparin Dosing Weight:   Vital Signs:    Labs:  Recent Labs  07/05/14 1231 07/06/14 0705 07/07/14 0545  HGB 9.9*  --   --   HCT 31.8*  --   --   PLT 402*  --   --   LABPROT 18.5* 20.0* 22.0*  INR 1.53* 1.68* 1.91*  CREATININE 3.05*  --  2.65*    Estimated Creatinine Clearance: 27.3 mL/min (by C-G formula based on Cr of 2.65).   Medications:  Scheduled:  . amiodarone  200 mg Oral q morning - 10a  . atenolol  25 mg Oral Q breakfast  . enoxaparin (LOVENOX) injection  30 mg Subcutaneous Q24H  . finasteride  5 mg Oral Daily  . folic acid  527 mcg Oral Daily  . furosemide  40 mg Oral Daily  . glimepiride  1 mg Oral Q breakfast  . insulin aspart  0-9 Units Subcutaneous TID WC  . iron polysaccharides  150 mg Oral BID AC  . multivitamin with minerals  1 tablet Oral Daily  . omega-3 acid ethyl esters  1 g Oral Daily  . Warfarin - Pharmacist Dosing Inpatient   Does not apply q1800   Infusions:    Assessment: 81 yo male with afib is currently on slightly subtherapeutic coumadin.  INR is rising nicely to 1.91 from 1.68. Patient is also on lovenox 30mg  sq q24h.  Goal of Therapy:  INR 2-3 Monitor platelets by anticoagulation protocol: Yes   Plan:  - Coumadin 5 mg PO once today.  - Continue Lovenox 30mg  SQ Q24H until INR therapeutic - Daily PT / INR - monitor renal function  Mariem Skolnick, Tsz-Yin 07/07/2014,8:31 AM

## 2014-07-07 NOTE — Progress Notes (Signed)
Occupational Therapy Session Note  Patient Details  Name: Christopher [name] MRN: [id number] Date of Birth: 10-10-1926  Today's Date: 07/07/2014 OT Individual Time: 1000-1100 OT Individual Time Calculation (min): 60 min    Short Term Goals: Week 1:  OT Short Term Goal 1 (Week 1): Pt will complete toilet transfers with min A OT Short Term Goal 2 (Week 1): Pt will complete LB dressing with min A OT Short Term Goal 3 (Week 1): Pt will tolerate 5 minutes of standing with min A to complete grooming task OT Short Term Goal 4 (Week 1): Pt will complete toileting task with mod A  Skilled Therapeutic Interventions/Progress Updates:    Pt seen for OT ADL bathing and dressing session. Pt in w/c upon arrival, having just finished with PT. Pt voiced 8/10 pain R hip, RN made aware, and pt agreeable to cont with tx. Pt ambulated from w/c> shower chair in walk-in shower with min A and VCs for RW management. Pt bathed with  steadying assist to stand and wash buttock and min A to wash B feet. Following shower, pt completed grooming and dressing tasks seated in w/c at the sink. Pt able to thread pants and stand at sink to pull pants up. Pt required assist to don R sock.  Pt completed grooming from seated level with set-up. He then completed static standing at the sink inititially with B UE support on counter, and then tolerating one UE support with min steadying assist. When standing with no UE support, pt required mod steadying assist as he has strong posterior lean when standing unsupported. Pt then transferred w/c> bed  With RW and min A. He required mod A for management of B LEs to return to supine. Pt left in supine with all needs in reach bed alarm on and wife present.   Education provided regarding energy conservation, DME, modified dressing technique, donning lotion distal to proximal ankle pumps and elevating LEs to reduce swelling, and d/c planning.   Therapy Documentation Precautions:   Precautions Precautions: Fall Precaution Comments: watch BP Restrictions Weight Bearing Restrictions: Yes RLE Weight Bearing: Weight bearing as tolerated Pain: Pain Assessment Pain Score: 8  Pain Type: Surgical pain Pain Location: Hip Pain Orientation: Right Pain Descriptors / Indicators: Aching Pain Intervention(s): RN made aware;Repositioned;Ambulation/increased activity;Shower;Elevated extremity  See FIM for current functional status  Therapy/Group: Individual Therapy  [name], Gwinda Passe 07/07/2014, 12:36 PM

## 2014-07-07 NOTE — Progress Notes (Signed)
Social Work Patient ID: Christopher [name], male   DOB: 1926-09-16, 81 y.o.   MRN: [id number]   Lynnda Child, LCSW Social Worker Signed  Patient Care Conference 07/07/2014  1:11 PM    Expand All Collapse All   Inpatient RehabilitationTeam Conference and Plan of Care Update Date: 07/05/2014   Time: 2:15 PM     Patient Name: Christopher [name]       Medical Record Number: [id number]  Date of Birth: 1926-09-08 Sex: Male         Room/Bed: 4M11C/4M11C-01 Payor Info: Payor: MEDICARE / Plan: MEDICARE PART A AND B / Product Type: *No Product type* /    Admitting Diagnosis: RT HIP FX S P PINNNG   Admit Date/Time:  07/04/2014  6:08 PM Admission Comments: No comment available   Primary Diagnosis:  Fracture of femoral neck, right Principal Problem: Fracture of femoral neck, right    Patient Active Problem List     Diagnosis  Date Noted   .  Fracture of femoral neck, right     .  Intertrochanteric fracture of left hip     .  Closed right hip fracture  06/28/2014   .  CHF (congestive heart failure)  06/28/2014   .  Hip fracture  06/28/2014   .  Fall     .  Acute diastolic congestive heart failure, NYHA class 2  07/24/2013   .  Acute respiratory failure  07/24/2013   .  DM type 2 causing renal disease  07/24/2013   .  Shortness of breath  07/24/2013   .  Malignant neoplasm of prostate  07/16/2013   .  History of amiodarone therapy  07/16/2012   .  Atrial fibrillation  08/07/2011   .  Lumbar disc disease     .  Hyperlipidemia     .  Hypertensive heart disease without CHF     .  Kidney disease, chronic, stage III (GFR 30-59 ml/min)     .  Long-term (current) use of anticoagulants     .  BPH (benign prostatic hyperplasia)     .  Cardiac pacemaker in situ     .  CAD (coronary artery disease)  03/06/2010     Expected Discharge Date: Expected Discharge Date: 07/16/14  Team Members Present: Physician leading conference: Dr. Alger Simons Social Worker Present: Alfonse Alpers, LCSW Nurse Present:  Dorien Chihuahua, RN PT Present: Canary Brim, PT;Jess Anastasia Fiedler, PT (Napoleon Form, OT) OT Present: Willeen Cass, OT;Other (comment) SLP Present: Weston Anna, SLP Other (Discipline and Name): Danne Baxter, RN [facility]) PPS Coordinator present : Daiva Nakayama, RN, CRRN        Current Status/Progress  Goal  Weekly Team Focus   Medical     right femoral neck fracture, dm, obstipation  improve functional mobility  pain, ortho precautions   Bowel/Bladder     Continent to bowel and bladder.  To continue continent to bowel and bladder.   To monitor bladder and bowel function Q shift.    Swallow/Nutrition/ Hydration               ADL's     max A LB dressing; mod A functional transfers   Overall S  Sitting balance; functional activity tolerance; functional mobility and transfers   Mobility     min to mod A overall  S overall; min A car   endurance, strengthening,  ROM, gait, transfers, stairs, balance   Communication  Safety/Cognition/ Behavioral Observations              Pain     Rt. hip pain,using PRN Oxi IR 5 mg.    To keep pain levels less than 3.On scale 1 to 10.  To monitor pain levels Q 2 hrs. and response to activity.    Skin     Incision on Rt.hip with small foam.Two skin tears on Rt. arm with daily dressing change.  To keep skin free of breakdown.  To monitor the skin Q shift.    Rehab Goals Patient on target to meet rehab goals: Yes Rehab Goals Revised: none, pt's first conference *See Care Plan and progress notes for long and short-term goals.    Barriers to Discharge:  medication se, ortho issues     Possible Resolutions to Barriers:   mod I goals, modify meds     Discharge Planning/Teaching Needs:   Pt plans to return to his home to his wife who will provide 24/7 supervision  Pt's wife will be available for family education, as needed.  She has been present for therapies already.    Team Discussion:    Pt with hip fracture after a fall.  OT reported pt had a  BM today and they are questioning that medications may be making pt foggy.  PT reports pt's barriers are pain and getting started from sit to stand.  Otherwise, this was evaluation day and therapists will know more once they work with pt further.   Revisions to Treatment Plan:    None    Continued Need for Acute Rehabilitation Level of Care: The patient requires daily medical management by a physician with specialized training in physical medicine and rehabilitation for the following conditions: Daily direction of a multidisciplinary physical rehabilitation program to ensure safe treatment while eliciting the highest outcome that is of practical value to the patient.: Yes Daily medical management of patient stability for increased activity during participation in an intensive rehabilitation regime.: Yes Daily analysis of laboratory values and/or radiology reports with any subsequent need for medication adjustment of medical intervention for : Post surgical problems;Other  Amalee Olsen, Silvestre Mesi 07/07/2014, 1:11 PM

## 2014-07-07 NOTE — Progress Notes (Signed)
Physical Therapy Session Note  Patient Details  Name: Christopher [name] MRN: [id number] Date of Birth: 10-12-1926  Today's Date: 07/07/2014 PT Individual Time: 0900-1000 PT Individual Time Calculation (min): 60 min   Short Term Goals: Week 1:  PT Short Term Goal 1 (Week 1): Pt will perform functional transfers with min A PT Short Term Goal 2 (Week 1): Pt will be able to gait x 50' with min A PT Short Term Goal 3 (Week 1): Pt will be able to go up/down 2 steps with L rail for home entry with min A  Skilled Therapeutic Interventions/Progress Updates:   Supine to sit EOB with HOB elevated with S after pulling up pants in supine. From low surface of bed required light mod A for sit to stand and steady A to transfer with RW to the w/c. W/c propulsion for UE endurance and strengthening to therapy gym with S. Gait velocity test performed with result of 0.75 ft/sec indicating fall risk. Pt able to gait x 75' with min A and cues for upright posture. Stair negotiation up/down 4 steps (6") with L rail only with min A; L foot caught as descending second to last step but able to maintain balance with min A but required seated rest break after descending due to fatigue. Handout given for LE HEP and pt performed standing portion including standing hip flexion (marching), hip abduction, extension, and hamstring curls x 15 reps BLE with seated rest breaks between exercises. Handout also included supine exercises initiated the other day. Pt able to perform sit to stands with UE's pushing from chair throughout session with overall min A. Propelled back too room at end of session. Wife present to observe during session.   Therapy Documentation Precautions:  Precautions Precautions: Fall Precaution Comments: watch BP Restrictions Weight Bearing Restrictions: Yes RLE Weight Bearing: Weight bearing as tolerated    Pain: 8/10 pain in R hip with activity - premedicated.   See FIM for current functional  status  Therapy/Group: Individual Therapy  Canary Brim Ivory Broad, PT, DPT  07/07/2014, 11:10 AM

## 2014-07-07 NOTE — IPOC Note (Signed)
Overall Plan of Care [facility]) Patient Details Name: Christopher [name] MRN: [id number] DOB: 1926/10/07  Admitting Diagnosis: RT HIP FX S P [facility] Problems: Principal Problem:   Fracture of femoral neck, right Active Problems:   Atrial fibrillation   Hypertensive heart disease without CHF   Kidney disease, chronic, stage III (GFR 30-59 ml/min)     Functional Problem List: Nursing Bladder, Bowel, Endurance, Pain, Safety, Skin Integrity  PT Balance, Edema, Endurance, Motor, Pain, Skin Integrity  OT Balance, Endurance, Pain, Motor  SLP    TR         Basic ADL's: OT Bathing, Dressing, Toileting     Advanced  ADL's: OT       Transfers: PT Bed Mobility, Bed to Chair, Car, Manufacturing systems engineer, Metallurgist: PT Ambulation, Emergency planning/management officer, Stairs     Additional Impairments: OT    SLP        TR      Anticipated Outcomes Item Anticipated Outcome  Self Feeding Mod I  Swallowing      Basic self-care  Mod I  Insurance underwriter Transfers Supervision  Bowel/Bladder  Continent to bowel and bladder.  Transfers  S overall; min A car  Locomotion  S gait household distances; mod I w/c mobility  Communication     Cognition     Pain  Less than 3 on scale 1 to 10  Safety/Judgment  Free from fall during his stay in rehab.   Therapy Plan: PT Intensity: Minimum of 1-2 x/day ,45 to 90 minutes PT Frequency: 5 out of 7 days PT Duration Estimated Length of Stay: 10-14 days OT Intensity: Minimum of 1-2 x/day, 45 to 90 minutes OT Frequency: 5 out of 7 days OT Duration/Estimated Length of Stay: 10-14 days         Team Interventions: Nursing Interventions Patient/Family Education, Bladder Management, Bowel Management, Skin Care/Wound Management, Pain Management  PT interventions Ambulation/gait training, Training and development officer, Community reintegration, Discharge planning, Disease management/prevention, DME/adaptive equipment  instruction, Functional mobility training, Neuromuscular re-education, Pain management, Patient/family education, Psychosocial support, Skin care/wound management, Splinting/orthotics, Stair training, Therapeutic Activities, Therapeutic Exercise, UE/LE Strength taining/ROM, UE/LE Coordination activities, Wheelchair propulsion/positioning  OT Interventions Training and development officer, Discharge planning, Community reintegration, Engineer, drilling, Pain management, Patient/family education, Self Care/advanced ADL retraining, Therapeutic Activities, Therapeutic Exercise  SLP Interventions    TR Interventions    SW/CM Interventions Discharge Planning, Psychosocial Support, Patient/Family Education    Team Discharge Planning: Destination: PT-Home ,OT- Home , SLP-  Projected Follow-up: PT-Home health PT, 24 hour supervision/assistance, OT-  Home health OT, SLP-  Projected Equipment Needs: PT-Rolling walker with 5" wheels, Wheelchair (measurements), Wheelchair cushion (measurements), OT- To be determined, SLP-  Equipment Details: PT- , OT-  Patient/family involved in discharge planning: PT- Patient,  OT-Patient, SLP-   MD ELOS: 10-14 days Medical Rehab Prognosis:  Excellent Assessment: The patient has been admitted for CIR therapies with the diagnosis of right femoral neck fracture. The team will be addressing functional mobility, strength, stamina, balance, safety, adaptive techniques and equipment, self-care, bowel and bladder mgt, patient and caregiver education, ortho precautions, pain control, community reintegration, ego support. Goals have been set at supervision for mobility, self-care, ADL's. Christopher Staggers, MD, FAAPMR      See Team Conference Notes for weekly updates to the plan of care

## 2014-07-08 ENCOUNTER — Inpatient Hospital Stay (HOSPITAL_COMMUNITY): Payer: Medicare Other

## 2014-07-08 ENCOUNTER — Inpatient Hospital Stay (HOSPITAL_COMMUNITY): Payer: Medicare Other | Admitting: Occupational Therapy

## 2014-07-08 DIAGNOSIS — I482 Chronic atrial fibrillation: Secondary | ICD-10-CM

## 2014-07-08 LAB — CBC
HCT: 29.5 % — ABNORMAL LOW (ref 39.0–52.0)
HEMOGLOBIN: 9 g/dL — AB (ref 13.0–17.0)
MCH: 28.3 pg (ref 26.0–34.0)
MCHC: 30.5 g/dL (ref 30.0–36.0)
MCV: 92.8 fL (ref 78.0–100.0)
PLATELETS: 403 10*3/uL — AB (ref 150–400)
RBC: 3.18 MIL/uL — AB (ref 4.22–5.81)
RDW: 17.5 % — ABNORMAL HIGH (ref 11.5–15.5)
WBC: 10 10*3/uL (ref 4.0–10.5)

## 2014-07-08 LAB — GLUCOSE, CAPILLARY
GLUCOSE-CAPILLARY: 138 mg/dL — AB (ref 70–99)
Glucose-Capillary: 103 mg/dL — ABNORMAL HIGH (ref 70–99)
Glucose-Capillary: 109 mg/dL — ABNORMAL HIGH (ref 70–99)
Glucose-Capillary: 93 mg/dL (ref 70–99)

## 2014-07-08 LAB — PROTIME-INR
INR: 2.15 — ABNORMAL HIGH (ref 0.00–1.49)
Prothrombin Time: 24.2 seconds — ABNORMAL HIGH (ref 11.6–15.2)

## 2014-07-08 MED ORDER — WARFARIN SODIUM 5 MG PO TABS
5.0000 mg | ORAL_TABLET | Freq: Once | ORAL | Status: AC
  Administered 2014-07-08: 5 mg via ORAL
  Filled 2014-07-08: qty 1

## 2014-07-08 MED ORDER — OXYCODONE HCL 5 MG PO TABS
5.0000 mg | ORAL_TABLET | ORAL | Status: DC | PRN
  Administered 2014-07-08 – 2014-07-13 (×16): 10 mg via ORAL
  Filled 2014-07-08 (×17): qty 2

## 2014-07-08 NOTE — Progress Notes (Signed)
[place] PHYSICAL MEDICINE & REHABILITATION     PROGRESS NOTE    Subjective/Complaints: Pain gradually improving. Swelling RLE.  Pt denies nausea, vomiting, abdominal pain, diarrhea, chest pain, shortness of breath, palpitations, dizziness   Objective: Vital Signs: Blood pressure 116/55, pulse 64, temperature 98.1 F (36.7 C), temperature source Oral, resp. rate 18, weight 97.8 kg (215 lb 9.8 oz), SpO2 93 %. No results found.  Recent Labs  07/05/14 1231 07/08/14 0505  WBC 13.6* 10.0  HGB 9.9* 9.0*  HCT 31.8* 29.5*  PLT 402* 403*    Recent Labs  07/05/14 1231 07/07/14 0545  NA 139 142  K 5.2* 4.8  CL 106 108  GLUCOSE 130* 92  BUN 83* 74*  CREATININE 3.05* 2.65*  CALCIUM 8.9 8.8   CBG (last 3)   Recent Labs  07/07/14 1625 07/07/14 2105 07/08/14 0650  GLUCAP 97 147* 103*    Wt Readings from Last 3 Encounters:  07/08/14 97.8 kg (215 lb 9.8 oz)  07/04/14 112.7 kg (248 lb 7.3 oz)  07/28/13 101.6 kg (223 lb 15.8 oz)    Physical Exam:  Constitutional: He is oriented to person, place, and time. He appears well-developed and well-nourished.  HENT:  Head: Normocephalic and atraumatic.  Right Ear: External ear normal.  Left Ear: External ear normal.  Eyes: Conjunctivae are normal. Pupils are equal, round, and reactive to light.  Neck: Normal range of motion. Neck supple.  Cardiovascular: Normal rate and regular rhythm. Exam reveals no gallop and no friction rub.  No murmur heard. Respiratory: Effort normal and breath sounds normal. No respiratory distress. He has no wheezes. He has no rales.  GI: Soft. He exhibits distension. Bowel sounds are decreased. There is no tenderness.  Musculoskeletal:  1+ edema right hip and thigh, lower leg. Small incisions with foam dressing.  Neurological: He is alert and oriented to person, place, and time. No cranial nerve deficit. Coordination normal.  Right Hip and knee pain with lower extremity range of motion no pain  with ankle range of motion No pain with left lower extremity range of motion Upper extremity strength 5/5 bilateral deltoids, biceps, triceps, grip Left lower extremity 4/5 and hip flexor and extensor ankle dorsal flexion plantar flexion Trace right hip flexion knee extension 4 minus ankle dorsiflexion plantarflexion Skin: Skin is warm and dry. Incision clean. Right forearm tear clean.Psychiatric: He has a normal mood and affect. His behavior is normal.     Assessment/Plan: 1. Functional deficits secondary to right femoral neck fracture which require 3+ hours per day of interdisciplinary therapy in a comprehensive inpatient rehab setting. Physiatrist is providing close team supervision and 24 hour management of active medical problems listed below. Physiatrist and rehab team continue to assess barriers to discharge/monitor patient progress toward functional and medical goals. FIM: FIM - Bathing Bathing Steps Patient Completed: Chest, Right upper leg, Left upper leg, Right Arm, Left Arm, Abdomen, Front perineal area, Buttocks Bathing: 4: Min-Patient completes 8-9 30f 10 parts or 75+ percent  FIM - Upper Body Dressing/Undressing Upper body dressing/undressing steps patient completed: Thread/unthread right sleeve of pullover shirt/dresss, Thread/unthread left sleeve of pullover shirt/dress, Put head through opening of pull over shirt/dress Upper body dressing/undressing: 4: Min-Patient completed 75 plus % of tasks FIM - Lower Body Dressing/Undressing Lower body dressing/undressing steps patient completed: Pull pants up/down, Thread/unthread left pants leg, Thread/unthread right pants leg, Pull underwear up/down, Thread/unthread right underwear leg, Thread/unthread left underwear leg, Don/Doff right sock Lower body dressing/undressing: 4: Steadying Assist  FIM - Musician Devices: Grab bar or rail for support Toileting: 1: Total-Patient completed zero steps, helper did all  3  FIM - Radio producer Devices: Engineer, civil (consulting), Insurance account manager Transfers: 3-To toilet/BSC: Mod A (lift or lower assist), 3-From toilet/BSC: Mod A (lift or lower assist)  FIM - Control and instrumentation engineer Devices: Walker, Arm rests, Bed rails Bed/Chair Transfer: 3: Supine > Sit: Mod A (lifting assist/Pt. 50-74%/lift 2 legs, 3: Sit > Supine: Mod A (lifting assist/Pt. 50-74%/lift 2 legs), 4: Bed > Chair or W/C: Min A (steadying Pt. > 75%), 4: Chair or W/C > Bed: Min A (steadying Pt. > 75%)  FIM - Locomotion: Wheelchair Distance: 175 Locomotion: Wheelchair: 5: Travels 150 ft or more: maneuvers on rugs and over door sills with supervision, cueing or coaxing FIM - Locomotion: Ambulation Locomotion: Ambulation Assistive Devices: Administrator Ambulation/Gait Assistance: 4: Min assist Locomotion: Ambulation: 2: Travels 50 - 149 ft with minimal assistance (Pt.>75%)  Comprehension Comprehension Mode: Auditory Comprehension: 5-Understands complex 90% of the time/Cues < 10% of the time  Expression Expression Mode: Verbal Expression: 5-Expresses complex 90% of the time/cues < 10% of the time  Social Interaction Social Interaction: 6-Interacts appropriately with others with medication or extra time (anti-anxiety, antidepressant).  Problem Solving Problem Solving: 5-Solves basic problems: With no assist  Memory Memory: 6-More than reasonable amt of time   Medical Problem List and Plan: 1. Functional deficits secondary to right femoral neck fracture after fall s/p pinning 2. DVT Prophylaxis/Anticoagulation: Pharmaceutical: Lovenox until coumadin therapeutic  -elevate RLE when in bed/chair 3. Pain Management:     oxycodone prn. 4. Mood: Motivated to get better. LCSW to follow for evaluation and support.  5. Neuropsych: This patient is capable of making decisions on his own behalf. 6. Skin/Wound Care: Routine pressure relief measures.  local care.  7. Fluids/Electrolytes/Nutrition: Monitor I/O. Encourage patient to push fluids.  8. DM type 2:   Will monitor BS with ac/hs checks. Resumed amaryl   -sugars reasonable at present 9. A fib: Will monitor HR every 8 hours. Continue amiodarone and atenolol for rate control.  10. Constipation:   results with supp and enema 11. HTN: Monitor BP every 8 hours. Continue lasix 40 mg daily as well as proscar and tenormin. 12. Acute on chronic anemia:   iron supplement.  13. Leukocytosis: no obvious source. Denies urinary symptoms. Wound looks good. Lungs clear  -wbc's improved today  LOS (Days) 4 A FACE TO FACE EVALUATION WAS PERFORMED  Christopher [name] 07/08/2014 8:17 AM

## 2014-07-08 NOTE — Progress Notes (Signed)
Physical Therapy Session Note  Patient Details  Name: Christopher [name] MRN: [id number] Date of Birth: 1926-09-25  Today's Date: 07/08/2014 PT Individual Time: 0900-1000 PT Individual Time Calculation (min): 60 min   Short Term Goals: Week 1:  PT Short Term Goal 1 (Week 1): Pt will perform functional transfers with min A PT Short Term Goal 2 (Week 1): Pt will be able to gait x 50' with min A PT Short Term Goal 3 (Week 1): Pt will be able to go up/down 2 steps with L rail for home entry with min A  Skilled Therapeutic Interventions/Progress Updates:    Pt up and ready for therapy. Pt and wife asking to be checked off on toilet transfers with RW. Had pt and wife demonstrate gait into bathroom with RW, transfer on and off toilet and gait back at close S to steady A level with instructional cues to maintain RW closer to body during gait. Overall demonstrated successfully and checked pt and wife off on safety plan to perform toilet transfers without staff present. Educated that this is to only be done with wife present and if pt with increased pain or fatigue to still call nursing staff. Both verbalized understanding and in agreement.  Pt demonstrating overall improvement with transfers performing most at overall S level with cues for upright posture. Gait training with RW with focus on gait pattern, upright posture, and correct positioning of body inside RW x 70' with steady A/close S. Simulated car transfer to low sedan with steady A using RW with wife present to observe and provide appropriate cues. W/c propulsion in between for general UE strengthening and endurance mod I. Nustep for functional strengthening, increasing hip ROM, and overall endurance x 10 min on level 6 (pt likes this machine from h/o cardiac rehab). Standing therex for BLE strengthening and ROM including standing hip abduction, hamstring curls, marching, and hip extension x 10 reps each.   Discussed possibility of moving up d/c date  from next Sat to middle of next week if rest of team agrees as patient is making excellent progress. Pt and wife open and in agreement to this as well.  Therapy Documentation Precautions:  Precautions Precautions: Fall Precaution Comments: watch BP Restrictions Weight Bearing Restrictions: Yes RLE Weight Bearing: Weight bearing as tolerated  Pain: 5/10 pain in RLE - premedicated.   See FIM for current functional status  Therapy/Group: Individual Therapy  Canary Brim Ivory Broad, PT, DPT  07/08/2014, 12:14 PM

## 2014-07-08 NOTE — Progress Notes (Signed)
Occupational Therapy Session Note  Patient Details  Name: Christopher [name] MRN: [id number] Date of Birth: [birth year]  Today's Date: 07/08/2014 OT Individual Time: 1430-1530 OT Individual Time Calculation (min): 60 min    Short Term Goals: Week 1:  OT Short Term Goal 1 (Week 1): Pt will complete toilet transfers with min A OT Short Term Goal 2 (Week 1): Pt will complete LB dressing with min A OT Short Term Goal 3 (Week 1): Pt will tolerate 5 minutes of standing with min A to complete grooming task OT Short Term Goal 4 (Week 1): Pt will complete toileting task with mod A  Skilled Therapeutic Interventions/Progress Updates:    Pt seen for OT session focusing on functional activity tolerance, UE strengthening/ ROM, and functional transfers. Pt in w/[name] upon arrival, voicing 10/10 pain, however, agreeable to w/[name] level therapy session due to pain. RN and PA made aware of pt's pain, and medication administered by RN. Pt self-propelled w/[name] throughout unit to 3rd floor solarium navigating environmental obstacles and over uneven/ different terrain with overall min A for w/[name] management. Pt voiced discomfort on tail bone when sitting in w/[name]. Pt stood at table side and towels placed on B sides under each butt cheek on w/[name] pad in order to eleviate pressure on tailbone, pt voiced increased comfort following repositioning. Pt completed sit <> stands with supervision. Pt then completed UE strengthening exercises using level 1 thera-band. Pt able to recall 2/3 exercises taught in previous session as part of HEP. Pt completed x10 reps of UE strengthening exercies in each plane with verbal and tactile cues for proper positioning and technique. Pt returned to room total A due to fatigue. Pt voiced desire to practice toilet transfer without the use of BSC. Pt ambulated into bathroom with supervision and RW. Pt required verbal cues for technique for controlled descent onto low surface toilet. Min A required to stand from toilet  with use of grab bars. Education provided regarding use of 3-1 BSC upon d/[name] for use at bed side at night and over the toilet during the day. Pt voiced agreement for [facility] upon d/[name] and desire to keep practicing standard toilet transfers during therapy tx session. Pt returned to bed with supervision, and completed bed mobility with use of leg lifter and VCs for technique.   Pt left in supine with all needs in reach, wife and NT present.   Therapy Documentation Precautions:  Precautions Precautions: Fall Precaution Comments: watch BP Restrictions Weight Bearing Restrictions: Yes RLE Weight Bearing: Weight bearing as tolerated Pain: Pain Assessment Pain Assessment: 0-10 Pain Score: 8  Pain Type: Surgical pain Pain Location: Hip Pain Orientation: Right Pain Descriptors / Indicators: Aching;Stabbing Pain Frequency: Constant Pain Onset: On-going Pain Intervention(s): Medication (See eMAR) Multiple Pain Sites: Yes 2nd Pain Site Pain Score: 7 Pain Type: Chronic pain Pain Location: Sacrum Pain Descriptors / Indicators: Aching Pain Frequency: Intermittent Pain Intervention(s): Repositioned;Medication (See eMAR)  See FIM for current functional status  Therapy/Group: Individual Therapy  Christopher [name] 07/08/2014, 3:31 PM

## 2014-07-08 NOTE — Progress Notes (Signed)
ANTICOAGULATION CONSULT NOTE - Follow Up Consult  Pharmacy Consult for coumadin Indication: atrial fibrillation  Allergies  Allergen Reactions  . Crestor [Rosuvastatin] Other (See Comments)    Aches in joint   . Lipitor [Atorvastatin] Other (See Comments)    Joint aching  . Penicillins Rash  . Tramadol Nausea Only  . Vicodin [Hydrocodone-Acetaminophen] Anxiety    "Made me nervous"    Patient Measurements: Weight: 215 lb 9.8 oz (97.8 kg) Heparin Dosing Weight:   Vital Signs: Temp: 98.1 F (36.7 C) (04/29 0616) Temp Source: Oral (04/29 0616) BP: 119/60 mmHg (04/29 0829) Pulse Rate: 68 (04/29 0829)  Labs:  Recent Labs  07/05/14 1231 07/06/14 0705 07/07/14 0545 07/08/14 0505  HGB 9.9*  --   --  9.0*  HCT 31.8*  --   --  29.5*  PLT 402*  --   --  403*  LABPROT 18.5* 20.0* 22.0* 24.2*  INR 1.53* 1.68* 1.91* 2.15*  CREATININE 3.05*  --  2.65*  --     Estimated Creatinine Clearance: 23.7 mL/min (by C-G formula based on Cr of 2.65).   Medications:  Scheduled:  . amiodarone  200 mg Oral q morning - 10a  . atenolol  25 mg Oral Q breakfast  . enoxaparin (LOVENOX) injection  30 mg Subcutaneous Q24H  . finasteride  5 mg Oral Daily  . folic acid  374 mcg Oral Daily  . furosemide  40 mg Oral Daily  . glimepiride  1 mg Oral Q breakfast  . insulin aspart  0-9 Units Subcutaneous TID WC  . iron polysaccharides  150 mg Oral BID AC  . multivitamin with minerals  1 tablet Oral Daily  . omega-3 acid ethyl esters  1 g Oral Daily  . Warfarin - Pharmacist Dosing Inpatient   Does not apply q1800   Infusions:    Assessment: 81 yo male with afib is currently on therapeutic coumadin.  INR is rising nicely to 2.15 today. Patient is also on lovenox 30mg  sq q24h. Hgb 9 and Plt 403 K  Goal of Therapy:  INR 2-3 Monitor platelets by anticoagulation protocol: Yes   Plan:  - Coumadin 5 mg PO once today.  - Discontinue Lovenox 30mg  SQ Q24H since INR is therapeutic - Daily PT /  INR  Maryanna Shape, PharmD, BCPS  Clinical Pharmacist  Pager: [phone]   07/08/2014,12:19 PM

## 2014-07-08 NOTE — Progress Notes (Signed)
Occupational Therapy Session Note  Patient Details  Name: Christopher [name] MRN: [id number] Date of Birth: 07-Oct-1926  Today's Date: 07/08/2014 OT Individual Time: 1000-1100 OT Individual Time Calculation (min): 60 min    Short Term Goals: Week 1:  OT Short Term Goal 1 (Week 1): Pt will complete toilet transfers with min A OT Short Term Goal 2 (Week 1): Pt will complete LB dressing with min A OT Short Term Goal 3 (Week 1): Pt will tolerate 5 minutes of standing with min A to complete grooming task OT Short Term Goal 4 (Week 1): Pt will complete toileting task with mod A  Skilled Therapeutic Interventions/Progress Updates:    Pt in w/c upon arrival, having just finished with PT. Agreeable to tx. Pt declined bathing and dressing task, stating his wife got him ready this morning. Therapist educated on the importance of independence and participation with self-care tasks. Pt taken to therapy gym total A for energy conservation. Pt transferred w/c> therapy mat with close supervision and RW.  Pt provided with leg lifter to assist with EOB> supine transfers.  Pt demonstrated ability to complete sitting on EOM> supine with use of leg lifter with cur technique. Pt completed transferes x2 reps for pracetice. Pt then completed step up onto 4 inch block from seated level x 10 reps. Pt then completed x2 step ups from standing position, however, folliwing x 3 reps of step ups/down, pt voiced he could no longer tolerate the pain and requested seated rest break.  Pt then transferred to supine on mat with supervision with use of leg lifter. In supine, pt completed UE strengthening/ ROM exercises including shoulder AB/ADduction, elbow flexion/extension, and internal/external rotation. Exercises completed with 3# weighted ball and then again with level I thera-band as introduction to HEP. Pt agreeable to attempt static standing task following rest. Pt stood at RW and engaged in jig saw puzzle from table top level,  tolerating ~4 minutes of static standing before requesting seated rest break. Pt then returned to w/c with supervision and RW. Pt returned to room in w/c total A due to fatigue. Pt ambulated within room and returned to EOB with supervision. Pt used leg lifter to transfer into bed with supervision and VCs for technique. Pt with increased independence with bed mobility with use of leg lifter, stating "this thing is a life saver!". Pt left in supine, all needs in reach. RN made aware of pt's ongoing pain at end of session.    Education provided regarding importance of independence with ADLs, use of leg lifter, UE strengthening/ ROM HEP, and d/c planning.   Therapy Documentation Precautions:  Precautions Precautions: Fall Precaution Comments: watch BP Restrictions Weight Bearing Restrictions: Yes RLE Weight Bearing: Weight bearing as tolerated Pain: Pain Assessment Pain Assessment: 0-10 Pain Score: 8  Pain Type: Surgical pain Pain Location: Hip Pain Orientation: Right Pain Frequency: Constant Pain Onset: On-going Pain Intervention(s): Medication (See eMAR)  See FIM for current functional status  Therapy/Group: Individual Therapy  [name], Burt Piatek C 07/08/2014, 10:18 AM

## 2014-07-08 NOTE — Progress Notes (Signed)
Physical Therapy Session Note  Patient Details  Name: Christopher [name] MRN: [id number] Date of Birth: [birth year]  Today's Date: 07/08/2014 PT Individual Time: 1300-1330 PT Individual Time Calculation (min): 30 min   Short Term Goals: Week 1:  PT Short Term Goal 1 (Week 1): Pt will perform functional transfers with min A PT Short Term Goal 2 (Week 1): Pt will be able to gait x 50' with min A PT Short Term Goal 3 (Week 1): Pt will be able to go up/down 2 steps with L rail for home entry with min A  Skilled Therapeutic Interventions/Progress Updates:    Session focused on functional sit to stands with RW (min A), gait training with RW for strengthening,endurance, and overall functional mobility training, and education on continued activity post d/c to further promote recovery. Pt with increased pain this PM session but able to tolerate session well. Min guard for gait with cues for gait pattern, posture, and placement of RW x 110', x 50' and x 50' with seated rest breaks between trials. Positioned in w/c with ice pack to R hip at end of session with all needs in reach.   Therapy Documentation Precautions:  Precautions Precautions: Fall Precaution Comments: watch BP Restrictions Weight Bearing Restrictions: Yes RLE Weight Bearing: Weight bearing as tolerated   Pain: Pain Assessment Pain Assessment: 0-10 Pain Score: 7  Pain Type: Surgical pain Pain Location: Hip Pain Orientation: Right Pain Descriptors / Indicators: Aching;Stabbing Pain Frequency: Constant Pain Onset: On-going Pain Intervention(s): Medication (See eMAR) Locomotion : Ambulation Ambulation/Gait Assistance: 4: Min guard   See FIM for current functional status  Therapy/Group: Individual Therapy  Canary Brim [facility] 07/08/2014, 1:31 PM

## 2014-07-09 ENCOUNTER — Inpatient Hospital Stay (HOSPITAL_COMMUNITY): Payer: Medicare Other | Admitting: Occupational Therapy

## 2014-07-09 ENCOUNTER — Inpatient Hospital Stay (HOSPITAL_COMMUNITY): Payer: Medicare Other | Admitting: Physical Therapy

## 2014-07-09 DIAGNOSIS — M533 Sacrococcygeal disorders, not elsewhere classified: Secondary | ICD-10-CM

## 2014-07-09 LAB — GLUCOSE, CAPILLARY
Glucose-Capillary: 104 mg/dL — ABNORMAL HIGH (ref 70–99)
Glucose-Capillary: 111 mg/dL — ABNORMAL HIGH (ref 70–99)
Glucose-Capillary: 121 mg/dL — ABNORMAL HIGH (ref 70–99)
Glucose-Capillary: 119 mg/dL — ABNORMAL HIGH (ref 70–99)

## 2014-07-09 LAB — PROTIME-INR
INR: 2.44 — ABNORMAL HIGH (ref 0.00–1.49)
Prothrombin Time: 26.7 seconds — ABNORMAL HIGH (ref 11.6–15.2)

## 2014-07-09 MED ORDER — WARFARIN SODIUM 4 MG PO TABS
4.0000 mg | ORAL_TABLET | Freq: Every day | ORAL | Status: DC
  Administered 2014-07-09 – 2014-07-12 (×4): 4 mg via ORAL
  Filled 2014-07-09 (×5): qty 1

## 2014-07-09 NOTE — Progress Notes (Signed)
Occupational Therapy Session Note  Patient Details  Name: Christopher [name] MRN: [id number] Date of Birth: [birth year]  Today's Date: 07/09/2014 OT Individual Time: 1100-1200 and 1335-1415 OT Individual Time Calculation (min): 60 min and 40 min   Short Term Goals: Week 1:  OT Short Term Goal 1 (Week 1): Pt will complete toilet transfers with min A OT Short Term Goal 2 (Week 1): Pt will complete LB dressing with min A OT Short Term Goal 3 (Week 1): Pt will tolerate 5 minutes of standing with min A to complete grooming task OT Short Term Goal 4 (Week 1): Pt will complete toileting task with mod A      Skilled Therapeutic Interventions/Progress Updates:    Visit 1: No c/o pain. (premedicated) Pt seen for BADL retraining of grooming, toileting, and dressing with use of AE. Pt's wife present for the entire session. Pt declined bathing this morning. Pt was in bed and able to sit to EOB with S and ambulated to toilet with S. Stood at sink for 5-6 min to groom. Worked on LB dressing with reacher, sock aid, shoe horn and button aid to dress with close S. He has been able to do some dressing without the equipment but is trying to avoid back pain with bending forward.  An appleboard placed under his w/c cushion to provide more support as his cushion in a sling shape.  Pt worked on walking with RW for 8 min prior to feeling fatigued. Pt propelled himself to his room. Pt in room with wife, all needs met.  Visit 2: 3/10 pain - premedicated Pt seen this session for functional mobility and activity tolerance. Pt donned shoes with shoe horn and fastened laces with shoe buttons independently. Pt ambulated to and from bathroom with S, standing at sink for handwashing.  Pt began to feel hot and needed a few minutes of rest.  Pt then ambulated 50 feet toward gym 2x with short rest break in between.  Pt continued to propel self to gym. Pt worked on UE strengthening exercises with 4lb dowel with 3 shoulder/elbow exercises,  3 sets. Pt's PT arrived for next session.    Therapy Documentation Precautions:  Precautions Precautions: Fall Precaution Comments: watch BP Restrictions Weight Bearing Restrictions: Yes RLE Weight Bearing: Weight bearing as tolerated     ADL:  See FIM for current functional status  Therapy/Group: Individual Therapy  SAGUIER,JULIA 07/09/2014, 12:50 PM

## 2014-07-09 NOTE — Progress Notes (Signed)
ANTICOAGULATION CONSULT NOTE - Follow Up Consult  Pharmacy Consult for coumadin Indication: atrial fibrillation  Allergies  Allergen Reactions  . Crestor [Rosuvastatin] Other (See Comments)    Aches in joint   . Lipitor [Atorvastatin] Other (See Comments)    Joint aching  . Penicillins Rash  . Tramadol Nausea Only  . Vicodin [Hydrocodone-Acetaminophen] Anxiety    "Made me nervous"    Patient Measurements: Weight: 212 lb 11.9 oz (96.5 kg) Heparin Dosing Weight:   Vital Signs: Temp: 97.8 F (36.6 C) (04/30 0521) Temp Source: Oral (04/30 0521) BP: 116/54 mmHg (04/30 0521) Pulse Rate: 68 (04/30 0521)  Labs:  Recent Labs  07/07/14 0545 07/08/14 0505 07/09/14 0613  HGB  --  9.0*  --   HCT  --  29.5*  --   PLT  --  403*  --   LABPROT 22.0* 24.2* 26.7*  INR 1.91* 2.15* 2.44*  CREATININE 2.65*  --   --     Estimated Creatinine Clearance: 23.7 mL/min (by C-G formula based on Cr of 2.65).   Medications:  Scheduled:  . amiodarone  200 mg Oral q morning - 10a  . atenolol  25 mg Oral Q breakfast  . finasteride  5 mg Oral Daily  . folic acid  035 mcg Oral Daily  . furosemide  40 mg Oral Daily  . glimepiride  1 mg Oral Q breakfast  . insulin aspart  0-9 Units Subcutaneous TID WC  . iron polysaccharides  150 mg Oral BID AC  . multivitamin with minerals  1 tablet Oral Daily  . omega-3 acid ethyl esters  1 g Oral Daily  . Warfarin - Pharmacist Dosing Inpatient   Does not apply q1800   Infusions:    Assessment: 81 yo male with afib is currently on therapeutic coumadin.  INR 2.44  Last labs Hgb 9 and Plt 403 K  Goal of Therapy:  INR 2-3 Monitor platelets by anticoagulation protocol: Yes   Plan:  - Coumadin 4 mg PO daily - Daily PT / INR  Thanks for allowing pharmacy to be a part of this patient's care.  Excell Seltzer, PharmD Clinical Pharmacist, [phone]   07/09/2014,8:18 AM

## 2014-07-09 NOTE — Progress Notes (Signed)
Physical Therapy Session Note  Patient Details  Name: Christopher [name] MRN: [id number] Date of Birth: December 06, 1926  Today's Date: 07/09/2014 PT Individual Time: 0900-1000 Treatment Session 2: 1415-1445 PT Individual Time Calculation (min): 60 min Treatment Session 2: 30 min  Short Term Goals: Week 1:  PT Short Term Goal 1 (Week 1): Pt will perform functional transfers with min A PT Short Term Goal 2 (Week 1): Pt will be able to gait x 50' with min A PT Short Term Goal 3 (Week 1): Pt will be able to go up/down 2 steps with L rail for home entry with min A  Skilled Therapeutic Interventions/Progress Updates:    Treatment Session 1: Gait Training: PT instructs pt in ambulation with RW x 150' req SBA and then a seated rest break - repeated verbal cues to stand tall and then pt ambulates another 69' to get to the gym with RW and SBA.   Therapeutic Exercise: PT obtains pt's printed HEP and instructs pt in reading this to guide him in ROM and strengthening exercises: ankle pumps, isometric knee extension x 5 second holds, heel slides, SAQ with bolster under knee x 5 second holds, supine hip abduction/adduction with AAROM to R leg, one-leg bridge with PT stabilizing foot on the ground, LAQ with 5 second hold: x 10 reps to each LE.   Therapeutic Activity: Pt received sitting edge of bed with non-skid hospital socks on, requesting to put shoes on prior to ambulation. PT obtains a reacher and long handled shoe horn and instructs pt in use of this AE. Pt demonstrates ability to doff non-skid socks, don shoes, and lean forward and tie them with AE. PT dons knee high orthotic TEDs, per pt request.  PT instructs pt in hand placement for sit to stand from EOB req close SBA for safety and verbal cues for hand placement, and SBA with RW transfer stand-step bed  To w/c transfer.  PT instructs pt in sit to supine transfer on mat, using hands to assist leg onto bed req SBA.  PT instructs pt in supine to sit transfer  on mat req SBA for safety through modified long sit position and pt assisting R leg with UEs.   Treatment Session 2: Gait Training: PT instructs pt in ascending 12 stairs with 2 hands on L rail, progressing to one hand on L/one hand on R rail, as this is what pt will have at home, req min A: up with strong, down with weak.   Therapeutic Exercise: PT instructs pt in standing exercises at counter top: marching, hip abduction, hip extension, and knee flexion: x 10 reps each LE.  Pt's SaO2 drops to 84% after completing 12 stairs, but in a minute with seated rest, it returns to 95% on RA. Pt is progressing with functional mobility - endurance continues to be limited. Continue per PT POC.    Therapy Documentation Precautions:  Precautions Precautions: Fall Precaution Comments: watch BP Restrictions Weight Bearing Restrictions: Yes RLE Weight Bearing: Weight bearing as tolerated Pain: Pain Assessment Pain Assessment: 0-10 Pain Score: 9  Pain Type: Surgical pain Pain Location: Hip Pain Orientation: Right Pain Descriptors / Indicators: Aching Pain Frequency: Constant Pain Onset: On-going Pain Intervention(s): Medication (See eMAR) Multiple Pain Sites: No Treatment Session 2: Pt c/o 5/10 pain in R hip during exercises; Pt req prolonged rest for pain to decrease.    See FIM for current functional status  Therapy/Group: Individual Therapy  Sohan Potvin M 07/09/2014, 7:57 AM

## 2014-07-09 NOTE — Progress Notes (Signed)
[place] PHYSICAL MEDICINE & REHABILITATION     PROGRESS NOTE    Subjective/Complaints: Tailbone pain since the 1980s, "canyou check my skin?" No fall to that area during hip fx per pt  \ROS + constipation o/w -   Objective: Vital Signs: Blood pressure 116/54, pulse 68, temperature 97.8 F (36.6 C), temperature source Oral, resp. rate 18, weight 96.5 kg (212 lb 11.9 oz), SpO2 95 %. No results found.  Recent Labs  07/08/14 0505  WBC 10.0  HGB 9.0*  HCT 29.5*  PLT 403*    Recent Labs  07/07/14 0545  NA 142  K 4.8  CL 108  GLUCOSE 92  BUN 74*  CREATININE 2.65*  CALCIUM 8.8   CBG (last 3)   Recent Labs  07/08/14 1659 07/08/14 2059 07/09/14 0643  GLUCAP 93 138* 104*    Wt Readings from Last 3 Encounters:  07/09/14 96.5 kg (212 lb 11.9 oz)  07/04/14 112.7 kg (248 lb 7.3 oz)  07/28/13 101.6 kg (223 lb 15.8 oz)    Physical Exam:  Constitutional: He is oriented to person, place, and time. He appears well-developed and well-nourished.  HENT:  Head: Normocephalic and atraumatic.  Right Ear: External ear normal.  Left Ear: External ear normal.  Eyes: Conjunctivae are normal. Pupils are equal, round, and reactive to light.  Neck: Normal range of motion. Neck supple.  Cardiovascular: Normal rate and regular rhythm. Exam reveals no gallop and no friction rub.  No murmur heard. Respiratory: Effort normal and breath sounds normal. No respiratory distress. He has no wheezes. He has no rales.  GI: Soft. He exhibits distension. Bowel sounds are decreased. There is no tenderness.  Musculoskeletal:  1+ edema right hip and thigh, lower leg. Small incisions with foam dressing.  Neurological: He is alert and oriented to person, place, and time. No cranial nerve deficit. Coordination normal.  Right Hip and knee pain with lower extremity range of motion no pain with ankle range of motion No pain with left lower extremity range of motion Upper extremity strength 5/5  bilateral deltoids, biceps, triceps, grip Left lower extremity 4/5 and hip flexor and extensor ankle dorsal flexion plantar flexion  Skin: Skin is warm and dry. Sacral area CDI no erythema  .Psychiatric: He has a normal mood and affect. His behavior is normal.     Assessment/Plan: 1. Functional deficits secondary to right femoral neck fracture which require 3+ hours per day of interdisciplinary therapy in a comprehensive inpatient rehab setting. Physiatrist is providing close team supervision and 24 hour management of active medical problems listed below. Physiatrist and rehab team continue to assess barriers to discharge/monitor patient progress toward functional and medical goals. FIM: FIM - Bathing Bathing Steps Patient Completed: Chest, Right upper leg, Left upper leg, Right Arm, Left Arm, Abdomen, Front perineal area, Buttocks Bathing: 4: Min-Patient completes 8-9 59f 10 parts or 75+ percent  FIM - Upper Body Dressing/Undressing Upper body dressing/undressing steps patient completed: Thread/unthread right sleeve of pullover shirt/dresss, Thread/unthread left sleeve of pullover shirt/dress, Put head through opening of pull over shirt/dress Upper body dressing/undressing: 4: Min-Patient completed 75 plus % of tasks FIM - Lower Body Dressing/Undressing Lower body dressing/undressing steps patient completed: Pull pants up/down, Thread/unthread left pants leg, Thread/unthread right pants leg, Pull underwear up/down, Thread/unthread right underwear leg, Thread/unthread left underwear leg, Don/Doff right sock Lower body dressing/undressing: 4: Steadying Assist  FIM - Toileting Toileting steps completed by patient: Adjust clothing prior to toileting, Adjust clothing after toileting Toileting Assistive  Devices: Grab bar or rail for support Toileting: 5: Supervision: Safety issues/verbal cues  FIM - Radio producer Devices: Insurance account manager Transfers: 5-To  toilet/BSC: Supervision (verbal cues/safety issues), 4-From toilet/BSC: Min A (steadying Pt. > 75%)  FIM - Bed/Chair Transfer Bed/Chair Transfer Assistive Devices: Walker, Arm rests Bed/Chair Transfer: 5: Sit > Supine: Supervision (verbal cues/safety issues), 5: Bed > Chair or W/C: Supervision (verbal cues/safety issues), 5: Chair or W/C > Bed: Supervision (verbal cues/safety issues)  FIM - Locomotion: Wheelchair Distance: 175 Locomotion: Wheelchair: 5: Travels 50 - 149 ft, turns around, maneuvers to table, bed or toilet, negotiates 3% grade: modified independent FIM - Locomotion: Ambulation Locomotion: Ambulation Assistive Devices: Administrator Ambulation/Gait Assistance: 4: Min guard Locomotion: Ambulation: 2: Travels 50 - 149 ft with minimal assistance (Pt.>75%)  Comprehension Comprehension Mode: Auditory Comprehension: 5-Understands complex 90% of the time/Cues < 10% of the time  Expression Expression Mode: Verbal Expression: 5-Expresses complex 90% of the time/cues < 10% of the time  Social Interaction Social Interaction: 6-Interacts appropriately with others with medication or extra time (anti-anxiety, antidepressant).  Problem Solving Problem Solving: 5-Solves basic problems: With no assist  Memory Memory: 6-More than reasonable amt of time   Medical Problem List and Plan: 1. Functional deficits secondary to right femoral neck fracture after fall s/p pinning 2. DVT Prophylaxis/Anticoagulation: Pharmaceutical: Lovenox until coumadin therapeutic  -elevate RLE when in bed/chair 3. Pain Management:     oxycodone prn. Chronic coccydynia, no skin abreakdown 4. Mood: Motivated to get better. LCSW to follow for evaluation and support.  5. Neuropsych: This patient is capable of making decisions on his own behalf. 6. Skin/Wound Care: Routine pressure relief measures. local care.  7. Fluids/Electrolytes/Nutrition: Monitor I/O. Encourage patient to push fluids.  8. DM type  2:   Will monitor BS with ac/hs checks. Resumed amaryl   -sugars reasonable at present 9. A fib: Will monitor HR every 8 hours. Continue amiodarone and atenolol for rate control.  10. Constipation:   results with supp and enema 11. HTN: Monitor BP every 8 hours. Continue lasix 40 mg daily as well as proscar and tenormin. 12. Acute on chronic anemia:   iron supplement.  13. Leukocytosis: no obvious source. Denies urinary symptoms. Wound looks good. Lungs clear  -wbc's improved today 14.  Constipation adjust bowel program LOS (Days) 5 A FACE TO FACE EVALUATION WAS PERFORMED  KIRSTEINS,ANDREW E 07/09/2014 8:21 AM

## 2014-07-09 NOTE — Progress Notes (Signed)
Social Work Patient ID: Christopher [name], male   DOB: Jan 04, 1927, 80 y.o.   MRN: [id number]   CSW met with pt and his wife on 07-06-14 to update them on team conference discussion as well as to complete assessment.  They were pleased to learn of d/c date and wife feels she can care for pt at home.  Explained that pt's goals are supervision and she is comfortable with this.  CSW will continue to follow and assist with pt's d/c plan as needed.

## 2014-07-10 ENCOUNTER — Inpatient Hospital Stay (HOSPITAL_COMMUNITY): Payer: Medicare Other

## 2014-07-10 LAB — GLUCOSE, CAPILLARY
GLUCOSE-CAPILLARY: 105 mg/dL — AB (ref 70–99)
GLUCOSE-CAPILLARY: 130 mg/dL — AB (ref 70–99)
Glucose-Capillary: 90 mg/dL (ref 70–99)
Glucose-Capillary: 92 mg/dL (ref 70–99)

## 2014-07-10 LAB — PROTIME-INR
INR: 2.6 — ABNORMAL HIGH (ref 0.00–1.49)
Prothrombin Time: 28.1 seconds — ABNORMAL HIGH (ref 11.6–15.2)

## 2014-07-10 MED ORDER — SENNOSIDES-DOCUSATE SODIUM 8.6-50 MG PO TABS
2.0000 | ORAL_TABLET | Freq: Two times a day (BID) | ORAL | Status: DC
  Administered 2014-07-10 – 2014-07-13 (×6): 2 via ORAL
  Filled 2014-07-10 (×6): qty 2

## 2014-07-10 NOTE — Progress Notes (Signed)
ANTICOAGULATION CONSULT NOTE - Follow Up Consult  Pharmacy Consult for coumadin Indication: atrial fibrillation  Allergies  Allergen Reactions  . Crestor [Rosuvastatin] Other (See Comments)    Aches in joint   . Lipitor [Atorvastatin] Other (See Comments)    Joint aching  . Penicillins Rash  . Tramadol Nausea Only  . Vicodin [Hydrocodone-Acetaminophen] Anxiety    "Made me nervous"    Patient Measurements: Weight: 213 lb 10 oz (96.9 kg) Heparin Dosing Weight:   Vital Signs: Temp: 98.7 F (37.1 C) (05/01 0525) Temp Source: Oral (05/01 0525) BP: 118/57 mmHg (05/01 0525) Pulse Rate: 76 (05/01 0525)  Labs:  Recent Labs  07/08/14 0505 07/09/14 9417 07/10/14 1130  HGB 9.0*  --   --   HCT 29.5*  --   --   PLT 403*  --   --   LABPROT 24.2* 26.7* 28.1*  INR 2.15* 2.44* 2.60*    Estimated Creatinine Clearance: 23.7 mL/min (by C-G formula based on Cr of 2.65).   Medications:  Scheduled:  . amiodarone  200 mg Oral q morning - 10a  . atenolol  25 mg Oral Q breakfast  . finasteride  5 mg Oral Daily  . folic acid  408 mcg Oral Daily  . furosemide  40 mg Oral Daily  . glimepiride  1 mg Oral Q breakfast  . insulin aspart  0-9 Units Subcutaneous TID WC  . iron polysaccharides  150 mg Oral BID AC  . multivitamin with minerals  1 tablet Oral Daily  . omega-3 acid ethyl esters  1 g Oral Daily  . senna-docusate  2 tablet Oral BID  . warfarin  4 mg Oral q1800  . Warfarin - Pharmacist Dosing Inpatient   Does not apply q1800   Infusions:    Assessment: 81 yo male with afib is currently on therapeutic coumadin.  INR 2.6 Last labs Hgb 9 and Plt 403 K  Goal of Therapy:  INR 2-3 Monitor platelets by anticoagulation protocol: Yes   Plan:  - Coumadin 4 mg PO daily - Daily PT / INR  Thanks for allowing pharmacy to be a part of this patient's care.  Excell Seltzer, PharmD Clinical Pharmacist, [phone]   07/10/2014,12:47 PM

## 2014-07-10 NOTE — Progress Notes (Signed)
Physical Therapy Session Note  Patient Details  Name: Christopher [name] MRN: [id number] Date of Birth: 01-20-1927  Today's Date: 07/10/2014 PT Individual Time: 1445-1545 PT Individual Time Calculation (min): 60 min   Short Term Goals: Week 1:  PT Short Term Goal 1 (Week 1): Pt will perform functional transfers with min A PT Short Term Goal 2 (Week 1): Pt will be able to gait x 50' with min A PT Short Term Goal 3 (Week 1): Pt will be able to go up/down 2 steps with L rail for home entry with min A  Skilled Therapeutic Interventions/Progress Updates:    Pt received supine in bed, agreeable to participate in therapy. Session focused on functional endurance, stair negotiation, community ambulation. Pt transferred supine>sit w/ HOB elevated and mod (I), then supine<>sit from flat bed w/ no rails w/ mod (I) to simulate household setup. Pt donned compression socks and shoes w/ MinA to assist with getting compression socks on sock aid. Pt ambulated up to 120' during session w/ overall SBA with pt asking for rest breaks appropriately. Pt ascended/descended 5 stairs w/ 1 rail on L and SBA. Pt transported to hospital lobby and ambulated 120' through outside door and over uneven sidewalk to sit down on bench outside. Pt fatigued after walking on uneven ground and transported back to rehab unit via w/c. Session ended in pt's room, where pt was left seated EOB w/ wife present w/ all needs within reach.    Therapy Documentation Precautions:  Precautions Precautions: Fall Precaution Comments: watch BP Restrictions Weight Bearing Restrictions: Yes RLE Weight Bearing: Weight bearing as tolerated Pain:  6/10 initially, up to 9/10 in R leg with ambulation, pain reduced with seated rest breaks  See FIM for current functional status  Therapy/Group: Individual Therapy  Rada Hay  Rada Hay, PT, DPT 07/10/2014, 7:52 AM

## 2014-07-10 NOTE — Progress Notes (Signed)
[facility] PHYSICAL MEDICINE & REHABILITATION     PROGRESS NOTE    Subjective/Complaints: No abd pain Had 4hours of therapy, some soreness from yesterday \\ROS  + constipation o/w -   Objective: Vital Signs: Blood pressure 118/57, pulse 76, temperature 98.7 F (37.1 C), temperature source Oral, resp. rate 17, weight 96.9 kg (213 lb 10 oz), SpO2 97 %. No results found.  Recent Labs  07/08/14 0505  WBC 10.0  HGB 9.0*  HCT 29.5*  PLT 403*   No results for input(s): NA, K, CL, GLUCOSE, BUN, CREATININE, CALCIUM in the last 72 hours.  Invalid input(s): CO CBG (last 3)   Recent Labs  07/09/14 1626 07/09/14 2105 07/10/14 0644  GLUCAP 121* 119* 90    Wt Readings from Last 3 Encounters:  07/10/14 96.9 kg (213 lb 10 oz)  07/04/14 112.7 kg (248 lb 7.3 oz)  07/28/13 101.6 kg (223 lb 15.8 oz)    Physical Exam:  Constitutional: He is oriented to person, place, and time. He appears well-developed and well-nourished.  HENT:  Head: Normocephalic and atraumatic.  Right Ear: External ear normal.  Left Ear: External ear normal.  Eyes: Conjunctivae are normal. Pupils are equal, round, and reactive to light.  Neck: Normal range of motion. Neck supple.  Cardiovascular: Normal rate and regular rhythm. Exam reveals no gallop and no friction rub.  No murmur heard. Respiratory: Effort normal and breath sounds normal. No respiratory distress. He has no wheezes. He has no rales.  GI: Soft. He exhibits distension. Bowel sounds are decreased. There is no tenderness.  Musculoskeletal:  1+ edema right hip and thigh, lower leg. Small incisions with foam dressing.  Neurological: He is alert and oriented to person, place, and time. No cranial nerve deficit. Coordination normal.  Right Hip and knee pain with lower extremity range of motion no pain with ankle range of motion No pain with left lower extremity range of motion Upper extremity strength 5/5 bilateral deltoids, biceps, triceps,  grip Left lower extremity 4/5 and hip flexor and extensor ankle dorsal flexion plantar flexion  Skin: Skin is warm and dry. Sacral area CDI no erythema  .Psychiatric: He has a normal mood and affect. His behavior is normal.     Assessment/Plan: 1. Functional deficits secondary to right femoral neck fracture which require 3+ hours per day of interdisciplinary therapy in a comprehensive inpatient rehab setting. Physiatrist is providing close team supervision and 24 hour management of active medical problems listed below. Physiatrist and rehab team continue to assess barriers to discharge/monitor patient progress toward functional and medical goals. FIM: FIM - Bathing Bathing Steps Patient Completed: Chest, Right upper leg, Left upper leg, Right Arm, Left Arm, Abdomen, Front perineal area, Buttocks Bathing: 4: Min-Patient completes 8-9 63f 10 parts or 75+ percent  FIM - Upper Body Dressing/Undressing Upper body dressing/undressing steps patient completed: Thread/unthread right sleeve of pullover shirt/dresss, Thread/unthread left sleeve of pullover shirt/dress, Put head through opening of pull over shirt/dress, Pull shirt over trunk Upper body dressing/undressing: 5: Set-up assist to: Obtain clothing/put away FIM - Lower Body Dressing/Undressing Lower body dressing/undressing steps patient completed: Thread/unthread right pants leg, Thread/unthread left pants leg, Pull pants up/down, Don/Doff right sock, Don/Doff left sock, Don/Doff right shoe, Don/Doff left shoe, Fasten/unfasten right shoe, Fasten/unfasten left shoe (used reacher, shoe horn, shoe buttons, sock aid) Lower body dressing/undressing: 5: Supervision: Safety issues/verbal cues  FIM - Toileting Toileting steps completed by patient: Adjust clothing prior to toileting, Adjust clothing after toileting, Performs perineal hygiene  Toileting Assistive Devices: Grab bar or rail for support Toileting: 5: Supervision: Safety issues/verbal  cues  FIM - Radio producer Devices: Walker, Elevated toilet seat (BSC over toilet) Toilet Transfers: 5-To toilet/BSC: Supervision (verbal cues/safety issues), 5-From toilet/BSC: Supervision (verbal cues/safety issues)  FIM - Control and instrumentation engineer Devices: Walker, Arm rests Bed/Chair Transfer: 5: Supine > Sit: Supervision (verbal cues/safety issues), 5: Bed > Chair or W/C: Supervision (verbal cues/safety issues)  FIM - Locomotion: Wheelchair Distance: 175 Locomotion: Wheelchair: 5: Travels 150 ft or more: maneuvers on rugs and over door sills with supervision, cueing or coaxing FIM - Locomotion: Ambulation Locomotion: Ambulation Assistive Devices: Administrator Ambulation/Gait Assistance: 5: Supervision Locomotion: Ambulation: 5: Travels 150 ft or more with supervision/safety issues  Comprehension Comprehension Mode: Auditory Comprehension: 5-Understands complex 90% of the time/Cues < 10% of the time  Expression Expression Mode: Verbal Expression: 5-Expresses complex 90% of the time/cues < 10% of the time  Social Interaction Social Interaction: 6-Interacts appropriately with others with medication or extra time (anti-anxiety, antidepressant).  Problem Solving Problem Solving: 5-Solves basic problems: With no assist  Memory Memory: 6-More than reasonable amt of time   Medical Problem List and Plan: 1. Functional deficits secondary to right femoral neck fracture after fall s/p pinning 2. DVT Prophylaxis/Anticoagulation: Pharmaceutical: Lovenox until coumadin therapeutic  -elevate RLE when in bed/chair 3. Pain Management:     oxycodone prn. Chronic coccydynia, no skin abreakdown 4. Mood: Motivated to get better. LCSW to follow for evaluation and support.  5. Neuropsych: This patient is capable of making decisions on his own behalf. 6. Skin/Wound Care: Routine pressure relief measures. local care.  7.  Fluids/Electrolytes/Nutrition: Monitor I/O. Encourage patient to push fluids.  8. DM type 2:   Will monitor BS with ac/hs checks. Resumed amaryl   -sugars reasonable at present 9. A fib: Will monitor HR every 8 hours. Continue amiodarone and atenolol for rate control.  10. Constipation:   results with supp and enema 11. HTN: Monitor BP every 8 hours. Continue lasix 40 mg daily as well as proscar and tenormin. 12. Acute on chronic anemia:   iron supplement.  13. Leukocytosis: no obvious source. Denies urinary symptoms. Wound looks good. Lungs clear  -wbc's improved today 14.  Constipation adjust bowel program LOS (Days) 6 A FACE TO FACE EVALUATION WAS PERFORMED  Charlett Blake 07/10/2014 7:54 AM

## 2014-07-11 ENCOUNTER — Inpatient Hospital Stay (HOSPITAL_COMMUNITY): Payer: Medicare Other

## 2014-07-11 ENCOUNTER — Inpatient Hospital Stay (HOSPITAL_COMMUNITY): Payer: Medicare Other | Admitting: Occupational Therapy

## 2014-07-11 LAB — GLUCOSE, CAPILLARY
Glucose-Capillary: 98 mg/dL (ref 70–99)
Glucose-Capillary: 138 mg/dL — ABNORMAL HIGH (ref 70–99)
Glucose-Capillary: 103 mg/dL — ABNORMAL HIGH (ref 70–99)
Glucose-Capillary: 115 mg/dL — ABNORMAL HIGH (ref 70–99)

## 2014-07-11 LAB — PROTIME-INR
INR: 2.71 — AB (ref 0.00–1.49)
Prothrombin Time: 29 seconds — ABNORMAL HIGH (ref 11.6–15.2)

## 2014-07-11 NOTE — Progress Notes (Signed)
ANTICOAGULATION CONSULT NOTE - Follow Up Consult  Pharmacy Consult for coumadin Indication: atrial fibrillation  Allergies  Allergen Reactions  . Crestor [Rosuvastatin] Other (See Comments)    Aches in joint   . Lipitor [Atorvastatin] Other (See Comments)    Joint aching  . Penicillins Rash  . Tramadol Nausea Only  . Vicodin [Hydrocodone-Acetaminophen] Anxiety    "Made me nervous"    Patient Measurements: Weight: 213 lb 10 oz (96.9 kg) Heparin Dosing Weight:   Vital Signs: Temp: 98.5 F (36.9 C) (05/02 0900) Temp Source: Oral (05/02 0900) BP: 112/69 mmHg (05/02 0900) Pulse Rate: 62 (05/02 0900)  Labs:  Recent Labs  07/09/14 0613 07/10/14 1130 07/11/14 0449  LABPROT 26.7* 28.1* 29.0*  INR 2.44* 2.60* 2.71*    Estimated Creatinine Clearance: 23.7 mL/min (by C-G formula based on Cr of 2.65).   Medications:  Scheduled:  . amiodarone  200 mg Oral q morning - 10a  . atenolol  25 mg Oral Q breakfast  . finasteride  5 mg Oral Daily  . folic acid  334 mcg Oral Daily  . furosemide  40 mg Oral Daily  . glimepiride  1 mg Oral Q breakfast  . insulin aspart  0-9 Units Subcutaneous TID WC  . iron polysaccharides  150 mg Oral BID AC  . multivitamin with minerals  1 tablet Oral Daily  . omega-3 acid ethyl esters  1 g Oral Daily  . senna-docusate  2 tablet Oral BID  . warfarin  4 mg Oral q1800  . Warfarin - Pharmacist Dosing Inpatient   Does not apply q1800   Infusions:    Assessment: 81 yo male with afib is currently on therapeutic coumadin.  INR 2.71. Last labs Hgb 9 and Plt 403 K  Goal of Therapy:  INR 2-3 Monitor platelets by anticoagulation protocol: Yes   Plan:  - Coumadin 4 mg PO daily - Daily PT / INR  Thanks for allowing pharmacy to be a part of this patient's care.  Maryanna Shape, PharmD, BCPS  Clinical Pharmacist  Pager: [phone]    07/11/2014,1:42 PM

## 2014-07-11 NOTE — Progress Notes (Signed)
Christopher [name] PHYSICAL MEDICINE & REHABILITATION     PROGRESS NOTE    Subjective/Complaints: Coccygeal pain. Otherwise feeling well     Objective: Vital Signs: Blood pressure 120/57, pulse 68, temperature 98.5 F (36.9 C), temperature source Oral, resp. rate 17, weight 96.9 kg (213 lb 10 oz), SpO2 98 %. No results found. No results for input(s): WBC, HGB, HCT, PLT in the last 72 hours. No results for input(s): NA, K, CL, GLUCOSE, BUN, CREATININE, CALCIUM in the last 72 hours.  Invalid input(s): CO CBG (last 3)   Recent Labs  07/10/14 1634 07/10/14 2118 07/11/14 0643  GLUCAP 130* 92 98    Wt Readings from Last 3 Encounters:  07/10/14 96.9 kg (213 lb 10 oz)  07/04/14 112.7 kg (248 lb 7.3 oz)  07/28/13 101.6 kg (223 lb 15.8 oz)    Physical Exam:  Constitutional: He is oriented to person, place, and time. He appears well-developed and well-nourished.  HENT:  Head: Normocephalic and atraumatic.  Right Ear: External ear normal.  Left Ear: External ear normal.  Eyes: Conjunctivae are normal. Pupils are equal, round, and reactive to light.  Neck: Normal range of motion. Neck supple.  Cardiovascular: Normal rate and regular rhythm. Exam reveals no gallop and no friction rub.  No murmur heard. Respiratory: Effort normal and breath sounds normal. No respiratory distress. He has no wheezes. He has no rales.  GI: Soft. He exhibits distension. Bowel sounds are decreased. There is no tenderness.  Musculoskeletal:  1+ edema right hip and thigh, lower leg. Small incisions with foam dressing.  Neurological: He is alert and oriented to person, place, and time. No cranial nerve deficit. Coordination normal.  Right Hip and knee pain with lower extremity range of motion no pain with ankle range of motion No pain with left lower extremity range of motion Upper extremity strength 5/5 bilateral deltoids, biceps, triceps, grip Left lower extremity 4/5 and hip flexor and extensor ankle  dorsal flexion plantar flexion  Skin: Skin is warm and dry. Sacral area CDI no erythema  .Psychiatric: He has a normal mood and affect. His behavior is normal.     Assessment/Plan: 1. Functional deficits secondary to right femoral neck fracture which require 3+ hours per day of interdisciplinary therapy in a comprehensive inpatient rehab setting. Physiatrist is providing close team supervision and 24 hour management of active medical problems listed below. Physiatrist and rehab team continue to assess barriers to discharge/monitor patient progress toward functional and medical goals. FIM: FIM - Bathing Bathing Steps Patient Completed: Chest, Right upper leg, Left upper leg, Right Arm, Left Arm, Abdomen, Front perineal area, Buttocks Bathing: 4: Min-Patient completes 8-9 13f 10 parts or 75+ percent  FIM - Upper Body Dressing/Undressing Upper body dressing/undressing steps patient completed: Thread/unthread right sleeve of pullover shirt/dresss, Thread/unthread left sleeve of pullover shirt/dress, Put head through opening of pull over shirt/dress, Pull shirt over trunk Upper body dressing/undressing: 5: Set-up assist to: Obtain clothing/put away FIM - Lower Body Dressing/Undressing Lower body dressing/undressing steps patient completed: Thread/unthread right pants leg, Thread/unthread left pants leg, Pull pants up/down, Don/Doff right sock, Don/Doff left sock, Don/Doff right shoe, Don/Doff left shoe, Fasten/unfasten right shoe, Fasten/unfasten left shoe (used reacher, shoe horn, shoe buttons, sock aid) Lower body dressing/undressing: 5: Supervision: Safety issues/verbal cues  FIM - Toileting Toileting steps completed by patient: Adjust clothing prior to toileting, Adjust clothing after toileting, Performs perineal hygiene Toileting Assistive Devices: Grab bar or rail for support Toileting: 5: Supervision: Safety issues/verbal cues  FIM - Radio producer Devices:  Walker, Elevated toilet seat (BSC over toilet) Toilet Transfers: 5-To toilet/BSC: Supervision (verbal cues/safety issues), 5-From toilet/BSC: Supervision (verbal cues/safety issues)  FIM - Control and instrumentation engineer Devices: Walker, Arm rests Bed/Chair Transfer: 6: Supine > Sit: No assist, 5: Bed > Chair or W/C: Supervision (verbal cues/safety issues)  FIM - Locomotion: Wheelchair Distance: 175 Locomotion: Wheelchair: 5: Travels 150 ft or more: maneuvers on rugs and over door sills with supervision, cueing or coaxing FIM - Locomotion: Ambulation Locomotion: Ambulation Assistive Devices: Administrator Ambulation/Gait Assistance: 5: Supervision Locomotion: Ambulation: 5: Travels 150 ft or more with supervision/safety issues  Comprehension Comprehension Mode: Auditory Comprehension: 6-Follows complex conversation/direction: With extra time/assistive device  Expression Expression Mode: Verbal Expression: 5-Expresses complex 90% of the time/cues < 10% of the time  Social Interaction Social Interaction: 6-Interacts appropriately with others with medication or extra time (anti-anxiety, antidepressant).  Problem Solving Problem Solving: 5-Solves complex 90% of the time/cues < 10% of the time  Memory Memory: 6-More than reasonable amt of time   Medical Problem List and Plan: 1. Functional deficits secondary to right femoral neck fracture after fall s/p pinning 2. DVT Prophylaxis/Anticoagulation: Pharmaceutical: Lovenox until coumadin therapeutic  -elevate RLE when in bed/chair 3. Pain Management:     oxycodone prn. Chronic coccydynia---ice, prn muscle relaxant 4. Mood: Motivated to get better. LCSW to follow for evaluation and support.  5. Neuropsych: This patient is capable of making decisions on his own behalf. 6. Skin/Wound Care: Routine pressure relief measures. local care.  7. Fluids/Electrolytes/Nutrition: Monitor I/O. Encourage patient to push  fluids.  8. DM type 2:   Will monitor BS with ac/hs checks. Resumed amaryl   -sugars reasonable at present 9. A fib: Will monitor HR every 8 hours. Continue amiodarone and atenolol for rate control.  10. Constipation:   results with supp and enema 11. HTN: Monitor BP every 8 hours. Continue lasix 40 mg daily as well as proscar and tenormin. 12. Acute on chronic anemia:   iron supplement.  13. Leukocytosis: no obvious source. Denies urinary symptoms. Wound looks good. Lungs clear  -wbc's improved  14.  Constipation adjusted bowel program LOS (Days) 7 A FACE TO FACE EVALUATION WAS PERFORMED  Christopher [name] T 07/11/2014 8:49 AM

## 2014-07-11 NOTE — Progress Notes (Signed)
Physical Therapy Session Note  Patient Details  Name: Christopher [name] MRN: [id number] Date of Birth: 15-Nov-1926  Today's Date: 07/11/2014 PT Individual Time: 7371-0626 PT Individual Time Calculation (min): 90 min   Short Term Goals: Week 1:  PT Short Term Goal 1 (Week 1): Pt will perform functional transfers with min A PT Short Term Goal 2 (Week 1): Pt will be able to gait x 50' with min A PT Short Term Goal 3 (Week 1): Pt will be able to go up/down 2 steps with L rail for home entry with min A  Skilled Therapeutic Interventions/Progress Updates:    Session focused on overall activity tolerance, functional transfers with RW including bed, car, and toilet, gait with RW, LE HEP for functional strengthening and ROM, dynamic gait through obstacle course to simulate home and community environment, and neuro re-ed for balance re-training.   HEP including LAQ with 5 second hold, standing hip abduction, standing hamstring curls, and standing heel/toe raises x 15 reps each BLE with seated rest breaks between sets to address functional strengthening and standing endurance.  Pt required verbal cues for hand placement during simulated car transfer using RW for transfer and used leg lifter to manage RLE due to pain. Dynamic gait through obstacle course to navigate turns, step over simulated thresholds, and navigate compliant surface with RW to simulate uneven surfaces with overall min A x 40' x 2. Pt requires cues for close placement of body inside RW during turns and getting close to threshold before stepping over.  Neuro re-ed in standing with 1 UE support while on compliant surface while performing functional reaching task to both directions with min A to address balance re-training, standing tolerance, and postural control in standing.  Education with pt and pt's wife at end of session in regards to follow up therapy recommendations, home set-up, energy conservation, and d/c planning. Both feel confident  with d/c for Wed if ok'd by rest of team ( CSW has been notified).    Therapy Documentation Precautions:  Precautions Precautions: Fall Precaution Comments: watch BP Restrictions Weight Bearing Restrictions: Yes RLE Weight Bearing: Weight bearing as tolerated  Pain:  c/o 9/10 pain in R hip - RN notified and pain medication given. Locomotion : Ambulation Ambulation/Gait Assistance: 5: Supervision   See FIM for current functional status  Therapy/Group: Individual Therapy  Christopher [name], PT, DPT  07/11/2014, 3:38 PM

## 2014-07-11 NOTE — Progress Notes (Signed)
Physical Therapy Session Note  Patient Details  Name: Christopher [name] MRN: [id number] Date of Birth: 04/25/1926  Today's Date: 07/11/2014 PT Individual Time: 1000-1100 PT Individual Time Calculation (min): 60 min   Short Term Goals: Week 1:  PT Short Term Goal 1 (Week 1): Pt will perform functional transfers with min A PT Short Term Goal 2 (Week 1): Pt will be able to gait x 50' with min A PT Short Term Goal 3 (Week 1): Pt will be able to go up/down 2 steps with L rail for home entry with min A  Skilled Therapeutic Interventions/Progress Updates:   Discussion with pt and wife in regards to d/c planning and needed DME. Will discuss with CSW recommendation for RW. Pt reports they have a w/c at home if they were to need it. Pt and wife still feel confident with possibly moving d/c up a few days. Will discuss with primary team.  Pt donned socks and shoes independently EOB using AE and extra time. Close S for transfers and gait with RW in/out of bathroom and for toilet transfers. Gait training with RW down to therapy gym with 1 seated rest break and focus on upright posture and positioning of RW for improved efficiency. Stair negotiation training using L rail for home entry simulation x 2 repetitions with close S/steady A. Dynamic standing balance activity for trunk and hip rotation (moving like you would to hula hoop) which pt reported felt better on his back and legs. Nustep for functional strengthening, endurance and ROM x 10 min on level 5. End of session returned back to bed with S using RW to rest due to increased pain in sacrum (from old injury) while sitting up.   Therapy Documentation Precautions:  Precautions Precautions: Fall Precaution Comments: watch BP Restrictions Weight Bearing Restrictions: Yes RLE Weight Bearing: Weight bearing as tolerated Therapy Vitals Temp: 98.5 F (36.9 C) Temp Source: Oral Pulse Rate: 62 Resp: 18 BP: 112/69 mmHg Patient Position (if appropriate):  Sitting Pain: Reports some discomfort but declines pain medication for R hip.   See FIM for current functional status  Therapy/Group: Individual Therapy  Canary Brim Ivory Broad, PT, DPT  07/11/2014, 12:10 PM

## 2014-07-11 NOTE — Progress Notes (Signed)
Occupational Therapy Session Note  Patient Details  Name: Christopher [name] MRN: [id number] Date of Birth: Nov 19, 1926  Today's Date: 07/11/2014 OT Individual Time: 8469-6295 OT Individual Time Calculation (min): 60 min    Short Term Goals: Week 1:  OT Short Term Goal 1 (Week 1): Pt will complete toilet transfers with min A OT Short Term Goal 2 (Week 1): Pt will complete LB dressing with min A OT Short Term Goal 3 (Week 1): Pt will tolerate 5 minutes of standing with min A to complete grooming task OT Short Term Goal 4 (Week 1): Pt will complete toileting task with mod A  Skilled Therapeutic Interventions/Progress Updates:    Pt seen for OT ADL bathing and dressing session. Pt in supine upon arrival, agreeable to tx. Pt transferred supine> EOB with supervision and ambulated into walk in shower with RW and supervision. Pt bathed seated in shower with supervision to stand up to complete buttock hygiene. Pt educated on use of lateral leans to complete hygiene in order to reduce caregiver burden with need for supervision. Pt dressed seated in w/c, mod I UB and supervision with VCs for proper use of sock aid to don B socks. Pt then self-propelled w/c throughout unit mod I to 3 different community bathrooms. Pt ambulated into bathroom with RW and supervision and completed toilet transfers from various height community toilets with use of grab bars and RW. Pt  Educated regarding community mobility and safety when navigating public restrooms (use of grab bars, safety consideration when use toilet holder to help stand).    Pt ambulated ~25 yards back to room at end of session before fatiguing and requesting seated rest break. Pt returned to room in w/c mod I. He ambulated in room to EOB, and left sitting EOB at end of session, nurse and NT present.   Therapy Documentation Precautions:  Precautions Precautions: Fall Precaution Comments: watch BP Restrictions Weight Bearing Restrictions: Yes RLE Weight  Bearing: Weight bearing as tolerated Pain: Pain Assessment Pain Assessment: 0-10 Pain Score: 9  Pain Type: Acute pain Pain Location: Other (Comment) (Tailbone from old injury) Pain Descriptors / Indicators: Aching Pain Onset: Gradual Pain Intervention(s): Shower;Ambulation/increased activity;Repositioned  See FIM for current functional status  Therapy/Group: Individual Therapy  [name], Kristian Mogg C 07/11/2014, 10:08 AM

## 2014-07-12 ENCOUNTER — Inpatient Hospital Stay (HOSPITAL_COMMUNITY): Payer: Medicare Other

## 2014-07-12 ENCOUNTER — Inpatient Hospital Stay (HOSPITAL_COMMUNITY): Payer: Medicare Other | Admitting: Occupational Therapy

## 2014-07-12 ENCOUNTER — Inpatient Hospital Stay (HOSPITAL_COMMUNITY): Payer: Medicare Other | Admitting: Physical Therapy

## 2014-07-12 LAB — PROTIME-INR
INR: 2.78 — AB (ref 0.00–1.49)
PROTHROMBIN TIME: 29.5 s — AB (ref 11.6–15.2)

## 2014-07-12 LAB — GLUCOSE, CAPILLARY
GLUCOSE-CAPILLARY: 90 mg/dL (ref 70–99)
GLUCOSE-CAPILLARY: 101 mg/dL — AB (ref 70–99)
GLUCOSE-CAPILLARY: 84 mg/dL (ref 70–99)
Glucose-Capillary: 105 mg/dL — ABNORMAL HIGH (ref 70–99)

## 2014-07-12 NOTE — Progress Notes (Signed)
[place] PHYSICAL MEDICINE & REHABILITATION     PROGRESS NOTE    Subjective/Complaints: Coccygeal pain better today.      Objective: Vital Signs: Blood pressure 110/97, pulse 69, temperature 98.4 F (36.9 C), temperature source Oral, resp. rate 19, weight 96.9 kg (213 lb 10 oz), SpO2 90 %. No results found. No results for input(s): WBC, HGB, HCT, PLT in the last 72 hours. No results for input(s): NA, K, CL, GLUCOSE, BUN, CREATININE, CALCIUM in the last 72 hours.  Invalid input(s): CO CBG (last 3)   Recent Labs  07/11/14 1633 07/11/14 2104 07/12/14 0706  GLUCAP 103* 115* 90    Wt Readings from Last 3 Encounters:  07/10/14 96.9 kg (213 lb 10 oz)  07/04/14 112.7 kg (248 lb 7.3 oz)  07/28/13 101.6 kg (223 lb 15.8 oz)    Physical Exam:  Constitutional: He is oriented to person, place, and time. He appears well-developed and well-nourished.  HENT:  Head: Normocephalic and atraumatic.  Right Ear: External ear normal.  Left Ear: External ear normal.  Eyes: Conjunctivae are normal. Pupils are equal, round, and reactive to light.  Neck: Normal range of motion. Neck supple.  Cardiovascular: Normal rate and regular rhythm. Exam reveals no gallop and no friction rub.  No murmur heard. Respiratory: Effort normal and breath sounds normal. No respiratory distress. He has no wheezes. He has no rales.  GI: Soft. He exhibits distension. Bowel sounds are decreased. There is no tenderness.  Musculoskeletal:  1+ edema right hip and thigh, lower leg. Small incisions with foam dressing.  Neurological: He is alert and oriented to person, place, and time. No cranial nerve deficit. Coordination normal.  Right Hip and knee pain with lower extremity range of motion no pain with ankle range of motion No pain with left lower extremity range of motion Upper extremity strength 5/5 bilateral deltoids, biceps, triceps, grip Left lower extremity 4/5 and hip flexor and extensor ankle dorsal  flexion plantar flexion  Skin: Skin is warm and dry. Sacral area CDI no erythema  .Psychiatric: He has a normal mood and affect. His behavior is normal.     Assessment/Plan: 1. Functional deficits secondary to right femoral neck fracture which require 3+ hours per day of interdisciplinary therapy in a comprehensive inpatient rehab setting. Physiatrist is providing close team supervision and 24 hour management of active medical problems listed below. Physiatrist and rehab team continue to assess barriers to discharge/monitor patient progress toward functional and medical goals.  Ok to go home tomorrow  FIM: FIM - Bathing Bathing Steps Patient Completed: Chest, Right upper leg, Left upper leg, Right Arm, Left Arm, Abdomen, Front perineal area, Buttocks, Right lower leg (including foot), Left lower leg (including foot) Bathing: 5: Supervision: Safety issues/verbal cues  FIM - Upper Body Dressing/Undressing Upper body dressing/undressing steps patient completed: Thread/unthread right sleeve of pullover shirt/dresss, Thread/unthread left sleeve of pullover shirt/dress, Put head through opening of pull over shirt/dress, Pull shirt over trunk Upper body dressing/undressing: 6: More than reasonable amount of time FIM - Lower Body Dressing/Undressing Lower body dressing/undressing steps patient completed: Thread/unthread right pants leg, Thread/unthread left pants leg, Pull pants up/down, Don/Doff right sock, Don/Doff left sock, Don/Doff right shoe, Don/Doff left shoe, Fasten/unfasten right shoe, Fasten/unfasten left shoe Lower body dressing/undressing: 5: Set-up assist to: Obtain clothing  FIM - Toileting Toileting steps completed by patient: Adjust clothing prior to toileting, Adjust clothing after toileting, Performs perineal hygiene Toileting Assistive Devices: Grab bar or rail for support Toileting: 5:  Supervision: Safety issues/verbal cues  FIM - Air cabin crew Transfers  Assistive Devices: Environmental consultant, Product manager Transfers: 5-To toilet/BSC: Supervision (verbal cues/safety issues), 5-From toilet/BSC: Supervision (verbal cues/safety issues)  FIM - Control and instrumentation engineer Devices: Walker, Arm rests Bed/Chair Transfer: 6: Supine > Sit: No assist, 5: Bed > Chair or W/C: Supervision (verbal cues/safety issues)  FIM - Locomotion: Wheelchair Distance: 175 Locomotion: Wheelchair: 5: Travels 150 ft or more: maneuvers on rugs and over door sills with supervision, cueing or coaxing FIM - Locomotion: Ambulation Locomotion: Ambulation Assistive Devices: Administrator Ambulation/Gait Assistance: 5: Supervision Locomotion: Ambulation: 5: Travels 150 ft or more with supervision/safety issues  Comprehension Comprehension Mode: Auditory Comprehension: 6-Follows complex conversation/direction: With extra time/assistive device  Expression Expression Mode: Verbal Expression: 5-Expresses complex 90% of the time/cues < 10% of the time  Social Interaction Social Interaction: 6-Interacts appropriately with others with medication or extra time (anti-anxiety, antidepressant).  Problem Solving Problem Solving: 5-Solves complex 90% of the time/cues < 10% of the time  Memory Memory: 6-More than reasonable amt of time   Medical Problem List and Plan: 1. Functional deficits secondary to right femoral neck fracture after fall s/p pinning 2. DVT Prophylaxis/Anticoagulation: Pharmaceutical: Lovenox until coumadin therapeutic  -elevate RLE when in bed/chair 3. Pain Management:     oxycodone prn. Chronic coccydynia---ice, prn muscle relaxant---improved 4. Mood: Motivated to get better. LCSW to follow for evaluation and support.  5. Neuropsych: This patient is capable of making decisions on his own behalf. 6. Skin/Wound Care: Routine pressure relief measures. local care.  7. Fluids/Electrolytes/Nutrition: Monitor I/O. Encourage patient to push  fluids.  8. DM type 2:   Will monitor BS with ac/hs checks. Resumed amaryl   -sugars reasonable at present 9. A fib: Will monitor HR every 8 hours. Continue amiodarone and atenolol for rate control.  10. Constipation:   results with supp and enema 11. HTN: Monitor BP every 8 hours. Continue lasix 40 mg daily as well as proscar and tenormin. 12. Acute on chronic anemia:   iron supplement.  13. Leukocytosis: no obvious source. Denies urinary symptoms. Wound looks good. Lungs clear  -wbc's improved  14.  Constipation adjusted bowel program LOS (Days) 8 A FACE TO FACE EVALUATION WAS PERFORMED  Hadja Harral T 07/12/2014 8:59 AM

## 2014-07-12 NOTE — Progress Notes (Signed)
ANTICOAGULATION CONSULT NOTE - Follow Up Consult  Pharmacy Consult for coumadin Indication: atrial fibrillation  Allergies  Allergen Reactions  . Crestor [Rosuvastatin] Other (See Comments)    Aches in joint   . Lipitor [Atorvastatin] Other (See Comments)    Joint aching  . Penicillins Rash  . Tramadol Nausea Only  . Vicodin [Hydrocodone-Acetaminophen] Anxiety    "Made me nervous"    Patient Measurements: Weight: 213 lb 10 oz (96.9 kg)  Vital Signs: Temp: 98.4 F (36.9 C) (05/03 0640) Temp Source: Oral (05/03 0640)  Labs:  Recent Labs  07/10/14 1130 07/11/14 0449 07/12/14 0642  LABPROT 28.1* 29.0* 29.5*  INR 2.60* 2.71* 2.78*    Estimated Creatinine Clearance: 23.7 mL/min (by C-G formula based on Cr of 2.65).   Medications:  Scheduled:  . amiodarone  200 mg Oral q morning - 10a  . atenolol  25 mg Oral Q breakfast  . finasteride  5 mg Oral Daily  . folic acid  270 mcg Oral Daily  . furosemide  40 mg Oral Daily  . glimepiride  1 mg Oral Q breakfast  . insulin aspart  0-9 Units Subcutaneous TID WC  . iron polysaccharides  150 mg Oral BID AC  . multivitamin with minerals  1 tablet Oral Daily  . omega-3 acid ethyl esters  1 g Oral Daily  . senna-docusate  2 tablet Oral BID  . warfarin  4 mg Oral q1800  . Warfarin - Pharmacist Dosing Inpatient   Does not apply q1800   Infusions:    Assessment: 81 yo male with afib is currently on therapeutic coumadin.  INR 2.78. Last labs Hgb 9 and Plt 403 K, no bleeding noted per chart.   Goal of Therapy:  INR 2-3 Monitor platelets by anticoagulation protocol: Yes   Plan:  - Coumadin 4 mg PO daily - Daily PT / INR  Thanks for allowing pharmacy to be a part of this patient's care.  Maryanna Shape, PharmD, BCPS  Clinical Pharmacist  Pager: [phone]   07/12/2014,1:50 PM

## 2014-07-12 NOTE — Progress Notes (Signed)
Occupational Therapy Discharge Summary  Patient Details  Name: Christopher [name] MRN: [id number] Date of Birth: [birth year]  Today's Date: 07/12/2014 OT Individual Time: 1300-1345 OT Individual Time Calculation (min): 45 min    Patient has met 8 of 8 long term goals due to improved activity tolerance, improved balance, postural control and ability to compensate for deficits.  Patient to discharge at overall Supervision level.  Patient's care partner is independent to provide the necessary physical assistance at discharge.    Recommendation:  Patient with no further OT needs.   Equipment: 3-1 BSC  Reasons for discharge: treatment goals met and discharge from hospital  Patient/family agrees with progress made and goals achieved: Yes   Skilled Therapeutic Interventions/Progress Updates:  Pt seen for OT ADL bathing and dressing session. Pt in supine upon arrival, agreeable to tx. Pt bathed seated in w/c at the sink with supervision. He stood at sink with supervision to complete buttock hygiene and pericare, demonstrating function static/ dynamic standing balance utilizing B UEs during functional task while standing without LOB. Pt dressed LB with supervision to standing at the sink to pull pants up/down and utilized sock aid to don B socks and shoes with set-up. Pt then self propelled w/c to therapy gym where he completed functional transfers on/off tub transfer bench, toilet, standard bed, and low surfaced couch with supervision. Pt returned to room at end of session and transferred to EOB with supervision and RW. Pt left sitting on EOB set up with meal tray, all needs in reach, and wife present.    Education provided regarding energy conservation, assistance required upon d/c, importance of participation with ADLs/IADLs, DME, and d/c planning.  OT Discharge Precautions/Restrictions  Precautions Precautions: Fall Restrictions Weight Bearing Restrictions: No RLE Weight Bearing: Weight bearing as  tolerated Pain  No/ Denies pain Vision/Perception  Vision- History Baseline Vision/History: Wears glasses Wears Glasses: At all times Patient Visual Report: No change from baseline  Cognition Overall Cognitive Status: Within Functional Limits for tasks assessed Orientation Level: Oriented X4 Awareness: Appears intact Problem Solving: Appears intact Safety/Judgment: Appears intact Sensation Sensation Light Touch: Appears Intact Proprioception: Appears Intact Coordination Gross Motor Movements are Fluid and Coordinated: Yes Fine Motor Movements are Fluid and Coordinated: Yes Motor  Motor Motor: Within Functional Limits Mobility  Bed Mobility Supine to Sit: 6: Modified independent (Device/Increase time) Sit to Supine: 6: Modified independent (Device/Increase time) Transfers Transfers: Sit to Stand;Stand to Sit Sit to Stand: 5: Supervision Stand to Sit: 5: Supervision  Trunk/Postural Assessment  Cervical Assessment Cervical Assessment: Within Functional Limits Thoracic Assessment Thoracic Assessment:  (Flexed posture) Lumbar Assessment Lumbar Assessment:  (posterior pelvic tilt) Postural Control Postural Control: Within Functional Limits  Balance Balance Balance Assessed: Yes Static Sitting Balance Static Sitting - Level of Assistance: 6: Modified independent (Device/Increase time) Dynamic Sitting Balance Dynamic Sitting - Level of Assistance: 6: Modified independent (Device/Increase time) Sitting balance - Comments: Sitting on tub transfer bench to complete bathing Static Standing Balance Static Standing - Level of Assistance: 5: Stand by assistance Dynamic Standing Balance Dynamic Standing - Level of Assistance: 5: Stand by assistance Extremity/Trunk Assessment RUE Assessment RUE Assessment: Within Functional Limits LUE Assessment LUE Assessment: Within Functional Limits  See FIM for current functional status  [name], Mersedes Alber C 07/12/2014, 2:37 PM

## 2014-07-12 NOTE — Progress Notes (Signed)
Physical Therapy Discharge Summary  Patient Details  Name: Christopher [name] MRN: [id number] Date of Birth: 1926/07/29   Patient has met 9 of 9 long term goals due to improved activity tolerance, improved balance, increased strength, increased range of motion, decreased pain, ability to compensate for deficits and functional use of  right lower extremity.  Patient to discharge at an ambulatory level Supervision using RW. Patient's care partner is independent to provide the necessary supervision/set-up assistance at discharge.  Reasons goals not met: n/a - all goals met at this time.  Recommendation:  Patient will benefit from ongoing skilled PT services in home health setting to continue to advance safe functional mobility, address ongoing impairments in gait, balance, ROM, strength, endurance, functional mobility, and minimize fall risk.  Equipment: RW  Reasons for discharge: treatment goals met and discharge from hospital  Patient/family agrees with progress made and goals achieved: Yes  PT Discharge Precautions/Restrictions Precautions Precautions: Fall Restrictions Weight Bearing Restrictions: No RLE Weight Bearing: Weight bearing as tolerated Cognition Overall Cognitive Status: Within Functional Limits for tasks assessed Safety/Judgment: Appears intact Sensation Sensation Light Touch: Appears Intact Proprioception: Appears Intact Coordination Gross Motor Movements are Fluid and Coordinated: Yes Motor  Motor Motor: Within Functional Limits   Locomotion  Ambulation Ambulation/Gait Assistance: 5: Supervision Gait Gait velocity: 1.92 ft/ sec (This improved since admission from 0.75 ft/sec) Trunk/Postural Assessment  Cervical Assessment Cervical Assessment: Within Functional Limits Thoracic Assessment Thoracic Assessment:  (flexed posture) Lumbar Assessment Lumbar Assessment:  (posterior pelvic tilt) Postural Control Postural Control: Within Functional Limits   Balance Balance Balance Assessed: Yes Static Sitting Balance Static Sitting - Level of Assistance: 6: Modified independent (Device/Increase time) Dynamic Sitting Balance Dynamic Sitting - Level of Assistance: 6: Modified independent (Device/Increase time) Static Standing Balance Static Standing - Level of Assistance: 5: Stand by assistance Dynamic Standing Balance Dynamic Standing - Level of Assistance: 5: Stand by assistance Extremity Assessment      RLE Assessment RLE Assessment: Exceptions to [facility] RLE Strength RLE Overall Strength Comments: 3-/5 at hip; 4/5 knee and ankle WFL LLE Assessment LLE Assessment: Within Functional Limits (decreased muscular endurance)  See FIM for current functional status  Canary Brim Ivory Broad, PT, DPT  07/12/2014, 2:04 PM

## 2014-07-12 NOTE — Progress Notes (Signed)
Physical Therapy Session Note  Patient Details  Name: Christopher [name] MRN: [id number] Date of Birth: 11/10/1926  Today's Date: 07/12/2014 PT Individual Time: 1000-1100 PT Individual Time Calculation (min): 60 min   Short Term Goals: Week 1:  PT Short Term Goal 1 (Week 1): Pt will perform functional transfers with min A PT Short Term Goal 2 (Week 1): Pt will be able to gait x 50' with min A PT Short Term Goal 3 (Week 1): Pt will be able to go up/down 2 steps with L rail for home entry with min A  Skilled Therapeutic Interventions/Progress Updates:   Session focused on grad day activities and finalizing family education with pt's wife. Overall pt performing mobility at S level but educated wife of cues for RW management during gait and transfers (for pt to stay inside of RW and maintain upright posture). Also reviewed simulated car transfer with RW with overall S and recommendations for pushing seat back to increase leg room. Stair negotiation performed at close S with L rail to simulate home entry and wife re-educated on step to technique with which foot to lead with though pt independent in recalling this information. Nustep x 10 min with 1 rest break due to hot flash on level 5 for overall endurance and strengthening and addressing hip ROM. Pt propelled w/c in between using BLE at mod I level. End of session returned to bed to rest and relieve pressure off of coccyx (old chronic injury). Pt and family deny concerns in regards to d/c at this time.  Therapy Documentation Precautions:  Precautions Precautions: Fall Precaution Comments: watch BP Restrictions Weight Bearing Restrictions: No RLE Weight Bearing: Weight bearing as tolerated  Pain:  Reports pain is minimal - premedicated. Recommended for pt use ice packs today between therapy sessions.   See FIM for current functional status  Therapy/Group: Individual Therapy  Canary Brim Ivory Broad, PT, DPT  07/12/2014, 12:06 PM

## 2014-07-12 NOTE — Progress Notes (Signed)
Physical Therapy Session Note  Patient Details  Name: Christopher [name] MRN: [id number] Date of Birth: 12-02-1926  Today's Date: 07/12/2014 PT Individual Time: 1138-1205 PT Individual Time Calculation (min): 27 min   Short Term Goals: Week 1:  PT Short Term Goal 1 (Week 1): Pt will perform functional transfers with min A PT Short Term Goal 2 (Week 1): Pt will be able to gait x 50' with min A PT Short Term Goal 3 (Week 1): Pt will be able to go up/down 2 steps with L rail for home entry with min A  Skilled Therapeutic Interventions/Progress Updates:   Pt received in bed with wife and friends present.  Pt reporting completing gait, stairs, car and all transfers with primary PT this am.  When questioned about conditions outside from car> house pt reports gravel and stepping stones across yard to stairs; he also reports his sons are building him a porch that extends to the driveway so he doesn't have to ambulate across the yard.  Pt agreeable to perform gait train and simulate stepping stones and porch.  Pt performed all transfers in/out of bed and to/from w/c with RW and supervision.  Set up 10 "stepping stones" in hallway 6" apart and had pt perform gait with narrow BOS across stepping stones x 4 reps with RW and supervision.  Also set up platform to simulate step up onto porch once completed; demonstrated sequence to pt and had pt return demonstrate x 2 reps with supervision and one verbal cue for safe sequence.  Performed ambulation back to room with supervision and verbal cues to maintain upright posture and safe distance to RW.  Pt returned to sitting EOB for lunch.        Therapy Documentation Precautions:  Precautions Precautions: Fall Precaution Comments: watch BP Restrictions Weight Bearing Restrictions: No RLE Weight Bearing: Weight bearing as tolerated Vital Signs: Therapy Vitals Temp: 98.8 F (37.1 C) Temp Source: Oral Pulse Rate: 62 Resp: 18 BP: (!) 96/47 mmHg Patient Position  (if appropriate): Lying Oxygen Therapy SpO2: 93 % O2 Device: Not Delivered Pain: Pain Assessment Pain Assessment: No/denies pain Pain Score: 4  Pain Type: Acute pain Pain Location: Hip Pain Orientation: Right Pain Intervention(s): Medication (See eMAR);Cold applied  See FIM for current functional status  Therapy/Group: Individual Therapy  [name] [facility] 07/12/2014, 5:03 PM

## 2014-07-12 NOTE — Progress Notes (Signed)
Occupational Therapy Session Note  Patient Details  Name: Christopher [name] MRN: [id number] Date of Birth: 1927/01/01  Today's Date: 07/12/2014 OT Individual Time: 1300-1345 OT Individual Time Calculation (min): 45 min    Short Term Goals: Week 1:  OT Short Term Goal 1 (Week 1): Pt will complete toilet transfers with min A OT Short Term Goal 2 (Week 1): Pt will complete LB dressing with min A OT Short Term Goal 3 (Week 1): Pt will tolerate 5 minutes of standing with min A to complete grooming task OT Short Term Goal 4 (Week 1): Pt will complete toileting task with mod A  Skilled Therapeutic Interventions/Progress Updates:    Pt seen for 1:1 OT session with focus on functional transfers and functional mobility in community environment, activity tolerance, and overall strengthening. Pt received supine in bed. Pt completed supine>sit at Mod I level and donned shoes with increased time. Transferred to w/c at Mod I level using RW. Pt propelled self on/off unit in community environment using BUE/BLEs to increase overall strength and activity tolerance. Pt required multiple rest breaks during w/c propulsion. Practiced transferring to restaurant style table and chair at supervision level with increased time for sit>stand and min cues for technique. Pt engaged in functional ambulation in hospital gift shop with focus on navigating in crowded environment and close spaces. Pt propelled self back to room with 1 rest break. Pt transferred back to bed and left supine with ice pack placed on R hip.   Therapy Documentation Precautions:  Precautions Precautions: Fall Precaution Comments: watch BP Restrictions Weight Bearing Restrictions: No RLE Weight Bearing: Weight bearing as tolerated General:   Vital Signs:  Pain: Pt reporting some pain in R hip. Pt utilizing ice packs between therapy sessions. Pt not due for medication at this time.  See FIM for current functional status  Therapy/Group: Individual  Therapy  Duayne Cal 07/12/2014, 1:49 PM

## 2014-07-13 LAB — PROTIME-INR
INR: 2.81 — ABNORMAL HIGH (ref 0.00–1.49)
Prothrombin Time: 29.8 seconds — ABNORMAL HIGH (ref 11.6–15.2)

## 2014-07-13 LAB — GLUCOSE, CAPILLARY: GLUCOSE-CAPILLARY: 92 mg/dL (ref 70–99)

## 2014-07-13 MED ORDER — METHOCARBAMOL 500 MG PO TABS: 500.0000 mg | ORAL_TABLET | Freq: Four times a day (QID) | ORAL | Status: DC | PRN

## 2014-07-13 MED ORDER — POLYSACCHARIDE IRON COMPLEX 150 MG PO CAPS: 150.0000 mg | ORAL_CAPSULE | Freq: Two times a day (BID) | ORAL | Status: DC

## 2014-07-13 MED ORDER — SENNOSIDES-DOCUSATE SODIUM 8.6-50 MG PO TABS
2.0000 | ORAL_TABLET | Freq: Two times a day (BID) | ORAL | Status: DC
Start: 2014-07-13 — End: 2015-06-28

## 2014-07-13 MED ORDER — FUROSEMIDE 40 MG PO TABS: 40.0000 mg | ORAL_TABLET | Freq: Every day | ORAL | Status: DC

## 2014-07-13 MED ORDER — OXYCODONE HCL 5 MG PO TABS: 5.0000 mg | ORAL_TABLET | Freq: Four times a day (QID) | ORAL | Status: DC | PRN

## 2014-07-13 MED ORDER — GLIMEPIRIDE 1 MG PO TABS: 1.0000 mg | ORAL_TABLET | Freq: Every day | ORAL | Status: AC

## 2014-07-13 NOTE — Patient Care Conference (Signed)
Inpatient RehabilitationTeam Conference and Plan of Care Update Date: 07/12/2014   Time: 2:15 PM    Patient Name: Christopher [name]      Medical Record Number: [id number]  Date of Birth: December 07, 1926 Sex: Male         Room/Bed: 4W19C/4W19C-01 Payor Info: Payor: MEDICARE / Plan: MEDICARE PART A AND B / Product Type: *No Product type* /    Admitting Diagnosis: RT HIP FX S P PINNNG   Admit Date/Time:  07/04/2014  6:08 PM Admission Comments: No comment available   Primary Diagnosis:  Fracture of femoral neck, right Principal Problem: Fracture of femoral neck, right  Patient Active Problem List   Diagnosis Date Noted  . Fracture of femoral neck, right   . Intertrochanteric fracture of left hip   . Closed right hip fracture 06/28/2014  . CHF (congestive heart failure) 06/28/2014  . Hip fracture 06/28/2014  . Fall   . Acute diastolic congestive heart failure, NYHA class 2 07/24/2013  . Acute respiratory failure 07/24/2013  . DM type 2 causing renal disease 07/24/2013  . Shortness of breath 07/24/2013  . Malignant neoplasm of prostate 07/16/2013  . History of amiodarone therapy 07/16/2012  . Atrial fibrillation 08/07/2011  . Lumbar disc disease   . Hyperlipidemia   . Hypertensive heart disease without CHF   . Kidney disease, chronic, stage III (GFR 30-59 ml/min)   . Long-term (current) use of anticoagulants   . BPH (benign prostatic hyperplasia)   . Cardiac pacemaker in situ   . CAD (coronary artery disease) 03/06/2010    Expected Discharge Date: Expected Discharge Date: 07/13/14  Team Members Present: Physician leading conference: Dr. Alger Simons Social Worker Present: Alfonse Alpers, LCSW Nurse Present: Dorien Chihuahua, RN PT Present: Canary Brim, Lorriane Shire, PT OT Present: Other (comment) Napoleon Form, OT) SLP Present: Weston Anna, SLP PPS Coordinator present : Daiva Nakayama, RN, CRRN     Current Status/Progress Goal Weekly Team Focus  Medical   right leg stronger. some  coccodynea improved today.  finalzie dc planning  finalize dc planning   Bowel/Bladder   continent bowel and bladder  To continue continent to bowel and bladder.  monitor bowel and blader function q shift   Swallow/Nutrition/ Hydration             ADL's   Overall Supervision bathing and dressing; supervision-min A Functional transfers  Sueprvision functional transfers; [place] I dressing  Education on AE; functional standing endurance/ activity tolerance   Mobility   S to steady A overall  S overall; min A car   d/c planning, endurance, strengthening, gait, balance, stairs   Communication             Safety/Cognition/ Behavioral Observations            Pain   Rt. hip pain using Oxy IR 5mg . PRN  To keep pain levels less than 3.On scale 1 to 10.  Assess pain q 4 hrs. and PRN   Skin   RUE skin tear with tegaderm, incision to Rt. hip healed  To keep skin free of breakdown.  assess skin q shift    Rehab Goals Patient on target to meet rehab goals: Yes Rehab Goals Revised: none *See Care Plan and progress notes for long and short-term goals.  Barriers to Discharge: none    Possible Resolutions to Barriers:  none    Discharge Planning/Teaching Needs:  Pt plans to return to his home to his wife who will provide 24/7 supervision  Family education with pt's wife has been completed.   Team Discussion:  Pt is doing well and is ready for d/c.  He will need a rolling walker and bedside commode with [place] for PT and RN.  Revisions to Treatment Plan:  None   Continued Need for Acute Rehabilitation Level of Care: The patient requires daily medical management by a physician with specialized training in physical medicine and rehabilitation for the following conditions: Daily direction of a multidisciplinary physical rehabilitation program to ensure safe treatment while eliciting the highest outcome that is of practical value to the patient.: Yes Daily medical management of patient  stability for increased activity during participation in an intensive rehabilitation regime.: Yes Daily analysis of laboratory values and/or radiology reports with any subsequent need for medication adjustment of medical intervention for : Post surgical problems;Other  [name], Silvestre Mesi 07/14/2014, 8:42 AM

## 2014-07-13 NOTE — Progress Notes (Signed)
[facility]  PHYSICAL MEDICINE & REHABILITATION     PROGRESS NOTE    Subjective/Complaints: Feeling well. Excited to go home today      Objective: Vital Signs: Blood pressure 109/55, pulse 63, temperature 97.4 F (36.3 C), temperature source Oral, resp. rate 16, weight 106 kg (233 lb 11 oz), SpO2 93 %. No results found. No results for input(s): WBC, HGB, HCT, PLT in the last 72 hours. No results for input(s): NA, K, CL, GLUCOSE, BUN, CREATININE, CALCIUM in the last 72 hours.  Invalid input(s): CO CBG (last 3)   Recent Labs  07/12/14 1705 07/12/14 2054 07/13/14 0652  GLUCAP 105* 84 92    Wt Readings from Last 3 Encounters:  07/13/14 106 kg (233 lb 11 oz)  07/04/14 112.7 kg (248 lb 7.3 oz)  07/28/13 101.6 kg (223 lb 15.8 oz)    Physical Exam:  Constitutional: He is oriented to person, place, and time. He appears well-developed and well-nourished.  HENT:  Head: Normocephalic and atraumatic.  Right Ear: External ear normal.  Left Ear: External ear normal.  Eyes: Conjunctivae are normal. Pupils are equal, round, and reactive to light.  Neck: Normal range of motion. Neck supple.  Cardiovascular: Normal rate and regular rhythm. Exam reveals no gallop and no friction rub.  No murmur heard. Respiratory: Effort normal and breath sounds normal. No respiratory distress. He has no wheezes. He has no rales.  GI: Soft. He exhibits distension. Bowel sounds are decreased. There is no tenderness.  Musculoskeletal:  1+ edema right hip and thigh, lower leg. Small incisions with foam dressing.  Neurological: He is alert and oriented to person, place, and time. No cranial nerve deficit. Coordination normal.  Right Hip and knee pain with lower extremity range of motion no pain with ankle range of motion No pain with left lower extremity range of motion Upper extremity strength 5/5 bilateral deltoids, biceps, triceps, grip Left lower extremity 4/5 and hip flexor and extensor ankle  dorsal flexion plantar flexion  Skin: Skin is warm and dry. Sacral area CDI no erythema  .Psychiatric: He has a normal mood and affect. His behavior is normal.     Assessment/Plan: 1. Functional deficits secondary to right femoral neck fracture which require 3+ hours per day of interdisciplinary therapy in a comprehensive inpatient rehab setting. Physiatrist is providing close team supervision and 24 hour management of active medical problems listed below. Physiatrist and rehab team continue to assess barriers to discharge/monitor patient progress toward functional and medical goals.  Home today. Ortho follow up  FIM: FIM - Bathing Bathing Steps Patient Completed: Chest, Right upper leg, Left upper leg, Right Arm, Left Arm, Abdomen, Front perineal area, Buttocks, Right lower leg (including foot), Left lower leg (including foot) Bathing: 5: Supervision: Safety issues/verbal cues  FIM - Upper Body Dressing/Undressing Upper body dressing/undressing steps patient completed: Thread/unthread right sleeve of pullover shirt/dresss, Thread/unthread left sleeve of pullover shirt/dress, Put head through opening of pull over shirt/dress, Pull shirt over trunk Upper body dressing/undressing: 6: More than reasonable amount of time FIM - Lower Body Dressing/Undressing Lower body dressing/undressing steps patient completed: Thread/unthread right pants leg, Thread/unthread left pants leg, Pull pants up/down, Don/Doff right sock, Don/Doff left sock, Don/Doff right shoe, Don/Doff left shoe, Fasten/unfasten right shoe, Fasten/unfasten left shoe Lower body dressing/undressing: 5: Set-up assist to: Obtain clothing  FIM - Toileting Toileting steps completed by patient: Adjust clothing prior to toileting, Adjust clothing after toileting, Performs perineal hygiene Toileting Assistive Devices: Grab bar or rail for  support Toileting: 5: Supervision: Safety issues/verbal cues  FIM - Sport and exercise psychologist Devices: Environmental consultant, Product manager Transfers: 5-To toilet/BSC: Supervision (verbal cues/safety issues), 5-From toilet/BSC: Supervision (verbal cues/safety issues)  FIM - Control and instrumentation engineer Devices: Walker, Arm rests Bed/Chair Transfer: 6: Supine > Sit: No assist, 6: Sit > Supine: No assist, 5: Bed > Chair or W/C: Supervision (verbal cues/safety issues), 5: Chair or W/C > Bed: Supervision (verbal cues/safety issues)  FIM - Locomotion: Wheelchair Distance: 175 Locomotion: Wheelchair: 6: Travels 150 ft or more, turns around, maneuvers to table, bed or toilet, negotiates 3% grade: maneuvers on rugs and over door sills independently FIM - Locomotion: Ambulation Locomotion: Ambulation Assistive Devices: Administrator Ambulation/Gait Assistance: 5: Supervision Locomotion: Ambulation: 5: Travels 150 ft or more with supervision/safety issues  Comprehension Comprehension Mode: Auditory Comprehension: 6-Follows complex conversation/direction: With extra time/assistive device  Expression Expression Mode: Verbal Expression: 5-Expresses complex 90% of the time/cues < 10% of the time  Social Interaction Social Interaction: 6-Interacts appropriately with others with medication or extra time (anti-anxiety, antidepressant).  Problem Solving Problem Solving: 5-Solves complex 90% of the time/cues < 10% of the time  Memory Memory: 6-More than reasonable amt of time   Medical Problem List and Plan: 1. Functional deficits secondary to right femoral neck fracture after fall s/p pinning 2. DVT Prophylaxis/Anticoagulation: Pharmaceutical: Lovenox until coumadin therapeutic  -elevate RLE when in bed/chair 3. Pain Management:     oxycodone prn. Chronic coccydynia---ice, prn muscle relaxant---improved 4. Mood: Motivated to get better. LCSW to follow for evaluation and support.  5. Neuropsych: This patient is capable of making decisions on his own  behalf. 6. Skin/Wound Care: Routine pressure relief measures. local care.  7. Fluids/Electrolytes/Nutrition: Monitor I/O. Encourage patient to push fluids.  8. DM type 2:   Will monitor BS with ac/hs checks. Resumed amaryl   -sugars reasonable at present 9. A fib: Will monitor HR every 8 hours. Continue amiodarone and atenolol for rate control.  10. Constipation:   results with supp and enema 11. HTN: Monitor BP every 8 hours. Continue lasix 40 mg daily as well as proscar and tenormin. 12. Acute on chronic anemia:   iron supplement.  13. Leukocytosis: no obvious source. Denies urinary symptoms. Wound looks good. Lungs clear  -wbc's improved  14.  Constipation adjusted bowel program LOS (Days) 9 A FACE TO FACE EVALUATION WAS PERFORMED  Patrica Mendell T 07/13/2014 9:03 AM

## 2014-07-13 NOTE — Discharge Summary (Signed)
Physician Discharge Summary  Patient ID: Christopher [name] MRN: [id number] DOB/AGE: 05-27-1926 81 y.o.  Admit date: 07/04/2014 Discharge date: 07/13/2014  Discharge Diagnoses:  Principal Problem:   Fracture of femoral neck, right Active Problems:   Atrial fibrillation   Hypertensive heart disease without CHF   Kidney disease, chronic, stage III (GFR 30-59 ml/min)   Discharged Condition:  Stable   Labs:  Basic Metabolic Panel:  Recent Labs Lab 07/07/14 0545  NA 142  K 4.8  CL 108  CO2 26  GLUCOSE 92  BUN 74*  CREATININE 2.65*  CALCIUM 8.8    CBC:  Recent Labs Lab 07/08/14 0505  WBC 10.0  HGB 9.0*  HCT 29.5*  MCV 92.8  PLT 403*    CBG:  Recent Labs Lab 07/12/14 0706 07/12/14 1218 07/12/14 1705 07/12/14 2054 07/13/14 0652  GLUCAP 90 101* 105* 84 92    Brief HPI:   Christopher [name] is a 81 y.o. male with H/o HTN, DM type 2, CAD with CABG/PPM, CKD- baseline Cr- 3.62, A Fib-chronic Coumadin; who was admitted on 06/28/2014 after a fall--No LOC and subsequent nondisplaced femoral neck fracture. He underwent cannulated hip pinning 06/30/2014 per Dr. Edmonia Lynch the same day and is WBAT RLE. Fall likely due to orthostatic symptoms and diuretics were held briefly. Post op coumadin resumed with Lovenox bridge and INR subtherapeutic at 1.25. Therapy ongoing and CIR was recommended by MD and rehab team   Hospital Course: Christopher [name] was admitted to rehab 07/04/2014 for inpatient therapies to consist of PT and OT at least three hours five days a week. Past admission physiatrist, therapy team and rehab RN have worked together to provide customized collaborative inpatient rehab. Blood pressures were monitored on tid basis and have been reasonably controlled without reports of dizziness or orthostatic symptoms.  He was advised to keep holding lasix and follow up with renal for input on further medication changes past discharge.  He had poor pain control with vicodin  therefore this was changed to oxycodone with good results. Currently hip pain is well controlled and chronic coccydynia is being managed with use of ice as well as prn use of muscle relaxers.  Mood has been stable and po intake has been good. Amaryl was resumed at admission and blood sugars have been well controlled. Follow up labs showed that ABLA to be stable and reactive leucocytosis has resolved. Patient has made good progress and is currently at supervision level. He will continue to receive follow up HHPT and [place] by [facility] past discharge.    Rehab course: During patient's stay in rehab weekly team conferences were held to monitor patient's progress, set goals and discuss barriers to discharge. At admission, patient required max assistance with ADL tasks and moderate assistance with mobility. He has had improvement in activity tolerance, balance, postural control, as well as ability to compensate for deficits. He is able to complete ADL tasks with supervision. He requires supervision for transfers and is able to ambulate > 150' with RW and supervision. Family education was done with wife who will provide assistance and cues needed past discharge.    Disposition:  Home   Diet: Diabetic.  Special Instructions: 1. HHRN to draw protime on 05/06 with results to Dr. Wynonia Lawman.  2. Wash incision with soap and water. Pat dry. No creams, ointments or lotions.  Contact MD if you notice any redness, swelling, drainage, fever or chills.      Medication List  STOP taking these medications        docusate sodium 100 MG capsule  Commonly known as:  COLACE     HYDROcodone-acetaminophen 5-325 MG per tablet  Commonly known as:  NORCO     hyoscyamine 0.125 MG tablet  Commonly known as:  LEVSIN, ANASPAZ      TAKE these medications        amiodarone 200 MG tablet  Commonly known as:  PACERONE  Take 200 mg by mouth every morning.     atenolol 25 MG tablet  Commonly known as:  TENORMIN   Take 25 mg by mouth every morning.     finasteride 5 MG tablet  Commonly known as:  PROSCAR  Take 5 mg by mouth daily.     Fish Oil 1200 MG Caps  Take 1,200 mg by mouth daily.     folic acid 222 MCG tablet  Commonly known as:  FOLVITE  Take 400 mcg by mouth daily.     furosemide 40 MG tablet  Commonly known as:  LASIX  Take 1 tablet (40 mg total) by mouth daily.     glimepiride 1 MG tablet  Commonly known as:  AMARYL  Take 1 tablet (1 mg total) by mouth daily before breakfast.     Glucosamine HCl 1500 MG Tabs  Take 1,500 mg by mouth 2 (two) times daily.     [place] 300 MG Caps  Take 1 capsule by mouth daily.     iron polysaccharides 150 MG capsule  Commonly known as:  NIFEREX  Take 1 capsule (150 mg total) by mouth 2 (two) times daily before lunch and supper.     methocarbamol 500 MG tablet--Rx #60 pills  Commonly known as:  ROBAXIN  Take 1 tablet (500 mg total) by mouth every 6 (six) hours as needed for muscle spasms.     multivitamin with minerals Tabs tablet  Take 1 tablet by mouth daily.     oxyCODONE 5 MG immediate release tablet--Rx #100 pills  Commonly known as:  Oxy IR/ROXICODONE  Take 1-2 tablets (5-10 mg total) by mouth every 6 (six) hours as needed for moderate pain.     senna-docusate 8.6-50 MG per tablet  Commonly known as:  Senokot-S  Take 2 tablets by mouth 2 (two) times daily.     warfarin 4 MG tablet  Commonly known as:  COUMADIN  Take 4 mg by mouth every evening.           Follow-up Information    Follow up with Tivis Ringer, MD.   Specialty:  Internal Medicine   Why:  Dr. Danna Hefty office will call you when you get home to schedule a post-hospital follow up visit.   Contact information:   [address] [place] [address] [phone]       Call [name], MD.   Specialty:  Physical Medicine and Rehabilitation   Why:  As needed   Contact information:   [address] [place]  [address] [phone]       Follow up with Renette Butters, MD. Call today.   Specialty:  Orthopedic Surgery   Why:  for follow up appointment   Contact information:   [address] [place] [address] [phone]       Follow up with Kerry Hough, MD. Call today.   Specialty:  Cardiology   Why:  for followup appointment    Contact information:   [address] [place] [address] [phone]       Signed: Bary Leriche 07/13/2014, 9:57 AM

## 2014-07-13 NOTE — Progress Notes (Signed)
Patient and family received discharge instructions from Pam Love, PA-C with verbal understanding. Patient discharged home with family and belongings. 

## 2014-07-13 NOTE — Progress Notes (Signed)
Social Work Patient ID: Christopher [name], male   DOB: [birth year], 81 y.o.   MRN: [id number]   CSW met with pt and his wife on 07-12-14 to update them on team conference note and to make sure they are comfortable with pt's d/c on 07-13-14 and that they have what they need at home.  CSW will arrange [facility] for PT/RN with [facility] and ordered rolling walker and bedside commode from [place].  Pt already has handicap placard for the car.  CSW also informed pt/wife of when f/u appts will be and that this will be listed on d/c instructions as well.  They feel prepared for pt at home and no other needs/concerns/questions at this time.  CSW remains available as needed.

## 2014-07-13 NOTE — Progress Notes (Signed)
Social Work Discharge Note  The overall goal for the admission was met for:   Discharge location: Yes - home  Length of Stay: Yes - 9 days  Discharge activity level: Yes - supervision  Home/community participation: Yes  Services provided included: MD, RD, PT, OT, RN, Pharmacy and SW  Financial Services: Medicare and Private Insurance: [employer]  Follow-up services arranged: Home Health: RN/PT, DME: rolling walker and bedside commode and Patient/Family has no preference for HH/DME agencies  Comments (or additional information): Pt will go home to his home with his wife to provide 24/7 supervision and assistance as needed.  They both feel prepared to go home.  Patient/Family verbalized understanding of follow-up arrangements: Yes  Individual responsible for coordination of the follow-up plan: pt with his wife  Confirmed correct DME delivered: Trey Sailors 07/13/2014    Montoya Watkin, Silvestre Mesi

## 2014-07-14 NOTE — Progress Notes (Signed)
Social Work Patient ID: Christopher [name], male   DOB: 07/11/1926, 81 y.o.   MRN: [id number]   Lynnda Child, LCSW Social Worker Signed  Patient Care Conference 07/13/2014 11:43 AM    Expand All Collapse All   Inpatient RehabilitationTeam Conference and Plan of Care Update Date: 07/12/2014   Time: 2:15 PM     Patient Name: Christopher [name]       Medical Record Number: [id number]  Date of Birth: Jan 03, 1927 Sex: Male         Room/Bed: 4W19C/4W19C-01 Payor Info: Payor: MEDICARE / Plan: MEDICARE PART A AND B / Product Type: *No Product type* /    Admitting Diagnosis: RT HIP FX S P PINNNG   Admit Date/Time:  07/04/2014  6:08 PM Admission Comments: No comment available   Primary Diagnosis:  Fracture of femoral neck, right Principal Problem: Fracture of femoral neck, right    Patient Active Problem List     Diagnosis  Date Noted   .  Fracture of femoral neck, right     .  Intertrochanteric fracture of left hip     .  Closed right hip fracture  06/28/2014   .  CHF (congestive heart failure)  06/28/2014   .  Hip fracture  06/28/2014   .  Fall     .  Acute diastolic congestive heart failure, NYHA class 2  07/24/2013   .  Acute respiratory failure  07/24/2013   .  DM type 2 causing renal disease  07/24/2013   .  Shortness of breath  07/24/2013   .  Malignant neoplasm of prostate  07/16/2013   .  History of amiodarone therapy  07/16/2012   .  Atrial fibrillation  08/07/2011   .  Lumbar disc disease     .  Hyperlipidemia     .  Hypertensive heart disease without CHF     .  Kidney disease, chronic, stage III (GFR 30-59 ml/min)     .  Long-term (current) use of anticoagulants     .  BPH (benign prostatic hyperplasia)     .  Cardiac pacemaker in situ     .  CAD (coronary artery disease)  03/06/2010     Expected Discharge Date: Expected Discharge Date: 07/13/14  Team Members Present: Physician leading conference: Dr. Alger Simons Social Worker Present: Alfonse Alpers, LCSW Nurse Present:  Dorien Chihuahua, RN PT Present: Canary Brim, Lorriane Shire, PT OT Present: Other (comment) Napoleon Form, OT) SLP Present: Weston Anna, SLP PPS Coordinator present : Daiva Nakayama, RN, CRRN        Current Status/Progress  Goal  Weekly Team Focus   Medical     right leg stronger. some coccodynea improved today.   finalzie dc planning  finalize dc planning   Bowel/Bladder     continent bowel and bladder  To continue continent to bowel and bladder.   monitor bowel and blader function q shift    Swallow/Nutrition/ Hydration               ADL's     Overall Supervision bathing and dressing; supervision-min A Functional transfers  Sueprvision functional transfers; [place] I dressing   Education on AE; functional standing endurance/ activity tolerance   Mobility     S to steady A overall  S overall; min A car   d/c planning, endurance, strengthening, gait, balance, stairs    Communication  Safety/Cognition/ Behavioral Observations              Pain     Rt. hip pain using Oxy IR 5mg . PRN   To keep pain levels less than 3.On scale 1 to 10.  Assess pain q 4 hrs. and PRN   Skin     RUE skin tear with tegaderm, incision to Rt. hip healed   To keep skin free of breakdown.  assess skin q shift    Rehab Goals Patient on target to meet rehab goals: Yes Rehab Goals Revised: none *See Care Plan and progress notes for long and short-term goals.    Barriers to Discharge:  none     Possible Resolutions to Barriers:   none     Discharge Planning/Teaching Needs:   Pt plans to return to his home to his wife who will provide 24/7 supervision  Family education with pt's wife has been completed.    Team Discussion:    Pt is doing well and is ready for d/c.  He will need a rolling walker and bedside commode with [place] for PT and RN.   Revisions to Treatment Plan:    None    Continued Need for Acute Rehabilitation Level of Care: The patient requires daily medical management by a  physician with specialized training in physical medicine and rehabilitation for the following conditions: Daily direction of a multidisciplinary physical rehabilitation program to ensure safe treatment while eliciting the highest outcome that is of practical value to the patient.: Yes Daily medical management of patient stability for increased activity during participation in an intensive rehabilitation regime.: Yes Daily analysis of laboratory values and/or radiology reports with any subsequent need for medication adjustment of medical intervention for : Post surgical problems;Other  Margeaux Swantek, Silvestre Mesi 07/14/2014, 8:42 AM

## 2014-07-15 DIAGNOSIS — N184 Chronic kidney disease, stage 4 (severe): Secondary | ICD-10-CM | POA: Diagnosis not present

## 2014-07-15 DIAGNOSIS — I129 Hypertensive chronic kidney disease with stage 1 through stage 4 chronic kidney disease, or unspecified chronic kidney disease: Secondary | ICD-10-CM | POA: Diagnosis not present

## 2014-07-15 DIAGNOSIS — S72001D Fracture of unspecified part of neck of right femur, subsequent encounter for closed fracture with routine healing: Secondary | ICD-10-CM | POA: Diagnosis not present

## 2014-07-15 DIAGNOSIS — I5030 Unspecified diastolic (congestive) heart failure: Secondary | ICD-10-CM | POA: Diagnosis not present

## 2014-07-15 DIAGNOSIS — E119 Type 2 diabetes mellitus without complications: Secondary | ICD-10-CM | POA: Diagnosis not present

## 2014-07-15 DIAGNOSIS — I4891 Unspecified atrial fibrillation: Secondary | ICD-10-CM | POA: Diagnosis not present

## 2014-07-15 NOTE — Discharge Instructions (Signed)
Inpatient Rehab Discharge Instructions  [name] Discharge date and time:    Activities/Precautions/ Functional Status: Activity: activity as tolerated Diet: diabetic diet Wound Care: Wash incision with soap and water. Pat dry. No creams, ointments or lotions.  Contact MD if you notice any redness, swelling, drainage, fever or chills.  Functional status:  ___ No restrictions     ___ Walk up steps independently ___ 24/7 supervision/assistance   ___ Walk up steps with assistance ___ Intermittent supervision/assistance  ___ Bathe/dress independently ___ Walk with walker     ___ Bathe/dress with assistance ___ Walk Independently    ___ Shower independently ___ Walk with assistance    ___ Shower with assistance ___ No alcohol     ___ Return to work/school ________  COMMUNITY REFERRALS UPON DISCHARGE:   Home Health:   PT     RN    Agency:  [facility] Phone:  [phone] Medical Equipment/Items Ordered:  Rolling walker and bedside commode  Agency/Supplier:  [facility]     Phone:  [phone]  Special Instructions:    My questions have been answered and I understand these instructions. I will adhere to these goals and the provided educational materials after my discharge from the hospital.  Patient/Caregiver Signature _______________________________ Date __________  Clinician Signature _______________________________________ Date __________  Please bring this form and your medication list with you to all your follow-up doctor's appointments.

## 2014-07-22 DIAGNOSIS — E119 Type 2 diabetes mellitus without complications: Secondary | ICD-10-CM | POA: Diagnosis not present

## 2014-07-22 DIAGNOSIS — I5030 Unspecified diastolic (congestive) heart failure: Secondary | ICD-10-CM | POA: Diagnosis not present

## 2014-07-22 DIAGNOSIS — S72001D Fracture of unspecified part of neck of right femur, subsequent encounter for closed fracture with routine healing: Secondary | ICD-10-CM | POA: Diagnosis not present

## 2014-07-22 DIAGNOSIS — I4891 Unspecified atrial fibrillation: Secondary | ICD-10-CM | POA: Diagnosis not present

## 2014-07-22 DIAGNOSIS — N184 Chronic kidney disease, stage 4 (severe): Secondary | ICD-10-CM | POA: Diagnosis not present

## 2014-07-22 DIAGNOSIS — I129 Hypertensive chronic kidney disease with stage 1 through stage 4 chronic kidney disease, or unspecified chronic kidney disease: Secondary | ICD-10-CM | POA: Diagnosis not present

## 2014-07-25 DIAGNOSIS — I509 Heart failure, unspecified: Secondary | ICD-10-CM | POA: Diagnosis not present

## 2014-07-25 DIAGNOSIS — D509 Iron deficiency anemia, unspecified: Secondary | ICD-10-CM | POA: Diagnosis not present

## 2014-07-25 DIAGNOSIS — D649 Anemia, unspecified: Secondary | ICD-10-CM | POA: Diagnosis not present

## 2014-07-25 DIAGNOSIS — J189 Pneumonia, unspecified organism: Secondary | ICD-10-CM | POA: Diagnosis not present

## 2014-07-25 DIAGNOSIS — N184 Chronic kidney disease, stage 4 (severe): Secondary | ICD-10-CM | POA: Diagnosis not present

## 2014-07-25 DIAGNOSIS — E1129 Type 2 diabetes mellitus with other diabetic kidney complication: Secondary | ICD-10-CM | POA: Diagnosis not present

## 2014-07-27 DIAGNOSIS — S72001D Fracture of unspecified part of neck of right femur, subsequent encounter for closed fracture with routine healing: Secondary | ICD-10-CM | POA: Diagnosis not present

## 2014-07-27 DIAGNOSIS — I5030 Unspecified diastolic (congestive) heart failure: Secondary | ICD-10-CM | POA: Diagnosis not present

## 2014-07-27 DIAGNOSIS — N184 Chronic kidney disease, stage 4 (severe): Secondary | ICD-10-CM | POA: Diagnosis not present

## 2014-07-27 DIAGNOSIS — I4891 Unspecified atrial fibrillation: Secondary | ICD-10-CM | POA: Diagnosis not present

## 2014-07-27 DIAGNOSIS — E119 Type 2 diabetes mellitus without complications: Secondary | ICD-10-CM | POA: Diagnosis not present

## 2014-07-27 DIAGNOSIS — I129 Hypertensive chronic kidney disease with stage 1 through stage 4 chronic kidney disease, or unspecified chronic kidney disease: Secondary | ICD-10-CM | POA: Diagnosis not present

## 2014-07-28 DIAGNOSIS — I480 Paroxysmal atrial fibrillation: Secondary | ICD-10-CM | POA: Diagnosis not present

## 2014-07-28 DIAGNOSIS — Z7901 Long term (current) use of anticoagulants: Secondary | ICD-10-CM | POA: Diagnosis not present

## 2014-07-28 DIAGNOSIS — Z79899 Other long term (current) drug therapy: Secondary | ICD-10-CM | POA: Diagnosis not present

## 2014-07-28 DIAGNOSIS — Z950 Presence of cardiac pacemaker: Secondary | ICD-10-CM | POA: Diagnosis not present

## 2014-07-28 DIAGNOSIS — E1122 Type 2 diabetes mellitus with diabetic chronic kidney disease: Secondary | ICD-10-CM | POA: Diagnosis not present

## 2014-07-28 DIAGNOSIS — E785 Hyperlipidemia, unspecified: Secondary | ICD-10-CM | POA: Diagnosis not present

## 2014-07-28 DIAGNOSIS — I2510 Atherosclerotic heart disease of native coronary artery without angina pectoris: Secondary | ICD-10-CM | POA: Diagnosis not present

## 2014-07-28 DIAGNOSIS — I119 Hypertensive heart disease without heart failure: Secondary | ICD-10-CM | POA: Diagnosis not present

## 2014-07-28 DIAGNOSIS — N184 Chronic kidney disease, stage 4 (severe): Secondary | ICD-10-CM | POA: Diagnosis not present

## 2014-07-28 DIAGNOSIS — Z951 Presence of aortocoronary bypass graft: Secondary | ICD-10-CM | POA: Diagnosis not present

## 2014-07-28 DIAGNOSIS — I5032 Chronic diastolic (congestive) heart failure: Secondary | ICD-10-CM | POA: Diagnosis not present

## 2014-07-29 DIAGNOSIS — S72001D Fracture of unspecified part of neck of right femur, subsequent encounter for closed fracture with routine healing: Secondary | ICD-10-CM | POA: Diagnosis not present

## 2014-07-29 DIAGNOSIS — I5030 Unspecified diastolic (congestive) heart failure: Secondary | ICD-10-CM | POA: Diagnosis not present

## 2014-07-29 DIAGNOSIS — E119 Type 2 diabetes mellitus without complications: Secondary | ICD-10-CM | POA: Diagnosis not present

## 2014-07-29 DIAGNOSIS — I4891 Unspecified atrial fibrillation: Secondary | ICD-10-CM | POA: Diagnosis not present

## 2014-07-29 DIAGNOSIS — I129 Hypertensive chronic kidney disease with stage 1 through stage 4 chronic kidney disease, or unspecified chronic kidney disease: Secondary | ICD-10-CM | POA: Diagnosis not present

## 2014-07-29 DIAGNOSIS — N184 Chronic kidney disease, stage 4 (severe): Secondary | ICD-10-CM | POA: Diagnosis not present

## 2014-08-01 DIAGNOSIS — I4891 Unspecified atrial fibrillation: Secondary | ICD-10-CM | POA: Diagnosis not present

## 2014-08-01 DIAGNOSIS — S72001D Fracture of unspecified part of neck of right femur, subsequent encounter for closed fracture with routine healing: Secondary | ICD-10-CM | POA: Diagnosis not present

## 2014-08-01 DIAGNOSIS — E119 Type 2 diabetes mellitus without complications: Secondary | ICD-10-CM | POA: Diagnosis not present

## 2014-08-01 DIAGNOSIS — I129 Hypertensive chronic kidney disease with stage 1 through stage 4 chronic kidney disease, or unspecified chronic kidney disease: Secondary | ICD-10-CM | POA: Diagnosis not present

## 2014-08-01 DIAGNOSIS — N184 Chronic kidney disease, stage 4 (severe): Secondary | ICD-10-CM | POA: Diagnosis not present

## 2014-08-01 DIAGNOSIS — I5030 Unspecified diastolic (congestive) heart failure: Secondary | ICD-10-CM | POA: Diagnosis not present

## 2014-08-02 DIAGNOSIS — E119 Type 2 diabetes mellitus without complications: Secondary | ICD-10-CM | POA: Diagnosis not present

## 2014-08-02 DIAGNOSIS — S72001D Fracture of unspecified part of neck of right femur, subsequent encounter for closed fracture with routine healing: Secondary | ICD-10-CM | POA: Diagnosis not present

## 2014-08-02 DIAGNOSIS — I129 Hypertensive chronic kidney disease with stage 1 through stage 4 chronic kidney disease, or unspecified chronic kidney disease: Secondary | ICD-10-CM | POA: Diagnosis not present

## 2014-08-02 DIAGNOSIS — I4891 Unspecified atrial fibrillation: Secondary | ICD-10-CM | POA: Diagnosis not present

## 2014-08-02 DIAGNOSIS — N184 Chronic kidney disease, stage 4 (severe): Secondary | ICD-10-CM | POA: Diagnosis not present

## 2014-08-02 DIAGNOSIS — I5030 Unspecified diastolic (congestive) heart failure: Secondary | ICD-10-CM | POA: Diagnosis not present

## 2014-08-05 DIAGNOSIS — E119 Type 2 diabetes mellitus without complications: Secondary | ICD-10-CM | POA: Diagnosis not present

## 2014-08-05 DIAGNOSIS — I129 Hypertensive chronic kidney disease with stage 1 through stage 4 chronic kidney disease, or unspecified chronic kidney disease: Secondary | ICD-10-CM | POA: Diagnosis not present

## 2014-08-05 DIAGNOSIS — I4891 Unspecified atrial fibrillation: Secondary | ICD-10-CM | POA: Diagnosis not present

## 2014-08-05 DIAGNOSIS — S72001D Fracture of unspecified part of neck of right femur, subsequent encounter for closed fracture with routine healing: Secondary | ICD-10-CM | POA: Diagnosis not present

## 2014-08-05 DIAGNOSIS — N184 Chronic kidney disease, stage 4 (severe): Secondary | ICD-10-CM | POA: Diagnosis not present

## 2014-08-05 DIAGNOSIS — I5030 Unspecified diastolic (congestive) heart failure: Secondary | ICD-10-CM | POA: Diagnosis not present

## 2014-08-09 DIAGNOSIS — N184 Chronic kidney disease, stage 4 (severe): Secondary | ICD-10-CM | POA: Diagnosis not present

## 2014-08-09 DIAGNOSIS — S72001D Fracture of unspecified part of neck of right femur, subsequent encounter for closed fracture with routine healing: Secondary | ICD-10-CM | POA: Diagnosis not present

## 2014-08-09 DIAGNOSIS — I5030 Unspecified diastolic (congestive) heart failure: Secondary | ICD-10-CM | POA: Diagnosis not present

## 2014-08-09 DIAGNOSIS — I129 Hypertensive chronic kidney disease with stage 1 through stage 4 chronic kidney disease, or unspecified chronic kidney disease: Secondary | ICD-10-CM | POA: Diagnosis not present

## 2014-08-09 DIAGNOSIS — I4891 Unspecified atrial fibrillation: Secondary | ICD-10-CM | POA: Diagnosis not present

## 2014-08-09 DIAGNOSIS — E119 Type 2 diabetes mellitus without complications: Secondary | ICD-10-CM | POA: Diagnosis not present

## 2014-08-11 DIAGNOSIS — I509 Heart failure, unspecified: Secondary | ICD-10-CM | POA: Diagnosis not present

## 2014-08-11 DIAGNOSIS — E1129 Type 2 diabetes mellitus with other diabetic kidney complication: Secondary | ICD-10-CM | POA: Diagnosis not present

## 2014-08-11 DIAGNOSIS — I2510 Atherosclerotic heart disease of native coronary artery without angina pectoris: Secondary | ICD-10-CM | POA: Diagnosis not present

## 2014-08-11 DIAGNOSIS — S72001D Fracture of unspecified part of neck of right femur, subsequent encounter for closed fracture with routine healing: Secondary | ICD-10-CM | POA: Diagnosis not present

## 2014-08-11 DIAGNOSIS — I4891 Unspecified atrial fibrillation: Secondary | ICD-10-CM | POA: Diagnosis not present

## 2014-08-11 DIAGNOSIS — N184 Chronic kidney disease, stage 4 (severe): Secondary | ICD-10-CM | POA: Diagnosis not present

## 2014-08-11 DIAGNOSIS — Z950 Presence of cardiac pacemaker: Secondary | ICD-10-CM | POA: Diagnosis not present

## 2014-08-11 DIAGNOSIS — D509 Iron deficiency anemia, unspecified: Secondary | ICD-10-CM | POA: Diagnosis not present

## 2014-08-11 DIAGNOSIS — Z6831 Body mass index (BMI) 31.0-31.9, adult: Secondary | ICD-10-CM | POA: Diagnosis not present

## 2014-08-11 DIAGNOSIS — I129 Hypertensive chronic kidney disease with stage 1 through stage 4 chronic kidney disease, or unspecified chronic kidney disease: Secondary | ICD-10-CM | POA: Diagnosis not present

## 2014-08-11 DIAGNOSIS — S72009A Fracture of unspecified part of neck of unspecified femur, initial encounter for closed fracture: Secondary | ICD-10-CM | POA: Diagnosis not present

## 2014-08-11 DIAGNOSIS — I10 Essential (primary) hypertension: Secondary | ICD-10-CM | POA: Diagnosis not present

## 2014-08-11 DIAGNOSIS — I5030 Unspecified diastolic (congestive) heart failure: Secondary | ICD-10-CM | POA: Diagnosis not present

## 2014-08-11 DIAGNOSIS — E119 Type 2 diabetes mellitus without complications: Secondary | ICD-10-CM | POA: Diagnosis not present

## 2014-08-12 DIAGNOSIS — I4891 Unspecified atrial fibrillation: Secondary | ICD-10-CM | POA: Diagnosis not present

## 2014-08-12 DIAGNOSIS — I129 Hypertensive chronic kidney disease with stage 1 through stage 4 chronic kidney disease, or unspecified chronic kidney disease: Secondary | ICD-10-CM | POA: Diagnosis not present

## 2014-08-12 DIAGNOSIS — S72001D Fracture of unspecified part of neck of right femur, subsequent encounter for closed fracture with routine healing: Secondary | ICD-10-CM | POA: Diagnosis not present

## 2014-08-12 DIAGNOSIS — E119 Type 2 diabetes mellitus without complications: Secondary | ICD-10-CM | POA: Diagnosis not present

## 2014-08-12 DIAGNOSIS — N184 Chronic kidney disease, stage 4 (severe): Secondary | ICD-10-CM | POA: Diagnosis not present

## 2014-08-12 DIAGNOSIS — I5030 Unspecified diastolic (congestive) heart failure: Secondary | ICD-10-CM | POA: Diagnosis not present

## 2014-08-15 DIAGNOSIS — S72001D Fracture of unspecified part of neck of right femur, subsequent encounter for closed fracture with routine healing: Secondary | ICD-10-CM | POA: Diagnosis not present

## 2014-08-15 DIAGNOSIS — N184 Chronic kidney disease, stage 4 (severe): Secondary | ICD-10-CM | POA: Diagnosis not present

## 2014-08-15 DIAGNOSIS — E119 Type 2 diabetes mellitus without complications: Secondary | ICD-10-CM | POA: Diagnosis not present

## 2014-08-15 DIAGNOSIS — I4891 Unspecified atrial fibrillation: Secondary | ICD-10-CM | POA: Diagnosis not present

## 2014-08-15 DIAGNOSIS — I129 Hypertensive chronic kidney disease with stage 1 through stage 4 chronic kidney disease, or unspecified chronic kidney disease: Secondary | ICD-10-CM | POA: Diagnosis not present

## 2014-08-15 DIAGNOSIS — I5030 Unspecified diastolic (congestive) heart failure: Secondary | ICD-10-CM | POA: Diagnosis not present

## 2014-08-17 DIAGNOSIS — I129 Hypertensive chronic kidney disease with stage 1 through stage 4 chronic kidney disease, or unspecified chronic kidney disease: Secondary | ICD-10-CM | POA: Diagnosis not present

## 2014-08-17 DIAGNOSIS — I4891 Unspecified atrial fibrillation: Secondary | ICD-10-CM | POA: Diagnosis not present

## 2014-08-17 DIAGNOSIS — S72001D Fracture of unspecified part of neck of right femur, subsequent encounter for closed fracture with routine healing: Secondary | ICD-10-CM | POA: Diagnosis not present

## 2014-08-17 DIAGNOSIS — I5030 Unspecified diastolic (congestive) heart failure: Secondary | ICD-10-CM | POA: Diagnosis not present

## 2014-08-17 DIAGNOSIS — N184 Chronic kidney disease, stage 4 (severe): Secondary | ICD-10-CM | POA: Diagnosis not present

## 2014-08-17 DIAGNOSIS — E119 Type 2 diabetes mellitus without complications: Secondary | ICD-10-CM | POA: Diagnosis not present

## 2014-08-22 DIAGNOSIS — S72001D Fracture of unspecified part of neck of right femur, subsequent encounter for closed fracture with routine healing: Secondary | ICD-10-CM | POA: Diagnosis not present

## 2014-08-22 DIAGNOSIS — E119 Type 2 diabetes mellitus without complications: Secondary | ICD-10-CM | POA: Diagnosis not present

## 2014-08-22 DIAGNOSIS — N184 Chronic kidney disease, stage 4 (severe): Secondary | ICD-10-CM | POA: Diagnosis not present

## 2014-08-22 DIAGNOSIS — I129 Hypertensive chronic kidney disease with stage 1 through stage 4 chronic kidney disease, or unspecified chronic kidney disease: Secondary | ICD-10-CM | POA: Diagnosis not present

## 2014-08-22 DIAGNOSIS — I5030 Unspecified diastolic (congestive) heart failure: Secondary | ICD-10-CM | POA: Diagnosis not present

## 2014-08-22 DIAGNOSIS — I4891 Unspecified atrial fibrillation: Secondary | ICD-10-CM | POA: Diagnosis not present

## 2014-08-24 DIAGNOSIS — D1801 Hemangioma of skin and subcutaneous tissue: Secondary | ICD-10-CM | POA: Diagnosis not present

## 2014-08-24 DIAGNOSIS — L570 Actinic keratosis: Secondary | ICD-10-CM | POA: Diagnosis not present

## 2014-08-24 DIAGNOSIS — I5030 Unspecified diastolic (congestive) heart failure: Secondary | ICD-10-CM | POA: Diagnosis not present

## 2014-08-24 DIAGNOSIS — B078 Other viral warts: Secondary | ICD-10-CM | POA: Diagnosis not present

## 2014-08-24 DIAGNOSIS — E119 Type 2 diabetes mellitus without complications: Secondary | ICD-10-CM | POA: Diagnosis not present

## 2014-08-24 DIAGNOSIS — B351 Tinea unguium: Secondary | ICD-10-CM | POA: Diagnosis not present

## 2014-08-24 DIAGNOSIS — S72001D Fracture of unspecified part of neck of right femur, subsequent encounter for closed fracture with routine healing: Secondary | ICD-10-CM | POA: Diagnosis not present

## 2014-08-24 DIAGNOSIS — Z85828 Personal history of other malignant neoplasm of skin: Secondary | ICD-10-CM | POA: Diagnosis not present

## 2014-08-24 DIAGNOSIS — D225 Melanocytic nevi of trunk: Secondary | ICD-10-CM | POA: Diagnosis not present

## 2014-08-24 DIAGNOSIS — N184 Chronic kidney disease, stage 4 (severe): Secondary | ICD-10-CM | POA: Diagnosis not present

## 2014-08-24 DIAGNOSIS — I129 Hypertensive chronic kidney disease with stage 1 through stage 4 chronic kidney disease, or unspecified chronic kidney disease: Secondary | ICD-10-CM | POA: Diagnosis not present

## 2014-08-24 DIAGNOSIS — M545 Low back pain: Secondary | ICD-10-CM | POA: Diagnosis not present

## 2014-08-24 DIAGNOSIS — L821 Other seborrheic keratosis: Secondary | ICD-10-CM | POA: Diagnosis not present

## 2014-08-24 DIAGNOSIS — I4891 Unspecified atrial fibrillation: Secondary | ICD-10-CM | POA: Diagnosis not present

## 2014-08-25 ENCOUNTER — Other Ambulatory Visit: Payer: Self-pay | Admitting: Urology

## 2014-08-25 DIAGNOSIS — S72001D Fracture of unspecified part of neck of right femur, subsequent encounter for closed fracture with routine healing: Secondary | ICD-10-CM | POA: Diagnosis not present

## 2014-08-25 DIAGNOSIS — I4891 Unspecified atrial fibrillation: Secondary | ICD-10-CM | POA: Diagnosis not present

## 2014-08-25 DIAGNOSIS — C61 Malignant neoplasm of prostate: Secondary | ICD-10-CM

## 2014-08-25 DIAGNOSIS — D631 Anemia in chronic kidney disease: Secondary | ICD-10-CM | POA: Diagnosis not present

## 2014-08-25 DIAGNOSIS — I5030 Unspecified diastolic (congestive) heart failure: Secondary | ICD-10-CM | POA: Diagnosis not present

## 2014-08-25 DIAGNOSIS — N2581 Secondary hyperparathyroidism of renal origin: Secondary | ICD-10-CM | POA: Diagnosis not present

## 2014-08-25 DIAGNOSIS — E1129 Type 2 diabetes mellitus with other diabetic kidney complication: Secondary | ICD-10-CM | POA: Diagnosis not present

## 2014-08-25 DIAGNOSIS — I129 Hypertensive chronic kidney disease with stage 1 through stage 4 chronic kidney disease, or unspecified chronic kidney disease: Secondary | ICD-10-CM | POA: Diagnosis not present

## 2014-08-25 DIAGNOSIS — E119 Type 2 diabetes mellitus without complications: Secondary | ICD-10-CM | POA: Diagnosis not present

## 2014-08-25 DIAGNOSIS — N184 Chronic kidney disease, stage 4 (severe): Secondary | ICD-10-CM | POA: Diagnosis not present

## 2014-08-25 DIAGNOSIS — Z79899 Other long term (current) drug therapy: Secondary | ICD-10-CM

## 2014-08-26 ENCOUNTER — Other Ambulatory Visit: Payer: Self-pay | Admitting: Orthopedic Surgery

## 2014-08-26 DIAGNOSIS — H11003 Unspecified pterygium of eye, bilateral: Secondary | ICD-10-CM | POA: Diagnosis not present

## 2014-08-26 DIAGNOSIS — E119 Type 2 diabetes mellitus without complications: Secondary | ICD-10-CM | POA: Diagnosis not present

## 2014-08-26 DIAGNOSIS — T148XXA Other injury of unspecified body region, initial encounter: Secondary | ICD-10-CM

## 2014-08-26 DIAGNOSIS — H43813 Vitreous degeneration, bilateral: Secondary | ICD-10-CM | POA: Diagnosis not present

## 2014-08-26 DIAGNOSIS — H532 Diplopia: Secondary | ICD-10-CM | POA: Diagnosis not present

## 2014-08-29 DIAGNOSIS — I5030 Unspecified diastolic (congestive) heart failure: Secondary | ICD-10-CM | POA: Diagnosis not present

## 2014-08-29 DIAGNOSIS — N184 Chronic kidney disease, stage 4 (severe): Secondary | ICD-10-CM | POA: Diagnosis not present

## 2014-08-29 DIAGNOSIS — I4891 Unspecified atrial fibrillation: Secondary | ICD-10-CM | POA: Diagnosis not present

## 2014-08-29 DIAGNOSIS — E119 Type 2 diabetes mellitus without complications: Secondary | ICD-10-CM | POA: Diagnosis not present

## 2014-08-29 DIAGNOSIS — S72001D Fracture of unspecified part of neck of right femur, subsequent encounter for closed fracture with routine healing: Secondary | ICD-10-CM | POA: Diagnosis not present

## 2014-08-29 DIAGNOSIS — I129 Hypertensive chronic kidney disease with stage 1 through stage 4 chronic kidney disease, or unspecified chronic kidney disease: Secondary | ICD-10-CM | POA: Diagnosis not present

## 2014-09-01 DIAGNOSIS — I129 Hypertensive chronic kidney disease with stage 1 through stage 4 chronic kidney disease, or unspecified chronic kidney disease: Secondary | ICD-10-CM | POA: Diagnosis not present

## 2014-09-01 DIAGNOSIS — I5030 Unspecified diastolic (congestive) heart failure: Secondary | ICD-10-CM | POA: Diagnosis not present

## 2014-09-01 DIAGNOSIS — S72001D Fracture of unspecified part of neck of right femur, subsequent encounter for closed fracture with routine healing: Secondary | ICD-10-CM | POA: Diagnosis not present

## 2014-09-01 DIAGNOSIS — I4891 Unspecified atrial fibrillation: Secondary | ICD-10-CM | POA: Diagnosis not present

## 2014-09-01 DIAGNOSIS — E119 Type 2 diabetes mellitus without complications: Secondary | ICD-10-CM | POA: Diagnosis not present

## 2014-09-01 DIAGNOSIS — N184 Chronic kidney disease, stage 4 (severe): Secondary | ICD-10-CM | POA: Diagnosis not present

## 2014-09-02 DIAGNOSIS — S72001D Fracture of unspecified part of neck of right femur, subsequent encounter for closed fracture with routine healing: Secondary | ICD-10-CM | POA: Diagnosis not present

## 2014-09-02 DIAGNOSIS — E119 Type 2 diabetes mellitus without complications: Secondary | ICD-10-CM | POA: Diagnosis not present

## 2014-09-02 DIAGNOSIS — I4891 Unspecified atrial fibrillation: Secondary | ICD-10-CM | POA: Diagnosis not present

## 2014-09-02 DIAGNOSIS — I5030 Unspecified diastolic (congestive) heart failure: Secondary | ICD-10-CM | POA: Diagnosis not present

## 2014-09-02 DIAGNOSIS — I129 Hypertensive chronic kidney disease with stage 1 through stage 4 chronic kidney disease, or unspecified chronic kidney disease: Secondary | ICD-10-CM | POA: Diagnosis not present

## 2014-09-02 DIAGNOSIS — N184 Chronic kidney disease, stage 4 (severe): Secondary | ICD-10-CM | POA: Diagnosis not present

## 2014-09-06 DIAGNOSIS — E119 Type 2 diabetes mellitus without complications: Secondary | ICD-10-CM | POA: Diagnosis not present

## 2014-09-06 DIAGNOSIS — I5030 Unspecified diastolic (congestive) heart failure: Secondary | ICD-10-CM | POA: Diagnosis not present

## 2014-09-06 DIAGNOSIS — N184 Chronic kidney disease, stage 4 (severe): Secondary | ICD-10-CM | POA: Diagnosis not present

## 2014-09-06 DIAGNOSIS — I4891 Unspecified atrial fibrillation: Secondary | ICD-10-CM | POA: Diagnosis not present

## 2014-09-06 DIAGNOSIS — S72001D Fracture of unspecified part of neck of right femur, subsequent encounter for closed fracture with routine healing: Secondary | ICD-10-CM | POA: Diagnosis not present

## 2014-09-06 DIAGNOSIS — I129 Hypertensive chronic kidney disease with stage 1 through stage 4 chronic kidney disease, or unspecified chronic kidney disease: Secondary | ICD-10-CM | POA: Diagnosis not present

## 2014-09-07 DIAGNOSIS — I129 Hypertensive chronic kidney disease with stage 1 through stage 4 chronic kidney disease, or unspecified chronic kidney disease: Secondary | ICD-10-CM | POA: Diagnosis not present

## 2014-09-07 DIAGNOSIS — N184 Chronic kidney disease, stage 4 (severe): Secondary | ICD-10-CM | POA: Diagnosis not present

## 2014-09-07 DIAGNOSIS — S72001D Fracture of unspecified part of neck of right femur, subsequent encounter for closed fracture with routine healing: Secondary | ICD-10-CM | POA: Diagnosis not present

## 2014-09-07 DIAGNOSIS — I4891 Unspecified atrial fibrillation: Secondary | ICD-10-CM | POA: Diagnosis not present

## 2014-09-07 DIAGNOSIS — E119 Type 2 diabetes mellitus without complications: Secondary | ICD-10-CM | POA: Diagnosis not present

## 2014-09-07 DIAGNOSIS — I5030 Unspecified diastolic (congestive) heart failure: Secondary | ICD-10-CM | POA: Diagnosis not present

## 2014-09-08 DIAGNOSIS — I129 Hypertensive chronic kidney disease with stage 1 through stage 4 chronic kidney disease, or unspecified chronic kidney disease: Secondary | ICD-10-CM | POA: Diagnosis not present

## 2014-09-08 DIAGNOSIS — I5030 Unspecified diastolic (congestive) heart failure: Secondary | ICD-10-CM | POA: Diagnosis not present

## 2014-09-08 DIAGNOSIS — S72001D Fracture of unspecified part of neck of right femur, subsequent encounter for closed fracture with routine healing: Secondary | ICD-10-CM | POA: Diagnosis not present

## 2014-09-08 DIAGNOSIS — I4891 Unspecified atrial fibrillation: Secondary | ICD-10-CM | POA: Diagnosis not present

## 2014-09-08 DIAGNOSIS — E119 Type 2 diabetes mellitus without complications: Secondary | ICD-10-CM | POA: Diagnosis not present

## 2014-09-08 DIAGNOSIS — N184 Chronic kidney disease, stage 4 (severe): Secondary | ICD-10-CM | POA: Diagnosis not present

## 2014-09-09 ENCOUNTER — Ambulatory Visit
Admission: RE | Admit: 2014-09-09 | Discharge: 2014-09-09 | Disposition: A | Discharge status: Home / Self Care | Payer: Medicare Other | Source: Ambulatory Visit | Attending: Urology | Admitting: Urology

## 2014-09-09 DIAGNOSIS — M85852 Other specified disorders of bone density and structure, left thigh: Secondary | ICD-10-CM | POA: Diagnosis not present

## 2014-09-09 DIAGNOSIS — Z79899 Other long term (current) drug therapy: Secondary | ICD-10-CM

## 2014-09-09 DIAGNOSIS — C61 Malignant neoplasm of prostate: Secondary | ICD-10-CM | POA: Diagnosis not present

## 2014-09-13 ENCOUNTER — Other Ambulatory Visit: Payer: Self-pay | Admitting: Orthopedic Surgery

## 2014-09-13 ENCOUNTER — Ambulatory Visit
Admission: RE | Admit: 2014-09-13 | Discharge: 2014-09-13 | Disposition: A | Discharge status: Home / Self Care | Payer: Medicare Other | Source: Ambulatory Visit | Attending: Orthopedic Surgery | Admitting: Orthopedic Surgery

## 2014-09-13 DIAGNOSIS — I2510 Atherosclerotic heart disease of native coronary artery without angina pectoris: Secondary | ICD-10-CM | POA: Diagnosis not present

## 2014-09-13 DIAGNOSIS — Z79899 Other long term (current) drug therapy: Secondary | ICD-10-CM | POA: Diagnosis not present

## 2014-09-13 DIAGNOSIS — E785 Hyperlipidemia, unspecified: Secondary | ICD-10-CM | POA: Diagnosis not present

## 2014-09-13 DIAGNOSIS — I5032 Chronic diastolic (congestive) heart failure: Secondary | ICD-10-CM | POA: Diagnosis not present

## 2014-09-13 DIAGNOSIS — N184 Chronic kidney disease, stage 4 (severe): Secondary | ICD-10-CM | POA: Diagnosis not present

## 2014-09-13 DIAGNOSIS — M4806 Spinal stenosis, lumbar region: Secondary | ICD-10-CM | POA: Diagnosis not present

## 2014-09-13 DIAGNOSIS — Z7901 Long term (current) use of anticoagulants: Secondary | ICD-10-CM | POA: Diagnosis not present

## 2014-09-13 DIAGNOSIS — E1122 Type 2 diabetes mellitus with diabetic chronic kidney disease: Secondary | ICD-10-CM | POA: Diagnosis not present

## 2014-09-13 DIAGNOSIS — Z950 Presence of cardiac pacemaker: Secondary | ICD-10-CM | POA: Diagnosis not present

## 2014-09-13 DIAGNOSIS — M5137 Other intervertebral disc degeneration, lumbosacral region: Secondary | ICD-10-CM | POA: Diagnosis not present

## 2014-09-13 DIAGNOSIS — T148XXA Other injury of unspecified body region, initial encounter: Secondary | ICD-10-CM

## 2014-09-13 DIAGNOSIS — Z951 Presence of aortocoronary bypass graft: Secondary | ICD-10-CM | POA: Diagnosis not present

## 2014-09-13 DIAGNOSIS — M4316 Spondylolisthesis, lumbar region: Secondary | ICD-10-CM | POA: Diagnosis not present

## 2014-09-13 DIAGNOSIS — M47817 Spondylosis without myelopathy or radiculopathy, lumbosacral region: Secondary | ICD-10-CM | POA: Diagnosis not present

## 2014-09-13 DIAGNOSIS — I119 Hypertensive heart disease without heart failure: Secondary | ICD-10-CM | POA: Diagnosis not present

## 2014-09-13 DIAGNOSIS — I480 Paroxysmal atrial fibrillation: Secondary | ICD-10-CM | POA: Diagnosis not present

## 2014-09-13 MED ORDER — IOHEXOL 180 MG/ML  SOLN
15.0000 mL | Freq: Once | INTRAMUSCULAR | Status: AC | PRN
  Administered 2014-09-13: 15 mL via INTRATHECAL

## 2014-09-13 NOTE — Progress Notes (Signed)
Pt has been off Warfarin and his INR is 1.1, done this AM.  Discharge instructions explained to pt.

## 2014-09-13 NOTE — Discharge Instructions (Signed)

## 2014-09-16 DIAGNOSIS — R3916 Straining to void: Secondary | ICD-10-CM | POA: Diagnosis not present

## 2014-09-16 DIAGNOSIS — N401 Enlarged prostate with lower urinary tract symptoms: Secondary | ICD-10-CM | POA: Diagnosis not present

## 2014-09-16 DIAGNOSIS — C61 Malignant neoplasm of prostate: Secondary | ICD-10-CM | POA: Diagnosis not present

## 2014-09-16 DIAGNOSIS — Z79899 Other long term (current) drug therapy: Secondary | ICD-10-CM | POA: Diagnosis not present

## 2014-09-16 DIAGNOSIS — M8580 Other specified disorders of bone density and structure, unspecified site: Secondary | ICD-10-CM | POA: Diagnosis not present

## 2014-09-20 DIAGNOSIS — M25551 Pain in right hip: Secondary | ICD-10-CM | POA: Diagnosis not present

## 2014-09-20 DIAGNOSIS — M5416 Radiculopathy, lumbar region: Secondary | ICD-10-CM | POA: Diagnosis not present

## 2014-09-20 DIAGNOSIS — M4316 Spondylolisthesis, lumbar region: Secondary | ICD-10-CM | POA: Diagnosis not present

## 2014-09-23 DIAGNOSIS — M5416 Radiculopathy, lumbar region: Secondary | ICD-10-CM | POA: Diagnosis not present

## 2014-09-23 DIAGNOSIS — M25551 Pain in right hip: Secondary | ICD-10-CM | POA: Diagnosis not present

## 2014-09-23 DIAGNOSIS — M4316 Spondylolisthesis, lumbar region: Secondary | ICD-10-CM | POA: Diagnosis not present

## 2014-09-27 DIAGNOSIS — I119 Hypertensive heart disease without heart failure: Secondary | ICD-10-CM | POA: Diagnosis not present

## 2014-09-27 DIAGNOSIS — E785 Hyperlipidemia, unspecified: Secondary | ICD-10-CM | POA: Diagnosis not present

## 2014-09-27 DIAGNOSIS — I2510 Atherosclerotic heart disease of native coronary artery without angina pectoris: Secondary | ICD-10-CM | POA: Diagnosis not present

## 2014-09-27 DIAGNOSIS — Z950 Presence of cardiac pacemaker: Secondary | ICD-10-CM | POA: Diagnosis not present

## 2014-09-27 DIAGNOSIS — Z7901 Long term (current) use of anticoagulants: Secondary | ICD-10-CM | POA: Diagnosis not present

## 2014-09-27 DIAGNOSIS — Z79899 Other long term (current) drug therapy: Secondary | ICD-10-CM | POA: Diagnosis not present

## 2014-09-27 DIAGNOSIS — E1122 Type 2 diabetes mellitus with diabetic chronic kidney disease: Secondary | ICD-10-CM | POA: Diagnosis not present

## 2014-09-27 DIAGNOSIS — I5032 Chronic diastolic (congestive) heart failure: Secondary | ICD-10-CM | POA: Diagnosis not present

## 2014-09-27 DIAGNOSIS — I480 Paroxysmal atrial fibrillation: Secondary | ICD-10-CM | POA: Diagnosis not present

## 2014-09-27 DIAGNOSIS — Z951 Presence of aortocoronary bypass graft: Secondary | ICD-10-CM | POA: Diagnosis not present

## 2014-09-27 DIAGNOSIS — N184 Chronic kidney disease, stage 4 (severe): Secondary | ICD-10-CM | POA: Diagnosis not present

## 2014-09-28 DIAGNOSIS — E1129 Type 2 diabetes mellitus with other diabetic kidney complication: Secondary | ICD-10-CM | POA: Diagnosis not present

## 2014-09-28 DIAGNOSIS — Z6830 Body mass index (BMI) 30.0-30.9, adult: Secondary | ICD-10-CM | POA: Diagnosis not present

## 2014-09-28 DIAGNOSIS — Z1389 Encounter for screening for other disorder: Secondary | ICD-10-CM | POA: Diagnosis not present

## 2014-09-28 DIAGNOSIS — N184 Chronic kidney disease, stage 4 (severe): Secondary | ICD-10-CM | POA: Diagnosis not present

## 2014-09-28 DIAGNOSIS — I10 Essential (primary) hypertension: Secondary | ICD-10-CM | POA: Diagnosis not present

## 2014-09-28 DIAGNOSIS — R062 Wheezing: Secondary | ICD-10-CM | POA: Diagnosis not present

## 2014-10-07 DIAGNOSIS — R062 Wheezing: Secondary | ICD-10-CM | POA: Diagnosis not present

## 2014-10-11 ENCOUNTER — Ambulatory Visit
Admission: RE | Admit: 2014-10-11 | Discharge: 2014-10-11 | Disposition: A | Discharge status: Home / Self Care | Payer: Medicare Other | Source: Ambulatory Visit | Attending: Cardiology | Admitting: Cardiology

## 2014-10-11 ENCOUNTER — Other Ambulatory Visit: Payer: Self-pay | Admitting: Cardiology

## 2014-10-11 DIAGNOSIS — E1122 Type 2 diabetes mellitus with diabetic chronic kidney disease: Secondary | ICD-10-CM | POA: Diagnosis not present

## 2014-10-11 DIAGNOSIS — I2510 Atherosclerotic heart disease of native coronary artery without angina pectoris: Secondary | ICD-10-CM | POA: Diagnosis not present

## 2014-10-11 DIAGNOSIS — M5416 Radiculopathy, lumbar region: Secondary | ICD-10-CM | POA: Diagnosis not present

## 2014-10-11 DIAGNOSIS — I5032 Chronic diastolic (congestive) heart failure: Secondary | ICD-10-CM | POA: Diagnosis not present

## 2014-10-11 DIAGNOSIS — I480 Paroxysmal atrial fibrillation: Secondary | ICD-10-CM | POA: Diagnosis not present

## 2014-10-11 DIAGNOSIS — R0602 Shortness of breath: Secondary | ICD-10-CM | POA: Diagnosis not present

## 2014-10-11 DIAGNOSIS — M25551 Pain in right hip: Secondary | ICD-10-CM | POA: Diagnosis not present

## 2014-10-11 DIAGNOSIS — N184 Chronic kidney disease, stage 4 (severe): Secondary | ICD-10-CM | POA: Diagnosis not present

## 2014-10-11 DIAGNOSIS — Z950 Presence of cardiac pacemaker: Secondary | ICD-10-CM | POA: Diagnosis not present

## 2014-10-11 DIAGNOSIS — M4316 Spondylolisthesis, lumbar region: Secondary | ICD-10-CM | POA: Diagnosis not present

## 2014-10-11 DIAGNOSIS — Z79899 Other long term (current) drug therapy: Secondary | ICD-10-CM | POA: Diagnosis not present

## 2014-10-11 DIAGNOSIS — E785 Hyperlipidemia, unspecified: Secondary | ICD-10-CM | POA: Diagnosis not present

## 2014-10-11 DIAGNOSIS — Z951 Presence of aortocoronary bypass graft: Secondary | ICD-10-CM | POA: Diagnosis not present

## 2014-10-11 DIAGNOSIS — Z7901 Long term (current) use of anticoagulants: Secondary | ICD-10-CM | POA: Diagnosis not present

## 2014-10-11 DIAGNOSIS — I119 Hypertensive heart disease without heart failure: Secondary | ICD-10-CM | POA: Diagnosis not present

## 2014-10-25 DIAGNOSIS — D631 Anemia in chronic kidney disease: Secondary | ICD-10-CM | POA: Diagnosis not present

## 2014-10-25 DIAGNOSIS — I129 Hypertensive chronic kidney disease with stage 1 through stage 4 chronic kidney disease, or unspecified chronic kidney disease: Secondary | ICD-10-CM | POA: Diagnosis not present

## 2014-10-25 DIAGNOSIS — N184 Chronic kidney disease, stage 4 (severe): Secondary | ICD-10-CM | POA: Diagnosis not present

## 2014-10-25 DIAGNOSIS — E1129 Type 2 diabetes mellitus with other diabetic kidney complication: Secondary | ICD-10-CM | POA: Diagnosis not present

## 2014-10-26 DIAGNOSIS — R0602 Shortness of breath: Secondary | ICD-10-CM | POA: Diagnosis not present

## 2014-10-26 DIAGNOSIS — I2510 Atherosclerotic heart disease of native coronary artery without angina pectoris: Secondary | ICD-10-CM | POA: Diagnosis not present

## 2014-10-26 DIAGNOSIS — I480 Paroxysmal atrial fibrillation: Secondary | ICD-10-CM | POA: Diagnosis not present

## 2014-10-26 DIAGNOSIS — E1122 Type 2 diabetes mellitus with diabetic chronic kidney disease: Secondary | ICD-10-CM | POA: Diagnosis not present

## 2014-10-26 DIAGNOSIS — I5032 Chronic diastolic (congestive) heart failure: Secondary | ICD-10-CM | POA: Diagnosis not present

## 2014-10-26 DIAGNOSIS — Z950 Presence of cardiac pacemaker: Secondary | ICD-10-CM | POA: Diagnosis not present

## 2014-10-26 DIAGNOSIS — N184 Chronic kidney disease, stage 4 (severe): Secondary | ICD-10-CM | POA: Diagnosis not present

## 2014-10-26 DIAGNOSIS — I119 Hypertensive heart disease without heart failure: Secondary | ICD-10-CM | POA: Diagnosis not present

## 2014-10-26 DIAGNOSIS — Z951 Presence of aortocoronary bypass graft: Secondary | ICD-10-CM | POA: Diagnosis not present

## 2014-10-26 DIAGNOSIS — Z79899 Other long term (current) drug therapy: Secondary | ICD-10-CM | POA: Diagnosis not present

## 2014-10-26 DIAGNOSIS — E785 Hyperlipidemia, unspecified: Secondary | ICD-10-CM | POA: Diagnosis not present

## 2014-10-26 DIAGNOSIS — Z7901 Long term (current) use of anticoagulants: Secondary | ICD-10-CM | POA: Diagnosis not present

## 2014-11-18 DIAGNOSIS — Z23 Encounter for immunization: Secondary | ICD-10-CM | POA: Diagnosis not present

## 2014-11-18 DIAGNOSIS — Z6830 Body mass index (BMI) 30.0-30.9, adult: Secondary | ICD-10-CM | POA: Diagnosis not present

## 2014-11-18 DIAGNOSIS — N184 Chronic kidney disease, stage 4 (severe): Secondary | ICD-10-CM | POA: Diagnosis not present

## 2014-11-18 DIAGNOSIS — R062 Wheezing: Secondary | ICD-10-CM | POA: Diagnosis not present

## 2014-11-18 DIAGNOSIS — I509 Heart failure, unspecified: Secondary | ICD-10-CM | POA: Diagnosis not present

## 2014-11-23 DIAGNOSIS — Z951 Presence of aortocoronary bypass graft: Secondary | ICD-10-CM | POA: Diagnosis not present

## 2014-11-23 DIAGNOSIS — Z7901 Long term (current) use of anticoagulants: Secondary | ICD-10-CM | POA: Diagnosis not present

## 2014-11-23 DIAGNOSIS — I119 Hypertensive heart disease without heart failure: Secondary | ICD-10-CM | POA: Diagnosis not present

## 2014-11-23 DIAGNOSIS — I480 Paroxysmal atrial fibrillation: Secondary | ICD-10-CM | POA: Diagnosis not present

## 2014-11-23 DIAGNOSIS — Z79899 Other long term (current) drug therapy: Secondary | ICD-10-CM | POA: Diagnosis not present

## 2014-11-23 DIAGNOSIS — N184 Chronic kidney disease, stage 4 (severe): Secondary | ICD-10-CM | POA: Diagnosis not present

## 2014-11-23 DIAGNOSIS — I2510 Atherosclerotic heart disease of native coronary artery without angina pectoris: Secondary | ICD-10-CM | POA: Diagnosis not present

## 2014-11-23 DIAGNOSIS — R0602 Shortness of breath: Secondary | ICD-10-CM | POA: Diagnosis not present

## 2014-11-23 DIAGNOSIS — Z950 Presence of cardiac pacemaker: Secondary | ICD-10-CM | POA: Diagnosis not present

## 2014-11-23 DIAGNOSIS — E1122 Type 2 diabetes mellitus with diabetic chronic kidney disease: Secondary | ICD-10-CM | POA: Diagnosis not present

## 2014-11-23 DIAGNOSIS — I5032 Chronic diastolic (congestive) heart failure: Secondary | ICD-10-CM | POA: Diagnosis not present

## 2014-11-23 DIAGNOSIS — E785 Hyperlipidemia, unspecified: Secondary | ICD-10-CM | POA: Diagnosis not present

## 2014-11-28 ENCOUNTER — Telehealth: Payer: Self-pay | Admitting: Pulmonary Disease

## 2014-11-28 NOTE — Telephone Encounter (Signed)
Dr. Danna Hefty nurse, Amy called, she said that Dr. Dagmar Hait advised her that he had spoken with RA about this patient and RA agreed to work patient in on our schedule for a consult. Amy states that patient is wheezing.  She said that patient has been scheduled for CT Neck per RA's recommendation to Dr. Dagmar Hait.    RA - please advise if I need to double book this patient onto your schedule somewhere?

## 2014-11-28 NOTE — Telephone Encounter (Signed)
LMTC x 1 for Amy with Dr Dagmar Hait

## 2014-11-28 NOTE — Telephone Encounter (Signed)
Christopher [name] cb, [phone], please have them page for Amy do not go to vm

## 2014-11-29 NOTE — Telephone Encounter (Signed)
Ok to work in.

## 2014-11-29 NOTE — Telephone Encounter (Signed)
Christopher [name], please advise where pt can be worked in at? thanks

## 2014-11-30 ENCOUNTER — Other Ambulatory Visit: Payer: Self-pay | Admitting: Internal Medicine

## 2014-11-30 DIAGNOSIS — R062 Wheezing: Secondary | ICD-10-CM

## 2014-11-30 NOTE — Telephone Encounter (Signed)
Patient scheduled to see RA on Thursday 9/22. Patient notified. Nothing further needed.

## 2014-12-01 ENCOUNTER — Ambulatory Visit
Admission: RE | Admit: 2014-12-01 | Discharge: 2014-12-01 | Disposition: A | Discharge status: Home / Self Care | Payer: Medicare Other | Source: Ambulatory Visit | Attending: Internal Medicine | Admitting: Internal Medicine

## 2014-12-01 ENCOUNTER — Encounter: Payer: Self-pay | Admitting: Pulmonary Disease

## 2014-12-01 ENCOUNTER — Ambulatory Visit (INDEPENDENT_AMBULATORY_CARE_PROVIDER_SITE_OTHER): Payer: Medicare Other | Admitting: Pulmonary Disease

## 2014-12-01 VITALS — BP 140/76 | HR 65 | Temp 97.9°F | Ht 74.0 in | Wt 242.0 lb

## 2014-12-01 DIAGNOSIS — J849 Interstitial pulmonary disease, unspecified: Secondary | ICD-10-CM

## 2014-12-01 DIAGNOSIS — R062 Wheezing: Secondary | ICD-10-CM

## 2014-12-01 DIAGNOSIS — R0602 Shortness of breath: Secondary | ICD-10-CM

## 2014-12-01 MED ORDER — ALBUTEROL SULFATE 108 (90 BASE) MCG/ACT IN AEPB: 2.0000 | INHALATION_SPRAY | Freq: Every day | RESPIRATORY_TRACT | Status: DC

## 2014-12-01 NOTE — Progress Notes (Signed)
Subjective:    Patient ID: Christopher [name], male    DOB: 12-22-1926, 81 y.o.   MRN: [id number]  HPI  PCP [facility]  Chief Complaint  Patient presents with  . Sleep Consult    Referred by Dr. Dagmar Hait - wheezing and difficultly breathing at night.  Wakes up tired and daytime sleepiness.  Epworth Score 54.   81 year old remote smoker referred for evaluation of dyspnea and upper airway wheezing. He had Episode of acute dizziness with fall resulting in trauma to the right side of the head- head CT neg. he unfortunately fractured his hip and required surgery. He is not fully recovered from this and has persistent pain issues and needs a walker. Since then, he reports wheezing sounds coming from his neck when he lies down to sleep. This is audible to his wife during his sleep but also occurs when he is awake and lying in bed. It seems to be worse on his right side but has now also started occurring on his left side. He showed me an RDI recording was quite remarkable upper airway wheezing.  I could not reproduce the symptom on making him lie supine today or on his side-but there was mild pseudo wheeze on panting maneuver He also reports dyspnea on exertion, especially when he bends down, it takes him a few seconds to recover. He has chronic atrial fibrillation, has failed cardioversion, has a pacemaker and is maintained on amiodarone for many years and Coumadin Wynonia Lawman). 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 He underwent CABG in 2003 and back surgery in 2001. He denies any episode of prolonged mechanical ventilation. Epworth sleepiness score is 6. He denies non-refreshing sleep. Bedtime is around 11 PM, wakes up around 8 AM, no snoring has been noted by his wife or witnessed apneas.  Chest x-ray 10/31/14 shows pacemaker, no infiltrates or effusions Echo 05/4191-XTKW LVH, diastolic dysfunction, PASP at least 49 mm  Past Medical  History  Diagnosis Date  . Lumbar disc disease   . BPH (benign prostatic hyperplasia)   . HTN (hypertension)   . Diabetes mellitus type II   . Sinus node dysfunction     symptomatic bradycardia  . Pacemaker     Medtronic-dual-chamber-DOI 2012  . CAD (coronary artery disease)     followed by Dr. Wynonia Lawman  . Arthritis   . History of skin cancer   . Frequency of urination   . Nocturia   . Chronic kidney disease     kidneys function at "20 %" followed by Dr. Mercy Moore  . Cancer     history of skin cancer  . Dysrhythmia     a fib  . Prostate cancer   . Ruptured disk 1992  . Skin cancer     Past Surgical History  Procedure Laterality Date  . Coronary artery bypass graft  2003    x3  . Cardiac catheterization    . Lumbar laminectomy  11/01  . Cardioversion  08/08/2011    Procedure: CARDIOVERSION;  Surgeon: Jacolyn Reedy, MD;  Location: [facility];  Service: Cardiovascular;  Laterality: N/A;  . Cardioversion N/A 07/16/2012    Procedure: CARDIOVERSION;  Surgeon: Jacolyn Reedy, MD;  Location: [facility];  Service: Cardiovascular;  Laterality: N/A;  . Pacemaker insertion  2012  . Cataracts removed    . Cystoscopy with biopsy N/A 06/21/2013    Procedure: CYSTOSCOPY WITH BIOPSY;  Surgeon: Molli Hazard, MD;  Location: [facility];  Service: Urology;  Laterality: N/A;    TUR RESECTION OF POLYP BILATERAL RETROGRADE PYELOGRAM BLADDER BIOPSY    . Prostate biopsy N/A 06/21/2013    Procedure: BIOPSY TRANSRECTAL ULTRASONIC PROSTATE (TUBP);  Surgeon: Molli Hazard, MD;  Location: [facility];  Service: Urology;  Laterality: N/A;  PROSTATE NERVE BLOCK  . Appendectomy  1980  . Hip pinning,cannulated Right 06/30/2014    Procedure: CANNULATED HIP PINNING;  Surgeon: Renette Butters, MD;  Location: [facility];  Service: Orthopedics;  Laterality: Right;   Allergies  Allergen Reactions  . Crestor [Rosuvastatin] Other (See Comments)    Aches in joint   . Lipitor [Atorvastatin] Other (See  Comments)    Joint aching  . Penicillins Rash  . Tramadol Nausea Only  . Vicodin [Hydrocodone-Acetaminophen] Anxiety    "Made me nervous"    Social History   Social History  . Marital Status: Married    Spouse Name: N/A  . Number of Children: 3  . Years of Education: N/A   Occupational History  . retired    Social History Main Topics  . Smoking status: Former Smoker -- 0.75 packs/day for 24 years    Types: Cigarettes    Quit date: 03/12/1968  . Smokeless tobacco: Former Systems developer    Types: Snuff, Chew    Quit date: 03/11/1968  . Alcohol Use: No  . Drug Use: No  . Sexual Activity: Not Currently   Other Topics Concern  . Not on file   Social History Narrative    Family History  Problem Relation Age of Onset  . Cancer Mother     breast  . Cancer Father     prostate, esophagus     Review of Systems  Constitutional: Negative for fever and unexpected weight change.  HENT: Positive for congestion and postnasal drip. Negative for dental problem, ear pain, nosebleeds, rhinorrhea, sinus pressure, sneezing, sore throat and trouble swallowing.   Eyes: Negative for redness and itching.  Respiratory: Positive for cough, shortness of breath and wheezing. Negative for chest tightness.   Cardiovascular: Positive for leg swelling. Negative for palpitations.  Gastrointestinal: Negative for nausea and vomiting.  Genitourinary: Negative for dysuria.  Musculoskeletal: Negative for joint swelling.  Skin: Negative for rash.  Neurological: Negative for headaches.  Hematological: Bruises/bleeds easily.  Psychiatric/Behavioral: Negative for dysphoric mood. The patient is not nervous/anxious.        Objective:   Physical Exam  Gen. Pleasant, obese, in no distress, normal affect ENT - no lesions, no post nasal drip, class 2-3 airway Neck: No JVD, no thyromegaly, no carotid bruits Lungs: no use of accessory muscles, no dullness to percussion, bibasal rales , no rhonchi    Cardiovascular: Rhythm regular, heart sounds  normal, no murmurs or gallops, no peripheral edema Abdomen: soft and non-tender, no hepatosplenomegaly, BS normal. Musculoskeletal: No deformities, no cyanosis or clubbing Neuro:  alert, non focal, no tremors        Assessment & Plan:

## 2014-12-01 NOTE — Patient Instructions (Signed)
CT of neck has been scheduled for today Add High res CT chest Schedule PFTs & FU in 2 weeks Trial of pro-air (sample) 2 puffs at bedtime

## 2014-12-01 NOTE — Assessment & Plan Note (Signed)
I could not reproduce the symptom on making him lie supine today or on his side-but there was mild pseudo wheeze on panting maneuver.  He does not have a history of prolonged mechanical ventilation -so I would doubt tracheal stenosis. Per the first step here would be to image the neck with CT  Trial of albuterol inhaler for rescue when he is symptomatic

## 2014-12-01 NOTE — Assessment & Plan Note (Addendum)
Cause of dyspnea is unclear-presence of bibasal crackles raises the question of interstitial lung disease-could be related to amiodarone in this setting or something like IPF. I doubt that he would have significant lower airway disease  CT of neck has been scheduled for today Add High res CT chest Schedule PFTs & FU in 2 weeks

## 2014-12-21 DIAGNOSIS — E1122 Type 2 diabetes mellitus with diabetic chronic kidney disease: Secondary | ICD-10-CM | POA: Diagnosis not present

## 2014-12-21 DIAGNOSIS — I2510 Atherosclerotic heart disease of native coronary artery without angina pectoris: Secondary | ICD-10-CM | POA: Diagnosis not present

## 2014-12-21 DIAGNOSIS — Z79899 Other long term (current) drug therapy: Secondary | ICD-10-CM | POA: Diagnosis not present

## 2014-12-21 DIAGNOSIS — Z950 Presence of cardiac pacemaker: Secondary | ICD-10-CM | POA: Diagnosis not present

## 2014-12-21 DIAGNOSIS — E785 Hyperlipidemia, unspecified: Secondary | ICD-10-CM | POA: Diagnosis not present

## 2014-12-21 DIAGNOSIS — I119 Hypertensive heart disease without heart failure: Secondary | ICD-10-CM | POA: Diagnosis not present

## 2014-12-21 DIAGNOSIS — N184 Chronic kidney disease, stage 4 (severe): Secondary | ICD-10-CM | POA: Diagnosis not present

## 2014-12-21 DIAGNOSIS — I5032 Chronic diastolic (congestive) heart failure: Secondary | ICD-10-CM | POA: Diagnosis not present

## 2014-12-21 DIAGNOSIS — R0602 Shortness of breath: Secondary | ICD-10-CM | POA: Diagnosis not present

## 2014-12-21 DIAGNOSIS — Z951 Presence of aortocoronary bypass graft: Secondary | ICD-10-CM | POA: Diagnosis not present

## 2014-12-21 DIAGNOSIS — I480 Paroxysmal atrial fibrillation: Secondary | ICD-10-CM | POA: Diagnosis not present

## 2014-12-21 DIAGNOSIS — Z7901 Long term (current) use of anticoagulants: Secondary | ICD-10-CM | POA: Diagnosis not present

## 2014-12-22 ENCOUNTER — Ambulatory Visit (HOSPITAL_COMMUNITY)
Admission: RE | Admit: 2014-12-22 | Discharge: 2014-12-22 | Disposition: A | Discharge status: Home / Self Care | Payer: Medicare Other | Source: Ambulatory Visit | Attending: Pulmonary Disease | Admitting: Pulmonary Disease

## 2014-12-22 DIAGNOSIS — R0602 Shortness of breath: Secondary | ICD-10-CM | POA: Diagnosis not present

## 2014-12-22 LAB — PULMONARY FUNCTION TEST
DL/VA % PRED: 61 %
DL/VA: 2.92 ml/min/mmHg/L
DLCO unc % pred: 33 %
DLCO unc: 12.74 ml/min/mmHg
FEF 25-75 POST: 2.63 L/s
FEF 25-75 Pre: 2.26 L/sec
FEF2575-%Change-Post: 16 %
FEF2575-%Pred-Post: 127 %
FEF2575-%Pred-Pre: 109 %
FEV1-%CHANGE-POST: 1 %
FEV1-%PRED-PRE: 66 %
FEV1-%Pred-Post: 67 %
FEV1-POST: 2.12 L
FEV1-PRE: 2.09 L
FEV1FVC-%Change-Post: 2 %
FEV1FVC-%Pred-Pre: 116 %
FEV6-%Change-Post: 0 %
FEV6-%PRED-POST: 60 %
FEV6-%PRED-PRE: 60 %
FEV6-PRE: 2.53 L
FEV6-Post: 2.52 L
FEV6FVC-%CHANGE-POST: 0 %
FEV6FVC-%PRED-PRE: 106 %
FEV6FVC-%Pred-Post: 106 %
FVC-%Change-Post: -1 %
FVC-%PRED-PRE: 57 %
FVC-%Pred-Post: 56 %
FVC-POST: 2.53 L
FVC-Pre: 2.56 L
POST FEV6/FVC RATIO: 99 %
PRE FEV6/FVC RATIO: 99 %
Post FEV1/FVC ratio: 84 %
Pre FEV1/FVC ratio: 82 %
RV % PRED: 76 %
RV: 2.28 L
TLC % PRED: 61 %
TLC: 4.87 L

## 2014-12-22 MED ORDER — ALBUTEROL SULFATE (2.5 MG/3ML) 0.083% IN NEBU
2.5000 mg | INHALATION_SOLUTION | Freq: Once | RESPIRATORY_TRACT | Status: AC
  Administered 2014-12-22: 2.5 mg via RESPIRATORY_TRACT

## 2014-12-26 ENCOUNTER — Ambulatory Visit (INDEPENDENT_AMBULATORY_CARE_PROVIDER_SITE_OTHER): Payer: Medicare Other | Admitting: Pulmonary Disease

## 2014-12-26 ENCOUNTER — Encounter: Payer: Self-pay | Admitting: Pulmonary Disease

## 2014-12-26 VITALS — BP 132/82 | HR 69 | Ht 74.0 in | Wt 240.2 lb

## 2014-12-26 DIAGNOSIS — R911 Solitary pulmonary nodule: Secondary | ICD-10-CM | POA: Diagnosis not present

## 2014-12-26 DIAGNOSIS — J984 Other disorders of lung: Secondary | ICD-10-CM

## 2014-12-26 DIAGNOSIS — R062 Wheezing: Secondary | ICD-10-CM | POA: Diagnosis not present

## 2014-12-26 DIAGNOSIS — E1129 Type 2 diabetes mellitus with other diabetic kidney complication: Secondary | ICD-10-CM | POA: Diagnosis not present

## 2014-12-26 DIAGNOSIS — N184 Chronic kidney disease, stage 4 (severe): Secondary | ICD-10-CM | POA: Diagnosis not present

## 2014-12-26 DIAGNOSIS — I129 Hypertensive chronic kidney disease with stage 1 through stage 4 chronic kidney disease, or unspecified chronic kidney disease: Secondary | ICD-10-CM | POA: Diagnosis not present

## 2014-12-26 DIAGNOSIS — N2581 Secondary hyperparathyroidism of renal origin: Secondary | ICD-10-CM | POA: Diagnosis not present

## 2014-12-26 DIAGNOSIS — D631 Anemia in chronic kidney disease: Secondary | ICD-10-CM | POA: Diagnosis not present

## 2014-12-26 MED ORDER — FLUTICASONE PROPIONATE 50 MCG/ACT NA SUSP: 1.0000 | Freq: Every day | NASAL | Status: DC

## 2014-12-26 NOTE — Assessment & Plan Note (Signed)
?   Amiodarone lung Your lung function is decreased- shows restriction - this may be due to amiodarone Please discuss with Dr Wynonia Lawman if there are alternatives to this drug Repeat CT scan in 6 months

## 2014-12-26 NOTE — Assessment & Plan Note (Signed)
Rx for Flonase 1 spray each nare at bedtime OK to dc albuterol

## 2014-12-26 NOTE — Addendum Note (Signed)
Addended by: Jerrol Banana on: 12/26/2014 10:21 AM   Modules accepted: Orders

## 2014-12-26 NOTE — Patient Instructions (Addendum)
Rx for Flonase 1 spray each nare at bedtime Your lung function is decreased- shows restriction - this may be due to amiodarone Please discuss with Dr Wynonia Lawman if there are alternatives to this drug Repeat CT scan in 6 months

## 2014-12-26 NOTE — Progress Notes (Signed)
   Subjective:    Patient ID: Christopher [name], male    DOB: 1927/01/18, 81 y.o.   MRN: [id number]  HPI  81 year old remote smoker for FU of dyspnea and upper airway wheezing. He had Episode of acute dizziness with fall resulting in trauma to the right side of the head- head CT neg. he unfortunately fractured his hip and required surgery. He is not fully recovered from this and has persistent pain issues and needs a walker. Since then, he reports wheezing sounds coming from his neck when he lies down to sleep. This is audible to his wife during his sleep but also occurs when he is awake and lying in bed. It seems to be worse on his right side but has now also started occurring on his left side. He showed me an RDI recording was quite remarkable upper airway wheezing.  I could not reproduce the symptom on making him lie supine  or on his side-but there was mild pseudo wheeze on panting maneuver He also reports dyspnea on exertion, especially when he bends down, it takes him a few seconds to recover. He has chronic atrial fibrillation, has failed cardioversion, has a pacemaker and is maintained on amiodarone for many years and Coumadin Wynonia Lawman). 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 He underwent CABG in 2003 and back surgery in 2001. He denies any episode of prolonged mechanical ventilation.    12/26/2014  Chief Complaint  Patient presents with  . Follow-up    SOB. Pt states that his SOB has not improved. Pt states that he is dyspneic with even the slightest exertion. Pt states that he has been using albuterol HFA qhs but it is not helping.   Albuterol did not relieve, flonase provided dramatic relief Reviewed CT imaging Has been on amio x many years Hip may be getting worse  Significant tests/ events   Chest x-ray 10/31/14 shows pacemaker, no infiltrates or effusions Echo 03/4429-[facility] LVH, diastolic dysfunction,  PASP at least 49 mm CT neck 11/2014 neg CT chest 11/2014 >> mild emphysema ,chronic appearing mild bronchiectasis at bases PFTs 12/2014 >>mod-severe restriction , DLCO 33%  Review of Systems neg for any significant sore throat, dysphagia, itching, sneezing, nasal congestion or excess/ purulent secretions, fever, chills, sweats, unintended wt loss, pleuritic or exertional cp, hempoptysis, orthopnea pnd or change in chronic leg swelling. Also denies presyncope, palpitations, heartburn, abdominal pain, nausea, vomiting, diarrhea or change in bowel or urinary habits, dysuria,hematuria, rash, arthralgias, visual complaints, headache, numbness weakness or ataxia.     Objective:   Physical Exam  Gen. Pleasant, well-nourished, in no distress ENT - no lesions, no post nasal drip Neck: No JVD, no thyromegaly, no carotid bruits Lungs: no use of accessory muscles, no dullness to percussion,bibasal  Rales, no rhonchi  Cardiovascular: Rhythm regular, heart sounds  normal, no murmurs or gallops, no peripheral edema Musculoskeletal: No deformities, no cyanosis or clubbing        Assessment & Plan:

## 2014-12-27 DIAGNOSIS — I509 Heart failure, unspecified: Secondary | ICD-10-CM | POA: Diagnosis not present

## 2014-12-27 DIAGNOSIS — E1129 Type 2 diabetes mellitus with other diabetic kidney complication: Secondary | ICD-10-CM | POA: Diagnosis not present

## 2014-12-27 DIAGNOSIS — I10 Essential (primary) hypertension: Secondary | ICD-10-CM | POA: Diagnosis not present

## 2014-12-27 DIAGNOSIS — R062 Wheezing: Secondary | ICD-10-CM | POA: Diagnosis not present

## 2014-12-27 DIAGNOSIS — E119 Type 2 diabetes mellitus without complications: Secondary | ICD-10-CM | POA: Diagnosis not present

## 2014-12-27 DIAGNOSIS — I480 Paroxysmal atrial fibrillation: Secondary | ICD-10-CM | POA: Diagnosis not present

## 2014-12-27 DIAGNOSIS — N184 Chronic kidney disease, stage 4 (severe): Secondary | ICD-10-CM | POA: Diagnosis not present

## 2014-12-27 DIAGNOSIS — E1151 Type 2 diabetes mellitus with diabetic peripheral angiopathy without gangrene: Secondary | ICD-10-CM | POA: Diagnosis not present

## 2014-12-27 DIAGNOSIS — Z6832 Body mass index (BMI) 32.0-32.9, adult: Secondary | ICD-10-CM | POA: Diagnosis not present

## 2015-01-06 DIAGNOSIS — E119 Type 2 diabetes mellitus without complications: Secondary | ICD-10-CM | POA: Diagnosis not present

## 2015-01-06 DIAGNOSIS — I129 Hypertensive chronic kidney disease with stage 1 through stage 4 chronic kidney disease, or unspecified chronic kidney disease: Secondary | ICD-10-CM | POA: Diagnosis not present

## 2015-01-06 DIAGNOSIS — I5030 Unspecified diastolic (congestive) heart failure: Secondary | ICD-10-CM

## 2015-01-06 DIAGNOSIS — I4891 Unspecified atrial fibrillation: Secondary | ICD-10-CM | POA: Diagnosis not present

## 2015-01-06 DIAGNOSIS — S72001D Fracture of unspecified part of neck of right femur, subsequent encounter for closed fracture with routine healing: Secondary | ICD-10-CM | POA: Diagnosis not present

## 2015-01-06 DIAGNOSIS — N184 Chronic kidney disease, stage 4 (severe): Secondary | ICD-10-CM

## 2015-01-09 DIAGNOSIS — I480 Paroxysmal atrial fibrillation: Secondary | ICD-10-CM | POA: Diagnosis not present

## 2015-01-09 DIAGNOSIS — E1122 Type 2 diabetes mellitus with diabetic chronic kidney disease: Secondary | ICD-10-CM | POA: Diagnosis not present

## 2015-01-09 DIAGNOSIS — Z7901 Long term (current) use of anticoagulants: Secondary | ICD-10-CM | POA: Diagnosis not present

## 2015-01-09 DIAGNOSIS — Z950 Presence of cardiac pacemaker: Secondary | ICD-10-CM | POA: Diagnosis not present

## 2015-01-09 DIAGNOSIS — Z951 Presence of aortocoronary bypass graft: Secondary | ICD-10-CM | POA: Diagnosis not present

## 2015-01-09 DIAGNOSIS — I119 Hypertensive heart disease without heart failure: Secondary | ICD-10-CM | POA: Diagnosis not present

## 2015-01-09 DIAGNOSIS — E785 Hyperlipidemia, unspecified: Secondary | ICD-10-CM | POA: Diagnosis not present

## 2015-01-09 DIAGNOSIS — I5032 Chronic diastolic (congestive) heart failure: Secondary | ICD-10-CM | POA: Diagnosis not present

## 2015-01-09 DIAGNOSIS — Z79899 Other long term (current) drug therapy: Secondary | ICD-10-CM | POA: Diagnosis not present

## 2015-01-09 DIAGNOSIS — I2510 Atherosclerotic heart disease of native coronary artery without angina pectoris: Secondary | ICD-10-CM | POA: Diagnosis not present

## 2015-01-09 DIAGNOSIS — N184 Chronic kidney disease, stage 4 (severe): Secondary | ICD-10-CM | POA: Diagnosis not present

## 2015-01-09 DIAGNOSIS — R0602 Shortness of breath: Secondary | ICD-10-CM | POA: Diagnosis not present

## 2015-01-13 DIAGNOSIS — M8580 Other specified disorders of bone density and structure, unspecified site: Secondary | ICD-10-CM | POA: Diagnosis not present

## 2015-01-13 DIAGNOSIS — C61 Malignant neoplasm of prostate: Secondary | ICD-10-CM | POA: Diagnosis not present

## 2015-01-16 DIAGNOSIS — Z951 Presence of aortocoronary bypass graft: Secondary | ICD-10-CM | POA: Diagnosis not present

## 2015-01-16 DIAGNOSIS — I5032 Chronic diastolic (congestive) heart failure: Secondary | ICD-10-CM | POA: Diagnosis not present

## 2015-01-16 DIAGNOSIS — R0602 Shortness of breath: Secondary | ICD-10-CM | POA: Diagnosis not present

## 2015-01-16 DIAGNOSIS — Z79899 Other long term (current) drug therapy: Secondary | ICD-10-CM | POA: Diagnosis not present

## 2015-01-16 DIAGNOSIS — I119 Hypertensive heart disease without heart failure: Secondary | ICD-10-CM | POA: Diagnosis not present

## 2015-01-16 DIAGNOSIS — E1122 Type 2 diabetes mellitus with diabetic chronic kidney disease: Secondary | ICD-10-CM | POA: Diagnosis not present

## 2015-01-16 DIAGNOSIS — N184 Chronic kidney disease, stage 4 (severe): Secondary | ICD-10-CM | POA: Diagnosis not present

## 2015-01-16 DIAGNOSIS — Z7901 Long term (current) use of anticoagulants: Secondary | ICD-10-CM | POA: Diagnosis not present

## 2015-01-16 DIAGNOSIS — I2510 Atherosclerotic heart disease of native coronary artery without angina pectoris: Secondary | ICD-10-CM | POA: Diagnosis not present

## 2015-01-16 DIAGNOSIS — R0609 Other forms of dyspnea: Secondary | ICD-10-CM | POA: Diagnosis not present

## 2015-01-16 DIAGNOSIS — E785 Hyperlipidemia, unspecified: Secondary | ICD-10-CM | POA: Diagnosis not present

## 2015-01-16 DIAGNOSIS — Z950 Presence of cardiac pacemaker: Secondary | ICD-10-CM | POA: Diagnosis not present

## 2015-01-16 DIAGNOSIS — I480 Paroxysmal atrial fibrillation: Secondary | ICD-10-CM | POA: Diagnosis not present

## 2015-01-20 DIAGNOSIS — N401 Enlarged prostate with lower urinary tract symptoms: Secondary | ICD-10-CM | POA: Diagnosis not present

## 2015-01-20 DIAGNOSIS — M8580 Other specified disorders of bone density and structure, unspecified site: Secondary | ICD-10-CM | POA: Diagnosis not present

## 2015-01-20 DIAGNOSIS — R3916 Straining to void: Secondary | ICD-10-CM | POA: Diagnosis not present

## 2015-01-20 DIAGNOSIS — C61 Malignant neoplasm of prostate: Secondary | ICD-10-CM | POA: Diagnosis not present

## 2015-01-30 DIAGNOSIS — I5032 Chronic diastolic (congestive) heart failure: Secondary | ICD-10-CM | POA: Diagnosis not present

## 2015-01-30 DIAGNOSIS — I480 Paroxysmal atrial fibrillation: Secondary | ICD-10-CM | POA: Diagnosis not present

## 2015-01-30 DIAGNOSIS — R0602 Shortness of breath: Secondary | ICD-10-CM | POA: Diagnosis not present

## 2015-01-30 DIAGNOSIS — Z79899 Other long term (current) drug therapy: Secondary | ICD-10-CM | POA: Diagnosis not present

## 2015-01-30 DIAGNOSIS — Z7901 Long term (current) use of anticoagulants: Secondary | ICD-10-CM | POA: Diagnosis not present

## 2015-01-30 DIAGNOSIS — I2510 Atherosclerotic heart disease of native coronary artery without angina pectoris: Secondary | ICD-10-CM | POA: Diagnosis not present

## 2015-01-30 DIAGNOSIS — Z951 Presence of aortocoronary bypass graft: Secondary | ICD-10-CM | POA: Diagnosis not present

## 2015-01-30 DIAGNOSIS — E1122 Type 2 diabetes mellitus with diabetic chronic kidney disease: Secondary | ICD-10-CM | POA: Diagnosis not present

## 2015-01-30 DIAGNOSIS — N184 Chronic kidney disease, stage 4 (severe): Secondary | ICD-10-CM | POA: Diagnosis not present

## 2015-01-30 DIAGNOSIS — Z950 Presence of cardiac pacemaker: Secondary | ICD-10-CM | POA: Diagnosis not present

## 2015-01-30 DIAGNOSIS — I119 Hypertensive heart disease without heart failure: Secondary | ICD-10-CM | POA: Diagnosis not present

## 2015-01-30 DIAGNOSIS — E785 Hyperlipidemia, unspecified: Secondary | ICD-10-CM | POA: Diagnosis not present

## 2015-02-10 DIAGNOSIS — I2510 Atherosclerotic heart disease of native coronary artery without angina pectoris: Secondary | ICD-10-CM | POA: Diagnosis not present

## 2015-02-10 DIAGNOSIS — E784 Other hyperlipidemia: Secondary | ICD-10-CM | POA: Diagnosis not present

## 2015-02-10 DIAGNOSIS — S72001A Fracture of unspecified part of neck of right femur, initial encounter for closed fracture: Secondary | ICD-10-CM | POA: Diagnosis not present

## 2015-02-10 DIAGNOSIS — Z125 Encounter for screening for malignant neoplasm of prostate: Secondary | ICD-10-CM | POA: Diagnosis not present

## 2015-02-10 DIAGNOSIS — N184 Chronic kidney disease, stage 4 (severe): Secondary | ICD-10-CM | POA: Diagnosis not present

## 2015-02-10 DIAGNOSIS — E1129 Type 2 diabetes mellitus with other diabetic kidney complication: Secondary | ICD-10-CM | POA: Diagnosis not present

## 2015-02-13 ENCOUNTER — Ambulatory Visit (INDEPENDENT_AMBULATORY_CARE_PROVIDER_SITE_OTHER): Payer: Medicare Other | Admitting: Family Medicine

## 2015-02-13 VITALS — BP 110/62 | HR 65 | Temp 97.7°F | Resp 16 | Ht 74.0 in | Wt 257.0 lb

## 2015-02-13 DIAGNOSIS — M79641 Pain in right hand: Secondary | ICD-10-CM | POA: Diagnosis not present

## 2015-02-13 DIAGNOSIS — S61401A Unspecified open wound of right hand, initial encounter: Secondary | ICD-10-CM

## 2015-02-13 NOTE — Patient Instructions (Signed)
Keep gentle pressure using the Coban bandage on the wound for the next 3 days. Please come back after 3:00 on Thursday for wound recheck

## 2015-02-13 NOTE — Progress Notes (Signed)
@UMFCLOGO @  By signing my name below, I, [name], attest that this documentation has been prepared under the direction and in the presence of Robyn Haber, MD.  Electronically Signed: Thea Alken, ED Scribe. 02/13/2015. 11:52 AM.  Patient ID: Christopher [name] MRN: [id number], DOB: January 01, 1927, 81 y.o. Date of Encounter: 02/13/2015, 11:39 AM  Primary Physician: Tivis Ringer, MD  Chief Complaint:  Chief Complaint  Patient presents with  . laceration on hand    right hand, pt on blood thinners    HPI: 80 y.o. year old male with history below presents with laceration to dorsal right hand, obtained less than 1 hour ago. Pt lacerated his hand on his bathroom door. He did not wash wound but was able to bandage it. Bleeding is controlled. He is UTD on tetanus. Pt is on Coumadin   Past Medical History  Diagnosis Date  . Lumbar disc disease   . BPH (benign prostatic hyperplasia)   . HTN (hypertension)   . Diabetes mellitus type II   . Sinus node dysfunction (HCC)     symptomatic bradycardia  . Pacemaker     Medtronic-dual-chamber-DOI 2012  . CAD (coronary artery disease)     followed by Dr. Wynonia Lawman  . Arthritis   . History of skin cancer   . Frequency of urination   . Nocturia   . Chronic kidney disease     kidneys function at "20 %" followed by Dr. Mercy Moore  . Cancer [facility])     history of skin cancer  . Dysrhythmia     a fib  . Prostate cancer ([facility])   . Ruptured disk 1992  . Skin cancer      Home Meds: Prior to Admission medications   Medication Sig Start Date End Date Taking? Authorizing Provider  amiodarone (PACERONE) 200 MG tablet Take 200 mg by mouth every morning.   Yes Historical Provider, MD  atenolol (TENORMIN) 25 MG tablet Take 25 mg by mouth every morning.    Yes Historical Provider, MD  folic acid (FOLVITE) A999333 MCG tablet Take 400 mcg by mouth daily.     Yes Historical Provider, MD  furosemide (LASIX) 40 MG tablet Take 1 tablet (40 mg total) by mouth daily.  07/13/14  Yes Ivan Anchors Love, PA-C  glimepiride (AMARYL) 1 MG tablet Take 1 tablet (1 mg total) by mouth daily before breakfast. 07/13/14  Yes Ivan Anchors Love, PA-C  Glucosamine HCl 1500 MG TABS Take 1,500 mg by mouth 2 (two) times daily.    Yes Historical Provider, MD  [place] 300 MG CAPS Take 1 capsule by mouth daily.    Yes Historical Provider, MD  Multiple Vitamin (MULTIVITAMIN WITH MINERALS) TABS tablet Take 1 tablet by mouth daily.   Yes Historical Provider, MD  Omega-3 Fatty Acids (FISH OIL) 1200 MG CAPS Take 1,200 mg by mouth daily.   Yes Historical Provider, MD  tamsulosin (FLOMAX) 0.4 MG CAPS capsule Take 1 capsule by mouth daily.   Yes Historical Provider, MD  warfarin (COUMADIN) 4 MG tablet Take 4 mg by mouth every evening.   Yes Historical Provider, MD  Albuterol Sulfate (PROAIR RESPICLICK) [id number] (90 BASE) MCG/ACT AEPB Inhale 2 puffs into the lungs at bedtime. Patient not taking: Reported on 02/13/2015 12/01/14   Rigoberto Noel, MD  fluticasone [facility]) 50 MCG/ACT nasal spray Place 1 spray into both nostrils at bedtime. Patient not taking: Reported on 02/13/2015 12/26/14   Rigoberto Noel, MD  HYDROcodone-acetaminophen (NORCO/VICODIN) 5-325 MG per tablet  as needed. 09/09/14   Historical Provider, MD  methocarbamol (ROBAXIN) 500 MG tablet Take 1 tablet (500 mg total) by mouth every 6 (six) hours as needed for muscle spasms. Patient not taking: Reported on 02/13/2015 07/13/14   Ivan Anchors Love, PA-C  senna-docusate (SENOKOT-S) 8.6-50 MG per tablet Take 2 tablets by mouth 2 (two) times daily. Patient not taking: Reported on 02/13/2015 07/13/14   Bary Leriche, PA-C    Allergies:  Allergies  Allergen Reactions  . Crestor [Rosuvastatin] Other (See Comments)    Aches in joint   . Lipitor [Atorvastatin] Other (See Comments)    Joint aching  . Penicillins Rash  . Tramadol Nausea Only  . Vicodin [Hydrocodone-Acetaminophen] Anxiety    "Made me nervous"    Social History   Social History  .  Marital Status: Married    Spouse Name: N/A  . Number of Children: 3  . Years of Education: N/A   Occupational History  . retired    Social History Main Topics  . Smoking status: Former Smoker -- 0.75 packs/day for 24 years    Types: Cigarettes    Quit date: 03/12/1968  . Smokeless tobacco: Former Systems developer    Types: Snuff, Chew    Quit date: 03/11/1968  . Alcohol Use: No  . Drug Use: No  . Sexual Activity: Not Currently   Other Topics Concern  . Not on file   Social History Narrative     Review of Systems:. Constitutional: negative for chills, fever, night sweats, weight changes, or fatigue  HEENT: negative for vision changes, hearing loss, congestion, rhinorrhea, ST, epistaxis, or sinus pressure Cardiovascular: negative for chest pain or palpitations Respiratory: negative for hemoptysis, wheezing, shortness of breath, or cough Abdominal: negative for abdominal pain, nausea, vomiting, diarrhea, or constipation Dermatological: negative for rash Neurologic: negative for headache, dizziness, or syncope All other systems reviewed and are otherwise negative with the exception to those above and in the HPI.   Physical Exam: Blood pressure 110/62, pulse 65, temperature 97.7 F (36.5 C), temperature source Oral, resp. rate 16, height 6\' 2"  (1.88 m), weight 257 lb (116.574 kg), SpO2 96 %., Body mass index is 32.98 kg/(m^2). General: Well developed, well nourished, in no acute distress. Head: Normocephalic, atraumatic, eyes without discharge, sclera non-icteric, nares are without discharge. Bilateral auditory canals clear Neck: Supple. No thyromegaly. Full ROM. No lymphadenopathy. Msk:  Strength and tone normal for age. Extremities/Skin: Warm and dry. No clubbing or cyanosis. No edema. No rashes or suspicious lesions. 5 cm arching laceration that is a flap in nature.  Neuro: Alert and oriented X 3. Moves all extremities spontaneously. Gait is normal. CNII-XII grossly in tact. Psych:   Responds to questions appropriately with a normal affect.   Wound on right dorsal hand was washed with soap and water for couple minutes and the tap water. It was then dried, Steri-Strips were applied 3, and then the wound edges were glued. Patient had full range of motion.  ASSESSMENT AND PLAN:  81 y.o. year old male with  This chart was scribed in my presence and reviewed by me personally.    ICD-9-CM ICD-10-CM   1. Open wound of right hand, initial encounter 882.0 S61.401A   2. Pain of right hand 729.5 M79.641      Signed, Robyn Haber, MD 02/13/2015 11:39 AM

## 2015-02-14 ENCOUNTER — Other Ambulatory Visit: Payer: Self-pay | Admitting: Orthopedic Surgery

## 2015-02-14 DIAGNOSIS — D509 Iron deficiency anemia, unspecified: Secondary | ICD-10-CM | POA: Diagnosis not present

## 2015-02-14 DIAGNOSIS — D508 Other iron deficiency anemias: Secondary | ICD-10-CM | POA: Diagnosis not present

## 2015-02-14 DIAGNOSIS — S72001A Fracture of unspecified part of neck of right femur, initial encounter for closed fracture: Secondary | ICD-10-CM

## 2015-02-16 ENCOUNTER — Ambulatory Visit (INDEPENDENT_AMBULATORY_CARE_PROVIDER_SITE_OTHER): Payer: Medicare Other | Admitting: Family Medicine

## 2015-02-16 VITALS — BP 110/60 | HR 80 | Temp 97.6°F | Resp 18

## 2015-02-16 DIAGNOSIS — S66821D Laceration of other specified muscles, fascia and tendons at wrist and hand level, right hand, subsequent encounter: Principal | ICD-10-CM

## 2015-02-16 DIAGNOSIS — S66921D Laceration of unspecified muscle, fascia and tendon at wrist and hand level, right hand, subsequent encounter: Secondary | ICD-10-CM

## 2015-02-16 DIAGNOSIS — S61401D Unspecified open wound of right hand, subsequent encounter: Secondary | ICD-10-CM

## 2015-02-16 NOTE — Progress Notes (Signed)
° °  Subjective:    Patient ID: Christopher [name], male    DOB: Feb 10, 1927, 81 y.o.   MRN: [id number] By signing my name below, I, Zola Button, attest that this documentation has been prepared under the direction and in the presence of Robyn Haber, MD.  Electronically Signed: Zola Button, Medical Scribe. 02/16/2015. 3:41 PM.  HPI HPI Comments: Christopher [name] is a 81 y.o. male who presents to the [facility] for a wound check. Patient was seen here 3 days ago by me for a laceration to his right hand after it was hit against a bathroom door. He notes he still has some mild pain to the area, but this has improved. He does have a history of DM.    Review of Systems  Skin: Positive for wound.       Objective:   Physical Exam CONSTITUTIONAL: Well developed/well nourished HEAD: Normocephalic/atraumatic EYES: EOM/PERRL ENMT: Mucous membranes moist NECK: supple no meningeal signs  GU: no cva tenderness NEURO: Pt is awake/alert, moves all extremitiesx4 EXTREMITIES: pulses normal, full ROM SKIN: Wound is dry. Minimal ecchymoses. Full range of motion of the hand PSYCH: no abnormalities of mood noted     Assessment & Plan:    Assessment: Wound looks great today. This chart was scribed in my presence and reviewed by me personally.    ICD-9-CM ICD-10-CM   1. Extensor tendon laceration of right hand with open wound, subsequent encounter V58.89 S66.921D    882.2      Follow-up in one week Signed, Robyn Haber, MD

## 2015-02-17 DIAGNOSIS — D508 Other iron deficiency anemias: Secondary | ICD-10-CM | POA: Diagnosis not present

## 2015-02-17 DIAGNOSIS — S72009A Fracture of unspecified part of neck of unspecified femur, initial encounter for closed fracture: Secondary | ICD-10-CM | POA: Diagnosis not present

## 2015-02-17 DIAGNOSIS — N184 Chronic kidney disease, stage 4 (severe): Secondary | ICD-10-CM | POA: Diagnosis not present

## 2015-02-17 DIAGNOSIS — E1122 Type 2 diabetes mellitus with diabetic chronic kidney disease: Secondary | ICD-10-CM | POA: Diagnosis not present

## 2015-02-17 DIAGNOSIS — Z7901 Long term (current) use of anticoagulants: Secondary | ICD-10-CM | POA: Diagnosis not present

## 2015-02-17 DIAGNOSIS — Z950 Presence of cardiac pacemaker: Secondary | ICD-10-CM | POA: Diagnosis not present

## 2015-02-17 DIAGNOSIS — Z0000 Encounter for general adult medical examination without abnormal findings: Secondary | ICD-10-CM | POA: Diagnosis not present

## 2015-02-17 DIAGNOSIS — E669 Obesity, unspecified: Secondary | ICD-10-CM | POA: Diagnosis not present

## 2015-02-17 DIAGNOSIS — E1151 Type 2 diabetes mellitus with diabetic peripheral angiopathy without gangrene: Secondary | ICD-10-CM | POA: Diagnosis not present

## 2015-02-17 DIAGNOSIS — I5033 Acute on chronic diastolic (congestive) heart failure: Secondary | ICD-10-CM | POA: Diagnosis not present

## 2015-02-17 DIAGNOSIS — Z6832 Body mass index (BMI) 32.0-32.9, adult: Secondary | ICD-10-CM | POA: Diagnosis not present

## 2015-02-17 DIAGNOSIS — I10 Essential (primary) hypertension: Secondary | ICD-10-CM | POA: Diagnosis not present

## 2015-02-20 ENCOUNTER — Emergency Department (HOSPITAL_COMMUNITY)
Admission: EM | Admit: 2015-02-20 | Discharge: 2015-02-20 | Disposition: A | Discharge status: Home / Self Care | Payer: Medicare Other | Source: Home / Self Care | Attending: Emergency Medicine | Admitting: Emergency Medicine

## 2015-02-20 ENCOUNTER — Emergency Department (HOSPITAL_COMMUNITY): Payer: Medicare Other

## 2015-02-20 ENCOUNTER — Encounter (HOSPITAL_COMMUNITY): Payer: Self-pay | Admitting: Family Medicine

## 2015-02-20 DIAGNOSIS — R0602 Shortness of breath: Secondary | ICD-10-CM | POA: Diagnosis not present

## 2015-02-20 DIAGNOSIS — I509 Heart failure, unspecified: Secondary | ICD-10-CM | POA: Diagnosis not present

## 2015-02-20 DIAGNOSIS — Z87891 Personal history of nicotine dependence: Secondary | ICD-10-CM | POA: Diagnosis not present

## 2015-02-20 DIAGNOSIS — I130 Hypertensive heart and chronic kidney disease with heart failure and stage 1 through stage 4 chronic kidney disease, or unspecified chronic kidney disease: Secondary | ICD-10-CM | POA: Diagnosis not present

## 2015-02-20 LAB — BASIC METABOLIC PANEL
Anion gap: 7 (ref 5–15)
BUN: 65 mg/dL — AB (ref 6–20)
CHLORIDE: 109 mmol/L (ref 101–111)
CO2: 27 mmol/L (ref 22–32)
Calcium: 9 mg/dL (ref 8.9–10.3)
Creatinine, Ser: 3.37 mg/dL — ABNORMAL HIGH (ref 0.61–1.24)
GFR calc Af Amer: 18 mL/min — ABNORMAL LOW (ref >60)
GFR calc non Af Amer: 15 mL/min — ABNORMAL LOW (ref >60)
Glucose, Bld: 117 mg/dL — ABNORMAL HIGH (ref 65–99)
POTASSIUM: 4.4 mmol/L (ref 3.5–5.1)
SODIUM: 143 mmol/L (ref 135–145)

## 2015-02-20 LAB — CBC WITH DIFFERENTIAL/PLATELET
Basophils Absolute: 0 10*3/uL (ref 0.0–0.1)
Basophils Relative: 0 %
EOS ABS: 0 10*3/uL (ref 0.0–0.7)
Eosinophils Relative: 0 %
HEMATOCRIT: 30.6 % — AB (ref 39.0–52.0)
HEMOGLOBIN: 9.2 g/dL — AB (ref 13.0–17.0)
LYMPHS ABS: 0.7 10*3/uL (ref 0.7–4.0)
LYMPHS PCT: 11 %
MCH: 29.6 pg (ref 26.0–34.0)
MCHC: 30.1 g/dL (ref 30.0–36.0)
MCV: 98.4 fL (ref 78.0–100.0)
MONOS PCT: 6 %
Monocytes Absolute: 0.4 10*3/uL (ref 0.1–1.0)
NEUTROS ABS: 5.7 10*3/uL (ref 1.7–7.7)
NEUTROS PCT: 83 %
Platelets: 292 10*3/uL (ref 150–400)
RBC: 3.11 MIL/uL — AB (ref 4.22–5.81)
RDW: 17.5 % — ABNORMAL HIGH (ref 11.5–15.5)
WBC: 6.8 10*3/uL (ref 4.0–10.5)

## 2015-02-20 LAB — TROPONIN I

## 2015-02-20 LAB — BRAIN NATRIURETIC PEPTIDE: B Natriuretic Peptide: 847.1 pg/mL — ABNORMAL HIGH (ref 0.0–100.0)

## 2015-02-20 MED ORDER — FUROSEMIDE 10 MG/ML IJ SOLN
40.0000 mg | Freq: Once | INTRAMUSCULAR | Status: AC
Start: 2015-02-20 — End: 2015-02-20
  Administered 2015-02-20: 40 mg via INTRAVENOUS
  Filled 2015-02-20: qty 4

## 2015-02-20 NOTE — ED Notes (Signed)
Pt here for SOB more with exertion and at night. sts swelling in legs. Pt very pale and sts weak.

## 2015-02-20 NOTE — ED Provider Notes (Signed)
CSN: [id number]     Arrival date & time 02/20/15  [id number] History   First MD Initiated Contact with Patient 02/20/15 [phone]     Chief Complaint  Patient presents with  . Shortness of Breath    Patient is a 81 y.o. male presenting with shortness of breath. The history is provided by the patient.  Shortness of Breath Severity:  Mild Onset quality:  Gradual Duration:  1 week Timing:  Intermittent Progression:  Waxing and waning Chronicity:  New Relieved by:  Sitting up and rest Worsened by:  Exertion (and lying flat) Ineffective treatments:  Diuretics Associated symptoms: no abdominal pain, no chest pain, no cough, no fever, no headaches, no neck pain, no rash, no sore throat and no vomiting    Past Medical History  Diagnosis Date  . Lumbar disc disease   . BPH (benign prostatic hyperplasia)   . HTN (hypertension)   . Diabetes mellitus type II   . Sinus node dysfunction (HCC)     symptomatic bradycardia  . Pacemaker     Medtronic-dual-chamber-DOI 2012  . CAD (coronary artery disease)     followed by Dr. Wynonia Lawman  . Arthritis   . History of skin cancer   . Frequency of urination   . Nocturia   . Chronic kidney disease     kidneys function at "20 %" followed by Dr. Mercy Moore  . Cancer [facility])     history of skin cancer  . Dysrhythmia     a fib  . Prostate cancer ([place])   . Ruptured disk 1992  . Skin cancer    Past Surgical History  Procedure Laterality Date  . Coronary artery bypass graft  2003    x3  . Cardiac catheterization    . Lumbar laminectomy  11/01  . Cardioversion  08/08/2011    Procedure: CARDIOVERSION;  Surgeon: Jacolyn Reedy, MD;  Location: [place];  Service: Cardiovascular;  Laterality: N/A;  . Cardioversion N/A 07/16/2012    Procedure: CARDIOVERSION;  Surgeon: Jacolyn Reedy, MD;  Location: [place];  Service: Cardiovascular;  Laterality: N/A;  . Pacemaker insertion  2012  . Cataracts removed    . Cystoscopy with biopsy N/A 06/21/2013    Procedure:  CYSTOSCOPY WITH BIOPSY;  Surgeon: Molli Hazard, MD;  Location: [facility];  Service: Urology;  Laterality: N/A;    TUR RESECTION OF POLYP BILATERAL RETROGRADE PYELOGRAM BLADDER BIOPSY    . Prostate biopsy N/A 06/21/2013    Procedure: BIOPSY TRANSRECTAL ULTRASONIC PROSTATE (TUBP);  Surgeon: Molli Hazard, MD;  Location: [facility];  Service: Urology;  Laterality: N/A;  PROSTATE NERVE BLOCK  . Appendectomy  1980  . Hip pinning,cannulated Right 06/30/2014    Procedure: CANNULATED HIP PINNING;  Surgeon: Renette Butters, MD;  Location: [place];  Service: Orthopedics;  Laterality: Right;   Family History  Problem Relation Age of Onset  . Cancer Mother     breast  . Cancer Father     prostate, esophagus   Social History  Substance Use Topics  . Smoking status: Former Smoker -- 0.75 packs/day for 24 years    Types: Cigarettes    Quit date: 03/12/1968  . Smokeless tobacco: Former Systems developer    Types: Snuff, Chew    Quit date: 03/11/1968  . Alcohol Use: No    Review of Systems  Constitutional: Negative for fever and chills.  HENT: Negative for rhinorrhea and sore throat.   Eyes: Negative for visual disturbance.  Respiratory: Positive for  shortness of breath. Negative for cough.   Cardiovascular: Positive for leg swelling. Negative for chest pain.  Gastrointestinal: Negative for nausea, vomiting, abdominal pain, diarrhea and constipation.  Genitourinary: Negative for dysuria and hematuria.  Musculoskeletal: Negative for back pain and neck pain.  Skin: Negative for rash.  Neurological: Negative for syncope and headaches.  Psychiatric/Behavioral: Negative for confusion.  All other systems reviewed and are negative.  Allergies  Crestor; Lipitor; Penicillins; Tramadol; and Vicodin  Home Medications   Prior to Admission medications   Medication Sig Start Date End Date Taking? Authorizing Provider  amiodarone (PACERONE) 200 MG tablet Take 100 mg by mouth every morning.    Yes  Historical Provider, MD  atenolol (TENORMIN) 25 MG tablet Take 25 mg by mouth every morning.    Yes Historical Provider, MD  Calcium Carb-Cholecalciferol (CALTRATE 600+D3 SOFT PO) Take 2 tablets by mouth 2 (two) times daily.   Yes Historical Provider, MD  folic acid (FOLVITE) A999333 MCG tablet Take 400 mcg by mouth daily.     Yes Historical Provider, MD  furosemide (LASIX) 40 MG tablet Take 1 tablet (40 mg total) by mouth daily. Patient taking differently: Take 80 mg by mouth daily.  07/13/14  Yes Ivan Anchors Love, PA-C  glimepiride (AMARYL) 1 MG tablet Take 1 tablet (1 mg total) by mouth daily before breakfast. 07/13/14  Yes Ivan Anchors Love, PA-C  Glucosamine HCl 1500 MG TABS Take 1,500 mg by mouth 2 (two) times daily.    Yes Historical Provider, MD  [place] 300 MG CAPS Take 1 capsule by mouth daily.    Yes Historical Provider, MD  Multiple Vitamin (MULTIVITAMIN WITH MINERALS) TABS tablet Take 1 tablet by mouth daily.   Yes Historical Provider, MD  Omega-3 Fatty Acids (FISH OIL) 1200 MG CAPS Take 1,200 mg by mouth daily.   Yes Historical Provider, MD  tamsulosin (FLOMAX) 0.4 MG CAPS capsule Take 1 capsule by mouth daily.   Yes Historical Provider, MD  warfarin (COUMADIN) 4 MG tablet Take 2-4 mg by mouth every evening. Take 2 mg on Friday only, and 4mg  on all other days   Yes Historical Provider, MD  Albuterol Sulfate (PROAIR RESPICLICK) 123XX123 (90 BASE) MCG/ACT AEPB Inhale 2 puffs into the lungs at bedtime. Patient not taking: Reported on 02/13/2015 12/01/14   Rigoberto Noel, MD  fluticasone [employer]) 50 MCG/ACT nasal spray Place 1 spray into both nostrils at bedtime. Patient not taking: Reported on 02/13/2015 12/26/14   Rigoberto Noel, MD  methocarbamol (ROBAXIN) 500 MG tablet Take 1 tablet (500 mg total) by mouth every 6 (six) hours as needed for muscle spasms. Patient not taking: Reported on 02/13/2015 07/13/14   Ivan Anchors Love, PA-C  senna-docusate (SENOKOT-S) 8.6-50 MG per tablet Take 2 tablets by mouth 2  (two) times daily. Patient not taking: Reported on 02/13/2015 07/13/14   Ivan Anchors Love, PA-C   BP 120/71 mmHg  Pulse 71  Temp(Src) 97.6 F (36.4 C) (Oral)  Resp 20  Wt 112.634 kg  SpO2 100% Physical Exam  Constitutional: He is oriented to person, place, and time. He appears well-developed and well-nourished. No distress.  HENT:  Head: Normocephalic and atraumatic.  Mouth/Throat: Oropharynx is clear and moist.  Eyes: EOM are normal.  Neck: Neck supple. No JVD present.  Cardiovascular: Normal rate, regular rhythm, normal heart sounds and intact distal pulses.   Pulmonary/Chest: Effort normal. No respiratory distress. He has no wheezes. He has rales (bibasilar).  Abdominal: Soft. He exhibits no  distension. There is no tenderness.  Musculoskeletal: Normal range of motion. He exhibits edema (BLE pitting 1+ to mid-shin).  Neurological: He is alert and oriented to person, place, and time. No cranial nerve deficit.  Skin: Skin is warm and dry.  Psychiatric: His behavior is normal.    ED Course  Procedures  None   Labs Review Labs Reviewed  CBC WITH DIFFERENTIAL/PLATELET - Abnormal; Notable for the following:    RBC 3.11 (*)    Hemoglobin 9.2 (*)    HCT 30.6 (*)    RDW 17.5 (*)    All other components within normal limits  BASIC METABOLIC PANEL - Abnormal; Notable for the following:    Glucose, Bld 117 (*)    BUN 65 (*)    Creatinine, Ser 3.37 (*)    GFR calc non Af Amer 15 (*)    GFR calc Af Amer 18 (*)    All other components within normal limits  BRAIN NATRIURETIC PEPTIDE - Abnormal; Notable for the following:    B Natriuretic Peptide 847.1 (*)    All other components within normal limits  TROPONIN I    Imaging Review Dg Chest 2 View  02/20/2015  CLINICAL DATA:  Worsening shortness of breath and wheezing over the past several months. Shortness of breath is worse at night. Subsequent encounter. EXAM: CHEST  2 VIEW COMPARISON:  PA and lateral chest 10/11/2014.  CT chest  12/01/2014. FINDINGS: Mild bibasilar atelectasis is seen. The lungs are otherwise clear. No pneumothorax or pleural effusion. Heart size is mildly enlarged with a pacing device in place. The patient is status post CABG. IMPRESSION: No acute disease. Electronically Signed   By: Inge Rise M.D.   On: 02/20/2015 09:50   I have personally reviewed and evaluated these images and lab results as part of my medical decision-making.   EKG Interpretation   Date/Time:  Monday February 20 2015 08:43:02 EST Ventricular Rate:  71 PR Interval:    QRS Duration: 178 QT Interval:  494 QTC Calculation: 536 R Axis:   -60 Text Interpretation:  Ventricular-paced rhythm Abnormal ECG Confirmed by  ZAVITZ  MD, JOSHUA ([id number]) on 02/20/2015 8:51:40 AM      MDM   Final diagnoses:  Acute on chronic congestive heart failure, unspecified congestive heart failure type [facility])    Patient is an 81 year old male with history of CAD, CHF, COPD who presents with shortness of breath. Patient has had 2 pillow orthopnea and dyspnea on exertion for the last week. Patient states per his PCP instructions, he has been increasing his Lasix dose to 2 tablets in the morning and 2 tablets in the evening. He has bilateral Lotrimin pitting edema and bibasilar crackles. He symptomatically stable with satisfactory oxygen saturation on room air. Suspect mild CHF exacerbation. We will obtain labs and chest x-ray.  Labs with mildly elevated BNP from prior. Trop negative. CXR with mild bibasilar atelectasis versus edema. No effusion. Stable cardiomegaly. EKG reassuring with known V pacer. Gave IV lasix. Clinically feel patient will do well with close outpatient follow up to discuss optimizing his diuretic protocol. Patient voices understanding and is agreeable with plan. Stable for d/c.  Discussed with Dr. Reather Converse.  Gustavus Bryant, MD 02/20/15 1055  Elnora Morrison, MD 02/20/15 [phone]

## 2015-02-22 ENCOUNTER — Ambulatory Visit
Admission: RE | Admit: 2015-02-22 | Discharge: 2015-02-22 | Disposition: A | Discharge status: Home / Self Care | Payer: Medicare Other | Source: Ambulatory Visit | Attending: Orthopedic Surgery | Admitting: Orthopedic Surgery

## 2015-02-22 DIAGNOSIS — E785 Hyperlipidemia, unspecified: Secondary | ICD-10-CM | POA: Diagnosis not present

## 2015-02-22 DIAGNOSIS — S72001A Fracture of unspecified part of neck of right femur, initial encounter for closed fracture: Secondary | ICD-10-CM | POA: Diagnosis not present

## 2015-02-22 DIAGNOSIS — Z79899 Other long term (current) drug therapy: Secondary | ICD-10-CM | POA: Diagnosis not present

## 2015-02-22 DIAGNOSIS — R0602 Shortness of breath: Secondary | ICD-10-CM | POA: Diagnosis not present

## 2015-02-22 DIAGNOSIS — I119 Hypertensive heart disease without heart failure: Secondary | ICD-10-CM | POA: Diagnosis not present

## 2015-02-22 DIAGNOSIS — I2510 Atherosclerotic heart disease of native coronary artery without angina pectoris: Secondary | ICD-10-CM | POA: Diagnosis not present

## 2015-02-22 DIAGNOSIS — E1122 Type 2 diabetes mellitus with diabetic chronic kidney disease: Secondary | ICD-10-CM | POA: Diagnosis not present

## 2015-02-22 DIAGNOSIS — I480 Paroxysmal atrial fibrillation: Secondary | ICD-10-CM | POA: Diagnosis not present

## 2015-02-22 DIAGNOSIS — Z950 Presence of cardiac pacemaker: Secondary | ICD-10-CM | POA: Diagnosis not present

## 2015-02-22 DIAGNOSIS — Z951 Presence of aortocoronary bypass graft: Secondary | ICD-10-CM | POA: Diagnosis not present

## 2015-02-22 DIAGNOSIS — Z7901 Long term (current) use of anticoagulants: Secondary | ICD-10-CM | POA: Diagnosis not present

## 2015-02-22 DIAGNOSIS — I5032 Chronic diastolic (congestive) heart failure: Secondary | ICD-10-CM | POA: Diagnosis not present

## 2015-02-22 DIAGNOSIS — N184 Chronic kidney disease, stage 4 (severe): Secondary | ICD-10-CM | POA: Diagnosis not present

## 2015-02-23 ENCOUNTER — Encounter (HOSPITAL_COMMUNITY): Payer: Self-pay | Admitting: *Deleted

## 2015-02-23 ENCOUNTER — Emergency Department (HOSPITAL_COMMUNITY): Payer: Medicare Other

## 2015-02-23 ENCOUNTER — Inpatient Hospital Stay (HOSPITAL_COMMUNITY)
Admission: EM | Admit: 2015-02-23 | Discharge: 2015-02-28 | DRG: 291 | Disposition: A | Discharge status: Home / Self Care | Payer: Medicare Other | Attending: Cardiology | Admitting: Cardiology

## 2015-02-23 DIAGNOSIS — N184 Chronic kidney disease, stage 4 (severe): Secondary | ICD-10-CM | POA: Diagnosis present

## 2015-02-23 DIAGNOSIS — I5031 Acute diastolic (congestive) heart failure: Secondary | ICD-10-CM | POA: Diagnosis not present

## 2015-02-23 DIAGNOSIS — E669 Obesity, unspecified: Secondary | ICD-10-CM | POA: Diagnosis present

## 2015-02-23 DIAGNOSIS — N179 Acute kidney failure, unspecified: Secondary | ICD-10-CM | POA: Diagnosis not present

## 2015-02-23 DIAGNOSIS — D509 Iron deficiency anemia, unspecified: Secondary | ICD-10-CM | POA: Diagnosis present

## 2015-02-23 DIAGNOSIS — D631 Anemia in chronic kidney disease: Secondary | ICD-10-CM | POA: Diagnosis present

## 2015-02-23 DIAGNOSIS — N400 Enlarged prostate without lower urinary tract symptoms: Secondary | ICD-10-CM | POA: Diagnosis present

## 2015-02-23 DIAGNOSIS — E1122 Type 2 diabetes mellitus with diabetic chronic kidney disease: Secondary | ICD-10-CM | POA: Diagnosis present

## 2015-02-23 DIAGNOSIS — C61 Malignant neoplasm of prostate: Secondary | ICD-10-CM | POA: Diagnosis present

## 2015-02-23 DIAGNOSIS — Z950 Presence of cardiac pacemaker: Secondary | ICD-10-CM

## 2015-02-23 DIAGNOSIS — E876 Hypokalemia: Secondary | ICD-10-CM | POA: Diagnosis not present

## 2015-02-23 DIAGNOSIS — Z880 Allergy status to penicillin: Secondary | ICD-10-CM | POA: Diagnosis not present

## 2015-02-23 DIAGNOSIS — Z85828 Personal history of other malignant neoplasm of skin: Secondary | ICD-10-CM | POA: Diagnosis not present

## 2015-02-23 DIAGNOSIS — I509 Heart failure, unspecified: Secondary | ICD-10-CM

## 2015-02-23 DIAGNOSIS — E785 Hyperlipidemia, unspecified: Secondary | ICD-10-CM | POA: Diagnosis present

## 2015-02-23 DIAGNOSIS — M545 Low back pain: Secondary | ICD-10-CM | POA: Diagnosis present

## 2015-02-23 DIAGNOSIS — J449 Chronic obstructive pulmonary disease, unspecified: Secondary | ICD-10-CM | POA: Diagnosis present

## 2015-02-23 DIAGNOSIS — E8770 Fluid overload, unspecified: Secondary | ICD-10-CM | POA: Diagnosis not present

## 2015-02-23 DIAGNOSIS — Z888 Allergy status to other drugs, medicaments and biological substances status: Secondary | ICD-10-CM | POA: Diagnosis not present

## 2015-02-23 DIAGNOSIS — Z720 Tobacco use: Secondary | ICD-10-CM | POA: Diagnosis not present

## 2015-02-23 DIAGNOSIS — Z6830 Body mass index (BMI) 30.0-30.9, adult: Secondary | ICD-10-CM

## 2015-02-23 DIAGNOSIS — Z79899 Other long term (current) drug therapy: Secondary | ICD-10-CM | POA: Diagnosis not present

## 2015-02-23 DIAGNOSIS — I25812 Atherosclerosis of bypass graft of coronary artery of transplanted heart without angina pectoris: Secondary | ICD-10-CM | POA: Diagnosis not present

## 2015-02-23 DIAGNOSIS — Z885 Allergy status to narcotic agent status: Secondary | ICD-10-CM | POA: Diagnosis not present

## 2015-02-23 DIAGNOSIS — I129 Hypertensive chronic kidney disease with stage 1 through stage 4 chronic kidney disease, or unspecified chronic kidney disease: Secondary | ICD-10-CM | POA: Diagnosis not present

## 2015-02-23 DIAGNOSIS — I130 Hypertensive heart and chronic kidney disease with heart failure and stage 1 through stage 4 chronic kidney disease, or unspecified chronic kidney disease: Principal | ICD-10-CM | POA: Diagnosis present

## 2015-02-23 DIAGNOSIS — I2510 Atherosclerotic heart disease of native coronary artery without angina pectoris: Secondary | ICD-10-CM | POA: Diagnosis present

## 2015-02-23 DIAGNOSIS — G8929 Other chronic pain: Secondary | ICD-10-CM | POA: Diagnosis present

## 2015-02-23 DIAGNOSIS — Z7901 Long term (current) use of anticoagulants: Secondary | ICD-10-CM | POA: Diagnosis not present

## 2015-02-23 DIAGNOSIS — Z809 Family history of malignant neoplasm, unspecified: Secondary | ICD-10-CM | POA: Diagnosis not present

## 2015-02-23 DIAGNOSIS — I110 Hypertensive heart disease with heart failure: Secondary | ICD-10-CM | POA: Diagnosis not present

## 2015-02-23 DIAGNOSIS — I5033 Acute on chronic diastolic (congestive) heart failure: Secondary | ICD-10-CM | POA: Diagnosis present

## 2015-02-23 DIAGNOSIS — I4891 Unspecified atrial fibrillation: Secondary | ICD-10-CM | POA: Diagnosis not present

## 2015-02-23 DIAGNOSIS — E1129 Type 2 diabetes mellitus with other diabetic kidney complication: Secondary | ICD-10-CM | POA: Diagnosis not present

## 2015-02-23 DIAGNOSIS — R0602 Shortness of breath: Secondary | ICD-10-CM | POA: Diagnosis not present

## 2015-02-23 DIAGNOSIS — Z951 Presence of aortocoronary bypass graft: Secondary | ICD-10-CM

## 2015-02-23 DIAGNOSIS — I120 Hypertensive chronic kidney disease with stage 5 chronic kidney disease or end stage renal disease: Secondary | ICD-10-CM | POA: Diagnosis not present

## 2015-02-23 DIAGNOSIS — I480 Paroxysmal atrial fibrillation: Secondary | ICD-10-CM | POA: Diagnosis present

## 2015-02-23 HISTORY — DX: Hypertensive heart disease without heart failure: I11.9

## 2015-02-23 HISTORY — DX: Unspecified atrial fibrillation: I48.91

## 2015-02-23 HISTORY — DX: Presence of cardiac pacemaker: Z95.0

## 2015-02-23 LAB — COMPREHENSIVE METABOLIC PANEL
ALT: 36 U/L (ref 17–63)
ANION GAP: 8 (ref 5–15)
AST: 34 U/L (ref 15–41)
Albumin: 3.1 g/dL — ABNORMAL LOW (ref 3.5–5.0)
Alkaline Phosphatase: 49 U/L (ref 38–126)
BUN: 66 mg/dL — AB (ref 6–20)
CALCIUM: 9 mg/dL (ref 8.9–10.3)
CHLORIDE: 105 mmol/L (ref 101–111)
CO2: 29 mmol/L (ref 22–32)
Creatinine, Ser: 3.31 mg/dL — ABNORMAL HIGH (ref 0.61–1.24)
GFR calc non Af Amer: 16 mL/min — ABNORMAL LOW (ref >60)
GFR, EST AFRICAN AMERICAN: 18 mL/min — AB (ref >60)
GLUCOSE: 98 mg/dL (ref 65–99)
POTASSIUM: 3.7 mmol/L (ref 3.5–5.1)
SODIUM: 142 mmol/L (ref 135–145)
Total Bilirubin: 0.8 mg/dL (ref 0.3–1.2)
Total Protein: 5.8 g/dL — ABNORMAL LOW (ref 6.5–8.1)

## 2015-02-23 LAB — CBC WITH DIFFERENTIAL/PLATELET
BASOS ABS: 0 10*3/uL (ref 0.0–0.1)
BASOS PCT: 0 %
Eosinophils Absolute: 0 10*3/uL (ref 0.0–0.7)
Eosinophils Relative: 0 %
HEMATOCRIT: 30.4 % — AB (ref 39.0–52.0)
HEMOGLOBIN: 9.2 g/dL — AB (ref 13.0–17.0)
LYMPHS PCT: 13 %
Lymphs Abs: 0.9 10*3/uL (ref 0.7–4.0)
MCH: 30.1 pg (ref 26.0–34.0)
MCHC: 30.3 g/dL (ref 30.0–36.0)
MCV: 99.3 fL (ref 78.0–100.0)
MONO ABS: 0.4 10*3/uL (ref 0.1–1.0)
Monocytes Relative: 6 %
NEUTROS ABS: 5.7 10*3/uL (ref 1.7–7.7)
NEUTROS PCT: 81 %
Platelets: 256 10*3/uL (ref 150–400)
RBC: 3.06 MIL/uL — AB (ref 4.22–5.81)
RDW: 17.7 % — AB (ref 11.5–15.5)
WBC: 7 10*3/uL (ref 4.0–10.5)

## 2015-02-23 LAB — PROTIME-INR
INR: 2.44 — ABNORMAL HIGH (ref 0.00–1.49)
PROTHROMBIN TIME: 26.2 s — AB (ref 11.6–15.2)

## 2015-02-23 LAB — TROPONIN I: Troponin I: 0.04 ng/mL — ABNORMAL HIGH (ref <0.031)

## 2015-02-23 LAB — BRAIN NATRIURETIC PEPTIDE: B NATRIURETIC PEPTIDE 5: 877.8 pg/mL — AB (ref 0.0–100.0)

## 2015-02-23 LAB — BASIC METABOLIC PANEL
Anion gap: 10 (ref 5–15)
BUN: 62 mg/dL — ABNORMAL HIGH (ref 6–20)
CALCIUM: 9.4 mg/dL (ref 8.9–10.3)
CO2: 30 mmol/L (ref 22–32)
Chloride: 103 mmol/L (ref 101–111)
Creatinine, Ser: 3.37 mg/dL — ABNORMAL HIGH (ref 0.61–1.24)
GFR, EST AFRICAN AMERICAN: 18 mL/min — AB (ref >60)
GFR, EST NON AFRICAN AMERICAN: 15 mL/min — AB (ref >60)
GLUCOSE: 102 mg/dL — AB (ref 65–99)
POTASSIUM: 3.9 mmol/L (ref 3.5–5.1)
Sodium: 143 mmol/L (ref 135–145)

## 2015-02-23 LAB — GLUCOSE, CAPILLARY
Glucose-Capillary: 87 mg/dL (ref 65–99)
Glucose-Capillary: 131 mg/dL — ABNORMAL HIGH (ref 65–99)

## 2015-02-23 LAB — MAGNESIUM: MAGNESIUM: 2.4 mg/dL (ref 1.7–2.4)

## 2015-02-23 MED ORDER — FLUTICASONE PROPIONATE 50 MCG/ACT NA SUSP
1.0000 | Freq: Every day | NASAL | Status: DC
  Administered 2015-02-23 – 2015-02-27 (×5): 1 via NASAL
  Filled 2015-02-23: qty 16

## 2015-02-23 MED ORDER — METOLAZONE 2.5 MG PO TABS
2.5000 mg | ORAL_TABLET | Freq: Two times a day (BID) | ORAL | Status: AC
  Administered 2015-02-23 – 2015-02-24 (×2): 2.5 mg via ORAL
  Filled 2015-02-23 (×2): qty 1

## 2015-02-23 MED ORDER — WARFARIN - PHARMACIST DOSING INPATIENT
Freq: Every day | Status: DC
Start: 2015-02-23 — End: 2015-02-28
  Administered 2015-02-23 – 2015-02-26 (×3)

## 2015-02-23 MED ORDER — SODIUM CHLORIDE 0.9 % IJ SOLN: 3.0000 mL | INTRAMUSCULAR | Status: DC | PRN

## 2015-02-23 MED ORDER — FUROSEMIDE 10 MG/ML IJ SOLN
80.0000 mg | Freq: Four times a day (QID) | INTRAMUSCULAR | Status: DC
  Administered 2015-02-23 – 2015-02-25 (×7): 80 mg via INTRAVENOUS
  Filled 2015-02-23 (×9): qty 8

## 2015-02-23 MED ORDER — GLIMEPIRIDE 1 MG PO TABS
1.0000 mg | ORAL_TABLET | Freq: Every day | ORAL | Status: DC
  Administered 2015-02-24 – 2015-02-28 (×4): 1 mg via ORAL
  Filled 2015-02-23 (×8): qty 1

## 2015-02-23 MED ORDER — GLUCOSAMINE HCL 1500 MG PO TABS: 1500.0000 mg | ORAL_TABLET | Freq: Two times a day (BID) | ORAL | Status: DC

## 2015-02-23 MED ORDER — TAMSULOSIN HCL 0.4 MG PO CAPS
0.4000 mg | ORAL_CAPSULE | Freq: Every day | ORAL | Status: DC
  Administered 2015-02-23 – 2015-02-28 (×6): 0.4 mg via ORAL
  Filled 2015-02-23 (×6): qty 1

## 2015-02-23 MED ORDER — SODIUM CHLORIDE 0.9 % IV SOLN
250.0000 mL | INTRAVENOUS | Status: DC | PRN
  Administered 2015-02-27: 14:00:00 via INTRAVENOUS

## 2015-02-23 MED ORDER — ACETAMINOPHEN 325 MG PO TABS: 650.0000 mg | ORAL_TABLET | ORAL | Status: DC | PRN

## 2015-02-23 MED ORDER — AMIODARONE HCL 200 MG PO TABS
200.0000 mg | ORAL_TABLET | Freq: Every day | ORAL | Status: DC
  Administered 2015-02-24 – 2015-02-28 (×5): 200 mg via ORAL
  Filled 2015-02-23 (×5): qty 1

## 2015-02-23 MED ORDER — SODIUM CHLORIDE 0.9 % IJ SOLN
3.0000 mL | Freq: Two times a day (BID) | INTRAMUSCULAR | Status: DC
  Administered 2015-02-23 – 2015-02-27 (×8): 3 mL via INTRAVENOUS

## 2015-02-23 MED ORDER — SENNOSIDES-DOCUSATE SODIUM 8.6-50 MG PO TABS
2.0000 | ORAL_TABLET | Freq: Two times a day (BID) | ORAL | Status: DC
  Administered 2015-02-23 – 2015-02-28 (×10): 2 via ORAL
  Filled 2015-02-23 (×11): qty 2

## 2015-02-23 MED ORDER — ONDANSETRON HCL 4 MG/2ML IJ SOLN: 4.0000 mg | Freq: Four times a day (QID) | INTRAMUSCULAR | Status: DC | PRN

## 2015-02-23 MED ORDER — ATENOLOL 25 MG PO TABS
25.0000 mg | ORAL_TABLET | Freq: Every morning | ORAL | Status: DC
  Administered 2015-02-24 – 2015-02-28 (×5): 25 mg via ORAL
  Filled 2015-02-23 (×6): qty 1

## 2015-02-23 MED ORDER — WARFARIN SODIUM 2 MG PO TABS
4.0000 mg | ORAL_TABLET | Freq: Once | ORAL | Status: AC
  Administered 2015-02-23: 4 mg via ORAL
  Filled 2015-02-23: qty 1
  Filled 2015-02-23: qty 2

## 2015-02-23 NOTE — ED Provider Notes (Signed)
CSN: [id number]     Arrival date & time 02/23/15  1024 History   First MD Initiated Contact with Patient 02/23/15 1036     Chief Complaint  Patient presents with  . Shortness of Breath      HPI  Presents for evaluation of shortness of breath and a frequent cough patient has history of congestive heart failure. Paroxysmal A. fib. Has a pacemaker. Sees Dr. Wynonia Lawman of cardiology.  Types several months of dyspnea on exertion. He had a hip replacement in April and admits that he is slower because he walks with a walker now. However, his dyspnea limits him as well. For the last 3 weeks, he has had orthopnea the point of having to sleep sitting upright. Seen here and evaluated days ago. Showed edema but no frank CHF on x-ray. Saw Dr. Wynonia Lawman yesterday. He states he was told yesterday that he was "back out of heart rhythm". Also states that he was told his renal function was worsening. He recently saw pulmonary. Was asked to decrease his amiodarone dosage which she did, after speaking with Dr. Wynonia Lawman. This was in hopes of avoiding pulmonary toxicity from his amiodarone I assume. However, he was asked yesterday to increase his amiodarone again because of his atrial fibrillation. He is anticoagulated with Coumadin.  I cough. Nonproductive. No hemoptysis. No fevers or chills. Increasing edema. His weight went from 2:15 in April 2 252 recently. He is up 14 pounds in the last 10 days.      Echo 07/2013:: LV EF: 55% -  60% - Left ventricle: The cavity size was normal. Wall thickness was increased in a pattern of mild LVH. Systolic function was normal. The estimated ejection fraction was in the range of 55% to 60%. There is diastolic dysfunction, indeterminate grade. There is evidence of elevated LA pressure E/e&' 15. - Aortic valve: Mildly calcified annulus. Trileaflet; mildly thickened leaflets.  - Mitral valve: Mildly calcified annulus. Mildly thickened leaflets . There was mild  regurgitation. - Left atrium: The atrium was moderately dilated. - Right atrium: The atrium was mildly dilated. - Tricuspid valve: There was mild-moderate regurgitation. - Pulmonic valve: There was moderate regurgitation. - Pulmonary arteries: PASP is at least 49 mmHg, unable to estimate RA pressure becaue IV not visualized. PASP is at least moderately elevated. - Technically difficult study.  Past Medical History  Diagnosis Date  . Lumbar disc disease   . BPH (benign prostatic hyperplasia)   . HTN (hypertension)   . Sinus node dysfunction (HCC)     symptomatic bradycardia  . Pacemaker     Medtronic-dual-chamber-DOI 2012  . CAD (coronary artery disease)     followed by Dr. Wynonia Lawman  . History of skin cancer   . Chronic kidney disease     kidneys function at "20 %" followed by Dr. Mercy Moore  . Prostate cancer ([place])   . Lumbar disc disease 1992  . Atrial fibrillation ([place]) 08/07/2011    CHADS score  2 CHADS2 VASC score 4  Onset winter of 2013 Cardioversion May 2013 Reversion to a fib 3/14 amiodarone begun 07/02/12  Repeat cardioversion Jul 16, 2012    . CAD (coronary artery disease) 03/06/2010    CABG with LIMA to LAD, SVG to OM-OM2  05/05/01 Dr. Roxan Hockey  Cardiac Cath 12/2009 60% left main, occluded LAD, Widely patent SVG to OM1 and 2, patent LIMA, widely patent RCA    . Cardiac pacemaker in situ     07/26/10  Inserted for chronotropic incompetence and  dyspnea  RA lead  Medtronic 5076  serial number [id number]   RV lead  Medtronic 5076  serial number [id number] Medtronic Adapta RL pulse generator, SN  [id number]    . Hypertensive heart disease without CHF    Past Surgical History  Procedure Laterality Date  . Coronary artery bypass graft  2003    x3  . Cardiac catheterization    . Lumbar laminectomy  11/01  . Cardioversion  08/08/2011    Procedure: CARDIOVERSION;  Surgeon: Jacolyn Reedy, MD;  Location: [facility];  Service: Cardiovascular;  Laterality: N/A;  . Cardioversion N/A  07/16/2012    Procedure: CARDIOVERSION;  Surgeon: Jacolyn Reedy, MD;  Location: [facility];  Service: Cardiovascular;  Laterality: N/A;  . Pacemaker insertion  2012  . Cataracts removed    . Cystoscopy with biopsy N/A 06/21/2013    Procedure: CYSTOSCOPY WITH BIOPSY;  Surgeon: Molli Hazard, MD;  Location: [facility];  Service: Urology;  Laterality: N/A;    TUR RESECTION OF POLYP BILATERAL RETROGRADE PYELOGRAM BLADDER BIOPSY    . Prostate biopsy N/A 06/21/2013    Procedure: BIOPSY TRANSRECTAL ULTRASONIC PROSTATE (TUBP);  Surgeon: Molli Hazard, MD;  Location: [facility];  Service: Urology;  Laterality: N/A;  PROSTATE NERVE BLOCK  . Appendectomy  1980  . Hip pinning,cannulated Right 06/30/2014    Procedure: CANNULATED HIP PINNING;  Surgeon: Renette Butters, MD;  Location: [facility];  Service: Orthopedics;  Laterality: Right;   Family History  Problem Relation Age of Onset  . Cancer Mother     breast  . Cancer Father     prostate, esophagus   Social History  Substance Use Topics  . Smoking status: Former Smoker -- 0.75 packs/day for 24 years    Types: Cigarettes    Quit date: 03/12/1968  . Smokeless tobacco: Former Systems developer    Types: Snuff, Chew    Quit date: 03/11/1968  . Alcohol Use: No    Review of Systems  Constitutional: Negative for fever, chills, diaphoresis, appetite change and fatigue.  HENT: Negative for mouth sores, sore throat and trouble swallowing.   Eyes: Negative for visual disturbance.  Respiratory: Positive for cough and shortness of breath. Negative for chest tightness and wheezing.   Cardiovascular: Positive for leg swelling. Negative for chest pain.  Gastrointestinal: Negative for nausea, vomiting, abdominal pain, diarrhea and abdominal distention.  Endocrine: Negative for polydipsia, polyphagia and polyuria.  Genitourinary: Negative for dysuria, frequency and hematuria.  Musculoskeletal: Negative for gait problem.  Skin: Negative for color change,  pallor and rash.  Neurological: Negative for dizziness, syncope, light-headedness and headaches.  Hematological: Does not bruise/bleed easily.  Psychiatric/Behavioral: Negative for behavioral problems and confusion.      Allergies  Crestor; Lipitor; Penicillins; Tramadol; and Vicodin  Home Medications   Prior to Admission medications   Medication Sig Start Date End Date Taking? Authorizing Provider  amiodarone (PACERONE) 200 MG tablet Take 100 mg by mouth every morning.    Yes Historical Provider, MD  atenolol (TENORMIN) 25 MG tablet Take 25 mg by mouth every morning.    Yes Historical Provider, MD  Calcium Carb-Cholecalciferol (CALTRATE 600+D3 SOFT PO) Take 2 tablets by mouth 2 (two) times daily.   Yes Historical Provider, MD  fluticasone (FLONASE) 50 MCG/ACT nasal spray Place 1 spray into both nostrils at bedtime. 12/26/14  Yes Rigoberto Noel, MD  folic acid (FOLVITE) A999333 MCG tablet Take 400 mcg by mouth daily.     Yes Historical Provider, MD  furosemide (LASIX) 40 MG tablet Take 1 tablet (40 mg total) by mouth daily. Patient taking differently: Take 160 mg by mouth daily.  07/13/14  Yes Ivan Anchors Love, PA-C  glimepiride (AMARYL) 1 MG tablet Take 1 tablet (1 mg total) by mouth daily before breakfast. 07/13/14  Yes Ivan Anchors Love, PA-C  Glucosamine HCl 1500 MG TABS Take 1,500 mg by mouth 2 (two) times daily.    Yes Historical Provider, MD  [place] 300 MG CAPS Take 1 capsule by mouth daily.    Yes Historical Provider, MD  Multiple Vitamin (MULTIVITAMIN WITH MINERALS) TABS tablet Take 1 tablet by mouth daily.   Yes Historical Provider, MD  Omega-3 Fatty Acids (FISH OIL) 1200 MG CAPS Take 1,200 mg by mouth daily.   Yes Historical Provider, MD  senna-docusate (SENOKOT-S) 8.6-50 MG per tablet Take 2 tablets by mouth 2 (two) times daily. 07/13/14  Yes Ivan Anchors Love, PA-C  tamsulosin (FLOMAX) 0.4 MG CAPS capsule Take 1 capsule by mouth daily.   Yes Historical Provider, MD  warfarin (COUMADIN) 4  MG tablet Take 2-4 mg by mouth daily. Take 2 mg on Friday only, and 4mg  on all other days   Yes Historical Provider, MD  Albuterol Sulfate (PROAIR RESPICLICK) [id number] (90 BASE) MCG/ACT AEPB Inhale 2 puffs into the lungs at bedtime. Patient not taking: Reported on 02/13/2015 12/01/14   Rigoberto Noel, MD  methocarbamol (ROBAXIN) 500 MG tablet Take 1 tablet (500 mg total) by mouth every 6 (six) hours as needed for muscle spasms. Patient not taking: Reported on 02/13/2015 07/13/14   Ivan Anchors Love, PA-C   BP 98/49 mmHg  Pulse 61  Temp(Src) 97.8 F (36.6 C) (Oral)  Resp 20  Ht 6\' 1"  (1.854 m)  Wt 229 lb 14.4 oz (104.282 kg)  BMI 30.34 kg/m2  SpO2 96% Physical Exam  Constitutional: He is oriented to person, place, and time. He appears well-developed and well-nourished. No distress.  HENT:  Head: Normocephalic.  Eyes: Conjunctivae are normal. Pupils are equal, round, and reactive to light. No scleral icterus.  Neck: Normal range of motion. Neck supple. No JVD present. No thyromegaly present.  Cardiovascular: Normal rate and regular rhythm.  Exam reveals no gallop and no friction rub.   No murmur heard. Pulmonary/Chest: Effort normal and breath sounds normal. No respiratory distress. He has no wheezes. He has no rales.  Globally diminished breath sounds. Bibasilar crackles. No increased work of breathing.  Abdominal: Soft. Bowel sounds are normal. He exhibits no distension. There is no tenderness. There is no rebound.  Musculoskeletal: Normal range of motion.  Neurological: He is alert and oriented to person, place, and time.  Skin: Skin is warm and dry. No rash noted.  Psychiatric: He has a normal mood and affect. His behavior is normal.    ED Course  Procedures (including critical care time) Labs Review Labs Reviewed  CBC WITH DIFFERENTIAL/PLATELET - Abnormal; Notable for the following:    RBC 3.06 (*)    Hemoglobin 9.2 (*)    HCT 30.4 (*)    RDW 17.7 (*)    All other components within  normal limits  BASIC METABOLIC PANEL - Abnormal; Notable for the following:    Glucose, Bld 102 (*)    BUN 62 (*)    Creatinine, Ser 3.37 (*)    GFR calc non Af Amer 15 (*)    GFR calc Af Amer 18 (*)    All other components within normal limits  TROPONIN  I - Abnormal; Notable for the following:    Troponin I 0.04 (*)    All other components within normal limits  BRAIN NATRIURETIC PEPTIDE - Abnormal; Notable for the following:    B Natriuretic Peptide 877.8 (*)    All other components within normal limits  PROTIME-INR - Abnormal; Notable for the following:    Prothrombin Time 26.2 (*)    INR 2.44 (*)    All other components within normal limits  COMPREHENSIVE METABOLIC PANEL - Abnormal; Notable for the following:    BUN 66 (*)    Creatinine, Ser 3.31 (*)    Total Protein 5.8 (*)    Albumin 3.1 (*)    GFR calc non Af Amer 16 (*)    GFR calc Af Amer 18 (*)    All other components within normal limits  BASIC METABOLIC PANEL - Abnormal; Notable for the following:    CO2 33 (*)    Glucose, Bld 126 (*)    BUN 65 (*)    Creatinine, Ser 3.45 (*)    Calcium 8.8 (*)    GFR calc non Af Amer 15 (*)    GFR calc Af Amer 17 (*)    All other components within normal limits  PROTIME-INR - Abnormal; Notable for the following:    Prothrombin Time 26.1 (*)    INR 2.43 (*)    All other components within normal limits  GLUCOSE, CAPILLARY - Abnormal; Notable for the following:    Glucose-Capillary 131 (*)    All other components within normal limits  GLUCOSE, CAPILLARY - Abnormal; Notable for the following:    Glucose-Capillary 127 (*)    All other components within normal limits  GLUCOSE, CAPILLARY - Abnormal; Notable for the following:    Glucose-Capillary 111 (*)    All other components within normal limits  VITAMIN B12 - Abnormal; Notable for the following:    Vitamin B-12 998 (*)    All other components within normal limits  IRON AND TIBC - Abnormal; Notable for the following:     Iron 33 (*)    Saturation Ratios 13 (*)    All other components within normal limits  GLUCOSE, CAPILLARY - Abnormal; Notable for the following:    Glucose-Capillary 102 (*)    All other components within normal limits  PROTIME-INR - Abnormal; Notable for the following:    Prothrombin Time 25.2 (*)    INR 2.31 (*)    All other components within normal limits  CBC - Abnormal; Notable for the following:    RBC 2.87 (*)    Hemoglobin 8.6 (*)    HCT 28.3 (*)    RDW 17.7 (*)    All other components within normal limits  RENAL FUNCTION PANEL - Abnormal; Notable for the following:    Chloride 98 (*)    BUN 78 (*)    Creatinine, Ser 3.57 (*)    Calcium 8.6 (*)    Phosphorus 5.0 (*)    Albumin 2.8 (*)    GFR calc non Af Amer 14 (*)    GFR calc Af Amer 16 (*)    All other components within normal limits  GLUCOSE, CAPILLARY - Abnormal; Notable for the following:    Glucose-Capillary 126 (*)    All other components within normal limits  GLUCOSE, CAPILLARY - Abnormal; Notable for the following:    Glucose-Capillary 121 (*)    All other components within normal limits  PROTIME-INR - Abnormal; Notable for the following:  Prothrombin Time 23.0 (*)    INR 2.06 (*)    All other components within normal limits  CBC - Abnormal; Notable for the following:    RBC 3.08 (*)    Hemoglobin 9.3 (*)    HCT 30.3 (*)    RDW 17.5 (*)    All other components within normal limits  RENAL FUNCTION PANEL - Abnormal; Notable for the following:    Chloride 95 (*)    CO2 35 (*)    BUN 84 (*)    Creatinine, Ser 3.88 (*)    Calcium 8.8 (*)    Phosphorus 5.5 (*)    Albumin 2.8 (*)    GFR calc non Af Amer 13 (*)    GFR calc Af Amer 15 (*)    All other components within normal limits  GLUCOSE, CAPILLARY - Abnormal; Notable for the following:    Glucose-Capillary 130 (*)    All other components within normal limits  MAGNESIUM  GLUCOSE, CAPILLARY  FERRITIN  GLUCOSE, CAPILLARY  GLUCOSE, CAPILLARY   FOLATE RBC    Imaging Review No results found. I have personally reviewed and evaluated these images and lab results as part of my medical decision-making.   EKG Interpretation   Date/Time:  Thursday February 23 2015 15:17:30 EST Ventricular Rate:  72 PR Interval:    QRS Duration: 181 QT Interval:  485 QTC Calculation: 531 R Axis:   -60 Text Interpretation:  Accelerated junctional rhythm Nonspecific IVCD with  LAD LVH with secondary repolarization abnormality Inferior infarct, acute  (RCA) Anterior infarct, old Lateral leads are also involved Probable RV  involvement, suggest recording right precordial leads ED PHYSICIAN  INTERPRETATION AVAILABLE IN [place] Confirmed by TEST, Record  ([id number]) on 02/24/2015 6:48:18 AM      MDM   Final diagnoses:  Congestive heart failure, unspecified congestive heart failure chronicity, unspecified congestive heart failure type ([place])   Seen by Dr. Wynonia Lawman.  Will be admitted.    Tanna Furry, MD 02/26/15 [phone]

## 2015-02-23 NOTE — Consult Note (Addendum)
ANTICOAGULATION CONSULT NOTE - Initial Consult  Pharmacy Consult for Coumadin Indication: atrial fibrillation  Allergies  Allergen Reactions  . Crestor [Rosuvastatin] Other (See Comments)    Aches in joint   . Lipitor [Atorvastatin] Other (See Comments)    Joint aching  . Penicillins Rash  . Tramadol Nausea Only  . Vicodin [Hydrocodone-Acetaminophen] Anxiety    "Made me nervous"    Patient Measurements: Weight: 250 lb 7 oz (113.598 kg)  Vital Signs: Temp: 98.4 F (36.9 C) (12/15 1038) Temp Source: Oral (12/15 1038) BP: 125/87 mmHg (12/15 1400) Pulse Rate: 66 (12/15 1400)  Labs:  Recent Labs  02/23/15 1135  HGB 9.2*  HCT 30.4*  PLT 256  LABPROT 26.2*  INR 2.44*  CREATININE 3.37*  TROPONINI 0.04*    Estimated Creatinine Clearance: 21.1 mL/min (by C-G formula based on Cr of 3.37).   Medical History: Past Medical History  Diagnosis Date  . Lumbar disc disease   . BPH (benign prostatic hyperplasia)   . HTN (hypertension)   . Sinus node dysfunction (HCC)     symptomatic bradycardia  . Pacemaker     Medtronic-dual-chamber-DOI 2012  . CAD (coronary artery disease)     followed by Dr. Wynonia Lawman  . History of skin cancer   . Chronic kidney disease     kidneys function at "20 %" followed by Dr. Mercy Moore  . Prostate cancer ([place])   . Lumbar disc disease 1992  . Atrial fibrillation ([place]) 08/07/2011    CHADS score  2 CHADS2 VASC score 4  Onset winter of 2013 Cardioversion May 2013 Reversion to a fib 3/14 amiodarone begun 07/02/12  Repeat cardioversion Jul 16, 2012    . CAD (coronary artery disease) 03/06/2010    CABG with LIMA to LAD, SVG to OM-OM2  05/05/01 Dr. Roxan Hockey  Cardiac Cath 12/2009 60% left main, occluded LAD, Widely patent SVG to OM1 and 2, patent LIMA, widely patent RCA    . Cardiac pacemaker in situ     07/26/10  Inserted for chronotropic incompetence and dyspnea  RA lead  Medtronic 5076  serial number [id number]   RV lead  Medtronic 5076  serial number  [id number] Medtronic Adapta RL pulse generator, SN  [id number]    . Hypertensive heart disease without CHF    Assessment: 88yom on coumadin pta for afib, being admitted with SOB, weight gain, and leg swelling 2/2 acute heart failure. Coumadin to continue. INR on admission therapeutic at 2.44. He was taking amiodarone 100mg  daily pta but dose has been increased to 200mg  daily so will need to watch for drug interaction.  Home dose: 4mg  daily except 2mg  on Friday - last dose 12/14  Goal of Therapy:  INR 2-3 Monitor platelets by anticoagulation protocol: Yes   Plan:  1) Coumadin 4mg  x 1 2) Daily INR  Deboraha Sprang 02/23/2015,2:53 PM

## 2015-02-23 NOTE — ED Notes (Signed)
Pt arrives from home with c/o SOB. Pt states he has been coughing frequently and has had a significant weight gain recently and his PCP has advised that he increase his lasix.

## 2015-02-23 NOTE — Consult Note (Signed)
Christopher [name] Admit Date: 02/23/2015 02/23/2015 Christopher [name] Requesting Physician:  Christopher Coyer MD  Reason for Consult:  CKD4, Hypervolemia, Diuresis Management HPI:  [phone] seen at the request of Dr. Wynonia [name] for the evaluation of the above issues. Patient follows with Dr. Mercy [name] in our office. He has CKD 4 with a baseline serum creatinine between 3 and 3.5. He was last seen by Dr. Mercy [name] in last seen by Dr. Mercy [name] in October of this year.  Tentative plan should he progress to ESRD would be an center hemodialysis. He has not yet required vascular access. Pertinent history includes chronic diastolic heart failure, atherosclerotic disease with history of CABG, permanent pacemaker, with history of cardioversion illation, question of amiodarone lung toxicity,   He has had progressive weight gain, cough, fatigue. He has worked with Dr. Wynonia [name] as an outpatient to address his decompensated heart failure but has not improved with increasing dosing of diuretics. he came to the emergency room today because of worsening symptoms of dyspnea and cough. He is 20 pounds above his baseline weight and 7 pounds in the more recent weeks. He has progressive edema in the legs. He is in atrial fibrillation. He has been placed on IV furosemide with metolazone.    His current GFR is stable compared to previous values.    CREATININE, SER (mg/dL)  Date Value  02/23/2015 3.37*  02/20/2015 3.37*  07/07/2014 2.65*  07/05/2014 3.05*  07/04/2014 3.72*  07/03/2014 3.72*  07/02/2014 3.63*  07/01/2014 3.14*  06/30/2014 2.98*  06/29/2014 3.37*  ] I/Os:  ROS NSAIDS: none IV Contrast none TMP/SMX none Hypotension none Balance of 12 systems is negative w/ exceptions as above  PMH  Past Medical History  Diagnosis Date  . Lumbar disc disease   . BPH (benign prostatic hyperplasia)   . HTN (hypertension)   . Sinus node dysfunction (HCC)     symptomatic bradycardia  . Pacemaker     Medtronic-dual-chamber-DOI  2012  . CAD (coronary artery disease)     followed by Dr. Wynonia [name]  . History of skin cancer   . Chronic kidney disease     kidneys function at "20 %" followed by Dr. Mercy [name]  . Prostate cancer ([place])   . Lumbar disc disease 1992  . Atrial fibrillation ([place]) 08/07/2011    CHADS score  2 CHADS2 VASC score 4  Onset winter of 2013 Cardioversion May 2013 Reversion to a fib 3/14 amiodarone begun 07/02/12  Repeat cardioversion Jul 16, 2012    . CAD (coronary artery disease) 03/06/2010    CABG with LIMA to LAD, SVG to OM-OM2  05/05/01 Dr. Roxan [name]  Cardiac Cath 12/2009 60% left main, occluded LAD, Widely patent SVG to OM1 and 2, patent LIMA, widely patent RCA    . Cardiac pacemaker in situ     07/26/10  Inserted for chronotropic incompetence and dyspnea  RA lead  Medtronic 5076  serial number [id number]   RV lead  Medtronic 5076  serial number [id number] Medtronic Adapta RL pulse generator, SN  [id number]    . Hypertensive heart disease without CHF    PSH  Past Surgical History  Procedure Laterality Date  . Coronary artery bypass graft  2003    x3  . Cardiac catheterization    . Lumbar laminectomy  11/01  . Cardioversion  08/08/2011    Procedure: CARDIOVERSION;  Surgeon: Christopher Reedy, MD;  Location: [place];  Service: Cardiovascular;  Laterality: N/A;  . Cardioversion N/A 07/16/2012    Procedure:  CARDIOVERSION;  Surgeon: Christopher Reedy, MD;  Location: [place];  Service: Cardiovascular;  Laterality: N/A;  . Pacemaker insertion  2012  . Cataracts removed    . Cystoscopy with biopsy N/A 06/21/2013    Procedure: CYSTOSCOPY WITH BIOPSY;  Surgeon: Christopher Hazard, MD;  Location: [facility];  Service: Urology;  Laterality: N/A;    TUR RESECTION OF POLYP BILATERAL RETROGRADE PYELOGRAM BLADDER BIOPSY    . Prostate biopsy N/A 06/21/2013    Procedure: BIOPSY TRANSRECTAL ULTRASONIC PROSTATE (TUBP);  Surgeon: Christopher Hazard, MD;  Location: [facility];  Service: Urology;  Laterality: N/A;   PROSTATE NERVE BLOCK  . Appendectomy  1980  . Hip pinning,cannulated Right 06/30/2014    Procedure: CANNULATED HIP PINNING;  Surgeon: Christopher Butters, MD;  Location: [facility];  Service: Orthopedics;  Laterality: Right;   FH  Family History  Problem Relation Age of Onset  . Cancer Mother     breast  . Cancer Father     prostate, esophagus   SH  reports that he quit smoking about 46 years ago. His smoking use included Cigarettes. He has a 18 pack-year smoking history. He quit smokeless tobacco use about 46 years ago. His smokeless tobacco use included Snuff and Chew. He reports that he does not drink alcohol or use illicit drugs. Allergies  Allergies  Allergen Reactions  . Crestor [Rosuvastatin] Other (See Comments)    Aches in joint   . Lipitor [Atorvastatin] Other (See Comments)    Joint aching  . Penicillins Rash  . Tramadol Nausea Only  . Vicodin [Hydrocodone-Acetaminophen] Anxiety    "Made me nervous"   Home medications Prior to Admission medications   Medication Sig Start Date End Date Taking? Authorizing Provider  amiodarone (PACERONE) 200 MG tablet Take 100 mg by mouth every morning.    Yes Historical Provider, MD  atenolol (TENORMIN) 25 MG tablet Take 25 mg by mouth every morning.    Yes Historical Provider, MD  Calcium Carb-Cholecalciferol (CALTRATE 600+D3 SOFT PO) Take 2 tablets by mouth 2 (two) times daily.   Yes Historical Provider, MD  fluticasone (FLONASE) 50 MCG/ACT nasal spray Place 1 spray into both nostrils at bedtime. 12/26/14  Yes Christopher Noel, MD  folic acid (FOLVITE) A999333 MCG tablet Take 400 mcg by mouth daily.     Yes Historical Provider, MD  furosemide (LASIX) 40 MG tablet Take 1 tablet (40 mg total) by mouth daily. Patient taking differently: Take 160 mg by mouth daily.  07/13/14  Yes Christopher Anchors Love, PA-C  glimepiride (AMARYL) 1 MG tablet Take 1 tablet (1 mg total) by mouth daily before breakfast. 07/13/14  Yes Christopher Anchors Love, PA-C  Glucosamine HCl 1500 MG TABS  Take 1,500 mg by mouth 2 (two) times daily.    Yes Historical Provider, MD  [place] Chestnut 300 MG CAPS Take 1 capsule by mouth daily.    Yes Historical Provider, MD  Multiple Vitamin (MULTIVITAMIN WITH MINERALS) TABS tablet Take 1 tablet by mouth daily.   Yes Historical Provider, MD  Omega-3 Fatty Acids (FISH OIL) 1200 MG CAPS Take 1,200 mg by mouth daily.   Yes Historical Provider, MD  senna-docusate (SENOKOT-S) 8.6-50 MG per tablet Take 2 tablets by mouth 2 (two) times daily. 07/13/14  Yes Christopher Anchors Love, PA-C  tamsulosin (FLOMAX) 0.4 MG CAPS capsule Take 1 capsule by mouth daily.   Yes Historical Provider, MD  warfarin (COUMADIN) 4 MG tablet Take 2-4 mg by mouth daily. Take 2 mg  on Friday only, and 4mg  on all other days   Yes Historical Provider, MD  Albuterol Sulfate (PROAIR RESPICLICK) 123XX123 (90 BASE) MCG/ACT AEPB Inhale 2 puffs into the lungs at bedtime. Patient not taking: Reported on 02/13/2015 12/01/14   Christopher Noel, MD  methocarbamol (ROBAXIN) 500 MG tablet Take 1 tablet (500 mg total) by mouth every 6 (six) hours as needed for muscle spasms. Patient not taking: Reported on 02/13/2015 07/13/14   Christopher Anchors Love, PA-C    Current Medications Scheduled Meds: . warfarin  4 mg Oral ONCE-1800  . Warfarin - Pharmacist Dosing Inpatient   Does not apply q1800   Continuous Infusions:  PRN Meds:.  CBC  Recent Labs Lab 02/20/15 0923 02/23/15 1135  WBC 6.8 7.0  NEUTROABS 5.7 5.7  HGB 9.2* 9.2*  HCT 30.6* 30.4*  MCV 98.4 99.3  PLT 292 123456   Basic Metabolic Panel  Recent Labs Lab 02/20/15 0923 02/23/15 1135  NA 143 143  K 4.4 3.9  CL 109 103  CO2 27 30  GLUCOSE 117* 102*  BUN 65* 62*  CREATININE 3.37* 3.37*  CALCIUM 9.0 9.4    Physical Exam  Blood pressure 118/68, pulse 61, temperature 98.4 F (36.9 C), temperature source Oral, resp. rate 27, weight 113.598 kg (250 lb 7 oz), SpO2 98 %. GEN: obese elderly male NAD ENT: NCAT EYES: EOMI CV: Irregular rhythm, normal rate, no  rub PULM: ctab ABD: obese, protuberant SKIN: no rashes/lesions EXT:2+ LEE   Assessment  1. A/C Diastolic HF exacerbation 2. Hypervolemia 3. Cough 4. CKD4, at baseline, nonproteinuric, CKA [place] follows 5. CAD hx/o CABG 6. PPM 7. HTN 8. Atrial Fibrillation 9. On Anticoagulation  Plan 1. Agree with current diuretic dosing Daily weights, Daily Renal Panel, Strict I/Os, Avoid nephrotoxins (NSAIDs, judicious IV Contrast) Will follow along daily  Christopher Grippe MD [phone] pgr 02/23/2015, 4:20 PM

## 2015-02-23 NOTE — ED Notes (Signed)
MD at bedside. 

## 2015-02-23 NOTE — ED Notes (Signed)
Charge RN requested to interrogate pt medtronic pacer.

## 2015-02-23 NOTE — H&P (Signed)
History and Physical   Admit date: 02/23/2015 Name:  Christopher [name] Medical record number: [id number] DOB/Age:  1926/04/19  81 y.o. male  Referring Physician:   [facility]   Primary Cardiologist:  Wynonia Lawman  Primary Physician:   Av[facility]  Chief complaint/reason for admission: Shortness of breath  HPI:  This 81 year old male has a history of hypertensive heart disease coronary artery disease with previous bypass grafting and permanent pacemaker implanted for chronotropic incompetence in the past.  He has chronic diastolic heart failure as well as stage IV chronic kidney disease.  He has recently had some difficulties with a hip fracture as well as severe low back pain.  He has had deterioration in his renal function.  He has had a 20 pound weight gain in the past few months and over the past 6 weeks is had a 7 pound weight gain.  He went to the emergency room earlier this week and had increasing shortness of breath and was given a shot of Lasix and sent home.  He saw me in the office yesterday and was noted to have gone into atrial fibrillation by pacemaker interrogation on approximately December 12.  He has a prior history of atrial fibrillation and has been cardioverted twice previously.  He has been on chronic amiodarone therapy but developed a cough earlier this year and because of the cough is amiodarone dosage was reduced to 100 mg daily.  He has continued to cough.  When seen in the office yesterday he was given 160 mg of Lasix to take by mouth with increasing his dose but despite this had set continued shortness of breath and cough and was miserable and came to the emergency room today.  He is admitted at this time for intravenous diuresis and renal consultation.  He also may need cardioversion.   Past Medical History  Diagnosis Date  . Lumbar disc disease   . BPH (benign prostatic hyperplasia)   . HTN (hypertension)   . Sinus node dysfunction (HCC)     symptomatic bradycardia  .  Pacemaker     Medtronic-dual-chamber-DOI 2012  . CAD (coronary artery disease)     followed by Dr. Wynonia Lawman  . History of skin cancer   . Chronic kidney disease     kidneys function at "20 %" followed by Dr. Mercy Moore  . Prostate cancer ([place])   . Lumbar disc disease 1992  . Atrial fibrillation ([place]) 08/07/2011    CHADS score  2 CHADS2 VASC score 4  Onset winter of 2013 Cardioversion May 2013 Reversion to a fib 3/14 amiodarone begun 07/02/12  Repeat cardioversion Jul 16, 2012    . CAD (coronary artery disease) 03/06/2010    CABG with LIMA to LAD, SVG to OM-OM2  05/05/01 Dr. Roxan Hockey  Cardiac Cath 12/2009 60% left main, occluded LAD, Widely patent SVG to OM1 and 2, patent LIMA, widely patent RCA    . Cardiac pacemaker in situ     07/26/10  Inserted for chronotropic incompetence and dyspnea  RA lead  Medtronic 5076  serial number [id number]   RV lead  Medtronic 5076  serial number [id number] Medtronic Adapta RL pulse generator, SN  [id number]    . Hypertensive heart disease without CHF      Past Surgical History  Procedure Laterality Date  . Coronary artery bypass graft  2003    x3  . Cardiac catheterization    . Lumbar laminectomy  11/01  . Cardioversion  08/08/2011    Procedure:  CARDIOVERSION;  Surgeon: Jacolyn Reedy, MD;  Location: [place];  Service: Cardiovascular;  Laterality: N/A;  . Cardioversion N/A 07/16/2012    Procedure: CARDIOVERSION;  Surgeon: Jacolyn Reedy, MD;  Location: [place];  Service: Cardiovascular;  Laterality: N/A;  . Pacemaker insertion  2012  . Cataracts removed    . Cystoscopy with biopsy N/A 06/21/2013    Procedure: CYSTOSCOPY WITH BIOPSY;  Surgeon: Molli Hazard, MD;  [facility];  Service: Urology;  Laterality: N/A;    TUR RESECTION OF POLYP BILATERAL RETROGRADE PYELOGRAM BLADDER BIOPSY    . Prostate biopsy N/A 06/21/2013    Procedure: BIOPSY TRANSRECTAL ULTRASONIC PROSTATE (TUBP);  Surgeon: Molli Hazard, MD;  [facility];   Service: Urology;  Laterality: N/A;  PROSTATE NERVE BLOCK  . Appendectomy  1980  . Hip pinning,cannulated Right 06/30/2014    Procedure: CANNULATED HIP PINNING;  Surgeon: Renette Butters, MD;  Location: [place];  Service: Orthopedics;  Laterality: Right;   Allergies: is allergic to crestor; lipitor; penicillins; tramadol; and vicodin.   Medications: Prior to Admission medications   Medication Sig Start Date End Date Taking? Authorizing Provider  amiodarone (PACERONE) 200 MG tablet Take 100 mg by mouth every morning.    Yes Historical Provider, MD  atenolol (TENORMIN) 25 MG tablet Take 25 mg by mouth every morning.    Yes Historical Provider, MD  Calcium Carb-Cholecalciferol (CALTRATE 600+D3 SOFT PO) Take 2 tablets by mouth 2 (two) times daily.   Yes Historical Provider, MD  fluticasone (FLONASE) 50 MCG/ACT nasal spray Place 1 spray into both nostrils at bedtime. 12/26/14  Yes Rigoberto Noel, MD  folic acid (FOLVITE) A999333 MCG tablet Take 400 mcg by mouth daily.     Yes Historical Provider, MD  furosemide (LASIX) 40 MG tablet Take 1 tablet (40 mg total) by mouth daily. Patient taking differently: Take 160 mg by mouth daily.  07/13/14  Yes Ivan Anchors Love, PA-C  glimepiride (AMARYL) 1 MG tablet Take 1 tablet (1 mg total) by mouth daily before breakfast. 07/13/14  Yes Ivan Anchors Love, PA-C  Glucosamine HCl 1500 MG TABS Take 1,500 mg by mouth 2 (two) times daily.    Yes Historical Provider, MD  H[place] Chestnut 300 MG CAPS Take 1 capsule by mouth daily.    Yes Historical Provider, MD  Multiple Vitamin (MULTIVITAMIN WITH MINERALS) TABS tablet Take 1 tablet by mouth daily.   Yes Historical Provider, MD  Omega-3 Fatty Acids (FISH OIL) 1200 MG CAPS Take 1,200 mg by mouth daily.   Yes Historical Provider, MD  senna-docusate (SENOKOT-S) 8.6-50 MG per tablet Take 2 tablets by mouth 2 (two) times daily. 07/13/14  Yes Ivan Anchors Love, PA-C  tamsulosin (FLOMAX) 0.4 MG CAPS capsule Take 1 capsule by mouth daily.   Yes  Historical Provider, MD  warfarin (COUMADIN) 4 MG tablet Take 2-4 mg by mouth daily. Take 2 mg on Friday only, and 4mg  on all other days   Yes Historical Provider, MD  Albuterol Sulfate (PROAIR RESPICLICK) [id number] (90 BASE) MCG/ACT AEPB Inhale 2 puffs into the lungs at bedtime. Patient not taking: Reported on 02/13/2015 12/01/14   Rigoberto Noel, MD  methocarbamol (ROBAXIN) 500 MG tablet Take 1 tablet (500 mg total) by mouth every 6 (six) hours as needed for muscle spasms. Patient not taking: Reported on 02/13/2015 07/13/14   Bary Leriche, PA-C   Family History:  Family Status  Relation Status Death Age  . Mother Deceased 41  .  Father Deceased 14  . Sister Alive   . Brother Alive    Social History:   reports that he quit smoking about 46 years ago. His smoking use included Cigarettes. He has a 18 pack-year smoking history. He quit smokeless tobacco use about 46 years ago. His smokeless tobacco use included Snuff and Chew. He reports that he does not drink alcohol or use illicit drugs.   Social History   Social History Narrative     Review of Systems: Other than as noted above, the remainder of the review of systems is normal  Physical Exam: BP 115/64 mmHg  Pulse 59  Temp(Src) 98.4 F (36.9 C) (Oral)  Resp 26  Wt 113.598 kg (250 lb 7 oz)  SpO2 99% General appearance: He is an elderly male obese in mild respiratory distress Head: Normocephalic, without obvious abnormality, atraumatic Eyes: conjunctivae/corneas clear. PERRL, EOM's intact. Fundi benign. Neck: no adenopathy, no carotid bruit, supple, symmetrical, trachea midline and JVD appears elevated Lungs: Basilar rales as well as reduced breath sounds Heart: Irregular rhythm, normal S1 and S2, no S3 Abdomen: soft, non-tender; bowel sounds normal; no masses,  no organomegaly Rectal: deferred Extremities: 2+ peripheral edema, previous saphenous vein harvesting scar noted no deformity Pulses: 2+ and symmetric Skin: Skin color,  texture, turgor normal. No rashes or lesions Neurologic: Grossly normal  Labs: CBC  Recent Labs  02/23/15 1135  WBC 7.0  RBC 3.06*  HGB 9.2*  HCT 30.4*  PLT 256  MCV 99.3  MCH 30.1  MCHC 30.3  RDW 17.7*  LYMPHSABS 0.9  MONOABS 0.4  EOSABS 0.0  BASOSABS 0.0   CMP   Recent Labs  02/23/15 1135  NA 143  K 3.9  CL 103  CO2 30  GLUCOSE 102*  BUN 62*  CREATININE 3.37*  CALCIUM 9.4  GFRNONAA 15*  GFRAA 18*   BNP (last 3 results) No results for input(s): PROBNP in the last 8760 hours. Cardiac Panel (last 3 results)  Recent Labs  02/23/15 1135  TROPONINI 0.04*   EKG: Atrial fibrillation, right bundle branch block, nonspecific ST changes  Radiology: Pulmonary vascular congestion, infiltrate in the left lung base   IMPRESSIONS: 1.  Acute on chronic diastolic heart failure likely worsened because of worsening renal function as well as interval development of atrial fibrillation Exline 2.  Paroxysmal atrial fibrillation with recurrence 3.  Persistent cough questionable of amiodarone toxicity 4.  Obesity 5.  Long-term anticoagulation with warfarin 6.  Severe chronic back pain 7.  Stage 4-5 chronic kidney disease  PLAN: The patient will be given intravenous furosemide and I will add Zaroxolyn to help with diuresis.  His rate is fairly well controlled now.  Likely will need cardioversion while he is in the hospital but would like to diurese him prior to that.  Renal consultation.  He may be somewhat volume overloaded due to his renal function.  Recent echocardiogram was just done one month ago and shows concentric LVH with preserved LV systolic function with an estimated EF of 60%.  The amiodarone may be causing issues with cough and he may need to have this discontinued all the likelihood of maintaining sinus rhythm in the long-term would be helpful following that.  I also discussed with the family that it may be difficult to maintain sinus rhythm  anyway.  Signed: Kerry Hough MD [facility]  02/23/2015, 1:40 PM

## 2015-02-23 NOTE — ED Notes (Signed)
X RAY at bedside 

## 2015-02-24 LAB — VITAMIN B12: VITAMIN B 12: 998 pg/mL — AB (ref 180–914)

## 2015-02-24 LAB — BASIC METABOLIC PANEL
ANION GAP: 9 (ref 5–15)
BUN: 65 mg/dL — ABNORMAL HIGH (ref 6–20)
CO2: 33 mmol/L — ABNORMAL HIGH (ref 22–32)
Calcium: 8.8 mg/dL — ABNORMAL LOW (ref 8.9–10.3)
Chloride: 101 mmol/L (ref 101–111)
Creatinine, Ser: 3.45 mg/dL — ABNORMAL HIGH (ref 0.61–1.24)
GFR, EST AFRICAN AMERICAN: 17 mL/min — AB (ref >60)
GFR, EST NON AFRICAN AMERICAN: 15 mL/min — AB (ref >60)
Glucose, Bld: 126 mg/dL — ABNORMAL HIGH (ref 65–99)
POTASSIUM: 4.1 mmol/L (ref 3.5–5.1)
SODIUM: 143 mmol/L (ref 135–145)

## 2015-02-24 LAB — IRON AND TIBC
Iron: 33 ug/dL — ABNORMAL LOW (ref 45–182)
SATURATION RATIOS: 13 % — AB (ref 17.9–39.5)
TIBC: 252 ug/dL (ref 250–450)
UIBC: 219 ug/dL

## 2015-02-24 LAB — FERRITIN: Ferritin: 288 ng/mL (ref 24–336)

## 2015-02-24 LAB — GLUCOSE, CAPILLARY
GLUCOSE-CAPILLARY: 127 mg/dL — AB (ref 65–99)
Glucose-Capillary: 111 mg/dL — ABNORMAL HIGH (ref 65–99)
Glucose-Capillary: 102 mg/dL — ABNORMAL HIGH (ref 65–99)

## 2015-02-24 LAB — PROTIME-INR
INR: 2.43 — ABNORMAL HIGH (ref 0.00–1.49)
PROTHROMBIN TIME: 26.1 s — AB (ref 11.6–15.2)

## 2015-02-24 MED ORDER — WARFARIN SODIUM 2 MG PO TABS
2.0000 mg | ORAL_TABLET | Freq: Once | ORAL | Status: AC
  Administered 2015-02-24: 2 mg via ORAL
  Filled 2015-02-24: qty 1

## 2015-02-24 NOTE — Progress Notes (Signed)
ANTICOAGULATION CONSULT NOTE - Follow Up Consult  Pharmacy Consult for warfarin Indication: atrial fibrillation  Allergies  Allergen Reactions  . Crestor [Rosuvastatin] Other (See Comments)    Aches in joint   . Lipitor [Atorvastatin] Other (See Comments)    Joint aching  . Penicillins Rash  . Tramadol Nausea Only  . Vicodin [Hydrocodone-Acetaminophen] Anxiety    "Made me nervous"    Patient Measurements: Height: 6\' 1"  (185.4 cm) Weight: 234 lb (106.142 kg) IBW/kg (Calculated) : 79.9  Vital Signs: Temp: 98.7 F (37.1 C) (12/16 0516) Temp Source: Oral (12/16 0516) BP: 98/42 mmHg (12/16 0636) Pulse Rate: 61 (12/16 0516)  Labs:  Recent Labs  02/23/15 1135 02/23/15 1724 02/24/15 0631  HGB 9.2*  --   --   HCT 30.4*  --   --   PLT 256  --   --   LABPROT 26.2*  --  26.1*  INR 2.44*  --  2.43*  CREATININE 3.37* 3.31* 3.45*  TROPONINI 0.04*  --   --     Estimated Creatinine Clearance: 19.7 mL/min (by C-G formula based on Cr of 3.45).  Assessment:  88yom on coumadin pta for afib, being admitted with SOB, weight gain, and leg swelling 2/2 acute heart failure. Coumadin to continue. INR on admission therapeutic at 2.44. He was taking amiodarone 100mg  daily pta but dose has been increased to 200mg  daily so will need to watch for drug interaction.  Home dose: 4mg  daily except 2mg  on Friday  Current INR 2.43, hgb 9,2, plts 256  Goal of Therapy:  INR 2-3 Monitor platelets by anticoagulation protocol: Yes   Plan:  Warfarin 2 mg x 1 tonight Daily INR, CBC Monitor for S&S of bleed  Angela Burke, PharmD Pharmacy Resident Pager: [phone] 02/24/2015,9:42 AM

## 2015-02-24 NOTE — Progress Notes (Signed)
Patient ID: Christopher [name], male   DOB: Sep 13, 1926, 81 y.o.   MRN: [id number] S:feels better this morning O:BP 98/42 mmHg  Pulse 61  Temp(Src) 98.7 F (37.1 C) (Oral)  Resp 20  Ht 6\' 1"  (1.854 m)  Wt 106.142 kg (234 lb)  BMI 30.88 kg/m2  SpO2 94%  Intake/Output Summary (Last 24 hours) at 02/24/15 0857 Last data filed at 02/24/15 0835  Gross per 24 hour  Intake    240 ml  Output   1050 ml  Net   -810 ml   Intake/Output: I/O last 3 completed shifts: In: -  Out: 1050 [Urine:1050]  Intake/Output this shift:  Total I/O In: 240 [P.O.:240] Out: -  Weight change:  Gen:WD obese WM in NAd CVS:no rub Resp:occ end-expiratory wheezes bilaterally Abd:+BS, soft, NT Ext:+1 pretib edema   Recent Labs Lab 02/20/15 0923 02/23/15 1135 02/23/15 1724 02/24/15 0631  NA 143 143 142 143  K 4.4 3.9 3.7 4.1  CL 109 103 105 101  CO2 27 30 29  33*  GLUCOSE 117* 102* 98 126*  BUN 65* 62* 66* 65*  CREATININE 3.37* 3.37* 3.31* 3.45*  ALBUMIN  --   --  3.1*  --   CALCIUM 9.0 9.4 9.0 8.8*  AST  --   --  34  --   ALT  --   --  36  --    Liver Function Tests:  Recent Labs Lab 02/23/15 1724  AST 34  ALT 36  ALKPHOS 49  BILITOT 0.8  PROT 5.8*  ALBUMIN 3.1*   No results for input(s): LIPASE, AMYLASE in the last 168 hours. No results for input(s): AMMONIA in the last 168 hours. CBC:  Recent Labs Lab 02/20/15 0923 02/23/15 1135  WBC 6.8 7.0  NEUTROABS 5.7 5.7  HGB 9.2* 9.2*  HCT 30.6* 30.4*  MCV 98.4 99.3  PLT 292 256   Cardiac Enzymes:  Recent Labs Lab 02/20/15 0923 02/23/15 1135  TROPONINI <0.03 0.04*   CBG:  Recent Labs Lab 02/23/15 1707 02/23/15 2058 02/24/15 0613  GLUCAP 87 131* 127*    Iron Studies: No results for input(s): IRON, TIBC, TRANSFERRIN, FERRITIN in the last 72 hours. Studies/Results: Ct Hip Right Wo Contrast  02/22/2015  CLINICAL DATA:  Closed neck fracture of the right femur. EXAM: CT OF THE RIGHT HIP WITHOUT CONTRAST TECHNIQUE:  Multidetector CT imaging of the right hip was performed according to the standard protocol. Multiplanar CT image reconstructions were also generated. COMPARISON:  06/30/2014 and 06/30/2013 FINDINGS: Three lag screws traverse the left femoral neck fracture and extend into the femoral head. Although these are backed out up to 1 cm, they still lag the fracture site satisfactorily. There is nonunion along most of the fracture site, with cortication along the fracture plane. Anteriorly such cortication is less visible but there is little if any bridging bony anteriorly. We happen to include the entire bony pelvis and today's exam. There is bridging spurring of the right sacroiliac joint without evidence of osseous metastatic disease in the visualized bony structures. Aortoiliac atherosclerotic vascular disease. No pathologic pelvic adenopathy noted. Scattered diverticula of the sigmoid colon. Trace free pelvic fluid, etiology uncertain. There is evidence of lower lumbar spondylosis and degenerative disc disease especially at L5-S1. On the right side this causes moderate right foraminal stenosis at L5-S1 and mild right foraminal stenosis at L4-5. There is grade 1 degenerative anterolisthesis at L4-5. IMPRESSION: 1. 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 Anteriorly  decortication is less visible which may indicate incomplete nonunion but I do not observe significant bony bridging. 2. The 3 lag screws still lag the fracture site successfully, although the bottom 2 screws seem to have backed out a few mm. 3. Lower lumbar spondylosis and degenerative disc disease. On the right side this is causing foraminal impingement at L4-5 and L5-S1. 4. Scattered sigmoid colon diverticula. 5. Trace free pelvic fluid, etiology uncertain. 6. No findings of osseous or nodal metastatic disease in the visualized pelvis. Electronically Signed   By: Van Clines M.D.   On: 02/22/2015 15:46   Dg  Chest Port 1 View  02/23/2015  CLINICAL DATA:  Shortness of breath and cough for 2 days, history CHF, hypertension, pacemaker, type II diabetes mellitus, coronary artery disease, prostate cancer EXAM: PORTABLE CHEST 1 VIEW COMPARISON:  Portable exam 1135 hours compared to 02/20/2015 FINDINGS: LEFT subclavian transvenous pacemaker leads project over RIGHT atrium and RIGHT ventricle, unchanged. Enlargement of cardiac silhouette post CABG. Pulmonary vascular congestion. Bibasilar scarring. Minimal perihilar interstitial infiltrate at LEFT lung could represent asymmetric edema or infection. Remaining lungs clear. No pleural effusion or pneumothorax. IMPRESSION: Enlargement of cardiac silhouette post CABG and pacemaker. Perihilar infiltrate particularly on LEFT, question asymmetric edema versus infection. Electronically Signed   By: Lavonia Dana M.D.   On: 02/23/2015 12:14   . amiodarone  200 mg Oral Daily  . atenolol  25 mg Oral q morning - 10a  . fluticasone  1 spray Each Nare QHS  . furosemide  80 mg Intravenous Q6H  . glimepiride  1 mg Oral QAC breakfast  . metolazone  2.5 mg Oral BID  . senna-docusate  2 tablet Oral BID  . sodium chloride  3 mL Intravenous Q12H  . tamsulosin  0.4 mg Oral Daily  . Warfarin - Pharmacist Dosing Inpatient   Does not apply q1800    BMET    Component Value Date/Time   NA 143 02/24/2015 0631   K 4.1 02/24/2015 0631   CL 101 02/24/2015 0631   CO2 33* 02/24/2015 0631   GLUCOSE 126* 02/24/2015 0631   BUN 65* 02/24/2015 0631   CREATININE 3.45* 02/24/2015 0631   CALCIUM 8.8* 02/24/2015 0631   GFRNONAA 15* 02/24/2015 0631   GFRAA 17* 02/24/2015 0631   CBC    Component Value Date/Time   WBC 7.0 02/23/2015 1135   RBC 3.06* 02/23/2015 1135   HGB 9.2* 02/23/2015 1135   HCT 30.4* 02/23/2015 1135   PLT 256 02/23/2015 1135   MCV 99.3 02/23/2015 1135   MCH 30.1 02/23/2015 1135   MCHC 30.3 02/23/2015 1135   RDW 17.7* 02/23/2015 1135   LYMPHSABS 0.9 02/23/2015 1135    MONOABS 0.4 02/23/2015 1135   EOSABS 0.0 02/23/2015 1135   BASOSABS 0.0 02/23/2015 1135    Assessment/Plan:  1. Acute on chronic diastolic heart failure- unclear etiology of decompensation but he reports gaining a pound a day for the last 3-4 weeks 1. Responding to diuretics, continue per Cardiology 2. Plan for cardioversion as possible improvement of CHF 2. CKD stage 4.  Near baseline Scr and follows Dr. Mercy Moore.  No vascular acces has been placed.  1. No significant change in overall renal function 2. Daily weights, Daily Renal Panel, Strict I/Os, Avoid nephrotoxins (NSAIDs, judicious IV Contrast) 3. CAD s/p CABG- stable 4. A fib- rate controlled, plan for cardioversion on 02/27/15 per Cardiology. 5. HTN- on the low side. 6. Anemia of chronic disease- will check iron stores and initiate  ESA therapy 7. Chronic anticoagulation 8. Prostate cancer- stage T2a high-risk adenocarcinoma prostate  Ahren Pettinger A

## 2015-02-24 NOTE — Evaluation (Signed)
Physical Therapy Evaluation Patient Details Name: Christopher [name] MRN: [id number] DOB: 08/07/1926 Today's Date: 02/24/2015   History of Present Illness  Pt is an 81 y/o male with weight gain and increasing SOB, admitted for CHF and renal work up.  Clinical Impression  Pt with decreased strength and mobility and gait. Pt still with some Rt hip pain occasionally from surgery this spring. States he feels his hip is getting worse. Pt reports he feels he is breathing better than when he was admitted. Pt able to maintain spO2 95% on 2LO2 during activity. Pt will benefit from skilled PT to address deficits and increase functional independence for safe return home with home health.    Follow Up Recommendations Home health PT;Supervision - Intermittent    Equipment Recommendations       Recommendations for Other Services       Precautions / Restrictions Precautions Precautions: Fall Restrictions Weight Bearing Restrictions: No Other Position/Activity Restrictions: pt with h/o R hip surgery      Mobility  Bed Mobility Overal bed mobility: Modified Independent             General bed mobility comments: with bed rails  Transfers Overall transfer level: Needs assistance Equipment used: Rolling walker (2 wheeled) Transfers: Sit to/from Stand Sit to Stand: Min assist         General transfer comment: increased time, 3 attempts before able to stand with min A  Ambulation/Gait Ambulation/Gait assistance: Min guard Ambulation Distance (Feet): 25 Feet Assistive device: Rolling walker (2 wheeled)   Gait velocity: decreased   General Gait Details: slight antalgic gait due to R hip weakness, discomfort, no LOB during gait or turns  Science writer    Modified Rankin (Stroke Patients Only)       Balance                                             Pertinent Vitals/Pain Pain Assessment: No/denies pain    Home Living  Family/patient expects to be discharged to:: Private residence Living Arrangements: Spouse/significant other Available Help at Discharge: Family;Available 24 hours/day Type of Home: House Home Access: Ramped entrance     Home Layout: One level Home Equipment: Walker - 4 wheels      Prior Function Level of Independence: Independent with assistive device(s)         Comments: uses 4WW     Hand Dominance        Extremity/Trunk Assessment               Lower Extremity Assessment: Generalized weakness      Cervical / Trunk Assessment: Kyphotic  Communication   Communication: No difficulties  Cognition Arousal/Alertness: Awake/alert Behavior During Therapy: WFL for tasks assessed/performed Overall Cognitive Status: Within Functional Limits for tasks assessed                      General Comments      Exercises General Exercises - Lower Extremity Ankle Circles/Pumps: AROM;Both;10 reps Long Arc Quad: AROM;Both;10 reps Hip Flexion/Marching: AROM;Both;10 reps      Assessment/Plan    PT Assessment Patient needs continued PT services  PT Diagnosis Difficulty walking;Generalized weakness   PT Problem List Decreased strength;Decreased activity tolerance;Decreased balance;Decreased mobility;Cardiopulmonary status limiting activity;Decreased knowledge of use of DME  PT Treatment Interventions DME instruction;Balance training;Gait training;Stair training;Functional mobility training;Patient/family education;Therapeutic activities;Therapeutic exercise   PT Goals (Current goals can be found in the Care Plan section) Acute Rehab PT Goals Patient Stated Goal: get home PT Goal Formulation: With patient Time For Goal Achievement: 03/10/15 Potential to Achieve Goals: Good    Frequency Min 3X/week   Barriers to discharge        Co-evaluation               End of Session Equipment Utilized During Treatment: Gait belt;Oxygen Activity Tolerance: Patient  tolerated treatment well Patient left: in bed;with call bell/phone within reach;with bed alarm set           Time: 1335-1356 PT Time Calculation (min) (ACUTE ONLY): 21 min   Charges:     PT Treatments $Therapeutic Exercise: 8-22 mins   PT G Codes:        Yoanna Jurczyk 03/20/2015, 2:02 PM

## 2015-02-24 NOTE — Discharge Instructions (Signed)

## 2015-02-24 NOTE — Progress Notes (Signed)
Subjective:  Admitted yesterday with encompassing heart failure with failure to diurese as an outpatient.  Appreciate renal consultation.  Weight is down overnight and appears to be diuresing and he states that his breathing is better.  Still coughing.  Objective:  Vital Signs in the last 24 hours: BP 98/42 mmHg  Pulse 61  Temp(Src) 98.7 F (37.1 C) (Oral)  Resp 20  Ht 6\' 1"  (1.854 m)  Wt 106.142 kg (234 lb)  BMI 30.88 kg/m2  SpO2 94%  Physical Exam: Elderly white male in no acute distress sitting at bedside Lungs:  Clear  Cardiac:  Regular rhythm, normal S1 and S2, no S3 Abdomen:  Soft, nontender, no masses Extremities:  2+ edema present  Intake/Output from previous day: 12/15 0701 - 12/16 0700 In: -  Out: 1050 [Urine:1050] Weight Filed Weights   02/23/15 1125 02/23/15 1638 02/24/15 0516  Weight: 113.598 kg (250 lb 7 oz) 109.498 kg (241 lb 6.4 oz) 106.142 kg (234 lb)    Lab Results: Basic Metabolic Panel:  Recent Labs  02/23/15 1724 02/24/15 0631  NA 142 143  K 3.7 4.1  CL 105 101  CO2 29 33*  GLUCOSE 98 126*  BUN 66* 65*  CREATININE 3.31* 3.45*    CBC:  Recent Labs  02/23/15 1135  WBC 7.0  NEUTROABS 5.7  HGB 9.2*  HCT 30.4*  MCV 99.3  PLT 256    BNP    Component Value Date/Time   BNP 877.8* 02/23/2015 1135    PROTIME: Lab Results  Component Value Date   INR 2.43* 02/24/2015   INR 2.44* 02/23/2015   INR 2.81* 07/13/2014    Telemetry: Paced rhythm, underlying rhythm is atrial fibrillation  Assessment/Plan:  1.  Acute on chronic diastolic heart failure appears to be diuresing some 2.  Recurrent atrial fibrillation 3.  Functioning pacemaker 4.  Acute on chronic renal failure with slight increase in creatinine today 5.  Cough questionable due to amiodarone or heart failure  Recommendations:  Continue intravenous diuresis over weekend.  Plan cardioversion on Monday although he may not maintain sinus rhythm.  Feel with  diastolic  dysfunction that atrial kick is important to prevent recurrence of symptoms.  Worsening of symptoms correlated temporarily with the onset of atrial fibrillation.  Cardioversion and risks discussed with patient and wife and they are agreeable to proceed.  He may not be able to maintain amiodarone long-term but will determine after looking at his diuresis to see what effect this has on his cough.      Kerry Hough  MD [facility] Cardiology  02/24/2015, 8:36 AM

## 2015-02-25 DIAGNOSIS — I25812 Atherosclerosis of bypass graft of coronary artery of transplanted heart without angina pectoris: Secondary | ICD-10-CM

## 2015-02-25 DIAGNOSIS — N184 Chronic kidney disease, stage 4 (severe): Secondary | ICD-10-CM

## 2015-02-25 DIAGNOSIS — I4891 Unspecified atrial fibrillation: Secondary | ICD-10-CM

## 2015-02-25 DIAGNOSIS — Z950 Presence of cardiac pacemaker: Secondary | ICD-10-CM

## 2015-02-25 DIAGNOSIS — N179 Acute kidney failure, unspecified: Secondary | ICD-10-CM

## 2015-02-25 DIAGNOSIS — I5031 Acute diastolic (congestive) heart failure: Secondary | ICD-10-CM

## 2015-02-25 DIAGNOSIS — R05 Cough: Secondary | ICD-10-CM

## 2015-02-25 LAB — RENAL FUNCTION PANEL
ALBUMIN: 2.8 g/dL — AB (ref 3.5–5.0)
ANION GAP: 13 (ref 5–15)
BUN: 78 mg/dL — AB (ref 6–20)
CALCIUM: 8.6 mg/dL — AB (ref 8.9–10.3)
CO2: 30 mmol/L (ref 22–32)
CREATININE: 3.57 mg/dL — AB (ref 0.61–1.24)
Chloride: 98 mmol/L — ABNORMAL LOW (ref 101–111)
GFR calc Af Amer: 16 mL/min — ABNORMAL LOW (ref >60)
GFR calc non Af Amer: 14 mL/min — ABNORMAL LOW (ref >60)
GLUCOSE: 95 mg/dL (ref 65–99)
PHOSPHORUS: 5 mg/dL — AB (ref 2.5–4.6)
Potassium: 3.7 mmol/L (ref 3.5–5.1)
SODIUM: 141 mmol/L (ref 135–145)

## 2015-02-25 LAB — GLUCOSE, CAPILLARY
Glucose-Capillary: 91 mg/dL (ref 65–99)
Glucose-Capillary: 126 mg/dL — ABNORMAL HIGH (ref 65–99)
Glucose-Capillary: 121 mg/dL — ABNORMAL HIGH (ref 65–99)
Glucose-Capillary: 130 mg/dL — ABNORMAL HIGH (ref 65–99)

## 2015-02-25 LAB — CBC
HCT: 28.3 % — ABNORMAL LOW (ref 39.0–52.0)
HEMOGLOBIN: 8.6 g/dL — AB (ref 13.0–17.0)
MCH: 30 pg (ref 26.0–34.0)
MCHC: 30.4 g/dL (ref 30.0–36.0)
MCV: 98.6 fL (ref 78.0–100.0)
PLATELETS: 233 10*3/uL (ref 150–400)
RBC: 2.87 MIL/uL — AB (ref 4.22–5.81)
RDW: 17.7 % — ABNORMAL HIGH (ref 11.5–15.5)
WBC: 5.9 10*3/uL (ref 4.0–10.5)

## 2015-02-25 LAB — PROTIME-INR
INR: 2.31 — AB (ref 0.00–1.49)
PROTHROMBIN TIME: 25.2 s — AB (ref 11.6–15.2)

## 2015-02-25 MED ORDER — WARFARIN SODIUM 2 MG PO TABS: 2.0000 mg | ORAL_TABLET | ORAL | Status: DC

## 2015-02-25 MED ORDER — SODIUM CHLORIDE 0.9 % IV SOLN
510.0000 mg | INTRAVENOUS | Status: DC
  Administered 2015-02-25: 510 mg via INTRAVENOUS
  Filled 2015-02-25: qty 17

## 2015-02-25 MED ORDER — FUROSEMIDE 10 MG/ML IJ SOLN: 80.0000 mg | Freq: Two times a day (BID) | INTRAMUSCULAR | Status: DC

## 2015-02-25 MED ORDER — WARFARIN SODIUM 2 MG PO TABS
4.0000 mg | ORAL_TABLET | ORAL | Status: DC
  Administered 2015-02-25 – 2015-02-26 (×2): 4 mg via ORAL
  Filled 2015-02-25 (×2): qty 2

## 2015-02-25 MED ORDER — FUROSEMIDE 10 MG/ML IJ SOLN
40.0000 mg | Freq: Two times a day (BID) | INTRAMUSCULAR | Status: DC
  Administered 2015-02-25 – 2015-02-26 (×2): 40 mg via INTRAVENOUS
  Filled 2015-02-25 (×2): qty 4

## 2015-02-25 MED ORDER — DARBEPOETIN ALFA 60 MCG/0.3ML IJ SOSY
60.0000 ug | PREFILLED_SYRINGE | INTRAMUSCULAR | Status: DC
  Administered 2015-02-25: 60 ug via SUBCUTANEOUS
  Filled 2015-02-25: qty 0.3

## 2015-02-25 NOTE — Progress Notes (Signed)
ANTICOAGULATION CONSULT NOTE - Follow Up Consult  Pharmacy Consult for warfarin Indication: atrial fibrillation  Allergies  Allergen Reactions  . Crestor [Rosuvastatin] Other (See Comments)    Aches in joint   . Lipitor [Atorvastatin] Other (See Comments)    Joint aching  . Penicillins Rash  . Tramadol Nausea Only  . Vicodin [Hydrocodone-Acetaminophen] Anxiety    "Made me nervous"    Patient Measurements: Height: 6\' 1"  (185.4 cm) Weight: 235 lb 3.7 oz (106.7 kg) (scale a) IBW/kg (Calculated) : 79.9  Vital Signs: Temp: 97.6 F (36.4 C) (12/17 0918) Temp Source: Oral (12/17 0918) BP: 97/51 mmHg (12/17 0918) Pulse Rate: 61 (12/17 0918)  Labs:  Recent Labs  02/23/15 1135 02/23/15 1724 02/24/15 0631 02/25/15 0520  HGB 9.2*  --   --  8.6*  HCT 30.4*  --   --  28.3*  PLT 256  --   --  233  LABPROT 26.2*  --  26.1* 25.2*  INR 2.44*  --  2.43* 2.31*  CREATININE 3.37* 3.31* 3.45* 3.57*  TROPONINI 0.04*  --   --   --     Estimated Creatinine Clearance: 19 mL/min (by C-G formula based on Cr of 3.57).  Assessment:  88yom on coumadin pta for afib, being admitted with SOB, weight gain, and leg swelling 2/2 acute heart failure. Coumadin to continue. INR on admission therapeutic at 2.44. He was taking amiodarone 100mg  daily pta but dose has been increased to 200mg  daily so will need to watch for drug interaction. Plans noted for cardioversion.   Home dose: 4mg  daily except 2mg  on Friday  Current INR 2.31 hgb 8.6. plts 233  Goal of Therapy:  INR 2-3 Monitor platelets by anticoagulation protocol: Yes   Plan:  -Continue coumadin at home dose -Daily PT/INR for now  Hildred Laser, Pharm D 02/25/2015 10:22 AM

## 2015-02-25 NOTE — Progress Notes (Signed)
SUBJECTIVE: Feels breathing and leg swelling have markedly improved. Denies chest pains. Had some blood-tinged mucous when blowing nose earlier.     Intake/Output Summary (Last 24 hours) at 02/25/15 1024 Last data filed at 02/25/15 [id number]  Gross per 24 hour  Intake   1040 ml  Output   3626 ml  Net  -2586 ml    Current Facility-Administered Medications  Medication Dose Route Frequency Provider Last Rate Last Dose  . 0.9 %  sodium chloride infusion  250 mL Intravenous PRN Jacolyn Reedy, MD      . acetaminophen (TYLENOL) tablet 650 mg  650 mg Oral Q4H PRN Jacolyn Reedy, MD      . amiodarone (PACERONE) tablet 200 mg  200 mg Oral Daily Jacolyn Reedy, MD   200 mg at 02/25/15 [id number]  . atenolol (TENORMIN) tablet 25 mg  25 mg Oral q morning - 10a Jacolyn Reedy, MD   25 mg at 02/25/15 [id number]  . Darbepoetin Alfa (ARANESP) injection 60 mcg  60 mcg Subcutaneous Q Sat-1800 Donato Heinz, MD      . ferumoxytol [facility]) 510 mg in sodium chloride 0.9 % 100 mL IVPB  510 mg Intravenous Weekly Donato Heinz, MD      . fluticasone [facility]) 50 MCG/ACT nasal spray 1 spray  1 spray Each Nare QHS Jacolyn Reedy, MD   1 spray at 02/24/15 2214  . furosemide (LASIX) injection 80 mg  80 mg Intravenous BID Donato Heinz, MD      . glimepiride (AMARYL) tablet 1 mg  1 mg Oral QAC breakfast Jacolyn Reedy, MD   1 mg at 02/25/15 [id number]  . ondansetron (ZOFRAN) injection 4 mg  4 mg Intravenous Q6H PRN Jacolyn Reedy, MD      . senna-docusate (Senokot-S) tablet 2 tablet  2 tablet Oral BID Jacolyn Reedy, MD   2 tablet at 02/25/15 [phone]  . sodium chloride 0.9 % injection 3 mL  3 mL Intravenous Q12H Jacolyn Reedy, MD   3 mL at 02/24/15 2327  . sodium chloride 0.9 % injection 3 mL  3 mL Intravenous PRN Jacolyn Reedy, MD      . tamsulosin [facility]) capsule 0.4 mg  0.4 mg Oral Daily Jacolyn Reedy, MD   0.4 mg at 02/25/15 [id number]  . Warfarin - Pharmacist Dosing Inpatient   Does not  apply [place], [facility]        Filed Vitals:   02/25/15 0156 02/25/15 0413 02/25/15 0617 02/25/15 0918  BP: 102/56  106/51 97/51  Pulse: 67  61 61  Temp: 98.3 F (36.8 C)  98 F (36.7 C) 97.6 F (36.4 C)  TempSrc: Oral  Oral Oral  Resp: 18  18 20   Height:      Weight:  235 lb 3.7 oz (106.7 kg)    SpO2: 100%  96%     PHYSICAL EXAM General: NAD HEENT: Normal. Neck: No JVD, no thyromegaly.  Lungs: Bibasilar rales. CV: Nondisplaced PMI.  Regular rate and rhythm, normal S1/S2, no S3/S4, no murmur.  Trace pretibial edema.    Abdomen: Soft, nontender, no distention.  Neurologic: Alert and oriented x 3.  Psych: Normal affect. Musculoskeletal: No gross deformities. Extremities: No clubbing or cyanosis.   TELEMETRY: Reviewed telemetry pt in paced ventricular rhythm.  LABS: Basic Metabolic Panel:  Recent Labs  02/23/15 1135  02/24/15 0631 02/25/15 0520  NA 143  < > 143 141  K 3.9  < > 4.1 3.7  CL 103  < > 101 98*  CO2 30  < > 33* 30  GLUCOSE 102*  < > 126* 95  BUN 62*  < > 65* 78*  CREATININE 3.37*  < > 3.45* 3.57*  CALCIUM 9.4  < > 8.8* 8.6*  MG 2.4  --   --   --   PHOS  --   --   --  5.0*  < > = values in this interval not displayed. Liver Function Tests:  Recent Labs  02/23/15 1724 02/25/15 0520  AST 34  --   ALT 36  --   ALKPHOS 49  --   BILITOT 0.8  --   PROT 5.8*  --   ALBUMIN 3.1* 2.8*   No results for input(s): LIPASE, AMYLASE in the last 72 hours. CBC:  Recent Labs  02/23/15 1135 02/25/15 0520  WBC 7.0 5.9  NEUTROABS 5.7  --   HGB 9.2* 8.6*  HCT 30.4* 28.3*  MCV 99.3 98.6  PLT 256 233   Cardiac Enzymes:  Recent Labs  02/23/15 1135  TROPONINI 0.04*   BNP: Invalid input(s): POCBNP D-Dimer: No results for input(s): DDIMER in the last 72 hours. Hemoglobin A1C: No results for input(s): HGBA1C in the last 72 hours. Fasting Lipid Panel: No results for input(s): CHOL, HDL, LDLCALC, TRIG, CHOLHDL, LDLDIRECT in the last 72  hours. Thyroid Function Tests: No results for input(s): TSH, T4TOTAL, T3FREE, THYROIDAB in the last 72 hours.  Invalid input(s): FREET3 Anemia Panel:  Recent Labs  02/24/15 1628  VITAMINB12 998*  FERRITIN 288  TIBC 252  IRON 33*    RADIOLOGY: Dg Chest 2 View  02/20/2015  CLINICAL DATA:  Worsening shortness of breath and wheezing over the past several months. Shortness of breath is worse at night. Subsequent encounter. EXAM: CHEST  2 VIEW COMPARISON:  PA and lateral chest 10/11/2014.  CT chest 12/01/2014. FINDINGS: Mild bibasilar atelectasis is seen. The lungs are otherwise clear. No pneumothorax or pleural effusion. Heart size is mildly enlarged with a pacing device in place. The patient is status post CABG. IMPRESSION: No acute disease. Electronically Signed   By: Inge Rise M.D.   On: 02/20/2015 09:50   Ct Hip Right Wo Contrast  02/22/2015  CLINICAL DATA:  Closed neck fracture of the right femur. EXAM: CT OF THE RIGHT HIP WITHOUT CONTRAST TECHNIQUE: Multidetector CT imaging of the right hip was performed according to the standard protocol. Multiplanar CT image reconstructions were also generated. COMPARISON:  06/30/2014 and 06/30/2013 FINDINGS: Three lag screws traverse the left femoral neck fracture and extend into the femoral head. Although these are backed out up to 1 cm, they still lag the fracture site satisfactorily. There is nonunion along most of the fracture site, with cortication along the fracture plane. Anteriorly such cortication is less visible but there is little if any bridging bony anteriorly. We happen to include the entire bony pelvis and today's exam. There is bridging spurring of the right sacroiliac joint without evidence of osseous metastatic disease in the visualized bony structures. Aortoiliac atherosclerotic vascular disease. No pathologic pelvic adenopathy noted. Scattered diverticula of the sigmoid colon. Trace free pelvic fluid, etiology uncertain. There is  evidence of lower lumbar spondylosis and degenerative disc disease especially at L5-S1. On the right side this causes moderate right foraminal stenosis at L5-S1 and mild right foraminal stenosis at L4-5. There is grade 1 degenerative anterolisthesis at L4-5. IMPRESSION: 1. Nonunion along the  majority of the femoral neck fracture site with cortication. Anteriorly decortication is less visible which may indicate incomplete nonunion but I do not observe significant bony bridging. 2. The 3 lag screws still lag the fracture site successfully, although the bottom 2 screws seem to have backed out a few mm. 3. Lower lumbar spondylosis and degenerative disc disease. On the right side this is causing foraminal impingement at L4-5 and L5-S1. 4. Scattered sigmoid colon diverticula. 5. Trace free pelvic fluid, etiology uncertain. 6. No findings of osseous or nodal metastatic disease in the visualized pelvis. Electronically Signed   By: Van Clines M.D.   On: 02/22/2015 15:46   Dg Chest Port 1 View  02/23/2015  CLINICAL DATA:  Shortness of breath and cough for 2 days, history CHF, hypertension, pacemaker, type II diabetes mellitus, coronary artery disease, prostate cancer EXAM: PORTABLE CHEST 1 VIEW COMPARISON:  Portable exam 1135 hours compared to 02/20/2015 FINDINGS: LEFT subclavian transvenous pacemaker leads project over RIGHT atrium and RIGHT ventricle, unchanged. Enlargement of cardiac silhouette post CABG. Pulmonary vascular congestion. Bibasilar scarring. Minimal perihilar interstitial infiltrate at LEFT lung could represent asymmetric edema or infection. Remaining lungs clear. No pleural effusion or pneumothorax. IMPRESSION: Enlargement of cardiac silhouette post CABG and pacemaker. Perihilar infiltrate particularly on LEFT, question asymmetric edema versus infection. Electronically Signed   By: Lavonia Dana M.D.   On: 02/23/2015 12:14      ASSESSMENT AND PLAN: 1. Acute on chronic diastolic heart failure   2. Recurrent atrial fibrillation 3. Functioning pacemaker 4. Acute on chronic renal failure 5. Cough questionable due to amiodarone or heart failure 6. CAD with CABG  Recommendations:  Continue intravenous diuresis over weekend but given elevation in BUN and creatinine, will reduce to 40 mg IV bid. Will determine necessity for cardioversion on Monday although he may not maintain sinus rhythm. Feel with diastolic dysfunction that atrial kick is important to prevent recurrence of symptoms. Worsening of symptoms correlated temporarily with the onset of atrial fibrillation. Cardioversion and risks discussed by Dr. Wynonia Lawman with patient and wife and they are agreeable to proceed. He may not be able to maintain amiodarone long-term but will determine after looking at his diuresis to see what effect this has on his cough.    Kate Sable, M.D., F.A.C.C.

## 2015-02-25 NOTE — Progress Notes (Signed)
Patient ID: Christopher [name], male   DOB: [birth year], 81 y.o.   MRN: [id number] S:feels drained today O:BP 106/51 mmHg  Pulse 61  Temp(Src) 98 F (36.7 C) (Oral)  Resp 18  Ht 6\' 1"  (1.854 m)  Wt 106.7 kg (235 lb 3.7 oz)  BMI 31.04 kg/m2  SpO2 96%  Intake/Output Summary (Last 24 hours) at 02/25/15 0858 Last data filed at 02/25/15 0700  Gross per 24 hour  Intake    800 ml  Output   4250 ml  Net  -3450 ml   Intake/Output: I/O last 3 completed shifts: In: 1040 [P.O.:1040] Out: 5300 [Urine:5300]  Intake/Output this shift:    Weight change: -6.898 kg (-15 lb 3.3 oz) Gen:wd wn wm in nad CVS:no rub Resp:cta [id number] Ext:tr edema   Recent Labs Lab 02/20/15 0923 02/23/15 1135 02/23/15 1724 02/24/15 0631 02/25/15 0520  NA 143 143 142 143 141  K 4.4 3.9 3.7 4.1 3.7  CL 109 103 105 101 98*  CO2 27 30 29  33* 30  GLUCOSE 117* 102* 98 126* 95  BUN 65* 62* 66* 65* 78*  CREATININE 3.37* 3.37* 3.31* 3.45* 3.57*  ALBUMIN  --   --  3.1*  --  2.8*  CALCIUM 9.0 9.4 9.0 8.8* 8.6*  PHOS  --   --   --   --  5.0*  AST  --   --  34  --   --   ALT  --   --  36  --   --    Liver Function Tests:  Recent Labs Lab 02/23/15 1724 02/25/15 0520  AST 34  --   ALT 36  --   ALKPHOS 49  --   BILITOT 0.8  --   PROT 5.8*  --   ALBUMIN 3.1* 2.8*   No results for input(s): LIPASE, AMYLASE in the last 168 hours. No results for input(s): AMMONIA in the last 168 hours. CBC:  Recent Labs Lab 02/20/15 0923 02/23/15 1135 02/25/15 0520  WBC 6.8 7.0 5.9  NEUTROABS 5.7 5.7  --   HGB 9.2* 9.2* 8.6*  HCT 30.6* 30.4* 28.3*  MCV 98.4 99.3 98.6  PLT 292 256 233   Cardiac Enzymes:  Recent Labs Lab 02/20/15 0923 02/23/15 1135  TROPONINI <0.03 0.04*   CBG:  Recent Labs Lab 02/23/15 2058 02/24/15 0613 02/24/15 1148 02/24/15 1646 02/25/15 0629  GLUCAP 131* 127* 111* 102* 91    Iron Studies:  Recent Labs  02/24/15 1628  IRON 33*  TIBC 252  FERRITIN 288    Studies/Results: Dg Chest Port 1 View  02/23/2015  CLINICAL DATA:  Shortness of breath and cough for 2 days, history CHF, hypertension, pacemaker, type II diabetes mellitus, coronary artery disease, prostate cancer EXAM: PORTABLE CHEST 1 VIEW COMPARISON:  Portable exam 1135 hours compared to 02/20/2015 FINDINGS: LEFT subclavian transvenous pacemaker leads project over RIGHT atrium and RIGHT ventricle, unchanged. Enlargement of cardiac silhouette post CABG. Pulmonary vascular congestion. Bibasilar scarring. Minimal perihilar interstitial infiltrate at LEFT lung could represent asymmetric edema or infection. Remaining lungs clear. No pleural effusion or pneumothorax. IMPRESSION: Enlargement of cardiac silhouette post CABG and pacemaker. Perihilar infiltrate particularly on LEFT, question asymmetric edema versus infection. Electronically Signed   By: Lavonia Dana M.D.   On: 02/23/2015 12:14   . amiodarone  200 mg Oral Daily  . atenolol  25 mg Oral q morning - 10a  . fluticasone  1 spray Each Nare QHS  . furosemide  80 mg  Intravenous Q6H  . glimepiride  1 mg Oral QAC breakfast  . senna-docusate  2 tablet Oral BID  . sodium chloride  3 mL Intravenous Q12H  . tamsulosin  0.4 mg Oral Daily  . Warfarin - Pharmacist Dosing Inpatient   Does not apply q1800    BMET    Component Value Date/Time   NA 141 02/25/2015 0520   K 3.7 02/25/2015 0520   CL 98* 02/25/2015 0520   CO2 30 02/25/2015 0520   GLUCOSE 95 02/25/2015 0520   BUN 78* 02/25/2015 0520   CREATININE 3.57* 02/25/2015 0520   CALCIUM 8.6* 02/25/2015 0520   GFRNONAA 14* 02/25/2015 0520   GFRAA 16* 02/25/2015 0520   CBC    Component Value Date/Time   WBC 5.9 02/25/2015 0520   RBC 2.87* 02/25/2015 0520   HGB 8.6* 02/25/2015 0520   HCT 28.3* 02/25/2015 0520   PLT 233 02/25/2015 0520   MCV 98.6 02/25/2015 0520   MCH 30.0 02/25/2015 0520   MCHC 30.4 02/25/2015 0520   RDW 17.7* 02/25/2015 0520   LYMPHSABS 0.9 02/23/2015 1135    MONOABS 0.4 02/23/2015 1135   EOSABS 0.0 02/23/2015 1135   BASOSABS 0.0 02/23/2015 1135     Assessment/Plan:  1. Acute on chronic diastolic heart failure- unclear etiology of decompensation but he reports gaining a pound a day for the last 3-4 weeks 1. Responding to diuretics, continue per Cardiology 2. Plan for cardioversion 02/27/15 as possible improvement of CHF 2. CKD stage 4. Near baseline Scr and follows Dr. Mercy Moore. No vascular acces has been placed.  1. Slight increase in Scr but no significant change in overall renal function 2. Given large diuretic response will decrease lasix from q6 to bid and follow 3. Daily weights, Daily Renal Panel, Strict I/Os, Avoid nephrotoxins (NSAIDs, judicious IV Contrast) 3. CAD s/p CABG- stable 4. A fib- rate controlled, plan for cardioversion on 02/27/15 per Cardiology. 5. HTN- on the low side. 6. Anemia of chronic disease- low iron stores  1. IV Feraheme and initiate ESA therapy 7. CKD-MBD- borderline high phos, will follow and initiate binders if needed 8. Chronic anticoagulation 9. Prostate cancer- stage T2a high-risk adenocarcinoma prostate  Tyrianna Lightle A

## 2015-02-26 DIAGNOSIS — I480 Paroxysmal atrial fibrillation: Secondary | ICD-10-CM

## 2015-02-26 LAB — RENAL FUNCTION PANEL
Albumin: 2.8 g/dL — ABNORMAL LOW (ref 3.5–5.0)
Anion gap: 10 (ref 5–15)
BUN: 84 mg/dL — ABNORMAL HIGH (ref 6–20)
CO2: 35 mmol/L — ABNORMAL HIGH (ref 22–32)
Calcium: 8.8 mg/dL — ABNORMAL LOW (ref 8.9–10.3)
Chloride: 95 mmol/L — ABNORMAL LOW (ref 101–111)
Creatinine, Ser: 3.88 mg/dL — ABNORMAL HIGH (ref 0.61–1.24)
GFR calc Af Amer: 15 mL/min — ABNORMAL LOW
GFR calc non Af Amer: 13 mL/min — ABNORMAL LOW
Glucose, Bld: 90 mg/dL (ref 65–99)
Phosphorus: 5.5 mg/dL — ABNORMAL HIGH (ref 2.5–4.6)
Potassium: 3.8 mmol/L (ref 3.5–5.1)
Sodium: 140 mmol/L (ref 135–145)

## 2015-02-26 LAB — GLUCOSE, CAPILLARY
GLUCOSE-CAPILLARY: 132 mg/dL — AB (ref 65–99)
Glucose-Capillary: 95 mg/dL (ref 65–99)
Glucose-Capillary: 97 mg/dL (ref 65–99)
Glucose-Capillary: 98 mg/dL (ref 65–99)

## 2015-02-26 LAB — CBC
HEMATOCRIT: 30.3 % — AB (ref 39.0–52.0)
Hemoglobin: 9.3 g/dL — ABNORMAL LOW (ref 13.0–17.0)
MCH: 30.2 pg (ref 26.0–34.0)
MCHC: 30.7 g/dL (ref 30.0–36.0)
MCV: 98.4 fL (ref 78.0–100.0)
PLATELETS: 263 10*3/uL (ref 150–400)
RBC: 3.08 MIL/uL — ABNORMAL LOW (ref 4.22–5.81)
RDW: 17.5 % — AB (ref 11.5–15.5)
WBC: 7 10*3/uL (ref 4.0–10.5)

## 2015-02-26 LAB — PROTIME-INR
INR: 2.06 — ABNORMAL HIGH (ref 0.00–1.49)
Prothrombin Time: 23 seconds — ABNORMAL HIGH (ref 11.6–15.2)

## 2015-02-26 MED ORDER — HYDROCORTISONE 1 % EX CREA
1.0000 "application " | TOPICAL_CREAM | Freq: Three times a day (TID) | CUTANEOUS | Status: DC | PRN
  Filled 2015-02-26: qty 28

## 2015-02-26 MED ORDER — SEVELAMER CARBONATE 800 MG PO TABS
1600.0000 mg | ORAL_TABLET | Freq: Three times a day (TID) | ORAL | Status: DC
  Administered 2015-02-26 – 2015-02-28 (×5): 1600 mg via ORAL
  Filled 2015-02-26 (×5): qty 2

## 2015-02-26 MED ORDER — FUROSEMIDE 10 MG/ML IJ SOLN
40.0000 mg | Freq: Every day | INTRAMUSCULAR | Status: DC
  Administered 2015-02-27: 40 mg via INTRAVENOUS
  Filled 2015-02-26: qty 4

## 2015-02-26 MED ORDER — SODIUM CHLORIDE 0.45 % IV SOLN
INTRAVENOUS | Status: DC
  Administered 2015-02-27: 07:00:00 via INTRAVENOUS

## 2015-02-26 NOTE — Progress Notes (Signed)
SUBJECTIVE: Denies shortness of breath, palps, and chest pain. Says he feels "about the same as yesterday".     Intake/Output Summary (Last 24 hours) at 02/26/15 1038 Last data filed at 02/26/15 1028  Gross per 24 hour  Intake    903 ml  Output   1075 ml  Net   -172 ml    Current Facility-Administered Medications  Medication Dose Route Frequency Provider Last Rate Last Dose  . 0.45 % sodium chloride infusion   Intravenous Continuous Jacolyn Reedy, MD      . 0.9 %  sodium chloride infusion  250 mL Intravenous PRN Jacolyn Reedy, MD      . acetaminophen (TYLENOL) tablet 650 mg  650 mg Oral Q4H PRN Jacolyn Reedy, MD      . amiodarone (PACERONE) tablet 200 mg  200 mg Oral Daily Jacolyn Reedy, MD   200 mg at 02/26/15 1026  . atenolol (TENORMIN) tablet 25 mg  25 mg Oral q morning - 10a Jacolyn Reedy, MD   25 mg at 02/26/15 1026  . Darbepoetin Alfa (ARANESP) injection 60 mcg  60 mcg Subcutaneous Q Sat-1800 Donato Heinz, MD   60 mcg at 02/25/15 2027  . ferumoxytol [facility]) 510 mg in sodium chloride 0.9 % 100 mL IVPB  510 mg Intravenous Weekly Donato Heinz, MD   510 mg at 02/25/15 1230  . fluticasone (FLONASE) 50 MCG/ACT nasal spray 1 spray  1 spray Each Nare QHS Jacolyn Reedy, MD   1 spray at 02/25/15 2151  . furosemide (LASIX) injection 40 mg  40 mg Intravenous BID Herminio Commons, MD   40 mg at 02/26/15 [id number]  . glimepiride (AMARYL) tablet 1 mg  1 mg Oral QAC breakfast Jacolyn Reedy, MD   1 mg at 02/26/15 [id number]  . hydrocortisone cream 1 % 1 application  1 application Topical TID PRN Jacolyn Reedy, MD      . ondansetron [facility]) injection 4 mg  4 mg Intravenous Q6H PRN Jacolyn Reedy, MD      . senna-docusate (Senokot-S) tablet 2 tablet  2 tablet Oral BID Jacolyn Reedy, MD   2 tablet at 02/26/15 [phone]  . sevelamer carbonate (RENVELA) tablet 1,600 mg  1,600 mg Oral TID WC Donato Heinz, MD      . sodium chloride 0.9 % injection 3 mL  3 mL  Intravenous Q12H Jacolyn Reedy, MD   3 mL at 02/26/15 0908  . sodium chloride 0.9 % injection 3 mL  3 mL Intravenous PRN Jacolyn Reedy, MD      . tamsulosin [facility]) capsule 0.4 mg  0.4 mg Oral Daily Jacolyn Reedy, MD   0.4 mg at 02/26/15 0907  . [START ON 03/03/2015] warfarin (COUMADIN) tablet 2 mg  2 mg Oral Q Fri-1800 Kris Mouton, [facility]      . warfarin (COUMADIN) tablet 4 mg  4 mg Oral Once per day on Sun Mon Tue Wed Thu Sat Kris Mouton, RPH   4 mg at 02/25/15 1806  . Warfarin - Pharmacist Dosing Inpatient   Does not apply q1800 Otilio Miu, [facility]        Filed Vitals:   02/25/15 1800 02/25/15 2047 02/26/15 0629 02/26/15 0850  BP: 106/45 110/53 98/49 100/49  Pulse: 59 59 61 63  Temp:  98.1 F (36.7 C) 97.8 F (36.6 C) 98.3 F (36.8 C)  TempSrc:  Oral Oral Oral  Resp:  22 20 20   Height:      Weight:   229 lb 14.4 oz (104.282 kg)   SpO2:  93% 96% 96%    PHYSICAL EXAM General: NAD HEENT: Normal. Neck: No JVD, no thyromegaly.  Lungs: Clear. CV: Nondisplaced PMI. Regular rate and rhythm, normal S1/S2, no S3/S4, no murmur. Trace pretibial edema.  Abdomen: Soft, nontender, no distention.  Neurologic: Alert and oriented x 3.  Psych: Normal affect. Musculoskeletal: No gross deformities. Extremities: No clubbing or cyanosis.   TELEMETRY: Reviewed telemetry pt in paced ventricular rhythm.  LABS: Basic Metabolic Panel:  Recent Labs  02/23/15 1135  02/25/15 0520 02/26/15 0207  NA 143  < > 141 140  K 3.9  < > 3.7 3.8  CL 103  < > 98* 95*  CO2 30  < > 30 35*  GLUCOSE 102*  < > 95 90  BUN 62*  < > 78* 84*  CREATININE 3.37*  < > 3.57* 3.88*  CALCIUM 9.4  < > 8.6* 8.8*  MG 2.4  --   --   --   PHOS  --   --  5.0* 5.5*  < > = values in this interval not displayed. Liver Function Tests:  Recent Labs  02/23/15 1724 02/25/15 0520 02/26/15 0207  AST 34  --   --   ALT 36  --   --   ALKPHOS 49  --   --   BILITOT 0.8  --   --   PROT 5.8*  --   --     ALBUMIN 3.1* 2.8* 2.8*   No results for input(s): LIPASE, AMYLASE in the last 72 hours. CBC:  Recent Labs  02/23/15 1135 02/25/15 0520 02/26/15 0207  WBC 7.0 5.9 7.0  NEUTROABS 5.7  --   --   HGB 9.2* 8.6* 9.3*  HCT 30.4* 28.3* 30.3*  MCV 99.3 98.6 98.4  PLT 256 233 263   Cardiac Enzymes:  Recent Labs  02/23/15 1135  TROPONINI 0.04*   BNP: Invalid input(s): POCBNP D-Dimer: No results for input(s): DDIMER in the last 72 hours. Hemoglobin A1C: No results for input(s): HGBA1C in the last 72 hours. Fasting Lipid Panel: No results for input(s): CHOL, HDL, LDLCALC, TRIG, CHOLHDL, LDLDIRECT in the last 72 hours. Thyroid Function Tests: No results for input(s): TSH, T4TOTAL, T3FREE, THYROIDAB in the last 72 hours.  Invalid input(s): FREET3 Anemia Panel:  Recent Labs  02/24/15 1628  VITAMINB12 998*  FERRITIN 288  TIBC 252  IRON 33*    RADIOLOGY: Dg Chest 2 View  02/20/2015  CLINICAL DATA:  Worsening shortness of breath and wheezing over the past several months. Shortness of breath is worse at night. Subsequent encounter. EXAM: CHEST  2 VIEW COMPARISON:  PA and lateral chest 10/11/2014.  CT chest 12/01/2014. FINDINGS: Mild bibasilar atelectasis is seen. The lungs are otherwise clear. No pneumothorax or pleural effusion. Heart size is mildly enlarged with a pacing device in place. The patient is status post CABG. IMPRESSION: No acute disease. Electronically Signed   By: Inge Rise M.D.   On: 02/20/2015 09:50   Ct Hip Right Wo Contrast  02/22/2015  CLINICAL DATA:  Closed neck fracture of the right femur. EXAM: CT OF THE RIGHT HIP WITHOUT CONTRAST TECHNIQUE: Multidetector CT imaging of the right hip was performed according to the standard protocol. Multiplanar CT image reconstructions were also generated. COMPARISON:  06/30/2014 and 06/30/2013 FINDINGS: Three lag screws traverse the left femoral neck fracture and extend  into the femoral head. Although these are backed  out up to 1 cm, they still lag the fracture site satisfactorily. There is nonunion along most of the fracture site, with cortication along the fracture plane. Anteriorly such cortication is less visible but there is little if any bridging bony anteriorly. We happen to include the entire bony pelvis and today's exam. There is bridging spurring of the right sacroiliac joint without evidence of osseous metastatic disease in the visualized bony structures. Aortoiliac atherosclerotic vascular disease. No pathologic pelvic adenopathy noted. Scattered diverticula of the sigmoid colon. Trace free pelvic fluid, etiology uncertain. There is evidence of lower lumbar spondylosis and degenerative disc disease especially at L5-S1. On the right side this causes moderate right foraminal stenosis at L5-S1 and mild right foraminal stenosis at L4-5. There is grade 1 degenerative anterolisthesis at L4-5. IMPRESSION: 1. 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 Anteriorly decortication is less visible which may indicate incomplete nonunion but I do not observe significant bony bridging. 2. The 3 lag screws still lag the fracture site successfully, although the bottom 2 screws seem to have backed out a few mm. 3. Lower lumbar spondylosis and degenerative disc disease. On the right side this is causing foraminal impingement at L4-5 and L5-S1. 4. Scattered sigmoid colon diverticula. 5. Trace free pelvic fluid, etiology uncertain. 6. No findings of osseous or nodal metastatic disease in the visualized pelvis. Electronically Signed   By: Van Clines M.D.   On: 02/22/2015 15:46   Dg Chest Port 1 View  02/23/2015  CLINICAL DATA:  Shortness of breath and cough for 2 days, history CHF, hypertension, pacemaker, type II diabetes mellitus, coronary artery disease, prostate cancer EXAM: PORTABLE CHEST 1 VIEW COMPARISON:  Portable exam 1135 hours compared to 02/20/2015 FINDINGS: LEFT subclavian  transvenous pacemaker leads project over RIGHT atrium and RIGHT ventricle, unchanged. Enlargement of cardiac silhouette post CABG. Pulmonary vascular congestion. Bibasilar scarring. Minimal perihilar interstitial infiltrate at LEFT lung could represent asymmetric edema or infection. Remaining lungs clear. No pleural effusion or pneumothorax. IMPRESSION: Enlargement of cardiac silhouette post CABG and pacemaker. Perihilar infiltrate particularly on LEFT, question asymmetric edema versus infection. Electronically Signed   By: Lavonia Dana M.D.   On: 02/23/2015 12:14      ASSESSMENT AND PLAN: 1. Acute on chronic diastolic heart failure  2. Recurrent atrial fibrillation 3. Functioning pacemaker 4. Acute on chronic renal failure 5. Cough questionable due to amiodarone or heart failure 6. CAD with CABG  Recommendations:  Continue intravenous diuresis over weekend but given elevation in BUN and creatinine, will reduce to 40 mg IV daily (GFR essentially unchanged). Will determine necessity for cardioversion on Monday although he may not maintain sinus rhythm. Feel with diastolic dysfunction that atrial kick is important to prevent recurrence of symptoms. Worsening of symptoms correlated temporarily with the onset of atrial fibrillation. Cardioversion and risks discussed by Dr. Wynonia Lawman with patient and wife and they are agreeable to proceed. He may not be able to maintain amiodarone long-term but will determine after looking at his diuresis to see what effect this has on his cough.    Kate Sable, M.D., F.A.C.C.

## 2015-02-26 NOTE — Progress Notes (Signed)
Patient ID: Christopher [name], male   DOB: [birth year], 81 y.o.   MRN: [id number] S:no new complaints O:BP 98/49 mmHg  Pulse 61  Temp(Src) 97.8 F (36.6 C) (Oral)  Resp 20  Ht 6\' 1"  (1.854 m)  Wt 104.282 kg (229 lb 14.4 oz)  BMI 30.34 kg/m2  SpO2 96%  Intake/Output Summary (Last 24 hours) at 02/26/15 0833 Last data filed at 02/26/15 [id number]  Gross per 24 hour  Intake   1080 ml  Output    501 ml  Net    579 ml   Intake/Output: I/O last 3 completed shifts: In: 1440 [P.O.:1440] Out: 2726 [Urine:2725; Stool:1]  Intake/Output this shift:    Weight change: -2.418 kg (-5 lb 5.3 oz) Gen:WD WN WM in NAD CVS:RRR no rub Resp:cta [id number] Ext:+pretib edema   Recent Labs Lab 02/20/15 0923 02/23/15 1135 02/23/15 1724 02/24/15 0631 02/25/15 0520 02/26/15 0207  NA 143 143 142 143 141 140  K 4.4 3.9 3.7 4.1 3.7 3.8  CL 109 103 105 101 98* 95*  CO2 27 30 29  33* 30 35*  GLUCOSE 117* 102* 98 126* 95 90  BUN 65* 62* 66* 65* 78* 84*  CREATININE 3.37* 3.37* 3.31* 3.45* 3.57* 3.88*  ALBUMIN  --   --  3.1*  --  2.8* 2.8*  CALCIUM 9.0 9.4 9.0 8.8* 8.6* 8.8*  PHOS  --   --   --   --  5.0* 5.5*  AST  --   --  34  --   --   --   ALT  --   --  36  --   --   --    Liver Function Tests:  Recent Labs Lab 02/23/15 1724 02/25/15 0520 02/26/15 0207  AST 34  --   --   ALT 36  --   --   ALKPHOS 49  --   --   BILITOT 0.8  --   --   PROT 5.8*  --   --   ALBUMIN 3.1* 2.8* 2.8*   No results for input(s): LIPASE, AMYLASE in the last 168 hours. No results for input(s): AMMONIA in the last 168 hours. CBC:  Recent Labs Lab 02/20/15 0923 02/23/15 1135 02/25/15 0520 02/26/15 0207  WBC 6.8 7.0 5.9 7.0  NEUTROABS 5.7 5.7  --   --   HGB 9.2* 9.2* 8.6* 9.3*  HCT 30.6* 30.4* 28.3* 30.3*  MCV 98.4 99.3 98.6 98.4  PLT 292 256 233 263   Cardiac Enzymes:  Recent Labs Lab 02/20/15 0923 02/23/15 1135  TROPONINI <0.03 0.04*   CBG:  Recent Labs Lab 02/25/15 0629 02/25/15 1103  02/25/15 1621 02/25/15 2052 02/26/15 0644  GLUCAP 91 126* 121* 130* 95    Iron Studies:  Recent Labs  02/24/15 1628  IRON 33*  TIBC 252  FERRITIN 288   Studies/Results: No results found. Marland Kitchen amiodarone  200 mg Oral Daily  . atenolol  25 mg Oral q morning - 10a  . darbepoetin (ARANESP) injection - NON-DIALYSIS  60 mcg Subcutaneous Q Sat-1800  . ferumoxytol  510 mg Intravenous Weekly  . fluticasone  1 spray Each Nare QHS  . furosemide  40 mg Intravenous BID  . glimepiride  1 mg Oral QAC breakfast  . senna-docusate  2 tablet Oral BID  . sodium chloride  3 mL Intravenous Q12H  . tamsulosin  0.4 mg Oral Daily  . [START ON 03/03/2015] warfarin  2 mg Oral Q Fri-1800  . warfarin  4  mg Oral Once per day on Sun Mon Tue Wed Thu Sat  . Warfarin - Pharmacist Dosing Inpatient   Does not apply q1800    BMET    Component Value Date/Time   NA 140 02/26/2015 0207   K 3.8 02/26/2015 0207   CL 95* 02/26/2015 0207   CO2 35* 02/26/2015 0207   GLUCOSE 90 02/26/2015 0207   BUN 84* 02/26/2015 0207   CREATININE 3.88* 02/26/2015 0207   CALCIUM 8.8* 02/26/2015 0207   GFRNONAA 13* 02/26/2015 0207   GFRAA 15* 02/26/2015 0207   CBC    Component Value Date/Time   WBC 7.0 02/26/2015 0207   RBC 3.08* 02/26/2015 0207   HGB 9.3* 02/26/2015 0207   HCT 30.3* 02/26/2015 0207   PLT 263 02/26/2015 0207   MCV 98.4 02/26/2015 0207   MCH 30.2 02/26/2015 0207   MCHC 30.7 02/26/2015 0207   RDW 17.5* 02/26/2015 0207   LYMPHSABS 0.9 02/23/2015 1135   MONOABS 0.4 02/23/2015 1135   EOSABS 0.0 02/23/2015 1135   BASOSABS 0.0 02/23/2015 1135     Assessment/Plan:  1. Acute on chronic diastolic heart failure- unclear etiology of decompensation but he reports gaining a pound a day for the last 3-4 weeks 1. Responding to diuretics, continue per Cardiology 2. Plan for cardioversion 02/27/15 as possible improvement of CHF 2. CKD stage 4. Near baseline Scr and follows Dr. Mercy Moore. No vascular acces has  been placed.  1. Slight increase in Scr above baseline.  (but no significant change in overall renal function) 2. Given large diuretic response decreased lasix from q6 to bid with slowed rise in BUN/Cr. 3. Daily weights, Daily Renal Panel, Strict I/Os, Avoid nephrotoxins (NSAIDs, judicious IV Contrast) 3. CAD s/p CABG- stable 4. A fib- rate controlled, plan for cardioversion on 02/27/15 per Cardiology. 5. HTN- on the low side. 6. Anemia of chronic disease- low iron stores  1. IV Feraheme and initiated ESA therapy 02/25/15 7. CKD-MBD- high phos, will initiate binders and follow 8. Chronic anticoagulation 9. Prostate cancer- stage T2a high-risk adenocarcinoma prostate 10. Disposition- may need to have vascular access placed this admission as he is still interested in RRT if needed. [place] A

## 2015-02-26 NOTE — Progress Notes (Signed)
ANTICOAGULATION CONSULT NOTE - Follow Up Consult  Pharmacy Consult for warfarin Indication: atrial fibrillation  Allergies  Allergen Reactions  . Crestor [Rosuvastatin] Other (See Comments)    Aches in joint   . Lipitor [Atorvastatin] Other (See Comments)    Joint aching  . Penicillins Rash  . Tramadol Nausea Only  . Vicodin [Hydrocodone-Acetaminophen] Anxiety    "Made me nervous"    Patient Measurements: Height: 6\' 1"  (185.4 cm) Weight: 229 lb 14.4 oz (104.282 kg) (scale a) IBW/kg (Calculated) : 79.9  Vital Signs: Temp: 98.3 F (36.8 C) (12/18 0850) Temp Source: Oral (12/18 0850) BP: 100/49 mmHg (12/18 0850) Pulse Rate: 63 (12/18 0850)  Labs:  Recent Labs  02/23/15 1135  02/24/15 0631 02/25/15 0520 02/26/15 0207  HGB 9.2*  --   --  8.6* 9.3*  HCT 30.4*  --   --  28.3* 30.3*  PLT 256  --   --  233 263  LABPROT 26.2*  --  26.1* 25.2* 23.0*  INR 2.44*  --  2.43* 2.31* 2.06*  CREATININE 3.37*  < > 3.45* 3.57* 3.88*  TROPONINI 0.04*  --   --   --   --   < > = values in this interval not displayed.  Estimated Creatinine Clearance: 17.3 mL/min (by C-G formula based on Cr of 3.88).  Assessment:  88yom on coumadin pta for afib, being admitted with SOB, weight gain, and leg swelling 2/2 acute heart failure. Coumadin to continue. INR on admission therapeutic at 2.44. He was taking amiodarone 100mg  daily pta but dose has been increased to 200mg  daily so will need to watch for drug interaction. Plans noted for cardioversion on 12/19.   Home dose: 4mg  daily except 2mg  on Friday  Current INR 2.08, hgb 8.6. plts 233  Goal of Therapy:  INR 2-3 Monitor platelets by anticoagulation protocol: Yes   Plan:  -Continue coumadin at home dose -Daily PT/INR for now  Hildred Laser, Pharm D 02/26/2015 10:26 AM

## 2015-02-26 NOTE — Progress Notes (Signed)
Patient refused bed alarm because spouse at bedside. Charge nurse notified.

## 2015-02-27 ENCOUNTER — Encounter (HOSPITAL_COMMUNITY)
Admission: EM | Disposition: A | Discharge status: Home / Self Care | Payer: Self-pay | Source: Home / Self Care | Attending: Cardiology

## 2015-02-27 ENCOUNTER — Inpatient Hospital Stay (HOSPITAL_COMMUNITY): Payer: Medicare Other | Admitting: Anesthesiology

## 2015-02-27 ENCOUNTER — Ambulatory Visit (HOSPITAL_COMMUNITY): Admission: RE | Admit: 2015-02-27 | Payer: Medicare Other | Source: Ambulatory Visit | Admitting: Cardiology

## 2015-02-27 ENCOUNTER — Encounter (HOSPITAL_COMMUNITY): Payer: Self-pay

## 2015-02-27 HISTORY — PX: CARDIOVERSION: SHX1299

## 2015-02-27 LAB — RENAL FUNCTION PANEL
ALBUMIN: 2.9 g/dL — AB (ref 3.5–5.0)
ANION GAP: 11 (ref 5–15)
BUN: 86 mg/dL — ABNORMAL HIGH (ref 6–20)
CALCIUM: 8.8 mg/dL — AB (ref 8.9–10.3)
CO2: 33 mmol/L — ABNORMAL HIGH (ref 22–32)
Chloride: 95 mmol/L — ABNORMAL LOW (ref 101–111)
Creatinine, Ser: 3.59 mg/dL — ABNORMAL HIGH (ref 0.61–1.24)
GFR calc Af Amer: 16 mL/min — ABNORMAL LOW (ref >60)
GFR, EST NON AFRICAN AMERICAN: 14 mL/min — AB (ref >60)
GLUCOSE: 92 mg/dL (ref 65–99)
PHOSPHORUS: 3.9 mg/dL (ref 2.5–4.6)
POTASSIUM: 3.4 mmol/L — AB (ref 3.5–5.1)
SODIUM: 139 mmol/L (ref 135–145)

## 2015-02-27 LAB — GLUCOSE, CAPILLARY
GLUCOSE-CAPILLARY: 89 mg/dL (ref 65–99)
GLUCOSE-CAPILLARY: 95 mg/dL (ref 65–99)
GLUCOSE-CAPILLARY: 108 mg/dL — AB (ref 65–99)
GLUCOSE-CAPILLARY: 135 mg/dL — AB (ref 65–99)

## 2015-02-27 LAB — CBC
HEMATOCRIT: 30.1 % — AB (ref 39.0–52.0)
HEMOGLOBIN: 9.3 g/dL — AB (ref 13.0–17.0)
MCH: 30.2 pg (ref 26.0–34.0)
MCHC: 30.9 g/dL (ref 30.0–36.0)
MCV: 97.7 fL (ref 78.0–100.0)
Platelets: 265 10*3/uL (ref 150–400)
RBC: 3.08 MIL/uL — ABNORMAL LOW (ref 4.22–5.81)
RDW: 17.3 % — AB (ref 11.5–15.5)
WBC: 7.1 10*3/uL (ref 4.0–10.5)

## 2015-02-27 LAB — PROTIME-INR
INR: 1.92 — ABNORMAL HIGH (ref 0.00–1.49)
Prothrombin Time: 21.9 seconds — ABNORMAL HIGH (ref 11.6–15.2)

## 2015-02-27 LAB — HEPARIN LEVEL (UNFRACTIONATED): HEPARIN UNFRACTIONATED: 0.13 [IU]/mL — AB (ref 0.30–0.70)

## 2015-02-27 SURGERY — CARDIOVERSION
Anesthesia: General

## 2015-02-27 MED ORDER — HEPARIN BOLUS VIA INFUSION
2000.0000 [IU] | Freq: Once | INTRAVENOUS | Status: AC
Start: 2015-02-27 — End: 2015-02-27
  Administered 2015-02-27: 2000 [IU] via INTRAVENOUS
  Filled 2015-02-27: qty 2000

## 2015-02-27 MED ORDER — WARFARIN SODIUM 2 MG PO TABS: 4.0000 mg | ORAL_TABLET | ORAL | Status: DC

## 2015-02-27 MED ORDER — POTASSIUM CHLORIDE 20 MEQ PO PACK
20.0000 meq | PACK | Freq: Two times a day (BID) | ORAL | Status: DC
  Filled 2015-02-27 (×2): qty 1

## 2015-02-27 MED ORDER — LIDOCAINE HCL (CARDIAC) 20 MG/ML IV SOLN
INTRAVENOUS | Status: DC | PRN
  Administered 2015-02-27: 60 mg via INTRAVENOUS

## 2015-02-27 MED ORDER — POTASSIUM CHLORIDE CRYS ER 20 MEQ PO TBCR
20.0000 meq | EXTENDED_RELEASE_TABLET | Freq: Once | ORAL | Status: AC
  Administered 2015-02-27: 20 meq via ORAL
  Filled 2015-02-27: qty 1

## 2015-02-27 MED ORDER — PROPOFOL 10 MG/ML IV BOLUS
INTRAVENOUS | Status: DC | PRN
  Administered 2015-02-27: 50 mg via INTRAVENOUS

## 2015-02-27 MED ORDER — POTASSIUM CHLORIDE CRYS ER 20 MEQ PO TBCR
20.0000 meq | EXTENDED_RELEASE_TABLET | Freq: Two times a day (BID) | ORAL | Status: AC
  Administered 2015-02-27 (×2): 20 meq via ORAL
  Filled 2015-02-27 (×2): qty 1

## 2015-02-27 MED ORDER — HEPARIN (PORCINE) IN NACL 100-0.45 UNIT/ML-% IJ SOLN
1700.0000 [IU]/h | INTRAMUSCULAR | Status: DC
  Administered 2015-02-27: 1450 [IU]/h via INTRAVENOUS
  Administered 2015-02-27: 1700 [IU]/h via INTRAVENOUS
  Filled 2015-02-27 (×2): qty 250

## 2015-02-27 MED ORDER — FUROSEMIDE 80 MG PO TABS
80.0000 mg | ORAL_TABLET | Freq: Two times a day (BID) | ORAL | Status: DC
  Administered 2015-02-27 – 2015-02-28 (×2): 80 mg via ORAL
  Filled 2015-02-27 (×2): qty 1

## 2015-02-27 MED ORDER — WARFARIN SODIUM 3 MG PO TABS
6.0000 mg | ORAL_TABLET | Freq: Once | ORAL | Status: AC
  Administered 2015-02-27: 6 mg via ORAL
  Filled 2015-02-27: qty 2

## 2015-02-27 NOTE — Care Management Important Message (Signed)
Important Message  Patient Details  Name: Christopher [name] MRN: [id number] Date of Birth: [birth year]   Medicare Important Message Given:  Yes    [name] [name] 02/27/2015, 3:04 PM

## 2015-02-27 NOTE — Progress Notes (Signed)
ANTICOAGULATION CONSULT NOTE - Follow Up Consult  Pharmacy Consult for Heparin Indication: atrial fibrillation  Allergies  Allergen Reactions  . Crestor [Rosuvastatin] Other (See Comments)    Aches in joint   . Lipitor [Atorvastatin] Other (See Comments)    Joint aching  . Penicillins Rash  . Tramadol Nausea Only  . Vicodin [Hydrocodone-Acetaminophen] Anxiety    "Made me nervous"    Patient Measurements: Height: 6\' 1"  (185.4 cm) Weight: 227 lb 4.8 oz (103.103 kg) (Scale A) IBW/kg (Calculated) : 79.9 Heparin Dosing Weight: 101 kg  Vital Signs: Temp: 97.5 F (36.4 C) (12/19 1630) Temp Source: Oral (12/19 1434) BP: 120/58 mmHg (12/19 1630) Pulse Rate: 64 (12/19 1630)  Labs:  Recent Labs  02/25/15 0520 02/26/15 0207 02/27/15 0324 02/27/15 1840  HGB 8.6* 9.3* 9.3*  --   HCT 28.3* 30.3* 30.1*  --   PLT 233 263 265  --   LABPROT 25.2* 23.0* 21.9*  --   INR 2.31* 2.06* 1.92*  --   HEPARINUNFRC  --   --   --  0.13*  CREATININE 3.57* 3.88* 3.59*  --     Estimated Creatinine Clearance: 18.6 mL/min (by C-G formula based on Cr of 3.59).   Medications:  Heparin @ 1450 units/hr (14.5 ml/hr)  Assessment: 57 YOM who was admitted with HF exacerbation. Warfarin resumed from PTA and since INR<2 earlier today, heparin drip was started for bridging. The patient is s/p electrical cardioversion today.  Heparin drip this evening is SUBtherapeutic (HL 0.13, goal of 0.3-0.7). No repeat CBC, no overt s/sx of bleeding noted.   Goal of Therapy:  Heparin level 0.3-0.7 units/ml Monitor platelets by anticoagulation protocol: Yes   Plan:  1. Increase heparin to 1700 units/hr (17 ml/hr) 2. Will continue to monitor for any signs/symptoms of bleeding and will follow up with heparin level in 8 hours   Alycia Rossetti, PharmD, BCPS Clinical Pharmacist Pager: [phone] 02/27/2015 8:08 PM

## 2015-02-27 NOTE — Anesthesia Preprocedure Evaluation (Addendum)
Anesthesia Evaluation  Patient identified by MRN, date of birth, ID band Patient awake    Reviewed: Allergy & Precautions, H&P , NPO status , Patient's Chart, lab work & pertinent test results, reviewed documented beta blocker date and time   Airway Mallampati: II  TM Distance: >3 FB Neck ROM: Full    Dental no notable dental hx. (+) Teeth Intact, Dental Advisory Given   Pulmonary neg pulmonary ROS, former smoker,    Pulmonary exam normal breath sounds clear to auscultation       Cardiovascular hypertension, Pt. on medications and Pt. on home beta blockers + CAD, + CABG and +CHF  + pacemaker  Rhythm:Irregular Rate:Normal     Neuro/Psych negative neurological ROS  negative psych ROS   GI/Hepatic negative GI ROS, Neg liver ROS,   Endo/Other  negative endocrine ROS  Renal/GU Renal InsufficiencyRenal disease  negative genitourinary   Musculoskeletal   Abdominal   Peds  Hematology negative hematology ROS (+)   Anesthesia Other Findings   Reproductive/Obstetrics negative OB ROS                            Anesthesia Physical Anesthesia Plan  ASA: III  Anesthesia Plan: General   Post-op Pain Management:    Induction: Intravenous  Airway Management Planned: Mask  Additional Equipment:   Intra-op Plan:   Post-operative Plan:   Informed Consent: I have reviewed the patients History and Physical, chart, labs and discussed the procedure including the risks, benefits and alternatives for the proposed anesthesia with the patient or authorized representative who has indicated his/her understanding and acceptance.   Dental advisory given  Plan Discussed with: CRNA  Anesthesia Plan Comments:         Anesthesia Quick Evaluation

## 2015-02-27 NOTE — Transfer of Care (Signed)
Immediate Anesthesia Transfer of Care Note  Patient: Christopher [name]  Procedure(s) Performed: Procedure(s): CARDIOVERSION (N/A)  Patient Location: Endoscopy Unit  Anesthesia Type:General  Level of Consciousness: awake, alert  and oriented  Airway & Oxygen Therapy: Patient Spontanous Breathing and Patient connected to face mask oxygen  Post-op Assessment: Report given to RN and Post -op Vital signs reviewed and stable  Post vital signs: Reviewed and stable  Last Vitals:  Filed Vitals:   02/27/15 1123 02/27/15 1337  BP: 116/68 115/58  Pulse: 66 60  Temp: 36.9 C 36.8 C  Resp: 16 17    Complications: No apparent anesthesia complications

## 2015-02-27 NOTE — CV Procedure (Signed)
Electrical Cardioversion Procedure Note  Christopher [name]   81 y.o. male MRN: [id number] DOB: Nov 03, 1926  Today's date: 02/27/2015  Procedure: Electrical Cardioversion  Indications:  Atrial Fibrillation  Time Out: Verified patient identification, verified procedure,medications/allergies/relevent history reviewed, required imaging and test results available.  Performed  Procedure Details  The patient was NPO after midnight. Anesthesia was administered at the beside  by Dr.Fitzgerald  with 60 mg of lidocaine and 50 mg of propofol.  Cardioversion was done with synchronized biphasic defibrillation with AP pads with 120 watts.  The patient converted to normal sinus rhythm that was verified by pacer interrogation. The patient tolerated the procedure well   IMPRESSION:  Successful cardioversion of atrial fibrillation    W. Tollie Eth, Brooke Bonito. MD [facility]   02/27/2015, 2:25 PM

## 2015-02-27 NOTE — Progress Notes (Signed)
ANTICOAGULATION CONSULT NOTE - Follow Up Consult  Pharmacy Consult for Warfarin / Heparin Indication: atrial fibrillation  Allergies  Allergen Reactions  . Crestor [Rosuvastatin] Other (See Comments)    Aches in joint   . Lipitor [Atorvastatin] Other (See Comments)    Joint aching  . Penicillins Rash  . Tramadol Nausea Only  . Vicodin [Hydrocodone-Acetaminophen] Anxiety    "Made me nervous"   Vital Signs: Temp: 98 F (36.7 C) (12/19 0534) Temp Source: Oral (12/19 0534) BP: 104/50 mmHg (12/19 0534) Pulse Rate: 64 (12/19 0534)  Labs:  Recent Labs  02/25/15 0520 02/26/15 0207 02/27/15 0324  HGB 8.6* 9.3* 9.3*  HCT 28.3* 30.3* 30.1*  PLT 233 263 265  LABPROT 25.2* 23.0* 21.9*  INR 2.31* 2.06* 1.92*  CREATININE 3.57* 3.88* 3.59*    Estimated Creatinine Clearance: 18.6 mL/min (by C-G formula based on Cr of 3.59).  Assessment: 88yom on coumadin pta for afib, being admitted with SOB, weight gain, and leg swelling 2/2 acute heart failure. Coumadin to continue. INR on admission therapeutic at 2.44. He was taking amiodarone 100mg  daily pta but dose has been increased to 200mg  daily so will need to watch for drug interaction. Plans noted for cardioversion on 12/19.   Home dose: 4mg  daily except 2mg  on Friday  Current INR 1.92, CBC stable, adding heparin today  Goal of Therapy:  INR 2-3 Monitor platelets by anticoagulation protocol: Yes   Plan:  Heparin 2000 units iv bolus x 1 Heparin drip at 1450 units / hr Coumadin 6 mg po x 1 dose today 8 hour heparin level Daily heparin level, CBC, INR  Thank you Anette Guarneri, PharmD [phone]   02/27/2015 8:54 AM

## 2015-02-27 NOTE — Progress Notes (Signed)
S: Breathing better O:BP 104/50 mmHg  Pulse 64  Temp(Src) 98 F (36.7 C) (Oral)  Resp 16  Ht 6\' 1"  (1.854 m)  Wt 103.103 kg (227 lb 4.8 oz)  BMI 30.00 kg/m2  SpO2 91%  Intake/Output Summary (Last 24 hours) at 02/27/15 0936 Last data filed at 02/27/15 0900  Gross per 24 hour  Intake 710.17 ml  Output   1851 ml  Net -1140.83 ml   Weight change: -1.179 kg (-2 lb 9.6 oz) [id number] and alert CVS: Irreg, irreg Resp:Crackles Rt base Abd:+ BS NTND Ext: No edema NEURO:CNI Ox3 no asterixis   . amiodarone  200 mg Oral Daily  . atenolol  25 mg Oral q morning - 10a  . darbepoetin (ARANESP) injection - NON-DIALYSIS  60 mcg Subcutaneous Q Sat-1800  . ferumoxytol  510 mg Intravenous Weekly  . fluticasone  1 spray Each Nare QHS  . furosemide  40 mg Intravenous Daily  . glimepiride  1 mg Oral QAC breakfast  . senna-docusate  2 tablet Oral BID  . sevelamer carbonate  1,600 mg Oral TID WC  . sodium chloride  3 mL Intravenous Q12H  . tamsulosin  0.4 mg Oral Daily  . [START ON 03/03/2015] warfarin  2 mg Oral Q Fri-1800  . warfarin  4 mg Oral Once per day on Sun Mon Tue Wed Thu Sat  . warfarin  6 mg Oral ONCE-1800  . Warfarin - Pharmacist Dosing Inpatient   Does not apply q1800   No results found. BMET    Component Value Date/Time   NA 139 02/27/2015 0324   K 3.4* 02/27/2015 0324   CL 95* 02/27/2015 0324   CO2 33* 02/27/2015 0324   GLUCOSE 92 02/27/2015 0324   BUN 86* 02/27/2015 0324   CREATININE 3.59* 02/27/2015 0324   CALCIUM 8.8* 02/27/2015 0324   GFRNONAA 14* 02/27/2015 0324   GFRAA 16* 02/27/2015 0324   CBC    Component Value Date/Time   WBC 7.1 02/27/2015 0324   RBC 3.08* 02/27/2015 0324   HGB 9.3* 02/27/2015 0324   HCT 30.1* 02/27/2015 0324   PLT 265 02/27/2015 0324   MCV 97.7 02/27/2015 0324   MCH 30.2 02/27/2015 0324   MCHC 30.9 02/27/2015 0324   RDW 17.3* 02/27/2015 0324   LYMPHSABS 0.9 02/23/2015 1135   MONOABS 0.4 02/23/2015 1135   EOSABS 0.0 02/23/2015  1135   BASOSABS 0.0 02/23/2015 1135     Assessment: 1. Acute on CKD 4, baseline Scr low to mid 3"S 2. A fib 3. Anemia and iron def, Received IV iron and aranesp started 4. Mild hypokalemia  Plan: 1. For cardioversion today 2. Daily Scr 3. Will get info from office 4. I think his lasix could be switched to PO 5. Replace K   Staci Dack T

## 2015-02-27 NOTE — Progress Notes (Signed)
Labs this am: INR: 1.92, K 3.4, Creatinine:3.59, Hgb:9.3, Thanks, Arvella Nigh RN

## 2015-02-27 NOTE — Progress Notes (Signed)
Subjective:  Weight down 14 pounds since admission.  Significant diuresis over the weekend.  Continues however to cough.  Says his breathing is somewhat better but still dyspneic with exertion.  Still appears to be in atrial fibrillation.  Creatinine has come down somewhat.  Unfortunately the INR dipped under 2 today.  Objective:  Vital Signs in the last 24 hours: BP 104/50 mmHg  Pulse 64  Temp(Src) 98 F (36.7 C) (Oral)  Resp 16  Ht 6\' 1"  (1.854 m)  Wt 103.103 kg (227 lb 4.8 oz)  BMI 30.00 kg/m2  SpO2 91%  Physical Exam: Elderly white male in no acute distress lying in bed  Lungs:  Clear  Cardiac:  Regular rhythm, normal S1 and S2, no S3 Abdomen:  Soft, nontender, no masses Extremities:  Trace edema present  Intake/Output from previous day: 12/18 0701 - 12/19 0700 In: 883 [P.O.:880; I.V.:3] Out: 2201 [Urine:2200; Stool:1] Weight Filed Weights   02/25/15 0413 02/26/15 0629 02/27/15 0534  Weight: 106.7 kg (235 lb 3.7 oz) 104.282 kg (229 lb 14.4 oz) 103.103 kg (227 lb 4.8 oz)    Lab Results: Basic Metabolic Panel:  Recent Labs  02/26/15 0207 02/27/15 0324  NA 140 139  K 3.8 3.4*  CL 95* 95*  CO2 35* 33*  GLUCOSE 90 92  BUN 84* 86*  CREATININE 3.88* 3.59*    CBC:  Recent Labs  02/26/15 0207 02/27/15 0324  WBC 7.0 7.1  HGB 9.3* 9.3*  HCT 30.3* 30.1*  MCV 98.4 97.7  PLT 263 265    BNP    Component Value Date/Time   BNP 877.8* 02/23/2015 1135    PROTIME: Lab Results  Component Value Date   INR 1.92* 02/27/2015   INR 2.06* 02/26/2015   INR 2.31* 02/25/2015    Telemetry: Paced rhythm, underlying rhythm is atrial fibrillation  Assessment/Plan:  1.  Acute on chronic diastolic heart failure with 14 pound diuresis  2.  Recurrent atrial fibrillation 3.  Functioning pacemaker 4.  Acute on chronic renal failure with slight increase in creatinine today 5.  Cough questionable due to amiodarone or heart failure  Recommendations:  Cardioversion is  planned for this afternoon.  Unfortunately INR is less than 2 but since he has been therapeutic up until this morning we'll plan to cover with IV heparin and then continue heparin until INR is again greater than 2.  I'm going to go ahead and plan for the cardioversion with heparin coverage.  Cardioversion again discussed with patient.      Kerry Hough  MD [facility]  02/27/2015, 8:51 AM

## 2015-02-27 NOTE — Evaluation (Signed)
Occupational Therapy Evaluation Patient Details Name: Christopher [name] MRN: [id number] DOB: 06-Aug-1926 Today's Date: 02/27/2015    History of Present Illness Pt is an 81 y/o male with weight gain and increasing SOB, admitted for CHF and renal work up.   Clinical Impression   Patient presenting with deconditioning and decreased ADL & functional mobility independence. Patient mod I PTA. Patient currently functioning at an overall min assist level. Patient will benefit from acute OT to increase overall independence in the areas of ADLs, functional mobility, and overall safety in order to safely discharge home with assistance prn from wife.     Follow Up Recommendations  No OT follow up;Supervision/Assistance - 24 hour    Equipment Recommendations  None recommended by OT    Recommendations for Other Services  None at this time     Precautions / Restrictions Precautions Precautions: Fall Restrictions Weight Bearing Restrictions: No Other Position/Activity Restrictions: pt with h/o R hip surgery, uses RW for mobility     Mobility Bed Mobility Overal bed mobility: Needs Assistance Bed Mobility: Supine to Sit     Supine to sit: Supervision     General bed mobility comments: HOB down and minimal use of bed rails, supervision for safety.   Transfers Overall transfer level: Needs assistance Equipment used: Rolling walker (2 wheeled) Transfers: Sit to/from Stand Sit to Stand: Min assist General transfer comment: Min assist to help ower up and to steady, cues for technique. Increased time needed.     Balance Overall balance assessment: Needs assistance Sitting-balance support: No upper extremity supported;Feet supported Sitting balance-Leahy Scale: Good     Standing balance support: Bilateral upper extremity supported;During functional activity Standing balance-Leahy Scale: Fair    ADL Overall ADL's : Needs assistance/impaired General ADL Comments: Pt requires assistance for  LB ADLs due to SOB and decreased ROM/mobility secondary to recent R hip surgery. Pt able to perform functional mobility using RW with min guard assist to supervision. Pt does require min assist for power up into standing from lower surfaces.     Pertinent Vitals/Pain Pain Assessment: No/denies pain     Hand Dominance Right   Extremity/Trunk Assessment Upper Extremity Assessment Upper Extremity Assessment: Overall WFL for tasks assessed   Lower Extremity Assessment Lower Extremity Assessment: Defer to PT evaluation   Cervical / Trunk Assessment Cervical / Trunk Assessment: Kyphotic   Communication Communication Communication: No difficulties   Cognition Arousal/Alertness: Awake/alert Behavior During Therapy: WFL for tasks assessed/performed Overall Cognitive Status: Within Functional Limits for tasks assessed              Home Living Family/patient expects to be discharged to:: Private residence Living Arrangements: Spouse/significant other Available Help at Discharge: Family;Available 24 hours/day Type of Home: House Home Access: Ramped entrance     Home Layout: One level     Bathroom Shower/Tub: Tub/shower unit;Curtain   Biochemist, clinical: Standard     Home Equipment: Environmental consultant - 4 wheels;Bedside commode;Shower seat   Prior Functioning/Environment Level of Independence: Independent with assistive device(s)  Comments: uses 4WW    OT Diagnosis: Generalized weakness   OT Problem List: Decreased strength;Decreased activity tolerance;Impaired balance (sitting and/or standing);Cardiopulmonary status limiting activity;Decreased range of motion   OT Treatment/Interventions: Self-care/ADL training;Therapeutic exercise;Energy conservation;DME and/or AE instruction;Therapeutic activities;Patient/family education;Balance training    OT Goals(Current goals can be found in the care plan section) Acute Rehab OT Goals Patient Stated Goal: get home OT Goal Formulation: With  patient/family Time For Goal Achievement: 03/13/15 Potential to Achieve Goals:  Good ADL Goals Pt Will Perform Grooming: with modified independence;standing Pt Will Perform Lower Body Bathing: with modified independence;sit to/from stand;with adaptive equipment Pt Will Perform Lower Body Dressing: with modified independence;sit to/from stand;with adaptive equipment Pt Will Transfer to Toilet: with modified independence;ambulating Additional ADL Goal #1: Pt will be educated on energy conservation techniques and pt will be able to verbalize at least 3 techniques independently  Additional ADL Goal #2: Pt will be mod I with functional mobility using RW  OT Frequency: Min 2X/week   Barriers to D/C: none known at this time   End of Session Equipment Utilized During Treatment: Gait belt;Rolling walker Nurse Communication: Mobility status;Other (comment) (patient's 02 sats)  Activity Tolerance: Patient tolerated treatment well Patient left: in chair;with call bell/phone within reach;with nursing/sitter in room;with chair alarm set;with family/visitor present   Time: 1035-1056 OT Time Calculation (min): 21 min Charges:  OT General Charges $OT Visit: 1 Procedure OT Evaluation $Initial OT Evaluation Tier I: 1 Procedure  Christopher [name] , MS, OTR/L, CLT Pager: [phone]  02/27/2015, 11:13 AM

## 2015-02-27 NOTE — Anesthesia Postprocedure Evaluation (Signed)
Anesthesia Post Note  Patient: Christopher [name]  Procedure(s) Performed: Procedure(s) (LRB): CARDIOVERSION (N/A)  Patient location during evaluation: Endoscopy Anesthesia Type: General Level of consciousness: awake, awake and alert and oriented Pain management: satisfactory to patient Vital Signs Assessment: post-procedure vital signs reviewed and stable Respiratory status: spontaneous breathing Cardiovascular status: blood pressure returned to baseline and stable Postop Assessment: no signs of nausea or vomiting Anesthetic complications: no    Last Vitals:  Filed Vitals:   02/27/15 1123 02/27/15 1337  BP: 116/68 115/58  Pulse: 66 60  Temp: 36.9 C 36.8 C  Resp: 16 17    Last Pain:  Filed Vitals:   02/27/15 1340  PainSc: 0-No pain                 Clearnce Sorrel

## 2015-02-27 NOTE — Progress Notes (Signed)
Pt in endoscopic procedure and will attempt to see again as time allows.  Mee Hives, PT MS Acute Rehab Dept. Number: [facility] [id number] and [place] [phone]

## 2015-02-28 ENCOUNTER — Encounter (HOSPITAL_COMMUNITY): Payer: Self-pay | Admitting: Cardiology

## 2015-02-28 LAB — PROTIME-INR
INR: 2.05 — ABNORMAL HIGH (ref 0.00–1.49)
Prothrombin Time: 23 seconds — ABNORMAL HIGH (ref 11.6–15.2)

## 2015-02-28 LAB — RENAL FUNCTION PANEL
Albumin: 3 g/dL — ABNORMAL LOW (ref 3.5–5.0)
Anion gap: 10 (ref 5–15)
BUN: 88 mg/dL — ABNORMAL HIGH (ref 6–20)
CALCIUM: 8.8 mg/dL — AB (ref 8.9–10.3)
CO2: 30 mmol/L (ref 22–32)
CREATININE: 3.38 mg/dL — AB (ref 0.61–1.24)
Chloride: 100 mmol/L — ABNORMAL LOW (ref 101–111)
GFR, EST AFRICAN AMERICAN: 18 mL/min — AB (ref >60)
GFR, EST NON AFRICAN AMERICAN: 15 mL/min — AB (ref >60)
Glucose, Bld: 99 mg/dL (ref 65–99)
Phosphorus: 3.4 mg/dL (ref 2.5–4.6)
Potassium: 4.4 mmol/L (ref 3.5–5.1)
SODIUM: 140 mmol/L (ref 135–145)

## 2015-02-28 LAB — FOLATE RBC
Folate, Hemolysate: >620 ng/mL
Folate, RBC: >2109 ng/mL (ref >498)
Hematocrit: 29.4 % — ABNORMAL LOW (ref 37.5–51.0)

## 2015-02-28 LAB — CBC
HCT: 29.2 % — ABNORMAL LOW (ref 39.0–52.0)
Hemoglobin: 9 g/dL — ABNORMAL LOW (ref 13.0–17.0)
MCH: 30.2 pg (ref 26.0–34.0)
MCHC: 30.8 g/dL (ref 30.0–36.0)
MCV: 98 fL (ref 78.0–100.0)
PLATELETS: 278 10*3/uL (ref 150–400)
RBC: 2.98 MIL/uL — AB (ref 4.22–5.81)
RDW: 17.6 % — ABNORMAL HIGH (ref 11.5–15.5)
WBC: 7.1 10*3/uL (ref 4.0–10.5)

## 2015-02-28 LAB — GLUCOSE, CAPILLARY
GLUCOSE-CAPILLARY: 96 mg/dL (ref 65–99)
Glucose-Capillary: 79 mg/dL (ref 65–99)

## 2015-02-28 LAB — HEPARIN LEVEL (UNFRACTIONATED)

## 2015-02-28 MED ORDER — SEVELAMER CARBONATE 800 MG PO TABS: 1600.0000 mg | ORAL_TABLET | Freq: Three times a day (TID) | ORAL | Status: DC

## 2015-02-28 MED ORDER — AMIODARONE HCL 200 MG PO TABS: 200.0000 mg | ORAL_TABLET | Freq: Every morning | ORAL | Status: DC

## 2015-02-28 MED ORDER — FUROSEMIDE 80 MG PO TABS: 80.0000 mg | ORAL_TABLET | Freq: Two times a day (BID) | ORAL | Status: DC

## 2015-02-28 MED ORDER — WARFARIN SODIUM 4 MG PO TABS: 2.0000 mg | ORAL_TABLET | Freq: Every day | ORAL | Status: DC

## 2015-02-28 NOTE — Progress Notes (Signed)
Nutrition Education Note  RD consulted for low phosphorus education. Provided low phosphorus handouts to patient. Explained why diet restrictions are needed and provided lists of foods to limit/avoid that are high in phosphorus. Provided specific recommendations on safer alternatives of these foods.   Expect good compliance.  Body mass index is 29.78 kg/(m^2). Pt meets criteria for obesity based on current BMI.  Patient is being discharged today.  Molli Barrows, RD, LDN, [place] Pager [phone] After Hours Pager [phone]

## 2015-02-28 NOTE — Progress Notes (Signed)
ANTICOAGULATION CONSULT NOTE - Follow Up Consult  Pharmacy Consult for Warfarin / Heparin Indication: atrial fibrillation  Allergies  Allergen Reactions  . Crestor [Rosuvastatin] Other (See Comments)    Aches in joint   . Lipitor [Atorvastatin] Other (See Comments)    Joint aching  . Penicillins Rash  . Tramadol Nausea Only  . Vicodin [Hydrocodone-Acetaminophen] Anxiety    "Made me nervous"   Vital Signs: Temp: 97.9 F (36.6 C) (12/20 0428) Temp Source: Oral (12/20 0428) BP: 94/51 mmHg (12/20 0428) Pulse Rate: 61 (12/20 0428)  Labs:  Recent Labs  02/26/15 0207 02/27/15 0324 02/27/15 1840 02/28/15 0430 02/28/15 0438  HGB 9.3* 9.3*  --   --  9.0*  HCT 30.3* 30.1*  --   --  29.2*  PLT 263 265  --   --  278  LABPROT 23.0* 21.9*  --   --  23.0*  INR 2.06* 1.92*  --   --  2.05*  HEPARINUNFRC  --   --  0.13*  --  <0.10*  CREATININE 3.88* 3.59*  --  3.38*  --     Estimated Creatinine Clearance: 19.7 mL/min (by C-G formula based on Cr of 3.38).  Assessment: 88yom on coumadin pta for afib, being admitted with SOB, weight gain, and leg swelling 2/2 acute heart failure. Coumadin to continue. INR on admission therapeutic at 2.44. He was taking amiodarone 100mg  daily pta but dose has been increased to 200mg  daily so will need to watch for drug interaction. Pt INR fell to < 2 and heparin was started. Successful DCCV on 12/19. HL today was SUBtherapeutic but INR is now >2 and patient is in NSR. Will discontinue heparin at this time.   Home dose: 4mg  daily except 2mg  on Friday  Current INR 2.05, CBC stable  Goal of Therapy:  INR 2-3 Monitor platelets by anticoagulation protocol: Yes   Plan:  Discontinue heparin  Coumadin 4 mg po x 1 dose today Daily CBC, INR  Neveah Bang C. Lennox Grumbles, PharmD Pharmacy Resident  Pager: [phone] 02/28/2015 8:31 AM

## 2015-02-28 NOTE — Progress Notes (Signed)
Occupational Therapy Treatment and Discharge Patient Details Name: Christopher [name] MRN: [id number] DOB: 1926/05/06 Today's Date: 02/28/2015    History of present illness Pt is an 81 y/o male with weight gain and increasing SOB, admitted for CHF and renal work up.   OT comments  This pt is overall now at a S level and will have this at home as well as prn A. No further OT needs, we will sign off.  Follow Up Recommendations  No OT follow up;Supervision - Intermittent    Equipment Recommendations  None recommended by OT       Precautions / Restrictions Precautions Precautions: Fall Restrictions Weight Bearing Restrictions: No Other Position/Activity Restrictions: pt with h/o R hip surgery (ORIF April 2016), uses RW for mobility        Mobility Bed Mobility Overal bed mobility: Modified Independent Bed Mobility: Supine to Sit           General bed mobility comments: HOB up and use of rails  Transfers Overall transfer level: Needs assistance Equipment used: Rolling walker (2 wheeled) Transfers: Sit to/from Stand Sit to Stand: Supervision         General transfer comment: Pt ambulated 20 feet with RW and S         ADL Overall ADL's : Needs assistance/impaired     Grooming: Wash/dry hands;Oral care;Supervision/safety;Standing                   Toilet Transfer: Supervision/safety;Ambulation;RW;Comfort height toilet;Grab bars   Toileting- Clothing Manipulation and Hygiene: Supervision/safety;Sit to/from stand         General ADL Comments: Pt has AE at home from recent hip surgery and his wife to A him prn as well. Went over and issued pt/wife energy conservation handout.                Cognition   Behavior During Therapy: WFL for tasks assessed/performed Overall Cognitive Status: Within Functional Limits for tasks assessed                                    Pertinent Vitals/ Pain       Pain Assessment:  (soreness in right  hip)         Frequency Min 2X/week     Progress Toward Goals  OT Goals(current goals can now be found in the care plan section)  Progress towards OT goals:  (see clinical impression statement)     Plan Discharge plan remains appropriate       End of Session Equipment Utilized During Treatment: Rolling walker   Activity Tolerance Patient tolerated treatment well   Patient Left in chair;with call bell/phone within reach;with chair alarm set;with family/visitor present   Nurse Communication  (O2 sats 94-95% on RA (RN agreed we could leave O2 off); pf and wife would like a nutrionist consult for renal diet recommendations)        Time: [id number] OT Time Calculation (min): 35 min  Charges: OT General Charges $OT Visit: 1 Procedure OT Treatments $Self Care/Home Management : 23-37 mins  Christopher [name] [id number] 02/28/2015, 10:30 AM

## 2015-02-28 NOTE — Discharge Summary (Signed)
Physician Discharge Summary  Patient ID: Christopher [name] MRN: [id number] DOB/AGE: 08-05-1926 81 y.o.  Admit date: 02/23/2015 Discharge date: 02/28/2015  Primary Physician:  Dr. Dagmar Hait  Primary Discharge Diagnosis:  1.  Acute on chronic diastolic congestive heart failure  Secondary Discharge Diagnosis: 2.  Paroxysmal atrial fibrillation with recurrence this admission 3.  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  Coronary artery disease with previous bypass grafting 5.  Functioning permanent pacemaker 6.  Long-term use of anticoagulation 7.  Hyperlipidemia 8.  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  Amiodarone therapy with chronic cough 10.  Anemia of chronic kidney disease  Procedures:  Cardioversion, 2-D echocardiogram  Consults:  Nephrology  Hospital Course: This 81 year old male has a history of hypertensive heart disease coronary artery disease with previous bypass grafting and permanent pacemaker implanted for chronotropic incompetence in the past. He has chronic diastolic heart failure as well as stage IV chronic kidney disease. He has recently had some difficulties with a hip fracture as well as severe low back pain. He has had deterioration in his renal function. He has had a 20 pound weight gain in the past few months and over the past 6 weeks is had a 7 pound weight gain. He went to the emergency room earlier this week and had increasing shortness of breath and was given a shot of Lasix and sent home. He saw me in the office the day prior to admission and was noted to have gone into atrial fibrillation by pacemaker interrogation on approximately December 12. He has a prior history of atrial fibrillation and has been cardioverted twice previously. He has been on chronic amiodarone therapy but developed a cough earlier this year and because of the cough is amiodarone dosage was reduced to 100 mg daily. He has  continued to cough. When seen in the office the day prior to admission he was given 160 mg of Lasix to take by mouth with increasing his dose but despite this had set continued shortness of breath and cough and was miserable and came to the emergency room today. He is admittedfor intravenous diuresis and renal consultation. He was also felt that he might need to have cardioversion.  Hospital course: He was placed on intravenous furosemide and diuresed from a weight of 241 down to 225 pounds.  There was some increase in his creatinine with this.  He was seen by the nephrologists and it was felt that he had the anemia of chronic kidney disease and would need EPO as an outpatient.  In addition he was to be given intravenous iron as an outpatient.  It was felt that the atrial fibrillation has contributed to his symptoms and he continued to cough.  His amiodarone was increased to 200 mg twice daily and Underwent cardioversion with reversion to sinus rhythm on 12/19.  His overall sense of well-being improved greatly after cardioversion and he was able to be more ambulatory and noted marked diminution in his cough.  He was noted to have sinus bradycardia following that and his pacemaker was reprogrammed to 70 bpm on discharge.  He had not been following fluid restriction or weighing daily on discharge and it was recommended that he call us if his weight goes up by more than 3 pounds and that he be discharged on furosemide 80 mg twice daily.  He is to continue his other medications.  Already gram done previously in the office showed LVH with an ejection fraction  of 60%.  He had marked left atrial enlargement.  Discharge Exam: Blood pressure 104/55, pulse 57, temperature 98.4 F (36.9 C), temperature source Oral, resp. rate 18, height 6\' 1"  (1.854 m), weight 102.377 kg (225 lb 11.2 oz), SpO2 95 %. Weight: 102.377 kg (225 lb 11.2 oz) (scale a) No edema present, lungs were clear on discharge Labs: CBC:   Lab  Results  Component Value Date   WBC 7.1 02/28/2015   HGB 9.0* 02/28/2015   HCT 29.2* 02/28/2015   MCV 98.0 02/28/2015   PLT 278 02/28/2015   CMP:  Recent Labs Lab 02/23/15 1724  02/28/15 0430  NA 142  < > 140  K 3.7  < > 4.4  CL 105  < > 100*  CO2 29  < > 30  BUN 66*  < > 88*  CREATININE 3.31*  < > 3.38*  CALCIUM 9.0  < > 8.8*  PROT 5.8*  --   --   BILITOT 0.8  --   --   ALKPHOS 49  --   --   ALT 36  --   --   AST 34  --   --   GLUCOSE 98  < > 99  < > = values in this interval not displayed.  Lipid Panel     Component Value Date/Time   CHOL 218* 06/29/2014 0550   TRIG 186* 06/29/2014 0550   HDL 39* 06/29/2014 0550   CHOLHDL 5.6 06/29/2014 0550   VLDL 37 06/29/2014 0550   LDLCALC 142* 06/29/2014 0550   BNP (last 3 results) BNP    Component Value Date/Time   BNP 877.8* 02/23/2015 1135    ProBNP    Component Value Date/Time   PROBNP 9261.0* 07/24/2013 0120   Protime: Lab Results  Component Value Date   INR 2.05* 02/28/2015   INR 1.92* 02/27/2015   INR 2.06* 02/26/2015   Hemoglobin A1C: Lab Results  Component Value Date   HGBA1C 6.1* 06/29/2014    Radiology: Pulmonary vascular congestion, cardiomegaly, functioning permanent pacemaker  EKG: Initially atrial fibrillation with occasional paced beats, following cardioversion he is atrial paced with conduction  Discharge Medications:   Medication List    TAKE these medications        Albuterol Sulfate 108 (90 BASE) MCG/ACT Aepb  Commonly known as:  PROAIR RESPICLICK  Inhale 2 puffs into the lungs at bedtime.     amiodarone 200 MG tablet  Commonly known as:  PACERONE  Take 1 tablet (200 mg total) by mouth every morning.     atenolol 25 MG tablet  Commonly known as:  TENORMIN  Take 25 mg by mouth every morning.     CALTRATE 600+D3 SOFT PO  Take 2 tablets by mouth 2 (two) times daily.     Fish Oil 1200 MG Caps  Take 1,200 mg by mouth daily.     fluticasone 50 MCG/ACT nasal spray   Commonly known as:  FLONASE  Place 1 spray into both nostrils at bedtime.     folic acid A999333 MCG tablet  Commonly known as:  FOLVITE  Take 400 mcg by mouth daily.     furosemide 80 MG tablet  Commonly known as:  LASIX  Take 1 tablet (80 mg total) by mouth 2 (two) times daily.     glimepiride 1 MG tablet  Commonly known as:  AMARYL  Take 1 tablet (1 mg total) by mouth daily before breakfast.     Glucosamine HCl 1500 MG Tabs  Take 1,500 mg by mouth 2 (two) times daily.     [place] 300 MG Caps  Take 1 capsule by mouth daily.     methocarbamol 500 MG tablet  Commonly known as:  ROBAXIN  Take 1 tablet (500 mg total) by mouth every 6 (six) hours as needed for muscle spasms.     multivitamin with minerals Tabs tablet  Take 1 tablet by mouth daily.     senna-docusate 8.6-50 MG tablet  Commonly known as:  Senokot-S  Take 2 tablets by mouth 2 (two) times daily.     sevelamer carbonate 800 MG tablet  Commonly known as:  RENVELA  Take 2 tablets (1,600 mg total) by mouth 3 (three) times daily with meals.     tamsulosin 0.4 MG Caps capsule  Commonly known as:  FLOMAX  Take 1 capsule by mouth daily.     warfarin 4 MG tablet  Commonly known as:  COUMADIN  Take 0.5-1 tablets (2-4 mg total) by mouth daily. Increase to 4 mg daily       Followup plans and appointments: Patient is to follow-up in one week for a ProTime as well as follow-up of congestive heart failure.  He is to follow-up with the renal doctors about weekly intravenous iron as well as erythropoietin.  Time spent with patient to include physician time:  45 minutes  Signed: W. Doristine [name]. MD [facility] 02/28/2015, 1:22 PM

## 2015-02-28 NOTE — Progress Notes (Signed)
S: Breathing better O:BP 94/51 mmHg  Pulse 61  Temp(Src) 97.9 F (36.6 C) (Oral)  Resp 18  Ht 6\' 1"  (1.854 m)  Wt 102.377 kg (225 lb 11.2 oz)  BMI 29.78 kg/m2  SpO2 92%  Intake/Output Summary (Last 24 hours) at 02/28/15 0919 Last data filed at 02/28/15 0825  Gross per 24 hour  Intake    920 ml  Output   1050 ml  Net   -130 ml   Weight change: -0.726 kg (-1 lb 9.6 oz) [id number] and alert CVS: Irreg, irreg Resp:Crackles Rt base Abd:+ BS NTND Ext: No edema NEURO:CNI Ox3 no asterixis   . amiodarone  200 mg Oral Daily  . atenolol  25 mg Oral q morning - 10a  . darbepoetin (ARANESP) injection - NON-DIALYSIS  60 mcg Subcutaneous Q Sat-1800  . ferumoxytol  510 mg Intravenous Weekly  . fluticasone  1 spray Each Nare QHS  . furosemide  80 mg Oral BID  . glimepiride  1 mg Oral QAC breakfast  . senna-docusate  2 tablet Oral BID  . sevelamer carbonate  1,600 mg Oral TID WC  . sodium chloride  3 mL Intravenous Q12H  . tamsulosin  0.4 mg Oral Daily  . [START ON 03/03/2015] warfarin  2 mg Oral Q Fri-1800  . warfarin  4 mg Oral Once per day on Sun Mon Tue Wed Thu Sat  . Warfarin - Pharmacist Dosing Inpatient   Does not apply q1800   No results found. BMET    Component Value Date/Time   NA 140 02/28/2015 0430   K 4.4 02/28/2015 0430   CL 100* 02/28/2015 0430   CO2 30 02/28/2015 0430   GLUCOSE 99 02/28/2015 0430   BUN 88* 02/28/2015 0430   CREATININE 3.38* 02/28/2015 0430   CALCIUM 8.8* 02/28/2015 0430   GFRNONAA 15* 02/28/2015 0430   GFRAA 18* 02/28/2015 0430   CBC    Component Value Date/Time   WBC 7.1 02/28/2015 0438   RBC 2.98* 02/28/2015 0438   HGB 9.0* 02/28/2015 0438   HCT 29.2* 02/28/2015 0438   PLT 278 02/28/2015 0438   MCV 98.0 02/28/2015 0438   MCH 30.2 02/28/2015 0438   MCHC 30.8 02/28/2015 0438   RDW 17.6* 02/28/2015 0438   LYMPHSABS 0.9 02/23/2015 1135   MONOABS 0.4 02/23/2015 1135   EOSABS 0.0 02/23/2015 1135   BASOSABS 0.0 02/23/2015 1135      Assessment: 1. Acute on CKD 4, Scr at baseline 2. A fib  SP cardioversion 3. Anemia and iron def, Received IV iron and aranesp started 4. Mild hypokalemia, resolved  Plan: 1. DC renvela for now 2. Will arrange outpt aranesp after DC 3. Daily Scr 4. Dietitian to see for low PO4 diet.  Aviv Lengacher T

## 2015-02-28 NOTE — Progress Notes (Signed)
Physical Therapy Treatment Patient Details Name: Christopher [name] MRN: [id number] DOB: 1926/05/21 Today's Date: 02/28/2015    History of Present Illness Pt is an 81 y/o male with weight gain and increasing SOB, admitted for CHF and renal work up.    PT Comments    Pt reports with his recent wgt loss is feeling much better, has decreased his fluid load creating SOB.  O2 sats were assessed and noted his O2 dropped with exercise from 93% to 91%.  Will continue to monitor with his PT treatments.  Follow Up Recommendations  Supervision/Assistance - 24 hour;Home health PT     Equipment Recommendations  Other (comment) (none recommended by this PT)    Recommendations for Other Services       Precautions / Restrictions Precautions Precautions: Fall Restrictions Weight Bearing Restrictions: No Other Position/Activity Restrictions: old R hip surgery    Mobility  Bed Mobility Overal bed mobility: Modified Independent Bed Mobility: Supine to Sit           General bed mobility comments: up when PT entered  Transfers Overall transfer level: Needs assistance Equipment used: Rolling walker (2 wheeled) Transfers: Sit to/from Stand Sit to Stand: Supervision         General transfer comment: Pt had just walked with OT  Ambulation/Gait             General Gait Details: just walked with OT   Stairs            Wheelchair Mobility    Modified Rankin (Stroke Patients Only)       Balance                                    Cognition Arousal/Alertness: Awake/alert Behavior During Therapy: WFL for tasks assessed/performed Overall Cognitive Status: Within Functional Limits for tasks assessed                      Exercises General Exercises - Lower Extremity Ankle Circles/Pumps: AROM;Both;5 reps (holding stretches) Quad Sets: Strengthening;Both;10 reps Gluteal Sets: Strengthening;Both;10 reps Long Arc Quad: Strengthening;Both;10  reps Heel Slides: Strengthening;Both;10 reps Hip ABduction/ADduction: AROM;Both;10 reps Straight Leg Raises: AROM;Both;10 reps Hip Flexion/Marching: AROM;Both;10 reps    General Comments General comments (skin integrity, edema, etc.): Pt was up in chair and tired from just having walked and declined to get back on the hall.  Agreed to ther exercise      Pertinent Vitals/Pain Pain Assessment:  (soreness in right hip)    Home Living                      Prior Function            PT Goals (current goals can now be found in the care plan section) Acute Rehab PT Goals Patient Stated Goal: get home Progress towards PT goals: Progressing toward goals    Frequency  Min 3X/week    PT Plan Current plan remains appropriate    Co-evaluation             End of Session Equipment Utilized During Treatment: Oxygen Activity Tolerance: Patient tolerated treatment well Patient left: in chair;with call bell/phone within reach;with chair alarm set;with family/visitor present;with nursing/sitter in room     Time:  -     Charges:  $Therapeutic Exercise: 23-37 mins  G CodesRamond Dial 02/28/2015, 12:06 PM   Mee Hives, PT MS Acute Rehab Dept. Number: [facility] [phone] and [place] [phone]

## 2015-03-07 DIAGNOSIS — Z79899 Other long term (current) drug therapy: Secondary | ICD-10-CM | POA: Diagnosis not present

## 2015-03-07 DIAGNOSIS — N184 Chronic kidney disease, stage 4 (severe): Secondary | ICD-10-CM | POA: Diagnosis not present

## 2015-03-07 DIAGNOSIS — Z951 Presence of aortocoronary bypass graft: Secondary | ICD-10-CM | POA: Diagnosis not present

## 2015-03-07 DIAGNOSIS — Z7901 Long term (current) use of anticoagulants: Secondary | ICD-10-CM | POA: Diagnosis not present

## 2015-03-07 DIAGNOSIS — Z950 Presence of cardiac pacemaker: Secondary | ICD-10-CM | POA: Diagnosis not present

## 2015-03-07 DIAGNOSIS — I2510 Atherosclerotic heart disease of native coronary artery without angina pectoris: Secondary | ICD-10-CM | POA: Diagnosis not present

## 2015-03-07 DIAGNOSIS — E1122 Type 2 diabetes mellitus with diabetic chronic kidney disease: Secondary | ICD-10-CM | POA: Diagnosis not present

## 2015-03-07 DIAGNOSIS — I5032 Chronic diastolic (congestive) heart failure: Secondary | ICD-10-CM | POA: Diagnosis not present

## 2015-03-07 DIAGNOSIS — R0602 Shortness of breath: Secondary | ICD-10-CM | POA: Diagnosis not present

## 2015-03-07 DIAGNOSIS — E785 Hyperlipidemia, unspecified: Secondary | ICD-10-CM | POA: Diagnosis not present

## 2015-03-07 DIAGNOSIS — I119 Hypertensive heart disease without heart failure: Secondary | ICD-10-CM | POA: Diagnosis not present

## 2015-03-07 DIAGNOSIS — I480 Paroxysmal atrial fibrillation: Secondary | ICD-10-CM | POA: Diagnosis not present

## 2015-03-09 ENCOUNTER — Telehealth: Payer: Self-pay | Admitting: Pulmonary Disease

## 2015-03-09 LAB — CBC WITH DIFFERENTIAL/PLATELET
BASOS ABS: 0 10*3/uL (ref 0.0–0.1)
Basophils Relative: 0 % (ref 0–1)
EOS ABS: 0 10*3/uL (ref 0.0–0.7)
EOS PCT: 0 % (ref 0–5)
HEMATOCRIT: 31.8 % — AB (ref 39.0–52.0)
Hemoglobin: 9.9 g/dL — ABNORMAL LOW (ref 13.0–17.0)
LYMPHS ABS: 0.8 10*3/uL (ref 0.7–4.0)
LYMPHS PCT: 6 % — AB (ref 12–46)
MCH: 29 pg (ref 26.0–34.0)
MCHC: 31.1 g/dL (ref 30.0–36.0)
MCV: 93.3 fL (ref 78.0–100.0)
MONOS PCT: 6 % (ref 3–12)
Monocytes Absolute: 0.8 10*3/uL (ref 0.1–1.0)
NEUTROS PCT: 88 % — AB (ref 43–77)
Neutro Abs: 11.8 10*3/uL — ABNORMAL HIGH (ref 1.7–7.7)
Platelets: 402 10*3/uL — ABNORMAL HIGH (ref 150–400)
RBC: 3.41 MIL/uL — ABNORMAL LOW (ref 4.22–5.81)
RDW: 17.1 % — ABNORMAL HIGH (ref 11.5–15.5)
WBC: 13.6 10*3/uL — AB (ref 4.0–10.5)

## 2015-03-09 NOTE — Telephone Encounter (Signed)
Called and spoke with Marzetta Board at [facility]. She explained that she called the pt to schedule the 6 month follow up CT for 06/2015. He told Marzetta Board that he had been admitted to the hospital and discharged on 02/28/15. Since discharge his symptoms had improved and he did not feel like the CT was necessary. I explained to her that I would call the patient and make sure to make RA aware.   Called and spoke with pt. He states that his breathing has improved and does not have any of the previous symptoms.  He also stated that his cardiologist told him that his condition had improved and no follow up was needed. I informed the patient that the follow up CT was for ILD follow up and the patient refused having the CT. I explained to him that I would send the message to RA to make him aware of his decision. Pt voiced understanding and had no further questions.   Will send message to RA for FYI

## 2015-03-13 NOTE — Telephone Encounter (Signed)
Okay Please schedule a 6 month routine office visit follow-up

## 2015-03-14 NOTE — Telephone Encounter (Signed)
Patient is on Recall list for his 6 month follow up appointment. Patient is aware that he is on recall list. Nothing further needed.

## 2015-03-16 DIAGNOSIS — S72001A Fracture of unspecified part of neck of right femur, initial encounter for closed fracture: Secondary | ICD-10-CM | POA: Diagnosis not present

## 2015-03-16 DIAGNOSIS — M545 Low back pain: Secondary | ICD-10-CM | POA: Diagnosis not present

## 2015-03-20 ENCOUNTER — Ambulatory Visit (HOSPITAL_COMMUNITY)
Admission: RE | Admit: 2015-03-20 | Discharge: 2015-03-20 | Disposition: A | Discharge status: Home / Self Care | Payer: Medicare Other | Source: Ambulatory Visit | Attending: Nephrology | Admitting: Nephrology

## 2015-03-20 DIAGNOSIS — D631 Anemia in chronic kidney disease: Secondary | ICD-10-CM | POA: Diagnosis not present

## 2015-03-20 DIAGNOSIS — Z79899 Other long term (current) drug therapy: Secondary | ICD-10-CM | POA: Diagnosis not present

## 2015-03-20 DIAGNOSIS — N184 Chronic kidney disease, stage 4 (severe): Secondary | ICD-10-CM | POA: Diagnosis not present

## 2015-03-20 DIAGNOSIS — Z5181 Encounter for therapeutic drug level monitoring: Secondary | ICD-10-CM | POA: Diagnosis not present

## 2015-03-20 LAB — POCT HEMOGLOBIN-HEMACUE: Hemoglobin: 11.4 g/dL — ABNORMAL LOW (ref 13.0–17.0)

## 2015-03-20 MED ORDER — DARBEPOETIN ALFA 100 MCG/0.5ML IJ SOSY
100.0000 ug | PREFILLED_SYRINGE | INTRAMUSCULAR | Status: DC
  Administered 2015-03-20: 100 ug via SUBCUTANEOUS

## 2015-03-20 MED ORDER — CLONIDINE HCL 0.1 MG PO TABS: 0.1000 mg | ORAL_TABLET | Freq: Once | ORAL | Status: DC | PRN

## 2015-03-20 MED ORDER — DARBEPOETIN ALFA 100 MCG/0.5ML IJ SOSY
PREFILLED_SYRINGE | INTRAMUSCULAR | Status: AC
  Filled 2015-03-20: qty 0.5

## 2015-03-20 NOTE — Discharge Instructions (Signed)
Darbepoetin Alfa injection What is this medicine? DARBEPOETIN ALFA (dar be POE e tin AL fa) helps your body make more red blood cells. It is used to treat anemia caused by chronic kidney failure and chemotherapy. This medicine may be used for other purposes; ask your health care provider or pharmacist if you have questions. What should I tell my health care provider before I take this medicine? They need to know if you have any of these conditions: -blood clotting disorders or history of blood clots -cancer patient not on chemotherapy -cystic fibrosis -heart disease, such as angina, heart failure, or a history of a heart attack -hemoglobin level of 12 g/dL or greater -high blood pressure -low levels of folate, iron, or vitamin B12 -seizures -an unusual or allergic reaction to darbepoetin, erythropoietin, albumin, hamster proteins, latex, other medicines, foods, dyes, or preservatives -pregnant or trying to get pregnant -breast-feeding How should I use this medicine? This medicine is for injection into a vein or under the skin. It is usually given by a health care professional in a hospital or clinic setting. If you get this medicine at home, you will be taught how to prepare and give this medicine. Do not shake the solution before you withdraw a dose. Use exactly as directed. Take your medicine at regular intervals. Do not take your medicine more often than directed. It is important that you put your used needles and syringes in a special sharps container. Do not put them in a trash can. If you do not have a sharps container, call your pharmacist or healthcare provider to get one. Talk to your pediatrician regarding the use of this medicine in children. While this medicine may be used in children as young as 1 year for selected conditions, precautions do apply. Overdosage: If you think you have taken too much of this medicine contact a poison control center or emergency room at once. NOTE:  This medicine is only for you. Do not share this medicine with others. What if I miss a dose? If you miss a dose, take it as soon as you can. If it is almost time for your next dose, take only that dose. Do not take double or extra doses. What may interact with this medicine? Do not take this medicine with any of the following medications: -epoetin alfa This list may not describe all possible interactions. Give your health care provider a list of all the medicines, herbs, non-prescription drugs, or dietary supplements you use. Also tell them if you smoke, drink alcohol, or use illegal drugs. Some items may interact with your medicine. What should I watch for while using this medicine? Visit your prescriber or health care professional for regular checks on your progress and for the needed blood tests and blood pressure measurements. It is especially important for the doctor to make sure your hemoglobin level is in the desired range, to limit the risk of potential side effects and to give you the best benefit. Keep all appointments for any recommended tests. Check your blood pressure as directed. Ask your doctor what your blood pressure should be and when you should contact him or her. As your body makes more red blood cells, you may need to take iron, folic acid, or vitamin B supplements. Ask your doctor or health care provider which products are right for you. If you have kidney disease continue dietary restrictions, even though this medication can make you feel better. Talk with your doctor or health care professional about the   foods you eat and the vitamins that you take. What side effects may I notice from receiving this medicine? Side effects that you should report to your doctor or health care professional as soon as possible: -allergic reactions like skin rash, itching or hives, swelling of the face, lips, or tongue -breathing problems -changes in vision -chest pain -confusion, trouble speaking  or understanding -feeling faint or lightheaded, falls -high blood pressure -muscle aches or pains -pain, swelling, warmth in the leg -rapid weight gain -severe headaches -sudden numbness or weakness of the face, arm or leg -trouble walking, dizziness, loss of balance or coordination -seizures (convulsions) -swelling of the ankles, feet, hands -unusually weak or tired Side effects that usually do not require medical attention (report to your doctor or health care professional if they continue or are bothersome): -diarrhea -fever, chills (flu-like symptoms) -headaches -nausea, vomiting -redness, stinging, or swelling at site where injected This list may not describe all possible side effects. Call your doctor for medical advice about side effects. You may report side effects to FDA at 1-800-FDA-1088. Where should I keep my medicine? Keep out of the reach of children. Store in a refrigerator between 2 and 8 degrees C (36 and 46 degrees F). Do not freeze. Do not shake. Throw away any unused portion if using a single-dose vial. Throw away any unused medicine after the expiration date. NOTE: This sheet is a summary. It may not cover all possible information. If you have questions about this medicine, talk to your doctor, pharmacist, or health care provider.    2016, Elsevier/Gold Standard. (2008-02-09 10:23:57)  

## 2015-03-28 DIAGNOSIS — E1129 Type 2 diabetes mellitus with other diabetic kidney complication: Secondary | ICD-10-CM | POA: Diagnosis not present

## 2015-03-28 DIAGNOSIS — D631 Anemia in chronic kidney disease: Secondary | ICD-10-CM | POA: Diagnosis not present

## 2015-03-28 DIAGNOSIS — N184 Chronic kidney disease, stage 4 (severe): Secondary | ICD-10-CM | POA: Diagnosis not present

## 2015-03-28 DIAGNOSIS — I129 Hypertensive chronic kidney disease with stage 1 through stage 4 chronic kidney disease, or unspecified chronic kidney disease: Secondary | ICD-10-CM | POA: Diagnosis not present

## 2015-03-29 DIAGNOSIS — Z9189 Other specified personal risk factors, not elsewhere classified: Secondary | ICD-10-CM | POA: Diagnosis not present

## 2015-03-29 DIAGNOSIS — Z6831 Body mass index (BMI) 31.0-31.9, adult: Secondary | ICD-10-CM | POA: Diagnosis not present

## 2015-03-29 DIAGNOSIS — I480 Paroxysmal atrial fibrillation: Secondary | ICD-10-CM | POA: Diagnosis not present

## 2015-03-29 DIAGNOSIS — N184 Chronic kidney disease, stage 4 (severe): Secondary | ICD-10-CM | POA: Diagnosis not present

## 2015-03-29 DIAGNOSIS — S72009A Fracture of unspecified part of neck of unspecified femur, initial encounter for closed fracture: Secondary | ICD-10-CM | POA: Diagnosis not present

## 2015-03-29 DIAGNOSIS — I509 Heart failure, unspecified: Secondary | ICD-10-CM | POA: Diagnosis not present

## 2015-03-29 DIAGNOSIS — E1122 Type 2 diabetes mellitus with diabetic chronic kidney disease: Secondary | ICD-10-CM | POA: Diagnosis not present

## 2015-04-04 DIAGNOSIS — Z7901 Long term (current) use of anticoagulants: Secondary | ICD-10-CM | POA: Diagnosis not present

## 2015-04-04 DIAGNOSIS — E1122 Type 2 diabetes mellitus with diabetic chronic kidney disease: Secondary | ICD-10-CM | POA: Diagnosis not present

## 2015-04-04 DIAGNOSIS — I5032 Chronic diastolic (congestive) heart failure: Secondary | ICD-10-CM | POA: Diagnosis not present

## 2015-04-04 DIAGNOSIS — I5031 Acute diastolic (congestive) heart failure: Secondary | ICD-10-CM | POA: Diagnosis not present

## 2015-04-04 DIAGNOSIS — Z79899 Other long term (current) drug therapy: Secondary | ICD-10-CM | POA: Diagnosis not present

## 2015-04-04 DIAGNOSIS — I480 Paroxysmal atrial fibrillation: Secondary | ICD-10-CM | POA: Diagnosis not present

## 2015-04-04 DIAGNOSIS — Z951 Presence of aortocoronary bypass graft: Secondary | ICD-10-CM | POA: Diagnosis not present

## 2015-04-04 DIAGNOSIS — N184 Chronic kidney disease, stage 4 (severe): Secondary | ICD-10-CM | POA: Diagnosis not present

## 2015-04-04 DIAGNOSIS — E785 Hyperlipidemia, unspecified: Secondary | ICD-10-CM | POA: Diagnosis not present

## 2015-04-04 DIAGNOSIS — I119 Hypertensive heart disease without heart failure: Secondary | ICD-10-CM | POA: Diagnosis not present

## 2015-04-04 DIAGNOSIS — I2510 Atherosclerotic heart disease of native coronary artery without angina pectoris: Secondary | ICD-10-CM | POA: Diagnosis not present

## 2015-04-04 DIAGNOSIS — Z950 Presence of cardiac pacemaker: Secondary | ICD-10-CM | POA: Diagnosis not present

## 2015-04-05 ENCOUNTER — Encounter (HOSPITAL_COMMUNITY)
Admission: RE | Admit: 2015-04-05 | Discharge: 2015-04-05 | Disposition: A | Discharge status: Home / Self Care | Payer: Medicare Other | Source: Ambulatory Visit | Attending: Nephrology | Admitting: Nephrology

## 2015-04-05 DIAGNOSIS — D631 Anemia in chronic kidney disease: Secondary | ICD-10-CM | POA: Diagnosis not present

## 2015-04-05 DIAGNOSIS — N184 Chronic kidney disease, stage 4 (severe): Secondary | ICD-10-CM | POA: Diagnosis not present

## 2015-04-05 DIAGNOSIS — Z79899 Other long term (current) drug therapy: Secondary | ICD-10-CM | POA: Diagnosis not present

## 2015-04-05 DIAGNOSIS — Z5181 Encounter for therapeutic drug level monitoring: Secondary | ICD-10-CM | POA: Diagnosis not present

## 2015-04-05 LAB — POCT HEMOGLOBIN-HEMACUE: HEMOGLOBIN: 11.4 g/dL — AB (ref 13.0–17.0)

## 2015-04-05 MED ORDER — DARBEPOETIN ALFA 100 MCG/0.5ML IJ SOSY
PREFILLED_SYRINGE | INTRAMUSCULAR | Status: AC
  Administered 2015-04-05: 100 ug via SUBCUTANEOUS
  Filled 2015-04-05: qty 0.5

## 2015-04-05 MED ORDER — DARBEPOETIN ALFA 100 MCG/0.5ML IJ SOSY: 100.0000 ug | PREFILLED_SYRINGE | INTRAMUSCULAR | Status: DC

## 2015-04-19 ENCOUNTER — Encounter (HOSPITAL_COMMUNITY)
Admission: RE | Admit: 2015-04-19 | Discharge: 2015-04-19 | Disposition: A | Discharge status: Home / Self Care | Payer: Medicare Other | Source: Ambulatory Visit | Attending: Nephrology | Admitting: Nephrology

## 2015-04-19 DIAGNOSIS — Z5181 Encounter for therapeutic drug level monitoring: Secondary | ICD-10-CM | POA: Diagnosis not present

## 2015-04-19 DIAGNOSIS — D631 Anemia in chronic kidney disease: Secondary | ICD-10-CM | POA: Diagnosis not present

## 2015-04-19 DIAGNOSIS — Z79899 Other long term (current) drug therapy: Secondary | ICD-10-CM | POA: Insufficient documentation

## 2015-04-19 DIAGNOSIS — N184 Chronic kidney disease, stage 4 (severe): Secondary | ICD-10-CM | POA: Insufficient documentation

## 2015-04-19 LAB — POCT HEMOGLOBIN-HEMACUE: HEMOGLOBIN: 11.5 g/dL — AB (ref 13.0–17.0)

## 2015-04-19 LAB — IRON AND TIBC
Iron: 149 ug/dL (ref 45–182)
Saturation Ratios: 45 % — ABNORMAL HIGH (ref 17.9–39.5)
TIBC: 332 ug/dL (ref 250–450)
UIBC: 183 ug/dL

## 2015-04-19 LAB — FERRITIN: Ferritin: 589 ng/mL — ABNORMAL HIGH (ref 24–336)

## 2015-04-19 MED ORDER — DARBEPOETIN ALFA 100 MCG/0.5ML IJ SOSY
100.0000 ug | PREFILLED_SYRINGE | INTRAMUSCULAR | Status: DC
  Administered 2015-04-19: 100 ug via SUBCUTANEOUS

## 2015-04-19 MED ORDER — DARBEPOETIN ALFA 100 MCG/0.5ML IJ SOSY
PREFILLED_SYRINGE | INTRAMUSCULAR | Status: AC
  Administered 2015-04-19: 100 ug via SUBCUTANEOUS
  Filled 2015-04-19: qty 0.5

## 2015-04-28 DIAGNOSIS — Z6831 Body mass index (BMI) 31.0-31.9, adult: Secondary | ICD-10-CM | POA: Diagnosis not present

## 2015-04-28 DIAGNOSIS — N184 Chronic kidney disease, stage 4 (severe): Secondary | ICD-10-CM | POA: Diagnosis not present

## 2015-04-28 DIAGNOSIS — S72009A Fracture of unspecified part of neck of unspecified femur, initial encounter for closed fracture: Secondary | ICD-10-CM | POA: Diagnosis not present

## 2015-04-28 DIAGNOSIS — E1122 Type 2 diabetes mellitus with diabetic chronic kidney disease: Secondary | ICD-10-CM | POA: Diagnosis not present

## 2015-04-28 DIAGNOSIS — I5033 Acute on chronic diastolic (congestive) heart failure: Secondary | ICD-10-CM | POA: Diagnosis not present

## 2015-05-03 ENCOUNTER — Encounter (HOSPITAL_COMMUNITY)
Admission: RE | Admit: 2015-05-03 | Discharge: 2015-05-03 | Disposition: A | Discharge status: Home / Self Care | Payer: Medicare Other | Source: Ambulatory Visit | Attending: Nephrology | Admitting: Nephrology

## 2015-05-03 DIAGNOSIS — I480 Paroxysmal atrial fibrillation: Secondary | ICD-10-CM | POA: Diagnosis not present

## 2015-05-03 DIAGNOSIS — I5032 Chronic diastolic (congestive) heart failure: Secondary | ICD-10-CM | POA: Diagnosis not present

## 2015-05-03 DIAGNOSIS — Z79899 Other long term (current) drug therapy: Secondary | ICD-10-CM | POA: Diagnosis not present

## 2015-05-03 DIAGNOSIS — N184 Chronic kidney disease, stage 4 (severe): Secondary | ICD-10-CM | POA: Diagnosis not present

## 2015-05-03 DIAGNOSIS — Z951 Presence of aortocoronary bypass graft: Secondary | ICD-10-CM | POA: Diagnosis not present

## 2015-05-03 DIAGNOSIS — Z950 Presence of cardiac pacemaker: Secondary | ICD-10-CM | POA: Diagnosis not present

## 2015-05-03 DIAGNOSIS — I119 Hypertensive heart disease without heart failure: Secondary | ICD-10-CM | POA: Diagnosis not present

## 2015-05-03 DIAGNOSIS — E785 Hyperlipidemia, unspecified: Secondary | ICD-10-CM | POA: Diagnosis not present

## 2015-05-03 DIAGNOSIS — R0602 Shortness of breath: Secondary | ICD-10-CM | POA: Diagnosis not present

## 2015-05-03 DIAGNOSIS — E1122 Type 2 diabetes mellitus with diabetic chronic kidney disease: Secondary | ICD-10-CM | POA: Diagnosis not present

## 2015-05-03 DIAGNOSIS — Z7901 Long term (current) use of anticoagulants: Secondary | ICD-10-CM | POA: Diagnosis not present

## 2015-05-03 DIAGNOSIS — I2510 Atherosclerotic heart disease of native coronary artery without angina pectoris: Secondary | ICD-10-CM | POA: Diagnosis not present

## 2015-05-03 DIAGNOSIS — D631 Anemia in chronic kidney disease: Secondary | ICD-10-CM | POA: Diagnosis not present

## 2015-05-03 DIAGNOSIS — Z5181 Encounter for therapeutic drug level monitoring: Secondary | ICD-10-CM | POA: Diagnosis not present

## 2015-05-03 LAB — POCT HEMOGLOBIN-HEMACUE: Hemoglobin: 11.6 g/dL — ABNORMAL LOW (ref 13.0–17.0)

## 2015-05-03 MED ORDER — DARBEPOETIN ALFA 100 MCG/0.5ML IJ SOSY
100.0000 ug | PREFILLED_SYRINGE | INTRAMUSCULAR | Status: DC
  Administered 2015-05-03: 100 ug via SUBCUTANEOUS

## 2015-05-03 MED ORDER — DARBEPOETIN ALFA 100 MCG/0.5ML IJ SOSY
PREFILLED_SYRINGE | INTRAMUSCULAR | Status: AC
  Filled 2015-05-03: qty 0.5

## 2015-05-17 ENCOUNTER — Encounter (HOSPITAL_COMMUNITY)
Admission: RE | Admit: 2015-05-17 | Discharge: 2015-05-17 | Disposition: A | Discharge status: Home / Self Care | Payer: Medicare Other | Source: Ambulatory Visit | Attending: Nephrology | Admitting: Nephrology

## 2015-05-17 DIAGNOSIS — D631 Anemia in chronic kidney disease: Secondary | ICD-10-CM | POA: Insufficient documentation

## 2015-05-17 DIAGNOSIS — N184 Chronic kidney disease, stage 4 (severe): Secondary | ICD-10-CM | POA: Insufficient documentation

## 2015-05-17 DIAGNOSIS — Z5181 Encounter for therapeutic drug level monitoring: Secondary | ICD-10-CM | POA: Insufficient documentation

## 2015-05-17 DIAGNOSIS — Z79899 Other long term (current) drug therapy: Secondary | ICD-10-CM | POA: Diagnosis not present

## 2015-05-17 LAB — IRON AND TIBC
Iron: 221 ug/dL — ABNORMAL HIGH (ref 45–182)
SATURATION RATIOS: 71 % — AB (ref 17.9–39.5)
TIBC: 311 ug/dL (ref 250–450)
UIBC: 90 ug/dL

## 2015-05-17 LAB — POCT HEMOGLOBIN-HEMACUE: HEMOGLOBIN: 12.3 g/dL — AB (ref 13.0–17.0)

## 2015-05-17 LAB — FERRITIN: Ferritin: 650 ng/mL — ABNORMAL HIGH (ref 24–336)

## 2015-05-17 MED ORDER — DARBEPOETIN ALFA 100 MCG/0.5ML IJ SOSY: 100.0000 ug | PREFILLED_SYRINGE | INTRAMUSCULAR | Status: DC

## 2015-05-25 DIAGNOSIS — N184 Chronic kidney disease, stage 4 (severe): Secondary | ICD-10-CM | POA: Diagnosis not present

## 2015-05-25 DIAGNOSIS — I129 Hypertensive chronic kidney disease with stage 1 through stage 4 chronic kidney disease, or unspecified chronic kidney disease: Secondary | ICD-10-CM | POA: Diagnosis not present

## 2015-05-25 DIAGNOSIS — D631 Anemia in chronic kidney disease: Secondary | ICD-10-CM | POA: Diagnosis not present

## 2015-05-25 DIAGNOSIS — E1129 Type 2 diabetes mellitus with other diabetic kidney complication: Secondary | ICD-10-CM | POA: Diagnosis not present

## 2015-05-29 DIAGNOSIS — I129 Hypertensive chronic kidney disease with stage 1 through stage 4 chronic kidney disease, or unspecified chronic kidney disease: Secondary | ICD-10-CM | POA: Diagnosis not present

## 2015-05-31 ENCOUNTER — Encounter (HOSPITAL_COMMUNITY): Payer: Medicare Other

## 2015-05-31 DIAGNOSIS — Z7901 Long term (current) use of anticoagulants: Secondary | ICD-10-CM | POA: Diagnosis not present

## 2015-05-31 DIAGNOSIS — I5032 Chronic diastolic (congestive) heart failure: Secondary | ICD-10-CM | POA: Diagnosis not present

## 2015-05-31 DIAGNOSIS — E785 Hyperlipidemia, unspecified: Secondary | ICD-10-CM | POA: Diagnosis not present

## 2015-05-31 DIAGNOSIS — R0602 Shortness of breath: Secondary | ICD-10-CM | POA: Diagnosis not present

## 2015-05-31 DIAGNOSIS — I480 Paroxysmal atrial fibrillation: Secondary | ICD-10-CM | POA: Diagnosis not present

## 2015-05-31 DIAGNOSIS — I119 Hypertensive heart disease without heart failure: Secondary | ICD-10-CM | POA: Diagnosis not present

## 2015-05-31 DIAGNOSIS — Z950 Presence of cardiac pacemaker: Secondary | ICD-10-CM | POA: Diagnosis not present

## 2015-05-31 DIAGNOSIS — I2510 Atherosclerotic heart disease of native coronary artery without angina pectoris: Secondary | ICD-10-CM | POA: Diagnosis not present

## 2015-05-31 DIAGNOSIS — E1122 Type 2 diabetes mellitus with diabetic chronic kidney disease: Secondary | ICD-10-CM | POA: Diagnosis not present

## 2015-05-31 DIAGNOSIS — Z951 Presence of aortocoronary bypass graft: Secondary | ICD-10-CM | POA: Diagnosis not present

## 2015-05-31 DIAGNOSIS — N184 Chronic kidney disease, stage 4 (severe): Secondary | ICD-10-CM | POA: Diagnosis not present

## 2015-06-07 ENCOUNTER — Encounter (HOSPITAL_COMMUNITY)
Admission: RE | Admit: 2015-06-07 | Discharge: 2015-06-07 | Disposition: A | Discharge status: Home / Self Care | Payer: Medicare Other | Source: Ambulatory Visit | Attending: Nephrology | Admitting: Nephrology

## 2015-06-07 DIAGNOSIS — N184 Chronic kidney disease, stage 4 (severe): Secondary | ICD-10-CM | POA: Diagnosis not present

## 2015-06-07 DIAGNOSIS — D631 Anemia in chronic kidney disease: Secondary | ICD-10-CM | POA: Diagnosis not present

## 2015-06-07 DIAGNOSIS — Z5181 Encounter for therapeutic drug level monitoring: Secondary | ICD-10-CM | POA: Diagnosis not present

## 2015-06-07 DIAGNOSIS — Z79899 Other long term (current) drug therapy: Secondary | ICD-10-CM | POA: Diagnosis not present

## 2015-06-07 LAB — POCT HEMOGLOBIN-HEMACUE: Hemoglobin: 10.3 g/dL — ABNORMAL LOW (ref 13.0–17.0)

## 2015-06-07 MED ORDER — DARBEPOETIN ALFA 100 MCG/0.5ML IJ SOSY
100.0000 ug | PREFILLED_SYRINGE | INTRAMUSCULAR | Status: DC
Start: 2015-06-07 — End: 2015-06-08
  Administered 2015-06-07: 100 ug via SUBCUTANEOUS

## 2015-06-07 MED ORDER — DARBEPOETIN ALFA 100 MCG/0.5ML IJ SOSY
PREFILLED_SYRINGE | INTRAMUSCULAR | Status: AC
  Administered 2015-06-07: 100 ug via SUBCUTANEOUS
  Filled 2015-06-07: qty 0.5

## 2015-06-19 ENCOUNTER — Inpatient Hospital Stay: Admission: RE | Admit: 2015-06-19 | Payer: Medicare Other | Source: Ambulatory Visit

## 2015-06-19 ENCOUNTER — Ambulatory Visit (INDEPENDENT_AMBULATORY_CARE_PROVIDER_SITE_OTHER)
Admission: RE | Admit: 2015-06-19 | Discharge: 2015-06-19 | Disposition: A | Discharge status: Home / Self Care | Payer: Medicare Other | Source: Ambulatory Visit | Attending: Pulmonary Disease | Admitting: Pulmonary Disease

## 2015-06-19 DIAGNOSIS — J984 Other disorders of lung: Secondary | ICD-10-CM | POA: Diagnosis not present

## 2015-06-19 DIAGNOSIS — R911 Solitary pulmonary nodule: Secondary | ICD-10-CM

## 2015-06-20 DIAGNOSIS — E1151 Type 2 diabetes mellitus with diabetic peripheral angiopathy without gangrene: Secondary | ICD-10-CM | POA: Diagnosis not present

## 2015-06-20 DIAGNOSIS — S72009A Fracture of unspecified part of neck of unspecified femur, initial encounter for closed fracture: Secondary | ICD-10-CM | POA: Diagnosis not present

## 2015-06-20 DIAGNOSIS — Z6831 Body mass index (BMI) 31.0-31.9, adult: Secondary | ICD-10-CM | POA: Diagnosis not present

## 2015-06-20 DIAGNOSIS — Z1389 Encounter for screening for other disorder: Secondary | ICD-10-CM | POA: Diagnosis not present

## 2015-06-20 DIAGNOSIS — N184 Chronic kidney disease, stage 4 (severe): Secondary | ICD-10-CM | POA: Diagnosis not present

## 2015-06-20 DIAGNOSIS — E1122 Type 2 diabetes mellitus with diabetic chronic kidney disease: Secondary | ICD-10-CM | POA: Diagnosis not present

## 2015-06-20 DIAGNOSIS — D509 Iron deficiency anemia, unspecified: Secondary | ICD-10-CM | POA: Diagnosis not present

## 2015-06-27 ENCOUNTER — Encounter (HOSPITAL_COMMUNITY)
Admission: RE | Admit: 2015-06-27 | Discharge: 2015-06-27 | Disposition: A | Discharge status: Home / Self Care | Payer: Medicare Other | Source: Ambulatory Visit | Attending: Nephrology | Admitting: Nephrology

## 2015-06-27 DIAGNOSIS — Z79899 Other long term (current) drug therapy: Secondary | ICD-10-CM | POA: Insufficient documentation

## 2015-06-27 DIAGNOSIS — Z5181 Encounter for therapeutic drug level monitoring: Secondary | ICD-10-CM | POA: Diagnosis not present

## 2015-06-27 DIAGNOSIS — N184 Chronic kidney disease, stage 4 (severe): Secondary | ICD-10-CM | POA: Diagnosis not present

## 2015-06-27 DIAGNOSIS — D631 Anemia in chronic kidney disease: Secondary | ICD-10-CM | POA: Insufficient documentation

## 2015-06-27 LAB — POCT HEMOGLOBIN-HEMACUE: Hemoglobin: 10.5 g/dL — ABNORMAL LOW (ref 13.0–17.0)

## 2015-06-27 LAB — IRON AND TIBC
Iron: 116 ug/dL (ref 45–182)
SATURATION RATIOS: 39 % (ref 17.9–39.5)
TIBC: 297 ug/dL (ref 250–450)
UIBC: 181 ug/dL

## 2015-06-27 LAB — FERRITIN: Ferritin: 562 ng/mL — ABNORMAL HIGH (ref 24–336)

## 2015-06-27 MED ORDER — DARBEPOETIN ALFA 100 MCG/0.5ML IJ SOSY
PREFILLED_SYRINGE | INTRAMUSCULAR | Status: AC
  Filled 2015-06-27: qty 0.5

## 2015-06-27 MED ORDER — DARBEPOETIN ALFA 100 MCG/0.5ML IJ SOSY
100.0000 ug | PREFILLED_SYRINGE | INTRAMUSCULAR | Status: DC
Start: 2015-06-27 — End: 2015-06-28
  Administered 2015-06-27: 100 ug via SUBCUTANEOUS

## 2015-06-28 ENCOUNTER — Ambulatory Visit (INDEPENDENT_AMBULATORY_CARE_PROVIDER_SITE_OTHER): Payer: Medicare Other | Admitting: Pulmonary Disease

## 2015-06-28 ENCOUNTER — Encounter: Payer: Self-pay | Admitting: Pulmonary Disease

## 2015-06-28 ENCOUNTER — Encounter (HOSPITAL_COMMUNITY): Payer: Medicare Other

## 2015-06-28 VITALS — BP 110/64 | HR 73 | Ht 73.0 in | Wt 233.8 lb

## 2015-06-28 DIAGNOSIS — J984 Other disorders of lung: Secondary | ICD-10-CM

## 2015-06-28 DIAGNOSIS — R911 Solitary pulmonary nodule: Secondary | ICD-10-CM

## 2015-06-28 DIAGNOSIS — I5032 Chronic diastolic (congestive) heart failure: Secondary | ICD-10-CM

## 2015-06-28 NOTE — Patient Instructions (Signed)
You have mild degree of scarring in your lungs Call us as needed

## 2015-06-28 NOTE — Progress Notes (Signed)
   Subjective:    Patient ID: Christopher [name], male    DOB: 06-11-1926, 82 y.o.   MRN: [id number]  HPI  82 year old remote smoker for FU of dyspnea and upper airway wheezing. He had Episode of acute dizziness with fall resulting in trauma to the right side of the head- head CT neg. he unfortunately fractured his hip and required surgery. He has chronic atrial fibrillation, has failed cardioversion, has a pacemaker and is maintained on amiodarone for many years and Coumadin Wynonia Lawman). He also has CKD Physicist, medical) He is a retired [employer], smoked for less than 20 pack years before he quit in 1970. He underwent CABG in 2003 and back surgery in 2001.  06/28/2015  Chief Complaint  Patient presents with  . Follow-up    breathing doing well. CT results.    41m FU He had a hospital admission for fluid overload-Lasix dose was increased and this dramatically improved his dyspnea and wheezing In the past ,Albuterol did not relieve, flonase provided dramatic relief Reviewed CT imaging - Mild bibasal pulmonary scarring Has been on amio x many years, lower dose now Ambulates with walker  02/2015 BUN/Cr 88/3.4  Significant tests/ events   Chest x-ray 10/31/14 shows pacemaker, no infiltrates or effusions Echo [id number] LVH, diastolic dysfunction, PASP at least 49 mm CT neck 11/2014 neg CT chest 11/2014 >> mild emphysema ,chronic appearing mild bronchiectasis at bases CT chest 06/2015 >> mild scarring  PFTs 12/2014 >>mod-severe restriction , DLCO 33%  Review of Systems Patient denies significant dyspnea,cough, hemoptysis,  chest pain, palpitations, pedal edema, orthopnea, paroxysmal nocturnal dyspnea, lightheadedness, nausea, vomiting, abdominal or  leg pains      Objective:   Physical Exam  Gen. Pleasant, obese, in no distress ENT - no lesions, no post nasal drip Neck: No JVD, no thyromegaly, no carotid bruits Lungs: no use of accessory muscles, no dullness to percussion, decreased  without rales or rhonchi  Cardiovascular: Rhythm regular, heart sounds  normal, no murmurs or gallops, no peripheral edema Musculoskeletal: No deformities, no cyanosis or clubbing , no tremors       Assessment & Plan:

## 2015-06-28 NOTE — Assessment & Plan Note (Signed)
Resolved on follow-up imaging He has mild degree of pulmonary scarring that does not appear to be progressive. No follow-up required

## 2015-06-28 NOTE — Assessment & Plan Note (Signed)
This seems to be the main cause of his dyspnea He is on higher Lasix dosing which keeps the fluid and check Renal function is being monitored by nephrology

## 2015-06-29 DIAGNOSIS — I2510 Atherosclerotic heart disease of native coronary artery without angina pectoris: Secondary | ICD-10-CM | POA: Diagnosis not present

## 2015-06-29 DIAGNOSIS — Z950 Presence of cardiac pacemaker: Secondary | ICD-10-CM | POA: Diagnosis not present

## 2015-06-29 DIAGNOSIS — I5032 Chronic diastolic (congestive) heart failure: Secondary | ICD-10-CM | POA: Diagnosis not present

## 2015-06-29 DIAGNOSIS — Z01810 Encounter for preprocedural cardiovascular examination: Secondary | ICD-10-CM | POA: Diagnosis not present

## 2015-06-29 DIAGNOSIS — N184 Chronic kidney disease, stage 4 (severe): Secondary | ICD-10-CM | POA: Diagnosis not present

## 2015-06-29 DIAGNOSIS — I119 Hypertensive heart disease without heart failure: Secondary | ICD-10-CM | POA: Diagnosis not present

## 2015-06-29 DIAGNOSIS — E785 Hyperlipidemia, unspecified: Secondary | ICD-10-CM | POA: Diagnosis not present

## 2015-06-29 DIAGNOSIS — Z951 Presence of aortocoronary bypass graft: Secondary | ICD-10-CM | POA: Diagnosis not present

## 2015-06-29 DIAGNOSIS — Z7901 Long term (current) use of anticoagulants: Secondary | ICD-10-CM | POA: Diagnosis not present

## 2015-06-29 DIAGNOSIS — R0602 Shortness of breath: Secondary | ICD-10-CM | POA: Diagnosis not present

## 2015-06-29 DIAGNOSIS — I480 Paroxysmal atrial fibrillation: Secondary | ICD-10-CM | POA: Diagnosis not present

## 2015-06-29 DIAGNOSIS — E1122 Type 2 diabetes mellitus with diabetic chronic kidney disease: Secondary | ICD-10-CM | POA: Diagnosis not present

## 2015-07-12 DIAGNOSIS — C61 Malignant neoplasm of prostate: Secondary | ICD-10-CM | POA: Diagnosis not present

## 2015-07-12 DIAGNOSIS — Z950 Presence of cardiac pacemaker: Secondary | ICD-10-CM | POA: Diagnosis not present

## 2015-07-12 DIAGNOSIS — I5032 Chronic diastolic (congestive) heart failure: Secondary | ICD-10-CM | POA: Diagnosis not present

## 2015-07-12 DIAGNOSIS — R0602 Shortness of breath: Secondary | ICD-10-CM | POA: Diagnosis not present

## 2015-07-12 DIAGNOSIS — Z7901 Long term (current) use of anticoagulants: Secondary | ICD-10-CM | POA: Diagnosis not present

## 2015-07-12 DIAGNOSIS — E785 Hyperlipidemia, unspecified: Secondary | ICD-10-CM | POA: Diagnosis not present

## 2015-07-12 DIAGNOSIS — I2510 Atherosclerotic heart disease of native coronary artery without angina pectoris: Secondary | ICD-10-CM | POA: Diagnosis not present

## 2015-07-12 DIAGNOSIS — I480 Paroxysmal atrial fibrillation: Secondary | ICD-10-CM | POA: Diagnosis not present

## 2015-07-12 DIAGNOSIS — I119 Hypertensive heart disease without heart failure: Secondary | ICD-10-CM | POA: Diagnosis not present

## 2015-07-12 DIAGNOSIS — Z951 Presence of aortocoronary bypass graft: Secondary | ICD-10-CM | POA: Diagnosis not present

## 2015-07-12 DIAGNOSIS — N184 Chronic kidney disease, stage 4 (severe): Secondary | ICD-10-CM | POA: Diagnosis not present

## 2015-07-12 DIAGNOSIS — E1122 Type 2 diabetes mellitus with diabetic chronic kidney disease: Secondary | ICD-10-CM | POA: Diagnosis not present

## 2015-07-13 ENCOUNTER — Ambulatory Visit: Payer: Self-pay | Admitting: Orthopedic Surgery

## 2015-07-13 NOTE — Progress Notes (Signed)
Preoperative surgical orders have been place into the Epic hospital system for Christopher [name] on 07/13/2015, 10:36 AM  by Mickel Crow for surgery on 07-26-15.  Preop Total Hip orders including IV Tylenol, and IV Decadron as long as there are no contraindications to the above medications. Arlee Muslim, PA-C

## 2015-07-18 ENCOUNTER — Ambulatory Visit (HOSPITAL_COMMUNITY)
Admission: RE | Admit: 2015-07-18 | Discharge: 2015-07-18 | Disposition: A | Discharge status: Home / Self Care | Payer: Medicare Other | Source: Ambulatory Visit | Attending: Nephrology | Admitting: Nephrology

## 2015-07-18 ENCOUNTER — Other Ambulatory Visit (HOSPITAL_COMMUNITY): Payer: Self-pay | Admitting: *Deleted

## 2015-07-18 DIAGNOSIS — Z5181 Encounter for therapeutic drug level monitoring: Secondary | ICD-10-CM | POA: Insufficient documentation

## 2015-07-18 DIAGNOSIS — D631 Anemia in chronic kidney disease: Secondary | ICD-10-CM | POA: Diagnosis not present

## 2015-07-18 DIAGNOSIS — N184 Chronic kidney disease, stage 4 (severe): Secondary | ICD-10-CM | POA: Diagnosis not present

## 2015-07-18 DIAGNOSIS — Z79899 Other long term (current) drug therapy: Secondary | ICD-10-CM | POA: Insufficient documentation

## 2015-07-18 LAB — POCT HEMOGLOBIN-HEMACUE: Hemoglobin: 10.3 g/dL — ABNORMAL LOW (ref 13.0–17.0)

## 2015-07-18 MED ORDER — DARBEPOETIN ALFA 100 MCG/0.5ML IJ SOSY
100.0000 ug | PREFILLED_SYRINGE | INTRAMUSCULAR | Status: DC
  Administered 2015-07-18: 100 ug via SUBCUTANEOUS

## 2015-07-18 MED ORDER — DARBEPOETIN ALFA 100 MCG/0.5ML IJ SOSY
PREFILLED_SYRINGE | INTRAMUSCULAR | Status: AC
  Administered 2015-07-18: 100 ug via SUBCUTANEOUS
  Filled 2015-07-18: qty 0.5

## 2015-07-18 NOTE — Progress Notes (Signed)
Cardiac clearnce/lov dr Wynonia Lawman 06-29-15 on chart Medical clearance note dr Dagmar Hait on chart [facility] nephrology dr Mercy Moore  05-25-15 on chart ekg 02-27-15 epic  pulmonary function test 12-22-14 epic Chest ct 06-19-15 epic Echo 01-16-15 epic Pacer orders dr Wynonia Lawman on chart

## 2015-07-18 NOTE — Patient Instructions (Signed)
Christopher [name]  07/18/2015   Your procedure is scheduled on: 07-26-15 Report to [facility] Main  Entrance take [facility]  elevators to 3rd floor to  [place] at 200 PM  Call this number if you have problems the morning of surgery [phone]   Remember: ONLY 1 PERSON MAY GO WITH YOU TO SHORT STAY TO GET  READY MORNING OF YOUR SURGERY.  Do not eat food or drink liquids :After Midnight.     Take these medicines the morning of surgery with A SIP OF WATER: amiodarone (pacerone), atenolol (tenormin), tamsulosin (flomax) DO NOT TAKE ANY DIABETIC MEDICATIONS DAY OF YOUR SURGERY                               You may not have any metal on your body including hair pins and              piercings  Do not wear jewelry, make-up, lotions, powders or perfumes, deodorant             Do not wear nail polish.  Do not shave  48 hours prior to surgery.              Men may shave face and neck.   Do not bring valuables to the hospital. [place].  Contacts, dentures or bridgework may not be worn into surgery.  Leave suitcase in the car. After surgery it may be brought to your room.     Patients discharged the day of surgery will not be allowed to drive home.  Name and phone number of your driver:  Special Instructions: N/A              Please read over the following fact sheets you were given: _____________________________________________________________________                CLEAR LIQUID DIET   Foods Allowed                                                                     Foods Excluded  Coffee and tea, regular and decaf                             liquids that you cannot  Plain Jell-O in any flavor                                             see through such as: Fruit ices (not with fruit pulp)                                     milk, soups, orange juice  Iced Popsicles  All  solid food Carbonated beverages, regular and diet                                    Cranberry, grape and apple juices Sports drinks like Gatorade Lightly seasoned clear broth or consume(fat free) Sugar, honey syrup  Sample Menu Breakfast                                Lunch                                     Supper Cranberry juice                    Beef broth                            Chicken broth Jell-O                                     Grape juice                           Apple juice Coffee or tea                        Jell-O                                      Popsicle                                                Coffee or tea                        Coffee or tea  _____________________________________________________________________  [facility] - Preparing for Surgery Before surgery, you can play an important role.  Because skin is not sterile, your skin needs to be as free of germs as possible.  You can reduce the number of germs on your skin by washing with CHG (chlorahexidine gluconate) soap before surgery.  CHG is an antiseptic cleaner which kills germs and bonds with the skin to continue killing germs even after washing. Please DO NOT use if you have an allergy to CHG or antibacterial soaps.  If your skin becomes reddened/irritated stop using the CHG and inform your nurse when you arrive at Short Stay. Do not shave (including legs and underarms) for at least 48 hours prior to the first CHG shower.  You may shave your face/neck. Please follow these instructions carefully:  1.  Shower with CHG Soap the night before surgery and the  morning of Surgery.  2.  If you choose to wash your hair, wash your hair first as usual with your  normal  shampoo.  3.  After you shampoo, rinse your hair and body thoroughly to remove the  shampoo.  4.  Use CHG as you would any other liquid soap.  You can apply chg directly  to the skin and wash                        Gently with a scrungie or clean washcloth.  5.  Apply the CHG Soap to your body ONLY FROM THE NECK DOWN.   Do not use on face/ open                           Wound or open sores. Avoid contact with eyes, ears mouth and genitals (private parts).                       Wash face,  Genitals (private parts) with your normal soap.             6.  Wash thoroughly, paying special attention to the area where your surgery  will be performed.  7.  Thoroughly rinse your body with warm water from the neck down.  8.  DO NOT shower/wash with your normal soap after using and rinsing off  the CHG Soap.                9.  Pat yourself dry with a clean towel.            10.  Wear clean pajamas.            11.  Place clean sheets on your bed the night of your first shower and do not  sleep with pets. Day of Surgery : Do not apply any lotions/deodorants the morning of surgery.  Please wear clean clothes to the hospital/surgery center.  FAILURE TO FOLLOW THESE INSTRUCTIONS MAY RESULT IN THE CANCELLATION OF YOUR SURGERY PATIENT SIGNATURE_________________________________  NURSE SIGNATURE__________________________________  ________________________________________________________________________   Adam Phenix  An incentive spirometer is a tool that can help keep your lungs clear and active. This tool measures how well you are filling your lungs with each breath. Taking long deep breaths may help reverse or decrease the chance of developing breathing (pulmonary) problems (especially infection) following:  A long period of time when you are unable to move or be active. BEFORE THE PROCEDURE   If the spirometer includes an indicator to show your best effort, your nurse or respiratory therapist will set it to a desired goal.  If possible, sit up straight or lean slightly forward. Try not to slouch.  Hold the incentive spirometer in an upright position. INSTRUCTIONS FOR USE   Sit on the edge of your bed  if possible, or sit up as far as you can in bed or on a chair.  Hold the incentive spirometer in an upright position.  Breathe out normally.  Place the mouthpiece in your mouth and seal your lips tightly around it.  Breathe in slowly and as deeply as possible, raising the piston or the ball toward the top of the column.  Hold your breath for 3-5 seconds or for as long as possible. Allow the piston or ball to fall to the bottom of the column.  Remove the mouthpiece from your mouth and breathe out normally.  Rest for a few seconds and repeat Steps 1 through 7 at least 10 times every 1-2 hours when you are awake. Take your time and take a few normal breaths between deep breaths.  The spirometer may include an indicator to  show your best effort. Use the indicator as a goal to work toward during each repetition.  After each set of 10 deep breaths, practice coughing to be sure your lungs are clear. If you have an incision (the cut made at the time of surgery), support your incision when coughing by placing a pillow or rolled up towels firmly against it. Once you are able to get out of bed, walk around indoors and cough well. You may stop using the incentive spirometer when instructed by your caregiver.  RISKS AND COMPLICATIONS  Take your time so you do not get dizzy or light-headed.  If you are in pain, you may need to take or ask for pain medication before doing incentive spirometry. It is harder to take a deep breath if you are having pain. AFTER USE  Rest and breathe slowly and easily.  It can be helpful to keep track of a log of your progress. Your caregiver can provide you with a simple table to help with this. If you are using the spirometer at home, follow these instructions: [place] IF:   You are having difficultly using the spirometer.  You have trouble using the spirometer as often as instructed.  Your pain medication is not giving enough relief while using the  spirometer.  You develop fever of 100.5 F (38.1 C) or higher. SEEK IMMEDIATE MEDICAL CARE IF:   You cough up bloody sputum that had not been present before.  You develop fever of 102 F (38.9 C) or greater.  You develop worsening pain at or near the incision site. MAKE SURE YOU:   Understand these instructions.  Will watch your condition.  Will get help right away if you are not doing well or get worse. Document Released: 07/08/2006 Document Revised: 05/20/2011 Document Reviewed: 09/08/2006 [employer] Patient Information 2014 [employer], [place].   ________________________________________________________________________  WHAT IS A BLOOD TRANSFUSION? Blood Transfusion Information  A transfusion is the replacement of blood or some of its parts. Blood is made up of multiple cells which provide different functions.  Red blood cells carry oxygen and are used for blood loss replacement.  White blood cells fight against infection.  Platelets control bleeding.  Plasma helps clot blood.  Other blood products are available for specialized needs, such as hemophilia or other clotting disorders. BEFORE THE TRANSFUSION  Who gives blood for transfusions?   Healthy volunteers who are fully evaluated to make sure their blood is safe. This is blood bank blood. Transfusion therapy is the safest it has ever been in the practice of medicine. Before blood is taken from a donor, a complete history is taken to make sure that person has no history of diseases nor engages in risky social behavior (examples are intravenous drug use or sexual activity with multiple partners). The donor's travel history is screened to minimize risk of transmitting infections, such as malaria. The donated blood is tested for signs of infectious diseases, such as HIV and hepatitis. The blood is then tested to be sure it is compatible with you in order to minimize the chance of a transfusion reaction. If you or a relative  donates blood, this is often done in anticipation of surgery and is not appropriate for emergency situations. It takes many days to process the donated blood. RISKS AND COMPLICATIONS Although transfusion therapy is very safe and saves many lives, the main dangers of transfusion include:   Getting an infectious disease.  Developing a transfusion reaction. This is an allergic reaction  to something in the blood you were given. Every precaution is taken to prevent this. The decision to have a blood transfusion has been considered carefully by your caregiver before blood is given. Blood is not given unless the benefits outweigh the risks. AFTER THE TRANSFUSION  Right after receiving a blood transfusion, you will usually feel much better and more energetic. This is especially true if your red blood cells have gotten low (anemic). The transfusion raises the level of the red blood cells which carry oxygen, and this usually causes an energy increase.  The nurse administering the transfusion will monitor you carefully for complications. HOME CARE INSTRUCTIONS  No special instructions are needed after a transfusion. You may find your energy is better. Speak with your caregiver about any limitations on activity for underlying diseases you may have. SEEK MEDICAL CARE IF:   Your condition is not improving after your transfusion.  You develop redness or irritation at the intravenous (IV) site. SEEK IMMEDIATE MEDICAL CARE IF:  Any of the following symptoms occur over the next 12 hours:  Shaking chills.  You have a temperature by mouth above 102 F (38.9 C), not controlled by medicine.  Chest, back, or muscle pain.  People around you feel you are not acting correctly or are confused.  Shortness of breath or difficulty breathing.  Dizziness and fainting.  You get a rash or develop hives.  You have a decrease in urine output.  Your urine turns a dark color or changes to pink, red, or brown. Any  of the following symptoms occur over the next 10 days:  You have a temperature by mouth above 102 F (38.9 C), not controlled by medicine.  Shortness of breath.  Weakness after normal activity.  The white part of the eye turns yellow (jaundice).  You have a decrease in the amount of urine or are urinating less often.  Your urine turns a dark color or changes to pink, red, or brown. Document Released: 02/23/2000 Document Revised: 05/20/2011 Document Reviewed: 10/12/2007 [facility] Patient Information 2014 [place], [place].  _______________________________________________________________________

## 2015-07-18 NOTE — Progress Notes (Signed)
DEVICE CHECK NOTE 02-09-14, (LAST CHECK OF DEVICE 07-12-15 PER DR Wynonia Lawman ORDERS)

## 2015-07-19 ENCOUNTER — Observation Stay (HOSPITAL_COMMUNITY)
Admission: EM | Admit: 2015-07-19 | Discharge: 2015-07-21 | Disposition: A | Discharge status: Home / Self Care | Payer: Medicare Other | Attending: Internal Medicine | Admitting: Internal Medicine

## 2015-07-19 ENCOUNTER — Encounter (HOSPITAL_COMMUNITY): Payer: Self-pay | Admitting: Nurse Practitioner

## 2015-07-19 ENCOUNTER — Emergency Department (HOSPITAL_COMMUNITY): Payer: Medicare Other

## 2015-07-19 DIAGNOSIS — I480 Paroxysmal atrial fibrillation: Secondary | ICD-10-CM | POA: Diagnosis present

## 2015-07-19 DIAGNOSIS — I2510 Atherosclerotic heart disease of native coronary artery without angina pectoris: Secondary | ICD-10-CM | POA: Diagnosis present

## 2015-07-19 DIAGNOSIS — N400 Enlarged prostate without lower urinary tract symptoms: Secondary | ICD-10-CM | POA: Insufficient documentation

## 2015-07-19 DIAGNOSIS — E1169 Type 2 diabetes mellitus with other specified complication: Secondary | ICD-10-CM | POA: Diagnosis not present

## 2015-07-19 DIAGNOSIS — R0902 Hypoxemia: Secondary | ICD-10-CM | POA: Diagnosis present

## 2015-07-19 DIAGNOSIS — I5033 Acute on chronic diastolic (congestive) heart failure: Secondary | ICD-10-CM | POA: Diagnosis present

## 2015-07-19 DIAGNOSIS — Z85828 Personal history of other malignant neoplasm of skin: Secondary | ICD-10-CM | POA: Diagnosis not present

## 2015-07-19 DIAGNOSIS — S0083XA Contusion of other part of head, initial encounter: Secondary | ICD-10-CM | POA: Insufficient documentation

## 2015-07-19 DIAGNOSIS — R51 Headache: Secondary | ICD-10-CM | POA: Diagnosis not present

## 2015-07-19 DIAGNOSIS — Z9049 Acquired absence of other specified parts of digestive tract: Secondary | ICD-10-CM | POA: Diagnosis not present

## 2015-07-19 DIAGNOSIS — I5032 Chronic diastolic (congestive) heart failure: Secondary | ICD-10-CM | POA: Insufficient documentation

## 2015-07-19 DIAGNOSIS — E1122 Type 2 diabetes mellitus with diabetic chronic kidney disease: Secondary | ICD-10-CM | POA: Insufficient documentation

## 2015-07-19 DIAGNOSIS — Y92008 Other place in unspecified non-institutional (private) residence as the place of occurrence of the external cause: Secondary | ICD-10-CM | POA: Diagnosis not present

## 2015-07-19 DIAGNOSIS — W01198A Fall on same level from slipping, tripping and stumbling with subsequent striking against other object, initial encounter: Secondary | ICD-10-CM | POA: Diagnosis not present

## 2015-07-19 DIAGNOSIS — Z87891 Personal history of nicotine dependence: Secondary | ICD-10-CM | POA: Insufficient documentation

## 2015-07-19 DIAGNOSIS — J189 Pneumonia, unspecified organism: Secondary | ICD-10-CM

## 2015-07-19 DIAGNOSIS — M8580 Other specified disorders of bone density and structure, unspecified site: Secondary | ICD-10-CM | POA: Diagnosis not present

## 2015-07-19 DIAGNOSIS — N185 Chronic kidney disease, stage 5: Secondary | ICD-10-CM | POA: Diagnosis present

## 2015-07-19 DIAGNOSIS — E1129 Type 2 diabetes mellitus with other diabetic kidney complication: Secondary | ICD-10-CM | POA: Diagnosis present

## 2015-07-19 DIAGNOSIS — R3916 Straining to void: Secondary | ICD-10-CM | POA: Diagnosis not present

## 2015-07-19 DIAGNOSIS — Z950 Presence of cardiac pacemaker: Secondary | ICD-10-CM | POA: Diagnosis not present

## 2015-07-19 DIAGNOSIS — Z7901 Long term (current) use of anticoagulants: Secondary | ICD-10-CM | POA: Insufficient documentation

## 2015-07-19 DIAGNOSIS — Z0000 Encounter for general adult medical examination without abnormal findings: Secondary | ICD-10-CM | POA: Diagnosis not present

## 2015-07-19 DIAGNOSIS — N401 Enlarged prostate with lower urinary tract symptoms: Secondary | ICD-10-CM | POA: Diagnosis not present

## 2015-07-19 DIAGNOSIS — Z951 Presence of aortocoronary bypass graft: Secondary | ICD-10-CM | POA: Insufficient documentation

## 2015-07-19 DIAGNOSIS — J9601 Acute respiratory failure with hypoxia: Secondary | ICD-10-CM | POA: Diagnosis not present

## 2015-07-19 DIAGNOSIS — Z8546 Personal history of malignant neoplasm of prostate: Secondary | ICD-10-CM | POA: Diagnosis not present

## 2015-07-19 DIAGNOSIS — I482 Chronic atrial fibrillation: Secondary | ICD-10-CM | POA: Diagnosis not present

## 2015-07-19 DIAGNOSIS — N184 Chronic kidney disease, stage 4 (severe): Secondary | ICD-10-CM | POA: Insufficient documentation

## 2015-07-19 DIAGNOSIS — E784 Other hyperlipidemia: Secondary | ICD-10-CM | POA: Diagnosis not present

## 2015-07-19 DIAGNOSIS — R778 Other specified abnormalities of plasma proteins: Secondary | ICD-10-CM | POA: Insufficient documentation

## 2015-07-19 DIAGNOSIS — I130 Hypertensive heart and chronic kidney disease with heart failure and stage 1 through stage 4 chronic kidney disease, or unspecified chronic kidney disease: Secondary | ICD-10-CM | POA: Insufficient documentation

## 2015-07-19 DIAGNOSIS — Z79899 Other long term (current) drug therapy: Secondary | ICD-10-CM | POA: Insufficient documentation

## 2015-07-19 DIAGNOSIS — S199XXA Unspecified injury of neck, initial encounter: Secondary | ICD-10-CM | POA: Diagnosis not present

## 2015-07-19 DIAGNOSIS — S0990XA Unspecified injury of head, initial encounter: Secondary | ICD-10-CM | POA: Diagnosis not present

## 2015-07-19 DIAGNOSIS — C61 Malignant neoplasm of prostate: Secondary | ICD-10-CM | POA: Diagnosis not present

## 2015-07-19 DIAGNOSIS — R351 Nocturia: Secondary | ICD-10-CM | POA: Diagnosis not present

## 2015-07-19 LAB — PROTIME-INR
INR: 1.95 — AB (ref 0.00–1.49)
PROTHROMBIN TIME: 22.1 s — AB (ref 11.6–15.2)

## 2015-07-19 NOTE — ED Notes (Addendum)
He was using his walker on gravel and it got stuck and he flipped over it landing in the gravel. He denies any LOC. He has abrasions and skin tears to L side of face and nose, L arm, L and R knees. He has swelling and bruising to L forehead and L cheek. Dried blood in L nare. He stopped his warfarin yesterday in preparation for hip surgery next week. He is alert and breathing easily

## 2015-07-19 NOTE — ED Provider Notes (Signed)
CSN: [id number]     Arrival date & time 07/19/15  1811 History   First MD Initiated Contact with Patient 07/19/15 2229     Chief Complaint  Patient presents with  . Fall     (Consider location/radiation/quality/duration/timing/severity/associated sxs/prior Treatment) Patient is a 82 y.o. male presenting with general illness. The history is provided by the patient.  Illness Location:  Fall Severity:  Moderate Onset quality:  Sudden Duration: 6hrs ago. Timing:  Constant Progression:  Unchanged Chronicity:  New Context:  Patient normally on warfarin, stopped taking it yesterday in preparation for surgery next week. Mechanical fall today. No prodromal symptoms. Did hit his head. No loss of consciousness. Associated symptoms: no abdominal pain, no chest pain, no cough, no diarrhea, no fever, no headaches, no nausea, no shortness of breath and no vomiting     Past Medical History  Diagnosis Date  . Lumbar disc disease   . BPH (benign prostatic hyperplasia)   . HTN (hypertension)   . Sinus node dysfunction (HCC)     symptomatic bradycardia  . Pacemaker     Medtronic-dual-chamber-DOI 2012  . CAD (coronary artery disease)     followed by Dr. Wynonia Lawman  . History of skin cancer   . Chronic kidney disease     kidneys function at "20 %" followed by Dr. Mercy Moore  . Prostate cancer ([place])   . Lumbar disc disease 1992  . Atrial fibrillation ([place]) 08/07/2011    CHADS score  2 CHADS2 VASC score 4  Onset winter of 2013 Cardioversion May 2013 Reversion to a fib 3/14 amiodarone begun 07/02/12  Repeat cardioversion Jul 16, 2012    . CAD (coronary artery disease) 03/06/2010    CABG with LIMA to LAD, SVG to OM-OM2  05/05/01 Dr. Roxan Hockey  Cardiac Cath 12/2009 60% left main, occluded LAD, Widely patent SVG to OM1 and 2, patent LIMA, widely patent RCA    . Cardiac pacemaker in situ     07/26/10  Inserted for chronotropic incompetence and dyspnea  RA lead  Medtronic 5076  serial number [id number]   RV  lead  Medtronic 5076  serial number [id number] Medtronic Adapta RL pulse generator, SN  [id number]    . Hypertensive heart disease without CHF    Past Surgical History  Procedure Laterality Date  . Coronary artery bypass graft  2003    x3  . Cardiac catheterization    . Lumbar laminectomy  11/01  . Cardioversion  08/08/2011    Procedure: CARDIOVERSION;  Surgeon: Jacolyn Reedy, MD;  Location: [facility];  Service: Cardiovascular;  Laterality: N/A;  . Cardioversion N/A 07/16/2012    Procedure: CARDIOVERSION;  Surgeon: Jacolyn Reedy, MD;  Location: [facility];  Service: Cardiovascular;  Laterality: N/A;  . Pacemaker insertion  2012  . Cataracts removed    . Cystoscopy with biopsy N/A 06/21/2013    Procedure: CYSTOSCOPY WITH BIOPSY;  Surgeon: Molli Hazard, MD;  Location: [facility];  Service: Urology;  Laterality: N/A;    TUR RESECTION OF POLYP BILATERAL RETROGRADE PYELOGRAM BLADDER BIOPSY    . Prostate biopsy N/A 06/21/2013    Procedure: BIOPSY TRANSRECTAL ULTRASONIC PROSTATE (TUBP);  Surgeon: Molli Hazard, MD;  Location: [facility];  Service: Urology;  Laterality: N/A;  PROSTATE NERVE BLOCK  . Appendectomy  1980  . Hip pinning,cannulated Right 06/30/2014    Procedure: CANNULATED HIP PINNING;  Surgeon: Renette Butters, MD;  Location: [place];  Service: Orthopedics;  Laterality: Right;  . Cardioversion N/A 02/27/2015  Procedure: CARDIOVERSION;  Surgeon: Jacolyn Reedy, MD;  Location: [facility];  Service: Cardiovascular;  Laterality: N/A;   Family History  Problem Relation Age of Onset  . Cancer Mother     breast  . Cancer Father     prostate, esophagus   Social History  Substance Use Topics  . Smoking status: Former Smoker -- 0.75 packs/day for 24 years    Types: Cigarettes    Quit date: 03/12/1968  . Smokeless tobacco: Former Systems developer    Types: Snuff, Chew    Quit date: 03/11/1968  . Alcohol Use: No    Review of Systems  Constitutional: Negative for fever and  chills.  Respiratory: Negative for cough and shortness of breath.   Cardiovascular: Negative for chest pain.  Gastrointestinal: Negative for nausea, vomiting, abdominal pain and diarrhea.  Musculoskeletal: Negative for back pain and neck pain.  Skin: Positive for wound.  Neurological: Negative for syncope, facial asymmetry, speech difficulty, weakness, light-headedness, numbness and headaches.  All other systems reviewed and are negative.     Allergies  Crestor; Lipitor; Penicillins; Tramadol; and Vicodin  Home Medications   Prior to Admission medications   Medication Sig Start Date End Date Taking? Authorizing Provider  amiodarone (PACERONE) 200 MG tablet Take 1 tablet (200 mg total) by mouth every morning. 02/28/15   Jacolyn Reedy, MD  atenolol (TENORMIN) 25 MG tablet Take 25 mg by mouth every morning.     Historical Provider, MD  Calcium Carb-Cholecalciferol (CALTRATE 600+D3 SOFT PO) Take 1 tablet by mouth 2 (two) times daily.     Historical Provider, MD  Darbepoetin Alfa (ARANESP) 100 MCG/0.5ML SOSY injection Inject 100 mcg into the skin every 21 ( twenty-one) days.    Historical Provider, MD  folic acid (FOLVITE) A999333 MCG tablet Take 400 mcg by mouth daily.      Historical Provider, MD  furosemide (LASIX) 80 MG tablet Take 1 tablet (80 mg total) by mouth 2 (two) times daily. Patient taking differently: Take 80-160 mg by mouth 2 (two) times daily. Takes two tablets in the morning and one tablet at dinner. 02/28/15   Jacolyn Reedy, MD  glimepiride (AMARYL) 1 MG tablet Take 1 tablet (1 mg total) by mouth daily before breakfast. 07/13/14   Bary Leriche, PA-C  Glucosamine HCl 1500 MG TABS Take 1,500 mg by mouth 2 (two) times daily.     Historical Provider, MD  Horse Chestnut 300 MG CAPS Take 300 mg by mouth daily.     Historical Provider, MD  Multiple Vitamin (MULTIVITAMIN WITH MINERALS) TABS tablet Take 1 tablet by mouth daily.    Historical Provider, MD  Omega-3 Fatty Acids (FISH  OIL) 1200 MG CAPS Take 1,200 mg by mouth daily.    Historical Provider, MD  Polyethylene Glycol 3350 (MIRALAX PO) Take 17 g by mouth daily.     Historical Provider, MD  sevelamer carbonate (RENVELA) 800 MG tablet Take 2 tablets (1,600 mg total) by mouth 3 (three) times daily with meals. Patient taking differently: Take 800 mg by mouth 3 (three) times daily with meals.  02/28/15   Jacolyn Reedy, MD  tamsulosin (FLOMAX) 0.4 MG CAPS capsule Take 0.4 mg by mouth daily.     Historical Provider, MD  warfarin (COUMADIN) 4 MG tablet Take 0.5-1 tablets (2-4 mg total) by mouth daily. Increase to 4 mg daily Patient taking differently: Take 4 mg by mouth daily.  02/28/15   Jacolyn Reedy, MD   BP 103/56 mmHg  Pulse 71  Temp(Src) 100.3 F (37.9 C) (Oral)  Resp 17  SpO2 95% Physical Exam  Constitutional: He is oriented to person, place, and time. He appears well-developed and well-nourished. No distress.  HENT:  Head: Normocephalic. Head is with contusion and with left periorbital erythema.    Right Ear: No hemotympanum.  Left Ear: No hemotympanum.  Nose: No nasal septal hematoma.  Normal [facility] per patient report in bilateral eyes. EOMI. No entrapment.   Eyes: EOM are normal. Pupils are equal, round, and reactive to light.  Cardiovascular: Normal rate, regular rhythm and intact distal pulses.   Pulmonary/Chest: Effort normal. No respiratory distress.  Abdominal: Soft. There is no tenderness.  Musculoskeletal: He exhibits no edema.       Arms: Neurological: He is alert and oriented to person, place, and time. He has normal strength. No cranial nerve deficit or sensory deficit. He exhibits normal muscle tone. He displays a negative Romberg sign. Coordination normal. GCS eye subscore is 4. GCS verbal subscore is 5. GCS motor subscore is 6.  Skin: Skin is warm and dry.    ED Course  Procedures (including critical care time) Labs Review Labs Reviewed  PROTIME-INR    Imaging Review Ct Head  Wo Contrast  07/19/2015  CLINICAL DATA:  Fall and hit head and face on side of car. Left head and facial pain and hematoma. EXAM: CT HEAD WITHOUT CONTRAST CT MAXILLOFACIAL WITHOUT CONTRAST TECHNIQUE: Multidetector CT imaging of the head and maxillofacial structures were performed using the standard protocol without intravenous contrast. Multiplanar CT image reconstructions of the maxillofacial structures were also generated. COMPARISON:  Head CT on 06/28/2014 FINDINGS: CT HEAD FINDINGS There is no evidence of intracranial hemorrhage, brain edema, or other signs of acute infarction. There is no evidence of intracranial mass lesion or mass effect. No abnormal extraaxial fluid collections are identified. Moderate diffuse cerebral atrophy is noted. No evidence of skull fracture or pneumocephalus. A large left frontal scalp hematoma is seen. CT MAXILLOFACIAL FINDINGS Left preseptal soft tissue swelling or hematoma is seen. No evidence of orbital or facial bone fracture. The globes and other intraorbital anatomy are normal in appearance. No evidence of orbital emphysema or sinus air-fluid levels. Mucosal thickening is seen involving the ethmoid and sphenoid sinuses bilaterally. IMPRESSION: No acute intracranial abnormality.  Moderate cerebral atrophy. Left frontal scalp and periorbital/preseptal soft tissue hematoma. No evidence of skull, orbital or facial bone fracture. Electronically Signed   By: Earle Gell M.D.   On: 07/19/2015 20:08   Ct Maxillofacial Wo Cm  07/19/2015  CLINICAL DATA:  Fall and hit head and face on side of car. Left head and facial pain and hematoma. EXAM: CT HEAD WITHOUT CONTRAST CT MAXILLOFACIAL WITHOUT CONTRAST TECHNIQUE: Multidetector CT imaging of the head and maxillofacial structures were performed using the standard protocol without intravenous contrast. Multiplanar CT image reconstructions of the maxillofacial structures were also generated. COMPARISON:  Head CT on 06/28/2014 FINDINGS: CT  HEAD FINDINGS There is no evidence of intracranial hemorrhage, brain edema, or other signs of acute infarction. There is no evidence of intracranial mass lesion or mass effect. No abnormal extraaxial fluid collections are identified. Moderate diffuse cerebral atrophy is noted. No evidence of skull fracture or pneumocephalus. A large left frontal scalp hematoma is seen. CT MAXILLOFACIAL FINDINGS Left preseptal soft tissue swelling or hematoma is seen. No evidence of orbital or facial bone fracture. The globes and other intraorbital anatomy are normal in appearance. No evidence of orbital emphysema or sinus  air-fluid levels. Mucosal thickening is seen involving the ethmoid and sphenoid sinuses bilaterally. IMPRESSION: No acute intracranial abnormality.  Moderate cerebral atrophy. Left frontal scalp and periorbital/preseptal soft tissue hematoma. No evidence of skull, orbital or facial bone fracture. Electronically Signed   By: Earle Gell M.D.   On: 07/19/2015 20:08   I have personally reviewed and evaluated these images and lab results as part of my medical decision-making.   EKG Interpretation None      MDM   Final diagnoses:  CAP (community acquired pneumonia)  Traumatic hematoma of forehead, initial encounter  Hypoxia    82 year old male with no pertinent past medical history presenting after a mechanical fall. Normally takes warfarin. Off of it for one day for preparation for surgery next week. INR here 1.9. Patient does have significant ecchymoses and hematoma to his left eye. Reports no changes in his vision. Extraocular movements intact. No evidence of entrapment. No evidence of globe rupture. No evidence of other acute pathology such has basilar skull fracture. CT head obtained with out evidence of intracranial hemorrhage or bleed. Provided them information regarding potential for delayed bleed; however, patient denying any headache or changes in his vision at this time. CT cervical spine  With no evidence of acute fracture.  When the patient was being reevaluated, as noted the patient had some degree of hypoxia.. Was nearly febrile to 100.3. Chest x-ray without evidence of pneumonia; however he does endorse a productive cough recently. We'll start the patient on antibiotics. We'll admit him to the hospital for new oxygen requirement.  Patient admitted in stable condition.    Maryan Puls, MD [id number]  David Glick, MD [id number]

## 2015-07-20 ENCOUNTER — Encounter (HOSPITAL_COMMUNITY)
Admission: RE | Admit: 2015-07-20 | Discharge: 2015-07-20 | Disposition: A | Discharge status: Home / Self Care | Payer: Medicare Other | Source: Ambulatory Visit | Attending: Internal Medicine | Admitting: Internal Medicine

## 2015-07-20 ENCOUNTER — Encounter (HOSPITAL_COMMUNITY): Payer: Self-pay | Admitting: Internal Medicine

## 2015-07-20 ENCOUNTER — Emergency Department (HOSPITAL_COMMUNITY): Payer: Medicare Other

## 2015-07-20 DIAGNOSIS — J9601 Acute respiratory failure with hypoxia: Secondary | ICD-10-CM | POA: Diagnosis not present

## 2015-07-20 DIAGNOSIS — I5032 Chronic diastolic (congestive) heart failure: Secondary | ICD-10-CM | POA: Diagnosis not present

## 2015-07-20 DIAGNOSIS — R0902 Hypoxemia: Secondary | ICD-10-CM | POA: Diagnosis not present

## 2015-07-20 DIAGNOSIS — W19XXXS Unspecified fall, sequela: Secondary | ICD-10-CM

## 2015-07-20 DIAGNOSIS — W19XXXA Unspecified fall, initial encounter: Secondary | ICD-10-CM | POA: Insufficient documentation

## 2015-07-20 DIAGNOSIS — S199XXA Unspecified injury of neck, initial encounter: Secondary | ICD-10-CM | POA: Diagnosis not present

## 2015-07-20 LAB — CBC WITH DIFFERENTIAL/PLATELET
BASOS ABS: 0 10*3/uL (ref 0.0–0.1)
BASOS PCT: 0 %
Basophils Absolute: 0 10*3/uL (ref 0.0–0.1)
Basophils Relative: 0 %
EOS ABS: 0 10*3/uL (ref 0.0–0.7)
EOS PCT: 0 %
Eosinophils Absolute: 0 10*3/uL (ref 0.0–0.7)
Eosinophils Relative: 0 %
HCT: 32.4 % — ABNORMAL LOW (ref 39.0–52.0)
HEMATOCRIT: 27.7 % — AB (ref 39.0–52.0)
Hemoglobin: 9.8 g/dL — ABNORMAL LOW (ref 13.0–17.0)
Hemoglobin: 8.4 g/dL — ABNORMAL LOW (ref 13.0–17.0)
LYMPHS ABS: 0.9 10*3/uL (ref 0.7–4.0)
LYMPHS PCT: 14 %
Lymphocytes Relative: 15 %
Lymphs Abs: 0.7 10*3/uL (ref 0.7–4.0)
MCH: 29.2 pg (ref 26.0–34.0)
MCH: 29.5 pg (ref 26.0–34.0)
MCHC: 30.2 g/dL (ref 30.0–36.0)
MCHC: 30.3 g/dL (ref 30.0–36.0)
MCV: 96.4 fL (ref 78.0–100.0)
MCV: 97.2 fL (ref 78.0–100.0)
MONO ABS: 0.5 10*3/uL (ref 0.1–1.0)
MONO ABS: 0.3 10*3/uL (ref 0.1–1.0)
MONOS PCT: 7 %
MONOS PCT: 6 %
NEUTROS ABS: 3.7 10*3/uL (ref 1.7–7.7)
Neutro Abs: 4.9 10*3/uL (ref 1.7–7.7)
Neutrophils Relative %: 79 %
Neutrophils Relative %: 79 %
PLATELETS: 199 10*3/uL (ref 150–400)
PLATELETS: 178 10*3/uL (ref 150–400)
RBC: 3.36 MIL/uL — ABNORMAL LOW (ref 4.22–5.81)
RBC: 2.85 MIL/uL — ABNORMAL LOW (ref 4.22–5.81)
RDW: 18.7 % — AB (ref 11.5–15.5)
RDW: 18.7 % — AB (ref 11.5–15.5)
WBC: 6.3 10*3/uL (ref 4.0–10.5)
WBC: 4.6 10*3/uL (ref 4.0–10.5)

## 2015-07-20 LAB — CBG MONITORING, ED: GLUCOSE-CAPILLARY: 123 mg/dL — AB (ref 65–99)

## 2015-07-20 LAB — COMPREHENSIVE METABOLIC PANEL
ALK PHOS: 43 U/L (ref 38–126)
ALT: 25 U/L (ref 17–63)
ANION GAP: 11 (ref 5–15)
AST: 43 U/L — ABNORMAL HIGH (ref 15–41)
Albumin: 2.8 g/dL — ABNORMAL LOW (ref 3.5–5.0)
BILIRUBIN TOTAL: 0.7 mg/dL (ref 0.3–1.2)
BUN: 66 mg/dL — ABNORMAL HIGH (ref 6–20)
CALCIUM: 8.4 mg/dL — AB (ref 8.9–10.3)
CO2: 26 mmol/L (ref 22–32)
CREATININE: 3.42 mg/dL — AB (ref 0.61–1.24)
Chloride: 102 mmol/L (ref 101–111)
GFR calc non Af Amer: 15 mL/min — ABNORMAL LOW (ref >60)
GFR, EST AFRICAN AMERICAN: 17 mL/min — AB (ref >60)
GLUCOSE: 144 mg/dL — AB (ref 65–99)
Potassium: 4.3 mmol/L (ref 3.5–5.1)
SODIUM: 139 mmol/L (ref 135–145)
TOTAL PROTEIN: 5.4 g/dL — AB (ref 6.5–8.1)

## 2015-07-20 LAB — GLUCOSE, CAPILLARY: Glucose-Capillary: 123 mg/dL — ABNORMAL HIGH (ref 65–99)

## 2015-07-20 LAB — TROPONIN I
TROPONIN I: 0.05 ng/mL — AB (ref <0.031)
Troponin I: 0.03 ng/mL (ref <0.031)
Troponin I: 0.03 ng/mL (ref <0.031)

## 2015-07-20 LAB — BASIC METABOLIC PANEL
Anion gap: 10 (ref 5–15)
BUN: 67 mg/dL — AB (ref 6–20)
CO2: 28 mmol/L (ref 22–32)
Calcium: 8.8 mg/dL — ABNORMAL LOW (ref 8.9–10.3)
Chloride: 102 mmol/L (ref 101–111)
Creatinine, Ser: 3.45 mg/dL — ABNORMAL HIGH (ref 0.61–1.24)
GFR calc Af Amer: 17 mL/min — ABNORMAL LOW (ref >60)
GFR calc non Af Amer: 15 mL/min — ABNORMAL LOW (ref >60)
GLUCOSE: 132 mg/dL — AB (ref 65–99)
POTASSIUM: 4.3 mmol/L (ref 3.5–5.1)
Sodium: 140 mmol/L (ref 135–145)

## 2015-07-20 LAB — BRAIN NATRIURETIC PEPTIDE: B Natriuretic Peptide: 282.8 pg/mL — ABNORMAL HIGH (ref 0.0–100.0)

## 2015-07-20 LAB — D-DIMER, QUANTITATIVE (NOT AT ARMC): D DIMER QUANT: 0.93 ug{FEU}/mL — AB (ref 0.00–0.50)

## 2015-07-20 MED ORDER — ONDANSETRON HCL 4 MG/2ML IJ SOLN: 4.0000 mg | Freq: Four times a day (QID) | INTRAMUSCULAR | Status: DC | PRN

## 2015-07-20 MED ORDER — OMEGA-3-ACID ETHYL ESTERS 1 G PO CAPS
1000.0000 mg | ORAL_CAPSULE | Freq: Every day | ORAL | Status: DC
  Filled 2015-07-20 (×2): qty 1

## 2015-07-20 MED ORDER — DOXYCYCLINE HYCLATE 100 MG PO TABS
100.0000 mg | ORAL_TABLET | Freq: Once | ORAL | Status: AC
  Administered 2015-07-20: 100 mg via ORAL
  Filled 2015-07-20: qty 1

## 2015-07-20 MED ORDER — FUROSEMIDE 80 MG PO TABS
160.0000 mg | ORAL_TABLET | ORAL | Status: DC
  Administered 2015-07-21: 160 mg via ORAL
  Filled 2015-07-20: qty 2

## 2015-07-20 MED ORDER — GLIMEPIRIDE 1 MG PO TABS
1.0000 mg | ORAL_TABLET | Freq: Every day | ORAL | Status: DC
  Administered 2015-07-20 – 2015-07-21 (×2): 1 mg via ORAL
  Filled 2015-07-20 (×2): qty 1

## 2015-07-20 MED ORDER — FUROSEMIDE 80 MG PO TABS
80.0000 mg | ORAL_TABLET | Freq: Every day | ORAL | Status: DC
  Administered 2015-07-20: 80 mg via ORAL
  Filled 2015-07-20: qty 1

## 2015-07-20 MED ORDER — OXYCODONE-ACETAMINOPHEN 5-325 MG PO TABS
1.0000 | ORAL_TABLET | Freq: Once | ORAL | Status: AC
  Administered 2015-07-20: 1 via ORAL
  Filled 2015-07-20: qty 1

## 2015-07-20 MED ORDER — ATENOLOL 25 MG PO TABS
25.0000 mg | ORAL_TABLET | Freq: Every morning | ORAL | Status: DC
  Administered 2015-07-20 – 2015-07-21 (×2): 25 mg via ORAL
  Filled 2015-07-20 (×2): qty 1

## 2015-07-20 MED ORDER — FUROSEMIDE 20 MG PO TABS
80.0000 mg | ORAL_TABLET | Freq: Two times a day (BID) | ORAL | Status: DC
  Administered 2015-07-20: 160 mg via ORAL
  Filled 2015-07-20 (×2): qty 4

## 2015-07-20 MED ORDER — ACETAMINOPHEN 325 MG PO TABS
650.0000 mg | ORAL_TABLET | Freq: Four times a day (QID) | ORAL | Status: DC | PRN
  Administered 2015-07-20: 650 mg via ORAL
  Filled 2015-07-20: qty 2

## 2015-07-20 MED ORDER — SODIUM CHLORIDE 0.9% FLUSH
3.0000 mL | Freq: Two times a day (BID) | INTRAVENOUS | Status: DC
  Administered 2015-07-20: 3 mL via INTRAVENOUS

## 2015-07-20 MED ORDER — AMIODARONE HCL 200 MG PO TABS
200.0000 mg | ORAL_TABLET | Freq: Every morning | ORAL | Status: DC
  Administered 2015-07-20 – 2015-07-21 (×2): 200 mg via ORAL
  Filled 2015-07-20 (×2): qty 1

## 2015-07-20 MED ORDER — DOXYCYCLINE HYCLATE 100 MG IV SOLR
100.0000 mg | Freq: Two times a day (BID) | INTRAVENOUS | Status: DC
Start: 2015-07-20 — End: 2015-07-21
  Filled 2015-07-20 (×3): qty 100

## 2015-07-20 MED ORDER — TAMSULOSIN HCL 0.4 MG PO CAPS
0.4000 mg | ORAL_CAPSULE | Freq: Every day | ORAL | Status: DC
  Filled 2015-07-20 (×2): qty 1

## 2015-07-20 MED ORDER — ACETAMINOPHEN 500 MG PO TABS: 1000.0000 mg | ORAL_TABLET | Freq: Once | ORAL | Status: DC

## 2015-07-20 MED ORDER — ONDANSETRON 4 MG PO TBDP
4.0000 mg | ORAL_TABLET | Freq: Once | ORAL | Status: AC
  Administered 2015-07-20: 4 mg via ORAL
  Filled 2015-07-20: qty 1

## 2015-07-20 MED ORDER — ACETAMINOPHEN 650 MG RE SUPP: 650.0000 mg | Freq: Four times a day (QID) | RECTAL | Status: DC | PRN

## 2015-07-20 MED ORDER — INSULIN ASPART 100 UNIT/ML ~~LOC~~ SOLN: 0.0000 [IU] | Freq: Three times a day (TID) | SUBCUTANEOUS | Status: DC

## 2015-07-20 MED ORDER — FOLIC ACID 0.5 MG HALF TAB
500.0000 ug | ORAL_TABLET | Freq: Every day | ORAL | Status: DC
  Filled 2015-07-20 (×2): qty 1

## 2015-07-20 MED ORDER — SEVELAMER CARBONATE 800 MG PO TABS
800.0000 mg | ORAL_TABLET | Freq: Three times a day (TID) | ORAL | Status: DC
  Filled 2015-07-20 (×5): qty 1

## 2015-07-20 MED ORDER — HORSE CHESTNUT 300 MG PO CAPS: 300.0000 mg | ORAL_CAPSULE | Freq: Every day | ORAL | Status: DC

## 2015-07-20 MED ORDER — ADULT MULTIVITAMIN W/MINERALS CH
1.0000 | ORAL_TABLET | Freq: Every day | ORAL | Status: DC
  Filled 2015-07-20 (×2): qty 1

## 2015-07-20 MED ORDER — DEXTROSE 5 % IV SOLN
1.0000 g | Freq: Once | INTRAVENOUS | Status: AC
  Administered 2015-07-20: 1 g via INTRAVENOUS
  Filled 2015-07-20: qty 10

## 2015-07-20 MED ORDER — ONDANSETRON HCL 4 MG PO TABS
4.0000 mg | ORAL_TABLET | Freq: Four times a day (QID) | ORAL | Status: DC | PRN
Start: 2015-07-20 — End: 2015-07-21

## 2015-07-20 MED ORDER — GLUCOSAMINE HCL 1500 MG PO TABS: 1500.0000 mg | ORAL_TABLET | Freq: Two times a day (BID) | ORAL | Status: DC

## 2015-07-20 NOTE — H&P (Signed)
History and Physical    Christopher [name] [id number] DOB: [birth year] DOA: 07/19/2015  PCP: Tivis Ringer, MD  Patient coming from: Home.  Chief Complaint: Fall.  HPI: Christopher [name] is a 82 y.o. male with medical history significant of CAD status post CABG, permanent pacemaker placement, diastolic CHF, chronic kidney disease stage IV who is scheduled to have a right hip surgery next week was brought to the ER after patient had a fall at his home. Patient states she was walking with a walker when suddenly he lost balance and fell onto his car parked at his driveway. Patient did not lose consciousness but had it his face. On exam patient has left periorbital hematoma and multiple ecchymotic areas on his face. CT of the head and neck and maxillofacial was done which showed left preseptal hematoma. While waiting in the ER patient became hypoxic requiring oxygen. Patient states he was having productive cough for last 2-3 days. Denies any chest pain. Patient also was mildly febrile. Since patient is requiring any oxygen patient was admitted for further observation. Patient was started on empiric on antibiotics for possible bronchitis/pneumonia since patient was hypoxic with fever. Chest x-ray does not show any infiltrates.   ED Course: Started on antibiotics.  Review of Systems: As per HPI otherwise 10 point review of systems negative.    Past Medical History  Diagnosis Date  . Lumbar disc disease   . BPH (benign prostatic hyperplasia)   . HTN (hypertension)   . Sinus node dysfunction (HCC)     symptomatic bradycardia  . Pacemaker     Medtronic-dual-chamber-DOI 2012  . CAD (coronary artery disease)     followed by Dr. Wynonia Lawman  . History of skin cancer   . Chronic kidney disease     kidneys function at "20 %" followed by Dr. Mercy Moore  . Prostate cancer ([place])   . Lumbar disc disease 1992  . Atrial fibrillation ([place]) 08/07/2011    CHADS score  2 CHADS2 VASC score 4  Onset winter of 2013  Cardioversion May 2013 Reversion to a fib 3/14 amiodarone begun 07/02/12  Repeat cardioversion Jul 16, 2012    . CAD (coronary artery disease) 03/06/2010    CABG with LIMA to LAD, SVG to OM-OM2  05/05/01 Dr. Roxan Hockey  Cardiac Cath 12/2009 60% left main, occluded LAD, Widely patent SVG to OM1 and 2, patent LIMA, widely patent RCA    . Cardiac pacemaker in situ     07/26/10  Inserted for chronotropic incompetence and dyspnea  RA lead  Medtronic 5076  serial number [id number]   RV lead  Medtronic 5076  serial number [id number] Medtronic Adapta RL pulse generator, SN  [id number] H    . Hypertensive heart disease without CHF     Past Surgical History  Procedure Laterality Date  . Coronary artery bypass graft  2003    x3  . Cardiac catheterization    . Lumbar laminectomy  11/01  . Cardioversion  08/08/2011    Procedure: CARDIOVERSION;  Surgeon: Jacolyn Reedy, MD;  Location: [facility];  Service: Cardiovascular;  Laterality: N/A;  . Cardioversion N/A 07/16/2012    Procedure: CARDIOVERSION;  Surgeon: Jacolyn Reedy, MD;  Location: [facility];  Service: Cardiovascular;  Laterality: N/A;  . Pacemaker insertion  2012  . Cataracts removed    . Cystoscopy with biopsy N/A 06/21/2013    Procedure: CYSTOSCOPY WITH BIOPSY;  Surgeon: Molli Hazard, MD;  Location: [facility];  Service: Urology;  Laterality: N/A;  TUR RESECTION OF POLYP BILATERAL RETROGRADE PYELOGRAM BLADDER BIOPSY    . Prostate biopsy N/A 06/21/2013    Procedure: BIOPSY TRANSRECTAL ULTRASONIC PROSTATE (TUBP);  Surgeon: Molli Hazard, MD;  Location: [facility];  Service: Urology;  Laterality: N/A;  PROSTATE NERVE BLOCK  . Appendectomy  1980  . Hip pinning,cannulated Right 06/30/2014    Procedure: CANNULATED HIP PINNING;  Surgeon: Renette Butters, MD;  Location: [place];  Service: Orthopedics;  Laterality: Right;  . Cardioversion N/A 02/27/2015    Procedure: CARDIOVERSION;  Surgeon: Jacolyn Reedy, MD;  Location: [facility];   Service: Cardiovascular;  Laterality: N/A;     reports that he quit smoking about 47 years ago. His smoking use included Cigarettes. He has a 18 pack-year smoking history. He quit smokeless tobacco use about 47 years ago. His smokeless tobacco use included Snuff and Chew. He reports that he does not drink alcohol or use illicit drugs.  Allergies  Allergen Reactions  . Crestor [Rosuvastatin] Other (See Comments)    Aches in joint   . Lipitor [Atorvastatin] Other (See Comments)    Joint aching  . Penicillins Rash and Other (See Comments)    Has patient had a PCN reaction causing immediate rash, facial/tongue/throat swelling, SOB or lightheadedness with hypotension: yes Has patient had a PCN reaction causing severe rash involving mucus membranes or skin necrosis: no Has patient had a PCN reaction that required hospitalization no Has patient had a PCN reaction occurring within the last 10 years: no If all of the above answers are "NO", then may proceed with Cephalosporin use.   . Tramadol Nausea Only  . Vicodin [Hydrocodone-Acetaminophen] Anxiety    "Made me nervous"    Family History  Problem Relation Age of Onset  . Cancer Mother     breast  . Cancer Father     prostate, esophagus    Prior to Admission medications   Medication Sig Start Date End Date Taking? Authorizing Provider  amiodarone (PACERONE) 200 MG tablet Take 1 tablet (200 mg total) by mouth every morning. 02/28/15  Yes Jacolyn Reedy, MD  atenolol (TENORMIN) 25 MG tablet Take 25 mg by mouth every morning.    Yes Historical Provider, MD  Darbepoetin Alfa (ARANESP) 100 MCG/0.5ML SOSY injection Inject 100 mcg into the skin every 21 ( twenty-one) days.   Yes Historical Provider, MD  furosemide (LASIX) 80 MG tablet Take 1 tablet (80 mg total) by mouth 2 (two) times daily. Patient taking differently: Take 80-160 mg by mouth 2 (two) times daily. Takes two tablets in the morning and one tablet at dinner. 02/28/15  Yes Jacolyn Reedy, MD  glimepiride (AMARYL) 1 MG tablet Take 1 tablet (1 mg total) by mouth daily before breakfast. 07/13/14  Yes Ivan Anchors Love, PA-C  Calcium Carb-Cholecalciferol (CALTRATE 600+D3 SOFT PO) Take 1 tablet by mouth 2 (two) times daily.     Historical Provider, MD  folic acid (FOLVITE) A999333 MCG tablet Take 400 mcg by mouth daily.      Historical Provider, MD  Glucosamine HCl 1500 MG TABS Take 1,500 mg by mouth 2 (two) times daily.     Historical Provider, MD  [place] 300 MG CAPS Take 300 mg by mouth daily.     Historical Provider, MD  Multiple Vitamin (MULTIVITAMIN WITH MINERALS) TABS tablet Take 1 tablet by mouth daily.    Historical Provider, MD  Omega-3 Fatty Acids (FISH OIL) 1200 MG CAPS Take 1,200 mg by mouth daily.  Historical Provider, MD  Polyethylene Glycol 3350 (MIRALAX PO) Take 17 g by mouth daily.     Historical Provider, MD  sevelamer carbonate (RENVELA) 800 MG tablet Take 2 tablets (1,600 mg total) by mouth 3 (three) times daily with meals. Patient taking differently: Take 800 mg by mouth 3 (three) times daily with meals.  02/28/15   Jacolyn Reedy, MD  tamsulosin (FLOMAX) 0.4 MG CAPS capsule Take 0.4 mg by mouth daily.     Historical Provider, MD  warfarin (COUMADIN) 4 MG tablet Take 0.5-1 tablets (2-4 mg total) by mouth daily. Increase to 4 mg daily Patient taking differently: Take 4 mg by mouth daily.  02/28/15   Jacolyn Reedy, MD    Physical Exam: Filed Vitals:   07/19/15 2330 07/20/15 0100 07/20/15 0130 07/20/15 0137  BP: 127/61 142/74 139/62   Pulse: 70 70 69   Temp:    99.8 F (37.7 C)  TempSrc:    Oral  Resp:      SpO2: 92% 96% 74%       Constitutional: Not in distress. Filed Vitals:   07/19/15 2330 07/20/15 0100 07/20/15 0130 07/20/15 0137  BP: 127/61 142/74 139/62   Pulse: 70 70 69   Temp:    99.8 F (37.7 C)  TempSrc:    Oral  Resp:      SpO2: 92% 96% 74%    Eyes: Left periorbital hematoma. ENMT: Multiple ecchymotic areas on the  face. Neck: No neck rigidity. No JVD appreciated. Respiratory: No rhonchi or crepitations. Cardiovascular: S1 and S2 heard. Abdomen: Soft nontender bowel sounds present. Musculoskeletal: No edema. Skin: Multiple ecchymotic areas on the face. Neurologic: Alert awake oriented to time place and person. Moves all extremities. Psychiatric: Appears normal.   Labs on Admission: I have personally reviewed following labs and imaging studies  CBC:  Recent Labs Lab 07/18/15 1304 07/20/15 0209  WBC  --  6.3  NEUTROABS  --  4.9  HGB 10.3* 9.8*  HCT  --  32.4*  MCV  --  96.4  PLT  --  123XX123   Basic Metabolic Panel:  Recent Labs Lab 07/20/15 0209  NA 140  K 4.3  CL 102  CO2 28  GLUCOSE 132*  BUN 67*  CREATININE 3.45*  CALCIUM 8.8*   GFR: CrCl cannot be calculated (Unknown ideal weight.). Liver Function Tests: No results for input(s): AST, ALT, ALKPHOS, BILITOT, PROT, ALBUMIN in the last 168 hours. No results for input(s): LIPASE, AMYLASE in the last 168 hours. No results for input(s): AMMONIA in the last 168 hours. Coagulation Profile:  Recent Labs Lab 07/19/15 2247  INR 1.95*   Cardiac Enzymes: No results for input(s): CKTOTAL, CKMB, CKMBINDEX, TROPONINI in the last 168 hours. BNP (last 3 results) No results for input(s): PROBNP in the last 8760 hours. HbA1C: No results for input(s): HGBA1C in the last 72 hours. CBG: No results for input(s): GLUCAP in the last 168 hours. Lipid Profile: No results for input(s): CHOL, HDL, LDLCALC, TRIG, CHOLHDL, LDLDIRECT in the last 72 hours. Thyroid Function Tests: No results for input(s): TSH, T4TOTAL, FREET4, T3FREE, THYROIDAB in the last 72 hours. Anemia Panel: No results for input(s): VITAMINB12, FOLATE, FERRITIN, TIBC, IRON, RETICCTPCT in the last 72 hours. Urine analysis:    Component Value Date/Time   COLORURINE YELLOW 06/29/2014 2147   APPEARANCEUR CLEAR 06/29/2014 2147   LABSPEC 1.017 06/29/2014 2147   PHURINE 5.0  06/29/2014 2147   GLUCOSEU NEGATIVE 06/29/2014 2147   HGBUR NEGATIVE  06/29/2014 2147   [place] NEGATIVE 06/29/2014 2147   [place] 06/29/2014 2147   PROTEINUR NEGATIVE 06/29/2014 2147   UROBILINOGEN 0.2 06/29/2014 2147   NITRITE NEGATIVE 06/29/2014 2147   LEUKOCYTESUR NEGATIVE 06/29/2014 2147   Sepsis Labs: @LABRCNTIP (procalcitonin:4,lacticidven:4) )No results found for this or any previous visit (from the past 240 hour(s)).   Radiological Exams on Admission: Dg Chest 2 View  07/20/2015  CLINICAL DATA:  Hypoxia tonight. EXAM: CHEST  2 VIEW COMPARISON:  06/19/2015 FINDINGS: There is moderate cardiomegaly. There is prior sternotomy and CABG. There are intact appearances of the transvenous cardiac leads. There is stable elevation the right hemidiaphragm. There is unchanged linear scarring in the bases. The lungs are otherwise clear. The pulmonary vasculature is normal. There is no pleural effusion. Hilar and mediastinal contours are unremarkable and unchanged. IMPRESSION: Cardiomegaly.  No acute findings. Electronically Signed   By: Andreas Newport M.D.   On: 07/20/2015 02:20   Ct Head Wo Contrast  07/19/2015  CLINICAL DATA:  Fall and hit head and face on side of car. Left head and facial pain and hematoma. EXAM: CT HEAD WITHOUT CONTRAST CT MAXILLOFACIAL WITHOUT CONTRAST TECHNIQUE: Multidetector CT imaging of the head and maxillofacial structures were performed using the standard protocol without intravenous contrast. Multiplanar CT image reconstructions of the maxillofacial structures were also generated. COMPARISON:  Head CT on 06/28/2014 FINDINGS: CT HEAD FINDINGS There is no evidence of intracranial hemorrhage, brain edema, or other signs of acute infarction. There is no evidence of intracranial mass lesion or mass effect. No abnormal extraaxial fluid collections are identified. Moderate diffuse cerebral atrophy is noted. No evidence of skull fracture or pneumocephalus. A large  left frontal scalp hematoma is seen. CT MAXILLOFACIAL FINDINGS Left preseptal soft tissue swelling or hematoma is seen. No evidence of orbital or facial bone fracture. The globes and other intraorbital anatomy are normal in appearance. No evidence of orbital emphysema or sinus air-fluid levels. Mucosal thickening is seen involving the ethmoid and sphenoid sinuses bilaterally. IMPRESSION: No acute intracranial abnormality.  Moderate cerebral atrophy. Left frontal scalp and periorbital/preseptal soft tissue hematoma. No evidence of skull, orbital or facial bone fracture. Electronically Signed   By: Earle Gell M.D.   On: 07/19/2015 20:08   Ct Cervical Spine Wo Contrast  07/20/2015  CLINICAL DATA:  82 year old male with fall EXAM: CT CERVICAL SPINE WITHOUT CONTRAST TECHNIQUE: Multidetector CT imaging of the cervical spine was performed without intravenous contrast. Multiplanar CT image reconstructions were also generated. COMPARISON:  None. FINDINGS: There is no acute fracture or subluxation of the cervical spine.There are multilevel degenerative changes with facet hypertrophy. There multilevel bilateral neural foramina stenosis most prominent at left C3-C4 and right C4-C5.The odontoid and spinous processes are intact.There is normal anatomic alignment of the C1-C2 lateral masses. The visualized soft tissues appear unremarkable. IMPRESSION: No acute/traumatic cervical pathology. Multilevel degenerative changes. Electronically Signed   By: Anner Crete M.D.   On: 07/20/2015 01:12   Ct Maxillofacial Wo Cm  07/19/2015  CLINICAL DATA:  Fall and hit head and face on side of car. Left head and facial pain and hematoma. EXAM: CT HEAD WITHOUT CONTRAST CT MAXILLOFACIAL WITHOUT CONTRAST TECHNIQUE: Multidetector CT imaging of the head and maxillofacial structures were performed using the standard protocol without intravenous contrast. Multiplanar CT image reconstructions of the maxillofacial structures were also  generated. COMPARISON:  Head CT on 06/28/2014 FINDINGS: CT HEAD FINDINGS There is no evidence of intracranial hemorrhage, brain edema, or other signs of acute infarction.  There is no evidence of intracranial mass lesion or mass effect. No abnormal extraaxial fluid collections are identified. Moderate diffuse cerebral atrophy is noted. No evidence of skull fracture or pneumocephalus. A large left frontal scalp hematoma is seen. CT MAXILLOFACIAL FINDINGS Left preseptal soft tissue swelling or hematoma is seen. No evidence of orbital or facial bone fracture. The globes and other intraorbital anatomy are normal in appearance. No evidence of orbital emphysema or sinus air-fluid levels. Mucosal thickening is seen involving the ethmoid and sphenoid sinuses bilaterally. IMPRESSION: No acute intracranial abnormality.  Moderate cerebral atrophy. Left frontal scalp and periorbital/preseptal soft tissue hematoma. No evidence of skull, orbital or facial bone fracture. Electronically Signed   By: Earle Gell M.D.   On: 07/19/2015 20:08    EKG: Independently reviewed. Paced rhythm.  Assessment/Plan Principal Problem:   Hypoxia Active Problems:   CAD (coronary artery disease)   Atrial fibrillation (HCC)   Chronic kidney disease (CKD), stage IV (severe) (HCC)   Chronic diastolic congestive heart failure, NYHA class 2 (HCC)   DM type 2 causing renal disease ([place])    #1. Hypoxia - cause not clear. Patient denies any chest pain but did have some productive cough and was mildly febrile. Patient has been empirically placed on antibiotics for possible bronchitis/pneumonia. Check d-dimer. Patient is on Coumadin which has been held since yesterday for possible surgery of the right hip next week. Continue with home dose of Lasix 160 mg in the morning and 80 mg in the evening for diastolic CHF and chronic kidney disease. Patient looks euvolemic at this time. #2. CAD status post CABG - denies any chest pain. Check  troponin. #3. Chronic diastolic CHF last EF measured in 2015 was 55-60% - continue Lasix. Patient looks euvolemic at this time. Closely follow intake and output and metabolic panel. #4. Chronic disease stage IV - creatinine appears to be at baseline. Continue Lasix and closely follow intake and output and metabolic panel. #5. Diabetes mellitus type 2 - on Amaryl. Closely follow CBGs with sliding scale coverage. #6. Paroxysmal atrial fibrillation status post pacemaker placement - Coumadin on hold in anticipation of possible surgery next week for the right hip. Chads 2 vasc score more than 2. Continue amiodarone and metoprolol. #7. Chronic anemia - follow CBC. #8. Fall with multiple ecchymotic areas on the face and left periorbital hematoma - patient is able to move his eyes without difficulty. Closely observe.   DVT prophylaxis: SCDs. Code Status: Full code.  Family Communication: Patient's wife.  Disposition Plan: Home.  Consults called: None.  Admission status: Observation. Telemetry.    Rise Patience MD [facility] Pager [phone].  If 7PM-7AM, please contact night-coverage [email or web address] Password [email or web address]  07/20/2015, 5:18 AM

## 2015-07-20 NOTE — Progress Notes (Signed)
Patient lying in bed, wife present at bedside. No needs at this time. Retook blood pressure, patient is asymptomatic. States that it runs low. Call light within reach

## 2015-07-20 NOTE — Progress Notes (Addendum)
Patient ID: Christopher [name], male   DOB: 11/28/1926, 82 y.o.   MRN: [id number]  PROGRESS NOTE    Christopher [name]  [id number] DOB: 12-06-1926 DOA: 07/19/2015  PCP: Tivis Ringer, MD   Brief Narrative:   Patient admitted after midnight. For details, please refer to admission note completed 07/20/2015.  82 y.o. male with medical history significant for CAD status post CABG, permanent pacemaker placement, diastolic CHF, chronic kidney disease stage IV. A shunt was scheduled to have right hip surgery next week but presented to [facility] because of fall at home. Patient was ambulating with the walker when he lost his balance and fell.   Patient was hemodynamically stable on the admission. He has left periorbital hematoma and multiple ecchymotic areas on face. CT head and neck and maxillofacial CT showed left preseptal hematoma.  While in ED, patient had an episode of hypoxia which has improved with nasal cannula oxygen support. Patient did report having a cough productive of whitish sputum over the last few days prior to this admission. He did have low grade fever in ED 100.3 F. Patient was started on empiric antibiotic for possible pneumonia. Chest x-ray did not show infiltrates.   Assessment & Plan:   Principal Problem: Mechanical fall / left frontal skull and periorbital/preseptal soft tissue hematoma - Patient is status post mechanical fall at home - CT head showed periorbital/preseptal soft tissue hematoma otherwise no acute intracranial abnormalities - Continue to monitor mental status, provide supportive care  - Obtain physical therapy for safe discharge planning   Active Problems: Elevated troponin level - Mild troponin elevation, 0.05. No reports of chest pain - Likely demand ischemia from fall and chronic kidney disease - The 12-lead EKG showed atrial paced rhythm  Acute respiratory failure with hypoxia / probable lobar pneumonia, unspecified organism - Chest x-ray  did not show acute infiltrates but patient did have reports of productive cough and will have low-grade fever in the emergency room for which reason empiric doxycycline started  Chronic kidney disease stage IV - Recent creatinine baseline, 3.88 and creatinine on this admission 3.4, in baseline range   DVT prophylaxis: SCDs due to risk of bleeding Code Status: full code  Family Communication: Family not at the bedside this morning Disposition Plan: will need PT eval for safe discharge plan    Consultants:   None   Procedures:   None   Antimicrobials:   Doxycycline 07/20/2015 -->   Subjective: No overnight events.   Objective: Filed Vitals:   07/20/15 0430 07/20/15 0515 07/20/15 0600 07/20/15 0615  BP: 112/57 119/61 105/52 116/62  Pulse: 69 77 70 77  Temp:      TempSrc:      Resp:  23 22 23   SpO2: 98% 97% 97% 97%   No intake or output data in the 24 hours ending 07/20/15 0930 There were no vitals filed for this visit.  Examination:  General exam: Appears calm and comfortable  Respiratory system: Clear to auscultation. Respiratory effort normal. Cardiovascular system: S1 & S2 heard, RRR. No JVD Gastrointestinal system: Abdomen is nondistended, soft and nontender. No organomegaly or masses felt. Normal bowel sounds heard. Central nervous system: Alert and oriented. No focal neurological deficits. Extremities: Symmetric 5 x 5 power. Skin: No rashes, lesions or ulcers Psychiatry: Judgement and insight appear normal. Mood & affect appropriate.   Data Reviewed: I have personally reviewed following labs and imaging studies  CBC:  Recent Labs Lab 07/18/15 1304 07/20/15 0209 07/20/15 [id number]  WBC  --  6.3 4.6  NEUTROABS  --  4.9 3.7  HGB 10.3* 9.8* 8.4*  HCT  --  32.4* 27.7*  MCV  --  96.4 97.2  PLT  --  199 0000000   Basic Metabolic Panel:  Recent Labs Lab 07/20/15 0209 07/20/15 0611  NA 140 139  K 4.3 4.3  CL 102 102  CO2 28 26  GLUCOSE 132* 144*  BUN 67*  66*  CREATININE 3.45* 3.42*  CALCIUM 8.8* 8.4*   GFR: CrCl cannot be calculated (Unknown ideal weight.). Liver Function Tests:  Recent Labs Lab 07/20/15 0611  AST 43*  ALT 25  ALKPHOS 43  BILITOT 0.7  PROT 5.4*  ALBUMIN 2.8*   No results for input(s): LIPASE, AMYLASE in the last 168 hours. No results for input(s): AMMONIA in the last 168 hours. Coagulation Profile:  Recent Labs Lab 07/19/15 2247  INR 1.95*   Cardiac Enzymes:  Recent Labs Lab 07/20/15 0611  TROPONINI 0.05*   BNP (last 3 results) No results for input(s): PROBNP in the last 8760 hours. HbA1C: No results for input(s): HGBA1C in the last 72 hours. CBG: No results for input(s): GLUCAP in the last 168 hours. Lipid Profile: No results for input(s): CHOL, HDL, LDLCALC, TRIG, CHOLHDL, LDLDIRECT in the last 72 hours. Thyroid Function Tests: No results for input(s): TSH, T4TOTAL, FREET4, T3FREE, THYROIDAB in the last 72 hours. Anemia Panel: No results for input(s): VITAMINB12, FOLATE, FERRITIN, TIBC, IRON, RETICCTPCT in the last 72 hours. Urine analysis:    Component Value Date/Time   COLORURINE YELLOW 06/29/2014 2147   APPEARANCEUR CLEAR 06/29/2014 2147   LABSPEC 1.017 06/29/2014 2147   PHURINE 5.0 06/29/2014 2147   GLUCOSEU NEGATIVE 06/29/2014 2147   HGBUR NEGATIVE 06/29/2014 2147   BILIRUBINUR NEGATIVE 06/29/2014 2147   [place] NEGATIVE 06/29/2014 2147   PROTEINUR NEGATIVE 06/29/2014 2147   UROBILINOGEN 0.2 06/29/2014 2147   NITRITE NEGATIVE 06/29/2014 2147   LEUKOCYTESUR NEGATIVE 06/29/2014 2147   Sepsis Labs: @LABRCNTIP (procalcitonin:4,lacticidven:4)   )No results found for this or any previous visit (from the past 240 hour(s)).    Radiology Studies: Dg Chest 2 View 07/20/2015  Cardiomegaly.  No acute findings. Electronically Signed   By: Andreas Newport M.D.   On: 07/20/2015 02:20   Ct Head Wo Contrast 07/19/2015   No acute intracranial abnormality.  Moderate cerebral atrophy.  Left frontal scalp and periorbital/preseptal soft tissue hematoma. No evidence of skull, orbital or facial bone fracture. Electronically Signed   By: Earle Gell M.D.   On: 07/19/2015 20:08   Ct Cervical Spine Wo Contrast 07/20/2015  No acute/traumatic cervical pathology. Multilevel degenerative changes. Electronically Signed   By: Anner Crete M.D.   On: 07/20/2015 01:12   Ct Maxillofacial Wo Cm 07/19/2015   No acute intracranial abnormality.  Moderate cerebral atrophy. Left frontal scalp and periorbital/preseptal soft tissue hematoma. No evidence of skull, orbital or facial bone fracture. Electronically Signed   By: Earle Gell M.D.   On: 07/19/2015 20:08    Scheduled Meds: . amiodarone  200 mg Oral q morning - 10a  . atenolol  25 mg Oral q morning - 123XX123  . folic acid  [id number] mcg Oral Daily  . furosemide  80-160 mg Oral BID  . glimepiride  1 mg Oral QAC breakfast  . insulin aspart  0-9 Units Subcutaneous TID WC  . multivitamin with minerals  1 tablet Oral Daily  . omega-3 acid ethyl esters  1,000 mg Oral Daily  .  sevelamer carbonate  800 mg Oral TID WC  . sodium chloride flush  3 mL Intravenous Q12H  . tamsulosin  0.4 mg Oral Daily   Continuous Infusions: . doxycycline (VIBRAMYCIN) IV      Time spent: 25 minutes  Greater than 50% of the time spent on counseling and coordinating the care.   Leisa Lenz, MD [facility] Pager [phone]  If 7PM-7AM, please contact night-coverage [email or web address] Password [email or web address] 07/20/2015, 9:30 AM

## 2015-07-20 NOTE — ED Notes (Signed)
Pt desated to 70's this RN placed pt on 2L. Pt O2 sat increased to 94

## 2015-07-20 NOTE — ED Provider Notes (Signed)
I received this patient in signout. We were awaiting CT cervical spine imaging. Ct negative for acute injury. While awaiting imaging, the patient was noted to become hypoxic, lowest to 74% on room air while awake. This was prior to receiving Percocet for head pain from fall. He was placed on 2 L nasal cannula. I reviewed his vital signs here which show borderline fever at one point to 100.3. He states that he's had several days of cough productive of yellow phlegm and cold symptoms as well as generalized malaise. Obtained chest x-ray which did not show acute infiltrate. Basic labs showed normal WBC count and BNP less than 500, therefore I doubt CHF as cause of hypoxia. Based on his borderline fever and productive cough, I suspect occult pneumonia. I attempted to take him off of oxygen but his saturations dropped to 86% with good waveform on the monitor. Put him back on 2 L and discussed observation admission, patient and family are in agreement. Gave ceftriaxone and doxycycline. Discussed admission with [facility], Dr. Hal Hope, and pt admitted for further care.  Sharlett Iles, MD 07/20/15 [phone]

## 2015-07-20 NOTE — ED Notes (Signed)
Pt states he never takes insulin, doesn't wish to start.

## 2015-07-20 NOTE — ED Notes (Signed)
Pt states he is not supposed to take any vitamins per orthopedic surgeon, pt also states urologist stopped his flomax yesterday. Pt states is not taking Renvela at this time either,

## 2015-07-21 DIAGNOSIS — E1169 Type 2 diabetes mellitus with other specified complication: Secondary | ICD-10-CM

## 2015-07-21 DIAGNOSIS — J9601 Acute respiratory failure with hypoxia: Secondary | ICD-10-CM | POA: Diagnosis not present

## 2015-07-21 DIAGNOSIS — E1121 Type 2 diabetes mellitus with diabetic nephropathy: Secondary | ICD-10-CM

## 2015-07-21 DIAGNOSIS — E785 Hyperlipidemia, unspecified: Secondary | ICD-10-CM

## 2015-07-21 DIAGNOSIS — N184 Chronic kidney disease, stage 4 (severe): Secondary | ICD-10-CM | POA: Diagnosis not present

## 2015-07-21 DIAGNOSIS — I5032 Chronic diastolic (congestive) heart failure: Secondary | ICD-10-CM

## 2015-07-21 LAB — BASIC METABOLIC PANEL
ANION GAP: 12 (ref 5–15)
BUN: 70 mg/dL — ABNORMAL HIGH (ref 6–20)
CALCIUM: 8.8 mg/dL — AB (ref 8.9–10.3)
CO2: 31 mmol/L (ref 22–32)
Chloride: 101 mmol/L (ref 101–111)
Creatinine, Ser: 3.38 mg/dL — ABNORMAL HIGH (ref 0.61–1.24)
GFR, EST AFRICAN AMERICAN: 18 mL/min — AB (ref >60)
GFR, EST NON AFRICAN AMERICAN: 15 mL/min — AB (ref >60)
Glucose, Bld: 106 mg/dL — ABNORMAL HIGH (ref 65–99)
Potassium: 5 mmol/L (ref 3.5–5.1)
SODIUM: 144 mmol/L (ref 135–145)

## 2015-07-21 LAB — CBC
HCT: 31 % — ABNORMAL LOW (ref 39.0–52.0)
HEMOGLOBIN: 9.2 g/dL — AB (ref 13.0–17.0)
MCH: 28.9 pg (ref 26.0–34.0)
MCHC: 29.7 g/dL — AB (ref 30.0–36.0)
MCV: 97.5 fL (ref 78.0–100.0)
Platelets: 175 10*3/uL (ref 150–400)
RBC: 3.18 MIL/uL — AB (ref 4.22–5.81)
RDW: 18.7 % — ABNORMAL HIGH (ref 11.5–15.5)
WBC: 4.5 10*3/uL (ref 4.0–10.5)

## 2015-07-21 LAB — GLUCOSE, CAPILLARY
GLUCOSE-CAPILLARY: 103 mg/dL — AB (ref 65–99)
GLUCOSE-CAPILLARY: 117 mg/dL — AB (ref 65–99)

## 2015-07-21 MED ORDER — DM-GUAIFENESIN ER 30-600 MG PO TB12
1.0000 | ORAL_TABLET | Freq: Two times a day (BID) | ORAL | Status: DC
  Administered 2015-07-21: 1 via ORAL
  Filled 2015-07-21: qty 1

## 2015-07-21 MED ORDER — GUAIFENESIN-DM 100-10 MG/5ML PO SYRP
5.0000 mL | ORAL_SOLUTION | ORAL | Status: DC | PRN
  Administered 2015-07-21: 5 mL via ORAL
  Filled 2015-07-21: qty 5

## 2015-07-21 MED ORDER — ACETAMINOPHEN 325 MG PO TABS: 650.0000 mg | ORAL_TABLET | Freq: Four times a day (QID) | ORAL | Status: DC | PRN

## 2015-07-21 MED ORDER — DM-GUAIFENESIN ER 30-600 MG PO TB12: 1.0000 | ORAL_TABLET | Freq: Two times a day (BID) | ORAL | Status: DC | PRN

## 2015-07-21 MED ORDER — FOLIC ACID 1 MG PO TABS
1.0000 mg | ORAL_TABLET | Freq: Every day | ORAL | Status: DC
  Filled 2015-07-21: qty 1

## 2015-07-21 NOTE — Discharge Instructions (Signed)
Fall Prevention in the Home  Falls can cause injuries and can affect people from all age groups. There are many simple things that you can do to make your home safe and to help prevent falls. WHAT CAN I DO ON THE OUTSIDE OF MY HOME?  Regularly repair the edges of walkways and driveways and fix any cracks.  Remove high doorway thresholds.  Trim any shrubbery on the main path into your home.  Use bright outdoor lighting.  Clear walkways of debris and clutter, including tools and rocks.  Regularly check that handrails are securely fastened and in good repair. Both sides of any steps should have handrails.  Install guardrails along the edges of any raised decks or porches.  Have leaves, snow, and ice cleared regularly.  Use sand or salt on walkways during winter months.  In the garage, clean up any spills right away, including grease or oil spills. WHAT CAN I DO IN THE BATHROOM?  Use night lights.  Install grab bars by the toilet and in the tub and shower. Do not use towel bars as grab bars.  Use non-skid mats or decals on the floor of the tub or shower.  If you need to sit down while you are in the shower, use a plastic, non-slip stool..  Keep the floor dry. Immediately clean up any water that spills on the floor.  Remove soap buildup in the tub or shower on a regular basis.  Attach bath mats securely with double-sided non-slip rug tape.  Remove throw rugs and other tripping hazards from the floor. WHAT CAN I DO IN THE BEDROOM?  Use night lights.  Make sure that a bedside light is easy to reach.  Do not use oversized bedding that drapes onto the floor.  Have a firm chair that has side arms to use for getting dressed.  Remove throw rugs and other tripping hazards from the floor. WHAT CAN I DO IN THE KITCHEN?   Clean up any spills right away.  Avoid walking on wet floors.  Place frequently used items in easy-to-reach places.  If you need to reach for something  above you, use a sturdy step stool that has a grab bar.  Keep electrical cables out of the way.  Do not use floor polish or wax that makes floors slippery. If you have to use wax, make sure that it is non-skid floor wax.  Remove throw rugs and other tripping hazards from the floor. WHAT CAN I DO IN THE STAIRWAYS?  Do not leave any items on the stairs.  Make sure that there are handrails on both sides of the stairs. Fix handrails that are broken or loose. Make sure that handrails are as long as the stairways.  Check any carpeting to make sure that it is firmly attached to the stairs. Fix any carpet that is loose or worn.  Avoid having throw rugs at the top or bottom of stairways, or secure the rugs with carpet tape to prevent them from moving.  Make sure that you have a light switch at the top of the stairs and the bottom of the stairs. If you do not have them, have them installed. WHAT ARE SOME OTHER FALL PREVENTION TIPS?  Wear closed-toe shoes that fit well and support your feet. Wear shoes that have rubber soles or low heels.  When you use a stepladder, make sure that it is completely opened and that the sides are firmly locked. Have someone hold the ladder while you   are using it. Do not climb a closed stepladder.  Add color or contrast paint or tape to grab bars and handrails in your home. Place contrasting color strips on the first and last steps.  Use mobility aids as needed, such as canes, walkers, scooters, and crutches.  Turn on lights if it is dark. Replace any light bulbs that burn out.  Set up furniture so that there are clear paths. Keep the furniture in the same spot.  Fix any uneven floor surfaces.  Choose a carpet design that does not hide the edge of steps of a stairway.  Be aware of any and all pets.  Review your medicines with your healthcare provider. Some medicines can cause dizziness or changes in blood pressure, which increase your risk of falling. Talk  with your health care provider about other ways that you can decrease your risk of falls. This may include working with a physical therapist or trainer to improve your strength, balance, and endurance.   This information is not intended to replace advice given to you by your health care provider. Make sure you discuss any questions you have with your health care provider.   Document Released: 02/15/2002 Document Revised: 07/12/2014 Document Reviewed: 04/01/2014 Elsevier Interactive Patient Education 2016 [employer]  

## 2015-07-21 NOTE — Discharge Summary (Addendum)
Physician Discharge Summary  Christopher [name] [id number] DOB: [birth year] DOA: 07/19/2015  PCP: Tivis Ringer, MD  Admit date: 07/19/2015 Discharge date: 07/21/2015  Recommendations for Outpatient Follow-up:  Please avoid aspirin. Also, Coumadin is on hold prior to patient's surgery. Please do not take Coumadin until after the surgery.  Discharge Diagnoses:  Principal Problem:   Hypoxia Active Problems:   CAD (coronary artery disease)   Atrial fibrillation (HCC)   Chronic kidney disease (CKD), stage IV (severe) (HCC)   Chronic diastolic congestive heart failure, NYHA class 2 (HCC)   DM type 2 causing renal disease ([address])    Discharge Condition: stable   Diet recommendation: as tolerated   History of present illness:  82 y.o. male with medical history significant for CAD status post CABG, permanent pacemaker placement, diastolic CHF, chronic kidney disease stage IV. A shunt was scheduled to have right hip surgery next week but presented to [facility] because of fall at home. Patient was ambulating with the walker when he lost his balance and fell.   Patient was hemodynamically stable on the admission. He has left periorbital hematoma and multiple ecchymotic areas on face. CT head and neck and maxillofacial CT showed left preseptal hematoma.  While in ED, patient had an episode of hypoxia which has improved with nasal cannula oxygen support. Patient did report having a cough productive of whitish sputum over the last few days prior to this admission. He did have low grade fever in ED 100.3 F. Patient was started on empiric antibiotic for possible pneumonia. Chest x-ray did not show infiltrates.  Hospital Course:   Assessment & Plan:  Principal Problem: Mechanical fall / left frontal skull and periorbital/preseptal soft tissue hematoma - Patient is status post mechanical fall at home - CT head showed periorbital/preseptal soft tissue hematoma otherwise no acute  intracranial abnormalities - Good mental status - PT eval prior to discharge for safe discharge plan  - Tylenol for pain control, avoid aspirin due to risk of bleed   Active Problems: Elevated troponin level - Mild troponin elevation, 0.05. No reports of chest pain - Likely demand ischemia from fall and chronic kidney disease - The 12-lead EKG showed atrial paced rhythm  Acute respiratory failure with hypoxia / probable lobar pneumonia, unspecified organism - Chest x-ray did not show acute infiltrates but patient did have reports of productive cough and will have low-grade fever in the emergency room for which reason empiric doxycycline started - No further fever, normal WBC count so will stop doxycycline today   Chronic kidney disease stage IV - Recent creatinine baseline, 3.88 and creatinine on this admission 3.4, in baseline range - Continue sevelamer, folic acid  Chronic atrial fibrillation - CHADS vasc score 4 - Continue amiodarone for heart rate control - Coumadin on hold prior to patient's surgery coming up next week  Essential hypertension - Continue atenolol 25 mg daily  Chronic diastolic congestive heart failure - Compensated - Continue Lasix per home regimen  Dyslipidemia associated with type 2 diabetes mellitus - Continue omega-3  Diabetes mellitus with diabetic nephropathy without long-term insulin use - Continue Amaryl per home regimen   DVT prophylaxis: SCDs due to risk of bleeding Code Status: full code  Family Communication: Wife at the bedside this morning   Consultants:   None  Procedures:   None  Antimicrobials:   Doxycycline 07/20/2015 --> 07/21/2015   Signed:  Leisa Lenz, MD  [facility] 07/21/2015, 9:43 AM  Pager #: [phone]  Time spent  in minutes: more than 30 minutes    Discharge Exam: Filed Vitals:   07/20/15 2217 07/21/15 0454  BP: 100/49 108/55  Pulse: 70 72  Temp:  98.8 F (37.1 C)  Resp:  16   Filed  Vitals:   07/20/15 2128 07/20/15 2135 07/20/15 2217 07/21/15 0454  BP: 88/45 83/38 100/49 108/55  Pulse: 70  70 72  Temp: 99.4 F (37.4 C)   98.8 F (37.1 C)  TempSrc: Oral   Oral  Resp: 18   16  Height:      Weight:    102.967 kg (227 lb)  SpO2: 94%   100%    General: Pt is alert, follows commands appropriately, not in acute distress, facial hematoma appreciated but slowly improving  Cardiovascular: Regular rate and rhythm, S1/S2 +, no murmurs Respiratory: Clear to auscultation bilaterally, no wheezing, no crackles, no rhonchi Abdominal: Soft, non tender, non distended, bowel sounds +, no guarding Extremities: no cyanosis, pulses palpable bilaterally DP and PT Neuro: Grossly nonfocal  Discharge Instructions  Discharge Instructions    Call MD for:  difficulty breathing, headache or visual disturbances    Complete by:  As directed      Call MD for:  persistant dizziness or light-headedness    Complete by:  As directed      Call MD for:  persistant nausea and vomiting    Complete by:  As directed      Call MD for:  severe uncontrolled pain    Complete by:  As directed      Diet - low sodium heart healthy    Complete by:  As directed      Discharge instructions    Complete by:  As directed   Please avoid aspirin. Also, Coumadin is on hold prior to patient's surgery. Please do not take Coumadin until after the surgery.     Increase activity slowly    Complete by:  As directed             Medication List    TAKE these medications        acetaminophen 325 MG tablet  Commonly known as:  TYLENOL  Take 2 tablets (650 mg total) by mouth every 6 (six) hours as needed for mild pain (or Fever >/= 101).     amiodarone 200 MG tablet  Commonly known as:  PACERONE  Take 1 tablet (200 mg total) by mouth every morning.     atenolol 25 MG tablet  Commonly known as:  TENORMIN  Take 25 mg by mouth every morning.     CALTRATE 600+D3 SOFT PO  Take 1 tablet by mouth 2 (two) times  daily.     Darbepoetin Alfa 100 MCG/0.5ML Sosy injection  Commonly known as:  ARANESP  Inject 100 mcg into the skin every 21 ( twenty-one) days.     dextromethorphan-guaiFENesin 30-600 MG 12hr tablet  Commonly known as:  MUCINEX DM  Take 1 tablet by mouth 2 (two) times daily as needed for cough.     Fish Oil 1200 MG Caps  Take 1,200 mg by mouth daily.     folic acid A999333 MCG tablet  Commonly known as:  FOLVITE  Take 400 mcg by mouth daily.     furosemide 80 MG tablet  Commonly known as:  LASIX  Take 1 tablet (80 mg total) by mouth 2 (two) times daily.     glimepiride 1 MG tablet  Commonly known as:  AMARYL  Take 1  tablet (1 mg total) by mouth daily before breakfast.     Glucosamine HCl 1500 MG Tabs  Take 1,500 mg by mouth 2 (two) times daily.     [place] 300 MG Caps  Take 300 mg by mouth daily.     MIRALAX PO  Take 17 g by mouth daily.     multivitamin with minerals Tabs tablet  Take 1 tablet by mouth daily.     sevelamer carbonate 800 MG tablet  Commonly known as:  RENVELA  Take 2 tablets (1,600 mg total) by mouth 3 (three) times daily with meals.     tamsulosin 0.4 MG Caps capsule  Commonly known as:  FLOMAX  Take 0.4 mg by mouth daily.     warfarin 4 MG tablet  Commonly known as:  COUMADIN  Take 0.5-1 tablets (2-4 mg total) by mouth daily. Increase to 4 mg daily       Follow-up Information    Follow up with Tivis Ringer, MD. Schedule an appointment as soon as possible for a visit in 2 weeks.   Specialty:  Internal Medicine   Why:  Follow up appt after recent hospitalization   Contact information:   [address] [place] [address] [phone]        The results of significant diagnostics from this hospitalization (including imaging, microbiology, ancillary and laboratory) are listed below for reference.    Significant Diagnostic Studies: Dg Chest 2 View  07/20/2015  CLINICAL DATA:  Hypoxia tonight. EXAM: CHEST  2 VIEW COMPARISON:   06/19/2015 FINDINGS: There is moderate cardiomegaly. There is prior sternotomy and CABG. There are intact appearances of the transvenous cardiac leads. There is stable elevation the right hemidiaphragm. There is unchanged linear scarring in the bases. The lungs are otherwise clear. The pulmonary vasculature is normal. There is no pleural effusion. Hilar and mediastinal contours are unremarkable and unchanged. IMPRESSION: Cardiomegaly.  No acute findings. Electronically Signed   By: Andreas Newport M.D.   On: 07/20/2015 02:20   Ct Head Wo Contrast  07/19/2015  CLINICAL DATA:  Fall and hit head and face on side of car. Left head and facial pain and hematoma. EXAM: CT HEAD WITHOUT CONTRAST CT MAXILLOFACIAL WITHOUT CONTRAST TECHNIQUE: Multidetector CT imaging of the head and maxillofacial structures were performed using the standard protocol without intravenous contrast. Multiplanar CT image reconstructions of the maxillofacial structures were also generated. COMPARISON:  Head CT on 06/28/2014 FINDINGS: CT HEAD FINDINGS There is no evidence of intracranial hemorrhage, brain edema, or other signs of acute infarction. There is no evidence of intracranial mass lesion or mass effect. No abnormal extraaxial fluid collections are identified. Moderate diffuse cerebral atrophy is noted. No evidence of skull fracture or pneumocephalus. A large left frontal scalp hematoma is seen. CT MAXILLOFACIAL FINDINGS Left preseptal soft tissue swelling or hematoma is seen. No evidence of orbital or facial bone fracture. The globes and other intraorbital anatomy are normal in appearance. No evidence of orbital emphysema or sinus air-fluid levels. Mucosal thickening is seen involving the ethmoid and sphenoid sinuses bilaterally. IMPRESSION: No acute intracranial abnormality.  Moderate cerebral atrophy. Left frontal scalp and periorbital/preseptal soft tissue hematoma. No evidence of skull, orbital or facial bone fracture. Electronically  Signed   By: Earle Gell M.D.   On: 07/19/2015 20:08   Ct Cervical Spine Wo Contrast  07/20/2015  CLINICAL DATA:  82 year old male with fall EXAM: CT CERVICAL SPINE WITHOUT CONTRAST TECHNIQUE: Multidetector CT imaging of the cervical spine was performed  without intravenous contrast. Multiplanar CT image reconstructions were also generated. COMPARISON:  None. FINDINGS: There is no acute fracture or subluxation of the cervical spine.There are multilevel degenerative changes with facet hypertrophy. There multilevel bilateral neural foramina stenosis most prominent at left C3-C4 and right C4-C5.The odontoid and spinous processes are intact.There is normal anatomic alignment of the C1-C2 lateral masses. The visualized soft tissues appear unremarkable. IMPRESSION: No acute/traumatic cervical pathology. Multilevel degenerative changes. Electronically Signed   By: Anner Crete M.D.   On: 07/20/2015 01:12   Ct Maxillofacial Wo Cm  07/19/2015  CLINICAL DATA:  Fall and hit head and face on side of car. Left head and facial pain and hematoma. EXAM: CT HEAD WITHOUT CONTRAST CT MAXILLOFACIAL WITHOUT CONTRAST TECHNIQUE: Multidetector CT imaging of the head and maxillofacial structures were performed using the standard protocol without intravenous contrast. Multiplanar CT image reconstructions of the maxillofacial structures were also generated. COMPARISON:  Head CT on 06/28/2014 FINDINGS: CT HEAD FINDINGS There is no evidence of intracranial hemorrhage, brain edema, or other signs of acute infarction. There is no evidence of intracranial mass lesion or mass effect. No abnormal extraaxial fluid collections are identified. Moderate diffuse cerebral atrophy is noted. No evidence of skull fracture or pneumocephalus. A large left frontal scalp hematoma is seen. CT MAXILLOFACIAL FINDINGS Left preseptal soft tissue swelling or hematoma is seen. No evidence of orbital or facial bone fracture. The globes and other intraorbital  anatomy are normal in appearance. No evidence of orbital emphysema or sinus air-fluid levels. Mucosal thickening is seen involving the ethmoid and sphenoid sinuses bilaterally. IMPRESSION: No acute intracranial abnormality.  Moderate cerebral atrophy. Left frontal scalp and periorbital/preseptal soft tissue hematoma. No evidence of skull, orbital or facial bone fracture. Electronically Signed   By: Earle Gell M.D.   On: 07/19/2015 20:08    Microbiology: No results found for this or any previous visit (from the past 240 hour(s)).   Labs: Basic Metabolic Panel:  Recent Labs Lab 07/20/15 0209 07/20/15 0611 07/21/15 0437  NA 140 139 144  K 4.3 4.3 5.0  CL 102 102 101  CO2 28 26 31   GLUCOSE 132* 144* 106*  BUN 67* 66* 70*  CREATININE 3.45* 3.42* 3.38*  CALCIUM 8.8* 8.4* 8.8*   Liver Function Tests:  Recent Labs Lab 07/20/15 0611  AST 43*  ALT 25  ALKPHOS 43  BILITOT 0.7  PROT 5.4*  ALBUMIN 2.8*   No results for input(s): LIPASE, AMYLASE in the last 168 hours. No results for input(s): AMMONIA in the last 168 hours. CBC:  Recent Labs Lab 07/18/15 1304 07/20/15 0209 07/20/15 0611 07/21/15 0437  WBC  --  6.3 4.6 4.5  NEUTROABS  --  4.9 3.7  --   HGB 10.3* 9.8* 8.4* 9.2*  HCT  --  32.4* 27.7* 31.0*  MCV  --  96.4 97.2 97.5  PLT  --  199 178 175   Cardiac Enzymes:  Recent Labs Lab 07/20/15 0611 07/20/15 1253 07/20/15 1829  TROPONINI 0.05* 0.03 0.03   BNP: BNP (last 3 results)  Recent Labs  02/20/15 0923 02/23/15 1135 07/20/15 0209  BNP 847.1* 877.8* 282.8*    ProBNP (last 3 results) No results for input(s): PROBNP in the last 8760 hours.  CBG:  Recent Labs Lab 07/20/15 1131 07/20/15 2126 07/21/15 0620  GLUCAP 123* 123* 103*

## 2015-07-21 NOTE — Progress Notes (Signed)
PT Cancellation Note  Patient Details Name: Christopher [name] MRN: [id number] DOB: [birth year]   Cancelled Treatment:    Reason Eval/Treat Not Completed: Other (comment) (Pt has been d/c).  Pt d/c before PT evaluation able to be completed.  Collie Siad PT, DPT  Pager: [phone] Phone: [phone] 07/21/2015, 11:36 AM

## 2015-07-21 NOTE — Discharge Summary (Deleted)
Physician Discharge Summary  Christopher [name] [id number] DOB: 1927/02/09 DOA: 07/19/2015  PCP: Tivis Ringer, MD  Admit date: 07/19/2015 Discharge date: 07/21/2015  Recommendations for Outpatient Follow-up:  Please avoid aspirin. Also, Coumadin is on hold prior to patient's surgery. Please do not take Coumadin until after the surgery.  Discharge Diagnoses:  Principal Problem:   Hypoxia Active Problems:   CAD (coronary artery disease)   Atrial fibrillation (HCC)   Chronic kidney disease (CKD), stage IV (severe) (HCC)   Chronic diastolic congestive heart failure, NYHA class 2 (HCC)   DM type 2 causing renal disease ([place])    Discharge Condition: stable   Diet recommendation: as tolerated   History of present illness:  82 y.o. male with medical history significant for CAD status post CABG, permanent pacemaker placement, diastolic CHF, chronic kidney disease stage IV. A shunt was scheduled to have right hip surgery next week but presented to [facility] because of fall at home. Patient was ambulating with the walker when he lost his balance and fell.   Patient was hemodynamically stable on the admission. He has left periorbital hematoma and multiple ecchymotic areas on face. CT head and neck and maxillofacial CT showed left preseptal hematoma.  While in ED, patient had an episode of hypoxia which has improved with nasal cannula oxygen support. Patient did report having a cough productive of whitish sputum over the last few days prior to this admission. He did have low grade fever in ED 100.3 F. Patient was started on empiric antibiotic for possible pneumonia. Chest x-ray did not show infiltrates.  Hospital Course:   Assessment & Plan:  Principal Problem: Mechanical fall / left frontal skull and periorbital/preseptal soft tissue hematoma - Patient is status post mechanical fall at home - CT head showed periorbital/preseptal soft tissue hematoma otherwise no acute  intracranial abnormalities - Continue to monitor her mental status, provide supportive care  - Obtain physical therapy for safe discharge planning   Active Problems: Elevated troponin level - Mild troponin elevation, 0.05. No reports of chest pain - Likely demand ischemia from fall and chronic kidney disease - The 12-lead EKG showed atrial paced rhythm  Acute respiratory failure with hypoxia / probable lobar pneumonia, unspecified organism - Chest x-ray did not show acute infiltrates but patient did have reports of productive cough and will have low-grade fever in the emergency room for which reason empiric doxycycline started - No further fever, normal WBC count so will stop doxycycline today   Chronic kidney disease stage IV - Recent creatinine baseline, 3.88 and creatinine on this admission 3.4, in baseline range   DVT prophylaxis: SCDs due to risk of bleeding Code Status: full code  Family Communication: Wife at the bedside this morning   Consultants:   None  Procedures:   None  Antimicrobials:   Doxycycline 07/20/2015 --> 07/21/2015   Signed:  Leisa Lenz, MD  [facility] 07/21/2015, 9:39 AM  Pager #: [phone]  Time spent in minutes: more than 30 minutes    Discharge Exam: Filed Vitals:   07/20/15 2217 07/21/15 0454  BP: 100/49 108/55  Pulse: 70 72  Temp:  98.8 F (37.1 C)  Resp:  16   Filed Vitals:   07/20/15 2128 07/20/15 2135 07/20/15 2217 07/21/15 0454  BP: 88/45 83/38 100/49 108/55  Pulse: 70  70 72  Temp: 99.4 F (37.4 C)   98.8 F (37.1 C)  TempSrc: Oral   Oral  Resp: 18   16  Height:  Weight:    102.967 kg (227 lb)  SpO2: 94%   100%    General: Pt is alert, follows commands appropriately, not in acute distress, facial hematoma appreciated but slowly improving  Cardiovascular: Regular rate and rhythm, S1/S2 +, no murmurs Respiratory: Clear to auscultation bilaterally, no wheezing, no crackles, no rhonchi Abdominal:  Soft, non tender, non distended, bowel sounds +, no guarding Extremities: no cyanosis, pulses palpable bilaterally DP and PT Neuro: Grossly nonfocal  Discharge Instructions  Discharge Instructions    Call MD for:  difficulty breathing, headache or visual disturbances    Complete by:  As directed      Call MD for:  persistant dizziness or light-headedness    Complete by:  As directed      Call MD for:  persistant nausea and vomiting    Complete by:  As directed      Call MD for:  severe uncontrolled pain    Complete by:  As directed      Diet - low sodium heart healthy    Complete by:  As directed      Discharge instructions    Complete by:  As directed   Please avoid aspirin. Also, Coumadin is on hold prior to patient's surgery. Please do not take Coumadin until after the surgery.     Increase activity slowly    Complete by:  As directed             Medication List    TAKE these medications        acetaminophen 325 MG tablet  Commonly known as:  TYLENOL  Take 2 tablets (650 mg total) by mouth every 6 (six) hours as needed for mild pain (or Fever >/= 101).     amiodarone 200 MG tablet  Commonly known as:  PACERONE  Take 1 tablet (200 mg total) by mouth every morning.     atenolol 25 MG tablet  Commonly known as:  TENORMIN  Take 25 mg by mouth every morning.     CALTRATE 600+D3 SOFT PO  Take 1 tablet by mouth 2 (two) times daily.     Darbepoetin Alfa 100 MCG/0.5ML Sosy injection  Commonly known as:  ARANESP  Inject 100 mcg into the skin every 21 ( twenty-one) days.     dextromethorphan-guaiFENesin 30-600 MG 12hr tablet  Commonly known as:  MUCINEX DM  Take 1 tablet by mouth 2 (two) times daily as needed for cough.     Fish Oil 1200 MG Caps  Take 1,200 mg by mouth daily.     folic acid A999333 MCG tablet  Commonly known as:  FOLVITE  Take 400 mcg by mouth daily.     furosemide 80 MG tablet  Commonly known as:  LASIX  Take 1 tablet (80 mg total) by mouth 2 (two)  times daily.     glimepiride 1 MG tablet  Commonly known as:  AMARYL  Take 1 tablet (1 mg total) by mouth daily before breakfast.     Glucosamine HCl 1500 MG Tabs  Take 1,500 mg by mouth 2 (two) times daily.     Horse Chestnut 300 MG Caps  Take 300 mg by mouth daily.     MIRALAX PO  Take 17 g by mouth daily.     multivitamin with minerals Tabs tablet  Take 1 tablet by mouth daily.     sevelamer carbonate 800 MG tablet  Commonly known as:  RENVELA  Take 2 tablets (1,600 mg total)  by mouth 3 (three) times daily with meals.     tamsulosin 0.4 MG Caps capsule  Commonly known as:  FLOMAX  Take 0.4 mg by mouth daily.     warfarin 4 MG tablet  Commonly known as:  COUMADIN  Take 0.5-1 tablets (2-4 mg total) by mouth daily. Increase to 4 mg daily           Follow-up Information    Follow up with Tivis Ringer, MD. Schedule an appointment as soon as possible for a visit in 2 weeks.   Specialty:  Internal Medicine   Why:  Follow up appt after recent hospitalization   Contact information:   [place] [address] [phone]        The results of significant diagnostics from this hospitalization (including imaging, microbiology, ancillary and laboratory) are listed below for reference.    Significant Diagnostic Studies: Dg Chest 2 View  07/20/2015  CLINICAL DATA:  Hypoxia tonight. EXAM: CHEST  2 VIEW COMPARISON:  06/19/2015 FINDINGS: There is moderate cardiomegaly. There is prior sternotomy and CABG. There are intact appearances of the transvenous cardiac leads. There is stable elevation the right hemidiaphragm. There is unchanged linear scarring in the bases. The lungs are otherwise clear. The pulmonary vasculature is normal. There is no pleural effusion. Hilar and mediastinal contours are unremarkable and unchanged. IMPRESSION: Cardiomegaly.  No acute findings. Electronically Signed   By: Andreas Newport M.D.   On: 07/20/2015 02:20   Ct Head Wo  Contrast  07/19/2015  CLINICAL DATA:  Fall and hit head and face on side of car. Left head and facial pain and hematoma. EXAM: CT HEAD WITHOUT CONTRAST CT MAXILLOFACIAL WITHOUT CONTRAST TECHNIQUE: Multidetector CT imaging of the head and maxillofacial structures were performed using the standard protocol without intravenous contrast. Multiplanar CT image reconstructions of the maxillofacial structures were also generated. COMPARISON:  Head CT on 06/28/2014 FINDINGS: CT HEAD FINDINGS There is no evidence of intracranial hemorrhage, brain edema, or other signs of acute infarction. There is no evidence of intracranial mass lesion or mass effect. No abnormal extraaxial fluid collections are identified. Moderate diffuse cerebral atrophy is noted. No evidence of skull fracture or pneumocephalus. A large left frontal scalp hematoma is seen. CT MAXILLOFACIAL FINDINGS Left preseptal soft tissue swelling or hematoma is seen. No evidence of orbital or facial bone fracture. The globes and other intraorbital anatomy are normal in appearance. No evidence of orbital emphysema or sinus air-fluid levels. Mucosal thickening is seen involving the ethmoid and sphenoid sinuses bilaterally. IMPRESSION: No acute intracranial abnormality.  Moderate cerebral atrophy. Left frontal scalp and periorbital/preseptal soft tissue hematoma. No evidence of skull, orbital or facial bone fracture. Electronically Signed   By: Earle Gell M.D.   On: 07/19/2015 20:08   Ct Cervical Spine Wo Contrast  07/20/2015  CLINICAL DATA:  82 year old male with fall EXAM: CT CERVICAL SPINE WITHOUT CONTRAST TECHNIQUE: Multidetector CT imaging of the cervical spine was performed without intravenous contrast. Multiplanar CT image reconstructions were also generated. COMPARISON:  None. FINDINGS: There is no acute fracture or subluxation of the cervical spine.There are multilevel degenerative changes with facet hypertrophy. There multilevel bilateral neural foramina  stenosis most prominent at left C3-C4 and right C4-C5.The odontoid and spinous processes are intact.There is normal anatomic alignment of the C1-C2 lateral masses. The visualized soft tissues appear unremarkable. IMPRESSION: No acute/traumatic cervical pathology. Multilevel degenerative changes. Electronically Signed   By: Anner Crete M.D.   On: 07/20/2015 01:12  Ct Maxillofacial Wo Cm  07/19/2015  CLINICAL DATA:  Fall and hit head and face on side of car. Left head and facial pain and hematoma. EXAM: CT HEAD WITHOUT CONTRAST CT MAXILLOFACIAL WITHOUT CONTRAST TECHNIQUE: Multidetector CT imaging of the head and maxillofacial structures were performed using the standard protocol without intravenous contrast. Multiplanar CT image reconstructions of the maxillofacial structures were also generated. COMPARISON:  Head CT on 06/28/2014 FINDINGS: CT HEAD FINDINGS There is no evidence of intracranial hemorrhage, brain edema, or other signs of acute infarction. There is no evidence of intracranial mass lesion or mass effect. No abnormal extraaxial fluid collections are identified. Moderate diffuse cerebral atrophy is noted. No evidence of skull fracture or pneumocephalus. A large left frontal scalp hematoma is seen. CT MAXILLOFACIAL FINDINGS Left preseptal soft tissue swelling or hematoma is seen. No evidence of orbital or facial bone fracture. The globes and other intraorbital anatomy are normal in appearance. No evidence of orbital emphysema or sinus air-fluid levels. Mucosal thickening is seen involving the ethmoid and sphenoid sinuses bilaterally. IMPRESSION: No acute intracranial abnormality.  Moderate cerebral atrophy. Left frontal scalp and periorbital/preseptal soft tissue hematoma. No evidence of skull, orbital or facial bone fracture. Electronically Signed   By: Earle Gell M.D.   On: 07/19/2015 20:08    Microbiology: No results found for this or any previous visit (from the past 240 hour(s)).    Labs: Basic Metabolic Panel:  Recent Labs Lab 07/20/15 0209 07/20/15 0611 07/21/15 0437  NA 140 139 144  K 4.3 4.3 5.0  CL 102 102 101  CO2 28 26 31   GLUCOSE 132* 144* 106*  BUN 67* 66* 70*  CREATININE 3.45* 3.42* 3.38*  CALCIUM 8.8* 8.4* 8.8*   Liver Function Tests:  Recent Labs Lab 07/20/15 0611  AST 43*  ALT 25  ALKPHOS 43  BILITOT 0.7  PROT 5.4*  ALBUMIN 2.8*   No results for input(s): LIPASE, AMYLASE in the last 168 hours. No results for input(s): AMMONIA in the last 168 hours. CBC:  Recent Labs Lab 07/18/15 1304 07/20/15 0209 07/20/15 0611 07/21/15 0437  WBC  --  6.3 4.6 4.5  NEUTROABS  --  4.9 3.7  --   HGB 10.3* 9.8* 8.4* 9.2*  HCT  --  32.4* 27.7* 31.0*  MCV  --  96.4 97.2 97.5  PLT  --  199 178 175   Cardiac Enzymes:  Recent Labs Lab 07/20/15 0611 07/20/15 1253 07/20/15 1829  TROPONINI 0.05* 0.03 0.03   BNP: BNP (last 3 results)  Recent Labs  02/20/15 0923 02/23/15 1135 07/20/15 0209  BNP 847.1* 877.8* 282.8*    ProBNP (last 3 results) No results for input(s): PROBNP in the last 8760 hours.  CBG:  Recent Labs Lab 07/20/15 1131 07/20/15 2126 07/21/15 0620  GLUCAP 123* 123* 103*

## 2015-07-26 ENCOUNTER — Inpatient Hospital Stay (HOSPITAL_COMMUNITY): Admission: RE | Admit: 2015-07-26 | Payer: Medicare Other | Source: Ambulatory Visit | Admitting: Orthopedic Surgery

## 2015-07-26 ENCOUNTER — Encounter (HOSPITAL_COMMUNITY): Admission: RE | Payer: Self-pay | Source: Ambulatory Visit

## 2015-07-26 SURGERY — CONVERSION, PREVIOUS HIP SURGERY, TO TOTAL HIP ARTHROPLASTY
Anesthesia: Choice | Laterality: Right

## 2015-08-03 DIAGNOSIS — N184 Chronic kidney disease, stage 4 (severe): Secondary | ICD-10-CM | POA: Diagnosis not present

## 2015-08-03 DIAGNOSIS — I509 Heart failure, unspecified: Secondary | ICD-10-CM | POA: Diagnosis not present

## 2015-08-08 ENCOUNTER — Ambulatory Visit (HOSPITAL_COMMUNITY)
Admission: RE | Admit: 2015-08-08 | Discharge: 2015-08-08 | Disposition: A | Discharge status: Home / Self Care | Payer: Medicare Other | Source: Ambulatory Visit | Attending: Nephrology | Admitting: Nephrology

## 2015-08-08 DIAGNOSIS — Z5181 Encounter for therapeutic drug level monitoring: Secondary | ICD-10-CM | POA: Diagnosis not present

## 2015-08-08 DIAGNOSIS — N184 Chronic kidney disease, stage 4 (severe): Secondary | ICD-10-CM | POA: Insufficient documentation

## 2015-08-08 DIAGNOSIS — Z79899 Other long term (current) drug therapy: Secondary | ICD-10-CM | POA: Insufficient documentation

## 2015-08-08 DIAGNOSIS — D631 Anemia in chronic kidney disease: Secondary | ICD-10-CM | POA: Insufficient documentation

## 2015-08-08 LAB — IRON AND TIBC
Iron: 95 ug/dL (ref 45–182)
SATURATION RATIOS: 33 % (ref 17.9–39.5)
TIBC: 291 ug/dL (ref 250–450)
UIBC: 196 ug/dL

## 2015-08-08 LAB — POCT HEMOGLOBIN-HEMACUE: Hemoglobin: 10.2 g/dL — ABNORMAL LOW (ref 13.0–17.0)

## 2015-08-08 LAB — FERRITIN: Ferritin: 761 ng/mL — ABNORMAL HIGH (ref 24–336)

## 2015-08-08 MED ORDER — DARBEPOETIN ALFA 100 MCG/0.5ML IJ SOSY
100.0000 ug | PREFILLED_SYRINGE | INTRAMUSCULAR | Status: DC
  Administered 2015-08-08: 100 ug via SUBCUTANEOUS

## 2015-08-08 MED ORDER — DARBEPOETIN ALFA 100 MCG/0.5ML IJ SOSY
PREFILLED_SYRINGE | INTRAMUSCULAR | Status: AC
  Filled 2015-08-08: qty 0.5

## 2015-08-16 DIAGNOSIS — Z7901 Long term (current) use of anticoagulants: Secondary | ICD-10-CM | POA: Diagnosis not present

## 2015-08-16 DIAGNOSIS — I5032 Chronic diastolic (congestive) heart failure: Secondary | ICD-10-CM | POA: Diagnosis not present

## 2015-08-16 DIAGNOSIS — I119 Hypertensive heart disease without heart failure: Secondary | ICD-10-CM | POA: Diagnosis not present

## 2015-08-16 DIAGNOSIS — I2510 Atherosclerotic heart disease of native coronary artery without angina pectoris: Secondary | ICD-10-CM | POA: Diagnosis not present

## 2015-08-16 DIAGNOSIS — R0602 Shortness of breath: Secondary | ICD-10-CM | POA: Diagnosis not present

## 2015-08-16 DIAGNOSIS — E785 Hyperlipidemia, unspecified: Secondary | ICD-10-CM | POA: Diagnosis not present

## 2015-08-16 DIAGNOSIS — E1122 Type 2 diabetes mellitus with diabetic chronic kidney disease: Secondary | ICD-10-CM | POA: Diagnosis not present

## 2015-08-16 DIAGNOSIS — N184 Chronic kidney disease, stage 4 (severe): Secondary | ICD-10-CM | POA: Diagnosis not present

## 2015-08-16 DIAGNOSIS — Z951 Presence of aortocoronary bypass graft: Secondary | ICD-10-CM | POA: Diagnosis not present

## 2015-08-16 DIAGNOSIS — Z950 Presence of cardiac pacemaker: Secondary | ICD-10-CM | POA: Diagnosis not present

## 2015-08-16 DIAGNOSIS — I480 Paroxysmal atrial fibrillation: Secondary | ICD-10-CM | POA: Diagnosis not present

## 2015-08-19 DIAGNOSIS — H05239 Hemorrhage of unspecified orbit: Secondary | ICD-10-CM | POA: Diagnosis not present

## 2015-08-19 DIAGNOSIS — J189 Pneumonia, unspecified organism: Secondary | ICD-10-CM | POA: Diagnosis not present

## 2015-08-19 DIAGNOSIS — N184 Chronic kidney disease, stage 4 (severe): Secondary | ICD-10-CM | POA: Diagnosis not present

## 2015-08-19 DIAGNOSIS — S72009A Fracture of unspecified part of neck of unspecified femur, initial encounter for closed fracture: Secondary | ICD-10-CM | POA: Diagnosis not present

## 2015-08-19 DIAGNOSIS — E1122 Type 2 diabetes mellitus with diabetic chronic kidney disease: Secondary | ICD-10-CM | POA: Diagnosis not present

## 2015-08-19 DIAGNOSIS — I509 Heart failure, unspecified: Secondary | ICD-10-CM | POA: Diagnosis not present

## 2015-08-22 DIAGNOSIS — E1129 Type 2 diabetes mellitus with other diabetic kidney complication: Secondary | ICD-10-CM | POA: Diagnosis not present

## 2015-08-22 DIAGNOSIS — N184 Chronic kidney disease, stage 4 (severe): Secondary | ICD-10-CM | POA: Diagnosis not present

## 2015-08-22 DIAGNOSIS — D631 Anemia in chronic kidney disease: Secondary | ICD-10-CM | POA: Diagnosis not present

## 2015-08-22 DIAGNOSIS — I129 Hypertensive chronic kidney disease with stage 1 through stage 4 chronic kidney disease, or unspecified chronic kidney disease: Secondary | ICD-10-CM | POA: Diagnosis not present

## 2015-08-29 ENCOUNTER — Other Ambulatory Visit (HOSPITAL_COMMUNITY): Payer: Self-pay | Admitting: *Deleted

## 2015-08-29 ENCOUNTER — Encounter (HOSPITAL_COMMUNITY): Payer: Medicare Other

## 2015-08-30 ENCOUNTER — Ambulatory Visit (HOSPITAL_COMMUNITY)
Admission: RE | Admit: 2015-08-30 | Discharge: 2015-08-30 | Disposition: A | Discharge status: Home / Self Care | Payer: Medicare Other | Source: Ambulatory Visit | Attending: Nephrology | Admitting: Nephrology

## 2015-08-30 DIAGNOSIS — N184 Chronic kidney disease, stage 4 (severe): Secondary | ICD-10-CM | POA: Diagnosis not present

## 2015-08-30 DIAGNOSIS — I5032 Chronic diastolic (congestive) heart failure: Secondary | ICD-10-CM | POA: Diagnosis not present

## 2015-08-30 DIAGNOSIS — Z79899 Other long term (current) drug therapy: Secondary | ICD-10-CM | POA: Diagnosis not present

## 2015-08-30 DIAGNOSIS — E785 Hyperlipidemia, unspecified: Secondary | ICD-10-CM | POA: Diagnosis not present

## 2015-08-30 DIAGNOSIS — Z5181 Encounter for therapeutic drug level monitoring: Secondary | ICD-10-CM | POA: Diagnosis not present

## 2015-08-30 DIAGNOSIS — I2510 Atherosclerotic heart disease of native coronary artery without angina pectoris: Secondary | ICD-10-CM | POA: Diagnosis not present

## 2015-08-30 DIAGNOSIS — Z950 Presence of cardiac pacemaker: Secondary | ICD-10-CM | POA: Diagnosis not present

## 2015-08-30 DIAGNOSIS — Z951 Presence of aortocoronary bypass graft: Secondary | ICD-10-CM | POA: Diagnosis not present

## 2015-08-30 DIAGNOSIS — D631 Anemia in chronic kidney disease: Secondary | ICD-10-CM | POA: Diagnosis not present

## 2015-08-30 DIAGNOSIS — I480 Paroxysmal atrial fibrillation: Secondary | ICD-10-CM | POA: Diagnosis not present

## 2015-08-30 DIAGNOSIS — E1122 Type 2 diabetes mellitus with diabetic chronic kidney disease: Secondary | ICD-10-CM | POA: Diagnosis not present

## 2015-08-30 DIAGNOSIS — Z7901 Long term (current) use of anticoagulants: Secondary | ICD-10-CM | POA: Diagnosis not present

## 2015-08-30 DIAGNOSIS — I119 Hypertensive heart disease without heart failure: Secondary | ICD-10-CM | POA: Diagnosis not present

## 2015-08-30 DIAGNOSIS — R0602 Shortness of breath: Secondary | ICD-10-CM | POA: Diagnosis not present

## 2015-08-30 LAB — POCT HEMOGLOBIN-HEMACUE: Hemoglobin: 10.2 g/dL — ABNORMAL LOW (ref 13.0–17.0)

## 2015-08-30 MED ORDER — DARBEPOETIN ALFA 100 MCG/0.5ML IJ SOSY
100.0000 ug | PREFILLED_SYRINGE | INTRAMUSCULAR | Status: DC
  Administered 2015-08-30: 100 ug via SUBCUTANEOUS

## 2015-08-30 MED ORDER — DARBEPOETIN ALFA 100 MCG/0.5ML IJ SOSY
PREFILLED_SYRINGE | INTRAMUSCULAR | Status: AC
  Administered 2015-08-30: 100 ug via SUBCUTANEOUS
  Filled 2015-08-30: qty 0.5

## 2015-09-01 ENCOUNTER — Encounter: Payer: Self-pay | Admitting: Surgery

## 2015-09-06 ENCOUNTER — Other Ambulatory Visit: Payer: Self-pay

## 2015-09-06 DIAGNOSIS — N184 Chronic kidney disease, stage 4 (severe): Secondary | ICD-10-CM | POA: Diagnosis not present

## 2015-09-06 DIAGNOSIS — S72009A Fracture of unspecified part of neck of unspecified femur, initial encounter for closed fracture: Secondary | ICD-10-CM | POA: Diagnosis not present

## 2015-09-06 DIAGNOSIS — I509 Heart failure, unspecified: Secondary | ICD-10-CM | POA: Diagnosis not present

## 2015-09-06 DIAGNOSIS — Z01810 Encounter for preprocedural cardiovascular examination: Secondary | ICD-10-CM

## 2015-09-06 DIAGNOSIS — E1122 Type 2 diabetes mellitus with diabetic chronic kidney disease: Secondary | ICD-10-CM | POA: Diagnosis not present

## 2015-09-06 DIAGNOSIS — Z6832 Body mass index (BMI) 32.0-32.9, adult: Secondary | ICD-10-CM | POA: Diagnosis not present

## 2015-09-06 DIAGNOSIS — R5383 Other fatigue: Secondary | ICD-10-CM | POA: Diagnosis not present

## 2015-09-08 ENCOUNTER — Ambulatory Visit (HOSPITAL_COMMUNITY)
Admission: RE | Admit: 2015-09-08 | Discharge: 2015-09-08 | Disposition: A | Discharge status: Home / Self Care | Payer: Medicare Other | Source: Ambulatory Visit | Attending: Surgery | Admitting: Surgery

## 2015-09-08 ENCOUNTER — Other Ambulatory Visit: Payer: Self-pay

## 2015-09-08 ENCOUNTER — Ambulatory Visit (INDEPENDENT_AMBULATORY_CARE_PROVIDER_SITE_OTHER): Payer: Medicare Other | Admitting: Surgery

## 2015-09-08 ENCOUNTER — Ambulatory Visit (INDEPENDENT_AMBULATORY_CARE_PROVIDER_SITE_OTHER)
Admission: RE | Admit: 2015-09-08 | Discharge: 2015-09-08 | Disposition: A | Discharge status: Home / Self Care | Payer: Medicare Other | Source: Ambulatory Visit | Attending: Surgery | Admitting: Surgery

## 2015-09-08 ENCOUNTER — Encounter: Payer: Self-pay | Admitting: Surgery

## 2015-09-08 VITALS — BP 113/70 | HR 71 | Temp 96.9°F | Resp 18 | Ht 73.0 in | Wt 234.0 lb

## 2015-09-08 DIAGNOSIS — Z01810 Encounter for preprocedural cardiovascular examination: Secondary | ICD-10-CM

## 2015-09-08 DIAGNOSIS — I1310 Hypertensive heart and chronic kidney disease without heart failure, with stage 1 through stage 4 chronic kidney disease, or unspecified chronic kidney disease: Secondary | ICD-10-CM | POA: Insufficient documentation

## 2015-09-08 DIAGNOSIS — N184 Chronic kidney disease, stage 4 (severe): Secondary | ICD-10-CM

## 2015-09-08 DIAGNOSIS — I2510 Atherosclerotic heart disease of native coronary artery without angina pectoris: Secondary | ICD-10-CM | POA: Insufficient documentation

## 2015-09-08 NOTE — Progress Notes (Signed)
[facility]  Patient name: Christopher [name] MRN: [id number] DOB: 1927/03/09 Sex: male  REFERRING PHYSICIAN: Dr. Mercy Moore  REASON FOR CONSULT: dialysis acces  HPI: Christopher [name] is a 82 y.o. male, who is her today for evaluation of dialysis access.  He has stage IV renal insufficiency.  This is secondary to diabetes and hypertension as well as advanced age.  He is right-handed.  He is scheduled to have hip replacement surgery in August and the concern is that his surgery exacerbates his renal disease and therefore access placement has been requested.  He does take Coumadin for atrial fibrillation.  He has a dual-chamber pacemaker.  His heart disease is followed by Dr. Wynonia Lawman.  He is status post CABG in 2003  Past Medical History  Diagnosis Date  . Lumbar disc disease   . BPH (benign prostatic hyperplasia)   . HTN (hypertension)   . Sinus node dysfunction (HCC)     symptomatic bradycardia  . Pacemaker     Medtronic-dual-chamber-DOI 2012  . CAD (coronary artery disease)     followed by Dr. Wynonia Lawman  . History of skin cancer   . Chronic kidney disease     kidneys function at "20 %" followed by Dr. Mercy Moore  . Prostate cancer (Christopher [name])   . Lumbar disc disease 1992  . Atrial fibrillation (Christopher [name]) 08/07/2011    CHADS score  2 CHADS2 VASC score 4  Onset winter of 2013 Cardioversion May 2013 Reversion to a fib 3/14 amiodarone begun 07/02/12  Repeat cardioversion Jul 16, 2012    . CAD (coronary artery disease) 03/06/2010    CABG with LIMA to LAD, SVG to OM-OM2  05/05/01 Dr. Roxan Hockey  Cardiac Cath 12/2009 60% left main, occluded LAD, Widely patent SVG to OM1 and 2, patent LIMA, widely patent RCA    . Cardiac pacemaker in situ     07/26/10  Inserted for chronotropic incompetence and dyspnea  RA lead  Medtronic 5076  serial number [id number]   RV lead  Medtronic 5076  serial number [id number] Medtronic Adapta RL pulse generator, SN  [id number]     . Hypertensive heart disease without CHF     Family History  Problem Relation Age of Onset  . Cancer Mother     breast  . Cancer Father     prostate, esophagus    SOCIAL HISTORY: Social History   Social History  . Marital Status: Married    Spouse Name: N/A  . Number of Children: 3  . Years of Education: N/A   Occupational History  . retired    Social History Main Topics  . Smoking status: Former Smoker -- 0.75 packs/day for 24 years    Types: Cigarettes    Quit date: 03/12/1968  . Smokeless tobacco: Former Systems developer    Types: Snuff, Chew    Quit date: 03/11/1968  . Alcohol Use: No  . Drug Use: No  . Sexual Activity: Not Currently   Other Topics Concern  . Not on file   Social History Narrative    Allergies  Allergen Reactions  . Crestor [Rosuvastatin] Other (See Comments)    Aches in joint   . Lipitor [Atorvastatin] Other (See Comments)    Joint aching  . Penicillins Rash and Other (See Comments)    Has patient had a PCN reaction causing immediate rash, facial/tongue/throat swelling, SOB or lightheadedness with hypotension: yes Has patient had a PCN reaction causing severe rash involving mucus membranes or skin necrosis:  no Has patient had a PCN reaction that required hospitalization no Has patient had a PCN reaction occurring within the last 10 years: no If all of the above answers are "NO", then may proceed with Cephalosporin use.   . Tramadol Nausea Only  . Vicodin [Hydrocodone-Acetaminophen] Anxiety    "Made me nervous"    Current Outpatient Prescriptions  Medication Sig Dispense Refill  . acetaminophen (TYLENOL) 325 MG tablet Take 2 tablets (650 mg total) by mouth every 6 (six) hours as needed for mild pain (or Fever >/= 101). 30 tablet 0  . amiodarone (PACERONE) 200 MG tablet Take 1 tablet (200 mg total) by mouth every morning.    Marland Kitchen atenolol (TENORMIN) 25 MG tablet Take 25 mg by mouth every morning.     . Calcium Carb-Cholecalciferol (CALTRATE  600+D3 SOFT PO) Take 1 tablet by mouth 2 (two) times daily.     . Darbepoetin Alfa (ARANESP) 100 MCG/0.5ML SOSY injection Inject 100 mcg into the skin every 21 ( twenty-one) days.    Marland Kitchen dextromethorphan-guaiFENesin (MUCINEX DM) 30-600 MG 12hr tablet Take 1 tablet by mouth 2 (two) times daily as needed for cough. 45 tablet 0  . folic acid (FOLVITE) A999333 MCG tablet Take 400 mcg by mouth daily.      . furosemide (LASIX) 80 MG tablet Take 1 tablet (80 mg total) by mouth 2 (two) times daily. (Patient taking differently: Take 80-160 mg by mouth 2 (two) times daily. Takes two tablets in the morning and one tablet at dinner.)    . glimepiride (AMARYL) 1 MG tablet Take 1 tablet (1 mg total) by mouth daily before breakfast. 30 tablet 1  . Glucosamine HCl 1500 MG TABS Take 1,500 mg by mouth 2 (two) times daily.     . Horse Chestnut 300 MG CAPS Take 300 mg by mouth daily.     . Multiple Vitamin (MULTIVITAMIN WITH MINERALS) TABS tablet Take 1 tablet by mouth daily.    . Omega-3 Fatty Acids (FISH OIL) 1200 MG CAPS Take 1,200 mg by mouth daily.    . Polyethylene Glycol 3350 (MIRALAX PO) Take 17 g by mouth daily.     . sevelamer carbonate (RENVELA) 800 MG tablet Take 2 tablets (1,600 mg total) by mouth 3 (three) times daily with meals. (Patient taking differently: Take 800 mg by mouth 3 (three) times daily with meals. ) 180 tablet 12  . tamsulosin (FLOMAX) 0.4 MG CAPS capsule Take 0.4 mg by mouth daily.   3  . warfarin (COUMADIN) 4 MG tablet Take 0.5-1 tablets (2-4 mg total) by mouth daily. Increase to 4 mg daily (Patient taking differently: Take 4 mg by mouth daily. )     No current facility-administered medications for this visit.    REVIEW OF SYSTEMS:  [X]  denotes positive finding, [ ]  denotes negative finding Cardiac  Comments:  Chest pain or chest pressure:    Shortness of breath upon exertion: x   Short of breath when lying flat: x   Irregular heart rhythm: x       Vascular    Pain in calf, thigh, or  hip brought on by ambulation:    Pain in feet at night that wakes you up from your sleep:     Blood clot in your veins:    Leg swelling:  x       Pulmonary    Oxygen at home:    Productive cough:     Wheezing:  x  Neurologic    Sudden weakness in arms or legs:     Sudden numbness in arms or legs:     Sudden onset of difficulty speaking or slurred speech:    Temporary loss of vision in one eye:     Problems with dizziness:         Gastrointestinal    Blood in stool:     Vomited blood:         Genitourinary    Burning when urinating:     Blood in urine:        Psychiatric    Major depression:         Hematologic    Bleeding problems:    Problems with blood clotting too easily:        Skin    Rashes or ulcers:        Constitutional    Fever or chills:      PHYSICAL EXAM: Filed Vitals:   09/08/15 0926  BP: 113/70  Pulse: 71  Temp: 96.9 F (36.1 C)  Resp: 18  Height: 6\' 1"  (1.854 m)  Weight: 234 lb (106.142 kg)  SpO2: 96%    GENERAL: The patient is a well-nourished male, in no acute distress. The vital signs are documented above. CARDIAC: There is a regular rate and rhythm.  VASCULAR: Palpable left radial and brachial pulse PULMONARY: There is good air exchange bilaterally without wheezing or rales. MUSCULOSKELETAL: There are no major deformities or cyanosis. NEUROLOGIC: No focal weakness or paresthesias are detected. SKIN: There are no ulcers or rashes noted. PSYCHIATRIC: The patient has a normal affect.  DATA:  I have reviewed his vascular lab studies. Arterial: Allen's test was decreased with ulnar compression on the left. Venous: Vein mapping shows an adequate cephalic vein on the left  MEDICAL ISSUES: I discussed proceeding with a left arm fistula.  I suspect this will end up being a brachiocephalic fistula but I would evaluate the cephalic vein in the operating room to determine if he would be a candidate for radiocephalic fistula.  I discussed  the risks and benefits of the procedure with the patient and his wife, including but not limited to the risk of premature occlusion, the need for future interventions, and the risk of steal syndrome.  He will need to be off of his Coumadin for 5 days prior to his operation.  This needs to be done in advance of his hip replacement surgery, therefore it will need to be scheduled within the next few weeks.  We are looking for a date to get this accommodated.  He understands that one of my partners may end up doing his surgery in order to accommodate the timeframe.   Annamarie Major, MD [facility] [phone] Pager [phone]

## 2015-09-11 DIAGNOSIS — I10 Essential (primary) hypertension: Secondary | ICD-10-CM | POA: Diagnosis not present

## 2015-09-11 DIAGNOSIS — I509 Heart failure, unspecified: Secondary | ICD-10-CM | POA: Diagnosis not present

## 2015-09-11 DIAGNOSIS — N184 Chronic kidney disease, stage 4 (severe): Secondary | ICD-10-CM | POA: Diagnosis not present

## 2015-09-11 DIAGNOSIS — J984 Other disorders of lung: Secondary | ICD-10-CM | POA: Diagnosis not present

## 2015-09-13 ENCOUNTER — Encounter: Payer: Self-pay | Admitting: Surgery

## 2015-09-13 ENCOUNTER — Encounter (HOSPITAL_COMMUNITY)
Admission: RE | Admit: 2015-09-13 | Discharge: 2015-09-13 | Disposition: A | Discharge status: Home / Self Care | Payer: Medicare Other | Source: Ambulatory Visit | Attending: Vascular Surgery | Admitting: Vascular Surgery

## 2015-09-13 ENCOUNTER — Encounter (HOSPITAL_COMMUNITY): Payer: Self-pay

## 2015-09-13 DIAGNOSIS — Z87891 Personal history of nicotine dependence: Secondary | ICD-10-CM | POA: Insufficient documentation

## 2015-09-13 DIAGNOSIS — Z01818 Encounter for other preprocedural examination: Secondary | ICD-10-CM | POA: Insufficient documentation

## 2015-09-13 DIAGNOSIS — I2510 Atherosclerotic heart disease of native coronary artery without angina pectoris: Secondary | ICD-10-CM | POA: Diagnosis not present

## 2015-09-13 DIAGNOSIS — I480 Paroxysmal atrial fibrillation: Secondary | ICD-10-CM | POA: Diagnosis not present

## 2015-09-13 DIAGNOSIS — I119 Hypertensive heart disease without heart failure: Secondary | ICD-10-CM | POA: Diagnosis not present

## 2015-09-13 DIAGNOSIS — Z8546 Personal history of malignant neoplasm of prostate: Secondary | ICD-10-CM | POA: Insufficient documentation

## 2015-09-13 DIAGNOSIS — Z79899 Other long term (current) drug therapy: Secondary | ICD-10-CM | POA: Diagnosis not present

## 2015-09-13 DIAGNOSIS — I130 Hypertensive heart and chronic kidney disease with heart failure and stage 1 through stage 4 chronic kidney disease, or unspecified chronic kidney disease: Secondary | ICD-10-CM | POA: Diagnosis not present

## 2015-09-13 DIAGNOSIS — E785 Hyperlipidemia, unspecified: Secondary | ICD-10-CM | POA: Diagnosis not present

## 2015-09-13 DIAGNOSIS — R0602 Shortness of breath: Secondary | ICD-10-CM | POA: Diagnosis not present

## 2015-09-13 DIAGNOSIS — Z7984 Long term (current) use of oral hypoglycemic drugs: Secondary | ICD-10-CM | POA: Diagnosis not present

## 2015-09-13 DIAGNOSIS — I509 Heart failure, unspecified: Secondary | ICD-10-CM | POA: Insufficient documentation

## 2015-09-13 DIAGNOSIS — Z7901 Long term (current) use of anticoagulants: Secondary | ICD-10-CM | POA: Insufficient documentation

## 2015-09-13 DIAGNOSIS — I4891 Unspecified atrial fibrillation: Secondary | ICD-10-CM | POA: Diagnosis not present

## 2015-09-13 DIAGNOSIS — Z950 Presence of cardiac pacemaker: Secondary | ICD-10-CM | POA: Diagnosis not present

## 2015-09-13 DIAGNOSIS — N189 Chronic kidney disease, unspecified: Secondary | ICD-10-CM | POA: Insufficient documentation

## 2015-09-13 DIAGNOSIS — Z951 Presence of aortocoronary bypass graft: Secondary | ICD-10-CM | POA: Insufficient documentation

## 2015-09-13 DIAGNOSIS — I5032 Chronic diastolic (congestive) heart failure: Secondary | ICD-10-CM | POA: Diagnosis not present

## 2015-09-13 DIAGNOSIS — E1122 Type 2 diabetes mellitus with diabetic chronic kidney disease: Secondary | ICD-10-CM | POA: Insufficient documentation

## 2015-09-13 DIAGNOSIS — N184 Chronic kidney disease, stage 4 (severe): Secondary | ICD-10-CM | POA: Diagnosis not present

## 2015-09-13 HISTORY — DX: Type 2 diabetes mellitus without complications: E11.9

## 2015-09-13 LAB — GLUCOSE, CAPILLARY: Glucose-Capillary: 140 mg/dL — ABNORMAL HIGH (ref 65–99)

## 2015-09-13 NOTE — Pre-Procedure Instructions (Signed)
AIIDEN DARGA  09/13/2015      [id number] - [place], [place] - [address] AT [facility] [address] [place] [address] Phone: [phone] Fax: [phone]    Your procedure is scheduled on July 10  Report to [place] at 830 A.M.  Call this number if you have problems the morning of surgery:  [phone]   Remember:  Do not eat food or drink liquids after midnight.  Take these medicines the morning of surgery with A SIP OF WATER amiodarone (Pacerone), atenolol (Tenormin), Hydrocodone (Norco) if needed, Sevalamer carbonate (Renvela)  Stop taking aspirin, Ibuprofen, Advil, Motrin, Aleve, Fish Oil, Herbal medications, BC's, Goody's, Glucosamine, Horse Chestnut, vitamins Stop coumadin as directed by your Dr.   Francene Finders to Manage Your Diabetes Before and After Surgery  Why is it important to control my blood sugar before and after surgery? . Improving blood sugar levels before and after surgery helps healing and can limit problems. . A way of improving blood sugar control is eating a healthy diet by: o  Eating less sugar and carbohydrates o  Increasing activity/exercise o  Talking with your doctor about reaching your blood sugar goals . High blood sugars (greater than 180 mg/dL) can raise your risk of infections and slow your recovery, so you will need to focus on controlling your diabetes during the weeks before surgery. . Make sure that the doctor who takes care of your diabetes knows about your planned surgery including the date and location.  How do I manage my blood sugar before surgery? . Check your blood sugar at least 4 times a day, starting 2 days before surgery, to make sure that the level is not too high or low. o Check your blood sugar the morning of your surgery when you wake up and every 2 hours until you get to the Short Stay unit. . If your blood sugar is less than 70 mg/dL, you will need to treat for  low blood sugar: o Do not take insulin. o Treat a low blood sugar (less than 70 mg/dL) with  cup of clear juice (cranberry or apple), 4 glucose tablets, OR glucose gel. o Recheck blood sugar in 15 minutes after treatment (to make sure it is greater than 70 mg/dL). If your blood sugar is not greater than 70 mg/dL on recheck, call [phone] for further instructions. . Report your blood sugar to the short stay nurse when you get to Short Stay.  . If you are admitted to the hospital after surgery: o Your blood sugar will be checked by the staff and you will probably be given insulin after surgery (instead of oral diabetes medicines) to make sure you have good blood sugar levels. o The goal for blood sugar control after surgery is 80-180 mg/dL.      WHAT DO I DO ABOUT MY DIABETES MEDICATION?   Marland Kitchen Do not take oral diabetes medicines (pills) the morning of surgery. Amaryl  . THE NIGHT BEFORE SURGERY, take ___________ units of ___________insulin.       Marland Kitchen HE MORNING OF SURGERY, take _____________ units of __________insulin.  . The day of surgery, do not take other diabetes injectables, including Byetta (exenatide), Bydureon (exenatide ER), Victoza (liraglutide), or Trulicity (dulaglutide).  . If your CBG is greater than 220 mg/dL, you may take  of your sliding scale (correction) dose of insulin.  Other Instructions:  Patient Signature:  Date:   Nurse Signature:  Date:   Reviewed and Endorsed by [facility] Patient Education Committee, August 2015  Do not wear jewelry, make-up or nail polish.  Do not wear lotions, powders, or perfumes.  You may wear deoderant.  Do not shave 48 hours prior to surgery.  Men may shave face and neck.  Do not bring valuables to the hospital.  [facility] is not responsible for any belongings or valuables.  Contacts, dentures or bridgework may not be worn into surgery.  Leave your suitcase in the car.  After surgery it may be brought to your  room.  For patients admitted to the hospital, discharge time will be determined by your treatment team.  Patients discharged the day of surgery will not be allowed to drive home.   Special instructions:  [place] - Preparing for Surgery  Before surgery, you can play an important role.  Because skin is not sterile, your skin needs to be as free of germs as possible.  You can reduce the number of germs on you skin by washing with CHG (chlorahexidine gluconate) soap before surgery.  CHG is an antiseptic cleaner which kills germs and bonds with the skin to continue killing germs even after washing.  Please DO NOT use if you have an allergy to CHG or antibacterial soaps.  If your skin becomes reddened/irritated stop using the CHG and inform your nurse when you arrive at Short Stay.  Do not shave (including legs and underarms) for at least 48 hours prior to the first CHG shower.  You may shave your face.  Please follow these instructions carefully:   1.  Shower with CHG Soap the night before surgery and the                                morning of Surgery.  2.  If you choose to wash your hair, wash your hair first as usual with your       normal shampoo.  3.  After you shampoo, rinse your hair and body thoroughly to remove the                      Shampoo.  4.  Use CHG as you would any other liquid soap.  You can apply chg directly       to the skin and wash gently with scrungie or a clean washcloth.  5.  Apply the CHG Soap to your body ONLY FROM THE NECK DOWN.        Do not use on open wounds or open sores.  Avoid contact with your eyes,       ears, mouth and genitals (private parts).  Wash genitals (private parts)       with your normal soap.  6.  Wash thoroughly, paying special attention to the area where your surgery        will be performed.  7.  Thoroughly rinse your body with warm water from the neck down.  8.  DO NOT shower/wash with your normal soap after using and rinsing off       the  CHG Soap.  9.  Pat yourself dry with a clean towel.            10.  Wear clean pajamas.            11.  Place clean sheets on  your bed the night of your first shower and do not        sleep with pets.  Day of Surgery  Do not apply any lotions/deoderants the morning of surgery.  Please wear clean clothes to the hospital/surgery center.     Please read over the following fact sheets that you were given. Pain Booklet and Surgical Site Infection Prevention, Incentive spirometry

## 2015-09-13 NOTE — Progress Notes (Signed)
PCP is Dr. Dagmar Hait Cardiologist is Dr Fidela Juneau- he manages his pacemaker- Form faxed to Dr Fidela Juneau  Requested last office visit and most recent labs from Dr Dagmar Hait (including last A1C) Order noted for PT, PTT and I stat on the day of surgery. Christopher [name] states he took last dose of coumadin 09-12-15 Reports he does not check his blood sugars- and last A1c was 5. Something.

## 2015-09-14 ENCOUNTER — Encounter (HOSPITAL_COMMUNITY): Payer: Self-pay | Admitting: Emergency Medicine

## 2015-09-14 ENCOUNTER — Encounter: Payer: Self-pay | Admitting: Cardiology

## 2015-09-14 ENCOUNTER — Other Ambulatory Visit (HOSPITAL_COMMUNITY): Payer: Medicare Other

## 2015-09-14 DIAGNOSIS — Z7901 Long term (current) use of anticoagulants: Secondary | ICD-10-CM | POA: Diagnosis not present

## 2015-09-14 DIAGNOSIS — E1122 Type 2 diabetes mellitus with diabetic chronic kidney disease: Secondary | ICD-10-CM | POA: Diagnosis not present

## 2015-09-14 DIAGNOSIS — I2510 Atherosclerotic heart disease of native coronary artery without angina pectoris: Secondary | ICD-10-CM | POA: Diagnosis not present

## 2015-09-14 DIAGNOSIS — R0602 Shortness of breath: Secondary | ICD-10-CM | POA: Diagnosis not present

## 2015-09-14 DIAGNOSIS — Z951 Presence of aortocoronary bypass graft: Secondary | ICD-10-CM | POA: Diagnosis not present

## 2015-09-14 DIAGNOSIS — I119 Hypertensive heart disease without heart failure: Secondary | ICD-10-CM | POA: Diagnosis not present

## 2015-09-14 DIAGNOSIS — E785 Hyperlipidemia, unspecified: Secondary | ICD-10-CM | POA: Diagnosis not present

## 2015-09-14 DIAGNOSIS — N184 Chronic kidney disease, stage 4 (severe): Secondary | ICD-10-CM | POA: Diagnosis not present

## 2015-09-14 DIAGNOSIS — I480 Paroxysmal atrial fibrillation: Secondary | ICD-10-CM | POA: Diagnosis not present

## 2015-09-14 DIAGNOSIS — Z950 Presence of cardiac pacemaker: Secondary | ICD-10-CM | POA: Diagnosis not present

## 2015-09-14 DIAGNOSIS — I5032 Chronic diastolic (congestive) heart failure: Secondary | ICD-10-CM | POA: Diagnosis not present

## 2015-09-14 NOTE — Progress Notes (Addendum)
Anesthesia Chart Review:  Pt is an 82 year old male scheduled for L AV fistula creation on 09/18/2015 with Curt Jews, MD.   PCP is Prince Solian, MD who is aware of upcoming surgery. Cardiologist is Tollie Eth, MD who has cleared pt for surgery.    PMH includes:  CAD (s/p CABG 2003), atrial fibrillation, CHF, symptomatic bradycardia, pacemaker, HTN, DM, CKD (stage 4), prostate cancer. Former smoker. BMI 31. S/p cannulated hip pinning 06/30/14.   Pt hospitalized 5/10-12/17 for hypoxia assoc with low grade fever.   Medications include: amiodarone, atenolol, lasix, gimepiride, metolazone, coumadin. Pt to hold coumadin 5 days before surgery.   Labs from PCP's office 09/11/15 reviewed.  - Cr 3.4, BUN 77; this is consistent with prior results - H/H 10.0/32.4, consistent with prior results  Chest x-ray 07/20/15 reviewed. Cardiomegaly. No acute findings  EKG 07/20/15: Atrial-paced rhythm. IVCD, consider atypical RBBB  Echo 07/24/13:  - Left ventricle: The cavity size was normal. Wall thickness was increased in a pattern of mild LVH. Systolic function was normal. The estimated ejection fraction was in the range of 55% to 60%. There is diastolic dysfunction, indeterminate grade. There is evidence of elevated LA pressure E/e&' 15. - Aortic valve: Mildly calcified annulus. Trileaflet; mildlythickened leaflets. Valve area (VTI): 2.66 cm^2. Valve area (Vmax): 2.27 cm^2. - Mitral valve: Mildly calcified annulus. Mildly thickened leaflets. There was mild regurgitation. - Left atrium: The atrium was moderately dilated. - Right atrium: The atrium was mildly dilated. - Tricuspid valve: There was mild-moderate regurgitation. - Pulmonic valve: There was moderate regurgitation. - Pulmonary arteries: PASP is at least 49 mmHg, unable to estimate RA pressure becaue IV not visualized. PASP is at least moderately elevated. - Technically difficult study.  Cardiac cath 12/20/09:  - Widely patent SVG to OM1 and  OM2 and LIMA to LAD - Moderate LM disease (60-70%) with occlusion of LAD - Minimal disease noted in other vessels at this time.  - Normal LV function with normal LV end-diastolic pressure.   If no changes, I anticipate pt can proceed with surgery as scheduled.   Willeen Cass, FNP-BC [facility]/Anesthesiology Phone: [phone] 09/15/2015 3:59 PM

## 2015-09-14 NOTE — Progress Notes (Signed)
Christopher [name] C  Date of visit:  09/14/2015 DOB:  [birth year]    Age:  82 yrs. Medical record number:  [id number]     Account number:  [id number] Primary Care Provider: Prince Solian R ____________________________ CURRENT DIAGNOSES  1. CAD Native without angina  2. Chronic diastolic (congestive) heart failure  3. Paroxysmal atrial fibrillation  4. Chronic kidney disease, stage 4 (severe)  5. Hypertensive heart disease without heart failure  6. Long term (current) use of anticoagulants  7. Type 2 Diabetes Mellitus With Diabetic Chronic Kidney Disease  8. Hyperlipidemia  9. Presence of cardiac pacemaker  10. Presence of aortocoronary bypass graft  11. Dyspnea ____________________________ ALLERGIES  Atorvastatin, Joint aches  Hydrocodone, Intolerance-vomiting  Pravastatin, Joint aches  Rosuvastatin, Aches  Tramadol, Nausea ____________________________ MEDICATIONS  1. Fish Oil 1,000 mg Capsule, BID  2. glimepiride 1 mg Tablet, 1 p.o. daily  3. folic acid 657 mcg Tablet, 1 p.o. q.d.  4. horse chestnut 300 mg Capsule, 1 p.o. daily  5. multivitamin Capsule, 1 p.o. q.d.  6. nitroglycerin 0.4 mg sublingual tablet, PRN  7. oxycodone 5 mg tablet, PRN  8. methocarbamol 500 mg tablet, PRN  9. senna 8.6 mg tablet, 2 p.o. b.i.d.  10. atenolol 25 mg tablet, 1 p.o. daily  11. tamsulosin 0.4 mg capsule, 1 p.o. daily  12. Calcium 500 With D 500 mg (1,250 mg)-400 unit tablet, 3 p.o. b.i.d.  13. Renvela 800 mg tablet, 2 tid  14. warfarin 5 mg tablet, 1 p.o. daily or as directed  15. cholecalciferol (vitamin D3) 1,000 unit chewable tablet, 2 tablets PO daily  16. Glucosamine 500 mg tablet, 3 tablets BID  17. furosemide 80 mg tablet, BID or as directed  18. glimepiride 1 mg tablet, 1 p.o. daily  19. metolazone 2.5 mg tablet, 1 p.o. daily prior to taking furosemide as directed  20. Breo Ellipta 100 mcg-25 mcg/dose powder for inhalation, 1 puff Daily  21. metolazone 2.5 mg tablet, QOD  22.  amiodarone 200 mg tablet, 1 p.o. daily ____________________________ CHIEF COMPLAINTS  Dyspnea with exertion ____________________________ HISTORY OF PRESENT ILLNESS Patient seen for cardiac followup early. The patient is due to have hip surgery at the end of August and a decision was made to place a fistula and his forearm because he now has stage 4-5 chronic kidney disease. He noted some worsening dyspnea and went to see his primary physician who started him on metolazone and was seen yesterday by the PA without significant weight loss. Metolazone was needed and he was asked to see me today. He comes in today and is not really sure why he is here and was a little confused on his history. I did receive good evidence for the primary doctor. He is dyspneic on walking down the hall and is significantly dyspneic with exertion and has to stop every 50 feet. This has occurred fairly recently. Interrogation of his pacemaker today shows that he went back into atrial fibrillation on June 3. He has been in chronic atrial fibrillation pretty much since then. He previously has had significant dyspnea when he has been in atrial fibrillation. He has some PND and orthopnea as well as some mild edema. He had crackles on his lung exam today. He has not had any anginal type pain. He is fatigued with significant dyspnea. He has held his warfarin for one day and a pro time yesterday showed an INR of 3.1. He denies syncope or other symptoms. ____________________________ PAST HISTORY  Past  Medical Illnesses:  hypertension, DM-non-insulin dependent, hyperlipidemia, lumbar disc disease, BPH, chronic kidney disease Stage 4, shingles, obesity, renovascular hypertension, prostate cancer treated with hormonal treatments;  Cardiovascular Illnesses:  CAD, sinus node dysfunction, atrial fibrillation, CHF diastolic;  Infectious Diseases:  no previous history of significant infectious diseases;  Surgical Procedures:  CABG w LIMA to LAD, SVG  to OM-OM2 05/05/01 [place], appendectomy, lumbar laminectomy x 2, hip repair right;  Trauma History:  no previous history of significant trauma;  NYHA Classification:  II;  Canadian Angina Classification:  Class 0: Asymptomatic;  Cardiology Procedures-Invasive:  cardiac cath (left) October 2011, [employer] pacemaker implant May 2012, cardioversion May 2013, cardioversion May 2014;  Cardiology Procedures-Noninvasive:  treadmill cardiolite October 2010, event monitor September 2011, echocardiogram May 2015;  Cardiac Cath Results:  60% left main, occluded  proximal LAD, widely patent OM 1 OM 2 SVG, widely patent RCA, widely patent LAD LIMA graft;  Peripheral Vascular Procedures:  no previous invasive peripheral vascular procedures.;  LVEF of 55% documented via echocardiogram on 01/16/2015,  CHADS Score:  3,  ____________________________ CARDIO-PULMONARY TEST DATES EKG Date:  09/14/2015;   Cardiac Cath Date:  12/20/2009;  CABG: 05/05/2001;  Nuclear Study Date:  12/29/2008;  Echocardiography Date: 01/16/2015;  Chest Xray Date: 07/20/2013;   ____________________________ FAMILY HISTORY Brother -- Brother dead, Coronary Artery Disease Brother -- Brother alive and well Father -- Father dead, Cancer, Deceased Mother -- Mother dead, Cancer, Deceased ____________________________ SOCIAL HISTORY Alcohol Use:  does not use alcohol;  Smoking:  used to smoke but quit 1970;  Diet:  regular diet;  Lifestyle:  widowed and from first wife and remarried second wife;  Exercise:  exercise is limited due to physical disability;  Occupation:  retired and Educational psychologist person;  Residence:  lives with wife;   ____________________________ REVIEW OF SYSTEMS General:  weight gain  Integumentary:no rashes or new skin lesions. Eyes: cataract extraction bilaterally, wears eye glasses/contact lenses Respiratory: dyspnea Cardiovascular:  please review HPI Abdominal: denies dyspepsia, GI bleeding, constipation, or  diarrhea Genitourinary-Male: nocturia  Musculoskeletal:  severe chronic low back pain, severe hip pain Neurological:  denies headaches, stroke, or TIA  ____________________________ PHYSICAL EXAMINATION VITAL SIGNS  Blood Pressure:  96/60 Sitting, Right arm, regular cuff  , 100/62 Standing, Right arm and regular cuff   Pulse:  88/min. Weight:  235.00 lbs. Height:  74"BMI: 30  Constitutional:  pleasant white male in no acute distress walks with walker Skin:  scattered hemangiomas, scattered sebaceous cysts Head:  normocephalic, normal hair pattern, no masses or tenderness Eyes:  EOMS Intact, PERRLA, C and S clear, Funduscopic exam not done. ENT:  ears, nose and throat reveal no gross abnormalities.  Dentition good. Neck:  supple, without massess. No JVD, thyromegaly or carotid bruits. Carotid upstroke normal. Chest:  fine rales both bases, healed median sternotomy scar Cardiac:  regular rhythm, normal S1 and S2, No S3 or S4, no murmurs, gallops or rubs detected. Peripheral Pulses:  the femoral,dorsalis pedis, and posterior tibial pulses are full and equal bilaterally with no bruits auscultated. Extremities & Back:  well healed saphenous vein donor site LLE, no edema present Neurological:  no gross motor or sensory deficits noted, affect appropriate, oriented x3. ____________________________ MOST RECENT LIPID PANEL 02/10/15  CHOL TOTL 204 mg/dl, LDL 135 NM, HDL 43 mg/dl, TRIGLYCER 132 mg/dl, ALT 21 u/l, ALK PHOS 53 u/l, CHOL/HDL 4.7 (Calc) and AST 25 u/l ____________________________ IMPRESSIONS/PLAN  1. Recurrent acute on chronic diastolic heart failure likely due to  the interval development of atrial fibrillation 2. Paroxysmal atrial fibrillation with recurrence 3. Long term use of anticoagulants 4. Coronary artery disease with previous bypass grafting 5. Stage 4-5 chronic kidney disease  Recommendations:  He will need to cancel his vascular surgery as his heart failure is  decompensated. I asked him to resume taking his warfarin and he is to continue on amiodarone. He likely will need to have an early cardioversion. In the meantime I asked him to increase his furosemide to 160 mg twice daily and continue metolazone and be seen back here in 3 days. We will tentatively schedule the cardioversion next week.  Greater than 40 minutes spent with patient and wife including greater than 50% of time spent face-to-face. Extensive records recently reviewed. Communication with referring doctors. ____________________________ TODAYS ORDERS  1. Return Visit: 3 days  2. 12 Lead EKG: Today                       ____________________________ Cardiology Physician:  Kerry Hough MD [facility]

## 2015-09-15 MED ORDER — VANCOMYCIN HCL 10 G IV SOLR
1500.0000 mg | INTRAVENOUS | Status: DC
  Filled 2015-09-15: qty 1500

## 2015-09-18 ENCOUNTER — Ambulatory Visit (HOSPITAL_COMMUNITY): Admission: RE | Admit: 2015-09-18 | Payer: Medicare Other | Source: Ambulatory Visit | Admitting: Vascular Surgery

## 2015-09-18 ENCOUNTER — Encounter (HOSPITAL_COMMUNITY): Admission: RE | Payer: Self-pay | Source: Ambulatory Visit

## 2015-09-18 DIAGNOSIS — Z950 Presence of cardiac pacemaker: Secondary | ICD-10-CM | POA: Diagnosis not present

## 2015-09-18 DIAGNOSIS — Z7901 Long term (current) use of anticoagulants: Secondary | ICD-10-CM | POA: Diagnosis not present

## 2015-09-18 DIAGNOSIS — N184 Chronic kidney disease, stage 4 (severe): Secondary | ICD-10-CM | POA: Diagnosis not present

## 2015-09-18 DIAGNOSIS — I119 Hypertensive heart disease without heart failure: Secondary | ICD-10-CM | POA: Diagnosis not present

## 2015-09-18 DIAGNOSIS — Z951 Presence of aortocoronary bypass graft: Secondary | ICD-10-CM | POA: Diagnosis not present

## 2015-09-18 DIAGNOSIS — I480 Paroxysmal atrial fibrillation: Secondary | ICD-10-CM | POA: Diagnosis not present

## 2015-09-18 DIAGNOSIS — I4891 Unspecified atrial fibrillation: Secondary | ICD-10-CM | POA: Diagnosis not present

## 2015-09-18 DIAGNOSIS — I5032 Chronic diastolic (congestive) heart failure: Secondary | ICD-10-CM | POA: Diagnosis not present

## 2015-09-18 DIAGNOSIS — E1122 Type 2 diabetes mellitus with diabetic chronic kidney disease: Secondary | ICD-10-CM | POA: Diagnosis not present

## 2015-09-18 DIAGNOSIS — R0602 Shortness of breath: Secondary | ICD-10-CM | POA: Diagnosis not present

## 2015-09-18 DIAGNOSIS — E785 Hyperlipidemia, unspecified: Secondary | ICD-10-CM | POA: Diagnosis not present

## 2015-09-18 DIAGNOSIS — I2510 Atherosclerotic heart disease of native coronary artery without angina pectoris: Secondary | ICD-10-CM | POA: Diagnosis not present

## 2015-09-18 SURGERY — ARTERIOVENOUS (AV) FISTULA CREATION
Anesthesia: Monitor Anesthesia Care | Laterality: Left

## 2015-09-18 NOTE — H&P (Signed)
Christopher [name] C  Date of visit:  09/18/2015 DOB:  10-04-1926    Age:  82 yrs. Medical record number:  [id number]     Account number:  [id number] Primary Care Provider: Prince Solian R ____________________________ CURRENT DIAGNOSES  1. CAD Native without angina  2. Chronic diastolic (congestive) heart failure  3. Paroxysmal atrial fibrillation  4. Chronic kidney disease, stage 4 (severe)  5. Hypertensive heart disease without heart failure  6. Long term (current) use of anticoagulants  7. Type 2 Diabetes Mellitus With Diabetic Chronic Kidney Disease  8. Hyperlipidemia  9. Presence of cardiac pacemaker  10. Presence of aortocoronary bypass graft  11. Dyspnea ____________________________ ALLERGIES  Atorvastatin, Joint aches  Hydrocodone, Intolerance-vomiting  Pravastatin, Joint aches  Rosuvastatin, Aches  Tramadol, Nausea ____________________________ MEDICATIONS  1. Fish Oil 1,000 mg Capsule, BID  2. glimepiride 1 mg Tablet, 1 p.o. daily  3. folic acid 696 mcg Tablet, 1 p.o. q.d.  4. horse chestnut 300 mg Capsule, 1 p.o. daily  5. multivitamin Capsule, 1 p.o. q.d.  6. nitroglycerin 0.4 mg sublingual tablet, PRN  7. atenolol 25 mg tablet, 1 p.o. daily  8. Calcium 500 With D 500 mg (1,250 mg)-400 unit tablet, 3 p.o. b.i.d.  9. Renvela 800 mg tablet, 2 tid  10. warfarin 5 mg tablet, 1 p.o. daily or as directed  11. cholecalciferol (vitamin D3) 1,000 unit chewable tablet, 2 tablets PO daily  12. Glucosamine 500 mg tablet, 3 tablets BID  13. furosemide 80 mg tablet, BID or as directed  14. glimepiride 1 mg tablet, 1 p.o. daily  15. metolazone 2.5 mg tablet, 1 p.o. daily prior to taking furosemide as directed  16. Breo Ellipta 100 mcg-25 mcg/dose powder for inhalation, 1 puff Daily  17. metolazone 2.5 mg tablet, QOD  18. amiodarone 200 mg tablet, 1 p.o. daily  19. Miralax 17 gram oral powder packet, 1 p.o. daily ____________________________ HISTORY OF PRESENT ILLNESS Patient seen  for recurrent evaluation. He has lost 9 pounds of weight and his breathing is better. He continues to be fatigued and tired and remains in atrial fibrillation. He was noted by pacemaker interrogation to go and atrial fibrillation back at the end of May. Following this he began to have fluid retention and I saw him urgently last week.Pro time INR was therapeutic today. The surgery was canceled for the fistula being put in. He is due to have hip repair at some point also. Although the weight is not down here he feels better and has less rales today and the edema appears to be improved today. We discussed converting him to sinus rhythm with cardioversion as he has been asymptomatic before. ____________________________ PAST HISTORY  Past Medical Illnesses:  hypertension, DM-non-insulin dependent, hyperlipidemia, lumbar disc disease, BPH, chronic kidney disease Stage 4, shingles, obesity, renovascular hypertension, prostate cancer treated with hormonal treatments;  Cardiovascular Illnesses:  CAD, sinus node dysfunction, atrial fibrillation, CHF diastolic;  Infectious Diseases:  no previous history of significant infectious diseases;  Surgical Procedures:  CABG w LIMA to LAD, SVG to OM-OM2 05/05/01 [place], appendectomy, lumbar laminectomy x 2, hip repair right;  Trauma History:  no previous history of significant trauma;  NYHA Classification:  II;  Canadian Angina Classification:  Class 0: Asymptomatic;  Cardiology Procedures-Invasive:  cardiac cath (left) October 2011, Medtronic pacemaker implant May 2012, cardioversion May 2013, cardioversion May 2014;  Cardiology Procedures-Noninvasive:  treadmill cardiolite October 2010, event monitor September 2011, echocardiogram May 2015;  Cardiac Cath Results:  60% left main,  occluded  proximal LAD, widely patent OM 1 OM 2 SVG, widely patent RCA, widely patent LAD LIMA graft;  Peripheral Vascular Procedures:  no previous invasive peripheral vascular procedures.;  LVEF of  55% documented via echocardiogram on 01/16/2015,  CHADS Score:  3,  ____________________________ CARDIO-PULMONARY TEST DATES EKG Date:  09/14/2015;   Cardiac Cath Date:  12/20/2009;  CABG: 05/05/2001;  Nuclear Study Date:  12/29/2008;  Echocardiography Date: 01/16/2015;  Chest Xray Date: 07/20/2013;   ____________________________ FAMILY HISTORY Brother -- Brother dead, Coronary Artery Disease Brother -- Brother alive and well Father -- Father dead, Cancer, Deceased Mother -- Mother dead, Cancer, Deceased ____________________________ SOCIAL HISTORY Alcohol Use:  does not use alcohol;  Smoking:  used to smoke but quit 1970;  Diet:  regular diet;  Lifestyle:  widowed and from first wife and remarried second wife;  Exercise:  exercise is limited due to physical disability;  Occupation:  retired and Educational psychologist person;  Residence:  lives with wife;   ____________________________ REVIEW OF SYSTEMS General:  denies recent weight change, fatique or change in exercise tolerance.  Integumentary:no rashes or new skin lesions. Eyes: cataract extraction bilaterally, wears eye glasses/contact lenses Ears, Nose, Throat, Mouth:  denies any hearing loss, epistaxis, hoarseness or difficulty speaking. Respiratory: dyspnea Cardiovascular:  please review HPI Abdominal: denies dyspepsia, GI bleeding, constipation, or diarrhea Genitourinary-Male: nocturia  Musculoskeletal:  severe chronic low back pain, severe hip pain Neurological:  denies headaches, stroke, or TIA  ____________________________ PHYSICAL EXAMINATION VITAL SIGNS  Blood Pressure:  100/62 Sitting, Left arm, large cuff  , 100/64 Standing, Left arm and large cuff   Pulse:  80/min. Weight:  235.00 lbs. Height:  74"BMI: 30  Constitutional:  pleasant white male in no acute distress walks with walker Skin:  scattered hemangiomas, scattered sebaceous cysts Head:  normocephalic, normal hair pattern, no masses or tenderness Eyes:  EOMS Intact,  PERRLA, C and S clear, Funduscopic exam not done. ENT:  ears, nose and throat reveal no gross abnormalities.  Dentition good. Neck:  supple, without massess. No JVD, thyromegaly or carotid bruits. Carotid upstroke normal. Chest:  fine rales both bases, healed median sternotomy scar Cardiac:  regular rhythm, normal S1 and S2, No S3 or S4, no murmurs, gallops or rubs detected. Abdomen:  abdomen soft,non-tender, no masses, no hepatospenomegaly, or aneurysm noted Peripheral Pulses:  the femoral,dorsalis pedis, and posterior tibial pulses are full and equal bilaterally with no bruits auscultated. Extremities & Back:  well healed saphenous vein donor site LLE, 1+ edema Neurological:  no gross motor or sensory deficits noted, affect appropriate, oriented x3. ____________________________ MOST RECENT LIPID PANEL 02/10/15  CHOL TOTL 204 mg/dl, LDL 135 NM, HDL 43 mg/dl, TRIGLYCER 132 mg/dl, ALT 21 u/l, ALK PHOS 53 u/l, CHOL/HDL 4.7 (Calc) and AST 25 u/l ____________________________ IMPRESSIONS/PLAN  1. Persistent atrial fibrillation for several weeks resulting in fluid retention and symptomatology 2. CAD with previous bypass grafting 3. Chronic diastolic dysfunction 4. Stage IV-5 chronic kidney disease 5. Long-term use of anticoagulation currently anticoagulated  Recommendations:  Plan elective cardioversion on Thursday. Cardioversion discussed with the patient including risks of stroke, arrhythmia, death, or anesthesia risks. The patient understands and is willing to proceed.  ____________________________ TODAYS ORDERS  1. Comprehensive Metabolic Panel: Today  2. Complete Blood Count: Today  3. Coag Clinic Visit: Coag OV 1 month  4. Cardioversion Thursday                   ____________________________ Cardiology Physician:  Kerry Hough MD [facility]

## 2015-09-20 ENCOUNTER — Ambulatory Visit (HOSPITAL_COMMUNITY)
Admission: RE | Admit: 2015-09-20 | Discharge: 2015-09-20 | Disposition: A | Discharge status: Home / Self Care | Payer: Medicare Other | Source: Ambulatory Visit | Attending: Nephrology | Admitting: Nephrology

## 2015-09-20 DIAGNOSIS — Z5181 Encounter for therapeutic drug level monitoring: Secondary | ICD-10-CM | POA: Insufficient documentation

## 2015-09-20 DIAGNOSIS — N184 Chronic kidney disease, stage 4 (severe): Secondary | ICD-10-CM | POA: Diagnosis not present

## 2015-09-20 DIAGNOSIS — D631 Anemia in chronic kidney disease: Secondary | ICD-10-CM | POA: Diagnosis not present

## 2015-09-20 DIAGNOSIS — Z79899 Other long term (current) drug therapy: Secondary | ICD-10-CM | POA: Insufficient documentation

## 2015-09-20 LAB — FERRITIN: FERRITIN: 667 ng/mL — AB (ref 24–336)

## 2015-09-20 LAB — IRON AND TIBC
Iron: 157 ug/dL (ref 45–182)
Saturation Ratios: 44 % — ABNORMAL HIGH (ref 17.9–39.5)
TIBC: 354 ug/dL (ref 250–450)
UIBC: 197 ug/dL

## 2015-09-20 LAB — POCT HEMOGLOBIN-HEMACUE: Hemoglobin: 10.5 g/dL — ABNORMAL LOW (ref 13.0–17.0)

## 2015-09-20 MED ORDER — DARBEPOETIN ALFA 100 MCG/0.5ML IJ SOSY
100.0000 ug | PREFILLED_SYRINGE | INTRAMUSCULAR | Status: DC
  Administered 2015-09-20: 100 ug via SUBCUTANEOUS

## 2015-09-20 MED ORDER — DARBEPOETIN ALFA 100 MCG/0.5ML IJ SOSY
PREFILLED_SYRINGE | INTRAMUSCULAR | Status: AC
  Filled 2015-09-20: qty 0.5

## 2015-09-21 ENCOUNTER — Encounter (HOSPITAL_COMMUNITY)
Admission: RE | Disposition: A | Discharge status: Home / Self Care | Payer: Self-pay | Source: Ambulatory Visit | Attending: Cardiology

## 2015-09-21 ENCOUNTER — Ambulatory Visit (HOSPITAL_COMMUNITY): Payer: Medicare Other | Admitting: Certified Registered Nurse Anesthetist

## 2015-09-21 ENCOUNTER — Ambulatory Visit (HOSPITAL_COMMUNITY)
Admission: RE | Admit: 2015-09-21 | Discharge: 2015-09-21 | Disposition: A | Discharge status: Home / Self Care | Payer: Medicare Other | Source: Ambulatory Visit | Attending: Cardiology | Admitting: Cardiology

## 2015-09-21 ENCOUNTER — Encounter (HOSPITAL_COMMUNITY): Payer: Self-pay | Admitting: Certified Registered Nurse Anesthetist

## 2015-09-21 DIAGNOSIS — I129 Hypertensive chronic kidney disease with stage 1 through stage 4 chronic kidney disease, or unspecified chronic kidney disease: Secondary | ICD-10-CM | POA: Diagnosis not present

## 2015-09-21 DIAGNOSIS — I2510 Atherosclerotic heart disease of native coronary artery without angina pectoris: Secondary | ICD-10-CM | POA: Insufficient documentation

## 2015-09-21 DIAGNOSIS — Z7901 Long term (current) use of anticoagulants: Secondary | ICD-10-CM | POA: Insufficient documentation

## 2015-09-21 DIAGNOSIS — E785 Hyperlipidemia, unspecified: Secondary | ICD-10-CM | POA: Insufficient documentation

## 2015-09-21 DIAGNOSIS — E669 Obesity, unspecified: Secondary | ICD-10-CM | POA: Diagnosis not present

## 2015-09-21 DIAGNOSIS — I5032 Chronic diastolic (congestive) heart failure: Secondary | ICD-10-CM | POA: Insufficient documentation

## 2015-09-21 DIAGNOSIS — Z87891 Personal history of nicotine dependence: Secondary | ICD-10-CM | POA: Insufficient documentation

## 2015-09-21 DIAGNOSIS — E1122 Type 2 diabetes mellitus with diabetic chronic kidney disease: Secondary | ICD-10-CM | POA: Insufficient documentation

## 2015-09-21 DIAGNOSIS — Z951 Presence of aortocoronary bypass graft: Secondary | ICD-10-CM | POA: Diagnosis not present

## 2015-09-21 DIAGNOSIS — Z7984 Long term (current) use of oral hypoglycemic drugs: Secondary | ICD-10-CM | POA: Insufficient documentation

## 2015-09-21 DIAGNOSIS — I4891 Unspecified atrial fibrillation: Secondary | ICD-10-CM | POA: Diagnosis not present

## 2015-09-21 DIAGNOSIS — I480 Paroxysmal atrial fibrillation: Secondary | ICD-10-CM | POA: Insufficient documentation

## 2015-09-21 DIAGNOSIS — Z6831 Body mass index (BMI) 31.0-31.9, adult: Secondary | ICD-10-CM | POA: Diagnosis not present

## 2015-09-21 DIAGNOSIS — N189 Chronic kidney disease, unspecified: Secondary | ICD-10-CM | POA: Diagnosis not present

## 2015-09-21 DIAGNOSIS — I481 Persistent atrial fibrillation: Secondary | ICD-10-CM | POA: Insufficient documentation

## 2015-09-21 DIAGNOSIS — Z8546 Personal history of malignant neoplasm of prostate: Secondary | ICD-10-CM | POA: Insufficient documentation

## 2015-09-21 DIAGNOSIS — I130 Hypertensive heart and chronic kidney disease with heart failure and stage 1 through stage 4 chronic kidney disease, or unspecified chronic kidney disease: Secondary | ICD-10-CM | POA: Insufficient documentation

## 2015-09-21 DIAGNOSIS — Z7951 Long term (current) use of inhaled steroids: Secondary | ICD-10-CM | POA: Diagnosis not present

## 2015-09-21 DIAGNOSIS — N184 Chronic kidney disease, stage 4 (severe): Secondary | ICD-10-CM | POA: Diagnosis not present

## 2015-09-21 DIAGNOSIS — Z950 Presence of cardiac pacemaker: Secondary | ICD-10-CM | POA: Insufficient documentation

## 2015-09-21 DIAGNOSIS — Z79899 Other long term (current) drug therapy: Secondary | ICD-10-CM | POA: Diagnosis not present

## 2015-09-21 HISTORY — PX: CARDIOVERSION: SHX1299

## 2015-09-21 SURGERY — CARDIOVERSION
Anesthesia: General

## 2015-09-21 MED ORDER — HYDROCORTISONE 1 % EX CREA: 1.0000 "application " | TOPICAL_CREAM | Freq: Three times a day (TID) | CUTANEOUS | Status: DC | PRN

## 2015-09-21 MED ORDER — SODIUM CHLORIDE 0.9 % IV SOLN
INTRAVENOUS | Status: DC
  Administered 2015-09-21: 13:00:00 via INTRAVENOUS

## 2015-09-21 MED ORDER — LIDOCAINE HCL (CARDIAC) 20 MG/ML IV SOLN
INTRAVENOUS | Status: DC | PRN
  Administered 2015-09-21: 100 mg via INTRATRACHEAL

## 2015-09-21 MED ORDER — PROPOFOL 10 MG/ML IV BOLUS
INTRAVENOUS | Status: DC | PRN
  Administered 2015-09-21: 60 mg via INTRAVENOUS

## 2015-09-21 MED ORDER — PHENYLEPHRINE HCL 10 MG/ML IJ SOLN
INTRAMUSCULAR | Status: DC | PRN
  Administered 2015-09-21: 100 ug via INTRAVENOUS

## 2015-09-21 NOTE — Discharge Instructions (Signed)

## 2015-09-21 NOTE — Anesthesia Postprocedure Evaluation (Signed)
Anesthesia Post Note  Patient: Christopher [name]  Procedure(s) Performed: Procedure(s) (LRB): CARDIOVERSION (N/A)  Patient location during evaluation: PACU Anesthesia Type: General Level of consciousness: sedated and patient cooperative Pain management: pain level controlled Vital Signs Assessment: post-procedure vital signs reviewed and stable Respiratory status: spontaneous breathing Cardiovascular status: stable Anesthetic complications: no    Last Vitals:  Filed Vitals:   09/21/15 1330 09/21/15 1340  BP: 111/75 126/69  Pulse: 70 70  Resp: 14 18    Last Pain: There were no vitals filed for this visit.               Nolon Nations

## 2015-09-21 NOTE — CV Procedure (Signed)
Electrical Cardioversion Procedure Note  Christopher [name]   82 y.o. male MRN: [id number] DOB: May 15, 1926  Today's date: 09/21/2015  Procedure: Electrical Cardioversion  Indications:  Atrial Fibrillation  Time Out: Verified patient identification, verified procedure,medications/allergies/relevent history reviewed, required imaging and test results available.  Performed  Procedure Details  The patient was NPO after midnight. Anesthesia was administered at the beside  by [facility] with lidocaine 100 mg and 60 mg of propofol.  Cardioversion was done with synchronized biphasic defibrillation with AP pads with 120 watts without success and then 200 watts with reversion to sinus rhythm verified by pacer interrogation .  The patient converted to normal sinus rhythm. The patient tolerated the procedure well   IMPRESSION:  Successful cardioversion of atrial fibrillation    W. Tollie Eth, Brooke Bonito. MD [facility]   09/21/2015, 1:20 PM

## 2015-09-21 NOTE — Anesthesia Preprocedure Evaluation (Signed)
Anesthesia Evaluation  Patient identified by MRN, date of birth, ID band Patient awake    Reviewed: Allergy & Precautions, H&P , NPO status , Patient's Chart, lab work & pertinent test results, reviewed documented beta blocker date and time   Airway Mallampati: II  TM Distance: >3 FB Neck ROM: Full    Dental no notable dental hx. (+) Teeth Intact, Dental Advisory Given   Pulmonary shortness of breath, pneumonia, former smoker,    Pulmonary exam normal breath sounds clear to auscultation       Cardiovascular hypertension, Pt. on medications and Pt. on home beta blockers + CAD, + Cardiac Stents, + CABG and +CHF  + dysrhythmias + pacemaker  Rhythm:Irregular Rate:Normal     Neuro/Psych negative neurological ROS  negative psych ROS   GI/Hepatic negative GI ROS, Neg liver ROS,   Endo/Other  negative endocrine ROSdiabetes  Renal/GU Renal InsufficiencyRenal disease     Musculoskeletal   Abdominal   Peds  Hematology negative hematology ROS (+)   Anesthesia Other Findings   Reproductive/Obstetrics negative OB ROS                             Anesthesia Physical  Anesthesia Plan  ASA: III  Anesthesia Plan: General   Post-op Pain Management:    Induction: Intravenous  Airway Management Planned: Mask  Additional Equipment:   Intra-op Plan:   Post-operative Plan:   Informed Consent: I have reviewed the patients History and Physical, chart, labs and discussed the procedure including the risks, benefits and alternatives for the proposed anesthesia with the patient or authorized representative who has indicated his/her understanding and acceptance.   Dental advisory given  Plan Discussed with: CRNA  Anesthesia Plan Comments:         Anesthesia Quick Evaluation

## 2015-09-21 NOTE — Transfer of Care (Signed)
Immediate Anesthesia Transfer of Care Note  Patient: Christopher [name]  Procedure(s) Performed: Procedure(s): CARDIOVERSION (N/A)  Patient Location: PACU and Endoscopy Unit  Anesthesia Type:General  Level of Consciousness: awake, alert , oriented and patient cooperative  Airway & Oxygen Therapy: Patient Spontanous Breathing and Patient connected to nasal cannula oxygen  Post-op Assessment: Report given to RN and Post -op Vital signs reviewed and stable  Post vital signs: Reviewed and stable  Last Vitals:  Filed Vitals:   09/21/15 1222  BP: 151/87  Pulse: 78  Resp: 15    Last Pain: There were no vitals filed for this visit.       Complications: No apparent anesthesia complications

## 2015-09-28 DIAGNOSIS — I2510 Atherosclerotic heart disease of native coronary artery without angina pectoris: Secondary | ICD-10-CM | POA: Diagnosis not present

## 2015-09-28 DIAGNOSIS — I119 Hypertensive heart disease without heart failure: Secondary | ICD-10-CM | POA: Diagnosis not present

## 2015-09-28 DIAGNOSIS — I480 Paroxysmal atrial fibrillation: Secondary | ICD-10-CM | POA: Diagnosis not present

## 2015-09-28 DIAGNOSIS — Z950 Presence of cardiac pacemaker: Secondary | ICD-10-CM | POA: Diagnosis not present

## 2015-09-28 DIAGNOSIS — I5032 Chronic diastolic (congestive) heart failure: Secondary | ICD-10-CM | POA: Diagnosis not present

## 2015-09-28 DIAGNOSIS — N184 Chronic kidney disease, stage 4 (severe): Secondary | ICD-10-CM | POA: Diagnosis not present

## 2015-09-28 DIAGNOSIS — Z951 Presence of aortocoronary bypass graft: Secondary | ICD-10-CM | POA: Diagnosis not present

## 2015-09-28 DIAGNOSIS — Z7901 Long term (current) use of anticoagulants: Secondary | ICD-10-CM | POA: Diagnosis not present

## 2015-09-28 DIAGNOSIS — R0602 Shortness of breath: Secondary | ICD-10-CM | POA: Diagnosis not present

## 2015-09-28 DIAGNOSIS — E785 Hyperlipidemia, unspecified: Secondary | ICD-10-CM | POA: Diagnosis not present

## 2015-09-28 DIAGNOSIS — E1122 Type 2 diabetes mellitus with diabetic chronic kidney disease: Secondary | ICD-10-CM | POA: Diagnosis not present

## 2015-10-05 ENCOUNTER — Encounter: Payer: Self-pay | Admitting: Nephrology

## 2015-10-05 DIAGNOSIS — D638 Anemia in other chronic diseases classified elsewhere: Secondary | ICD-10-CM | POA: Insufficient documentation

## 2015-10-11 ENCOUNTER — Ambulatory Visit (HOSPITAL_COMMUNITY)
Admission: RE | Admit: 2015-10-11 | Discharge: 2015-10-11 | Disposition: A | Discharge status: Home / Self Care | Payer: Medicare Other | Source: Ambulatory Visit | Attending: Nephrology | Admitting: Nephrology

## 2015-10-11 DIAGNOSIS — Z79899 Other long term (current) drug therapy: Secondary | ICD-10-CM | POA: Diagnosis not present

## 2015-10-11 DIAGNOSIS — I5032 Chronic diastolic (congestive) heart failure: Secondary | ICD-10-CM | POA: Diagnosis not present

## 2015-10-11 DIAGNOSIS — N184 Chronic kidney disease, stage 4 (severe): Secondary | ICD-10-CM | POA: Insufficient documentation

## 2015-10-11 DIAGNOSIS — Z5181 Encounter for therapeutic drug level monitoring: Secondary | ICD-10-CM | POA: Diagnosis not present

## 2015-10-11 DIAGNOSIS — E1122 Type 2 diabetes mellitus with diabetic chronic kidney disease: Secondary | ICD-10-CM | POA: Diagnosis not present

## 2015-10-11 DIAGNOSIS — Z7901 Long term (current) use of anticoagulants: Secondary | ICD-10-CM | POA: Diagnosis not present

## 2015-10-11 DIAGNOSIS — R0602 Shortness of breath: Secondary | ICD-10-CM | POA: Diagnosis not present

## 2015-10-11 DIAGNOSIS — I119 Hypertensive heart disease without heart failure: Secondary | ICD-10-CM | POA: Diagnosis not present

## 2015-10-11 DIAGNOSIS — E785 Hyperlipidemia, unspecified: Secondary | ICD-10-CM | POA: Diagnosis not present

## 2015-10-11 DIAGNOSIS — I2510 Atherosclerotic heart disease of native coronary artery without angina pectoris: Secondary | ICD-10-CM | POA: Diagnosis not present

## 2015-10-11 DIAGNOSIS — D631 Anemia in chronic kidney disease: Secondary | ICD-10-CM | POA: Insufficient documentation

## 2015-10-11 DIAGNOSIS — I480 Paroxysmal atrial fibrillation: Secondary | ICD-10-CM | POA: Diagnosis not present

## 2015-10-11 DIAGNOSIS — Z950 Presence of cardiac pacemaker: Secondary | ICD-10-CM | POA: Diagnosis not present

## 2015-10-11 DIAGNOSIS — Z951 Presence of aortocoronary bypass graft: Secondary | ICD-10-CM | POA: Diagnosis not present

## 2015-10-11 LAB — POCT HEMOGLOBIN-HEMACUE: Hemoglobin: 10.3 g/dL — ABNORMAL LOW (ref 13.0–17.0)

## 2015-10-11 MED ORDER — DARBEPOETIN ALFA 100 MCG/0.5ML IJ SOSY
100.0000 ug | PREFILLED_SYRINGE | INTRAMUSCULAR | Status: DC
  Administered 2015-10-11: 100 ug via SUBCUTANEOUS

## 2015-10-11 MED ORDER — DARBEPOETIN ALFA 100 MCG/0.5ML IJ SOSY
PREFILLED_SYRINGE | INTRAMUSCULAR | Status: AC
  Filled 2015-10-11: qty 0.5

## 2015-10-26 DIAGNOSIS — I2510 Atherosclerotic heart disease of native coronary artery without angina pectoris: Secondary | ICD-10-CM | POA: Diagnosis not present

## 2015-10-26 DIAGNOSIS — Z951 Presence of aortocoronary bypass graft: Secondary | ICD-10-CM | POA: Diagnosis not present

## 2015-10-26 DIAGNOSIS — E785 Hyperlipidemia, unspecified: Secondary | ICD-10-CM | POA: Diagnosis not present

## 2015-10-26 DIAGNOSIS — I119 Hypertensive heart disease without heart failure: Secondary | ICD-10-CM | POA: Diagnosis not present

## 2015-10-26 DIAGNOSIS — Z6831 Body mass index (BMI) 31.0-31.9, adult: Secondary | ICD-10-CM | POA: Diagnosis not present

## 2015-10-26 DIAGNOSIS — Z950 Presence of cardiac pacemaker: Secondary | ICD-10-CM | POA: Diagnosis not present

## 2015-10-26 DIAGNOSIS — R0602 Shortness of breath: Secondary | ICD-10-CM | POA: Diagnosis not present

## 2015-10-26 DIAGNOSIS — E1129 Type 2 diabetes mellitus with other diabetic kidney complication: Secondary | ICD-10-CM | POA: Diagnosis not present

## 2015-10-26 DIAGNOSIS — E1122 Type 2 diabetes mellitus with diabetic chronic kidney disease: Secondary | ICD-10-CM | POA: Diagnosis not present

## 2015-10-26 DIAGNOSIS — Z7901 Long term (current) use of anticoagulants: Secondary | ICD-10-CM | POA: Diagnosis not present

## 2015-10-26 DIAGNOSIS — I129 Hypertensive chronic kidney disease with stage 1 through stage 4 chronic kidney disease, or unspecified chronic kidney disease: Secondary | ICD-10-CM | POA: Diagnosis not present

## 2015-10-26 DIAGNOSIS — D631 Anemia in chronic kidney disease: Secondary | ICD-10-CM | POA: Diagnosis not present

## 2015-10-26 DIAGNOSIS — I5032 Chronic diastolic (congestive) heart failure: Secondary | ICD-10-CM | POA: Diagnosis not present

## 2015-10-26 DIAGNOSIS — I480 Paroxysmal atrial fibrillation: Secondary | ICD-10-CM | POA: Diagnosis not present

## 2015-10-26 DIAGNOSIS — N184 Chronic kidney disease, stage 4 (severe): Secondary | ICD-10-CM | POA: Diagnosis not present

## 2015-11-01 ENCOUNTER — Inpatient Hospital Stay: Admit: 2015-11-01 | Payer: Medicare Other | Admitting: Orthopedic Surgery

## 2015-11-01 ENCOUNTER — Ambulatory Visit (HOSPITAL_COMMUNITY)
Admission: RE | Admit: 2015-11-01 | Discharge: 2015-11-01 | Disposition: A | Discharge status: Home / Self Care | Payer: Medicare Other | Source: Ambulatory Visit | Attending: Nephrology | Admitting: Nephrology

## 2015-11-01 DIAGNOSIS — Z79899 Other long term (current) drug therapy: Secondary | ICD-10-CM | POA: Diagnosis not present

## 2015-11-01 DIAGNOSIS — N184 Chronic kidney disease, stage 4 (severe): Secondary | ICD-10-CM

## 2015-11-01 DIAGNOSIS — D631 Anemia in chronic kidney disease: Secondary | ICD-10-CM | POA: Diagnosis not present

## 2015-11-01 DIAGNOSIS — Z5181 Encounter for therapeutic drug level monitoring: Secondary | ICD-10-CM | POA: Diagnosis not present

## 2015-11-01 DIAGNOSIS — E875 Hyperkalemia: Secondary | ICD-10-CM | POA: Diagnosis not present

## 2015-11-01 LAB — IRON AND TIBC
Iron: 154 ug/dL (ref 45–182)
Saturation Ratios: 49 % — ABNORMAL HIGH (ref 17.9–39.5)
TIBC: 316 ug/dL (ref 250–450)
UIBC: 162 ug/dL

## 2015-11-01 LAB — FERRITIN: FERRITIN: 769 ng/mL — AB (ref 24–336)

## 2015-11-01 LAB — POCT HEMOGLOBIN-HEMACUE: Hemoglobin: 10.9 g/dL — ABNORMAL LOW (ref 13.0–17.0)

## 2015-11-01 SURGERY — CONVERSION, PREVIOUS HIP SURGERY, TO TOTAL HIP ARTHROPLASTY
Anesthesia: Choice | Site: Hip | Laterality: Right

## 2015-11-01 MED ORDER — DARBEPOETIN ALFA 100 MCG/0.5ML IJ SOSY
PREFILLED_SYRINGE | INTRAMUSCULAR | Status: AC
  Administered 2015-11-01: 100 ug via SUBCUTANEOUS
  Filled 2015-11-01: qty 0.5

## 2015-11-01 MED ORDER — DARBEPOETIN ALFA 100 MCG/0.5ML IJ SOSY
100.0000 ug | PREFILLED_SYRINGE | INTRAMUSCULAR | Status: DC
  Administered 2015-11-01: 100 ug via SUBCUTANEOUS

## 2015-11-02 ENCOUNTER — Other Ambulatory Visit: Payer: Self-pay

## 2015-11-02 DIAGNOSIS — D225 Melanocytic nevi of trunk: Secondary | ICD-10-CM | POA: Diagnosis not present

## 2015-11-02 DIAGNOSIS — D485 Neoplasm of uncertain behavior of skin: Secondary | ICD-10-CM | POA: Diagnosis not present

## 2015-11-02 DIAGNOSIS — Z85828 Personal history of other malignant neoplasm of skin: Secondary | ICD-10-CM | POA: Diagnosis not present

## 2015-11-02 DIAGNOSIS — D2372 Other benign neoplasm of skin of left lower limb, including hip: Secondary | ICD-10-CM | POA: Diagnosis not present

## 2015-11-02 DIAGNOSIS — D692 Other nonthrombocytopenic purpura: Secondary | ICD-10-CM | POA: Diagnosis not present

## 2015-11-02 DIAGNOSIS — C44319 Basal cell carcinoma of skin of other parts of face: Secondary | ICD-10-CM | POA: Diagnosis not present

## 2015-11-02 DIAGNOSIS — L821 Other seborrheic keratosis: Secondary | ICD-10-CM | POA: Diagnosis not present

## 2015-11-02 DIAGNOSIS — L720 Epidermal cyst: Secondary | ICD-10-CM | POA: Diagnosis not present

## 2015-11-07 ENCOUNTER — Encounter (HOSPITAL_COMMUNITY): Payer: Self-pay | Admitting: *Deleted

## 2015-11-07 MED ORDER — VANCOMYCIN HCL 10 G IV SOLR
1500.0000 mg | INTRAVENOUS | Status: AC
  Administered 2015-11-08: 1500 mg via INTRAVENOUS
  Filled 2015-11-07 (×2): qty 1500

## 2015-11-07 NOTE — Progress Notes (Signed)
Anesthesia Chart Review: SAME DAY  WORK-UP.  Pt is an 82 year old male scheduled for L AV fistula creation on 11/08/15 by Dr. Donnetta Hutching. Case was initiailly scheduled for 09/18/15 but was postponed due to patient developing acute on chronic diastolic CHF likely due to interval development of afib. He underwent successful cardioversion on 09/21/15.    PCP is Dr. Prince Solian. Nephrologist is Dr. Mercy Moore.  Cardiologist is Dr. Tollie Eth. He saw patient in follow-up on 10/26/15 (scanned under Media tab). EKG was not repeated at that visit, but by exam heart was RRR. Dr. Wynonia Lawman felt patient could proceed with AVF creation and gave permission to hold warfarin 5 days prior to surgery.   PMH includes: CAD s/p CABG (LIMA-LAD, SVG-OM2) 2003, atrial fibrillation s/p cardioversion 2013 and 2016 and [id number], diastolic CHF, symptomatic bradycardia s/p Medtronic dual chamber pacemaker 07/26/10, HTN, DM2, CKD (stage 4), prostate cancer. Former smoker. BMI 31. S/p cannulated hip pinning 06/30/14.   Medications include amiodarone, atenolol, Aranesp, folic acid, Lasix, glimepiride, Nitro, fish oil, Revela, warfarin.    Chest x-ray 07/20/15 reviewed. Cardiomegaly. No acute findings  EKG 09/21/15: Ventricular-paced rhythm.   Echo 07/24/13:  - Left ventricle: The cavity size was normal. Wall thickness was increased in a pattern of mild LVH. Systolic function was normal. The estimated ejection fraction was in the range of 55% to 60%. There is diastolic dysfunction, indeterminate grade. There is evidence of elevated LA pressure E/e&' 15. - Aortic valve: Mildly calcified annulus. Trileaflet; mildlythickened leaflets. Valve area (VTI): 2.66 cm^2. Valve area (Vmax): 2.27 cm^2. - Mitral valve: Mildly calcified annulus. Mildly thickened leaflets. There was mild regurgitation. - Left atrium: The atrium was moderately dilated. - Right atrium: The atrium was mildly dilated. - Tricuspid valve: There was mild-moderate  regurgitation. - Pulmonic valve: There was moderate regurgitation. - Pulmonary arteries: PASP is at least 49 mmHg, unable to estimate RA pressure becaue IV not visualized. PASP is at least moderately elevated. - Technically difficult study.  Cardiac cath 12/20/09:  - Widely patent SVG to OM1 and OM2 and LIMA to LAD - Moderate LM disease (60-70%) with occlusion of LAD - Minimal disease noted in other vessels at this time.  - Normal LV function with normal LV end-diastolic pressure.   He is for labs on arrival.   If no changes, I anticipate pt can proceed with surgery as scheduled.   George Hugh [facility] Short Stay Center/Anesthesiology Phone [phone] 11/07/2015 12:30 PM

## 2015-11-07 NOTE — Progress Notes (Signed)
Pt denies SOB and chest pain but is under the care of Dr. Wynonia Lawman, Cardiology. Pt stated that he rarely checks his blood sugar when educating pt on Diabetes protocol. Pt made aware to not take Glimepiride on morning of procedure. Pt made aware to stop taking vitamins, fish oil and herbal medications such as Glucosamine and horse Chestnut. Do not take any NSAIDs ie: Ibuprofen, Advil, Naproxen, BC and Goody powder. Pt stated that his last dose of Coumadin was Thursday, " I was told to stop 5 days before surgery." Pt stated that an A1c was drawn at PCP, Dr. Dagmar Hait " about 2 months ago;" records requested. Anesthesia asked to review pt records.

## 2015-11-07 NOTE — Progress Notes (Signed)
Pt stated that he had a stress test " about 10 years ago."

## 2015-11-08 ENCOUNTER — Ambulatory Visit (HOSPITAL_COMMUNITY): Payer: Medicare Other | Admitting: Vascular Surgery

## 2015-11-08 ENCOUNTER — Ambulatory Visit (HOSPITAL_COMMUNITY)
Admission: RE | Admit: 2015-11-08 | Discharge: 2015-11-08 | Disposition: A | Discharge status: Home / Self Care | Payer: Medicare Other | Source: Ambulatory Visit | Attending: Vascular Surgery | Admitting: Vascular Surgery

## 2015-11-08 ENCOUNTER — Encounter (HOSPITAL_COMMUNITY)
Admission: RE | Disposition: A | Discharge status: Home / Self Care | Payer: Self-pay | Source: Ambulatory Visit | Attending: Vascular Surgery

## 2015-11-08 ENCOUNTER — Encounter (HOSPITAL_COMMUNITY): Payer: Self-pay | Admitting: *Deleted

## 2015-11-08 ENCOUNTER — Other Ambulatory Visit: Payer: Self-pay | Admitting: *Deleted

## 2015-11-08 DIAGNOSIS — I4891 Unspecified atrial fibrillation: Secondary | ICD-10-CM | POA: Insufficient documentation

## 2015-11-08 DIAGNOSIS — Z951 Presence of aortocoronary bypass graft: Secondary | ICD-10-CM | POA: Insufficient documentation

## 2015-11-08 DIAGNOSIS — Z8546 Personal history of malignant neoplasm of prostate: Secondary | ICD-10-CM | POA: Insufficient documentation

## 2015-11-08 DIAGNOSIS — Z950 Presence of cardiac pacemaker: Secondary | ICD-10-CM | POA: Diagnosis not present

## 2015-11-08 DIAGNOSIS — N400 Enlarged prostate without lower urinary tract symptoms: Secondary | ICD-10-CM | POA: Diagnosis not present

## 2015-11-08 DIAGNOSIS — N184 Chronic kidney disease, stage 4 (severe): Secondary | ICD-10-CM | POA: Diagnosis not present

## 2015-11-08 DIAGNOSIS — I2510 Atherosclerotic heart disease of native coronary artery without angina pectoris: Secondary | ICD-10-CM | POA: Insufficient documentation

## 2015-11-08 DIAGNOSIS — I129 Hypertensive chronic kidney disease with stage 1 through stage 4 chronic kidney disease, or unspecified chronic kidney disease: Secondary | ICD-10-CM | POA: Diagnosis present

## 2015-11-08 DIAGNOSIS — Z4931 Encounter for adequacy testing for hemodialysis: Secondary | ICD-10-CM

## 2015-11-08 DIAGNOSIS — I130 Hypertensive heart and chronic kidney disease with heart failure and stage 1 through stage 4 chronic kidney disease, or unspecified chronic kidney disease: Secondary | ICD-10-CM | POA: Diagnosis not present

## 2015-11-08 DIAGNOSIS — Z87891 Personal history of nicotine dependence: Secondary | ICD-10-CM | POA: Insufficient documentation

## 2015-11-08 DIAGNOSIS — Z79899 Other long term (current) drug therapy: Secondary | ICD-10-CM | POA: Insufficient documentation

## 2015-11-08 DIAGNOSIS — N186 End stage renal disease: Secondary | ICD-10-CM

## 2015-11-08 DIAGNOSIS — E1122 Type 2 diabetes mellitus with diabetic chronic kidney disease: Secondary | ICD-10-CM | POA: Insufficient documentation

## 2015-11-08 DIAGNOSIS — N185 Chronic kidney disease, stage 5: Secondary | ICD-10-CM | POA: Diagnosis not present

## 2015-11-08 DIAGNOSIS — I5032 Chronic diastolic (congestive) heart failure: Secondary | ICD-10-CM | POA: Diagnosis not present

## 2015-11-08 DIAGNOSIS — Z7901 Long term (current) use of anticoagulants: Secondary | ICD-10-CM | POA: Insufficient documentation

## 2015-11-08 DIAGNOSIS — I1310 Hypertensive heart and chronic kidney disease without heart failure, with stage 1 through stage 4 chronic kidney disease, or unspecified chronic kidney disease: Secondary | ICD-10-CM | POA: Diagnosis not present

## 2015-11-08 HISTORY — PX: AV FISTULA PLACEMENT: SHX1204

## 2015-11-08 LAB — APTT: aPTT: 32 seconds (ref 24–36)

## 2015-11-08 LAB — POCT I-STAT 4, (NA,K, GLUC, HGB,HCT)
GLUCOSE: 102 mg/dL — AB (ref 65–99)
HCT: 35 % — ABNORMAL LOW (ref 39.0–52.0)
Hemoglobin: 11.9 g/dL — ABNORMAL LOW (ref 13.0–17.0)
POTASSIUM: 4.5 mmol/L (ref 3.5–5.1)
SODIUM: 143 mmol/L (ref 135–145)

## 2015-11-08 LAB — PROTIME-INR
INR: 1.11
PROTHROMBIN TIME: 14.3 s (ref 11.4–15.2)

## 2015-11-08 LAB — GLUCOSE, CAPILLARY: Glucose-Capillary: 81 mg/dL (ref 65–99)

## 2015-11-08 SURGERY — ARTERIOVENOUS (AV) FISTULA CREATION
Anesthesia: Monitor Anesthesia Care | Site: Arm Lower | Laterality: Left

## 2015-11-08 MED ORDER — FENTANYL CITRATE (PF) 250 MCG/5ML IJ SOLN
INTRAMUSCULAR | Status: AC
  Filled 2015-11-08: qty 5

## 2015-11-08 MED ORDER — CHLORHEXIDINE GLUCONATE CLOTH 2 % EX PADS: 6.0000 | MEDICATED_PAD | Freq: Once | CUTANEOUS | Status: DC

## 2015-11-08 MED ORDER — 0.9 % SODIUM CHLORIDE (POUR BTL) OPTIME
TOPICAL | Status: DC | PRN
  Administered 2015-11-08: 1000 mL

## 2015-11-08 MED ORDER — FENTANYL CITRATE (PF) 100 MCG/2ML IJ SOLN: 25.0000 ug | INTRAMUSCULAR | Status: DC | PRN

## 2015-11-08 MED ORDER — LIDOCAINE-EPINEPHRINE (PF) 1 %-1:200000 IJ SOLN
INTRAMUSCULAR | Status: AC
  Filled 2015-11-08: qty 30

## 2015-11-08 MED ORDER — HEPARIN SODIUM (PORCINE) 5000 UNIT/ML IJ SOLN
INTRAMUSCULAR | Status: DC | PRN
  Administered 2015-11-08: 10:00:00

## 2015-11-08 MED ORDER — WARFARIN SODIUM 4 MG PO TABS: 2.0000 mg | ORAL_TABLET | Freq: Every day | ORAL | Status: DC

## 2015-11-08 MED ORDER — OXYCODONE-ACETAMINOPHEN 5-325 MG PO TABS: 1.0000 | ORAL_TABLET | Freq: Four times a day (QID) | ORAL | 0 refills | Status: DC | PRN

## 2015-11-08 MED ORDER — PROMETHAZINE HCL 25 MG/ML IJ SOLN: 6.2500 mg | INTRAMUSCULAR | Status: DC | PRN

## 2015-11-08 MED ORDER — SODIUM CHLORIDE 0.9 % IV SOLN
INTRAVENOUS | Status: DC
  Administered 2015-11-08: 25 mL/h via INTRAVENOUS
  Administered 2015-11-08: 09:00:00 via INTRAVENOUS

## 2015-11-08 MED ORDER — PROPOFOL 500 MG/50ML IV EMUL
INTRAVENOUS | Status: DC | PRN
  Administered 2015-11-08: 75 ug/kg/min via INTRAVENOUS

## 2015-11-08 MED ORDER — FENTANYL CITRATE (PF) 100 MCG/2ML IJ SOLN
INTRAMUSCULAR | Status: DC | PRN
  Administered 2015-11-08: 75 ug via INTRAVENOUS
  Administered 2015-11-08: 25 ug via INTRAVENOUS

## 2015-11-08 MED ORDER — SEVELAMER CARBONATE 800 MG PO TABS: 800.0000 mg | ORAL_TABLET | Freq: Three times a day (TID) | ORAL | 0 refills | Status: DC

## 2015-11-08 MED ORDER — LIDOCAINE-EPINEPHRINE (PF) 1 %-1:200000 IJ SOLN
INTRAMUSCULAR | Status: DC | PRN
  Administered 2015-11-08: 7 mL via INTRADERMAL

## 2015-11-08 MED ORDER — FUROSEMIDE 80 MG PO TABS: 80.0000 mg | ORAL_TABLET | ORAL | Status: DC

## 2015-11-08 MED ORDER — SODIUM CHLORIDE 0.9 % IV SOLN: INTRAVENOUS | Status: DC

## 2015-11-08 SURGICAL SUPPLY — 28 items
ARMBAND PINK RESTRICT EXTREMIT (MISCELLANEOUS) ×4 IMPLANT
BENZOIN TINCTURE PRP APPL 2/3 (GAUZE/BANDAGES/DRESSINGS) ×2 IMPLANT
CANISTER SUCTION 2500CC (MISCELLANEOUS) ×2 IMPLANT
CANNULA VESSEL 3MM 2 BLNT TIP (CANNULA) ×2 IMPLANT
CLIP LIGATING EXTRA MED SLVR (CLIP) ×2 IMPLANT
CLIP LIGATING EXTRA SM BLUE (MISCELLANEOUS) ×2 IMPLANT
CLSR STERI-STRIP ANTIMIC 1/2X4 (GAUZE/BANDAGES/DRESSINGS) ×2 IMPLANT
COVER PROBE W GEL 5X96 (DRAPES) ×2 IMPLANT
DECANTER SPIKE VIAL GLASS SM (MISCELLANEOUS) ×2 IMPLANT
ELECT REM PT RETURN 9FT ADLT (ELECTROSURGICAL) ×2
ELECTRODE REM PT RTRN 9FT ADLT (ELECTROSURGICAL) ×1 IMPLANT
GAUZE SPONGE 4X4 12PLY STRL (GAUZE/BANDAGES/DRESSINGS) ×2 IMPLANT
GEL ULTRASOUND 20GR AQUASONIC (MISCELLANEOUS) IMPLANT
GLOVE SS BIOGEL STRL SZ 7.5 (GLOVE) ×1 IMPLANT
GLOVE SUPERSENSE BIOGEL SZ 7.5 (GLOVE) ×1
GOWN STRL REUS W/ TWL LRG LVL3 (GOWN DISPOSABLE) ×3 IMPLANT
GOWN STRL REUS W/TWL LRG LVL3 (GOWN DISPOSABLE) ×6
KIT BASIN OR (CUSTOM PROCEDURE TRAY) ×2 IMPLANT
KIT ROOM TURNOVER OR (KITS) ×2 IMPLANT
NS IRRIG 1000ML POUR BTL (IV SOLUTION) ×2 IMPLANT
PACK CV ACCESS (CUSTOM PROCEDURE TRAY) ×2 IMPLANT
PAD ARMBOARD 7.5X6 YLW CONV (MISCELLANEOUS) ×4 IMPLANT
STRIP CLOSURE SKIN 1/2X4 (GAUZE/BANDAGES/DRESSINGS) ×2 IMPLANT
SUT PROLENE 6 0 CC (SUTURE) ×2 IMPLANT
SUT VIC AB 3-0 SH 27 (SUTURE) ×1
SUT VIC AB 3-0 SH 27X BRD (SUTURE) ×1 IMPLANT
UNDERPAD 30X30 (UNDERPADS AND DIAPERS) ×2 IMPLANT
WATER STERILE IRR 1000ML POUR (IV SOLUTION) ×2 IMPLANT

## 2015-11-08 NOTE — Anesthesia Procedure Notes (Signed)
Procedure Name: MAC Date/Time: 11/08/2015 10:31 AM Performed by: Eligha Bridegroom Pre-anesthesia Checklist: Patient identified, Emergency Drugs available, Suction available, Patient being monitored and Timeout performed Patient Re-evaluated:Patient Re-evaluated prior to inductionOxygen Delivery Method: Nasal cannula Intubation Type: IV induction

## 2015-11-08 NOTE — Anesthesia Preprocedure Evaluation (Signed)
Anesthesia Evaluation  Patient identified by MRN, date of birth, ID band Patient awake    Reviewed: Allergy & Precautions, NPO status , Patient's Chart, lab work & pertinent test results  Airway Mallampati: II  TM Distance: >3 FB Neck ROM: Full    Dental no notable dental hx.    Pulmonary neg pulmonary ROS, former smoker,    Pulmonary exam normal breath sounds clear to auscultation       Cardiovascular hypertension, + CAD, + CABG and +CHF  Normal cardiovascular exam+ dysrhythmias + pacemaker  Rhythm:Regular Rate:Normal     Neuro/Psych negative neurological ROS  negative psych ROS   GI/Hepatic negative GI ROS, Neg liver ROS,   Endo/Other  diabetes  Renal/GU ESRFRenal disease  negative genitourinary   Musculoskeletal negative musculoskeletal ROS (+)   Abdominal   Peds negative pediatric ROS (+)  Hematology  (+) anemia ,   Anesthesia Other Findings   Reproductive/Obstetrics negative OB ROS                             Anesthesia Physical Anesthesia Plan  ASA: III  Anesthesia Plan: MAC   Post-op Pain Management:    Induction: Intravenous  Airway Management Planned: Simple Face Mask  Additional Equipment:   Intra-op Plan:   Post-operative Plan:   Informed Consent: I have reviewed the patients History and Physical, chart, labs and discussed the procedure including the risks, benefits and alternatives for the proposed anesthesia with the patient or authorized representative who has indicated his/her understanding and acceptance.   Dental advisory given  Plan Discussed with: CRNA and Surgeon  Anesthesia Plan Comments:         Anesthesia Quick Evaluation

## 2015-11-08 NOTE — Op Note (Signed)
    OPERATIVE REPORT  DATE OF SURGERY: 11/08/2015  PATIENT: Christopher [name], 82 y.o. male MRN: [id number]  DOB: September 24, 1926  PRE-OPERATIVE DIAGNOSIS: Chronic renal insufficiency  POST-OPERATIVE DIAGNOSIS:  Same  PROCEDURE: Left radiocephalic AV fistula creation  SURGEON:  Curt Jews, M.D.  PHYSICIAN ASSISTANT: Silva Bandy PA-C  ANESTHESIA:  Local with sedation  EBL: Minimal ml  Total I/O In: 300 [I.V.:300] Out: -   BLOOD ADMINISTERED: None  DRAINS: None  SPECIMEN: None  COUNTS CORRECT:  YES  PLAN OF CARE: PACU   PATIENT DISPOSITION:  PACU - hemodynamically stable  PROCEDURE DETAILS: Patient was taken to the operative placed supine position where the area of the films from [facility] sterile fashion. SonoSite ultrasound was used to visualize the cephalic vein. The vein was of good caliber throughout its course. Incision was made using local anesthesia between the level of the cephalic vein and radial artery at the wrist. The vein was ligated distally and divided. The vein was gently dilated and was of good caliber. The vein approximation with the radial artery. The artery was of good caliber and had minimal atherosclerotic change. The artery was occluded proximally and distally was opened with 11 blade some long-standing with Potts scissors. The vein was cut to appropriate length and was spatulated and sewn end-to-side to the artery with a running 6-0 Prolene suture. Clamps removed and excellent thrill was noted. The wound irrigated with saline. Hemostasis tablet cautery. Wounds were closed with 3-0 Vicryl in the subcutaneous and subcuticular benzoin insertion for applied. Patient was transferred to the recovery room in stable condition   Curt Jews, M.D. 11/08/2015 11:55 AM

## 2015-11-08 NOTE — Anesthesia Postprocedure Evaluation (Signed)
Anesthesia Post Note  Patient: Christopher [name]  Procedure(s) Performed: Procedure(s) (LRB): ARTERIOVENOUS (AV) FISTULA CREATION (Left)  Patient location during evaluation: PACU Anesthesia Type: MAC Level of consciousness: awake and alert Pain management: pain level controlled Vital Signs Assessment: post-procedure vital signs reviewed and stable Respiratory status: spontaneous breathing, nonlabored ventilation, respiratory function stable and patient connected to nasal cannula oxygen Cardiovascular status: blood pressure returned to baseline and stable Postop Assessment: no signs of nausea or vomiting Anesthetic complications: no    Last Vitals:  Vitals:   11/08/15 0858 11/08/15 1150  BP: 138/76 114/74  Pulse: 70 77  Resp: 18 (!) 8  Temp: 36.6 C 36.6 C    Last Pain:  Vitals:   11/08/15 1150  TempSrc:   PainSc: 0-No pain                 Leetta Hendriks S

## 2015-11-08 NOTE — H&P (Signed)
Progress Notes Encounter Date: 09/08/2015 Serafina Mitchell, MD  Vascular Surgery                                       [facility]  Patient name: Christopher [name]           MRN: [id number]                  DOB: March 23, 1926                    Sex: male  REFERRING PHYSICIAN: Dr. Mercy Moore  REASON FOR CONSULT: dialysis acces  HPI: Christopher [name] is a 82 y.o. male, who is her today for evaluation of dialysis access.  He has stage IV renal insufficiency.  This is secondary to diabetes and hypertension as well as advanced age.  He is right-handed.  He is scheduled to have hip replacement surgery in August and the concern is that his surgery exacerbates his renal disease and therefore access placement has been requested.  He does take Coumadin for atrial fibrillation.  He has a dual-chamber pacemaker.  His heart disease is followed by Dr. Wynonia Lawman.  He is status post CABG in 2003       Past Medical History  Diagnosis Date  . Lumbar disc disease   . BPH (benign prostatic hyperplasia)   . HTN (hypertension)   . Sinus node dysfunction (HCC)     symptomatic bradycardia  . Pacemaker     Medtronic-dual-chamber-DOI 2012  . CAD (coronary artery disease)     followed by Dr. Wynonia Lawman  . History of skin cancer   . Chronic kidney disease     kidneys function at "20 %" followed by Dr. Mercy Moore  . Prostate cancer ([place])   . Lumbar disc disease 1992  . Atrial fibrillation ([place]) 08/07/2011    CHADS score  2 CHADS2 VASC score 4  Onset winter of 2013 Cardioversion May 2013 Reversion to a fib 3/14 amiodarone begun 07/02/12  Repeat cardioversion Jul 16, 2012    . CAD (coronary artery disease) 03/06/2010    CABG with LIMA to LAD, SVG to OM-OM2  05/05/01 Dr. Roxan Hockey  Cardiac Cath 12/2009 60% left main, occluded LAD, Widely patent SVG to OM1 and 2, patent LIMA, widely patent RCA    . Cardiac pacemaker in situ     07/26/10  Inserted for chronotropic  incompetence and dyspnea  RA lead  Medtronic 5076  serial number [id number]   RV lead  Medtronic 5076  serial number [id number] Medtronic Adapta RL pulse generator, SN  [id number]    . Hypertensive heart disease without CHF           Family History  Problem Relation Age of Onset  . Cancer Mother     breast  . Cancer Father     prostate, esophagus    SOCIAL HISTORY: Social History        Social History  . Marital Status: Married    Spouse Name: N/A  . Number of Children: 3  . Years of Education: N/A       Occupational History  . retired         Social History Main Topics  . Smoking status: Former Smoker -- 0.75 packs/day for 24 years    Types: Cigarettes    Quit date: 03/12/1968  . Smokeless tobacco: Former  User    Types: Snuff, Chew    Quit date: 03/11/1968  . Alcohol Use: No  . Drug Use: No  . Sexual Activity: Not Currently       Other Topics Concern  . Not on file   Social History Narrative         Allergies  Allergen Reactions  . Crestor [Rosuvastatin] Other (See Comments)    Aches in joint   . Lipitor [Atorvastatin] Other (See Comments)    Joint aching  . Penicillins Rash and Other (See Comments)    Has patient had a PCN reaction causing immediate rash, facial/tongue/throat swelling, SOB or lightheadedness with hypotension: yes Has patient had a PCN reaction causing severe rash involving mucus membranes or skin necrosis: no Has patient had a PCN reaction that required hospitalization no Has patient had a PCN reaction occurring within the last 10 years: no If all of the above answers are "NO", then may proceed with Cephalosporin use.  . Tramadol Nausea Only  . Vicodin [Hydrocodone-Acetaminophen] Anxiety    "Made me nervous"          Current Outpatient Prescriptions  Medication Sig Dispense Refill  . acetaminophen (TYLENOL) 325 MG tablet Take 2 tablets (650 mg total) by mouth every 6 (six) hours as needed for  mild pain (or Fever >/= 101). 30 tablet 0  . amiodarone (PACERONE) 200 MG tablet Take 1 tablet (200 mg total) by mouth every morning.    Marland Kitchen atenolol (TENORMIN) 25 MG tablet Take 25 mg by mouth every morning.     . Calcium Carb-Cholecalciferol (CALTRATE 600+D3 SOFT PO) Take 1 tablet by mouth 2 (two) times daily.     . Darbepoetin Alfa (ARANESP) 100 MCG/0.5ML SOSY injection Inject 100 mcg into the skin every 21 ( twenty-one) days.    Marland Kitchen dextromethorphan-guaiFENesin (MUCINEX DM) 30-600 MG 12hr tablet Take 1 tablet by mouth 2 (two) times daily as needed for cough. 45 tablet 0  . folic acid (FOLVITE) A999333 MCG tablet Take 400 mcg by mouth daily.      . furosemide (LASIX) 80 MG tablet Take 1 tablet (80 mg total) by mouth 2 (two) times daily. (Patient taking differently: Take 80-160 mg by mouth 2 (two) times daily. Takes two tablets in the morning and one tablet at dinner.)    . glimepiride (AMARYL) 1 MG tablet Take 1 tablet (1 mg total) by mouth daily before breakfast. 30 tablet 1  . Glucosamine HCl 1500 MG TABS Take 1,500 mg by mouth 2 (two) times daily.     . Horse Chestnut 300 MG CAPS Take 300 mg by mouth daily.     . Multiple Vitamin (MULTIVITAMIN WITH MINERALS) TABS tablet Take 1 tablet by mouth daily.    . Omega-3 Fatty Acids (FISH OIL) 1200 MG CAPS Take 1,200 mg by mouth daily.    . Polyethylene Glycol 3350 (MIRALAX PO) Take 17 g by mouth daily.     . sevelamer carbonate (RENVELA) 800 MG tablet Take 2 tablets (1,600 mg total) by mouth 3 (three) times daily with meals. (Patient taking differently: Take 800 mg by mouth 3 (three) times daily with meals. ) 180 tablet 12  . tamsulosin (FLOMAX) 0.4 MG CAPS capsule Take 0.4 mg by mouth daily.   3  . warfarin (COUMADIN) 4 MG tablet Take 0.5-1 tablets (2-4 mg total) by mouth daily. Increase to 4 mg daily (Patient taking differently: Take 4 mg by mouth daily. )     No current facility-administered medications  for this visit.     REVIEW OF SYSTEMS:  [X]  denotes positive finding, [ ]  denotes negative finding Cardiac  Comments:  Chest pain or chest pressure:    Shortness of breath upon exertion: x   Short of breath when lying flat: x   Irregular heart rhythm: x       Vascular    Pain in calf, thigh, or hip brought on by ambulation:    Pain in feet at night that wakes you up from your sleep:     Blood clot in your veins:    Leg swelling:  x       Pulmonary    Oxygen at home:    Productive cough:     Wheezing:  x       Neurologic    Sudden weakness in arms or legs:     Sudden numbness in arms or legs:     Sudden onset of difficulty speaking or slurred speech:    Temporary loss of vision in one eye:     Problems with dizziness:         Gastrointestinal    Blood in stool:     Vomited blood:         Genitourinary    Burning when urinating:     Blood in urine:        Psychiatric    Major depression:         Hematologic    Bleeding problems:    Problems with blood clotting too easily:        Skin    Rashes or ulcers:        Constitutional    Fever or chills:      PHYSICAL EXAM:    Filed Vitals:   09/08/15 0926  BP: 113/70  Pulse: 71  Temp: 96.9 F (36.1 C)  Resp: 18  Height: 6\' 1"  (1.854 m)  Weight: 234 lb (106.142 kg)  SpO2: 96%    GENERAL: The patient is a well-nourished male, in no acute distress. The vital signs are documented above. CARDIAC: There is a regular rate and rhythm.  VASCULAR: Palpable left radial and brachial pulse PULMONARY: There is good air exchange bilaterally without wheezing or rales. MUSCULOSKELETAL: There are no major deformities or cyanosis. NEUROLOGIC: No focal weakness or paresthesias are detected. SKIN: There are no ulcers or rashes noted. PSYCHIATRIC: The patient has a normal affect.  DATA:  I have reviewed his vascular lab studies. Arterial:  Allen's test was decreased with ulnar compression on the left. Venous: Vein mapping shows an adequate cephalic vein on the left  MEDICAL ISSUES: I discussed proceeding with a left arm fistula.  I suspect this will end up being a brachiocephalic fistula but I would evaluate the cephalic vein in the operating room to determine if he would be a candidate for radiocephalic fistula.  I discussed the risks and benefits of the procedure with the patient and his wife, including but not limited to the risk of premature occlusion, the need for future interventions, and the risk of steal syndrome.  He will need to be off of his Coumadin for 5 days prior to his operation.  This needs to be done in advance of his hip replacement surgery, therefore it will need to be scheduled within the next few weeks.  We are looking for a date to get this accommodated.  He understands that one of my partners may end up doing his surgery in order to accommodate  the timeframe.   Annamarie Major, MD [facility] [phone] Pager [phone]        Addendum:  The patient has been re-examined and re-evaluated.  The patient's history and physical has been reviewed and is unchanged.    Christopher [name] is a 82 y.o. male is being admitted with Stage IV Chronic Kidney Disease N18.4. All the risks, benefits and other treatment options have been discussed with the patient. The patient has consented to proceed with Procedure(s): ARTERIOVENOUS (AV) FISTULA CREATION as a surgical intervention.  Curt Jews 11/08/2015 8:09 AM Vascular and Vein Surgery

## 2015-11-08 NOTE — Transfer of Care (Signed)
Immediate Anesthesia Transfer of Care Note  Patient: Christopher [name]  Procedure(s) Performed: Procedure(s): ARTERIOVENOUS (AV) FISTULA CREATION (Left)  Patient Location: PACU  Anesthesia Type:MAC  Level of Consciousness: awake, alert  and oriented  Airway & Oxygen Therapy: Patient Spontanous Breathing and Patient connected to nasal cannula oxygen  Post-op Assessment: Report given to RN and Post -op Vital signs reviewed and stable  Post vital signs: Reviewed and stable  Last Vitals:  Vitals:   11/08/15 0858 11/08/15 1150  BP: 138/76 114/74  Pulse: 70 77  Resp: 18 (!) 8  Temp: 36.6 C 36.6 C    Last Pain:  Vitals:   11/08/15 1150  TempSrc:   PainSc: (P) 0-No pain         Complications: No apparent anesthesia complications

## 2015-11-09 ENCOUNTER — Encounter (HOSPITAL_COMMUNITY): Payer: Self-pay | Admitting: Vascular Surgery

## 2015-11-09 ENCOUNTER — Telehealth: Payer: Self-pay | Admitting: Vascular Surgery

## 2015-11-09 DIAGNOSIS — I5032 Chronic diastolic (congestive) heart failure: Secondary | ICD-10-CM | POA: Diagnosis not present

## 2015-11-09 DIAGNOSIS — I480 Paroxysmal atrial fibrillation: Secondary | ICD-10-CM | POA: Diagnosis not present

## 2015-11-09 DIAGNOSIS — E785 Hyperlipidemia, unspecified: Secondary | ICD-10-CM | POA: Diagnosis not present

## 2015-11-09 DIAGNOSIS — I119 Hypertensive heart disease without heart failure: Secondary | ICD-10-CM | POA: Diagnosis not present

## 2015-11-09 DIAGNOSIS — R0602 Shortness of breath: Secondary | ICD-10-CM | POA: Diagnosis not present

## 2015-11-09 DIAGNOSIS — I2510 Atherosclerotic heart disease of native coronary artery without angina pectoris: Secondary | ICD-10-CM | POA: Diagnosis not present

## 2015-11-09 DIAGNOSIS — Z7901 Long term (current) use of anticoagulants: Secondary | ICD-10-CM | POA: Diagnosis not present

## 2015-11-09 DIAGNOSIS — E1122 Type 2 diabetes mellitus with diabetic chronic kidney disease: Secondary | ICD-10-CM | POA: Diagnosis not present

## 2015-11-09 DIAGNOSIS — Z950 Presence of cardiac pacemaker: Secondary | ICD-10-CM | POA: Diagnosis not present

## 2015-11-09 DIAGNOSIS — Z951 Presence of aortocoronary bypass graft: Secondary | ICD-10-CM | POA: Diagnosis not present

## 2015-11-09 DIAGNOSIS — N184 Chronic kidney disease, stage 4 (severe): Secondary | ICD-10-CM | POA: Diagnosis not present

## 2015-11-09 NOTE — Telephone Encounter (Signed)
Sched lab 9/25 at 12:00 and MD 9/26 at 9:45. Spoke to pt to inform them of appt.

## 2015-11-09 NOTE — Telephone Encounter (Signed)
-----   Message from Mena Goes, RN sent at 11/08/2015 12:46 PM EDT ----- Regarding: schedule postop w/ duplex    ----- Message ----- From: Alvia Grove, PA-C Sent: 11/08/2015  11:40 AM To: Vvs Charge Pool  S/p left radial-cephalic AVF [id number]  F/u in 4 weeks with Dr. Donnetta Hutching and duplex  Thanks Maudie Mercury

## 2015-11-15 DIAGNOSIS — E1122 Type 2 diabetes mellitus with diabetic chronic kidney disease: Secondary | ICD-10-CM | POA: Diagnosis not present

## 2015-11-15 DIAGNOSIS — Z23 Encounter for immunization: Secondary | ICD-10-CM | POA: Diagnosis not present

## 2015-11-15 DIAGNOSIS — I509 Heart failure, unspecified: Secondary | ICD-10-CM | POA: Diagnosis not present

## 2015-11-15 DIAGNOSIS — S72009D Fracture of unspecified part of neck of unspecified femur, subsequent encounter for closed fracture with routine healing: Secondary | ICD-10-CM | POA: Diagnosis not present

## 2015-11-15 DIAGNOSIS — N184 Chronic kidney disease, stage 4 (severe): Secondary | ICD-10-CM | POA: Diagnosis not present

## 2015-11-15 DIAGNOSIS — Z6831 Body mass index (BMI) 31.0-31.9, adult: Secondary | ICD-10-CM | POA: Diagnosis not present

## 2015-11-17 DIAGNOSIS — H52203 Unspecified astigmatism, bilateral: Secondary | ICD-10-CM | POA: Diagnosis not present

## 2015-11-17 DIAGNOSIS — E119 Type 2 diabetes mellitus without complications: Secondary | ICD-10-CM | POA: Diagnosis not present

## 2015-11-17 DIAGNOSIS — H04123 Dry eye syndrome of bilateral lacrimal glands: Secondary | ICD-10-CM | POA: Diagnosis not present

## 2015-11-17 DIAGNOSIS — H43813 Vitreous degeneration, bilateral: Secondary | ICD-10-CM | POA: Diagnosis not present

## 2015-11-22 ENCOUNTER — Inpatient Hospital Stay (HOSPITAL_COMMUNITY): Admission: RE | Admit: 2015-11-22 | Payer: Medicare Other | Source: Ambulatory Visit

## 2015-11-23 DIAGNOSIS — I480 Paroxysmal atrial fibrillation: Secondary | ICD-10-CM | POA: Diagnosis not present

## 2015-11-23 DIAGNOSIS — R0602 Shortness of breath: Secondary | ICD-10-CM | POA: Diagnosis not present

## 2015-11-23 DIAGNOSIS — Z951 Presence of aortocoronary bypass graft: Secondary | ICD-10-CM | POA: Diagnosis not present

## 2015-11-23 DIAGNOSIS — E1122 Type 2 diabetes mellitus with diabetic chronic kidney disease: Secondary | ICD-10-CM | POA: Diagnosis not present

## 2015-11-23 DIAGNOSIS — I119 Hypertensive heart disease without heart failure: Secondary | ICD-10-CM | POA: Diagnosis not present

## 2015-11-23 DIAGNOSIS — Z7901 Long term (current) use of anticoagulants: Secondary | ICD-10-CM | POA: Diagnosis not present

## 2015-11-23 DIAGNOSIS — E785 Hyperlipidemia, unspecified: Secondary | ICD-10-CM | POA: Diagnosis not present

## 2015-11-23 DIAGNOSIS — Z950 Presence of cardiac pacemaker: Secondary | ICD-10-CM | POA: Diagnosis not present

## 2015-11-23 DIAGNOSIS — I2510 Atherosclerotic heart disease of native coronary artery without angina pectoris: Secondary | ICD-10-CM | POA: Diagnosis not present

## 2015-11-23 DIAGNOSIS — N184 Chronic kidney disease, stage 4 (severe): Secondary | ICD-10-CM | POA: Diagnosis not present

## 2015-11-23 DIAGNOSIS — I5032 Chronic diastolic (congestive) heart failure: Secondary | ICD-10-CM | POA: Diagnosis not present

## 2015-11-28 ENCOUNTER — Encounter: Payer: Self-pay | Admitting: Vascular Surgery

## 2015-11-30 ENCOUNTER — Other Ambulatory Visit (HOSPITAL_COMMUNITY): Payer: Self-pay | Admitting: *Deleted

## 2015-12-01 ENCOUNTER — Encounter (HOSPITAL_COMMUNITY)
Admission: RE | Admit: 2015-12-01 | Discharge: 2015-12-01 | Disposition: A | Discharge status: Home / Self Care | Payer: Medicare Other | Source: Ambulatory Visit | Attending: Nephrology | Admitting: Nephrology

## 2015-12-01 DIAGNOSIS — D638 Anemia in other chronic diseases classified elsewhere: Secondary | ICD-10-CM | POA: Diagnosis not present

## 2015-12-01 DIAGNOSIS — N184 Chronic kidney disease, stage 4 (severe): Secondary | ICD-10-CM | POA: Diagnosis not present

## 2015-12-01 LAB — POCT HEMOGLOBIN-HEMACUE: Hemoglobin: 10.3 g/dL — ABNORMAL LOW (ref 13.0–17.0)

## 2015-12-01 MED ORDER — DARBEPOETIN ALFA 100 MCG/0.5ML IJ SOSY
100.0000 ug | PREFILLED_SYRINGE | INTRAMUSCULAR | Status: DC
  Administered 2015-12-01: 100 ug via SUBCUTANEOUS

## 2015-12-01 MED ORDER — DARBEPOETIN ALFA 100 MCG/0.5ML IJ SOSY
PREFILLED_SYRINGE | INTRAMUSCULAR | Status: AC
  Administered 2015-12-01: 100 ug via SUBCUTANEOUS
  Filled 2015-12-01: qty 0.5

## 2015-12-04 ENCOUNTER — Ambulatory Visit (HOSPITAL_COMMUNITY)
Admission: RE | Admit: 2015-12-04 | Discharge: 2015-12-04 | Disposition: A | Discharge status: Home / Self Care | Payer: Medicare Other | Source: Ambulatory Visit | Attending: Vascular Surgery | Admitting: Vascular Surgery

## 2015-12-04 DIAGNOSIS — N186 End stage renal disease: Secondary | ICD-10-CM | POA: Diagnosis not present

## 2015-12-04 DIAGNOSIS — Z4931 Encounter for adequacy testing for hemodialysis: Secondary | ICD-10-CM | POA: Diagnosis not present

## 2015-12-05 ENCOUNTER — Ambulatory Visit (INDEPENDENT_AMBULATORY_CARE_PROVIDER_SITE_OTHER): Payer: Medicare Other | Admitting: Vascular Surgery

## 2015-12-05 ENCOUNTER — Encounter: Payer: Self-pay | Admitting: Vascular Surgery

## 2015-12-05 VITALS — BP 131/81 | HR 74 | Temp 97.0°F | Resp 18 | Ht 74.0 in | Wt 235.0 lb

## 2015-12-05 DIAGNOSIS — N184 Chronic kidney disease, stage 4 (severe): Secondary | ICD-10-CM

## 2015-12-05 NOTE — Progress Notes (Signed)
Patient name: Christopher [name] MRN: [id number] DOB: 09-15-1926 Sex: male  REASON FOR VISIT: Follow-up left radiocephalic fistula creation by myself on 11/08/2015  HPI: Christopher [name] is a 82 y.o. male here today for follow-up. He reports that he is not seen Dr. Mercy Moore back but has not been told that he has any change in his renal function. His main disability continues to be a hip fracture and he walks with a rolling walker for this. Here today with his wife. They plan a trip to the beach for 2 weeks early October for this since they have been able to do this for several years. He has minimal discomfort associated with his wrist incision.  Current Outpatient Prescriptions  Medication Sig Dispense Refill  . amiodarone (PACERONE) 200 MG tablet Take 1 tablet (200 mg total) by mouth every morning.    Marland Kitchen atenolol (TENORMIN) 25 MG tablet Take 25 mg by mouth every morning.     . Calcium Carb-Cholecalciferol (CALTRATE 600+D3 SOFT PO) Take 1 tablet by mouth 2 (two) times daily.     . cholecalciferol (VITAMIN D) 1000 units tablet Take 1,000 Units by mouth daily.    . Darbepoetin Alfa (ARANESP) 100 MCG/0.5ML SOSY injection Inject 100 mcg into the skin every 21 ( twenty-one) days.    . folic acid (FOLVITE) A999333 MCG tablet Take 400 mcg by mouth daily.      . furosemide (LASIX) 80 MG tablet Take 1-2 tablets (80-160 mg total) by mouth See admin instructions. Takes two tablets (160 mg) in the morning and one tablet (80 mg) at dinner.    Marland Kitchen glimepiride (AMARYL) 1 MG tablet Take 1 tablet (1 mg total) by mouth daily before breakfast. 30 tablet 1  . Glucosamine HCl 1500 MG TABS Take 1,500 mg by mouth 2 (two) times daily.     . Horse Chestnut 300 MG CAPS Take 300 mg by mouth daily.     . Multiple Vitamin (MULTIVITAMIN WITH MINERALS) TABS tablet Take 1 tablet by mouth daily.    . nitroGLYCERIN (NITROSTAT) 0.4 MG SL tablet Place 0.4 mg under the tongue every 5 (five) minutes as needed  for chest pain.    . Omega-3 Fatty Acids (FISH OIL) 1200 MG CAPS Take 1,200 mg by mouth daily.    Marland Kitchen oxyCODONE-acetaminophen (PERCOCET/ROXICET) 5-325 MG tablet Take 1 tablet by mouth every 6 (six) hours as needed. 6 tablet 0  . Polyethylene Glycol 3350 (MIRALAX PO) Take 17 g by mouth daily.     . sevelamer carbonate (RENVELA) 800 MG tablet Take 1 tablet (800 mg total) by mouth 3 (three) times daily with meals. 90 tablet 0  . warfarin (COUMADIN) 4 MG tablet Take 0.5-1 tablets (2-4 mg total) by mouth daily. 2 mg on Monday, Wednesday, and Friday.  4 mg all other days.     No current facility-administered medications for this visit.      PHYSICAL EXAM: Vitals:   12/05/15 0939  BP: 131/81  Pulse: 74  Resp: 18  Temp: 97 F (36.1 C)  TempSrc: Oral  SpO2: 96%  Weight: 106.6 kg (235 lb)  Height: 6\' 2"  (1.88 m)    GENERAL: The patient is a well-nourished male, in no acute distress. The vital signs are documented above. On physical exam his wrist incision is well-healed. Unfortunately he has no thrill or bruit present and is radiocephalic fistula  Duplex of this reveals occlusion of his radiocephalic fistula.  MEDICAL ISSUES: Discussed options with the patient. He did  have felt to be adequate size for radiocephalic attempt. The vein is somewhat larger at the antecubital space and is patent to the upper arm. I have recommended attempt at brachiocephalic fistula. Explain the potential for failure of this as well. If this fails would consider waiting for eminent need for placing a right arm access. He is to see Dr. Mercy Moore in mid-October. Will defer surgery until he is spoken with Dr. Mercy Moore did assure that would still upper for having fistula placed. I do feel he has a good chance of having success with an upper arm fistula.   [name], MD FACS [facility] Tel [phone] Pager [phone]

## 2015-12-07 DIAGNOSIS — I119 Hypertensive heart disease without heart failure: Secondary | ICD-10-CM | POA: Diagnosis not present

## 2015-12-07 DIAGNOSIS — Z951 Presence of aortocoronary bypass graft: Secondary | ICD-10-CM | POA: Diagnosis not present

## 2015-12-07 DIAGNOSIS — Z950 Presence of cardiac pacemaker: Secondary | ICD-10-CM | POA: Diagnosis not present

## 2015-12-07 DIAGNOSIS — N184 Chronic kidney disease, stage 4 (severe): Secondary | ICD-10-CM | POA: Diagnosis not present

## 2015-12-07 DIAGNOSIS — E1122 Type 2 diabetes mellitus with diabetic chronic kidney disease: Secondary | ICD-10-CM | POA: Diagnosis not present

## 2015-12-07 DIAGNOSIS — E785 Hyperlipidemia, unspecified: Secondary | ICD-10-CM | POA: Diagnosis not present

## 2015-12-07 DIAGNOSIS — R0602 Shortness of breath: Secondary | ICD-10-CM | POA: Diagnosis not present

## 2015-12-07 DIAGNOSIS — I2510 Atherosclerotic heart disease of native coronary artery without angina pectoris: Secondary | ICD-10-CM | POA: Diagnosis not present

## 2015-12-07 DIAGNOSIS — Z7901 Long term (current) use of anticoagulants: Secondary | ICD-10-CM | POA: Diagnosis not present

## 2015-12-07 DIAGNOSIS — I480 Paroxysmal atrial fibrillation: Secondary | ICD-10-CM | POA: Diagnosis not present

## 2015-12-07 DIAGNOSIS — I5032 Chronic diastolic (congestive) heart failure: Secondary | ICD-10-CM | POA: Diagnosis not present

## 2015-12-25 ENCOUNTER — Encounter (HOSPITAL_COMMUNITY)
Admission: RE | Admit: 2015-12-25 | Discharge: 2015-12-25 | Disposition: A | Discharge status: Home / Self Care | Payer: Medicare Other | Source: Ambulatory Visit | Attending: Nephrology | Admitting: Nephrology

## 2015-12-25 DIAGNOSIS — N184 Chronic kidney disease, stage 4 (severe): Secondary | ICD-10-CM | POA: Diagnosis not present

## 2015-12-25 DIAGNOSIS — D638 Anemia in other chronic diseases classified elsewhere: Secondary | ICD-10-CM | POA: Insufficient documentation

## 2015-12-25 LAB — IRON AND TIBC
IRON: 124 ug/dL (ref 45–182)
Saturation Ratios: 39 % (ref 17.9–39.5)
TIBC: 321 ug/dL (ref 250–450)
UIBC: 197 ug/dL

## 2015-12-25 LAB — FERRITIN: Ferritin: 741 ng/mL — ABNORMAL HIGH (ref 24–336)

## 2015-12-25 LAB — POCT HEMOGLOBIN-HEMACUE: HEMOGLOBIN: 10.7 g/dL — AB (ref 13.0–17.0)

## 2015-12-25 MED ORDER — DARBEPOETIN ALFA 100 MCG/0.5ML IJ SOSY
PREFILLED_SYRINGE | INTRAMUSCULAR | Status: AC
  Administered 2015-12-25: 100 ug via SUBCUTANEOUS
  Filled 2015-12-25: qty 0.5

## 2015-12-25 MED ORDER — DARBEPOETIN ALFA 100 MCG/0.5ML IJ SOSY
100.0000 ug | PREFILLED_SYRINGE | INTRAMUSCULAR | Status: DC
  Administered 2015-12-25: 100 ug via SUBCUTANEOUS

## 2015-12-27 DIAGNOSIS — Z6832 Body mass index (BMI) 32.0-32.9, adult: Secondary | ICD-10-CM | POA: Diagnosis not present

## 2015-12-27 DIAGNOSIS — I129 Hypertensive chronic kidney disease with stage 1 through stage 4 chronic kidney disease, or unspecified chronic kidney disease: Secondary | ICD-10-CM | POA: Diagnosis not present

## 2015-12-27 DIAGNOSIS — N184 Chronic kidney disease, stage 4 (severe): Secondary | ICD-10-CM | POA: Diagnosis not present

## 2015-12-27 DIAGNOSIS — D631 Anemia in chronic kidney disease: Secondary | ICD-10-CM | POA: Diagnosis not present

## 2015-12-27 DIAGNOSIS — Z6831 Body mass index (BMI) 31.0-31.9, adult: Secondary | ICD-10-CM | POA: Diagnosis not present

## 2015-12-27 DIAGNOSIS — E1129 Type 2 diabetes mellitus with other diabetic kidney complication: Secondary | ICD-10-CM | POA: Diagnosis not present

## 2015-12-28 ENCOUNTER — Other Ambulatory Visit: Payer: Self-pay

## 2016-01-01 ENCOUNTER — Encounter (HOSPITAL_COMMUNITY): Payer: Self-pay | Admitting: *Deleted

## 2016-01-02 MED ORDER — VANCOMYCIN HCL 10 G IV SOLR
1500.0000 mg | INTRAVENOUS | Status: AC
  Administered 2016-01-03: 1500 mg via INTRAVENOUS
  Filled 2016-01-02 (×2): qty 1500

## 2016-01-03 ENCOUNTER — Encounter (HOSPITAL_COMMUNITY): Payer: Self-pay | Admitting: *Deleted

## 2016-01-03 ENCOUNTER — Ambulatory Visit (HOSPITAL_COMMUNITY)
Admission: RE | Admit: 2016-01-03 | Discharge: 2016-01-03 | Disposition: A | Discharge status: Home / Self Care | Payer: Medicare Other | Attending: Vascular Surgery | Admitting: Vascular Surgery

## 2016-01-03 ENCOUNTER — Ambulatory Visit (HOSPITAL_COMMUNITY): Payer: Medicare Other | Admitting: Anesthesiology

## 2016-01-03 ENCOUNTER — Encounter (HOSPITAL_COMMUNITY)
Admission: RE | Disposition: A | Discharge status: Home / Self Care | Payer: Self-pay | Source: Home / Self Care | Attending: Vascular Surgery

## 2016-01-03 DIAGNOSIS — Z888 Allergy status to other drugs, medicaments and biological substances status: Secondary | ICD-10-CM | POA: Insufficient documentation

## 2016-01-03 DIAGNOSIS — D631 Anemia in chronic kidney disease: Secondary | ICD-10-CM | POA: Insufficient documentation

## 2016-01-03 DIAGNOSIS — Z87891 Personal history of nicotine dependence: Secondary | ICD-10-CM | POA: Insufficient documentation

## 2016-01-03 DIAGNOSIS — Z880 Allergy status to penicillin: Secondary | ICD-10-CM | POA: Insufficient documentation

## 2016-01-03 DIAGNOSIS — I2510 Atherosclerotic heart disease of native coronary artery without angina pectoris: Secondary | ICD-10-CM | POA: Insufficient documentation

## 2016-01-03 DIAGNOSIS — I132 Hypertensive heart and chronic kidney disease with heart failure and with stage 5 chronic kidney disease, or end stage renal disease: Secondary | ICD-10-CM | POA: Insufficient documentation

## 2016-01-03 DIAGNOSIS — Z950 Presence of cardiac pacemaker: Secondary | ICD-10-CM | POA: Diagnosis not present

## 2016-01-03 DIAGNOSIS — Z885 Allergy status to narcotic agent status: Secondary | ICD-10-CM | POA: Diagnosis not present

## 2016-01-03 DIAGNOSIS — I708 Atherosclerosis of other arteries: Secondary | ICD-10-CM | POA: Insufficient documentation

## 2016-01-03 DIAGNOSIS — I509 Heart failure, unspecified: Secondary | ICD-10-CM | POA: Insufficient documentation

## 2016-01-03 DIAGNOSIS — E1122 Type 2 diabetes mellitus with diabetic chronic kidney disease: Secondary | ICD-10-CM | POA: Diagnosis not present

## 2016-01-03 DIAGNOSIS — N186 End stage renal disease: Secondary | ICD-10-CM | POA: Insufficient documentation

## 2016-01-03 DIAGNOSIS — N184 Chronic kidney disease, stage 4 (severe): Secondary | ICD-10-CM | POA: Diagnosis not present

## 2016-01-03 DIAGNOSIS — Z951 Presence of aortocoronary bypass graft: Secondary | ICD-10-CM | POA: Insufficient documentation

## 2016-01-03 DIAGNOSIS — N185 Chronic kidney disease, stage 5: Secondary | ICD-10-CM | POA: Diagnosis not present

## 2016-01-03 HISTORY — PX: AV FISTULA PLACEMENT: SHX1204

## 2016-01-03 LAB — GLUCOSE, CAPILLARY: GLUCOSE-CAPILLARY: 103 mg/dL — AB (ref 65–99)

## 2016-01-03 LAB — POCT I-STAT 4, (NA,K, GLUC, HGB,HCT)
Glucose, Bld: 122 mg/dL — ABNORMAL HIGH (ref 65–99)
HCT: 36 % — ABNORMAL LOW (ref 39.0–52.0)
Hemoglobin: 12.2 g/dL — ABNORMAL LOW (ref 13.0–17.0)
Potassium: 5.2 mmol/L — ABNORMAL HIGH (ref 3.5–5.1)
SODIUM: 142 mmol/L (ref 135–145)

## 2016-01-03 LAB — APTT: APTT: 24 s (ref 24–36)

## 2016-01-03 LAB — PROTIME-INR
INR: 1.15
Prothrombin Time: 14.8 seconds (ref 11.4–15.2)

## 2016-01-03 SURGERY — ARTERIOVENOUS (AV) FISTULA CREATION
Anesthesia: Monitor Anesthesia Care | Site: Arm Upper | Laterality: Left

## 2016-01-03 MED ORDER — LIDOCAINE-EPINEPHRINE (PF) 1 %-1:200000 IJ SOLN
INTRAMUSCULAR | Status: DC | PRN
  Administered 2016-01-03: 30 mL

## 2016-01-03 MED ORDER — LIDOCAINE 2% (20 MG/ML) 5 ML SYRINGE
INTRAMUSCULAR | Status: DC | PRN
  Administered 2016-01-03: 40 mg via INTRAVENOUS

## 2016-01-03 MED ORDER — SODIUM CHLORIDE 0.9 % IV SOLN
INTRAVENOUS | Status: DC
  Administered 2016-01-03: 35 mL/h via INTRAVENOUS

## 2016-01-03 MED ORDER — FENTANYL CITRATE (PF) 100 MCG/2ML IJ SOLN
INTRAMUSCULAR | Status: AC
  Filled 2016-01-03: qty 2

## 2016-01-03 MED ORDER — SODIUM CHLORIDE 0.9 % IV SOLN
INTRAVENOUS | Status: DC | PRN
  Administered 2016-01-03: 500 mL

## 2016-01-03 MED ORDER — FENTANYL CITRATE (PF) 100 MCG/2ML IJ SOLN
25.0000 ug | INTRAMUSCULAR | Status: DC | PRN
Start: 2016-01-03 — End: 2016-01-03

## 2016-01-03 MED ORDER — MIDAZOLAM HCL 2 MG/2ML IJ SOLN
INTRAMUSCULAR | Status: AC
  Filled 2016-01-03: qty 2

## 2016-01-03 MED ORDER — CHLORHEXIDINE GLUCONATE CLOTH 2 % EX PADS: 6.0000 | MEDICATED_PAD | Freq: Once | CUTANEOUS | Status: DC

## 2016-01-03 MED ORDER — HYDROCODONE-ACETAMINOPHEN 5-325 MG PO TABS: 1.0000 | ORAL_TABLET | Freq: Four times a day (QID) | ORAL | 0 refills | Status: DC | PRN

## 2016-01-03 MED ORDER — WARFARIN SODIUM 4 MG PO TABS: 4.0000 mg | ORAL_TABLET | Freq: Every day | ORAL | Status: DC

## 2016-01-03 MED ORDER — 0.9 % SODIUM CHLORIDE (POUR BTL) OPTIME
TOPICAL | Status: DC | PRN
  Administered 2016-01-03: 1000 mL

## 2016-01-03 MED ORDER — PROPOFOL 500 MG/50ML IV EMUL
INTRAVENOUS | Status: DC | PRN
  Administered 2016-01-03: 50 ug/kg/min via INTRAVENOUS

## 2016-01-03 MED ORDER — PROPOFOL 10 MG/ML IV BOLUS
INTRAVENOUS | Status: DC | PRN
  Administered 2016-01-03: 20 mg via INTRAVENOUS
  Administered 2016-01-03: 10 mg via INTRAVENOUS

## 2016-01-03 MED ORDER — SODIUM CHLORIDE 0.9 % IV SOLN: INTRAVENOUS | Status: DC

## 2016-01-03 MED ORDER — FUROSEMIDE 80 MG PO TABS: 80.0000 mg | ORAL_TABLET | Freq: Two times a day (BID) | ORAL | Status: DC

## 2016-01-03 MED ORDER — MIDAZOLAM HCL 5 MG/5ML IJ SOLN
INTRAMUSCULAR | Status: DC | PRN
  Administered 2016-01-03: 1 mg via INTRAVENOUS

## 2016-01-03 MED ORDER — FENTANYL CITRATE (PF) 100 MCG/2ML IJ SOLN
INTRAMUSCULAR | Status: DC | PRN
  Administered 2016-01-03: 50 ug via INTRAVENOUS

## 2016-01-03 SURGICAL SUPPLY — 36 items
ARMBAND PINK RESTRICT EXTREMIT (MISCELLANEOUS) ×6 IMPLANT
BENZOIN TINCTURE PRP APPL 2/3 (GAUZE/BANDAGES/DRESSINGS) ×3 IMPLANT
CANISTER SUCTION 2500CC (MISCELLANEOUS) ×3 IMPLANT
CANNULA VESSEL 3MM 2 BLNT TIP (CANNULA) ×3 IMPLANT
CLIP LIGATING EXTRA MED SLVR (CLIP) ×3 IMPLANT
CLIP LIGATING EXTRA SM BLUE (MISCELLANEOUS) ×3 IMPLANT
CLOSURE WOUND 1/2 X4 (GAUZE/BANDAGES/DRESSINGS) ×1
COVER PROBE W GEL 5X96 (DRAPES) ×3 IMPLANT
DECANTER SPIKE VIAL GLASS SM (MISCELLANEOUS) ×3 IMPLANT
ELECT REM PT RETURN 9FT ADLT (ELECTROSURGICAL) ×3
ELECTRODE REM PT RTRN 9FT ADLT (ELECTROSURGICAL) ×1 IMPLANT
GAUZE SPONGE 4X4 12PLY STRL (GAUZE/BANDAGES/DRESSINGS) ×3 IMPLANT
GEL ULTRASOUND 20GR AQUASONIC (MISCELLANEOUS) IMPLANT
GLOVE BIO SURGEON STRL SZ 6.5 (GLOVE) ×8 IMPLANT
GLOVE BIO SURGEONS STRL SZ 6.5 (GLOVE) ×4
GLOVE BIOGEL PI IND STRL 6 (GLOVE) ×1 IMPLANT
GLOVE BIOGEL PI IND STRL 6.5 (GLOVE) ×2 IMPLANT
GLOVE BIOGEL PI INDICATOR 6 (GLOVE) ×2
GLOVE BIOGEL PI INDICATOR 6.5 (GLOVE) ×4
GLOVE SS BIOGEL STRL SZ 7.5 (GLOVE) ×1 IMPLANT
GLOVE SUPERSENSE BIOGEL SZ 7.5 (GLOVE) ×2
GOWN STRL REUS W/ TWL LRG LVL3 (GOWN DISPOSABLE) ×3 IMPLANT
GOWN STRL REUS W/TWL LRG LVL3 (GOWN DISPOSABLE) ×6
KIT BASIN OR (CUSTOM PROCEDURE TRAY) ×3 IMPLANT
KIT ROOM TURNOVER OR (KITS) ×3 IMPLANT
NS IRRIG 1000ML POUR BTL (IV SOLUTION) ×3 IMPLANT
PACK CV ACCESS (CUSTOM PROCEDURE TRAY) ×3 IMPLANT
PAD ARMBOARD 7.5X6 YLW CONV (MISCELLANEOUS) ×6 IMPLANT
SPONGE GAUZE 4X4 12PLY STER LF (GAUZE/BANDAGES/DRESSINGS) ×3 IMPLANT
STRIP CLOSURE SKIN 1/2X4 (GAUZE/BANDAGES/DRESSINGS) ×2 IMPLANT
SUT PROLENE 6 0 CC (SUTURE) ×3 IMPLANT
SUT VIC AB 3-0 SH 27 (SUTURE) ×2
SUT VIC AB 3-0 SH 27X BRD (SUTURE) ×1 IMPLANT
TAPE CLOTH SURG 4X10 WHT LF (GAUZE/BANDAGES/DRESSINGS) ×3 IMPLANT
UNDERPAD 30X30 (UNDERPADS AND DIAPERS) ×3 IMPLANT
WATER STERILE IRR 1000ML POUR (IV SOLUTION) ×3 IMPLANT

## 2016-01-03 NOTE — Anesthesia Procedure Notes (Signed)
Procedure Name: MAC Date/Time: 01/03/2016 1:00 PM Performed by: Melina Copa, Angelica Wix R Pre-anesthesia Checklist: Patient identified, Emergency Drugs available, Suction available, Patient being monitored and Timeout performed Patient Re-evaluated:Patient Re-evaluated prior to inductionOxygen Delivery Method: Nasal cannula Ventilation: Nasal airway inserted- appropriate to patient size Placement Confirmation: positive ETCO2 Dental Injury: Teeth and Oropharynx as per pre-operative assessment

## 2016-01-03 NOTE — H&P (View-Only) (Signed)
Patient name: Christopher [name] MRN: [id number] DOB: [birth year] Sex: male  REASON FOR VISIT: Follow-up left radiocephalic fistula creation by myself on 11/08/2015  HPI: Christopher [name] is a 82 y.o. male here today for follow-up. He reports that he is not seen Dr. Mercy Moore back but has not been told that he has any change in his renal function. His main disability continues to be a hip fracture and he walks with a rolling walker for this. Here today with his wife. They plan a trip to the beach for 2 weeks Christopher [name] October for this since they have been able to do this for several years. He has minimal discomfort associated with his wrist incision.  Current Outpatient Prescriptions  Medication Sig Dispense Refill  . amiodarone (PACERONE) 200 MG tablet Take 1 tablet (200 mg total) by mouth every morning.    Marland Kitchen atenolol (TENORMIN) 25 MG tablet Take 25 mg by mouth every morning.     . Calcium Carb-Cholecalciferol (CALTRATE 600+D3 SOFT PO) Take 1 tablet by mouth 2 (two) times daily.     . cholecalciferol (VITAMIN D) 1000 units tablet Take 1,000 Units by mouth daily.    . Darbepoetin Alfa (ARANESP) 100 MCG/0.5ML SOSY injection Inject 100 mcg into the skin every 21 ( twenty-one) days.    . folic acid (FOLVITE) A999333 MCG tablet Take 400 mcg by mouth daily.      . furosemide (LASIX) 80 MG tablet Take 1-2 tablets (80-160 mg total) by mouth See admin instructions. Takes two tablets (160 mg) in the morning and one tablet (80 mg) at dinner.    Marland Kitchen glimepiride (AMARYL) 1 MG tablet Take 1 tablet (1 mg total) by mouth daily before breakfast. 30 tablet 1  . Glucosamine HCl 1500 MG TABS Take 1,500 mg by mouth 2 (two) times daily.     . Horse Chestnut 300 MG CAPS Take 300 mg by mouth daily.     . Multiple Vitamin (MULTIVITAMIN WITH MINERALS) TABS tablet Take 1 tablet by mouth daily.    . nitroGLYCERIN (NITROSTAT) 0.4 MG SL tablet Place 0.4 mg under the tongue every 5 (five) minutes as needed  for chest pain.    . Omega-3 Fatty Acids (FISH OIL) 1200 MG CAPS Take 1,200 mg by mouth daily.    Marland Kitchen oxyCODONE-acetaminophen (PERCOCET/ROXICET) 5-325 MG tablet Take 1 tablet by mouth every 6 (six) hours as needed. 6 tablet 0  . Polyethylene Glycol 3350 (MIRALAX PO) Take 17 g by mouth daily.     . sevelamer carbonate (RENVELA) 800 MG tablet Take 1 tablet (800 mg total) by mouth 3 (three) times daily with meals. 90 tablet 0  . warfarin (COUMADIN) 4 MG tablet Take 0.5-1 tablets (2-4 mg total) by mouth daily. 2 mg on Monday, Wednesday, and Friday.  4 mg all other days.     No current facility-administered medications for this visit.      PHYSICAL EXAM: Vitals:   12/05/15 0939  BP: 131/81  Pulse: 74  Resp: 18  Temp: 97 F (36.1 C)  TempSrc: Oral  SpO2: 96%  Weight: 106.6 kg (235 lb)  Height: 6\' 2"  (1.88 m)    GENERAL: The patient is a well-nourished male, in no acute distress. The vital signs are documented above. On physical exam his wrist incision is well-healed. Unfortunately he has no thrill or bruit present and is radiocephalic fistula  Duplex of this reveals occlusion of his radiocephalic fistula.  MEDICAL ISSUES: Discussed options with the patient. He did  have felt to be adequate size for radiocephalic attempt. The vein is somewhat larger at the antecubital space and is patent to the upper arm. I have recommended attempt at brachiocephalic fistula. Explain the potential for failure of this as well. If this fails would consider waiting for eminent need for placing a right arm access. He is to see Dr. Mercy Moore in mid-October. Will defer surgery until he is spoken with Dr. Mercy Moore did assure that would still upper for having fistula placed. I do feel he has a good chance of having success with an upper arm fistula.   [name], MD FACS [facility] Tel [phone] Pager [phone]

## 2016-01-03 NOTE — Interval H&P Note (Signed)
History and Physical Interval Note:  01/03/2016 12:22 PM  Christopher [name]  has presented today for surgery, with the diagnosis of Stage IV Chronic Kidney Disease N18.4  The various methods of treatment have been discussed with the patient and family. After consideration of risks, benefits and other options for treatment, the patient has consented to  Procedure(s): BRACHIOCEPHALIC ARTERIOVENOUS (AV) FISTULA CREATION (Left) as a surgical intervention .  The patient's history has been reviewed, patient examined, no change in status, stable for surgery.  I have reviewed the patient's chart and labs.  Questions were answered to the patient's satisfaction.     Curt Jews

## 2016-01-03 NOTE — Discharge Instructions (Signed)
° ° °  01/03/2016 Christopher [name] [id number] Mar 20, 1926  Surgeon(s): [name], MD  Procedure(s): Creation of BRACHIOCEPHALIC ARTERIOVENOUS (AV) FISTULA Left arm  x Do not stick fistula for 12 weeks

## 2016-01-03 NOTE — Anesthesia Preprocedure Evaluation (Signed)
Anesthesia Evaluation  Patient identified by MRN, date of birth, ID band Patient awake    Reviewed: Allergy & Precautions, NPO status , Patient's Chart, lab work & pertinent test results, reviewed documented beta blocker date and time   Airway Mallampati: II  TM Distance: >3 FB Neck ROM: Full    Dental no notable dental hx.    Pulmonary neg pulmonary ROS, former smoker,    Pulmonary exam normal breath sounds clear to auscultation       Cardiovascular hypertension, Pt. on medications and Pt. on home beta blockers + CAD, + CABG and +CHF  Normal cardiovascular exam+ dysrhythmias + pacemaker  Rhythm:Regular Rate:Normal     Neuro/Psych negative neurological ROS  negative psych ROS   GI/Hepatic negative GI ROS, Neg liver ROS,   Endo/Other  diabetes  Renal/GU ESRFRenal disease  negative genitourinary   Musculoskeletal negative musculoskeletal ROS (+)   Abdominal   Peds negative pediatric ROS (+)  Hematology  (+) anemia ,   Anesthesia Other Findings   Reproductive/Obstetrics negative OB ROS                             Lab Results  Component Value Date   WBC 4.5 07/21/2015   HGB 12.2 (L) 01/03/2016   HCT 36.0 (L) 01/03/2016   MCV 97.5 07/21/2015   PLT 175 07/21/2015   Lab Results  Component Value Date   CREATININE 3.38 (H) 07/21/2015   BUN 70 (H) 07/21/2015   NA 142 01/03/2016   K 5.2 (H) 01/03/2016   CL 101 07/21/2015   CO2 31 07/21/2015    Anesthesia Physical  Anesthesia Plan  ASA: III  Anesthesia Plan: MAC   Post-op Pain Management:    Induction: Intravenous  Airway Management Planned: Simple Face Mask  Additional Equipment:   Intra-op Plan:   Post-operative Plan:   Informed Consent: I have reviewed the patients History and Physical, chart, labs and discussed the procedure including the risks, benefits and alternatives for the proposed anesthesia with the patient  or authorized representative who has indicated his/her understanding and acceptance.   Dental advisory given  Plan Discussed with: CRNA  Anesthesia Plan Comments:         Anesthesia Quick Evaluation

## 2016-01-03 NOTE — Anesthesia Procedure Notes (Signed)
Performed by: Moshe Salisbury

## 2016-01-03 NOTE — Transfer of Care (Signed)
Immediate Anesthesia Transfer of Care Note  Patient: Christopher [name]  Procedure(s) Performed: Procedure(s): Creation of BRACHIOCEPHALIC ARTERIOVENOUS (AV) FISTULA Left arm (Left)  Patient Location: PACU  Anesthesia Type:MAC  Level of Consciousness: awake, oriented and patient cooperative  Airway & Oxygen Therapy: Patient Spontanous Breathing  Post-op Assessment: Report given to RN, Post -op Vital signs reviewed and stable and Patient moving all extremities  Post vital signs: Reviewed and stable  Last Vitals:  Vitals:   01/03/16 1114  BP: 136/71  Pulse: 73  Resp: 18  Temp: 36.6 C    Last Pain:  Vitals:   01/03/16 1114  TempSrc: Oral         Complications: No apparent anesthesia complications

## 2016-01-03 NOTE — Op Note (Signed)
    OPERATIVE REPORT  DATE OF SURGERY: 01/03/2016  PATIENT: Christopher [name], 82 y.o. male MRN: [id number]  DOB: 1926-04-29  PRE-OPERATIVE DIAGNOSIS: Chronic renal insufficiency  POST-OPERATIVE DIAGNOSIS:  Same  PROCEDURE: Left brachiocephalic AV fistula  SURGEON:  Curt Jews, M.D.  PHYSICIAN ASSISTANT: Samantha Rhyne PA-C  ANESTHESIA:  Local with sedation  EBL: Minimal ml  Total I/O In: 200 [I.V.:200] Out: -   BLOOD ADMINISTERED: None  DRAINS: None  SPECIMEN: None  COUNTS CORRECT:  YES  PLAN OF CARE: PACU   PATIENT DISPOSITION:  PACU - hemodynamically stable  PROCEDURE DETAILS: The patient was taken to the operative placed supine position with the area of the left arm prepped draped in sterile fashion. Using local anesthesia incision made over the antecubital space and carried down to isolate the cephalic vein and the brachial artery. Tributary branches of the cephalic vein were ligated with 301 4-0 silk ties and divided. The vein was ligated distally and divided and was brought to the level of the brachial artery. The artery was occluded proximally and distally was opened with 11 blade incision longitudinally with Potts scissors. The vein was spatulated and sewn end-to-side to the artery with a running 6-0 Prolene suture. Clamps were removed and excellent thrill was noted. The wounds were irrigated with saline. Hemostasis was obtained left cautery. The wounds were closed with 3-0 Vicryl subcutaneous and cuticular tissue. Benzoin and Steri-Strips were applied and the patient was transferred to the recovery room in stable condition with a palpable radial pulse   [name], M.D., [facility] 01/03/2016 5:02 PM

## 2016-01-03 NOTE — Anesthesia Postprocedure Evaluation (Signed)
Anesthesia Post Note  Patient: Christopher [name]  Procedure(s) Performed: Procedure(s) (LRB): Creation of BRACHIOCEPHALIC ARTERIOVENOUS (AV) FISTULA Left arm (Left)  Patient location during evaluation: PACU Anesthesia Type: MAC Level of consciousness: awake and alert Pain management: pain level controlled Vital Signs Assessment: post-procedure vital signs reviewed and stable Respiratory status: spontaneous breathing, nonlabored ventilation, respiratory function stable and patient connected to nasal cannula oxygen Cardiovascular status: stable and blood pressure returned to baseline Anesthetic complications: no    Last Vitals:  Vitals:   01/03/16 1408 01/03/16 1422  BP:  110/66  Pulse:  71  Resp:  15  Temp: 36.4 C     Last Pain:  Vitals:   01/03/16 1114  TempSrc: Oral                 Tiajuana Amass

## 2016-01-04 ENCOUNTER — Telehealth: Payer: Self-pay | Admitting: Vascular Surgery

## 2016-01-04 ENCOUNTER — Encounter (HOSPITAL_COMMUNITY): Payer: Self-pay | Admitting: Vascular Surgery

## 2016-01-04 DIAGNOSIS — I119 Hypertensive heart disease without heart failure: Secondary | ICD-10-CM | POA: Diagnosis not present

## 2016-01-04 DIAGNOSIS — Z950 Presence of cardiac pacemaker: Secondary | ICD-10-CM | POA: Diagnosis not present

## 2016-01-04 DIAGNOSIS — I480 Paroxysmal atrial fibrillation: Secondary | ICD-10-CM | POA: Diagnosis not present

## 2016-01-04 DIAGNOSIS — I2510 Atherosclerotic heart disease of native coronary artery without angina pectoris: Secondary | ICD-10-CM | POA: Diagnosis not present

## 2016-01-04 DIAGNOSIS — Z951 Presence of aortocoronary bypass graft: Secondary | ICD-10-CM | POA: Diagnosis not present

## 2016-01-04 DIAGNOSIS — N184 Chronic kidney disease, stage 4 (severe): Secondary | ICD-10-CM | POA: Diagnosis not present

## 2016-01-04 DIAGNOSIS — Z7901 Long term (current) use of anticoagulants: Secondary | ICD-10-CM | POA: Diagnosis not present

## 2016-01-04 DIAGNOSIS — E785 Hyperlipidemia, unspecified: Secondary | ICD-10-CM | POA: Diagnosis not present

## 2016-01-04 DIAGNOSIS — I5032 Chronic diastolic (congestive) heart failure: Secondary | ICD-10-CM | POA: Diagnosis not present

## 2016-01-04 DIAGNOSIS — R0602 Shortness of breath: Secondary | ICD-10-CM | POA: Diagnosis not present

## 2016-01-04 DIAGNOSIS — E1122 Type 2 diabetes mellitus with diabetic chronic kidney disease: Secondary | ICD-10-CM | POA: Diagnosis not present

## 2016-01-04 NOTE — Telephone Encounter (Signed)
-----   Message from Mena Goes, RN sent at 01/03/2016  2:59 PM EDT ----- Regarding: schedule 4 weeks no duplex   ----- Message ----- From: Gabriel Earing, PA-C Sent: 01/03/2016   2:06 PM To: [facility] Charge Pool  S/p left BC AVF 01/03/16.  F/u with Dr. Donnetta Hutching in 4 weeks.  He does not need duplex.  Thanks

## 2016-01-04 NOTE — Telephone Encounter (Signed)
sched appt 02/06/16 at 10:45. Spoke to pt to inform them of appt.

## 2016-01-08 ENCOUNTER — Telehealth: Payer: Self-pay

## 2016-01-08 NOTE — Telephone Encounter (Signed)
Pt. called to report swelling of left arm from fingers to shoulder.  Stated he was not given the direction at discharge, to elevate the arm at intervals. Stated the incision looks good; reported "it looks like it is healing."  Denied fever/ chills.  Denied discoloration of the fingers, or change in temperature of left hand.   Encouraged to elevate left arm above level of heart, supported on pillows, several times/ day, and to do active range of motion with fingers of left hand.  Encouraged to call office if symptoms don't improve.  Verb. Understanding.

## 2016-01-09 NOTE — Telephone Encounter (Signed)
rec'd phone call from pt. stating the left arm swelling had improved yesterday with elevating at intervals.  Reported that when he got up this morning, the arm had swelled up again.  Discussed with Dr. Donnetta Hutching.  Advised the swelling is likely related to the pacemaker on the left chest, and one of the wires causing a narrowing of a vein.  Further advised that this may resolve in time, or it may not.  Suggested to give pt. option of continuing to monitor, and give time for the swelling to resolve, or to schedule an appt. with Dr. Donnetta Hutching next week, to evaluate left arm.  Phone call to pt.  Advised of Dr. Luther Parody recommendations.  Stated he would feel better if Dr. Donnetta Hutching looked at his arm.  Appt. Given for 01/16/16 @ 9:45 AM.  Agreed.

## 2016-01-10 DIAGNOSIS — I5032 Chronic diastolic (congestive) heart failure: Secondary | ICD-10-CM | POA: Diagnosis not present

## 2016-01-10 DIAGNOSIS — E785 Hyperlipidemia, unspecified: Secondary | ICD-10-CM | POA: Diagnosis not present

## 2016-01-10 DIAGNOSIS — I480 Paroxysmal atrial fibrillation: Secondary | ICD-10-CM | POA: Diagnosis not present

## 2016-01-10 DIAGNOSIS — Z951 Presence of aortocoronary bypass graft: Secondary | ICD-10-CM | POA: Diagnosis not present

## 2016-01-10 DIAGNOSIS — I119 Hypertensive heart disease without heart failure: Secondary | ICD-10-CM | POA: Diagnosis not present

## 2016-01-10 DIAGNOSIS — R0602 Shortness of breath: Secondary | ICD-10-CM | POA: Diagnosis not present

## 2016-01-10 DIAGNOSIS — I2510 Atherosclerotic heart disease of native coronary artery without angina pectoris: Secondary | ICD-10-CM | POA: Diagnosis not present

## 2016-01-10 DIAGNOSIS — N184 Chronic kidney disease, stage 4 (severe): Secondary | ICD-10-CM | POA: Diagnosis not present

## 2016-01-10 DIAGNOSIS — Z950 Presence of cardiac pacemaker: Secondary | ICD-10-CM | POA: Diagnosis not present

## 2016-01-10 DIAGNOSIS — Z7901 Long term (current) use of anticoagulants: Secondary | ICD-10-CM | POA: Diagnosis not present

## 2016-01-10 DIAGNOSIS — E1122 Type 2 diabetes mellitus with diabetic chronic kidney disease: Secondary | ICD-10-CM | POA: Diagnosis not present

## 2016-01-12 ENCOUNTER — Encounter: Payer: Self-pay | Admitting: Vascular Surgery

## 2016-01-12 DIAGNOSIS — C61 Malignant neoplasm of prostate: Secondary | ICD-10-CM | POA: Diagnosis not present

## 2016-01-15 ENCOUNTER — Encounter (HOSPITAL_COMMUNITY)
Admission: RE | Admit: 2016-01-15 | Discharge: 2016-01-15 | Disposition: A | Discharge status: Home / Self Care | Payer: Medicare Other | Source: Ambulatory Visit | Attending: Nephrology | Admitting: Nephrology

## 2016-01-15 DIAGNOSIS — N184 Chronic kidney disease, stage 4 (severe): Secondary | ICD-10-CM | POA: Diagnosis not present

## 2016-01-15 DIAGNOSIS — H04123 Dry eye syndrome of bilateral lacrimal glands: Secondary | ICD-10-CM | POA: Diagnosis not present

## 2016-01-15 DIAGNOSIS — D638 Anemia in other chronic diseases classified elsewhere: Secondary | ICD-10-CM | POA: Insufficient documentation

## 2016-01-15 DIAGNOSIS — H01002 Unspecified blepharitis right lower eyelid: Secondary | ICD-10-CM | POA: Diagnosis not present

## 2016-01-15 DIAGNOSIS — H01001 Unspecified blepharitis right upper eyelid: Secondary | ICD-10-CM | POA: Diagnosis not present

## 2016-01-15 DIAGNOSIS — H01004 Unspecified blepharitis left upper eyelid: Secondary | ICD-10-CM | POA: Diagnosis not present

## 2016-01-15 MED ORDER — DARBEPOETIN ALFA 100 MCG/0.5ML IJ SOSY
100.0000 ug | PREFILLED_SYRINGE | INTRAMUSCULAR | Status: DC
  Administered 2016-01-15: 100 ug via SUBCUTANEOUS

## 2016-01-15 MED ORDER — DARBEPOETIN ALFA 100 MCG/0.5ML IJ SOSY
PREFILLED_SYRINGE | INTRAMUSCULAR | Status: AC
  Filled 2016-01-15: qty 0.5

## 2016-01-16 ENCOUNTER — Encounter (HOSPITAL_COMMUNITY): Payer: Self-pay | Admitting: *Deleted

## 2016-01-16 ENCOUNTER — Encounter: Payer: Self-pay | Admitting: Vascular Surgery

## 2016-01-16 ENCOUNTER — Other Ambulatory Visit: Payer: Self-pay

## 2016-01-16 ENCOUNTER — Ambulatory Visit (INDEPENDENT_AMBULATORY_CARE_PROVIDER_SITE_OTHER): Payer: Medicare Other | Admitting: Vascular Surgery

## 2016-01-16 VITALS — BP 131/70 | HR 80 | Temp 97.6°F | Resp 18 | Ht 74.0 in | Wt 246.3 lb

## 2016-01-16 DIAGNOSIS — N184 Chronic kidney disease, stage 4 (severe): Secondary | ICD-10-CM

## 2016-01-16 LAB — POCT HEMOGLOBIN-HEMACUE: HEMOGLOBIN: 9.6 g/dL — AB (ref 13.0–17.0)

## 2016-01-16 NOTE — Progress Notes (Signed)
Pt denies chest pain but stated that he gets SOB with exertion  " since last procedure." Pt stated that he last stress test was performed > 10 years ago and he is currently under the care of Dr. Wynonia Lawman, Cardiology. Pt made aware to stop taking vitamins, fish oil  and herbal medications such as Glucosamine and Horse Chestnut. . Do not take any NSAIDs ie: Ibuprofen, Advil, Naproxen , BC and Goody Powder. Pt stated that his fasting blood glucose ranges between 100-130. Pt stated that his last dose of Coumadin was Monday as instructed. Pt made aware of diabetes protocol to not take Glimepiride tonight or the morning of surgery, check blood glucose every 2 hours prior to arrival the morning of surgery, interventions for a blood glucose <70 and > 220 and # SS. Pt verbalized understanding of all pre-op instructions. Peri-op prescription for ICD faxed.

## 2016-01-16 NOTE — Progress Notes (Signed)
Patient name: Christopher [name] MRN: [id number] DOB: 1926/07/27 Sex: male  REASON FOR VISIT: Follow-up recent left brachiocephalic fistula  HPI: Christopher [name] is a 82 y.o. male today for follow-up. Initially had a left radiocephalic fistula which had Yarethzi Branan failure. He underwent left brachiocephalic fistula on 0000000. He had nonvisualization of his right cephalic fistula and was felt to be left arm fistula was his best operative candidate. He did have a known patient maker in his left chest. Fortunately has had marked swelling in his left arm undoubtedly related to central venous occlusion or stenosis with a patent fistula.  Current Outpatient Prescriptions  Medication Sig Dispense Refill  . amiodarone (PACERONE) 200 MG tablet Take 1 tablet (200 mg total) by mouth every morning.    . carboxymethylcellulose (REFRESH PLUS) 0.5 % SOLN Place 1 drop into both eyes 3 (three) times daily as needed (for dry eyes.).    . Darbepoetin Alfa (ARANESP) 100 MCG/0.5ML SOSY injection Inject 100 mcg into the skin every 21 ( twenty-one) days.    . folic acid (FOLVITE) A999333 MCG tablet Take 400 mcg by mouth daily.      . furosemide (LASIX) 80 MG tablet Take 1 tablet (80 mg total) by mouth 2 (two) times daily.    Marland Kitchen glimepiride (AMARYL) 1 MG tablet Take 1 tablet (1 mg total) by mouth daily before breakfast. 30 tablet 1  . Glucosamine HCl 1500 MG TABS Take 1,500 mg by mouth 2 (two) times daily.     . Horse Chestnut 300 MG CAPS Take 300 mg by mouth daily.     Marland Kitchen HYDROcodone-acetaminophen (NORCO/VICODIN) 5-325 MG tablet Take 1 tablet by mouth every 6 (six) hours as needed. For pain. 6 tablet 0  . metoprolol succinate (TOPROL-XL) 25 MG 24 hr tablet Take 25 mg by mouth daily.    . Multiple Vitamin (MULTIVITAMIN WITH MINERALS) TABS tablet Take 1 tablet by mouth daily.    . nitroGLYCERIN (NITROSTAT) 0.4 MG SL tablet Place 0.4 mg under the tongue every 5 (five) minutes as needed for chest pain.     . Omega-3 Fatty Acids (FISH OIL) 1200 MG CAPS Take 1,200 mg by mouth 2 (two) times daily.     . Polyethylene Glycol 3350 (MIRALAX PO) Take 17 g by mouth daily.     Marland Kitchen warfarin (COUMADIN) 4 MG tablet Take 1 tablet (4 mg total) by mouth daily at 6 PM.     No current facility-administered medications for this visit.      PHYSICAL EXAM: Vitals:   01/16/16 1005  BP: 131/70  Pulse: 80  Resp: 18  Temp: 97.6 F (36.4 C)  TempSrc: Oral  SpO2: 98%  Weight: 246 lb 4.8 oz (111.7 kg)  Height: 6\' 2"  (1.88 m)    GENERAL: The patient is a well-nourished male, in no acute distress. The vital signs are documented above. Pitting edema throughout his left arm. His left antecubital incision is well-healed and he has excellent thrill and Roosevelt Bisher size maturation of the cephalic vein  MEDICAL ISSUES: A long discussion with patient and his wife present. Spleen only option would be abandonment of his fistula with ligation. Explained that this would result in the right rapid resolution of the swelling. He is on Coumadin. He has appointments that he would prefer not to cancel later in the week. Will proceed with the ligation of this tomorrow. Will hold his Coumadin tonight and then resume tomorrow. I am not concerned regarding this with the very superficial treatment  required for ligation of his fistula. He does not want to consider any other alternative access sites until he is on hemodialysis. I feel this is appropriate. I does understand that he required acute hemodialysis he could be treated acutely with a catheter within further evaluation of opportunities for access in his right arm.   [name], MD FACS [facility] Tel [phone] Pager [phone]

## 2016-01-17 ENCOUNTER — Encounter (HOSPITAL_COMMUNITY): Payer: Self-pay | Admitting: *Deleted

## 2016-01-17 ENCOUNTER — Encounter (HOSPITAL_COMMUNITY)
Admission: RE | Disposition: A | Discharge status: Home / Self Care | Payer: Self-pay | Source: Ambulatory Visit | Attending: Vascular Surgery

## 2016-01-17 ENCOUNTER — Ambulatory Visit (HOSPITAL_COMMUNITY): Payer: Medicare Other | Admitting: Anesthesiology

## 2016-01-17 ENCOUNTER — Ambulatory Visit (HOSPITAL_COMMUNITY)
Admission: RE | Admit: 2016-01-17 | Discharge: 2016-01-17 | Disposition: A | Discharge status: Home / Self Care | Payer: Medicare Other | Source: Ambulatory Visit | Attending: Vascular Surgery | Admitting: Vascular Surgery

## 2016-01-17 DIAGNOSIS — Z7901 Long term (current) use of anticoagulants: Secondary | ICD-10-CM | POA: Diagnosis not present

## 2016-01-17 DIAGNOSIS — M7989 Other specified soft tissue disorders: Secondary | ICD-10-CM | POA: Insufficient documentation

## 2016-01-17 DIAGNOSIS — Z888 Allergy status to other drugs, medicaments and biological substances status: Secondary | ICD-10-CM | POA: Diagnosis not present

## 2016-01-17 DIAGNOSIS — I509 Heart failure, unspecified: Secondary | ICD-10-CM | POA: Insufficient documentation

## 2016-01-17 DIAGNOSIS — T82898A Other specified complication of vascular prosthetic devices, implants and grafts, initial encounter: Secondary | ICD-10-CM | POA: Diagnosis not present

## 2016-01-17 DIAGNOSIS — E1122 Type 2 diabetes mellitus with diabetic chronic kidney disease: Secondary | ICD-10-CM | POA: Diagnosis not present

## 2016-01-17 DIAGNOSIS — I130 Hypertensive heart and chronic kidney disease with heart failure and stage 1 through stage 4 chronic kidney disease, or unspecified chronic kidney disease: Secondary | ICD-10-CM | POA: Diagnosis not present

## 2016-01-17 DIAGNOSIS — N184 Chronic kidney disease, stage 4 (severe): Secondary | ICD-10-CM | POA: Insufficient documentation

## 2016-01-17 DIAGNOSIS — Z885 Allergy status to narcotic agent status: Secondary | ICD-10-CM | POA: Diagnosis not present

## 2016-01-17 DIAGNOSIS — Y828 Other medical devices associated with adverse incidents: Secondary | ICD-10-CM | POA: Insufficient documentation

## 2016-01-17 DIAGNOSIS — I4891 Unspecified atrial fibrillation: Secondary | ICD-10-CM | POA: Diagnosis not present

## 2016-01-17 DIAGNOSIS — I2510 Atherosclerotic heart disease of native coronary artery without angina pectoris: Secondary | ICD-10-CM | POA: Insufficient documentation

## 2016-01-17 DIAGNOSIS — I129 Hypertensive chronic kidney disease with stage 1 through stage 4 chronic kidney disease, or unspecified chronic kidney disease: Secondary | ICD-10-CM | POA: Diagnosis not present

## 2016-01-17 DIAGNOSIS — Z951 Presence of aortocoronary bypass graft: Secondary | ICD-10-CM | POA: Diagnosis not present

## 2016-01-17 DIAGNOSIS — Z7984 Long term (current) use of oral hypoglycemic drugs: Secondary | ICD-10-CM | POA: Insufficient documentation

## 2016-01-17 DIAGNOSIS — Z880 Allergy status to penicillin: Secondary | ICD-10-CM | POA: Diagnosis not present

## 2016-01-17 DIAGNOSIS — Z950 Presence of cardiac pacemaker: Secondary | ICD-10-CM | POA: Diagnosis not present

## 2016-01-17 DIAGNOSIS — D631 Anemia in chronic kidney disease: Secondary | ICD-10-CM | POA: Insufficient documentation

## 2016-01-17 DIAGNOSIS — N185 Chronic kidney disease, stage 5: Secondary | ICD-10-CM | POA: Diagnosis not present

## 2016-01-17 HISTORY — PX: LIGATION OF ARTERIOVENOUS  FISTULA: SHX5948

## 2016-01-17 LAB — GLUCOSE, CAPILLARY
GLUCOSE-CAPILLARY: 122 mg/dL — AB (ref 65–99)
Glucose-Capillary: 123 mg/dL — ABNORMAL HIGH (ref 65–99)

## 2016-01-17 LAB — PROTIME-INR
INR: 2.19
Prothrombin Time: 24.7 seconds — ABNORMAL HIGH (ref 11.4–15.2)

## 2016-01-17 LAB — POCT I-STAT 4, (NA,K, GLUC, HGB,HCT)
GLUCOSE: 114 mg/dL — AB (ref 65–99)
HCT: 32 % — ABNORMAL LOW (ref 39.0–52.0)
HEMOGLOBIN: 10.9 g/dL — AB (ref 13.0–17.0)
POTASSIUM: 3.8 mmol/L (ref 3.5–5.1)
Sodium: 143 mmol/L (ref 135–145)

## 2016-01-17 LAB — APTT: aPTT: 41 seconds — ABNORMAL HIGH (ref 24–36)

## 2016-01-17 SURGERY — LIGATION OF ARTERIOVENOUS  FISTULA
Anesthesia: Monitor Anesthesia Care | Site: Arm Upper | Laterality: Left

## 2016-01-17 MED ORDER — HYDROMORPHONE HCL 1 MG/ML IJ SOLN
0.2500 mg | INTRAMUSCULAR | Status: DC | PRN
Start: 2016-01-17 — End: 2016-01-17

## 2016-01-17 MED ORDER — LIDOCAINE HCL 0.5 % IJ SOLN
INTRAMUSCULAR | Status: DC | PRN
  Administered 2016-01-17: 50 mL

## 2016-01-17 MED ORDER — PROPOFOL 10 MG/ML IV BOLUS
INTRAVENOUS | Status: DC | PRN
  Administered 2016-01-17: 30 mg via INTRAVENOUS

## 2016-01-17 MED ORDER — 0.9 % SODIUM CHLORIDE (POUR BTL) OPTIME
TOPICAL | Status: DC | PRN
  Administered 2016-01-17: 1000 mL

## 2016-01-17 MED ORDER — PROMETHAZINE HCL 25 MG/ML IJ SOLN: 6.2500 mg | INTRAMUSCULAR | Status: DC | PRN

## 2016-01-17 MED ORDER — PHENYLEPHRINE HCL 10 MG/ML IJ SOLN
INTRAMUSCULAR | Status: DC | PRN
  Administered 2016-01-17 (×2): 80 ug via INTRAVENOUS

## 2016-01-17 MED ORDER — LIDOCAINE HCL (PF) 0.5 % IJ SOLN
INTRAMUSCULAR | Status: AC
  Filled 2016-01-17: qty 50

## 2016-01-17 MED ORDER — SODIUM CHLORIDE 0.9 % IV SOLN
INTRAVENOUS | Status: DC
  Administered 2016-01-17 (×2): via INTRAVENOUS

## 2016-01-17 MED ORDER — PROPOFOL 500 MG/50ML IV EMUL
INTRAVENOUS | Status: DC | PRN
  Administered 2016-01-17: 50 ug/kg/min via INTRAVENOUS

## 2016-01-17 MED ORDER — WARFARIN SODIUM 4 MG PO TABS: 4.0000 mg | ORAL_TABLET | Freq: Every day | ORAL | Status: DC

## 2016-01-17 MED ORDER — HYDROCODONE-ACETAMINOPHEN 5-325 MG PO TABS: 1.0000 | ORAL_TABLET | Freq: Four times a day (QID) | ORAL | 0 refills | Status: DC | PRN

## 2016-01-17 MED ORDER — VANCOMYCIN HCL IN DEXTROSE 1-5 GM/200ML-% IV SOLN
1000.0000 mg | INTRAVENOUS | Status: AC
  Administered 2016-01-17: 1000 mg via INTRAVENOUS
  Filled 2016-01-17: qty 200

## 2016-01-17 MED ORDER — CHLORHEXIDINE GLUCONATE CLOTH 2 % EX PADS: 6.0000 | MEDICATED_PAD | Freq: Once | CUTANEOUS | Status: DC

## 2016-01-17 SURGICAL SUPPLY — 25 items
CANISTER SUCTION 2500CC (MISCELLANEOUS) ×3 IMPLANT
CLIP LIGATING EXTRA MED SLVR (CLIP) ×3 IMPLANT
CLIP LIGATING EXTRA SM BLUE (MISCELLANEOUS) ×3 IMPLANT
DECANTER SPIKE VIAL GLASS SM (MISCELLANEOUS) ×3 IMPLANT
DERMABOND ADVANCED (GAUZE/BANDAGES/DRESSINGS) ×2
DERMABOND ADVANCED .7 DNX12 (GAUZE/BANDAGES/DRESSINGS) ×1 IMPLANT
ELECT REM PT RETURN 9FT ADLT (ELECTROSURGICAL) ×3
ELECTRODE REM PT RTRN 9FT ADLT (ELECTROSURGICAL) ×1 IMPLANT
GLOVE SS BIOGEL STRL SZ 7.5 (GLOVE) ×1 IMPLANT
GLOVE SUPERSENSE BIOGEL SZ 7.5 (GLOVE) ×2
GLOVE SURG SS PI 6.0 STRL IVOR (GLOVE) ×3 IMPLANT
GOWN STRL REUS W/ TWL LRG LVL3 (GOWN DISPOSABLE) ×3 IMPLANT
GOWN STRL REUS W/TWL LRG LVL3 (GOWN DISPOSABLE) ×6
KIT BASIN OR (CUSTOM PROCEDURE TRAY) ×3 IMPLANT
KIT ROOM TURNOVER OR (KITS) ×3 IMPLANT
NS IRRIG 1000ML POUR BTL (IV SOLUTION) ×3 IMPLANT
PACK CV ACCESS (CUSTOM PROCEDURE TRAY) ×3 IMPLANT
PAD ARMBOARD 7.5X6 YLW CONV (MISCELLANEOUS) ×6 IMPLANT
SUT ETHILON 3 0 PS 1 (SUTURE) IMPLANT
SUT PROLENE 6 0 CC (SUTURE) IMPLANT
SUT SILK 0 TIES 10X30 (SUTURE) ×3 IMPLANT
SUT VIC AB 3-0 SH 27 (SUTURE) ×3
SUT VIC AB 3-0 SH 27X BRD (SUTURE) ×1 IMPLANT
UNDERPAD 30X30 (UNDERPADS AND DIAPERS) ×3 IMPLANT
WATER STERILE IRR 1000ML POUR (IV SOLUTION) ×3 IMPLANT

## 2016-01-17 NOTE — Anesthesia Postprocedure Evaluation (Signed)
Anesthesia Post Note  Patient: Christopher [name]  Procedure(s) Performed: Procedure(s) (LRB): LIGATION OF ARTERIOVENOUS  FISTULA UPPER ARM FISTULA (Left)  Patient location during evaluation: PACU Anesthesia Type: MAC Level of consciousness: awake and alert Pain management: pain level controlled Vital Signs Assessment: post-procedure vital signs reviewed and stable Respiratory status: spontaneous breathing and respiratory function stable Cardiovascular status: stable Anesthetic complications: no    Last Vitals:  Vitals:   01/17/16 1430 01/17/16 1445  BP: 122/73 129/77  Pulse: 70 82  Resp: 17 16  Temp: 36.4 C     Last Pain:  Vitals:   01/17/16 1445  TempSrc:   PainSc: 0-No pain                 Cortlin Marano,Loye DANIEL

## 2016-01-17 NOTE — Op Note (Signed)
    OPERATIVE REPORT  DATE OF SURGERY: 01/17/2016  PATIENT: Christopher [name], 81 y.o. male MRN: [id number]  DOB: 07-04-1926  PRE-OPERATIVE DIAGNOSIS: Venous hypertension left arm related to left brachiocephalic AV fistula  POST-OPERATIVE DIAGNOSIS:  Same  PROCEDURE: Ligation of left brachiocephalic AV fistula  SURGEON:  Curt Jews, M.D.  PHYSICIAN ASSISTANT: Nurse  ANESTHESIA:  Local with sedation  EBL: Minimal ml  Total I/O In: 250 [I.V.:250] Out: 2 [Blood:2]  BLOOD ADMINISTERED: None  DRAINS: None  SPECIMEN: None  COUNTS CORRECT:  YES  PLAN OF CARE: PACU stable   PATIENT DISPOSITION:  PACU - hemodynamically stable  PROCEDURE DETAILS: The patient was taken to the operative placed supine position with the area of the left arm prepped in sterile fashion. The recent antecubital incision was reopened using local anesthesia. The cephalic vein was ligated and tied near the arterial anastomosis. The wound was irrigated with saline. Wound was closed with 3-0 Vicryl in the subcutaneous and subcuticular tissue. Dermabond was applied the patient was transferred to the recovery room in stable condition   [name], M.D., [facility] 01/17/2016 1:26 PM

## 2016-01-17 NOTE — Anesthesia Preprocedure Evaluation (Signed)
Anesthesia Evaluation  Patient identified by MRN, date of birth, ID band Patient awake    Reviewed: Allergy & Precautions, NPO status , Patient's Chart, lab work & pertinent test results, reviewed documented beta blocker date and time   History of Anesthesia Complications Negative for: history of anesthetic complications  Airway Mallampati: II  TM Distance: >3 FB Neck ROM: Full    Dental no notable dental hx. (+) Dental Advisory Given   Pulmonary neg pulmonary ROS, former smoker,    Pulmonary exam normal breath sounds clear to auscultation       Cardiovascular hypertension, Pt. on medications and Pt. on home beta blockers + CAD, + CABG and +CHF  Normal cardiovascular exam+ dysrhythmias + pacemaker  Rhythm:Regular Rate:Normal     Neuro/Psych negative neurological ROS  negative psych ROS   GI/Hepatic negative GI ROS, Neg liver ROS,   Endo/Other  diabetes  Renal/GU ESRFRenal disease  negative genitourinary   Musculoskeletal negative musculoskeletal ROS (+)   Abdominal   Peds negative pediatric ROS (+)  Hematology  (+) anemia ,   Anesthesia Other Findings   Reproductive/Obstetrics negative OB ROS                             Lab Results  Component Value Date   WBC 4.5 07/21/2015   HGB 9.6 (L) 01/15/2016   HCT 36.0 (L) 01/03/2016   MCV 97.5 07/21/2015   PLT 175 07/21/2015   Lab Results  Component Value Date   CREATININE 3.38 (H) 07/21/2015   BUN 70 (H) 07/21/2015   NA 142 01/03/2016   K 5.2 (H) 01/03/2016   CL 101 07/21/2015   CO2 31 07/21/2015    Anesthesia Physical  Anesthesia Plan  ASA: III  Anesthesia Plan: MAC   Post-op Pain Management:    Induction: Intravenous  Airway Management Planned: Simple Face Mask  Additional Equipment:   Intra-op Plan:   Post-operative Plan:   Informed Consent: I have reviewed the patients History and Physical, chart, labs and  discussed the procedure including the risks, benefits and alternatives for the proposed anesthesia with the patient or authorized representative who has indicated his/her understanding and acceptance.   Dental advisory given  Plan Discussed with: CRNA  Anesthesia Plan Comments:         Anesthesia Quick Evaluation

## 2016-01-17 NOTE — Interval H&P Note (Signed)
History and Physical Interval Note:  01/17/2016 12:33 PM  Christopher [name]  has presented today for surgery, with the diagnosis of Stage IV Chronic Kidney Disease N18.4; Left arm swelling M79.89  The various methods of treatment have been discussed with the patient and family. After consideration of risks, benefits and other options for treatment, the patient has consented to  Procedure(s): LIGATION OF ARTERIOVENOUS  FISTULA (Left) as a surgical intervention .  The patient's history has been reviewed, patient examined, no change in status, stable for surgery.  I have reviewed the patient's chart and labs.  Questions were answered to the patient's satisfaction.     Curt Jews

## 2016-01-17 NOTE — H&P (View-Only) (Signed)
Patient name: Christopher [name] MRN: [id number] DOB: 1927/01/15 Sex: male  REASON FOR VISIT: Follow-up recent left brachiocephalic fistula  HPI: Christopher [name] is a 82 y.o. male today for follow-up. Initially had a left radiocephalic fistula which had Christopher [name] failure. He underwent left brachiocephalic fistula on 0000000. He had nonvisualization of his right cephalic fistula and was felt to be left arm fistula was his best operative candidate. He did have a known patient maker in his left chest. Fortunately has had marked swelling in his left arm undoubtedly related to central venous occlusion or stenosis with a patent fistula.  Current Outpatient Prescriptions  Medication Sig Dispense Refill  . amiodarone (PACERONE) 200 MG tablet Take 1 tablet (200 mg total) by mouth every morning.    . carboxymethylcellulose (REFRESH PLUS) 0.5 % SOLN Place 1 drop into both eyes 3 (three) times daily as needed (for dry eyes.).    . Darbepoetin Alfa (ARANESP) 100 MCG/0.5ML SOSY injection Inject 100 mcg into the skin every 21 ( twenty-one) days.    . folic acid (FOLVITE) A999333 MCG tablet Take 400 mcg by mouth daily.      . furosemide (LASIX) 80 MG tablet Take 1 tablet (80 mg total) by mouth 2 (two) times daily.    Marland Kitchen glimepiride (AMARYL) 1 MG tablet Take 1 tablet (1 mg total) by mouth daily before breakfast. 30 tablet 1  . Glucosamine HCl 1500 MG TABS Take 1,500 mg by mouth 2 (two) times daily.     . Horse Chestnut 300 MG CAPS Take 300 mg by mouth daily.     Marland Kitchen HYDROcodone-acetaminophen (NORCO/VICODIN) 5-325 MG tablet Take 1 tablet by mouth every 6 (six) hours as needed. For pain. 6 tablet 0  . metoprolol succinate (TOPROL-XL) 25 MG 24 hr tablet Take 25 mg by mouth daily.    . Multiple Vitamin (MULTIVITAMIN WITH MINERALS) TABS tablet Take 1 tablet by mouth daily.    . nitroGLYCERIN (NITROSTAT) 0.4 MG SL tablet Place 0.4 mg under the tongue every 5 (five) minutes as needed for chest pain.     . Omega-3 Fatty Acids (FISH OIL) 1200 MG CAPS Take 1,200 mg by mouth 2 (two) times daily.     . Polyethylene Glycol 3350 (MIRALAX PO) Take 17 g by mouth daily.     Marland Kitchen warfarin (COUMADIN) 4 MG tablet Take 1 tablet (4 mg total) by mouth daily at 6 PM.     No current facility-administered medications for this visit.      PHYSICAL EXAM: Vitals:   01/16/16 1005  BP: 131/70  Pulse: 80  Resp: 18  Temp: 97.6 F (36.4 C)  TempSrc: Oral  SpO2: 98%  Weight: 246 lb 4.8 oz (111.7 kg)  Height: 6\' 2"  (1.88 m)    GENERAL: The patient is a well-nourished male, in no acute distress. The vital signs are documented above. Pitting edema throughout his left arm. His left antecubital incision is well-healed and he has excellent thrill and Christopher [name] size maturation of the cephalic vein  MEDICAL ISSUES: A long discussion with patient and his wife present. Spleen only option would be abandonment of his fistula with ligation. Explained that this would result in the right rapid resolution of the swelling. He is on Coumadin. He has appointments that he would prefer not to cancel later in the week. Will proceed with the ligation of this tomorrow. Will hold his Coumadin tonight and then resume tomorrow. I am not concerned regarding this with the very superficial treatment  required for ligation of his fistula. He does not want to consider any other alternative access sites until he is on hemodialysis. I feel this is appropriate. I does understand that he required acute hemodialysis he could be treated acutely with a catheter within further evaluation of opportunities for access in his right arm.   Christopher Posner, MD FACS [facility] Tel [phone] Pager [phone]

## 2016-01-17 NOTE — Transfer of Care (Signed)
Immediate Anesthesia Transfer of Care Note  Patient: Christopher [name]  Procedure(s) Performed: Procedure(s): LIGATION OF ARTERIOVENOUS  FISTULA UPPER ARM FISTULA (Left)  Patient Location: PACU  Anesthesia Type:MAC  Level of Consciousness: awake, alert  and oriented  Airway & Oxygen Therapy: Patient Spontanous Breathing  Post-op Assessment: Report given to RN, Post -op Vital signs reviewed and stable and Patient moving all extremities X 4  Post vital signs: Reviewed and stable  Last Vitals:  Vitals:   01/17/16 1154  BP: (!) 129/58  Pulse: 76  Resp: 18  Temp: 36.6 C    Last Pain:  Vitals:   01/17/16 1154  TempSrc: Oral      Patients Stated Pain Goal: 8 ([id number])  Complications: No apparent anesthesia complications

## 2016-01-18 ENCOUNTER — Encounter (HOSPITAL_COMMUNITY): Payer: Self-pay | Admitting: Vascular Surgery

## 2016-01-19 DIAGNOSIS — N401 Enlarged prostate with lower urinary tract symptoms: Secondary | ICD-10-CM | POA: Diagnosis not present

## 2016-01-19 DIAGNOSIS — C61 Malignant neoplasm of prostate: Secondary | ICD-10-CM | POA: Diagnosis not present

## 2016-01-19 DIAGNOSIS — R351 Nocturia: Secondary | ICD-10-CM | POA: Diagnosis not present

## 2016-01-29 DIAGNOSIS — I2510 Atherosclerotic heart disease of native coronary artery without angina pectoris: Secondary | ICD-10-CM | POA: Diagnosis not present

## 2016-01-29 DIAGNOSIS — I119 Hypertensive heart disease without heart failure: Secondary | ICD-10-CM | POA: Diagnosis not present

## 2016-01-29 DIAGNOSIS — Z951 Presence of aortocoronary bypass graft: Secondary | ICD-10-CM | POA: Diagnosis not present

## 2016-01-29 DIAGNOSIS — E785 Hyperlipidemia, unspecified: Secondary | ICD-10-CM | POA: Diagnosis not present

## 2016-01-29 DIAGNOSIS — I480 Paroxysmal atrial fibrillation: Secondary | ICD-10-CM | POA: Diagnosis not present

## 2016-01-29 DIAGNOSIS — R0602 Shortness of breath: Secondary | ICD-10-CM | POA: Diagnosis not present

## 2016-01-29 DIAGNOSIS — Z950 Presence of cardiac pacemaker: Secondary | ICD-10-CM | POA: Diagnosis not present

## 2016-01-29 DIAGNOSIS — N184 Chronic kidney disease, stage 4 (severe): Secondary | ICD-10-CM | POA: Diagnosis not present

## 2016-01-29 DIAGNOSIS — Z7901 Long term (current) use of anticoagulants: Secondary | ICD-10-CM | POA: Diagnosis not present

## 2016-01-29 DIAGNOSIS — E1122 Type 2 diabetes mellitus with diabetic chronic kidney disease: Secondary | ICD-10-CM | POA: Diagnosis not present

## 2016-01-29 DIAGNOSIS — I5032 Chronic diastolic (congestive) heart failure: Secondary | ICD-10-CM | POA: Diagnosis not present

## 2016-02-05 ENCOUNTER — Encounter (HOSPITAL_COMMUNITY): Payer: Medicare Other

## 2016-02-05 DIAGNOSIS — N184 Chronic kidney disease, stage 4 (severe): Secondary | ICD-10-CM | POA: Diagnosis not present

## 2016-02-05 DIAGNOSIS — I119 Hypertensive heart disease without heart failure: Secondary | ICD-10-CM | POA: Diagnosis not present

## 2016-02-05 DIAGNOSIS — I2510 Atherosclerotic heart disease of native coronary artery without angina pectoris: Secondary | ICD-10-CM | POA: Diagnosis not present

## 2016-02-05 DIAGNOSIS — I480 Paroxysmal atrial fibrillation: Secondary | ICD-10-CM | POA: Diagnosis not present

## 2016-02-05 DIAGNOSIS — Z950 Presence of cardiac pacemaker: Secondary | ICD-10-CM | POA: Diagnosis not present

## 2016-02-05 DIAGNOSIS — R0602 Shortness of breath: Secondary | ICD-10-CM | POA: Diagnosis not present

## 2016-02-05 DIAGNOSIS — E785 Hyperlipidemia, unspecified: Secondary | ICD-10-CM | POA: Diagnosis not present

## 2016-02-05 DIAGNOSIS — E1122 Type 2 diabetes mellitus with diabetic chronic kidney disease: Secondary | ICD-10-CM | POA: Diagnosis not present

## 2016-02-05 DIAGNOSIS — Z951 Presence of aortocoronary bypass graft: Secondary | ICD-10-CM | POA: Diagnosis not present

## 2016-02-05 DIAGNOSIS — I5032 Chronic diastolic (congestive) heart failure: Secondary | ICD-10-CM | POA: Diagnosis not present

## 2016-02-05 DIAGNOSIS — Z7901 Long term (current) use of anticoagulants: Secondary | ICD-10-CM | POA: Diagnosis not present

## 2016-02-06 ENCOUNTER — Encounter: Payer: Medicare Other | Admitting: Vascular Surgery

## 2016-02-07 ENCOUNTER — Ambulatory Visit (HOSPITAL_COMMUNITY)
Admission: RE | Admit: 2016-02-07 | Discharge: 2016-02-07 | Disposition: A | Discharge status: Home / Self Care | Payer: Medicare Other | Source: Ambulatory Visit | Attending: Nephrology | Admitting: Nephrology

## 2016-02-07 DIAGNOSIS — N184 Chronic kidney disease, stage 4 (severe): Secondary | ICD-10-CM | POA: Diagnosis not present

## 2016-02-07 DIAGNOSIS — D638 Anemia in other chronic diseases classified elsewhere: Secondary | ICD-10-CM | POA: Diagnosis not present

## 2016-02-07 LAB — IRON AND TIBC
IRON: 103 ug/dL (ref 45–182)
Saturation Ratios: 32 % (ref 17.9–39.5)
TIBC: 322 ug/dL (ref 250–450)
UIBC: 219 ug/dL

## 2016-02-07 LAB — FERRITIN: Ferritin: 515 ng/mL — ABNORMAL HIGH (ref 24–336)

## 2016-02-07 MED ORDER — DARBEPOETIN ALFA 100 MCG/0.5ML IJ SOSY
100.0000 ug | PREFILLED_SYRINGE | INTRAMUSCULAR | Status: DC
  Administered 2016-02-07: 100 ug via SUBCUTANEOUS

## 2016-02-07 MED ORDER — DARBEPOETIN ALFA 100 MCG/0.5ML IJ SOSY
PREFILLED_SYRINGE | INTRAMUSCULAR | Status: AC
  Filled 2016-02-07: qty 0.5

## 2016-02-08 LAB — POCT HEMOGLOBIN-HEMACUE: HEMOGLOBIN: 10.9 g/dL — AB (ref 13.0–17.0)

## 2016-02-12 ENCOUNTER — Encounter: Payer: Self-pay | Admitting: Cardiology

## 2016-02-12 DIAGNOSIS — R0602 Shortness of breath: Secondary | ICD-10-CM | POA: Diagnosis not present

## 2016-02-12 DIAGNOSIS — E1122 Type 2 diabetes mellitus with diabetic chronic kidney disease: Secondary | ICD-10-CM | POA: Diagnosis not present

## 2016-02-12 DIAGNOSIS — Z950 Presence of cardiac pacemaker: Secondary | ICD-10-CM | POA: Diagnosis not present

## 2016-02-12 DIAGNOSIS — I2510 Atherosclerotic heart disease of native coronary artery without angina pectoris: Secondary | ICD-10-CM | POA: Diagnosis not present

## 2016-02-12 DIAGNOSIS — I480 Paroxysmal atrial fibrillation: Secondary | ICD-10-CM | POA: Diagnosis not present

## 2016-02-12 DIAGNOSIS — E785 Hyperlipidemia, unspecified: Secondary | ICD-10-CM | POA: Diagnosis not present

## 2016-02-12 DIAGNOSIS — Z951 Presence of aortocoronary bypass graft: Secondary | ICD-10-CM | POA: Diagnosis not present

## 2016-02-12 DIAGNOSIS — Z125 Encounter for screening for malignant neoplasm of prostate: Secondary | ICD-10-CM | POA: Diagnosis not present

## 2016-02-12 DIAGNOSIS — I119 Hypertensive heart disease without heart failure: Secondary | ICD-10-CM | POA: Diagnosis not present

## 2016-02-12 DIAGNOSIS — E1151 Type 2 diabetes mellitus with diabetic peripheral angiopathy without gangrene: Secondary | ICD-10-CM | POA: Diagnosis not present

## 2016-02-12 DIAGNOSIS — N184 Chronic kidney disease, stage 4 (severe): Secondary | ICD-10-CM | POA: Diagnosis not present

## 2016-02-12 DIAGNOSIS — Z7901 Long term (current) use of anticoagulants: Secondary | ICD-10-CM | POA: Diagnosis not present

## 2016-02-12 DIAGNOSIS — E784 Other hyperlipidemia: Secondary | ICD-10-CM | POA: Diagnosis not present

## 2016-02-12 DIAGNOSIS — I5032 Chronic diastolic (congestive) heart failure: Secondary | ICD-10-CM | POA: Diagnosis not present

## 2016-02-12 NOTE — Progress Notes (Signed)
Christopher [name]  Date of visit:  02/12/2016 DOB:  October 27, 1926    Age:  82 yrs. Medical record number:  [id number]     Account number:  [id number] Primary Care Provider: Prince Solian R ____________________________ CURRENT DIAGNOSES  1. CAD Native without angina  2. Chronic diastolic (congestive) heart failure  3. Paroxysmal atrial fibrillation  4. Chronic kidney disease, stage 4 (severe)  5. Hypertensive heart disease without heart failure  6. Long term (current) use of anticoagulants  7. Type 2 Diabetes Mellitus With Diabetic Chronic Kidney Disease  8. Hyperlipidemia  9. Presence of cardiac pacemaker  10. Presence of aortocoronary bypass graft  11. Dyspnea ____________________________ ALLERGIES  Atorvastatin, Joint aches  Hydrocodone, Intolerance-vomiting  Pravastatin, Joint aches  Rosuvastatin, Aches  Tramadol, Nausea ____________________________ MEDICATIONS  1. Fish Oil 1,000 mg Capsule, BID  2. glimepiride 1 mg Tablet, 1 p.o. daily  3. folic acid 016 mcg Tablet, 1 p.o. q.d.  4. horse chestnut 300 mg Capsule, 1 p.o. daily  5. multivitamin Capsule, 1 p.o. q.d.  6. warfarin 5 mg tablet, 1 p.o. daily or as directed  7. Glucosamine 500 mg tablet, 3 tablets BID  8. Miralax 17 gram oral powder packet, 1 p.o. daily  9. nitroglycerin 0.4 mg sublingual tablet, PRN  10. metoprolol succinate ER 25 mg tablet,extended release 24 hr, 1 p.o. daily  11. amiodarone 200 mg tablet, BID  12. furosemide 80 mg tablet, 2 tablets BID or as directed  13. hydrocodone 5 mg-acetaminophen 325 mg tablet, PRN ____________________________ HISTORY OF PRESENT ILLNESS  Patient returns for cardiac followup. The patient has a history of coronary artery disease with previous bypass grafting and also has had recurrent atrial fibrillation. He recently gone back into atrial fibrillation with worsening of heart failure symptoms and his amiodarone had been increased to twice daily. He on his own and reduced back to  once a day but we recently increased it back to twice daily and he returns today for followup. His son our has been therapeutic. He has had some diuresis since he was previously here. He has known stage IV 5 chronic kidney disease but attempts at placing a fistula has been difficult and as a result he no longer has plans to have a fistula placed prior to needing dialysis. It was thought that they would just place a catheter at the time of the procedure. He is quite symptomatic in atrial fibrillation and at the present time it is recommended that he have a trial of repeat cardioversion after he has been increased on his amiodarone to see if he would maintain sinus rhythm since it helps him symptomatically. He does have a known permanent pacemaker. Echocardiogram recently showed moderate left atrial enlargement with preserved LV systolic function. ____________________________ PAST HISTORY  Past Medical Illnesses:  hypertension, DM-non-insulin dependent, hyperlipidemia, lumbar disc disease, BPH, chronic kidney disease Stage 4, shingles, obesity, renovascular hypertension, prostate cancer treated with hormonal treatments;  Cardiovascular Illnesses:  CAD, sinus node dysfunction, atrial fibrillation, CHF diastolic;  Infectious Diseases:  no previous history of significant infectious diseases;  Surgical Procedures:  CABG w LIMA to LAD, SVG to OM-OM2 05/05/01 [facility]rickson, appendectomy, lumbar laminectomy x 2, hip repair right;  Trauma History:  no previous history of significant trauma;  NYHA Classification:  II;  Canadian Angina Classification:  Class 0: Asymptomatic;  Cardiology Procedures-Invasive:  cardiac cath (left) October 2011, Medtronic pacemaker implant May 2012, cardioversion May 2013, cardioversion May 2014, cardioversion 2017;  Cardiology Procedures-Noninvasive:  treadmill cardiolite October  2010, event monitor September 2011, echocardiogram May 2015;  Cardiac Cath Results:  60% left main, occluded  proximal  LAD, widely patent OM 1 OM 2 SVG, widely patent RCA, widely patent LAD LIMA graft;  Peripheral Vascular Procedures:  no previous invasive peripheral vascular procedures.;  LVEF of 55% documented via echocardiogram on 01/16/2015,  CHADS Score:  3,  ____________________________ SOCIAL HISTORY Alcohol Use:  does not use alcohol;  Smoking:  used to smoke but quit 1970;  Diet:  regular diet;  Lifestyle:  widowed and from first wife and remarried second wife;  Exercise:  exercise is limited due to physical disability;  Occupation:  retired and Educational psychologist person;  Residence:  lives with wife;   ____________________________ PHYSICAL EXAMINATION VITAL SIGNS  Blood Pressure:  130/70 Sitting, Left arm, regular cuff  , 140/80 Standing, Left arm and large cuff   Pulse:  80/min. Weight:  227.00 lbs. Height:  74"BMI: 29  Constitutional:  pleasant white male in no acute distress walks with walker Skin:  scattered hemangiomas, scattered sebaceous cysts Head:  normocephalic, normal hair pattern, no masses or tenderness Neck:  supple, without massess. No JVD, thyromegaly or carotid bruits. Carotid upstroke normal. Chest:  normal symmetry, clear to auscultation., healed median sternotomy scar Cardiac:  regular rhythm, normal S1 and S2, No S3 or S4, no murmurs, gallops or rubs detected. Peripheral Pulses:  the femoral,dorsalis pedis, and posterior tibial pulses are full and equal bilaterally with no bruits auscultated. Extremities & Back:  well healed saphenous vein donor site LLE, no edema present Neurological:  no gross motor or sensory deficits noted, affect appropriate, oriented x3. ____________________________ MOST RECENT LIPID PANEL 02/10/15  CHOL TOTL 204 mg/dl, LDL 135 NM, HDL 43 mg/dl, TRIGLYCER 132 mg/dl, ALT 21 u/l, ALK PHOS 53 u/l, CHOL/HDL 4.7 (Calc) and AST 25 u/l ____________________________ IMPRESSIONS/PLAN  1. Persistent atrial fibrillation symptomatic 2. Long-term  anticoagulation with warfarin 3. CAD with previous bypass grafting with no angina 4. Chronic diastolic heart failure 5. Stage 4-5 chronic kidney disease  Recommendations:  The patient will be admitted for same-day cardioversion. Lab work was done his primary care doctor's office earlier this morning.Cardioversion discussed with the patient including risks of stroke, arrhythmia, death, or anesthesia risks. The patient understands and is willing to proceed.. procedure will be done at the hospital by a colleague from [facility] in light of my recent knee replacement surgery and inability to go to the hospital.  ____________________________ TODAYS ORDERS  1. Coag Clinic Visit: Coag OV 2 weeks                       ____________________________ Cardiology Physician:  Kerry Hough MD [facility]

## 2016-02-14 ENCOUNTER — Other Ambulatory Visit: Payer: Self-pay | Admitting: Cardiology

## 2016-02-19 DIAGNOSIS — I25810 Atherosclerosis of coronary artery bypass graft(s) without angina pectoris: Secondary | ICD-10-CM | POA: Diagnosis not present

## 2016-02-19 DIAGNOSIS — E784 Other hyperlipidemia: Secondary | ICD-10-CM | POA: Diagnosis not present

## 2016-02-19 DIAGNOSIS — Z7901 Long term (current) use of anticoagulants: Secondary | ICD-10-CM | POA: Diagnosis not present

## 2016-02-19 DIAGNOSIS — E1122 Type 2 diabetes mellitus with diabetic chronic kidney disease: Secondary | ICD-10-CM | POA: Diagnosis not present

## 2016-02-19 DIAGNOSIS — J984 Other disorders of lung: Secondary | ICD-10-CM | POA: Diagnosis not present

## 2016-02-19 DIAGNOSIS — C61 Malignant neoplasm of prostate: Secondary | ICD-10-CM | POA: Diagnosis not present

## 2016-02-19 DIAGNOSIS — I480 Paroxysmal atrial fibrillation: Secondary | ICD-10-CM | POA: Diagnosis not present

## 2016-02-19 DIAGNOSIS — Z0000 Encounter for general adult medical examination without abnormal findings: Secondary | ICD-10-CM | POA: Diagnosis not present

## 2016-02-19 DIAGNOSIS — I10 Essential (primary) hypertension: Secondary | ICD-10-CM | POA: Diagnosis not present

## 2016-02-19 DIAGNOSIS — Z6831 Body mass index (BMI) 31.0-31.9, adult: Secondary | ICD-10-CM | POA: Diagnosis not present

## 2016-02-19 DIAGNOSIS — D509 Iron deficiency anemia, unspecified: Secondary | ICD-10-CM | POA: Diagnosis not present

## 2016-02-19 DIAGNOSIS — N184 Chronic kidney disease, stage 4 (severe): Secondary | ICD-10-CM | POA: Diagnosis not present

## 2016-02-21 ENCOUNTER — Ambulatory Visit (HOSPITAL_COMMUNITY)
Admission: RE | Admit: 2016-02-21 | Discharge: 2016-02-22 | Disposition: A | Discharge status: Home / Self Care | Payer: Medicare Other | Source: Ambulatory Visit | Attending: Cardiology | Admitting: Cardiology

## 2016-02-21 ENCOUNTER — Encounter (HOSPITAL_COMMUNITY)
Admission: RE | Disposition: A | Discharge status: Home / Self Care | Payer: Self-pay | Source: Ambulatory Visit | Attending: Cardiology

## 2016-02-21 SURGERY — CARDIOVERSION
Anesthesia: Monitor Anesthesia Care

## 2016-02-22 ENCOUNTER — Encounter (HOSPITAL_COMMUNITY): Payer: Self-pay | Admitting: Certified Registered Nurse Anesthetist

## 2016-02-22 ENCOUNTER — Encounter (HOSPITAL_COMMUNITY)
Admission: RE | Disposition: A | Discharge status: Home / Self Care | Payer: Self-pay | Source: Ambulatory Visit | Attending: Cardiology

## 2016-02-22 ENCOUNTER — Ambulatory Visit (HOSPITAL_COMMUNITY): Admission: RE | Admit: 2016-02-22 | Payer: Medicare Other | Source: Ambulatory Visit | Admitting: Cardiology

## 2016-02-22 ENCOUNTER — Ambulatory Visit (HOSPITAL_COMMUNITY): Payer: Medicare Other | Admitting: Anesthesiology

## 2016-02-22 ENCOUNTER — Encounter (HOSPITAL_COMMUNITY): Payer: Self-pay

## 2016-02-22 ENCOUNTER — Ambulatory Visit (HOSPITAL_COMMUNITY)
Admission: RE | Admit: 2016-02-22 | Discharge: 2016-02-22 | Disposition: A | Discharge status: Home / Self Care | Payer: Medicare Other | Source: Ambulatory Visit | Attending: Cardiology | Admitting: Cardiology

## 2016-02-22 DIAGNOSIS — Z950 Presence of cardiac pacemaker: Secondary | ICD-10-CM | POA: Insufficient documentation

## 2016-02-22 DIAGNOSIS — I2510 Atherosclerotic heart disease of native coronary artery without angina pectoris: Secondary | ICD-10-CM | POA: Diagnosis not present

## 2016-02-22 DIAGNOSIS — Z96659 Presence of unspecified artificial knee joint: Secondary | ICD-10-CM | POA: Insufficient documentation

## 2016-02-22 DIAGNOSIS — Z79891 Long term (current) use of opiate analgesic: Secondary | ICD-10-CM | POA: Diagnosis not present

## 2016-02-22 DIAGNOSIS — Z79899 Other long term (current) drug therapy: Secondary | ICD-10-CM | POA: Insufficient documentation

## 2016-02-22 DIAGNOSIS — I130 Hypertensive heart and chronic kidney disease with heart failure and stage 1 through stage 4 chronic kidney disease, or unspecified chronic kidney disease: Secondary | ICD-10-CM | POA: Insufficient documentation

## 2016-02-22 DIAGNOSIS — I4891 Unspecified atrial fibrillation: Secondary | ICD-10-CM | POA: Diagnosis not present

## 2016-02-22 DIAGNOSIS — I481 Persistent atrial fibrillation: Secondary | ICD-10-CM | POA: Diagnosis not present

## 2016-02-22 DIAGNOSIS — Z87891 Personal history of nicotine dependence: Secondary | ICD-10-CM | POA: Diagnosis not present

## 2016-02-22 DIAGNOSIS — I480 Paroxysmal atrial fibrillation: Secondary | ICD-10-CM | POA: Diagnosis not present

## 2016-02-22 DIAGNOSIS — E1122 Type 2 diabetes mellitus with diabetic chronic kidney disease: Secondary | ICD-10-CM | POA: Insufficient documentation

## 2016-02-22 DIAGNOSIS — I509 Heart failure, unspecified: Secondary | ICD-10-CM | POA: Diagnosis not present

## 2016-02-22 DIAGNOSIS — Z951 Presence of aortocoronary bypass graft: Secondary | ICD-10-CM | POA: Diagnosis not present

## 2016-02-22 DIAGNOSIS — N184 Chronic kidney disease, stage 4 (severe): Secondary | ICD-10-CM | POA: Diagnosis not present

## 2016-02-22 DIAGNOSIS — Z7901 Long term (current) use of anticoagulants: Secondary | ICD-10-CM | POA: Diagnosis not present

## 2016-02-22 DIAGNOSIS — Z7984 Long term (current) use of oral hypoglycemic drugs: Secondary | ICD-10-CM | POA: Insufficient documentation

## 2016-02-22 DIAGNOSIS — E119 Type 2 diabetes mellitus without complications: Secondary | ICD-10-CM | POA: Diagnosis not present

## 2016-02-22 DIAGNOSIS — I5032 Chronic diastolic (congestive) heart failure: Secondary | ICD-10-CM | POA: Diagnosis not present

## 2016-02-22 DIAGNOSIS — I110 Hypertensive heart disease with heart failure: Secondary | ICD-10-CM | POA: Diagnosis not present

## 2016-02-22 HISTORY — PX: CARDIOVERSION: SHX1299

## 2016-02-22 LAB — PROTIME-INR
INR: 2.76
Prothrombin Time: 29.8 seconds — ABNORMAL HIGH (ref 11.4–15.2)

## 2016-02-22 SURGERY — CARDIOVERSION
Anesthesia: Monitor Anesthesia Care

## 2016-02-22 SURGERY — CARDIOVERSION
Anesthesia: General

## 2016-02-22 MED ORDER — LIDOCAINE HCL (CARDIAC) 20 MG/ML IV SOLN
INTRAVENOUS | Status: DC | PRN
  Administered 2016-02-22: 30 mg via INTRAVENOUS

## 2016-02-22 MED ORDER — SODIUM CHLORIDE 0.9 % IV SOLN
INTRAVENOUS | Status: DC
  Administered 2016-02-22: 500 mL via INTRAVENOUS

## 2016-02-22 MED ORDER — PROPOFOL 10 MG/ML IV BOLUS
INTRAVENOUS | Status: DC | PRN
  Administered 2016-02-22: 60 mg via INTRAVENOUS

## 2016-02-22 MED ORDER — HYDROCORTISONE 1 % EX CREA
1.0000 | TOPICAL_CREAM | Freq: Three times a day (TID) | CUTANEOUS | Status: DC | PRN
Start: 2016-02-22 — End: 2016-02-22

## 2016-02-22 NOTE — Anesthesia Preprocedure Evaluation (Signed)
Anesthesia Evaluation  Patient identified by MRN, date of birth, ID band Patient awake    Reviewed: Allergy & Precautions, NPO status , Patient's Chart, lab work & pertinent test results  Airway Mallampati: II       Dental  (+) Dental Advisory Given   Pulmonary former smoker,    breath sounds clear to auscultation       Cardiovascular hypertension, Pt. on home beta blockers + CAD and +CHF  + dysrhythmias Atrial Fibrillation + pacemaker  Rhythm:Irregular Rate:Abnormal     Neuro/Psych negative neurological ROS  negative psych ROS   GI/Hepatic negative GI ROS, Neg liver ROS,   Endo/Other  diabetes, Type 2, Oral Hypoglycemic Agents  Renal/GU Renal disease  negative genitourinary   Musculoskeletal negative musculoskeletal ROS (+)   Abdominal   Peds negative pediatric ROS (+)  Hematology negative hematology ROS (+)   Anesthesia Other Findings   Reproductive/Obstetrics negative OB ROS                             Anesthesia Physical Anesthesia Plan  ASA: III  Anesthesia Plan: General   Post-op Pain Management:    Induction: Intravenous  Airway Management Planned: Natural Airway  Additional Equipment:   Intra-op Plan:   Post-operative Plan:   Informed Consent: I have reviewed the patients History and Physical, chart, labs and discussed the procedure including the risks, benefits and alternatives for the proposed anesthesia with the patient or authorized representative who has indicated his/her understanding and acceptance.     Plan Discussed with: CRNA  Anesthesia Plan Comments:         Anesthesia Quick Evaluation

## 2016-02-22 NOTE — Discharge Instructions (Signed)
Electrical Cardioversion, Care After °This sheet gives you information about how to care for yourself after your procedure. Your health care provider may also give you more specific instructions. If you have problems or questions, contact your health care provider. °What can I expect after the procedure? °After the procedure, it is common to have: °· Some redness on the skin where the shocks were given. °Follow these instructions at home: °· Do not drive for 24 hours if you were given a medicine to help you relax (sedative). °· Take over-the-counter and prescription medicines only as told by your health care provider. °· Ask your health care provider how to check your pulse. Check it often. °· Rest for 48 hours after the procedure or as told by your health care provider. °· Avoid or limit your caffeine use as told by your health care provider. °Contact a health care provider if: °· You feel like your heart is beating too quickly or your pulse is not regular. °· You have a serious muscle cramp that does not go away. °Get help right away if: °· You have discomfort in your chest. °· You are dizzy or you feel faint. °· You have trouble breathing or you are short of breath. °· Your speech is slurred. °· You have trouble moving an arm or leg on one side of your body. °· Your fingers or toes turn cold or blue. °This information is not intended to replace advice given to you by your health care provider. Make sure you discuss any questions you have with your health care provider. °Document Released: 12/16/2012 Document Revised: 09/29/2015 Document Reviewed: 09/01/2015 °Elsevier Interactive Patient Education © 2017 [employer] ° °

## 2016-02-22 NOTE — CV Procedure (Signed)
    Cardioversion Note  Christopher [name] PX:[id number] 1926-05-03  Procedure: DC Cardioversion Indications: atrial fibrillation  Procedure Details Consent: Obtained Time Out: Verified patient identification, verified procedure, site/side was marked, verified correct patient position, special equipment/implants available, Radiology Safety Procedures followed,  medications/allergies/relevent history reviewed, required imaging and test results available.  Performed  The patient has been on adequate anticoagulation.  The patient received IV propofol and lidocaine administered by anesthesia staff for sedation.  Synchronous cardioversion was performed at 120 joules.  The cardioversion was successful. Medtronic rep present and confirms AV pacing.   Complications: No apparent complications Patient did tolerate procedure well.   Christopher Dawley, MD, [facility] 02/22/2016, 9:59 AM

## 2016-02-22 NOTE — Interval H&P Note (Signed)
History and Physical Interval Note:  02/22/2016 9:44 AM  Christopher [name]  has presented today for surgery, with the diagnosis of a fib  The various methods of treatment have been discussed with the patient and family. After consideration of risks, benefits and other options for treatment, the patient has consented to  Procedure(s): CARDIOVERSION (N/A) as a surgical intervention .  The patient's history has been reviewed, patient examined, no change in status, stable for surgery.  I have reviewed the patient's chart and labs.  Questions were answered to the patient's satisfaction.     [name]

## 2016-02-22 NOTE — Anesthesia Postprocedure Evaluation (Signed)
Anesthesia Post Note  Patient: Christopher [name]  Procedure(s) Performed: Procedure(s) (LRB): CARDIOVERSION (N/A)  Patient location during evaluation: PACU Anesthesia Type: General Level of consciousness: awake and alert Pain management: pain level controlled Vital Signs Assessment: post-procedure vital signs reviewed and stable Respiratory status: spontaneous breathing, nonlabored ventilation, respiratory function stable and patient connected to nasal cannula oxygen Cardiovascular status: blood pressure returned to baseline and stable Postop Assessment: no signs of nausea or vomiting Anesthetic complications: no    Last Vitals:  Vitals:   02/22/16 1020 02/22/16 1030  BP: (!) 141/77 (!) 147/81  Pulse: 71 73  Resp: 12 12  Temp:      Last Pain:  Vitals:   02/22/16 1007  TempSrc: Oral                 Effie Berkshire

## 2016-02-22 NOTE — Transfer of Care (Signed)
Immediate Anesthesia Transfer of Care Note  Patient: Christopher [name]  Procedure(s) Performed: Procedure(s): CARDIOVERSION (N/A)  Patient Location: Endoscopy Unit  Anesthesia Type:General  Level of Consciousness: awake, patient cooperative and responds to stimulation  Airway & Oxygen Therapy: Patient Spontanous Breathing and Patient connected to nasal cannula oxygen  Post-op Assessment: Report given to RN and Post -op Vital signs reviewed and stable  Post vital signs: Reviewed and stable  Last Vitals:  Vitals:   02/22/16 1002 02/22/16 1003  BP: 130/75   Pulse:  82  Resp: 17 17    Last Pain:  Vitals:   02/22/16 0911  TempSrc: Oral         Complications: No apparent anesthesia complications

## 2016-02-22 NOTE — H&P (View-Only) (Signed)
 Hally, Dimetri C  Date of visit:  02/12/2016 DOB:  10/24/1926    Age:  82 yrs. Medical record number:  [id number]     Account number:  [id number] Primary Care Provider: AVVA, RAVISANKAR R ____________________________ CURRENT DIAGNOSES  1. CAD Native without angina  2. Chronic diastolic (congestive) heart failure  3. Paroxysmal atrial fibrillation  4. Chronic kidney disease, stage 4 (severe)  5. Hypertensive heart disease without heart failure  6. Long term (current) use of anticoagulants  7. Type 2 Diabetes Mellitus With Diabetic Chronic Kidney Disease  8. Hyperlipidemia  9. Presence of cardiac pacemaker  10. Presence of aortocoronary bypass graft  11. Dyspnea ____________________________ ALLERGIES  Atorvastatin, Joint aches  Hydrocodone, Intolerance-vomiting  Pravastatin, Joint aches  Rosuvastatin, Aches  Tramadol, Nausea ____________________________ MEDICATIONS  1. Fish Oil 1,000 mg Capsule, BID  2. glimepiride 1 mg Tablet, 1 p.o. daily  3. folic acid 400 mcg Tablet, 1 p.o. q.d.  4. horse chestnut 300 mg Capsule, 1 p.o. daily  5. multivitamin Capsule, 1 p.o. q.d.  6. warfarin 5 mg tablet, 1 p.o. daily or as directed  7. Glucosamine 500 mg tablet, 3 tablets BID  8. Miralax 17 gram oral powder packet, 1 p.o. daily  9. nitroglycerin 0.4 mg sublingual tablet, PRN  10. metoprolol succinate ER 25 mg tablet,extended release 24 hr, 1 p.o. daily  11. amiodarone 200 mg tablet, BID  12. furosemide 80 mg tablet, 2 tablets BID or as directed  13. hydrocodone 5 mg-acetaminophen 325 mg tablet, PRN ____________________________ HISTORY OF PRESENT ILLNESS  Patient returns for cardiac followup. The patient has a history of coronary artery disease with previous bypass grafting and also has had recurrent atrial fibrillation. He recently gone back into atrial fibrillation with worsening of heart failure symptoms and his amiodarone had been increased to twice daily. He on his own and reduced back to  once a day but we recently increased it back to twice daily and he returns today for followup. His son our has been therapeutic. He has had some diuresis since he was previously here. He has known stage IV 5 chronic kidney disease but attempts at placing a fistula has been difficult and as a result he no longer has plans to have a fistula placed prior to needing dialysis. It was thought that they would just place a catheter at the time of the procedure. He is quite symptomatic in atrial fibrillation and at the present time it is recommended that he have a trial of repeat cardioversion after he has been increased on his amiodarone to see if he would maintain sinus rhythm since it helps him symptomatically. He does have a known permanent pacemaker. Echocardiogram recently showed moderate left atrial enlargement with preserved LV systolic function. ____________________________ PAST HISTORY  Past Medical Illnesses:  hypertension, DM-non-insulin dependent, hyperlipidemia, lumbar disc disease, BPH, chronic kidney disease Stage 4, shingles, obesity, renovascular hypertension, prostate cancer treated with hormonal treatments;  Cardiovascular Illnesses:  CAD, sinus node dysfunction, atrial fibrillation, CHF diastolic;  Infectious Diseases:  no previous history of significant infectious diseases;  Surgical Procedures:  CABG w LIMA to LAD, SVG to OM-OM2 05/05/01 [facility]rickson, appendectomy, lumbar laminectomy x 2, hip repair right;  Trauma History:  no previous history of significant trauma;  NYHA Classification:  II;  Canadian Angina Classification:  Class 0: Asymptomatic;  Cardiology Procedures-Invasive:  cardiac cath (left) October 2011, Medtronic pacemaker implant May 2012, cardioversion May 2013, cardioversion May 2014, cardioversion 2017;  Cardiology Procedures-Noninvasive:  treadmill cardiolite October   2010, event monitor September 2011, echocardiogram May 2015;  Cardiac Cath Results:  60% left main, occluded  proximal  LAD, widely patent OM 1 OM 2 SVG, widely patent RCA, widely patent LAD LIMA graft;  Peripheral Vascular Procedures:  no previous invasive peripheral vascular procedures.;  LVEF of 55% documented via echocardiogram on 01/16/2015,  CHADS Score:  3,  ____________________________ SOCIAL HISTORY Alcohol Use:  does not use alcohol;  Smoking:  used to smoke but quit 1970;  Diet:  regular diet;  Lifestyle:  widowed and from first wife and remarried second wife;  Exercise:  exercise is limited due to physical disability;  Occupation:  retired and auto mechanic and body shop person;  Residence:  lives with wife;   ____________________________ PHYSICAL EXAMINATION VITAL SIGNS  Blood Pressure:  130/70 Sitting, Left arm, regular cuff  , 140/80 Standing, Left arm and large cuff   Pulse:  80/min. Weight:  227.00 lbs. Height:  74"BMI: 29  Constitutional:  pleasant white male in no acute distress walks with walker Skin:  scattered hemangiomas, scattered sebaceous cysts Head:  normocephalic, normal hair pattern, no masses or tenderness Neck:  supple, without massess. No JVD, thyromegaly or carotid bruits. Carotid upstroke normal. Chest:  normal symmetry, clear to auscultation., healed median sternotomy scar Cardiac:  regular rhythm, normal S1 and S2, No S3 or S4, no murmurs, gallops or rubs detected. Peripheral Pulses:  the femoral,dorsalis pedis, and posterior tibial pulses are full and equal bilaterally with no bruits auscultated. Extremities & Back:  well healed saphenous vein donor site LLE, no edema present Neurological:  no gross motor or sensory deficits noted, affect appropriate, oriented x3. ____________________________ MOST RECENT LIPID PANEL 02/10/15  CHOL TOTL 204 mg/dl, LDL 135 NM, HDL 43 mg/dl, TRIGLYCER 132 mg/dl, ALT 21 u/l, ALK PHOS 53 u/l, CHOL/HDL 4.7 (Calc) and AST 25 u/l ____________________________ IMPRESSIONS/PLAN  1. Persistent atrial fibrillation symptomatic 2. Long-term  anticoagulation with warfarin 3. CAD with previous bypass grafting with no angina 4. Chronic diastolic heart failure 5. Stage 4-5 chronic kidney disease  Recommendations:  The patient will be admitted for same-day cardioversion. Lab work was done his primary care doctor's office earlier this morning.Cardioversion discussed with the patient including risks of stroke, arrhythmia, death, or anesthesia risks. The patient understands and is willing to proceed.. procedure will be done at the hospital by a colleague from [facility] in light of my recent knee replacement surgery and inability to go to the hospital.  ____________________________ TODAYS ORDERS  1. Coag Clinic Visit: Coag OV 2 weeks                       ____________________________ Cardiology Physician:  W. Spencer Ayven Glasco, Jr. MD [facility]    

## 2016-02-26 ENCOUNTER — Encounter (HOSPITAL_COMMUNITY): Payer: Self-pay | Admitting: Cardiology

## 2016-02-26 DIAGNOSIS — D631 Anemia in chronic kidney disease: Secondary | ICD-10-CM | POA: Diagnosis not present

## 2016-02-26 DIAGNOSIS — N184 Chronic kidney disease, stage 4 (severe): Secondary | ICD-10-CM | POA: Diagnosis not present

## 2016-02-26 DIAGNOSIS — Z6832 Body mass index (BMI) 32.0-32.9, adult: Secondary | ICD-10-CM | POA: Diagnosis not present

## 2016-02-26 DIAGNOSIS — E1129 Type 2 diabetes mellitus with other diabetic kidney complication: Secondary | ICD-10-CM | POA: Diagnosis not present

## 2016-02-26 DIAGNOSIS — Z6831 Body mass index (BMI) 31.0-31.9, adult: Secondary | ICD-10-CM | POA: Diagnosis not present

## 2016-02-26 DIAGNOSIS — I129 Hypertensive chronic kidney disease with stage 1 through stage 4 chronic kidney disease, or unspecified chronic kidney disease: Secondary | ICD-10-CM | POA: Diagnosis not present

## 2016-02-28 ENCOUNTER — Encounter (HOSPITAL_COMMUNITY)
Admission: RE | Admit: 2016-02-28 | Discharge: 2016-02-28 | Disposition: A | Discharge status: Home / Self Care | Payer: Medicare Other | Source: Ambulatory Visit | Attending: Nephrology | Admitting: Nephrology

## 2016-02-28 DIAGNOSIS — L570 Actinic keratosis: Secondary | ICD-10-CM | POA: Diagnosis not present

## 2016-02-28 DIAGNOSIS — N184 Chronic kidney disease, stage 4 (severe): Secondary | ICD-10-CM | POA: Insufficient documentation

## 2016-02-28 DIAGNOSIS — D638 Anemia in other chronic diseases classified elsewhere: Secondary | ICD-10-CM | POA: Diagnosis not present

## 2016-02-28 DIAGNOSIS — Z85828 Personal history of other malignant neoplasm of skin: Secondary | ICD-10-CM | POA: Diagnosis not present

## 2016-02-28 MED ORDER — DARBEPOETIN ALFA 100 MCG/0.5ML IJ SOSY
100.0000 ug | PREFILLED_SYRINGE | INTRAMUSCULAR | Status: DC
  Administered 2016-02-28: 13:00:00 100 ug via SUBCUTANEOUS

## 2016-02-28 MED ORDER — DARBEPOETIN ALFA 100 MCG/0.5ML IJ SOSY
PREFILLED_SYRINGE | INTRAMUSCULAR | Status: AC
  Filled 2016-02-28: qty 0.5

## 2016-02-29 LAB — POCT HEMOGLOBIN-HEMACUE: Hemoglobin: 11.7 g/dL — ABNORMAL LOW (ref 13.0–17.0)

## 2016-03-01 DIAGNOSIS — I119 Hypertensive heart disease without heart failure: Secondary | ICD-10-CM | POA: Diagnosis not present

## 2016-03-01 DIAGNOSIS — I5032 Chronic diastolic (congestive) heart failure: Secondary | ICD-10-CM | POA: Diagnosis not present

## 2016-03-01 DIAGNOSIS — I2510 Atherosclerotic heart disease of native coronary artery without angina pectoris: Secondary | ICD-10-CM | POA: Diagnosis not present

## 2016-03-01 DIAGNOSIS — Z950 Presence of cardiac pacemaker: Secondary | ICD-10-CM | POA: Diagnosis not present

## 2016-03-01 DIAGNOSIS — E785 Hyperlipidemia, unspecified: Secondary | ICD-10-CM | POA: Diagnosis not present

## 2016-03-01 DIAGNOSIS — Z951 Presence of aortocoronary bypass graft: Secondary | ICD-10-CM | POA: Diagnosis not present

## 2016-03-01 DIAGNOSIS — E1122 Type 2 diabetes mellitus with diabetic chronic kidney disease: Secondary | ICD-10-CM | POA: Diagnosis not present

## 2016-03-01 DIAGNOSIS — N184 Chronic kidney disease, stage 4 (severe): Secondary | ICD-10-CM | POA: Diagnosis not present

## 2016-03-01 DIAGNOSIS — Z7901 Long term (current) use of anticoagulants: Secondary | ICD-10-CM | POA: Diagnosis not present

## 2016-03-01 DIAGNOSIS — R0602 Shortness of breath: Secondary | ICD-10-CM | POA: Diagnosis not present

## 2016-03-01 DIAGNOSIS — I480 Paroxysmal atrial fibrillation: Secondary | ICD-10-CM | POA: Diagnosis not present

## 2016-03-07 DIAGNOSIS — E1122 Type 2 diabetes mellitus with diabetic chronic kidney disease: Secondary | ICD-10-CM | POA: Diagnosis not present

## 2016-03-07 DIAGNOSIS — E785 Hyperlipidemia, unspecified: Secondary | ICD-10-CM | POA: Diagnosis not present

## 2016-03-07 DIAGNOSIS — I5032 Chronic diastolic (congestive) heart failure: Secondary | ICD-10-CM | POA: Diagnosis not present

## 2016-03-07 DIAGNOSIS — Z7901 Long term (current) use of anticoagulants: Secondary | ICD-10-CM | POA: Diagnosis not present

## 2016-03-07 DIAGNOSIS — N184 Chronic kidney disease, stage 4 (severe): Secondary | ICD-10-CM | POA: Diagnosis not present

## 2016-03-07 DIAGNOSIS — I119 Hypertensive heart disease without heart failure: Secondary | ICD-10-CM | POA: Diagnosis not present

## 2016-03-07 DIAGNOSIS — Z951 Presence of aortocoronary bypass graft: Secondary | ICD-10-CM | POA: Diagnosis not present

## 2016-03-07 DIAGNOSIS — Z950 Presence of cardiac pacemaker: Secondary | ICD-10-CM | POA: Diagnosis not present

## 2016-03-07 DIAGNOSIS — R0602 Shortness of breath: Secondary | ICD-10-CM | POA: Diagnosis not present

## 2016-03-07 DIAGNOSIS — I2510 Atherosclerotic heart disease of native coronary artery without angina pectoris: Secondary | ICD-10-CM | POA: Diagnosis not present

## 2016-03-07 DIAGNOSIS — I480 Paroxysmal atrial fibrillation: Secondary | ICD-10-CM | POA: Diagnosis not present

## 2016-03-12 DIAGNOSIS — I119 Hypertensive heart disease without heart failure: Secondary | ICD-10-CM | POA: Diagnosis not present

## 2016-03-12 DIAGNOSIS — R0602 Shortness of breath: Secondary | ICD-10-CM | POA: Diagnosis not present

## 2016-03-12 DIAGNOSIS — I5032 Chronic diastolic (congestive) heart failure: Secondary | ICD-10-CM | POA: Diagnosis not present

## 2016-03-12 DIAGNOSIS — E785 Hyperlipidemia, unspecified: Secondary | ICD-10-CM | POA: Diagnosis not present

## 2016-03-12 DIAGNOSIS — Z951 Presence of aortocoronary bypass graft: Secondary | ICD-10-CM | POA: Diagnosis not present

## 2016-03-12 DIAGNOSIS — Z7901 Long term (current) use of anticoagulants: Secondary | ICD-10-CM | POA: Diagnosis not present

## 2016-03-12 DIAGNOSIS — I2510 Atherosclerotic heart disease of native coronary artery without angina pectoris: Secondary | ICD-10-CM | POA: Diagnosis not present

## 2016-03-12 DIAGNOSIS — E1122 Type 2 diabetes mellitus with diabetic chronic kidney disease: Secondary | ICD-10-CM | POA: Diagnosis not present

## 2016-03-12 DIAGNOSIS — Z950 Presence of cardiac pacemaker: Secondary | ICD-10-CM | POA: Diagnosis not present

## 2016-03-12 DIAGNOSIS — I480 Paroxysmal atrial fibrillation: Secondary | ICD-10-CM | POA: Diagnosis not present

## 2016-03-12 DIAGNOSIS — N184 Chronic kidney disease, stage 4 (severe): Secondary | ICD-10-CM | POA: Diagnosis not present

## 2016-03-20 ENCOUNTER — Encounter (HOSPITAL_COMMUNITY)
Admission: RE | Admit: 2016-03-20 | Discharge: 2016-03-20 | Disposition: A | Discharge status: Home / Self Care | Payer: Medicare Other | Source: Ambulatory Visit | Attending: Nephrology | Admitting: Nephrology

## 2016-03-20 DIAGNOSIS — N184 Chronic kidney disease, stage 4 (severe): Secondary | ICD-10-CM | POA: Diagnosis not present

## 2016-03-20 DIAGNOSIS — D638 Anemia in other chronic diseases classified elsewhere: Secondary | ICD-10-CM | POA: Diagnosis not present

## 2016-03-20 LAB — IRON AND TIBC
IRON: 121 ug/dL (ref 45–182)
Saturation Ratios: 37 % (ref 17.9–39.5)
TIBC: 323 ug/dL (ref 250–450)
UIBC: 202 ug/dL

## 2016-03-20 LAB — POCT HEMOGLOBIN-HEMACUE: HEMOGLOBIN: 11.7 g/dL — AB (ref 13.0–17.0)

## 2016-03-20 LAB — FERRITIN: Ferritin: 670 ng/mL — ABNORMAL HIGH (ref 24–336)

## 2016-03-20 MED ORDER — DARBEPOETIN ALFA 100 MCG/0.5ML IJ SOSY
100.0000 ug | PREFILLED_SYRINGE | INTRAMUSCULAR | Status: DC
Start: 2016-03-20 — End: 2016-03-21
  Administered 2016-03-20: 100 ug via SUBCUTANEOUS

## 2016-03-20 MED ORDER — DARBEPOETIN ALFA 100 MCG/0.5ML IJ SOSY
PREFILLED_SYRINGE | INTRAMUSCULAR | Status: AC
  Administered 2016-03-20: 13:00:00 100 ug via SUBCUTANEOUS
  Filled 2016-03-20: qty 0.5

## 2016-03-25 ENCOUNTER — Telehealth: Payer: Self-pay | Admitting: Surgery

## 2016-03-25 ENCOUNTER — Other Ambulatory Visit: Payer: Self-pay | Admitting: Vascular Surgery

## 2016-03-25 DIAGNOSIS — Z01810 Encounter for preprocedural cardiovascular examination: Secondary | ICD-10-CM

## 2016-03-25 DIAGNOSIS — N184 Chronic kidney disease, stage 4 (severe): Secondary | ICD-10-CM

## 2016-03-25 NOTE — Telephone Encounter (Signed)
-----   Message from Serafina Mitchell, MD sent at 03/24/2016 11:42 AM EST ----- Regarding: RE: Phone Call Request Please schedule the patient to be evaluated for right arm fistula vs graft by any provider ----- Message ----- From: Lujean Amel Sent: 03/22/2016   9:58 AM To: Serafina Mitchell, MD Subject: Phone Call Request                             Dr.Brabham, Dr. Mercy Moore called requesting to speak with you regarding this mutual patient. He can be reached on his cell phone [phone].

## 2016-03-25 NOTE — Telephone Encounter (Signed)
Called patient and left message to notify him of scheduled appt on 04/25/16 at 9am for vein mapping and to see CEF at 10:15am per Dr.Brabham's instructions via staff message. I will also mail him an appt letter.  awt

## 2016-03-26 DIAGNOSIS — R0602 Shortness of breath: Secondary | ICD-10-CM | POA: Diagnosis not present

## 2016-03-26 DIAGNOSIS — N184 Chronic kidney disease, stage 4 (severe): Secondary | ICD-10-CM | POA: Diagnosis not present

## 2016-03-26 DIAGNOSIS — I5032 Chronic diastolic (congestive) heart failure: Secondary | ICD-10-CM | POA: Diagnosis not present

## 2016-03-26 DIAGNOSIS — E785 Hyperlipidemia, unspecified: Secondary | ICD-10-CM | POA: Diagnosis not present

## 2016-03-26 DIAGNOSIS — E1122 Type 2 diabetes mellitus with diabetic chronic kidney disease: Secondary | ICD-10-CM | POA: Diagnosis not present

## 2016-03-26 DIAGNOSIS — I480 Paroxysmal atrial fibrillation: Secondary | ICD-10-CM | POA: Diagnosis not present

## 2016-03-26 DIAGNOSIS — I119 Hypertensive heart disease without heart failure: Secondary | ICD-10-CM | POA: Diagnosis not present

## 2016-03-26 DIAGNOSIS — Z951 Presence of aortocoronary bypass graft: Secondary | ICD-10-CM | POA: Diagnosis not present

## 2016-03-26 DIAGNOSIS — I2510 Atherosclerotic heart disease of native coronary artery without angina pectoris: Secondary | ICD-10-CM | POA: Diagnosis not present

## 2016-03-26 DIAGNOSIS — Z7901 Long term (current) use of anticoagulants: Secondary | ICD-10-CM | POA: Diagnosis not present

## 2016-03-26 DIAGNOSIS — Z950 Presence of cardiac pacemaker: Secondary | ICD-10-CM | POA: Diagnosis not present

## 2016-04-10 ENCOUNTER — Encounter (HOSPITAL_COMMUNITY)
Admission: RE | Admit: 2016-04-10 | Discharge: 2016-04-10 | Disposition: A | Discharge status: Home / Self Care | Payer: Medicare Other | Source: Ambulatory Visit | Attending: Nephrology | Admitting: Nephrology

## 2016-04-10 DIAGNOSIS — Z951 Presence of aortocoronary bypass graft: Secondary | ICD-10-CM | POA: Diagnosis not present

## 2016-04-10 DIAGNOSIS — D638 Anemia in other chronic diseases classified elsewhere: Secondary | ICD-10-CM | POA: Diagnosis not present

## 2016-04-10 DIAGNOSIS — Z950 Presence of cardiac pacemaker: Secondary | ICD-10-CM | POA: Diagnosis not present

## 2016-04-10 DIAGNOSIS — I5032 Chronic diastolic (congestive) heart failure: Secondary | ICD-10-CM | POA: Diagnosis not present

## 2016-04-10 DIAGNOSIS — N184 Chronic kidney disease, stage 4 (severe): Secondary | ICD-10-CM | POA: Diagnosis not present

## 2016-04-10 DIAGNOSIS — Z7901 Long term (current) use of anticoagulants: Secondary | ICD-10-CM | POA: Diagnosis not present

## 2016-04-10 DIAGNOSIS — E1122 Type 2 diabetes mellitus with diabetic chronic kidney disease: Secondary | ICD-10-CM | POA: Diagnosis not present

## 2016-04-10 DIAGNOSIS — E785 Hyperlipidemia, unspecified: Secondary | ICD-10-CM | POA: Diagnosis not present

## 2016-04-10 DIAGNOSIS — I119 Hypertensive heart disease without heart failure: Secondary | ICD-10-CM | POA: Diagnosis not present

## 2016-04-10 DIAGNOSIS — I480 Paroxysmal atrial fibrillation: Secondary | ICD-10-CM | POA: Diagnosis not present

## 2016-04-10 DIAGNOSIS — R0602 Shortness of breath: Secondary | ICD-10-CM | POA: Diagnosis not present

## 2016-04-10 DIAGNOSIS — I2510 Atherosclerotic heart disease of native coronary artery without angina pectoris: Secondary | ICD-10-CM | POA: Diagnosis not present

## 2016-04-10 LAB — POCT HEMOGLOBIN-HEMACUE: HEMOGLOBIN: 10.9 g/dL — AB (ref 13.0–17.0)

## 2016-04-10 MED ORDER — DARBEPOETIN ALFA 100 MCG/0.5ML IJ SOSY
PREFILLED_SYRINGE | INTRAMUSCULAR | Status: AC
  Filled 2016-04-10: qty 0.5

## 2016-04-10 MED ORDER — DARBEPOETIN ALFA 100 MCG/0.5ML IJ SOSY
100.0000 ug | PREFILLED_SYRINGE | INTRAMUSCULAR | Status: DC
  Administered 2016-04-10: 13:00:00 100 ug via SUBCUTANEOUS

## 2016-04-18 ENCOUNTER — Encounter: Payer: Self-pay | Admitting: Vascular Surgery

## 2016-04-25 ENCOUNTER — Other Ambulatory Visit: Payer: Self-pay

## 2016-04-25 ENCOUNTER — Ambulatory Visit (INDEPENDENT_AMBULATORY_CARE_PROVIDER_SITE_OTHER): Payer: Medicare Other | Admitting: Vascular Surgery

## 2016-04-25 ENCOUNTER — Ambulatory Visit (HOSPITAL_COMMUNITY)
Admission: RE | Admit: 2016-04-25 | Discharge: 2016-04-25 | Disposition: A | Discharge status: Home / Self Care | Payer: Medicare Other | Source: Ambulatory Visit | Attending: Vascular Surgery | Admitting: Vascular Surgery

## 2016-04-25 ENCOUNTER — Encounter: Payer: Self-pay | Admitting: Vascular Surgery

## 2016-04-25 ENCOUNTER — Ambulatory Visit (INDEPENDENT_AMBULATORY_CARE_PROVIDER_SITE_OTHER)
Admission: RE | Admit: 2016-04-25 | Discharge: 2016-04-25 | Disposition: A | Discharge status: Home / Self Care | Payer: Medicare Other | Source: Ambulatory Visit | Attending: Vascular Surgery | Admitting: Vascular Surgery

## 2016-04-25 VITALS — BP 126/82 | HR 75 | Temp 97.0°F | Resp 18 | Ht 74.0 in | Wt 236.1 lb

## 2016-04-25 DIAGNOSIS — N184 Chronic kidney disease, stage 4 (severe): Secondary | ICD-10-CM | POA: Insufficient documentation

## 2016-04-25 DIAGNOSIS — N186 End stage renal disease: Secondary | ICD-10-CM | POA: Diagnosis not present

## 2016-04-25 DIAGNOSIS — Z992 Dependence on renal dialysis: Secondary | ICD-10-CM

## 2016-04-25 DIAGNOSIS — Z01810 Encounter for preprocedural cardiovascular examination: Secondary | ICD-10-CM | POA: Insufficient documentation

## 2016-04-25 NOTE — Progress Notes (Addendum)
Referring Physician: Mercy Moore  Patient name: Christopher [name] MRN: [id number] DOB: 1927/01/09 Sex: male  REASON FOR CONSULT: Hemodialysis access  HPI: Christopher [name] is a 84 y.o. male who currently is not on hemodialysis but has previously had a left radiocephalic fistula which occluded. He subsequently had a left brachiocephalic fistula and developed venous hypertension. This was ligated. He now presents for placement of a new hemodialysis access. He is right-handed. He has a pacemaker on the left side. He has chronic atrial fibrillation and is on Coumadin for this. He has not been bridged in the past. Other medical problems include coronary artery disease diabetes hypertension all of which are currently stable.  Past Medical History:  Diagnosis Date  . Atrial fibrillation ([place]) 08/07/2011   CHADS score  2 CHADS2 VASC score 4  Onset winter of 2013 Cardioversion May 2013 Reversion to a fib 3/14 amiodarone begun 07/02/12  Repeat cardioversion Jul 16, 2012    . BPH (benign prostatic hyperplasia)   . CAD (coronary artery disease)    followed by Dr. Wynonia Lawman  . CAD (coronary artery disease) 03/06/2010   CABG with LIMA to LAD, SVG to OM-OM2  05/05/01 Dr. Roxan Hockey  Cardiac Cath 12/2009 60% left main, occluded LAD, Widely patent SVG to OM1 and 2, patent LIMA, widely patent RCA    . Cardiac pacemaker in situ    07/26/10  Inserted for chronotropic incompetence and dyspnea  RA lead  Medtronic 5076  serial number [id number]   RV lead  Medtronic 5076  serial number [id number] Medtronic Adapta RL pulse generator, SN  [id number]    . CHF (congestive heart failure) ([place])   . Chronic kidney disease    kidneys function at "20 %" followed by Dr. Mercy Moore  . Diabetes mellitus without complication ([place])    Type II  . Dysrhythmia   . History of skin cancer   . HTN (hypertension)   . Hypertensive heart disease without CHF   . Lumbar disc disease   . Lumbar disc disease 1992  . Pacemaker    Medtronic-dual-chamber-DOI 2012  . Prostate cancer ([place])   . Shortness of breath dyspnea    with exertion  . Sinus node dysfunction ([facility])    symptomatic bradycardia   Past Surgical History:  Procedure Laterality Date  . APPENDECTOMY  1980  . AV FISTULA PLACEMENT Left 11/08/2015   Procedure: ARTERIOVENOUS (AV) FISTULA CREATION;  Surgeon: [name], MD;  Location: [place];  Service: Vascular;  Laterality: Left;  . AV FISTULA PLACEMENT Left 01/03/2016   Procedure: Creation of BRACHIOCEPHALIC ARTERIOVENOUS (AV) FISTULA Left arm;  Surgeon: [name], MD;  Location: [place];  Service: Vascular;  Laterality: Left;  . CARDIAC CATHETERIZATION  2003  . CARDIOVERSION  08/08/2011   Procedure: CARDIOVERSION;  Surgeon: Jacolyn Reedy, MD;  Location: [place];  Service: Cardiovascular;  Laterality: N/A;  . CARDIOVERSION N/A 07/16/2012   Procedure: CARDIOVERSION;  Surgeon: Jacolyn Reedy, MD;  Location: [facility] ENDOSCOPY;  Service: Cardiovascular;  Laterality: N/A;  . CARDIOVERSION N/A 02/27/2015   Procedure: CARDIOVERSION;  Surgeon: Jacolyn Reedy, MD;  Location: [facility] ENDOSCOPY;  Service: Cardiovascular;  Laterality: N/A;  . CARDIOVERSION N/A 09/21/2015   Procedure: CARDIOVERSION;  Surgeon: Jacolyn Reedy, MD;  Location: [facility] ENDOSCOPY;  Service: Cardiovascular;  Laterality: N/A;  . CARDIOVERSION N/A 02/22/2016   Procedure: CARDIOVERSION;  Surgeon: Dorothy Spark, MD;  Location: [facility] ENDOSCOPY;  Service: Cardiovascular;  Laterality: N/A;  . cataracts removed    .  COLONOSCOPY    . CORONARY ARTERY BYPASS GRAFT  2003   x3  . CYSTOSCOPY WITH BIOPSY N/A 06/21/2013   Procedure: CYSTOSCOPY WITH BIOPSY;  Surgeon: Molli Hazard, MD;  Location: [facility];  Service: Urology;  Laterality: N/A;    TUR RESECTION OF POLYP BILATERAL RETROGRADE PYELOGRAM BLADDER BIOPSY    . EYE SURGERY Bilateral    Cataracts  . HIP PINNING,CANNULATED Right 06/30/2014   Procedure: CANNULATED HIP PINNING;  Surgeon: Renette Butters, MD;  Location: [place];  Service: Orthopedics;  Laterality: Right;  . LIGATION OF ARTERIOVENOUS  FISTULA Left 01/17/2016   Procedure: LIGATION OF ARTERIOVENOUS  FISTULA UPPER ARM FISTULA;  Surgeon: [name], MD;  Location: [place];  Service: Vascular;  Laterality: Left;  . LUMBAR LAMINECTOMY  11/01  . PACEMAKER INSERTION  2012  . PROSTATE BIOPSY N/A 06/21/2013   Procedure: BIOPSY TRANSRECTAL ULTRASONIC PROSTATE (TUBP);  Surgeon: Molli Hazard, MD;  [facility]: [facility];  Service: Urology;  Laterality: N/A;  PROSTATE NERVE BLOCK    Family History  Problem Relation Age of Onset  . Cancer Mother     breast  . Cancer Father     prostate, esophagus    SOCIAL HISTORY: Social History   Social History  . Marital status: Married    Spouse name: N/A  . Number of children: 3  . Years of education: N/A   Occupational History  . retired    Social History Main Topics  . Smoking status: Former Smoker    Packs/day: 0.75    Years: 24.00    Types: Cigarettes    Quit date: 03/12/1968  . Smokeless tobacco: Former Systems developer    Types: Snuff, Chew    Quit date: 03/11/1968  . Alcohol use No  . Drug use: No  . Sexual activity: Not Currently   Other Topics Concern  . Not on file   Social History Narrative  . No narrative on file    Allergies  Allergen Reactions  . Crestor [Rosuvastatin] Other (See Comments)    "ACHING JOINTS"  . Lipitor [Atorvastatin] Other (See Comments)    "ACHING JOINTS"  . Statins Other (See Comments)    "ACHING JOINTS"  . Penicillins Rash and Other (See Comments)    Has patient had a PCN reaction causing immediate rash, facial/tongue/throat swelling, SOB or lightheadedness with hypotension: yes Has patient had a PCN reaction causing severe rash involving mucus membranes or skin necrosis: no Has patient had a PCN reaction that required hospitalization no Has patient had a PCN reaction occurring within the last 10 years: no If all of the above answers are  "NO", then may proceed with Cephalosporin use.   . Tramadol Nausea Only    Current Outpatient Prescriptions  Medication Sig Dispense Refill  . amiodarone (PACERONE) 200 MG tablet Take 1 tablet (200 mg total) by mouth every morning.    . Darbepoetin Alfa (ARANESP) 100 MCG/0.5ML SOSY injection Inject 100 mcg into the skin every 21 ( twenty-one) days.    . folic acid (FOLVITE) A999333 MCG tablet Take 400 mcg by mouth daily.      . furosemide (LASIX) 80 MG tablet Take 1 tablet (80 mg total) by mouth 2 (two) times daily.    Marland Kitchen glimepiride (AMARYL) 1 MG tablet Take 1 tablet (1 mg total) by mouth daily before breakfast. 30 tablet 1  . Glucosamine HCl 1500 MG TABS Take 1,500 mg by mouth 2 (two) times daily.     Marland Kitchen  Horse Chestnut 300 MG CAPS Take 300 mg by mouth daily.     Marland Kitchen HYDROcodone-acetaminophen (NORCO/VICODIN) 5-325 MG tablet Take 1 tablet by mouth every 6 (six) hours as needed. For pain. 6 tablet 0  . metoprolol succinate (TOPROL-XL) 25 MG 24 hr tablet Take 25 mg by mouth daily.    . Multiple Vitamin (MULTIVITAMIN WITH MINERALS) TABS tablet Take 1 tablet by mouth daily.    . Omega-3 Fatty Acids (FISH OIL) 1200 MG CAPS Take 1,200 mg by mouth 2 (two) times daily.     Marland Kitchen warfarin (COUMADIN) 4 MG tablet Take 1 tablet (4 mg total) by mouth daily at 6 PM.    . carboxymethylcellulose (REFRESH PLUS) 0.5 % SOLN Place 1 drop into both eyes 3 (three) times daily as needed (for dry eyes.).    Marland Kitchen fluticasone (FLONASE) 50 MCG/ACT nasal spray Place 1-2 sprays into both nostrils daily as needed for allergies or rhinitis.    Marland Kitchen nitroGLYCERIN (NITROSTAT) 0.4 MG SL tablet Place 0.4 mg under the tongue every 5 (five) minutes as needed for chest pain.    . Polyethylene Glycol 3350 (MIRALAX PO) Take 17 g by mouth daily.      No current facility-administered medications for this visit.     ROS:   General:  No weight loss, Fever, chills  HEENT: No recent headaches, no nasal bleeding, no visual changes, no sore  throat  Neurologic: No dizziness, blackouts, seizures. No recent symptoms of stroke or mini- stroke. No recent episodes of slurred speech, or temporary blindness.  Cardiac: No recent episodes of chest pain/pressure, no shortness of breath at rest.  No shortness of breath with exertion.  Denies history of atrial fibrillation or irregular heartbeat  Vascular: No history of rest pain in feet.  No history of claudication.  No history of non-healing ulcer, No history of DVT   Pulmonary: No home oxygen, no productive cough, no hemoptysis,  No asthma or wheezing  Musculoskeletal:  [ ]  Arthritis, [ ]  Low back pain,  [ ]  Joint pain  Hematologic:No history of hypercoagulable state.  No history of easy bleeding.  No history of anemia  Gastrointestinal: No hematochezia or melena,  No gastroesophageal reflux, no trouble swallowing  Urinary: [X]  chronic Kidney disease, [ ]  on HD - [ ]  MWF or [ ]  TTHS, [ ]  Burning with urination, [ ]  Frequent urination, [ ]  Difficulty urinating;   Skin: No rashes  Psychological: No history of anxiety,  No history of depression   Physical Examination  Vitals:   04/25/16 0937  BP: 126/82  Pulse: 75  Resp: 18  Temp: 97 F (36.1 C)  TempSrc: Oral  SpO2: 96%  Weight: 236 lb 1.6 oz (107.1 kg)  Height: 6\' 2"  (1.88 m)    Body mass index is 30.31 kg/m.  General:  Alert and oriented, no acute distress HEENT: Normal Neck: No bruit or JVD Pulmonary: Clear to auscultation bilaterally Cardiac: Regular Rate and Rhythm without murmur Abdomen: Soft, non-tender, non-distended, no mass, no scars Skin: No rash Extremity Pulses:  2+ radial, brachial, femoral, dorsalis pedis, posterior tibial pulses bilaterally Musculoskeletal: No deformity or edema  Neurologic: Upper and lower extremity motor 5/5 and symmetric  DATA:  Patient had a vein mapping ultrasound which shows the right cephalic vein is 3 mm in diameter but dollops into the deeper system at the antecubital  level. Basilic vein on the right side is 3-5 mm. Radial artery was 3-1/2 mm in diameter.  ASSESSMENT:  Patient  needs long-term hemodialysis access.  PLAN:  Right radiocephalic AV fistula in the next few weeks. Risks benefits possible palpitations and procedure details were discussed the patient and his wife today including not limited to non-maturation of the fistula fistula thrombosis leading infection areas of numbness and tingling around the fistula. He understands and agrees to proceed.  We'll stop his Coumadin a few days perioperatively.   Ruta Hinds, MD [facility] [place] Office: [phone] Pager: [phone]

## 2016-04-26 DIAGNOSIS — I480 Paroxysmal atrial fibrillation: Secondary | ICD-10-CM | POA: Diagnosis not present

## 2016-04-26 DIAGNOSIS — I5032 Chronic diastolic (congestive) heart failure: Secondary | ICD-10-CM | POA: Diagnosis not present

## 2016-04-26 DIAGNOSIS — E1122 Type 2 diabetes mellitus with diabetic chronic kidney disease: Secondary | ICD-10-CM | POA: Diagnosis not present

## 2016-04-26 DIAGNOSIS — Z950 Presence of cardiac pacemaker: Secondary | ICD-10-CM | POA: Diagnosis not present

## 2016-04-26 DIAGNOSIS — E785 Hyperlipidemia, unspecified: Secondary | ICD-10-CM | POA: Diagnosis not present

## 2016-04-26 DIAGNOSIS — Z951 Presence of aortocoronary bypass graft: Secondary | ICD-10-CM | POA: Diagnosis not present

## 2016-04-26 DIAGNOSIS — N184 Chronic kidney disease, stage 4 (severe): Secondary | ICD-10-CM | POA: Diagnosis not present

## 2016-04-26 DIAGNOSIS — I2510 Atherosclerotic heart disease of native coronary artery without angina pectoris: Secondary | ICD-10-CM | POA: Diagnosis not present

## 2016-04-26 DIAGNOSIS — R0602 Shortness of breath: Secondary | ICD-10-CM | POA: Diagnosis not present

## 2016-04-26 DIAGNOSIS — Z7901 Long term (current) use of anticoagulants: Secondary | ICD-10-CM | POA: Diagnosis not present

## 2016-04-26 DIAGNOSIS — I119 Hypertensive heart disease without heart failure: Secondary | ICD-10-CM | POA: Diagnosis not present

## 2016-04-29 DIAGNOSIS — Z6832 Body mass index (BMI) 32.0-32.9, adult: Secondary | ICD-10-CM | POA: Diagnosis not present

## 2016-04-29 DIAGNOSIS — I129 Hypertensive chronic kidney disease with stage 1 through stage 4 chronic kidney disease, or unspecified chronic kidney disease: Secondary | ICD-10-CM | POA: Diagnosis not present

## 2016-04-29 DIAGNOSIS — Z6831 Body mass index (BMI) 31.0-31.9, adult: Secondary | ICD-10-CM | POA: Diagnosis not present

## 2016-04-29 DIAGNOSIS — N184 Chronic kidney disease, stage 4 (severe): Secondary | ICD-10-CM | POA: Diagnosis not present

## 2016-04-29 DIAGNOSIS — E1129 Type 2 diabetes mellitus with other diabetic kidney complication: Secondary | ICD-10-CM | POA: Diagnosis not present

## 2016-05-01 ENCOUNTER — Encounter (HOSPITAL_COMMUNITY)
Admission: RE | Admit: 2016-05-01 | Discharge: 2016-05-01 | Disposition: A | Discharge status: Home / Self Care | Payer: Medicare Other | Source: Ambulatory Visit | Attending: Nephrology | Admitting: Nephrology

## 2016-05-01 DIAGNOSIS — D638 Anemia in other chronic diseases classified elsewhere: Secondary | ICD-10-CM | POA: Diagnosis not present

## 2016-05-01 DIAGNOSIS — N184 Chronic kidney disease, stage 4 (severe): Secondary | ICD-10-CM | POA: Insufficient documentation

## 2016-05-01 LAB — IRON AND TIBC
Iron: 112 ug/dL (ref 45–182)
Saturation Ratios: 37 % (ref 17.9–39.5)
TIBC: 307 ug/dL (ref 250–450)
UIBC: 195 ug/dL

## 2016-05-01 LAB — FERRITIN: Ferritin: 505 ng/mL — ABNORMAL HIGH (ref 24–336)

## 2016-05-01 MED ORDER — DARBEPOETIN ALFA 100 MCG/0.5ML IJ SOSY
PREFILLED_SYRINGE | INTRAMUSCULAR | Status: AC
  Filled 2016-05-01: qty 0.5

## 2016-05-01 MED ORDER — DARBEPOETIN ALFA 100 MCG/0.5ML IJ SOSY
100.0000 ug | PREFILLED_SYRINGE | INTRAMUSCULAR | Status: DC
  Administered 2016-05-01: 100 ug via SUBCUTANEOUS

## 2016-05-02 LAB — POCT HEMOGLOBIN-HEMACUE: HEMOGLOBIN: 10.6 g/dL — AB (ref 13.0–17.0)

## 2016-05-13 ENCOUNTER — Encounter (HOSPITAL_COMMUNITY): Payer: Self-pay | Admitting: *Deleted

## 2016-05-13 MED ORDER — VANCOMYCIN HCL 10 G IV SOLR
1500.0000 mg | INTRAVENOUS | Status: AC
  Administered 2016-05-14: 1500 mg via INTRAVENOUS
  Filled 2016-05-13 (×2): qty 1500

## 2016-05-13 NOTE — Progress Notes (Addendum)
Spoke with pt for pre-op call. Pt has extensive cardiac history with CABG and pacemaker. Sees Dr. Wynonia Lawman. Pt is a type 2 diabetic. Last A1C was 5.5, pt not sure how long ago this was. Pt states he rarely checks his blood sugar at home. Did instruct pt to check his blood sugar in the AM when he gets up. If blood sugar is 70 or below, treat with 1/2 cup of clear juice (apple or cranberry) and recheck blood sugar 15 minutes after drinking juice. Pt voiced understanding.

## 2016-05-14 ENCOUNTER — Encounter (HOSPITAL_COMMUNITY): Payer: Self-pay | Admitting: *Deleted

## 2016-05-14 ENCOUNTER — Telehealth: Payer: Self-pay | Admitting: Vascular Surgery

## 2016-05-14 ENCOUNTER — Ambulatory Visit (HOSPITAL_COMMUNITY): Payer: Medicare Other | Admitting: Certified Registered Nurse Anesthetist

## 2016-05-14 ENCOUNTER — Ambulatory Visit (HOSPITAL_COMMUNITY)
Admission: RE | Admit: 2016-05-14 | Discharge: 2016-05-14 | Disposition: A | Discharge status: Home / Self Care | Payer: Medicare Other | Source: Ambulatory Visit | Attending: Vascular Surgery | Admitting: Vascular Surgery

## 2016-05-14 ENCOUNTER — Encounter (HOSPITAL_COMMUNITY)
Admission: RE | Disposition: A | Discharge status: Home / Self Care | Payer: Self-pay | Source: Ambulatory Visit | Attending: Vascular Surgery

## 2016-05-14 DIAGNOSIS — Z950 Presence of cardiac pacemaker: Secondary | ICD-10-CM

## 2016-05-14 DIAGNOSIS — Z7901 Long term (current) use of anticoagulants: Secondary | ICD-10-CM | POA: Insufficient documentation

## 2016-05-14 DIAGNOSIS — Z79899 Other long term (current) drug therapy: Secondary | ICD-10-CM

## 2016-05-14 DIAGNOSIS — R0600 Dyspnea, unspecified: Secondary | ICD-10-CM | POA: Diagnosis not present

## 2016-05-14 DIAGNOSIS — I120 Hypertensive chronic kidney disease with stage 5 chronic kidney disease or end stage renal disease: Secondary | ICD-10-CM | POA: Diagnosis not present

## 2016-05-14 DIAGNOSIS — I482 Chronic atrial fibrillation: Secondary | ICD-10-CM

## 2016-05-14 DIAGNOSIS — I130 Hypertensive heart and chronic kidney disease with heart failure and stage 1 through stage 4 chronic kidney disease, or unspecified chronic kidney disease: Secondary | ICD-10-CM

## 2016-05-14 DIAGNOSIS — N186 End stage renal disease: Secondary | ICD-10-CM

## 2016-05-14 DIAGNOSIS — N179 Acute kidney failure, unspecified: Secondary | ICD-10-CM | POA: Diagnosis not present

## 2016-05-14 DIAGNOSIS — I509 Heart failure, unspecified: Secondary | ICD-10-CM

## 2016-05-14 DIAGNOSIS — N189 Chronic kidney disease, unspecified: Secondary | ICD-10-CM | POA: Diagnosis not present

## 2016-05-14 DIAGNOSIS — I132 Hypertensive heart and chronic kidney disease with heart failure and with stage 5 chronic kidney disease, or end stage renal disease: Secondary | ICD-10-CM | POA: Diagnosis not present

## 2016-05-14 DIAGNOSIS — N185 Chronic kidney disease, stage 5: Secondary | ICD-10-CM | POA: Diagnosis not present

## 2016-05-14 DIAGNOSIS — Z87891 Personal history of nicotine dependence: Secondary | ICD-10-CM | POA: Diagnosis not present

## 2016-05-14 DIAGNOSIS — I5023 Acute on chronic systolic (congestive) heart failure: Secondary | ICD-10-CM | POA: Diagnosis not present

## 2016-05-14 DIAGNOSIS — Z951 Presence of aortocoronary bypass graft: Secondary | ICD-10-CM

## 2016-05-14 DIAGNOSIS — E1122 Type 2 diabetes mellitus with diabetic chronic kidney disease: Secondary | ICD-10-CM | POA: Insufficient documentation

## 2016-05-14 DIAGNOSIS — J9601 Acute respiratory failure with hypoxia: Secondary | ICD-10-CM | POA: Diagnosis not present

## 2016-05-14 DIAGNOSIS — I2510 Atherosclerotic heart disease of native coronary artery without angina pectoris: Secondary | ICD-10-CM

## 2016-05-14 DIAGNOSIS — E875 Hyperkalemia: Secondary | ICD-10-CM | POA: Diagnosis not present

## 2016-05-14 DIAGNOSIS — E119 Type 2 diabetes mellitus without complications: Secondary | ICD-10-CM | POA: Diagnosis not present

## 2016-05-14 DIAGNOSIS — I4891 Unspecified atrial fibrillation: Secondary | ICD-10-CM | POA: Diagnosis not present

## 2016-05-14 DIAGNOSIS — I5033 Acute on chronic diastolic (congestive) heart failure: Secondary | ICD-10-CM | POA: Diagnosis not present

## 2016-05-14 DIAGNOSIS — Z7984 Long term (current) use of oral hypoglycemic drugs: Secondary | ICD-10-CM | POA: Insufficient documentation

## 2016-05-14 HISTORY — PX: AV FISTULA PLACEMENT: SHX1204

## 2016-05-14 HISTORY — DX: Anemia, unspecified: D64.9

## 2016-05-14 LAB — GLUCOSE, CAPILLARY
Glucose-Capillary: 125 mg/dL — ABNORMAL HIGH (ref 65–99)
Glucose-Capillary: 105 mg/dL — ABNORMAL HIGH (ref 65–99)

## 2016-05-14 LAB — POCT I-STAT 4, (NA,K, GLUC, HGB,HCT)
Glucose, Bld: 124 mg/dL — ABNORMAL HIGH (ref 65–99)
HCT: 32 % — ABNORMAL LOW (ref 39.0–52.0)
Hemoglobin: 10.9 g/dL — ABNORMAL LOW (ref 13.0–17.0)
Potassium: 4.8 mmol/L (ref 3.5–5.1)
Sodium: 141 mmol/L (ref 135–145)

## 2016-05-14 LAB — APTT: aPTT: 33 seconds (ref 24–36)

## 2016-05-14 LAB — PROTIME-INR
INR: 1.31
PROTHROMBIN TIME: 16.4 s — AB (ref 11.4–15.2)

## 2016-05-14 SURGERY — ARTERIOVENOUS (AV) FISTULA CREATION
Anesthesia: Monitor Anesthesia Care | Site: Arm Lower | Laterality: Right

## 2016-05-14 MED ORDER — CHLORHEXIDINE GLUCONATE CLOTH 2 % EX PADS: 6.0000 | MEDICATED_PAD | Freq: Once | CUTANEOUS | Status: DC

## 2016-05-14 MED ORDER — FENTANYL CITRATE (PF) 100 MCG/2ML IJ SOLN
INTRAMUSCULAR | Status: AC
Start: 2016-05-14 — End: 2016-05-14
  Filled 2016-05-14: qty 2

## 2016-05-14 MED ORDER — PHENYLEPHRINE HCL 10 MG/ML IJ SOLN
INTRAVENOUS | Status: DC | PRN
  Administered 2016-05-14: 20 ug/min via INTRAVENOUS

## 2016-05-14 MED ORDER — ONDANSETRON HCL 4 MG/2ML IJ SOLN: 4.0000 mg | Freq: Four times a day (QID) | INTRAMUSCULAR | Status: DC | PRN

## 2016-05-14 MED ORDER — MIDAZOLAM HCL 2 MG/2ML IJ SOLN
INTRAMUSCULAR | Status: DC | PRN
  Administered 2016-05-14: 1 mg via INTRAVENOUS

## 2016-05-14 MED ORDER — OXYCODONE HCL 5 MG PO TABS: 5.0000 mg | ORAL_TABLET | Freq: Once | ORAL | Status: DC | PRN

## 2016-05-14 MED ORDER — EPHEDRINE SULFATE-NACL 50-0.9 MG/10ML-% IV SOSY
PREFILLED_SYRINGE | INTRAVENOUS | Status: DC | PRN
  Administered 2016-05-14: 10 mg via INTRAVENOUS

## 2016-05-14 MED ORDER — SODIUM CHLORIDE 0.9 % IV SOLN: INTRAVENOUS | Status: DC

## 2016-05-14 MED ORDER — MIDAZOLAM HCL 2 MG/2ML IJ SOLN
INTRAMUSCULAR | Status: AC
  Filled 2016-05-14: qty 2

## 2016-05-14 MED ORDER — 0.9 % SODIUM CHLORIDE (POUR BTL) OPTIME
TOPICAL | Status: DC | PRN
  Administered 2016-05-14: 1000 mL

## 2016-05-14 MED ORDER — HYDROCODONE-ACETAMINOPHEN 5-325 MG PO TABS: 1.0000 | ORAL_TABLET | Freq: Four times a day (QID) | ORAL | 0 refills | Status: DC | PRN

## 2016-05-14 MED ORDER — PHENYLEPHRINE 40 MCG/ML (10ML) SYRINGE FOR IV PUSH (FOR BLOOD PRESSURE SUPPORT)
PREFILLED_SYRINGE | INTRAVENOUS | Status: DC | PRN
  Administered 2016-05-14: 40 ug via INTRAVENOUS
  Administered 2016-05-14: 80 ug via INTRAVENOUS
  Administered 2016-05-14: 120 ug via INTRAVENOUS
  Administered 2016-05-14: 160 ug via INTRAVENOUS

## 2016-05-14 MED ORDER — PROTAMINE SULFATE 10 MG/ML IV SOLN
INTRAVENOUS | Status: AC
  Filled 2016-05-14: qty 5

## 2016-05-14 MED ORDER — ONDANSETRON HCL 4 MG/2ML IJ SOLN
INTRAMUSCULAR | Status: DC | PRN
  Administered 2016-05-14: 4 mg via INTRAVENOUS

## 2016-05-14 MED ORDER — ONDANSETRON HCL 4 MG/2ML IJ SOLN
INTRAMUSCULAR | Status: AC
  Filled 2016-05-14: qty 2

## 2016-05-14 MED ORDER — SODIUM CHLORIDE 0.9 % IV SOLN
INTRAVENOUS | Status: DC | PRN
  Administered 2016-05-14: 07:00:00 via INTRAVENOUS

## 2016-05-14 MED ORDER — LIDOCAINE HCL (PF) 1 % IJ SOLN
INTRAMUSCULAR | Status: AC
  Filled 2016-05-14: qty 30

## 2016-05-14 MED ORDER — LIDOCAINE HCL (PF) 1 % IJ SOLN
INTRAMUSCULAR | Status: DC | PRN
  Administered 2016-05-14: 30 mL

## 2016-05-14 MED ORDER — WARFARIN SODIUM 3 MG PO TABS: 3.0000 mg | ORAL_TABLET | Freq: Every day | ORAL | Status: AC

## 2016-05-14 MED ORDER — PROPOFOL 10 MG/ML IV BOLUS
INTRAVENOUS | Status: DC | PRN
  Administered 2016-05-14: 30 mg via INTRAVENOUS
  Administered 2016-05-14 (×2): 10 mg via INTRAVENOUS

## 2016-05-14 MED ORDER — FENTANYL CITRATE (PF) 100 MCG/2ML IJ SOLN: 25.0000 ug | INTRAMUSCULAR | Status: DC | PRN

## 2016-05-14 MED ORDER — OXYCODONE HCL 5 MG/5ML PO SOLN: 5.0000 mg | Freq: Once | ORAL | Status: DC | PRN

## 2016-05-14 MED ORDER — PROPOFOL 10 MG/ML IV BOLUS
INTRAVENOUS | Status: AC
  Filled 2016-05-14: qty 40

## 2016-05-14 MED ORDER — HEPARIN SODIUM (PORCINE) 1000 UNIT/ML IJ SOLN
INTRAMUSCULAR | Status: DC | PRN
  Administered 2016-05-14: 3000 [IU] via INTRAVENOUS

## 2016-05-14 MED ORDER — HEPARIN SODIUM (PORCINE) 5000 UNIT/ML IJ SOLN
INTRAMUSCULAR | Status: DC | PRN
  Administered 2016-05-14: 08:00:00

## 2016-05-14 MED ORDER — PROPOFOL 500 MG/50ML IV EMUL
INTRAVENOUS | Status: DC | PRN
  Administered 2016-05-14: 75 ug/kg/min via INTRAVENOUS

## 2016-05-14 MED ORDER — HEPARIN SODIUM (PORCINE) 1000 UNIT/ML IJ SOLN
INTRAMUSCULAR | Status: AC
  Filled 2016-05-14: qty 1

## 2016-05-14 SURGICAL SUPPLY — 36 items
ARMBAND PINK RESTRICT EXTREMIT (MISCELLANEOUS) ×2 IMPLANT
CANISTER SUCT 3000ML PPV (MISCELLANEOUS) ×2 IMPLANT
CANNULA VESSEL 3MM 2 BLNT TIP (CANNULA) ×2 IMPLANT
CLIP TI MEDIUM 6 (CLIP) ×2 IMPLANT
CLIP TI WIDE RED SMALL 6 (CLIP) ×2 IMPLANT
COVER PROBE W GEL 5X96 (DRAPES) IMPLANT
DECANTER SPIKE VIAL GLASS SM (MISCELLANEOUS) ×2 IMPLANT
DERMABOND ADVANCED (GAUZE/BANDAGES/DRESSINGS) ×1
DERMABOND ADVANCED .7 DNX12 (GAUZE/BANDAGES/DRESSINGS) ×1 IMPLANT
DRAIN PENROSE 1/4X12 LTX STRL (WOUND CARE) ×2 IMPLANT
ELECT REM PT RETURN 9FT ADLT (ELECTROSURGICAL) ×2
ELECTRODE REM PT RTRN 9FT ADLT (ELECTROSURGICAL) ×1 IMPLANT
GLOVE BIO SURGEON STRL SZ 6.5 (GLOVE) ×2 IMPLANT
GLOVE BIO SURGEON STRL SZ7.5 (GLOVE) ×2 IMPLANT
GLOVE BIOGEL PI IND STRL 6 (GLOVE) ×2 IMPLANT
GLOVE BIOGEL PI IND STRL 7.0 (GLOVE) ×4 IMPLANT
GLOVE BIOGEL PI INDICATOR 6 (GLOVE) ×2
GLOVE BIOGEL PI INDICATOR 7.0 (GLOVE) ×4
GLOVE SURG SS PI 6.0 STRL IVOR (GLOVE) ×2 IMPLANT
GLOVE SURG SS PI 6.5 STRL IVOR (GLOVE) ×4 IMPLANT
GOWN STRL REUS W/ TWL LRG LVL3 (GOWN DISPOSABLE) ×5 IMPLANT
GOWN STRL REUS W/TWL LRG LVL3 (GOWN DISPOSABLE) ×5
KIT BASIN OR (CUSTOM PROCEDURE TRAY) ×2 IMPLANT
KIT ROOM TURNOVER OR (KITS) ×2 IMPLANT
LOOP VESSEL MINI RED (MISCELLANEOUS) IMPLANT
NS IRRIG 1000ML POUR BTL (IV SOLUTION) ×2 IMPLANT
PACK CV ACCESS (CUSTOM PROCEDURE TRAY) ×2 IMPLANT
PAD ARMBOARD 7.5X6 YLW CONV (MISCELLANEOUS) ×4 IMPLANT
SPONGE SURGIFOAM ABS GEL 100 (HEMOSTASIS) IMPLANT
SUT PROLENE 6 0 BV (SUTURE) ×2 IMPLANT
SUT PROLENE 7 0 BV 1 (SUTURE) ×4 IMPLANT
SUT VIC AB 3-0 SH 27 (SUTURE) ×1
SUT VIC AB 3-0 SH 27X BRD (SUTURE) ×1 IMPLANT
SUT VICRYL 4-0 PS2 18IN ABS (SUTURE) ×2 IMPLANT
UNDERPAD 30X30 (UNDERPADS AND DIAPERS) ×2 IMPLANT
WATER STERILE IRR 1000ML POUR (IV SOLUTION) ×2 IMPLANT

## 2016-05-14 NOTE — Telephone Encounter (Signed)
-----   Message from Mena Goes, RN sent at 05/14/2016  9:41 AM EST ----- Regarding: 4-6 weeks w/ duplex    ----- Message ----- From: Alvia Grove, PA-C Sent: 05/14/2016   8:42 AM To: Vvs Charge Pool  S/p right radial-cephalic AV fistula [id number]  F/u with PA on CEF day in 4-6 weeks with duplex  Thanks Maudie Mercury

## 2016-05-14 NOTE — H&P (View-Only) (Signed)
Referring Physician: Mercy Moore  Patient name: Christopher [name] MRN: [id number] DOB: October 18, 1926 Sex: male  REASON FOR CONSULT: Hemodialysis access  HPI: Christopher [name] is a 83 y.o. male who currently is not on hemodialysis but has previously had a left radiocephalic fistula which occluded. He subsequently had a left brachiocephalic fistula and developed venous hypertension. This was ligated. He now presents for placement of a new hemodialysis access. He is right-handed. He has a pacemaker on the left side. He has chronic atrial fibrillation and is on Coumadin for this. He has not been bridged in the past. Other medical problems include coronary artery disease diabetes hypertension all of which are currently stable.  Past Medical History:  Diagnosis Date  . Atrial fibrillation ([place]) 08/07/2011   CHADS score  2 CHADS2 VASC score 4  Onset winter of 2013 Cardioversion May 2013 Reversion to a fib 3/14 amiodarone begun 07/02/12  Repeat cardioversion Jul 16, 2012    . BPH (benign prostatic hyperplasia)   . CAD (coronary artery disease)    followed by Dr. Wynonia Lawman  . CAD (coronary artery disease) 03/06/2010   CABG with LIMA to LAD, SVG to OM-OM2  05/05/01 Dr. Roxan Hockey  Cardiac Cath 12/2009 60% left main, occluded LAD, Widely patent SVG to OM1 and 2, patent LIMA, widely patent RCA    . Cardiac pacemaker in situ    07/26/10  Inserted for chronotropic incompetence and dyspnea  RA lead  Medtronic 5076  serial number [id number]   RV lead  Medtronic 5076  serial number [id number] Medtronic Adapta RL pulse generator, SN  [id number]    . CHF (congestive heart failure) ([place])   . Chronic kidney disease    kidneys function at "20 %" followed by Dr. Mercy Moore  . Diabetes mellitus without complication ([place])    Type II  . Dysrhythmia   . History of skin cancer   . HTN (hypertension)   . Hypertensive heart disease without CHF   . Lumbar disc disease   . Lumbar disc disease 1992  . Pacemaker    Medtronic-dual-chamber-DOI 2012  . Prostate cancer ([place])   . Shortness of breath dyspnea    with exertion  . Sinus node dysfunction (H[facility])    symptomatic bradycardia   Past Surgical History:  Procedure Laterality Date  . APPENDECTOMY  1980  . AV FISTULA PLACEMENT Left 11/08/2015   Procedure: ARTERIOVENOUS (AV) FISTULA CREATION;  Surgeon: [name], MD;  Location: [place];  Service: Vascular;  Laterality: Left;  . AV FISTULA PLACEMENT Left 01/03/2016   Procedure: Creation of BRACHIOCEPHALIC ARTERIOVENOUS (AV) FISTULA Left arm;  Surgeon: [name], MD;  Location: [place];  Service: Vascular;  Laterality: Left;  . CARDIAC CATHETERIZATION  2003  . CARDIOVERSION  08/08/2011   Procedure: CARDIOVERSION;  Surgeon: Jacolyn Reedy, MD;  Location: [place];  Service: Cardiovascular;  Laterality: N/A;  . CARDIOVERSION N/A 07/16/2012   Procedure: CARDIOVERSION;  Surgeon: Jacolyn Reedy, MD;  Location: [facility] ENDOSCOPY;  Service: Cardiovascular;  Laterality: N/A;  . CARDIOVERSION N/A 02/27/2015   Procedure: CARDIOVERSION;  Surgeon: Jacolyn Reedy, MD;  Location: [facility] ENDOSCOPY;  Service: Cardiovascular;  Laterality: N/A;  . CARDIOVERSION N/A 09/21/2015   Procedure: CARDIOVERSION;  Surgeon: Jacolyn Reedy, MD;  Location: [facility] ENDOSCOPY;  Service: Cardiovascular;  Laterality: N/A;  . CARDIOVERSION N/A 02/22/2016   Procedure: CARDIOVERSION;  Surgeon: Dorothy Spark, MD;  Location: [facility] ENDOSCOPY;  Service: Cardiovascular;  Laterality: N/A;  . cataracts removed    .  COLONOSCOPY    . CORONARY ARTERY BYPASS GRAFT  2003   x3  . CYSTOSCOPY WITH BIOPSY N/A 06/21/2013   Procedure: CYSTOSCOPY WITH BIOPSY;  Surgeon: Molli Hazard, MD;  [facility];  Service: Urology;  Laterality: N/A;    TUR RESECTION OF POLYP BILATERAL RETROGRADE PYELOGRAM BLADDER BIOPSY    . [name] SURGERY Bilateral    Cataracts  . HIP PINNING,CANNULATED Right 06/30/2014   Procedure: CANNULATED HIP PINNING;  Surgeon: Renette Butters, MD;  Location: [place];  Service: Orthopedics;  Laterality: Right;  . LIGATION OF ARTERIOVENOUS  FISTULA Left 01/17/2016   Procedure: LIGATION OF ARTERIOVENOUS  FISTULA UPPER ARM FISTULA;  Surgeon: [name], MD;  Location: [place];  Service: Vascular;  Laterality: Left;  . LUMBAR LAMINECTOMY  11/01  . PACEMAKER INSERTION  2012  . PROSTATE BIOPSY N/A 06/21/2013   Procedure: BIOPSY TRANSRECTAL ULTRASONIC PROSTATE (TUBP);  Surgeon: Molli Hazard, MD;  [facility];  Service: Urology;  Laterality: N/A;  PROSTATE NERVE BLOCK    Family History  Problem Relation Age of Onset  . Cancer Mother     breast  . Cancer Father     prostate, esophagus    SOCIAL HISTORY: Social History   Social History  . Marital status: Married    Spouse name: N/A  . Number of children: 3  . Years of education: N/A   Occupational History  . retired    Social History Main Topics  . Smoking status: Former Smoker    Packs/day: 0.75    Years: 24.00    Types: Cigarettes    Quit date: 03/12/1968  . Smokeless tobacco: Former Systems developer    Types: Snuff, Chew    Quit date: 03/11/1968  . Alcohol use No  . Drug use: No  . Sexual activity: Not Currently   Other Topics Concern  . Not on file   Social History Narrative  . No narrative on file    Allergies  Allergen Reactions  . Crestor [Rosuvastatin] Other (See Comments)    "ACHING JOINTS"  . Lipitor [Atorvastatin] Other (See Comments)    "ACHING JOINTS"  . Statins Other (See Comments)    "ACHING JOINTS"  . Penicillins Rash and Other (See Comments)    Has patient had a PCN reaction causing immediate rash, facial/tongue/throat swelling, SOB or lightheadedness with hypotension: yes Has patient had a PCN reaction causing severe rash involving mucus membranes or skin necrosis: no Has patient had a PCN reaction that required hospitalization no Has patient had a PCN reaction occurring within the last 10 years: no If all of the above answers are  "NO", then may proceed with Cephalosporin use.   . Tramadol Nausea Only    Current Outpatient Prescriptions  Medication Sig Dispense Refill  . amiodarone (PACERONE) 200 MG tablet Take 1 tablet (200 mg total) by mouth every morning.    . Darbepoetin Alfa (ARANESP) 100 MCG/0.5ML SOSY injection Inject 100 mcg into the skin every 21 ( twenty-one) days.    . folic acid (FOLVITE) A999333 MCG tablet Take 400 mcg by mouth daily.      . furosemide (LASIX) 80 MG tablet Take 1 tablet (80 mg total) by mouth 2 (two) times daily.    Marland Kitchen glimepiride (AMARYL) 1 MG tablet Take 1 tablet (1 mg total) by mouth daily before breakfast. 30 tablet 1  . Glucosamine HCl 1500 MG TABS Take 1,500 mg by mouth 2 (two) times daily.     Marland Kitchen  Horse Chestnut 300 MG CAPS Take 300 mg by mouth daily.     Marland Kitchen HYDROcodone-acetaminophen (NORCO/VICODIN) 5-325 MG tablet Take 1 tablet by mouth every 6 (six) hours as needed. For pain. 6 tablet 0  . metoprolol succinate (TOPROL-XL) 25 MG 24 hr tablet Take 25 mg by mouth daily.    . Multiple Vitamin (MULTIVITAMIN WITH MINERALS) TABS tablet Take 1 tablet by mouth daily.    . Omega-3 Fatty Acids (FISH OIL) 1200 MG CAPS Take 1,200 mg by mouth 2 (two) times daily.     Marland Kitchen warfarin (COUMADIN) 4 MG tablet Take 1 tablet (4 mg total) by mouth daily at 6 PM.    . carboxymethylcellulose (REFRESH PLUS) 0.5 % SOLN Place 1 drop into both eyes 3 (three) times daily as needed (for dry eyes.).    Marland Kitchen fluticasone (FLONASE) 50 MCG/ACT nasal spray Place 1-2 sprays into both nostrils daily as needed for allergies or rhinitis.    Marland Kitchen nitroGLYCERIN (NITROSTAT) 0.4 MG SL tablet Place 0.4 mg under the tongue every 5 (five) minutes as needed for chest pain.    . Polyethylene Glycol 3350 (MIRALAX PO) Take 17 g by mouth daily.      No current facility-administered medications for this visit.     ROS:   General:  No weight loss, Fever, chills  HEENT: No recent headaches, no nasal bleeding, no visual changes, no sore  throat  Neurologic: No dizziness, blackouts, seizures. No recent symptoms of stroke or mini- stroke. No recent episodes of slurred speech, or temporary blindness.  Cardiac: No recent episodes of chest pain/pressure, no shortness of breath at rest.  No shortness of breath with exertion.  Denies history of atrial fibrillation or irregular heartbeat  Vascular: No history of rest pain in feet.  No history of claudication.  No history of non-healing ulcer, No history of DVT   Pulmonary: No home oxygen, no productive cough, no hemoptysis,  No asthma or wheezing  Musculoskeletal:  [ ]  Arthritis, [ ]  Low back pain,  [ ]  Joint pain  Hematologic:No history of hypercoagulable state.  No history of easy bleeding.  No history of anemia  Gastrointestinal: No hematochezia or melena,  No gastroesophageal reflux, no trouble swallowing  Urinary: [X]  chronic Kidney disease, [ ]  on HD - [ ]  MWF or [ ]  TTHS, [ ]  Burning with urination, [ ]  Frequent urination, [ ]  Difficulty urinating;   Skin: No rashes  Psychological: No history of anxiety,  No history of depression   Physical Examination  Vitals:   04/25/16 0937  BP: 126/82  Pulse: 75  Resp: 18  Temp: 97 F (36.1 C)  TempSrc: Oral  SpO2: 96%  Weight: 236 lb 1.6 oz (107.1 kg)  Height: 6\' 2"  (1.88 m)    Body mass index is 30.31 kg/m.  General:  Alert and oriented, no acute distress HEENT: Normal Neck: No bruit or JVD Pulmonary: Clear to auscultation bilaterally Cardiac: Regular Rate and Rhythm without murmur Abdomen: Soft, non-tender, non-distended, no mass, no scars Skin: No rash Extremity Pulses:  2+ radial, brachial, femoral, dorsalis pedis, posterior tibial pulses bilaterally Musculoskeletal: No deformity or edema  Neurologic: Upper and lower extremity motor 5/5 and symmetric  DATA:  Patient had a vein mapping ultrasound which shows the right cephalic vein is 3 mm in diameter but dollops into the deeper system at the antecubital  level. Basilic vein on the right side is 3-5 mm. Radial artery was 3-1/2 mm in diameter.  ASSESSMENT:  Patient  needs long-term hemodialysis access.  PLAN:  Right radiocephalic AV fistula in the next few weeks. Risks benefits possible palpitations and procedure details were discussed the patient and his wife today including not limited to non-maturation of the fistula fistula thrombosis leading infection areas of numbness and tingling around the fistula. He understands and agrees to proceed.  We'll stop his Coumadin a few days perioperatively.   Ruta Hinds, MD [facility] [place] Office: [phone] Pager: [phone]

## 2016-05-14 NOTE — Anesthesia Preprocedure Evaluation (Signed)
Anesthesia Evaluation  Patient identified by MRN, date of birth, ID band Patient awake    Reviewed: Allergy & Precautions, H&P , NPO status , Patient's Chart, lab work & pertinent test results  Airway Mallampati: II   Neck ROM: full    Dental   Pulmonary former smoker,    breath sounds clear to auscultation       Cardiovascular hypertension, + CAD, + CABG and +CHF  + dysrhythmias Atrial Fibrillation + pacemaker  Rhythm:regular Rate:Normal     Neuro/Psych    GI/Hepatic   Endo/Other  diabetes, Type 2  Renal/GU ESRFRenal disease     Musculoskeletal   Abdominal   Peds  Hematology  (+) anemia ,   Anesthesia Other Findings   Reproductive/Obstetrics                             Anesthesia Physical Anesthesia Plan  ASA: III  Anesthesia Plan: MAC   Post-op Pain Management:    Induction: Intravenous  Airway Management Planned: Simple Face Mask  Additional Equipment:   Intra-op Plan:   Post-operative Plan:   Informed Consent: I have reviewed the patients History and Physical, chart, labs and discussed the procedure including the risks, benefits and alternatives for the proposed anesthesia with the patient or authorized representative who has indicated his/her understanding and acceptance.     Plan Discussed with: CRNA, Anesthesiologist and Surgeon  Anesthesia Plan Comments:         Anesthesia Quick Evaluation

## 2016-05-14 NOTE — Interval H&P Note (Signed)
History and Physical Interval Note:  05/14/2016 7:22 AM  Christopher [name]  has presented today for surgery, with the diagnosis of End Stage Renal Disease N18.6  The various methods of treatment have been discussed with the patient and family. After consideration of risks, benefits and other options for treatment, the patient has consented to  Procedure(s): RADIOCEPHALIC ARTERIOVENOUS (AV) FISTULA CREATION (Right) as a surgical intervention .  The patient's history has been reviewed, patient examined, no change in status, stable for surgery.  I have reviewed the patient's chart and labs.  Questions were answered to the patient's satisfaction.     Ruta Hinds

## 2016-05-14 NOTE — Transfer of Care (Signed)
Immediate Anesthesia Transfer of Care Note  Patient: Christopher [name]  Procedure(s) Performed: Procedure(s): RADIOCEPHALIC ARTERIOVENOUS (AV) FISTULA CREATION RIGHT LOWER ARM (Right)  Patient Location: PACU  Anesthesia Type:MAC  Level of Consciousness: awake, alert , oriented and patient cooperative  Airway & Oxygen Therapy: Patient Spontanous Breathing and Patient connected to face mask oxygen  Post-op Assessment: Report given to RN, Post -op Vital signs reviewed and stable and Patient moving all extremities X 4  Post vital signs: Reviewed and stable  Last Vitals:  Vitals:   05/14/16 0603 05/14/16 0855  BP: (!) 143/79   Pulse: 71   Resp: 20   Temp: 36.7 C 36.2 C    Last Pain:  Vitals:   05/14/16 0603  TempSrc: Oral      Patients Stated Pain Goal: 5 (34/94/94 4739)  Complications: No apparent anesthesia complications

## 2016-05-14 NOTE — Anesthesia Procedure Notes (Signed)
Procedure Name: MAC Date/Time: 05/14/2016 7:30 AM Performed by: Mervyn Gay Pre-anesthesia Checklist: Patient identified, Patient being monitored, Timeout performed, Emergency Drugs available and Suction available Patient Re-evaluated:Patient Re-evaluated prior to inductionOxygen Delivery Method: Simple face mask Preoxygenation: Pre-oxygenation with 100% oxygen Number of attempts: 1 Placement Confirmation: positive ETCO2 Dental Injury: Teeth and Oropharynx as per pre-operative assessment

## 2016-05-14 NOTE — Anesthesia Postprocedure Evaluation (Addendum)
Anesthesia Post Note  Patient: Christopher [name]  Procedure(s) Performed: Procedure(s) (LRB): RADIOCEPHALIC ARTERIOVENOUS (AV) FISTULA CREATION RIGHT LOWER ARM (Right)  Patient location during evaluation: PACU Anesthesia Type: MAC Level of consciousness: awake and alert Pain management: pain level controlled Vital Signs Assessment: post-procedure vital signs reviewed and stable Respiratory status: spontaneous breathing, nonlabored ventilation, respiratory function stable and patient connected to nasal cannula oxygen Cardiovascular status: stable and blood pressure returned to baseline Anesthetic complications: no       Last Vitals:  Vitals:   05/14/16 0930 05/14/16 0945  BP: 125/79 122/73  Pulse: 70 77  Resp: 17 13  Temp:  36.1 C    Last Pain:  Vitals:   05/14/16 0945  TempSrc:   PainSc: 0-No pain                 Elfreda Blanchet S

## 2016-05-14 NOTE — Telephone Encounter (Signed)
Spoke to pt, PA to see on 4/18 for [place] and PO  Mailed lttr

## 2016-05-14 NOTE — Op Note (Signed)
Procedure: Right Radial Cephalic AV fistula   Preop: ESRD   Postop: ESRD   Anesthesia: MAC with local   Assistant: Silva Bandy, PA-c   Findings: 3 mm cephalic vein       2.5 mm radial artery calcified  Procedure Details:  The left upper extremity was prepped and draped in usual sterile fashion. Local anesthesia was infiltrated midway between the cephalic and radial artery anatomically.  A longitudinal skin incision was then made in this location at the distal left forearm. The incision was carried into the subcutaneous tissues down to level cephalic vein. The vein had some spasm but was overall reasonable quality. The vein was dissected free circumferentially and small side branches ligated and divided between silk ties. The distal end was ligated and the vein probed and found to accept up to a 3 mm dilator. This was gently distended with heparinized saline, spatulated, and marked for orientation. Next the radial artery was dissected free in the medial portion incision. The artery was 2.5 mm in diameter but had a reasonable pulse. It was calcified.  The vessel loops were placed proximal and distal to the planned site of arteriotomy. The patient was given 3000 units of intravenous heparin. After appropriate circulation time, the vessel loops were used to control the artery. A longitudinal opening was made in the left radial artery. The vein was controlled proximally with a fine bulldog clamp. The vein was then swung over to the artery and sewn end of vein to side of artery using a running 7-0 Prolene suture. Just prior to completion, the anastomosis was fore bled back bled and thoroughly flushed. The anastomosis was secured, vessel loops released, and there was a palpable thrill in the fistula immediately. After hemostasis was obtained, the subcutaneous tissues were reapproximated using a running 3-0 Vicryl suture. The skin was then closed with a 4 0 Vicryl subcuticular stitch. Dermabond was applied to  the skin incision. The patient tolerated the procedure well and there were no complications. Instrument sponge and needle count were correct at the end of the case. The patient was taken to PACU in stable condition.   Ruta Hinds, MD  [facility]  Office: [phone]  Pager: [phone]

## 2016-05-15 ENCOUNTER — Encounter (HOSPITAL_COMMUNITY): Payer: Self-pay | Admitting: Vascular Surgery

## 2016-05-16 ENCOUNTER — Inpatient Hospital Stay (HOSPITAL_COMMUNITY)
Admission: EM | Admit: 2016-05-16 | Discharge: 2016-05-20 | DRG: 264 | Disposition: A | Discharge status: Home Health | Payer: Medicare Other | Attending: Internal Medicine | Admitting: Internal Medicine

## 2016-05-16 DIAGNOSIS — Z800 Family history of malignant neoplasm of digestive organs: Secondary | ICD-10-CM

## 2016-05-16 DIAGNOSIS — I4510 Unspecified right bundle-branch block: Secondary | ICD-10-CM | POA: Diagnosis present

## 2016-05-16 DIAGNOSIS — Z6831 Body mass index (BMI) 31.0-31.9, adult: Secondary | ICD-10-CM

## 2016-05-16 DIAGNOSIS — I5033 Acute on chronic diastolic (congestive) heart failure: Secondary | ICD-10-CM | POA: Diagnosis present

## 2016-05-16 DIAGNOSIS — Z880 Allergy status to penicillin: Secondary | ICD-10-CM

## 2016-05-16 DIAGNOSIS — Z87891 Personal history of nicotine dependence: Secondary | ICD-10-CM | POA: Diagnosis not present

## 2016-05-16 DIAGNOSIS — J810 Acute pulmonary edema: Secondary | ICD-10-CM | POA: Diagnosis present

## 2016-05-16 DIAGNOSIS — Z885 Allergy status to narcotic agent status: Secondary | ICD-10-CM

## 2016-05-16 DIAGNOSIS — R791 Abnormal coagulation profile: Secondary | ICD-10-CM | POA: Diagnosis present

## 2016-05-16 DIAGNOSIS — N185 Chronic kidney disease, stage 5: Secondary | ICD-10-CM | POA: Diagnosis present

## 2016-05-16 DIAGNOSIS — E1121 Type 2 diabetes mellitus with diabetic nephropathy: Secondary | ICD-10-CM

## 2016-05-16 DIAGNOSIS — N189 Chronic kidney disease, unspecified: Secondary | ICD-10-CM | POA: Diagnosis not present

## 2016-05-16 DIAGNOSIS — N186 End stage renal disease: Secondary | ICD-10-CM | POA: Diagnosis present

## 2016-05-16 DIAGNOSIS — I132 Hypertensive heart and chronic kidney disease with heart failure and with stage 5 chronic kidney disease, or end stage renal disease: Principal | ICD-10-CM | POA: Diagnosis present

## 2016-05-16 DIAGNOSIS — Z8546 Personal history of malignant neoplasm of prostate: Secondary | ICD-10-CM

## 2016-05-16 DIAGNOSIS — R404 Transient alteration of awareness: Secondary | ICD-10-CM | POA: Diagnosis not present

## 2016-05-16 DIAGNOSIS — E875 Hyperkalemia: Secondary | ICD-10-CM | POA: Diagnosis present

## 2016-05-16 DIAGNOSIS — Z8042 Family history of malignant neoplasm of prostate: Secondary | ICD-10-CM

## 2016-05-16 DIAGNOSIS — I480 Paroxysmal atrial fibrillation: Secondary | ICD-10-CM | POA: Diagnosis present

## 2016-05-16 DIAGNOSIS — I5023 Acute on chronic systolic (congestive) heart failure: Secondary | ICD-10-CM | POA: Diagnosis not present

## 2016-05-16 DIAGNOSIS — Z888 Allergy status to other drugs, medicaments and biological substances status: Secondary | ICD-10-CM

## 2016-05-16 DIAGNOSIS — Z803 Family history of malignant neoplasm of breast: Secondary | ICD-10-CM

## 2016-05-16 DIAGNOSIS — N400 Enlarged prostate without lower urinary tract symptoms: Secondary | ICD-10-CM | POA: Diagnosis present

## 2016-05-16 DIAGNOSIS — D638 Anemia in other chronic diseases classified elsewhere: Secondary | ICD-10-CM | POA: Diagnosis present

## 2016-05-16 DIAGNOSIS — Z950 Presence of cardiac pacemaker: Secondary | ICD-10-CM

## 2016-05-16 DIAGNOSIS — Z7984 Long term (current) use of oral hypoglycemic drugs: Secondary | ICD-10-CM

## 2016-05-16 DIAGNOSIS — J9601 Acute respiratory failure with hypoxia: Secondary | ICD-10-CM | POA: Diagnosis present

## 2016-05-16 DIAGNOSIS — E669 Obesity, unspecified: Secondary | ICD-10-CM | POA: Diagnosis present

## 2016-05-16 DIAGNOSIS — Z79899 Other long term (current) drug therapy: Secondary | ICD-10-CM

## 2016-05-16 DIAGNOSIS — I130 Hypertensive heart and chronic kidney disease with heart failure and stage 1 through stage 4 chronic kidney disease, or unspecified chronic kidney disease: Secondary | ICD-10-CM | POA: Diagnosis not present

## 2016-05-16 DIAGNOSIS — Z7901 Long term (current) use of anticoagulants: Secondary | ICD-10-CM

## 2016-05-16 DIAGNOSIS — I482 Chronic atrial fibrillation: Secondary | ICD-10-CM | POA: Diagnosis present

## 2016-05-16 DIAGNOSIS — I509 Heart failure, unspecified: Secondary | ICD-10-CM

## 2016-05-16 DIAGNOSIS — Z951 Presence of aortocoronary bypass graft: Secondary | ICD-10-CM

## 2016-05-16 DIAGNOSIS — Z9842 Cataract extraction status, left eye: Secondary | ICD-10-CM

## 2016-05-16 DIAGNOSIS — E1122 Type 2 diabetes mellitus with diabetic chronic kidney disease: Secondary | ICD-10-CM | POA: Diagnosis present

## 2016-05-16 DIAGNOSIS — Z79891 Long term (current) use of opiate analgesic: Secondary | ICD-10-CM

## 2016-05-16 DIAGNOSIS — I2510 Atherosclerotic heart disease of native coronary artery without angina pectoris: Secondary | ICD-10-CM | POA: Diagnosis present

## 2016-05-16 DIAGNOSIS — R531 Weakness: Secondary | ICD-10-CM | POA: Diagnosis not present

## 2016-05-16 DIAGNOSIS — Z9841 Cataract extraction status, right eye: Secondary | ICD-10-CM

## 2016-05-16 DIAGNOSIS — Z85828 Personal history of other malignant neoplasm of skin: Secondary | ICD-10-CM

## 2016-05-16 DIAGNOSIS — N179 Acute kidney failure, unspecified: Secondary | ICD-10-CM | POA: Diagnosis present

## 2016-05-16 DIAGNOSIS — D631 Anemia in chronic kidney disease: Secondary | ICD-10-CM | POA: Diagnosis present

## 2016-05-16 NOTE — ED Provider Notes (Signed)
By signing my name below, I, Avnee Patel, attest that this documentation has been prepared under the direction and in the presence of [place], DO  Electronically Signed: Delton Prairie, ED Scribe. 05/16/16. 12:13 AM.  TIME SEEN: 12:02 AM  CHIEF COMPLAINT:  Chief Complaint  Patient presents with  . Shortness of Breath    HPI:  Christopher [name] is a 83 y.o. male, with a PMHx of CHF with pacemaker, CAD, atrial fibrillation, chronic kidney disease and PSHx of CABG, who presents to the Emergency Department complaining of acute onset SOB which began 3 days ago. He also reports generalized weakness and a dry cough. Pt states his symptoms began after he had a surgical fistula placement to his right wrist 3 days ago by Dr. Oneida Alar.  States he felt great when he was discharged but then later that night began feeling poorly. He reports relief when on oxygen with EMS. Pt denies fevers, chills, chest pain, vomiting, diarrhea, decreased urinary output or any other associated symptoms. No numbness, tingling or focal weakness. No other complaints noted.   PCP: Tivis Ringer, MD   ROS: See HPI Constitutional: no fever  Eyes: no drainage  ENT: no runny nose   Cardiovascular:  no chest pain  Resp: + SOB  GI: no vomiting GU: no dysuria Integumentary: no rash  Allergy: no hives  Musculoskeletal: no leg swelling  Neurological: no slurred speech ROS otherwise negative  PAST MEDICAL HISTORY/PAST SURGICAL HISTORY:  Past Medical History:  Diagnosis Date  . Anemia   . Atrial fibrillation ([place]) 08/07/2011   CHADS score  2 CHADS2 VASC score 4  Onset winter of 2013 Cardioversion May 2013 Reversion to a fib 3/14 amiodarone begun 07/02/12  Repeat cardioversion Jul 16, 2012    . BPH (benign prostatic hyperplasia)   . CAD (coronary artery disease)    followed by Dr. Wynonia Lawman  . CAD (coronary artery disease) 03/06/2010   CABG with LIMA to LAD, SVG to OM-OM2  05/05/01 Dr. Roxan Hockey  Cardiac Cath 12/2009 60%  left main, occluded LAD, Widely patent SVG to OM1 and 2, patent LIMA, widely patent RCA    . Cardiac pacemaker in situ    07/26/10  Inserted for chronotropic incompetence and dyspnea  RA lead  Medtronic 5076  serial number [id number]   RV lead  Medtronic 5076  serial number [id number] Medtronic Adapta RL pulse generator, SN  [id number]    . CHF (congestive heart failure) ([place])   . Chronic kidney disease    kidneys function at "20 %" followed by Dr. Mercy Moore  . Diabetes mellitus without complication ([place])    Type II  . Dysrhythmia   . History of skin cancer   . HTN (hypertension)   . Hypertensive heart disease without CHF   . Lumbar disc disease   . Lumbar disc disease 1992  . Pacemaker    Medtronic-dual-chamber-DOI 2012  . Prostate cancer ([place])   . Shortness of breath dyspnea    with exertion  . Sinus node dysfunction ([facility])    symptomatic bradycardia    MEDICATIONS:  Prior to Admission medications   Medication Sig Start Date End Date Taking? Authorizing Provider  amiodarone (PACERONE) 200 MG tablet Take 1 tablet (200 mg total) by mouth every morning. 02/28/15   Jacolyn Reedy, MD  carboxymethylcellulose (REFRESH PLUS) 0.5 % SOLN Place 1 drop into both eyes 3 (three) times daily as needed (for dry eyes.).    Historical Provider, MD  Darbepoetin Alfa Kyra Searles)  100 MCG/0.5ML SOSY injection Inject 100 mcg into the skin every 21 ( twenty-one) days.    Historical Provider, MD  folic acid (FOLVITE) 732 MCG tablet Take 400 mcg by mouth daily.      Historical Provider, MD  furosemide (LASIX) 80 MG tablet Take 1 tablet (80 mg total) by mouth 2 (two) times daily. 01/03/16   Samantha J Rhyne, PA-C  glimepiride (AMARYL) 1 MG tablet Take 1 tablet (1 mg total) by mouth daily before breakfast. 07/13/14   Ivan Anchors Love, PA-C  Glucosamine HCl 1500 MG TABS Take 1,500 mg by mouth 2 (two) times daily.     Historical Provider, MD  [place] 300 MG CAPS Take 300 mg by mouth daily.     Historical  Provider, MD  HYDROcodone-acetaminophen (NORCO/VICODIN) 5-325 MG tablet Take 1 tablet by mouth every 6 (six) hours as needed. For pain. 05/14/16   Alvia Grove, PA-C  metoprolol succinate (TOPROL-XL) 25 MG 24 hr tablet Take 25 mg by mouth daily. 11/30/15   Historical Provider, MD  Multiple Vitamin (MULTIVITAMIN WITH MINERALS) TABS tablet Take 1 tablet by mouth daily.    Historical Provider, MD  nitroGLYCERIN (NITROSTAT) 0.4 MG SL tablet Place 0.4 mg under the tongue every 5 (five) minutes as needed for chest pain. 10/26/15   Historical Provider, MD  Omega-3 Fatty Acids (FISH OIL) 1200 MG CAPS Take 1,200 mg by mouth 2 (two) times daily.     Historical Provider, MD  Polyethylene Glycol 3350 (MIRALAX PO) Take 17 g by mouth daily.     Historical Provider, MD  warfarin (COUMADIN) 3 MG tablet Take 1 tablet (3 mg total) by mouth daily. 05/15/16   Alvia Grove, PA-C    ALLERGIES:  Allergies  Allergen Reactions  . Crestor [Rosuvastatin] Other (See Comments)    "ACHING JOINTS"  . Lipitor [Atorvastatin] Other (See Comments)    "ACHING JOINTS"  . Statins Other (See Comments)    "ACHING JOINTS"  . Penicillins Rash and Other (See Comments)    Has patient had a PCN reaction causing immediate rash, facial/tongue/throat swelling, SOB or lightheadedness with hypotension: yes Has patient had a PCN reaction causing severe rash involving mucus membranes or skin necrosis: no Has patient had a PCN reaction that required hospitalization no Has patient had a PCN reaction occurring within the last 10 years: no If all of the above answers are "NO", then may proceed with Cephalosporin use.   . Tramadol Nausea Only    SOCIAL HISTORY:  Social History  Substance Use Topics  . Smoking status: Former Smoker    Packs/day: 0.75    Years: 24.00    Types: Cigarettes    Quit date: 03/12/1968  . Smokeless tobacco: Former Systems developer    Types: Snuff, Chew    Quit date: 03/11/1968  . Alcohol use No    FAMILY  HISTORY: Family History  Problem Relation Age of Onset  . Cancer Mother     breast  . Cancer Father     prostate, esophagus    EXAM: BP 143/57 (BP Location: Right Arm)   Pulse 75   Temp 97.6 F (36.4 C) (Oral)   Resp 20   SpO2 96%  CONSTITUTIONAL: Alert and oriented and responds appropriately to questions. Elderly and chronically ill appearing. Obese. HEAD: Normocephalic EYES: Conjunctivae clear, pupils appear equal, EOMI ENT: normal nose; moist mucous membranes NECK: Supple, no meningismus, no nuchal rigidity, no LAD  CARD: Irregularly irregular; S1 and S2 appreciated; no murmurs,  no clicks, no rubs, no gallops RESP: Normal chest excursion without splinting or tachypnea; Crackles at bilateral bases. no wheezes, no rhonchi, no rales, no hypoxia or respiratory distress, speaking full sentences ABD/GI: Normal bowel sounds; non-distended; soft, non-tender, no rebound, no guarding, no peritoneal signs, no hepatosplenomegaly BACK:  The back appears normal and is non-tender to palpation, there is no CVA tenderness EXT: Fistula in the right wrist has normal thrill. There is a 2+ right radial pulse. Surgical incision is dry without drainage. Incision is intact. There is some erythema on palpation but no significant warmth, ecchymosis or swelling. Compartments of the right arm are soft. He denies any tenderness in this arm. Normal ROM in all joints; otherwise x-rays are non-tender to palpation; no edema; normal capillary refill; no cyanosis, no calf tenderness or swelling    SKIN: Normal color for age and race; warm; no rash NEURO: Moves all extremities equally, sensation to light touch intact diffusely, normal speech, cranial nerves II through XII intact PSYCH: The patient's mood and manner are appropriate. Grooming and personal hygiene are appropriate.  MEDICAL DECISION MAKING: Patient here with shortness of breath. Does have history of CHF and has bibasilar crackles on exam. No peripheral  edema or JVD but could be volume overloaded. He has not yet started dialysis. States he is still making urine. Reports he was hypoxic at 80% on room air at rest at home per EMS. Here he is 96% on room air at rest. He denies chest pain or discomfort. No fevers or cough. We'll obtain labs, urine, chest x-ray. EKG shows atrial fibrillation with right bundle branch block with no new ischemic abnormality.  ED PROGRESS: Patient's BNP is elevated. Chest x-ray shows pulmonary edema. We'll give 80 mg of IV Lasix. He takes 80 mg by mouth twice a day at home. Discussed with Dr. Hal Hope with hospitalist service who agrees on admission. He will place admission orders. Family and patient have been updated with this plan and agree with admission.  I reviewed all nursing notes, vitals, pertinent previous records, EKGs, lab and urine results, imaging (as available).    EKG Interpretation  Date/Time:  Friday May 17 2016 00:10:02 EST Ventricular Rate:  82 PR Interval:    QRS Duration: 136 QT Interval:  410 QTC Calculation: 502 R Axis:   39 Text Interpretation:  Atrial fibrillation Right bundle branch block No significant change since last tracing Confirmed by Lujain Kraszewski,  DO, Vessie Olmsted ([id number]) on 05/17/2016 12:21:51 AM        I personally performed the services described in this documentation, which was scribed in my presence. The recorded information has been reviewed and is accurate.     [place], DO 05/17/16 [phone]

## 2016-05-16 NOTE — ED Triage Notes (Signed)
Pt had fistula but in R wrist on Tuesday. Upon arriving home generalized weakness and SOB that is worse on exertion. Pt has a pacemaker.

## 2016-05-17 ENCOUNTER — Emergency Department (HOSPITAL_COMMUNITY): Payer: Medicare Other

## 2016-05-17 ENCOUNTER — Encounter (HOSPITAL_COMMUNITY): Payer: Self-pay | Admitting: Emergency Medicine

## 2016-05-17 DIAGNOSIS — I482 Chronic atrial fibrillation: Secondary | ICD-10-CM | POA: Diagnosis present

## 2016-05-17 DIAGNOSIS — E1129 Type 2 diabetes mellitus with other diabetic kidney complication: Secondary | ICD-10-CM | POA: Diagnosis not present

## 2016-05-17 DIAGNOSIS — I4891 Unspecified atrial fibrillation: Secondary | ICD-10-CM | POA: Diagnosis not present

## 2016-05-17 DIAGNOSIS — I5032 Chronic diastolic (congestive) heart failure: Secondary | ICD-10-CM | POA: Diagnosis not present

## 2016-05-17 DIAGNOSIS — E1122 Type 2 diabetes mellitus with diabetic chronic kidney disease: Secondary | ICD-10-CM | POA: Diagnosis present

## 2016-05-17 DIAGNOSIS — Z950 Presence of cardiac pacemaker: Secondary | ICD-10-CM | POA: Diagnosis not present

## 2016-05-17 DIAGNOSIS — I4510 Unspecified right bundle-branch block: Secondary | ICD-10-CM | POA: Diagnosis present

## 2016-05-17 DIAGNOSIS — N179 Acute kidney failure, unspecified: Secondary | ICD-10-CM | POA: Diagnosis present

## 2016-05-17 DIAGNOSIS — I2510 Atherosclerotic heart disease of native coronary artery without angina pectoris: Secondary | ICD-10-CM | POA: Diagnosis present

## 2016-05-17 DIAGNOSIS — E875 Hyperkalemia: Secondary | ICD-10-CM | POA: Diagnosis present

## 2016-05-17 DIAGNOSIS — R0600 Dyspnea, unspecified: Secondary | ICD-10-CM | POA: Diagnosis not present

## 2016-05-17 DIAGNOSIS — J810 Acute pulmonary edema: Secondary | ICD-10-CM | POA: Diagnosis not present

## 2016-05-17 DIAGNOSIS — J9601 Acute respiratory failure with hypoxia: Secondary | ICD-10-CM | POA: Diagnosis present

## 2016-05-17 DIAGNOSIS — E669 Obesity, unspecified: Secondary | ICD-10-CM | POA: Diagnosis not present

## 2016-05-17 DIAGNOSIS — Z885 Allergy status to narcotic agent status: Secondary | ICD-10-CM | POA: Diagnosis not present

## 2016-05-17 DIAGNOSIS — Z8042 Family history of malignant neoplasm of prostate: Secondary | ICD-10-CM | POA: Diagnosis not present

## 2016-05-17 DIAGNOSIS — Z7901 Long term (current) use of anticoagulants: Secondary | ICD-10-CM | POA: Diagnosis not present

## 2016-05-17 DIAGNOSIS — N186 End stage renal disease: Secondary | ICD-10-CM | POA: Diagnosis present

## 2016-05-17 DIAGNOSIS — N184 Chronic kidney disease, stage 4 (severe): Secondary | ICD-10-CM

## 2016-05-17 DIAGNOSIS — D631 Anemia in chronic kidney disease: Secondary | ICD-10-CM | POA: Diagnosis not present

## 2016-05-17 DIAGNOSIS — Z800 Family history of malignant neoplasm of digestive organs: Secondary | ICD-10-CM | POA: Diagnosis not present

## 2016-05-17 DIAGNOSIS — Z87891 Personal history of nicotine dependence: Secondary | ICD-10-CM | POA: Diagnosis not present

## 2016-05-17 DIAGNOSIS — I5033 Acute on chronic diastolic (congestive) heart failure: Secondary | ICD-10-CM | POA: Diagnosis not present

## 2016-05-17 DIAGNOSIS — I132 Hypertensive heart and chronic kidney disease with heart failure and with stage 5 chronic kidney disease, or end stage renal disease: Secondary | ICD-10-CM | POA: Diagnosis present

## 2016-05-17 DIAGNOSIS — Z9841 Cataract extraction status, right eye: Secondary | ICD-10-CM | POA: Diagnosis not present

## 2016-05-17 DIAGNOSIS — R918 Other nonspecific abnormal finding of lung field: Secondary | ICD-10-CM | POA: Diagnosis not present

## 2016-05-17 DIAGNOSIS — I129 Hypertensive chronic kidney disease with stage 1 through stage 4 chronic kidney disease, or unspecified chronic kidney disease: Secondary | ICD-10-CM | POA: Diagnosis not present

## 2016-05-17 DIAGNOSIS — Z803 Family history of malignant neoplasm of breast: Secondary | ICD-10-CM | POA: Diagnosis not present

## 2016-05-17 DIAGNOSIS — Z888 Allergy status to other drugs, medicaments and biological substances status: Secondary | ICD-10-CM | POA: Diagnosis not present

## 2016-05-17 DIAGNOSIS — Z9842 Cataract extraction status, left eye: Secondary | ICD-10-CM | POA: Diagnosis not present

## 2016-05-17 DIAGNOSIS — Z880 Allergy status to penicillin: Secondary | ICD-10-CM | POA: Diagnosis not present

## 2016-05-17 DIAGNOSIS — D638 Anemia in other chronic diseases classified elsewhere: Secondary | ICD-10-CM

## 2016-05-17 DIAGNOSIS — Z951 Presence of aortocoronary bypass graft: Secondary | ICD-10-CM | POA: Diagnosis not present

## 2016-05-17 LAB — URINALYSIS, ROUTINE W REFLEX MICROSCOPIC
Bilirubin Urine: NEGATIVE
GLUCOSE, UA: NEGATIVE mg/dL
HGB URINE DIPSTICK: NEGATIVE
Ketones, ur: NEGATIVE mg/dL
Leukocytes, UA: NEGATIVE
Nitrite: NEGATIVE
PH: 5 (ref 5.0–8.0)
Protein, ur: NEGATIVE mg/dL
SPECIFIC GRAVITY, URINE: 1.011 (ref 1.005–1.030)

## 2016-05-17 LAB — TROPONIN I
TROPONIN I: 0.03 ng/mL — AB (ref <0.03)
TROPONIN I: 0.03 ng/mL — AB (ref <0.03)
Troponin I: 0.04 ng/mL (ref <0.03)
Troponin I: 0.03 ng/mL (ref <0.03)

## 2016-05-17 LAB — COMPREHENSIVE METABOLIC PANEL
ALT: 44 U/L (ref 17–63)
AST: 38 U/L (ref 15–41)
Albumin: 2.9 g/dL — ABNORMAL LOW (ref 3.5–5.0)
Alkaline Phosphatase: 45 U/L (ref 38–126)
Anion gap: 9 (ref 5–15)
BUN: 94 mg/dL — ABNORMAL HIGH (ref 6–20)
CHLORIDE: 106 mmol/L (ref 101–111)
CO2: 23 mmol/L (ref 22–32)
Calcium: 8.8 mg/dL — ABNORMAL LOW (ref 8.9–10.3)
Creatinine, Ser: 3.99 mg/dL — ABNORMAL HIGH (ref 0.61–1.24)
GFR, EST AFRICAN AMERICAN: 14 mL/min — AB (ref >60)
GFR, EST NON AFRICAN AMERICAN: 12 mL/min — AB (ref >60)
Glucose, Bld: 79 mg/dL (ref 65–99)
Potassium: 5 mmol/L (ref 3.5–5.1)
Sodium: 138 mmol/L (ref 135–145)
Total Bilirubin: 1 mg/dL (ref 0.3–1.2)
Total Protein: 6.1 g/dL — ABNORMAL LOW (ref 6.5–8.1)

## 2016-05-17 LAB — PROTIME-INR
INR: 1.27
Prothrombin Time: 16 seconds — ABNORMAL HIGH (ref 11.4–15.2)

## 2016-05-17 LAB — BASIC METABOLIC PANEL
Anion gap: 13 (ref 5–15)
BUN: 95 mg/dL — ABNORMAL HIGH (ref 6–20)
CALCIUM: 8.9 mg/dL (ref 8.9–10.3)
CO2: 21 mmol/L — AB (ref 22–32)
CREATININE: 4.14 mg/dL — AB (ref 0.61–1.24)
Chloride: 105 mmol/L (ref 101–111)
GFR calc Af Amer: 14 mL/min — ABNORMAL LOW (ref >60)
GFR calc non Af Amer: 12 mL/min — ABNORMAL LOW (ref >60)
Glucose, Bld: 141 mg/dL — ABNORMAL HIGH (ref 65–99)
Potassium: 5.4 mmol/L — ABNORMAL HIGH (ref 3.5–5.1)
Sodium: 139 mmol/L (ref 135–145)

## 2016-05-17 LAB — CBC WITH DIFFERENTIAL/PLATELET
BASOS ABS: 0 10*3/uL (ref 0.0–0.1)
Basophils Relative: 0 %
Basophils Relative: 0 %
EOS PCT: 0 %
Eosinophils Absolute: 0 10*3/uL (ref 0.0–0.7)
Eosinophils Relative: 0 %
HCT: 33.8 % — ABNORMAL LOW (ref 39.0–52.0)
HEMATOCRIT: 33.7 % — AB (ref 39.0–52.0)
HEMOGLOBIN: 9.8 g/dL — AB (ref 13.0–17.0)
Hemoglobin: 10.3 g/dL — ABNORMAL LOW (ref 13.0–17.0)
LYMPHS ABS: 0.8 10*3/uL (ref 0.7–4.0)
LYMPHS PCT: 7 %
LYMPHS PCT: 8 %
MCH: 30.8 pg (ref 26.0–34.0)
MCH: 29 pg (ref 26.0–34.0)
MCHC: 29 g/dL — ABNORMAL LOW (ref 30.0–36.0)
MCHC: 30.6 g/dL (ref 30.0–36.0)
MCV: 100 fL (ref 78.0–100.0)
MCV: 100.9 fL — AB (ref 78.0–100.0)
MONOS PCT: 7 %
Monocytes Absolute: 0.6 10*3/uL (ref 0.1–1.0)
Monocytes Relative: 6 %
NEUTROS PCT: 86 %
Neutro Abs: 8.4 10*3/uL — ABNORMAL HIGH (ref 1.7–7.7)
Neutrophils Relative %: 86 %
PLATELETS: 226 10*3/uL (ref 150–400)
Platelets: 218 10*3/uL (ref 150–400)
RBC: 3.34 MIL/uL — ABNORMAL LOW (ref 4.22–5.81)
RBC: 3.38 MIL/uL — AB (ref 4.22–5.81)
RDW: 20 % — ABNORMAL HIGH (ref 11.5–15.5)
RDW: 20.4 % — AB (ref 11.5–15.5)
WBC: 9.8 10*3/uL (ref 4.0–10.5)
WBC: 9.9 10*3/uL (ref 4.0–10.5)

## 2016-05-17 LAB — TSH: TSH: 2.29 u[IU]/mL (ref 0.350–4.500)

## 2016-05-17 LAB — HEPARIN LEVEL (UNFRACTIONATED): HEPARIN UNFRACTIONATED: 0.61 [IU]/mL (ref 0.30–0.70)

## 2016-05-17 LAB — BRAIN NATRIURETIC PEPTIDE: B Natriuretic Peptide: 1114.6 pg/mL — ABNORMAL HIGH (ref 0.0–100.0)

## 2016-05-17 LAB — D-DIMER, QUANTITATIVE: D-Dimer, Quant: 2.32 ug/mL-FEU — ABNORMAL HIGH (ref 0.00–0.50)

## 2016-05-17 MED ORDER — METOPROLOL SUCCINATE ER 25 MG PO TB24
25.0000 mg | ORAL_TABLET | Freq: Every day | ORAL | Status: DC
  Administered 2016-05-17: 25 mg via ORAL
  Filled 2016-05-17 (×2): qty 1

## 2016-05-17 MED ORDER — FUROSEMIDE 10 MG/ML IJ SOLN
80.0000 mg | Freq: Two times a day (BID) | INTRAMUSCULAR | Status: DC
  Administered 2016-05-17 – 2016-05-19 (×4): 80 mg via INTRAVENOUS
  Filled 2016-05-17 (×4): qty 8

## 2016-05-17 MED ORDER — OMEGA-3-ACID ETHYL ESTERS 1 G PO CAPS
1.0000 g | ORAL_CAPSULE | Freq: Two times a day (BID) | ORAL | Status: DC
  Administered 2016-05-17 – 2016-05-20 (×7): 1 g via ORAL
  Filled 2016-05-17 (×7): qty 1

## 2016-05-17 MED ORDER — NITROGLYCERIN 0.4 MG SL SUBL: 0.4000 mg | SUBLINGUAL_TABLET | SUBLINGUAL | Status: DC | PRN

## 2016-05-17 MED ORDER — ADULT MULTIVITAMIN W/MINERALS CH
1.0000 | ORAL_TABLET | Freq: Every day | ORAL | Status: DC
  Administered 2016-05-17 – 2016-05-20 (×4): 1 via ORAL
  Filled 2016-05-17 (×4): qty 1

## 2016-05-17 MED ORDER — ACETAMINOPHEN 325 MG PO TABS: 650.0000 mg | ORAL_TABLET | Freq: Four times a day (QID) | ORAL | Status: DC | PRN

## 2016-05-17 MED ORDER — HEPARIN BOLUS VIA INFUSION
2000.0000 [IU] | Freq: Once | INTRAVENOUS | Status: AC
Start: 2016-05-17 — End: 2016-05-17
  Administered 2016-05-17: 2000 [IU] via INTRAVENOUS
  Filled 2016-05-17: qty 2000

## 2016-05-17 MED ORDER — GLUCOSAMINE HCL 1500 MG PO TABS: 1500.0000 mg | ORAL_TABLET | Freq: Two times a day (BID) | ORAL | Status: DC

## 2016-05-17 MED ORDER — ORAL CARE MOUTH RINSE
15.0000 mL | Freq: Two times a day (BID) | OROMUCOSAL | Status: DC
  Administered 2016-05-17 – 2016-05-20 (×7): 15 mL via OROMUCOSAL

## 2016-05-17 MED ORDER — GLIMEPIRIDE 1 MG PO TABS
1.0000 mg | ORAL_TABLET | Freq: Every day | ORAL | Status: DC
  Administered 2016-05-18 – 2016-05-20 (×3): 1 mg via ORAL
  Filled 2016-05-17 (×3): qty 1

## 2016-05-17 MED ORDER — FUROSEMIDE 10 MG/ML IJ SOLN: 40.0000 mg | Freq: Once | INTRAMUSCULAR | Status: DC

## 2016-05-17 MED ORDER — FOLIC ACID 400 MCG PO TABS: 400.0000 ug | ORAL_TABLET | Freq: Every day | ORAL | Status: DC

## 2016-05-17 MED ORDER — FUROSEMIDE 10 MG/ML IJ SOLN
80.0000 mg | Freq: Once | INTRAMUSCULAR | Status: AC
  Administered 2016-05-17: 80 mg via INTRAVENOUS
  Filled 2016-05-17: qty 8

## 2016-05-17 MED ORDER — AMIODARONE HCL 200 MG PO TABS
200.0000 mg | ORAL_TABLET | Freq: Every morning | ORAL | Status: DC
  Administered 2016-05-17 – 2016-05-20 (×4): 200 mg via ORAL
  Filled 2016-05-17 (×4): qty 1

## 2016-05-17 MED ORDER — HEPARIN (PORCINE) IN NACL 100-0.45 UNIT/ML-% IJ SOLN
1500.0000 [IU]/h | INTRAMUSCULAR | Status: DC
  Administered 2016-05-17 – 2016-05-18 (×2): 1500 [IU]/h via INTRAVENOUS
  Filled 2016-05-17 (×2): qty 250

## 2016-05-17 MED ORDER — FISH OIL 1200 MG PO CAPS: 1200.0000 mg | ORAL_CAPSULE | Freq: Two times a day (BID) | ORAL | Status: DC

## 2016-05-17 MED ORDER — HYDROCODONE-ACETAMINOPHEN 5-325 MG PO TABS: 1.0000 | ORAL_TABLET | Freq: Four times a day (QID) | ORAL | Status: DC | PRN

## 2016-05-17 MED ORDER — POLYVINYL ALCOHOL 1.4 % OP SOLN: 1.0000 [drp] | Freq: Three times a day (TID) | OPHTHALMIC | Status: DC | PRN

## 2016-05-17 MED ORDER — ACETAMINOPHEN 650 MG RE SUPP: 650.0000 mg | Freq: Four times a day (QID) | RECTAL | Status: DC | PRN

## 2016-05-17 MED ORDER — FOLIC ACID 1 MG PO TABS
0.5000 mg | ORAL_TABLET | Freq: Every day | ORAL | Status: DC
  Administered 2016-05-17 – 2016-05-20 (×4): 0.5 mg via ORAL
  Filled 2016-05-17 (×4): qty 1

## 2016-05-17 NOTE — H&P (Signed)
History and Physical    Christopher [name] INO:[id number] DOB: May 21, 1926 DOA: 05/16/2016  PCP: Tivis Ringer, MD  Patient coming from: Home.  Chief Complaint: Shortness of breath.  HPI: Christopher [name] is a 83 y.o. male with ESRD stage V had recent right upper extremity fistula placement 3 days ago, CAD status post CABG, pacemaker placement, chronic atrial fibrillation presents to the ER because of worsening shortness of breath over the last 3 days. Denies any chest pain has been a nonproductive cough since the procedure for the right upper extremity AV fistula. Patient did not have any fever or chills. Patient shortness of breath increases on exertion. Patient states he has been compliant with his Lasix dose. His Coumadin was on hold for the procedure and has been taking it again last 2 nights.   ED Course: Chest x-ray in the ER shows features concerning for fluid overload. On exam patient has elevated JVD. Patient was given Lasix 80 mg IV in the ER. Admitted for further management of shortness of breath most likely from fluid overload.  Review of Systems: As per HPI, rest all negative.   Past Medical History:  Diagnosis Date  . Anemia   . Atrial fibrillation ([place]) 08/07/2011   CHADS score  2 CHADS2 VASC score 4  Onset winter of 2013 Cardioversion May 2013 Reversion to a fib 3/14 amiodarone begun 07/02/12  Repeat cardioversion Jul 16, 2012    . BPH (benign prostatic hyperplasia)   . CAD (coronary artery disease)    followed by Dr. Wynonia Lawman  . CAD (coronary artery disease) 03/06/2010   CABG with LIMA to LAD, SVG to OM-OM2  05/05/01 Dr. Roxan Hockey  Cardiac Cath 12/2009 60% left main, occluded LAD, Widely patent SVG to OM1 and 2, patent LIMA, widely patent RCA    . Cardiac pacemaker in situ    07/26/10  Inserted for chronotropic incompetence and dyspnea  RA lead  Medtronic 5076  serial number [id number]   RV lead  Medtronic 5076  serial number [id number] Medtronic Adapta RL pulse generator, SN   [id number]    . CHF (congestive heart failure) ([place])   . Chronic kidney disease    kidneys function at "20 %" followed by Dr. Mercy Moore  . Diabetes mellitus without complication ([place])    Type II  . Dysrhythmia   . History of skin cancer   . HTN (hypertension)   . Hypertensive heart disease without CHF   . Lumbar disc disease   . Lumbar disc disease 1992  . Pacemaker    Medtronic-dual-chamber-DOI 2012  . Prostate cancer ([place])   . Shortness of breath dyspnea    with exertion  . Sinus node dysfunction (HCC)    symptomatic bradycardia    Past Surgical History:  Procedure Laterality Date  . APPENDECTOMY  1980  . AV FISTULA PLACEMENT Left 11/08/2015   Procedure: ARTERIOVENOUS (AV) FISTULA CREATION;  Surgeon: [name], MD;  Location: [place];  Service: Vascular;  Laterality: Left;  . AV FISTULA PLACEMENT Left 01/03/2016   Procedure: Creation of BRACHIOCEPHALIC ARTERIOVENOUS (AV) FISTULA Left arm;  Surgeon: [name], MD;  Location[facility] OR;  Service: Vascular;  Laterality: Left;  . AV FISTULA PLACEMENT Right 05/14/2016   Procedure: RADIOCEPHALIC ARTERIOVENOUS (AV) FISTULA CREATION RIGHT LOWER ARM;  Surgeon: Elam Dutch, MD;  Location: [place];  Service: Vascular;  Laterality: Right;  . CARDIAC CATHETERIZATION  2003  . CARDIOVERSION  08/08/2011   Procedure: CARDIOVERSION;  Surgeon: Grace Bushy  Wynonia Lawman, MD;  Location: [place];  Service: Cardiovascular;  Laterality: N/A;  . CARDIOVERSION N/A 07/16/2012   Procedure: CARDIOVERSION;  Surgeon: Jacolyn Reedy, MD;  Location: [facility] ENDOSCOPY;  Service: Cardiovascular;  Laterality: N/A;  . CARDIOVERSION N/A 02/27/2015   Procedure: CARDIOVERSION;  Surgeon: Jacolyn Reedy, MD;  Location: [facility] ENDOSCOPY;  Service: Cardiovascular;  Laterality: N/A;  . CARDIOVERSION N/A 09/21/2015   Procedure: CARDIOVERSION;  Surgeon: Jacolyn Reedy, MD;  Location: [facility] ENDOSCOPY;  Service: Cardiovascular;  Laterality: N/A;  . CARDIOVERSION N/A 02/22/2016   Procedure:  CARDIOVERSION;  Surgeon: Dorothy Spark, MD;  Location: [facility] ENDOSCOPY;  Service: Cardiovascular;  Laterality: N/A;  . cataracts removed    . COLONOSCOPY    . CORONARY ARTERY BYPASS GRAFT  2003   x3  . CYSTOSCOPY WITH BIOPSY N/A 06/21/2013   Procedure: CYSTOSCOPY WITH BIOPSY;  Surgeon: Molli Hazard, MD;  Location: [facility] ORS;  Service: Urology;  Laterality: N/A;    TUR RESECTION OF POLYP BILATERAL RETROGRADE PYELOGRAM BLADDER BIOPSY    . EYE SURGERY Bilateral    Cataracts  . HIP PINNING,CANNULATED Right 06/30/2014   Procedure: CANNULATED HIP PINNING;  Surgeon: Renette Butters, MD;  Location: [place];  Service: Orthopedics;  Laterality: Right;  . LIGATION OF ARTERIOVENOUS  FISTULA Left 01/17/2016   Procedure: LIGATION OF ARTERIOVENOUS  FISTULA UPPER ARM FISTULA;  Surgeon: [name], MD;  Location: [place];  Service: Vascular;  Laterality: Left;  . LUMBAR LAMINECTOMY  11/01  . PACEMAKER INSERTION  2012  . PROSTATE BIOPSY N/A 06/21/2013   Procedure: BIOPSY TRANSRECTAL ULTRASONIC PROSTATE (TUBP);  Surgeon: Molli Hazard, MD;  Location: [facility] OR[facility];  Service: Urology;  Laterality: N/A;  PROSTATE NERVE BLOCK     reports that he quit smoking about 48 years ago. His smoking use included Cigarettes. He has a 18.00 pack-year smoking history. He quit smokeless tobacco use about 48 years ago. His smokeless tobacco use included Snuff and Chew. He reports that he does not drink alcohol or use drugs.  Allergies  Allergen Reactions  . Crestor [Rosuvastatin] Other (See Comments)    "ACHING JOINTS"  . Lipitor [Atorvastatin] Other (See Comments)    "ACHING JOINTS"  . Statins Other (See Comments)    "ACHING JOINTS"  . Penicillins Rash and Other (See Comments)    Has patient had a PCN reaction causing immediate rash, facial/tongue/throat swelling, SOB or lightheadedness with hypotension: yes Has patient had a PCN reaction causing severe rash involving mucus membranes or skin necrosis: no Has  patient had a PCN reaction that required hospitalization no Has patient had a PCN reaction occurring within the last 10 years: no If all of the above answers are "NO", then may proceed with Cephalosporin use.   . Tramadol Nausea Only    Family History  Problem Relation Age of Onset  . Cancer Mother     breast  . Cancer Father     prostate, esophagus    Prior to Admission medications   Medication Sig Start Date End Date Taking? Authorizing Provider  amiodarone (PACERONE) 200 MG tablet Take 1 tablet (200 mg total) by mouth every morning. 02/28/15  Yes Jacolyn Reedy, MD  carboxymethylcellulose (REFRESH PLUS) 0.5 % SOLN Place 1 drop into both eyes 3 (three) times daily as needed (for dry eyes.).   Yes Historical Provider, MD  Darbepoetin Alfa (ARANESP) 100 MCG/0.5ML SOSY injection Inject 100 mcg into the skin every 21 ( twenty-one) days.   Yes Historical  Provider, MD  folic acid (FOLVITE) 656 MCG tablet Take 400 mcg by mouth daily.     Yes Historical Provider, MD  furosemide (LASIX) 80 MG tablet Take 1 tablet (80 mg total) by mouth 2 (two) times daily. 01/03/16  Yes Samantha J Rhyne, PA-C  glimepiride (AMARYL) 1 MG tablet Take 1 tablet (1 mg total) by mouth daily before breakfast. 07/13/14  Yes Ivan Anchors Love, PA-C  Glucosamine HCl 1500 MG TABS Take 1,500 mg by mouth 2 (two) times daily.    Yes Historical Provider, MD  [place] 300 MG CAPS Take 300 mg by mouth daily.    Yes Historical Provider, MD  HYDROcodone-acetaminophen (NORCO/VICODIN) 5-325 MG tablet Take 1 tablet by mouth every 6 (six) hours as needed. For pain. 05/14/16  Yes Alvia Grove, PA-C  metoprolol succinate (TOPROL-XL) 25 MG 24 hr tablet Take 25 mg by mouth daily. 11/30/15  Yes Historical Provider, MD  Multiple Vitamin (MULTIVITAMIN WITH MINERALS) TABS tablet Take 1 tablet by mouth daily.   Yes Historical Provider, MD  nitroGLYCERIN (NITROSTAT) 0.4 MG SL tablet Place 0.4 mg under the tongue every 5 (five) minutes as  needed for chest pain. 10/26/15  Yes Historical Provider, MD  Omega-3 Fatty Acids (FISH OIL) 1200 MG CAPS Take 1,200 mg by mouth 2 (two) times daily.    Yes Historical Provider, MD  Polyethylene Glycol 3350 (MIRALAX PO) Take 17 g by mouth daily.    Yes Historical Provider, MD  warfarin (COUMADIN) 3 MG tablet Take 1 tablet (3 mg total) by mouth daily. 05/15/16  Yes Alvia Grove, PA-C    Physical Exam: Vitals:   05/17/16 0300 05/17/16 0315 05/17/16 0330 05/17/16 0400  BP: 113/70 129/72 112/64 108/65  Pulse: 69 82 70 71  Resp: 19 26 21 17   Temp:      TempSrc:      SpO2: 99% 99% 96% 98%      Constitutional: Moderately built and nourished. Vitals:   05/17/16 0300 05/17/16 0315 05/17/16 0330 05/17/16 0400  BP: 113/70 129/72 112/64 108/65  Pulse: 69 82 70 71  Resp: 19 26 21 17   Temp:      TempSrc:      SpO2: 99% 99% 96% 98%   Eyes: Anicteric no pallor. ENMT: No discharge from the ears eyes nose or mouth. Neck: JVD elevated no mass felt. Respiratory: No rhonchi. Basilar crepitations. Cardiovascular: S1-S2 heard no murmurs appreciated. Abdomen: Soft nontender bowel sounds present. Musculoskeletal: No edema. No joint effusion. Skin: No rash. Skin appears warm. Neurologic: Alert awake oriented to time place and person. Moves all extremities. Psychiatric: Appears normal. Normal affect.   Labs on Admission: I have personally reviewed following labs and imaging studies  CBC:  Recent Labs Lab 05/14/16 0634 05/17/16 0036  WBC  --  9.9  HGB 10.9* 10.3*  HCT 32.0* 33.7*  MCV  --  100.9*  PLT  --  812   Basic Metabolic Panel:  Recent Labs Lab 05/14/16 0634 05/17/16 0036  NA 141 139  K 4.8 5.4*  CL  --  105  CO2  --  21*  GLUCOSE 124* 141*  BUN  --  95*  CREATININE  --  4.14*  CALCIUM  --  8.9   GFR: Estimated Creatinine Clearance: 16.4 mL/min (by C-G formula based on SCr of 4.14 mg/dL (H)). Liver Function Tests: No results for input(s): AST, ALT, ALKPHOS,  BILITOT, PROT, ALBUMIN in the last 168 hours. No results for input(s): LIPASE, AMYLASE  in the last 168 hours. No results for input(s): AMMONIA in the last 168 hours. Coagulation Profile:  Recent Labs Lab 05/14/16 0631  INR 1.31   Cardiac Enzymes:  Recent Labs Lab 05/17/16 0036  TROPONINI 0.04*   BNP (last 3 results) No results for input(s): PROBNP in the last 8760 hours. HbA1C: No results for input(s): HGBA1C in the last 72 hours. CBG:  Recent Labs Lab 05/14/16 0601 05/14/16 0858  GLUCAP 125* 105*   Lipid Profile: No results for input(s): CHOL, HDL, LDLCALC, TRIG, CHOLHDL, LDLDIRECT in the last 72 hours. Thyroid Function Tests: No results for input(s): TSH, T4TOTAL, FREET4, T3FREE, THYROIDAB in the last 72 hours. Anemia Panel: No results for input(s): VITAMINB12, FOLATE, FERRITIN, TIBC, IRON, RETICCTPCT in the last 72 hours. Urine analysis:    Component Value Date/Time   COLORURINE YELLOW 05/17/2016 0009   APPEARANCEUR CLEAR 05/17/2016 0009   LABSPEC 1.011 05/17/2016 0009   PHURINE 5.0 05/17/2016 0009   GLUCOSEU NEGATIVE 05/17/2016 0009   HGBUR NEGATIVE 05/17/2016 0009   BILIRUBINUR NEGATIVE 05/17/2016 0009   KETONESUR NEGATIVE 05/17/2016 0009   PROTEINUR NEGATIVE 05/17/2016 0009   UROBILINOGEN 0.2 06/29/2014 2147   NITRITE NEGATIVE 05/17/2016 0009   LEUKOCYTESUR NEGATIVE 05/17/2016 0009   Sepsis Labs: @LABRCNTIP (procalcitonin:4,lacticidven:4) )No results found for this or any previous visit (from the past 240 hour(s)).   Radiological Exams on Admission: Dg Chest 2 View  Result Date: 05/17/2016 CLINICAL DATA:  Dyspnea for several days. EXAM: CHEST  2 VIEW COMPARISON:  07/20/2015 FINDINGS: Stable right hemidiaphragm elevation. Unchanged cardiomegaly. Mild interstitial prominence without confluent airspace consolidation. No effusions. The transvenous cardiac leads appear grossly intact. There is prior sternotomy and CABG. IMPRESSION: Cardiomegaly. Mild  interstitial prominence could represent a degree of interstitial edema or interstitial fluid but there is no airspace consolidation or pleural effusion. Electronically Signed   By: Andreas Newport M.D.   On: 05/17/2016 02:14    EKG: Independently reviewed. A. fib RBBB. Rate controlled.  Assessment/Plan Principal Problem:   Acute pulmonary edema (HCC) Active Problems:   CAD (coronary artery disease)   Cardiac pacemaker in situ   Atrial fibrillation (HCC)   Chronic kidney disease (CKD), stage IV (severe) (HCC)   Chronic diastolic congestive heart failure, NYHA class 2 (HCC)   Anemia of chronic disease    1. Acute respiratory failure with hypoxia likely from pulmonary edema - I have continued patient on Lasix 80 mg IV every 12. Since patient also was recently off Coumadin and patient usually not very ambulatory due to his left hip fracture being managed nonsurgically will get a d-dimer and if positive. VQ scan. Will cycle cardiac markers follow daily intake output and metabolic panel. Last EF measured was in 2015 showed EF of 55-60%. Patient is probably progressing to need dialysis. 2. Atrial fibrillation - chest 2 vasc score of 4. For now I will place patient on heparin if INR is less than 2. Patient is on amiodarone and metoprolol. Has had cardioversion in December 2017. Presently in A. fib. 3. CAD status post CABG - denies any chest pain. Troponin mildly elevated for which we will cycle cardiac markers. Troponin elevation likely from CHF. 4. Diabetes mellitus type 2 - patient is on Nipride and sliding-scale coverage. Closely follow CBGs. 5. Chronic anemia probably from ESRD - follow CBC. 6. History of pacemaker placement.   DVT prophylaxis: Heparin. Code Status: Full code.  Family Communication: Patient's wife.  Disposition Plan: Home.  Consults called: None.  Admission status: Inpatient.  Rise Patience MD [facility] Pager [phone].  If 7PM-7AM, please  contact night-coverage [email or web address] Password [facility]  05/17/2016, 5:44 AM

## 2016-05-17 NOTE — Progress Notes (Signed)
ANTICOAGULATION CONSULT NOTE - Initial Consult  Pharmacy Consult for heparin Indication: atrial fibrillation  Allergies  Allergen Reactions  . Crestor [Rosuvastatin] Other (See Comments)    "ACHING JOINTS"  . Lipitor [Atorvastatin] Other (See Comments)    "ACHING JOINTS"  . Statins Other (See Comments)    "ACHING JOINTS"  . Penicillins Rash and Other (See Comments)    Has patient had a PCN reaction causing immediate rash, facial/tongue/throat swelling, SOB or lightheadedness with hypotension: yes Has patient had a PCN reaction causing severe rash involving mucus membranes or skin necrosis: no Has patient had a PCN reaction that required hospitalization no Has patient had a PCN reaction occurring within the last 10 years: no If all of the above answers are "NO", then may proceed with Cephalosporin use.   . Tramadol Nausea Only    Patient Measurements: Height: 6\' 2"  (188 cm) Weight: 235 lb 14.3 oz (107 kg) IBW/kg (Calculated) : 82.2 Heparin Dosing Weight: 105kg  Vital Signs: Temp: 97.6 F (36.4 C) (03/09 0018) Temp Source: Oral (03/09 0018) BP: 113/64 (03/09 0630) Pulse Rate: 71 (03/09 0630)  Labs:  Recent Labs  05/17/16 0036 05/17/16 0626  HGB 10.3* 9.8*  HCT 33.7* 33.8*  PLT 226 218  LABPROT  --  16.0*  INR  --  1.27  CREATININE 4.14*  --   TROPONINI 0.04*  --     Estimated Creatinine Clearance: 16.4 mL/min (by C-G formula based on SCr of 4.14 mg/dL (H)).   Medical History: Past Medical History:  Diagnosis Date  . Anemia   . Atrial fibrillation ([place]) 08/07/2011   CHADS score  2 CHADS2 VASC score 4  Onset winter of 2013 Cardioversion May 2013 Reversion to a fib 3/14 amiodarone begun 07/02/12  Repeat cardioversion Jul 16, 2012    . BPH (benign prostatic hyperplasia)   . CAD (coronary artery disease)    followed by Dr. Wynonia Lawman  . CAD (coronary artery disease) 03/06/2010   CABG with LIMA to LAD, SVG to OM-OM2  05/05/01 Dr. Roxan Hockey  Cardiac Cath 12/2009 60%  left main, occluded LAD, Widely patent SVG to OM1 and 2, patent LIMA, widely patent RCA    . Cardiac pacemaker in situ    07/26/10  Inserted for chronotropic incompetence and dyspnea  RA lead  Medtronic 5076  serial number [id number]   RV lead  Medtronic 5076  serial number [id number] Medtronic Adapta RL pulse generator, SN  [id number]    . CHF (congestive heart failure) ([place])   . Chronic kidney disease    kidneys function at "20 %" followed by Dr. Mercy Moore  . Diabetes mellitus without complication ([place])    Type II  . Dysrhythmia   . History of skin cancer   . HTN (hypertension)   . Hypertensive heart disease without CHF   . Lumbar disc disease   . Lumbar disc disease 1992  . Pacemaker    Medtronic-dual-chamber-DOI 2012  . Prostate cancer ([place])   . Shortness of breath dyspnea    with exertion  . Sinus node dysfunction (HCC)    symptomatic bradycardia    Assessment: 83yo male c/o worsening SOB over 3d, admitted for acute pulmonary edema, to begin heparin while Coumadin on hold; had held Coumadin prior to vascular surgery but has resumed for the last two days, INR below goal.  Goal of Therapy:  Heparin level 0.3-0.7 units/ml Monitor platelets by anticoagulation protocol: Yes   Plan:  Will give small bolus of heparin 2000 units followed by  gtt at 1500 units/hr and monitor heparin levels and CBC.  Wynona Neat, PharmD, BCPS  05/17/2016,7:03 AM

## 2016-05-17 NOTE — Consult Note (Signed)
Christopher [name] is an 83 y.o. male referred by Dr Rama   Chief Complaint: CKD 5, anemia HPI: 83yo WM admitted for increasing SOB.  I see him for CKD 5 sec DM/HTN/age and last seen 04/29/16.  Scr was stable at 3 and BP controlled.  Had new AVF placed 05/14/16 and says SOB started that night.  He was off coumadin for procedure but resumed Wed night.  Admits to being compliant with meds and was taking lasix 80mg  BID.  ? How much fluid he may have received with surgery. Scr sl higher on admission at 4.1.  He has noticed no change in UO.  CXR shows pulm edema.    Past Medical History:  Diagnosis Date  . Anemia   . Atrial fibrillation ([place]) 08/07/2011   CHADS score  2 CHADS2 VASC score 4  Onset winter of 2013 Cardioversion May 2013 Reversion to a fib 3/14 amiodarone begun 07/02/12  Repeat cardioversion Jul 16, 2012    . BPH (benign prostatic hyperplasia)   . CAD (coronary artery disease)    followed by Dr. Wynonia Lawman  . CAD (coronary artery disease) 03/06/2010   CABG with LIMA to LAD, SVG to OM-OM2  05/05/01 Dr. Roxan Hockey  Cardiac Cath 12/2009 60% left main, occluded LAD, Widely patent SVG to OM1 and 2, patent LIMA, widely patent RCA    . Cardiac pacemaker in situ    07/26/10  Inserted for chronotropic incompetence and dyspnea  RA lead  Medtronic 5076  serial number [id number]   RV lead  Medtronic 5076  serial number [id number] Medtronic Adapta RL pulse generator, SN  [id number]    . CHF (congestive heart failure) ([place])   . Chronic kidney disease    kidneys function at "20 %" followed by Dr. Mercy Moore  . Diabetes mellitus without complication ([place])    Type II  . Dysrhythmia   . History of skin cancer   . HTN (hypertension)   . Hypertensive heart disease without CHF   . Lumbar disc disease   . Lumbar disc disease 1992  . Pacemaker    Medtronic-dual-chamber-DOI 2012  . Prostate cancer ([place])   . Shortness of breath dyspnea    with exertion  . Sinus node dysfunction (HCC)    symptomatic bradycardia     Past Surgical History:  Procedure Laterality Date  . APPENDECTOMY  1980  . AV FISTULA PLACEMENT Left 11/08/2015   Procedure: ARTERIOVENOUS (AV) FISTULA CREATION;  Surgeon: [name], MD;  Location: [place];  Service: Vascular;  Laterality: Left;  . AV FISTULA PLACEMENT Left 01/03/2016   Procedure: Creation of BRACHIOCEPHALIC ARTERIOVENOUS (AV) FISTULA Left arm;  Surgeon: [name], MD;  Location: [facility] OR;  Service: Vascular;  Laterality: Left;  . AV FISTULA PLACEMENT Right 05/14/2016   Procedure: RADIOCEPHALIC ARTERIOVENOUS (AV) FISTULA CREATION RIGHT LOWER ARM;  Surgeon: Elam Dutch, MD;  Location: [place];  Service: Vascular;  Laterality: Right;  . CARDIAC CATHETERIZATION  2003  . CARDIOVERSION  08/08/2011   Procedure: CARDIOVERSION;  Surgeon: Jacolyn Reedy, MD;  Location: [place];  Service: Cardiovascular;  Laterality: N/A;  . CARDIOVERSION N/A 07/16/2012   Procedure: CARDIOVERSION;  Surgeon: Jacolyn Reedy, MD;  Location: [facility] ENDOSCOPY;  Service: Cardiovascular;  Laterality: N/A;  . CARDIOVERSION N/A 02/27/2015   Procedure: CARDIOVERSION;  Surgeon: Jacolyn Reedy, MD;  Location: [facility] ENDOSCOPY;  Service: Cardiovascular;  Laterality: N/A;  . CARDIOVERSION N/A 09/21/2015   Procedure: CARDIOVERSION;  Surgeon: Jacolyn Reedy, MD;  Location: [place];  Service: Cardiovascular;  Laterality: N/A;  . CARDIOVERSION N/A 02/22/2016   Procedure: CARDIOVERSION;  Surgeon: Dorothy Spark, MD;  Location: [facility] ENDOSCOPY;  Service: Cardiovascular;  Laterality: N/A;  . cataracts removed    . COLONOSCOPY    . CORONARY ARTERY BYPASS GRAFT  2003   x3  . CYSTOSCOPY WITH BIOPSY N/A 06/21/2013   Procedure: CYSTOSCOPY WITH BIOPSY;  Surgeon: Molli Hazard, MD;  Location: [facility] ORS;  Service: Urology;  Laterality: N/A;    TUR RESECTION OF POLYP BILATERAL RETROGRADE PYELOGRAM BLADDER BIOPSY    . EYE SURGERY Bilateral    Cataracts  . HIP PINNING,CANNULATED Right 06/30/2014   Procedure:  CANNULATED HIP PINNING;  Surgeon: Renette Butters, MD;  Location: [place];  Service: Orthopedics;  Laterality: Right;  . LIGATION OF ARTERIOVENOUS  FISTULA Left 01/17/2016   Procedure: LIGATION OF ARTERIOVENOUS  FISTULA UPPER ARM FISTULA;  Surgeon: [name], MD;  Location: [place];  Service: Vascular;  Laterality: Left;  . LUMBAR LAMINECTOMY  11/01  . PACEMAKER INSERTION  2012  . PROSTATE BIOPSY N/A 06/21/2013   Procedure: BIOPSY TRANSRECTAL ULTRASONIC PROSTATE (TUBP);  Surgeon: Molli Hazard, MD;  Location: [facility] ORS;  Service: Urology;  Laterality: N/A;  PROSTATE NERVE BLOCK    Family History  Problem Relation Age of Onset  . Cancer Mother     breast  . Cancer Father     prostate, esophagus   Social History:  reports that he quit smoking about 48 years ago. His smoking use included Cigarettes. He has a 18.00 pack-year smoking history. He quit smokeless tobacco use about 48 years ago. His smokeless tobacco use included Snuff and Chew. He reports that he does not drink alcohol or use drugs. Married, lives with wife  Allergies:  Allergies  Allergen Reactions  . Crestor [Rosuvastatin] Other (See Comments)    "ACHING JOINTS"  . Lipitor [Atorvastatin] Other (See Comments)    "ACHING JOINTS"  . Statins Other (See Comments)    "ACHING JOINTS"  . Penicillins Rash and Other (See Comments)    Has patient had a PCN reaction causing immediate rash, facial/tongue/throat swelling, SOB or lightheadedness with hypotension: yes Has patient had a PCN reaction causing severe rash involving mucus membranes or skin necrosis: no Has patient had a PCN reaction that required hospitalization no Has patient had a PCN reaction occurring within the last 10 years: no If all of the above answers are "NO", then may proceed with Cephalosporin use.   . Tramadol Nausea Only    Medications Prior to Admission  Medication Sig Dispense Refill  . amiodarone (PACERONE) 200 MG tablet Take 1 tablet (200 mg total)  by mouth every morning.    . carboxymethylcellulose (REFRESH PLUS) 0.5 % SOLN Place 1 drop into both eyes 3 (three) times daily as needed (for dry eyes.).    . Darbepoetin Alfa (ARANESP) 100 MCG/0.5ML SOSY injection Inject 100 mcg into the skin every 21 ( twenty-one) days.    . folic acid (FOLVITE) 324 MCG tablet Take 400 mcg by mouth daily.      . furosemide (LASIX) 80 MG tablet Take 1 tablet (80 mg total) by mouth 2 (two) times daily.    Marland Kitchen glimepiride (AMARYL) 1 MG tablet Take 1 tablet (1 mg total) by mouth daily before breakfast. 30 tablet 1  . Glucosamine HCl 1500 MG TABS Take 1,500 mg by mouth 2 (two) times daily.     . Horse Chestnut 300 MG  CAPS Take 300 mg by mouth daily.     Marland Kitchen HYDROcodone-acetaminophen (NORCO/VICODIN) 5-325 MG tablet Take 1 tablet by mouth every 6 (six) hours as needed. For pain. 6 tablet 0  . metoprolol succinate (TOPROL-XL) 25 MG 24 hr tablet Take 25 mg by mouth daily.    . Multiple Vitamin (MULTIVITAMIN WITH MINERALS) TABS tablet Take 1 tablet by mouth daily.    . nitroGLYCERIN (NITROSTAT) 0.4 MG SL tablet Place 0.4 mg under the tongue every 5 (five) minutes as needed for chest pain.    . Omega-3 Fatty Acids (FISH OIL) 1200 MG CAPS Take 1,200 mg by mouth 2 (two) times daily.     . Polyethylene Glycol 3350 (MIRALAX PO) Take 17 g by mouth daily.     Marland Kitchen warfarin (COUMADIN) 3 MG tablet Take 1 tablet (3 mg total) by mouth daily.       Lab Results: UA: neg  Recent Labs  05/17/16 0036 05/17/16 0626  WBC 9.9 9.8  HGB 10.3* 9.8*  HCT 33.7* 33.8*  PLT 226 218   BMET  Recent Labs  05/17/16 0036 05/17/16 0626  NA 139 138  K 5.4* 5.0  CL 105 106  CO2 21* 23  GLUCOSE 141* 79  BUN 95* 94*  CREATININE 4.14* 3.99*  CALCIUM 8.9 8.8*   LFT  Recent Labs  05/17/16 0626  PROT 6.1*  ALBUMIN 2.9*  AST 38  ALT 44  ALKPHOS 45  BILITOT 1.0   Dg Chest 2 View  Result Date: 05/17/2016 CLINICAL DATA:  Dyspnea for several days. EXAM: CHEST  2 VIEW COMPARISON:   07/20/2015 FINDINGS: Stable right hemidiaphragm elevation. Unchanged cardiomegaly. Mild interstitial prominence without confluent airspace consolidation. No effusions. The transvenous cardiac leads appear grossly intact. There is prior sternotomy and CABG. IMPRESSION: Cardiomegaly. Mild interstitial prominence could represent a degree of interstitial edema or interstitial fluid but there is no airspace consolidation or pleural effusion. Electronically Signed   By: Andreas Newport M.D.   On: 05/17/2016 02:14    ROS: Appetite has been decreased + SOB No CP No abd pain No dysuria No new arthritic CO   PHYSICAL EXAM: Blood pressure (!) 118/58, pulse 71, temperature 97.4 F (36.3 C), temperature source Oral, resp. rate 20, height 6\' 2"  (1.88 m), weight 111.5 kg (245 lb 12.8 oz), SpO2 97 %. HEENT: PERRLA EOMI NECK:+ JVD LUNGS:Bibasilar crackles CARDIAC:irreg, irreg wo MRG ABD:+ BS NTND No HSM EXT:tr edema around ankles.  Rt forearm AVF + bruit NEURO:CNI Ox3 no asterixis Pacer Lt chest  Assessment: 1. CKD 4 sec DM/HTN/Age 39. Pulm edema 3. Chronic A fib 4. Hx Sec HPTH last PTH Dec was Nl 5. Anemia, on aranesp last injection 05/01/16.  Iron studies OK 05/01/16 PLAN: 1. IV lasix 80mg  BID and can increase from there if needed 2. Daily labs 3. Check PTH   Sherry Blackard T 05/17/2016, 2:39 PM

## 2016-05-17 NOTE — Progress Notes (Addendum)
Progress Note    Christopher [name]  GUY:[id number] DOB: 11/05/1926  DOA: 05/16/2016 PCP: Tivis Ringer, MD    Brief Narrative:   Chief complaint: Dyspnea  Christopher [name] is an 83 y.o. male with ESRD stage V had recent right upper extremity fistula placement 3 days ago, CAD status post CABG, pacemaker placement, chronic atrial fibrillation on coumadin (briefly stopped prior to his AV fistula placement, resumed 2 days ago), who was admitted 05/16/16 with a chief complaint of dyspnea.    Assessment/Plan:   Principal Problem:   Acute pulmonary edema (HCC) in the setting of ESRD and chronic diastolc CHF, NYHA class 2 Has had 400 cc of urine out with IV lasix.  Currently on Lasix 80 mg IV Q 12 hours. Lung exam still with bilateral crackles. Last EF measured was in 2015 showed EF of 55-60%. Discussed care with Dr. Mercy Moore, who has graciously agreed to see him in consultation. Chest x-ray personally reviewed and consistent with significant pulmonary edema. With pulmonary edema on CXR, do not think he has pulmonary embolism.  He is not hypoxic or tachycardic.  Has been on coumadin up until his surgery, and is active otherwise. Will hold off on VQ scan despite mildly elevated D-dimer.   Active Problems:   Hyperkalemia Potassium WNL today.    CAD (coronary artery disease)/s/p CABG/RBBB Mildly elevated troponin, but trend flat. Likely reflecting decreased renal clearance and pulmonary edema. No reports of chest pain.    Atrial fibrillation ([place]) Rate controlled: Amiodarone and metoprolol.  Currently on heparin since INR subtherapeutic.    Anemia of chronic disease Secondary to renal disease. On Aranesp per nephrology.  Diabetes mellitus type 2  Continue Amaryl.  Obesity (BMI 30-39.9) Body mass index is 30.29 kg/m.   Family Communication/Anticipated D/C date and plan/Code Status   DVT prophylaxis: Heparin ordered. Code Status: Full Code.  Family Communication: Wife and 3 sons  updated at the bedside. Disposition Plan: Home when stable.   Medical Consultants:    Nephrology   Procedures:    None  Anti-Infectives:    None  Subjective:   Patient reports that his shortness of breath began after he had his fistula placed on 05/14/16.  No fever or chills. Occasional non-productive cough.  Denies eating extra salt. No chest pain.  Objective:    Vitals:   05/17/16 1215 05/17/16 1230 05/17/16 1245 05/17/16 1300  BP: 109/61 120/57 124/62 126/65  Pulse: 72 71 74 69  Resp: 21 20 21 23   Temp:      TempSrc:      SpO2: 99% 96% 96% 96%  Weight:      Height:        Intake/Output Summary (Last 24 hours) at 05/17/16 1403 Last data filed at 05/17/16 0614  Gross per 24 hour  Intake              240 ml  Output              400 ml  Net             -160 ml   Filed Weights   05/17/16 0700  Weight: 107 kg (235 lb 14.3 oz)    Exam: General exam: Appears calm and comfortable.  Respiratory system: Bibasilar crackles. Respiratory effort normal. Cardiovascular system: HSIR. No rubs, gallops or clicks. No murmurs. Gastrointestinal system: Abdomen is nondistended, soft and nontender. No organomegaly or masses felt. Normal bowel sounds heard. Central nervous system: Alert and oriented.  No focal neurological deficits. Extremities: No clubbing,  or cyanosis. No edema. Fistula right forearm + thrill. Skin: No rashes, lesions or ulcers. Psychiatry: Judgement and insight appear normal. Mood & affect appropriate.   Data Reviewed:   I have personally reviewed following labs and imaging studies:  Labs: Basic Metabolic Panel:  Recent Labs Lab 05/14/16 0634 05/17/16 0036 05/17/16 0626  NA 141 139 138  K 4.8 5.4* 5.0  CL  --  105 106  CO2  --  21* 23  GLUCOSE 124* 141* 79  BUN  --  95* 94*  CREATININE  --  4.14* 3.99*  CALCIUM  --  8.9 8.8*   GFR Estimated Creatinine Clearance: 17 mL/min (by C-G formula based on SCr of 3.99 mg/dL (H)). Liver Function  Tests:  Recent Labs Lab 05/17/16 0626  AST 38  ALT 44  ALKPHOS 45  BILITOT 1.0  PROT 6.1*  ALBUMIN 2.9*   No results for input(s): LIPASE, AMYLASE in the last 168 hours. No results for input(s): AMMONIA in the last 168 hours. Coagulation profile  Recent Labs Lab 05/14/16 0631 05/17/16 0626  INR 1.31 1.27    CBC:  Recent Labs Lab 05/14/16 0634 05/17/16 0036 05/17/16 0626  WBC  --  9.9 9.8  NEUTROABS  --   --  8.4*  HGB 10.9* 10.3* 9.8*  HCT 32.0* 33.7* 33.8*  MCV  --  100.9* 100.0  PLT  --  226 218   Cardiac Enzymes:  Recent Labs Lab 05/17/16 0036 05/17/16 0626 05/17/16 1131  TROPONINI 0.04* 0.03* 0.03*   BNP (last 3 results) No results for input(s): PROBNP in the last 8760 hours. CBG:  Recent Labs Lab 05/14/16 0601 05/14/16 0858  GLUCAP 125* 105*   D-Dimer:  Recent Labs  05/17/16 0626  DDIMER 2.32*   Hgb A1c: No results for input(s): HGBA1C in the last 72 hours. Lipid Profile: No results for input(s): CHOL, HDL, LDLCALC, TRIG, CHOLHDL, LDLDIRECT in the last 72 hours. Thyroid function studies:  Recent Labs  05/17/16 0626  TSH 2.290   Anemia work up: No results for input(s): VITAMINB12, FOLATE, FERRITIN, TIBC, IRON, RETICCTPCT in the last 72 hours. Sepsis Labs:  Recent Labs Lab 05/17/16 0036 05/17/16 0626  WBC 9.9 9.8    Microbiology No results found for this or any previous visit (from the past 240 hour(s)).  Radiology: Dg Chest 2 View  Result Date: 05/17/2016 CLINICAL DATA:  Dyspnea for several days. EXAM: CHEST  2 VIEW COMPARISON:  07/20/2015 FINDINGS: Stable right hemidiaphragm elevation. Unchanged cardiomegaly. Mild interstitial prominence without confluent airspace consolidation. No effusions. The transvenous cardiac leads appear grossly intact. There is prior sternotomy and CABG. IMPRESSION: Cardiomegaly. Mild interstitial prominence could represent a degree of interstitial edema or interstitial fluid but there is no  airspace consolidation or pleural effusion. Electronically Signed   By: Andreas Newport M.D.   On: 05/17/2016 02:14    Medications:   . amiodarone  200 mg Oral q morning - 81E  . folic acid  0.5 mg Oral Daily  . glimepiride  1 mg Oral QAC breakfast  . metoprolol succinate  25 mg Oral Daily  . multivitamin with minerals  1 tablet Oral Daily  . omega-3 acid ethyl esters  1 g Oral BID   Continuous Infusions: . heparin 1,500 Units/hr (05/17/16 0830)    Admitted after midnight, no charge.   LOS: 0 days   RAMA,CHRISTINA  [facility] Pager [phone]. If unable to reach me by pager,  please call my cell phone at [phone].  *Please refer to [email or web address], password [email or web address] to get updated schedule on who will round on this patient, as hospitalists switch teams weekly. If 7PM-7AM, please contact night-coverage at [email or web address], password [email or web address] for any overnight needs.  05/17/2016, 2:03 PM

## 2016-05-17 NOTE — Care Management Note (Signed)
Case Management Note  Patient Details  Name: TERESA LEMMERMAN MRN: [id number] Date of Birth: 1926/12/10  Subjective/Objective:                  From home with spouse. /83 y.o. male with ESRD stage V had recent right upper extremity fistula placement 3 days ago, CAD status post CABG, pacemaker placement, chronic atrial fibrillation presents to the ER because of worsening shortness of breath over the last 3 days.  Action/Plan: Admit status INPATIENT (Acute respiratory failure with hypoxia likely from pulmonary edema); anticipate discharge [place].   Expected Discharge Date:   (unsure)               Expected Discharge Plan:  [place]  In-House Referral:     Discharge planning Services  CM Consult  Post Acute Care Choice:    Choice offered to:     DME Arranged:    DME Agency:     HH Arranged:    HH Agency:     Status of Service:  In process, will continue to follow  If discussed at Long Length of Stay Meetings, dates discussed:    Additional Comments:  Fuller Mandril, RN 05/17/2016, 11:12 AM

## 2016-05-17 NOTE — ED Notes (Signed)
Renal diet ordered for patient.  

## 2016-05-17 NOTE — Progress Notes (Signed)
ANTICOAGULATION CONSULT NOTE - [place] for heparin Indication: atrial fibrillation  Allergies  Allergen Reactions  . Crestor [Rosuvastatin] Other (See Comments)    "ACHING JOINTS"  . Lipitor [Atorvastatin] Other (See Comments)    "ACHING JOINTS"  . Statins Other (See Comments)    "ACHING JOINTS"  . Penicillins Rash and Other (See Comments)    Has patient had a PCN reaction causing immediate rash, facial/tongue/throat swelling, SOB or lightheadedness with hypotension: yes Has patient had a PCN reaction causing severe rash involving mucus membranes or skin necrosis: no Has patient had a PCN reaction that required hospitalization no Has patient had a PCN reaction occurring within the last 10 years: no If all of the above answers are "NO", then may proceed with Cephalosporin use.   . Tramadol Nausea Only   Patient Measurements: Height: 6\' 2"  (188 cm) Weight: 245 lb 12.8 oz (111.5 kg) IBW/kg (Calculated) : 82.2 Heparin Dosing Weight: 105kg  Vital Signs: Temp: 97.4 F (36.3 C) (03/09 1402) Temp Source: Oral (03/09 1402) BP: 118/58 (03/09 1402) Pulse Rate: 71 (03/09 1402)  Labs:  Recent Labs  05/17/16 0036 05/17/16 0626 05/17/16 1131 05/17/16 1556  HGB 10.3* 9.8*  --   --   HCT 33.7* 33.8*  --   --   PLT 226 218  --   --   LABPROT  --  16.0*  --   --   INR  --  1.27  --   --   HEPARINUNFRC  --   --   --  0.61  CREATININE 4.14* 3.99*  --   --   TROPONINI 0.04* 0.03* 0.03*  --    Estimated Creatinine Clearance: 17.3 mL/min (by C-G formula based on SCr of 3.99 mg/dL (H)).  Medical History: Past Medical History:  Diagnosis Date  . Anemia   . Atrial fibrillation ([place]) 08/07/2011   CHADS score  2 CHADS2 VASC score 4  Onset winter of 2013 Cardioversion May 2013 Reversion to a fib 3/14 amiodarone begun 07/02/12  Repeat cardioversion Jul 16, 2012    . BPH (benign prostatic hyperplasia)   . CAD (coronary artery disease)    followed by Dr. Wynonia Lawman  .  CAD (coronary artery disease) 03/06/2010   CABG with LIMA to LAD, SVG to OM-OM2  05/05/01 Dr. Roxan Hockey  Cardiac Cath 12/2009 60% left main, occluded LAD, Widely patent SVG to OM1 and 2, patent LIMA, widely patent RCA    . Cardiac pacemaker in situ    07/26/10  Inserted for chronotropic incompetence and dyspnea  RA lead  Medtronic 5076  serial number [id number]   RV lead  Medtronic 5076  serial number [id number] Medtronic Adapta RL pulse generator, SN  [id number]    . CHF (congestive heart failure) ([place])   . Chronic kidney disease    kidneys function at "20 %" followed by Dr. Mercy Moore  . Diabetes mellitus without complication ([place])    Type II  . Dysrhythmia   . History of skin cancer   . HTN (hypertension)   . Hypertensive heart disease without CHF   . Lumbar disc disease   . Lumbar disc disease 1992  . Pacemaker    Medtronic-dual-chamber-DOI 2012  . Prostate cancer ([place])   . Shortness of breath dyspnea    with exertion  . Sinus node dysfunction (HCC)    symptomatic bradycardia   Assessment: 83yo male c/o worsening SOB over 3d, admitted for acute pulmonary edema, to begin heparin while Coumadin on hold;  had held Coumadin prior to vascular surgery but has resumed for the last two days, INR below goal. CBC stable, no bleeding or infusion related issues reported.  Heparin level therapeutic: 0.61  Goal of Therapy:  Heparin level 0.3-0.7 units/ml Monitor platelets by anticoagulation protocol: Yes   Plan:  Continue heparin gtt at1500 units/hr  Daily heparin level/CBC Monitor for s/sx of bleeding  Georga Bora, PharmD Clinical Pharmacist Pager: [phone] 05/17/2016 5:51 PM

## 2016-05-18 DIAGNOSIS — E669 Obesity, unspecified: Secondary | ICD-10-CM

## 2016-05-18 LAB — RENAL FUNCTION PANEL
Albumin: 2.9 g/dL — ABNORMAL LOW (ref 3.5–5.0)
Anion gap: 13 (ref 5–15)
BUN: 97 mg/dL — AB (ref 6–20)
CHLORIDE: 104 mmol/L (ref 101–111)
CO2: 22 mmol/L (ref 22–32)
CREATININE: 3.56 mg/dL — AB (ref 0.61–1.24)
Calcium: 8.5 mg/dL — ABNORMAL LOW (ref 8.9–10.3)
GFR calc Af Amer: 16 mL/min — ABNORMAL LOW (ref >60)
GFR calc non Af Amer: 14 mL/min — ABNORMAL LOW (ref >60)
Glucose, Bld: 91 mg/dL (ref 65–99)
POTASSIUM: 4.6 mmol/L (ref 3.5–5.1)
Phosphorus: 5.6 mg/dL — ABNORMAL HIGH (ref 2.5–4.6)
Sodium: 139 mmol/L (ref 135–145)

## 2016-05-18 LAB — CBC
HEMATOCRIT: 31 % — AB (ref 39.0–52.0)
Hemoglobin: 9.2 g/dL — ABNORMAL LOW (ref 13.0–17.0)
MCH: 30 pg (ref 26.0–34.0)
MCHC: 29.7 g/dL — ABNORMAL LOW (ref 30.0–36.0)
MCV: 101 fL — AB (ref 78.0–100.0)
PLATELETS: 233 10*3/uL (ref 150–400)
RBC: 3.07 MIL/uL — ABNORMAL LOW (ref 4.22–5.81)
RDW: 20.2 % — AB (ref 11.5–15.5)
WBC: 7.5 10*3/uL (ref 4.0–10.5)

## 2016-05-18 LAB — HEPARIN LEVEL (UNFRACTIONATED): Heparin Unfractionated: 0.33 IU/mL (ref 0.30–0.70)

## 2016-05-18 LAB — GLUCOSE, CAPILLARY: GLUCOSE-CAPILLARY: 95 mg/dL (ref 65–99)

## 2016-05-18 MED ORDER — METOPROLOL SUCCINATE ER 25 MG PO TB24
25.0000 mg | ORAL_TABLET | Freq: Every day | ORAL | Status: DC
  Administered 2016-05-19 – 2016-05-20 (×2): 25 mg via ORAL
  Filled 2016-05-18 (×2): qty 1

## 2016-05-18 MED ORDER — WARFARIN SODIUM 5 MG PO TABS
5.0000 mg | ORAL_TABLET | Freq: Once | ORAL | Status: AC
  Administered 2016-05-18: 5 mg via ORAL
  Filled 2016-05-18: qty 1

## 2016-05-18 MED ORDER — WARFARIN - PHARMACIST DOSING INPATIENT
Freq: Every day | Status: DC
  Administered 2016-05-18 – 2016-05-19 (×2)

## 2016-05-18 NOTE — Progress Notes (Signed)
Dr. Rockne Menghini returned call and instructed to continue to monitor and if any event let her know.  Karie Kirks, RN

## 2016-05-18 NOTE — Progress Notes (Addendum)
Notified Dr. Jonnie Finner pt had slight bleeding on fistula site on Rt wrist area and says arm look little swollen.  Bleeding  subsided.  Arm elevated  on pillow. No new orders given.  Pt also c/o of arm bands being tight on that arm.  Now loose ones applied. Will continue to monitor.  Karie Kirks, Therapist, sports.

## 2016-05-18 NOTE — Progress Notes (Signed)
BP 94/48 with machine and manually 90/40.  Notified Dr. Rockne Menghini ordered change to hold metoprolol for SPB <95 or less and ok to give amiodarone.  Pt made aware and will continue to monitor.  Karie Kirks, Therapist, sports.

## 2016-05-18 NOTE — Progress Notes (Signed)
Patient's IV bleeding. Changed tegaderm so that I could keep an eye on the bleeding/new drainage.

## 2016-05-18 NOTE — Progress Notes (Signed)
New blood under tegaderm, called pharmacy, heparin labs drawn= therapeutic. Will keep an eye on IV site. Wife at bedside, patient an d wife updated on plan of care.

## 2016-05-18 NOTE — Discharge Instructions (Signed)

## 2016-05-18 NOTE — Progress Notes (Signed)
S: Thinks he is breathing better O:BP 117/61 (BP Location: Left Arm)   Pulse 74   Temp 98.1 F (36.7 C) (Oral)   Resp 20   Ht 6\' 2"  (1.88 m)   Wt 110.4 kg (243 lb 6.4 oz) Comment: scale a  SpO2 97%   BMI 31.25 kg/m   Intake/Output Summary (Last 24 hours) at 05/18/16 0809 Last data filed at 05/18/16 0740  Gross per 24 hour  Intake            562.5 ml  Output             1675 ml  Net          -1112.5 ml   Weight change: 4.494 kg (9 lb 14.5 oz) GLO:VFIEP and alert PIR:JJOAC, irreg Resp: Few faint crackles, improved from yest Abd:+ BS NTND Ext: 0-tr around ankles  Rt forearm AVF NEURO:CNI Ox3 no asterixis   . amiodarone  200 mg Oral q morning - 16S  . folic acid  0.5 mg Oral Daily  . furosemide  80 mg Intravenous BID  . glimepiride  1 mg Oral QAC breakfast  . mouth rinse  15 mL Mouth Rinse BID  . metoprolol succinate  25 mg Oral Daily  . multivitamin with minerals  1 tablet Oral Daily  . omega-3 acid ethyl esters  1 g Oral BID  . warfarin  5 mg Oral ONCE-1800  . Warfarin - Pharmacist Dosing Inpatient   Does not apply q1800   Dg Chest 2 View  Result Date: 05/17/2016 CLINICAL DATA:  Dyspnea for several days. EXAM: CHEST  2 VIEW COMPARISON:  07/20/2015 FINDINGS: Stable right hemidiaphragm elevation. Unchanged cardiomegaly. Mild interstitial prominence without confluent airspace consolidation. No effusions. The transvenous cardiac leads appear grossly intact. There is prior sternotomy and CABG. IMPRESSION: Cardiomegaly. Mild interstitial prominence could represent a degree of interstitial edema or interstitial fluid but there is no airspace consolidation or pleural effusion. Electronically Signed   By: Andreas Newport M.D.   On: 05/17/2016 02:14   BMET    Component Value Date/Time   NA 139 05/18/2016 0448   K 4.6 05/18/2016 0448   CL 104 05/18/2016 0448   CO2 22 05/18/2016 0448   GLUCOSE 91 05/18/2016 0448   BUN 97 (H) 05/18/2016 0448   CREATININE 3.56 (H) 05/18/2016 0448    CALCIUM 8.5 (L) 05/18/2016 0448   GFRNONAA 14 (L) 05/18/2016 0448   GFRAA 16 (L) 05/18/2016 0448   CBC    Component Value Date/Time   WBC 7.5 05/18/2016 0448   RBC 3.07 (L) 05/18/2016 0448   HGB 9.2 (L) 05/18/2016 0448   HCT 31.0 (L) 05/18/2016 0448   HCT 29.4 (L) 02/27/2015 1644   PLT 233 05/18/2016 0448   MCV 101.0 (H) 05/18/2016 0448   MCH 30.0 05/18/2016 0448   MCHC 29.7 (L) 05/18/2016 0448   RDW 20.2 (H) 05/18/2016 0448   LYMPHSABS 0.8 05/17/2016 0626   MONOABS 0.6 05/17/2016 0626   EOSABS 0.0 05/17/2016 0626   BASOSABS 0.0 05/17/2016 0626     Assessment: 1. CKD 4 sec DM/HTN/AGE, Scr trending back to baseline, UO only fair though received only 1 dose IV lasix 2. Pulm edema 3. Chronic A fib 4. Anemia on aranesp as oupt, last dose 05/01/16 5. Hx Sec HPTH   PTH level P Plan: 1. See how responds to IV lasix today, can always increase dose if needed 2. Recheck Scr in AM 3. Get up out of bed  Mayfield Schoene T

## 2016-05-18 NOTE — Progress Notes (Signed)
Patient resting quietly. Right arm has no bleeding at fistula site.

## 2016-05-18 NOTE — Progress Notes (Signed)
Notified Dr. Rockne Menghini that pt had a spacer spike in middle of QRS x1 this am. Per central monitor this am.  Karie Kirks, RN.

## 2016-05-18 NOTE — Progress Notes (Signed)
Progress Note    LEMUEL BOODRAM  YFV:[id number] DOB: 04/08/1926  DOA: 05/16/2016 PCP: Tivis Ringer, MD    Brief Narrative:   Chief complaint: Dyspnea  Christopher [name] is an 83 y.o. male with ESRD stage V had recent right upper extremity fistula placement 3 days ago, CAD status post CABG, pacemaker placement, chronic atrial fibrillation on coumadin (briefly stopped prior to his AV fistula placement, resumed 2 days ago), who was admitted 05/16/16 with a chief complaint of dyspnea.    Assessment/Plan:   Principal Problem:   Acute pulmonary edema (HCC) in the setting of ESRD and chronic diastolc CHF, NYHA class 2 Has had 400 cc of urine out with IV lasix.  Currently on Lasix 80 mg IV Q 12 hours. Lung exam still with bilateral crackles. Last EF measured was in 2015 showed EF of 55-60%. Discussed care with Dr. Mercy Moore, who has graciously agreed to see him in consultation. Chest x-ray personally reviewed and consistent with significant pulmonary edema. With pulmonary edema on CXR, do not think he has pulmonary embolism.  He is not hypoxic or tachycardic.  Has been on coumadin up until his surgery, and is active otherwise. Will hold off on VQ scan despite mildly elevated D-dimer.I/O- approximately 1 L. Creatinine has improved.    Active Problems:   Hyperkalemia Potassium remains WNL.    CAD (coronary artery disease)/s/p CABG/RBBB Mildly elevated troponin, but trend flat. Likely reflecting decreased renal clearance and pulmonary edema. No reports of chest pain.    Atrial fibrillation ([place]) Rate controlled: Amiodarone and metoprolol.  Currently on heparin since INR subtherapeutic. Resume Coumadin & D/C heparin.    Anemia of chronic disease Secondary to renal disease. On Aranesp per nephrology.  Diabetes mellitus type 2  Continue Amaryl.  Obesity (BMI 30-39.9) Body mass index is 31.25 kg/m.   Family Communication/Anticipated D/C date and plan/Code Status   DVT prophylaxis:  Coumadin ordered. Code Status: Full Code.  Family Communication: Wife and 3 sons updated at the bedside 05/17/16. Disposition Plan: Home 05/19/16 if breathing improved.   Medical Consultants:    Nephrology   Procedures:    None  Anti-Infectives:    None  Subjective:   Patient thinks he is breathing better but not yet back to baseline. No nausea or vomiting. No chest pain.  Objective:    Vitals:   05/17/16 1402 05/17/16 2010 05/18/16 0008 05/18/16 0628  BP: (!) 118/58 122/64 113/60 117/61  Pulse: 71 73 74 74  Resp: 20 20 20 20   Temp: 97.4 F (36.3 C) 97.5 F (36.4 C) 98.5 F (36.9 C) 98.1 F (36.7 C)  TempSrc: Oral Oral Oral Oral  SpO2: 97% 97% 100% 97%  Weight: 111.5 kg (245 lb 12.8 oz)   110.4 kg (243 lb 6.4 oz)  Height: 6\' 2"  (1.88 m)       Intake/Output Summary (Last 24 hours) at 05/18/16 0739 Last data filed at 05/18/16 0339  Gross per 24 hour  Intake            502.5 ml  Output             1425 ml  Net           -922.5 ml   Filed Weights   05/17/16 0700 05/17/16 1402 05/18/16 0628  Weight: 107 kg (235 lb 14.3 oz) 111.5 kg (245 lb 12.8 oz) 110.4 kg (243 lb 6.4 oz)    Exam: General exam: Appears calm and comfortable.  Respiratory  system: Bibasilar crackles but improving. Respiratory effort normal. Cardiovascular system: HSIR. No rubs, gallops or clicks. No murmurs. Gastrointestinal system: Abdomen is nondistended, soft and nontender. No organomegaly or masses felt. Normal bowel sounds heard. Central nervous system: Alert and oriented. No focal neurological deficits. Extremities: No clubbing,  or cyanosis. No edema. Fistula right forearm + thrill. Skin: No rashes, lesions or ulcers. Psychiatry: Judgement and insight appear normal. Mood & affect appropriate.   Data Reviewed:   I have personally reviewed following labs and imaging studies:  Labs: Basic Metabolic Panel:  Recent Labs Lab 05/14/16 0634 05/17/16 0036 05/17/16 0626 05/18/16 0448    NA 141 139 138 139  K 4.8 5.4* 5.0 4.6  CL  --  105 106 104  CO2  --  21* 23 22  GLUCOSE 124* 141* 79 91  BUN  --  95* 94* 97*  CREATININE  --  4.14* 3.99* 3.56*  CALCIUM  --  8.9 8.8* 8.5*  PHOS  --   --   --  5.6*   GFR Estimated Creatinine Clearance: 19.3 mL/min (by C-G formula based on SCr of 3.56 mg/dL (H)). Liver Function Tests:  Recent Labs Lab 05/17/16 0626 05/18/16 0448  AST 38  --   ALT 44  --   ALKPHOS 45  --   BILITOT 1.0  --   PROT 6.1*  --   ALBUMIN 2.9* 2.9*   No results for input(s): LIPASE, AMYLASE in the last 168 hours. No results for input(s): AMMONIA in the last 168 hours. Coagulation profile  Recent Labs Lab 05/14/16 0631 05/17/16 0626  INR 1.31 1.27    CBC:  Recent Labs Lab 05/14/16 0634 05/17/16 0036 05/17/16 0626 05/18/16 0448  WBC  --  9.9 9.8 7.5  NEUTROABS  --   --  8.4*  --   HGB 10.9* 10.3* 9.8* 9.2*  HCT 32.0* 33.7* 33.8* 31.0*  MCV  --  100.9* 100.0 101.0*  PLT  --  226 218 233   Cardiac Enzymes:  Recent Labs Lab 05/17/16 0036 05/17/16 0626 05/17/16 1131 05/17/16 1748  TROPONINI 0.04* 0.03* 0.03* 0.03*   BNP (last 3 results) No results for input(s): PROBNP in the last 8760 hours. CBG:  Recent Labs Lab 05/14/16 0601 05/14/16 0858  GLUCAP 125* 105*   D-Dimer:  Recent Labs  05/17/16 0626  DDIMER 2.32*   Hgb A1c: No results for input(s): HGBA1C in the last 72 hours. Lipid Profile: No results for input(s): CHOL, HDL, LDLCALC, TRIG, CHOLHDL, LDLDIRECT in the last 72 hours. Thyroid function studies:  Recent Labs  05/17/16 0626  TSH 2.290    Microbiology No results found for this or any previous visit (from the past 240 hour(s)).  Radiology: Dg Chest 2 View  Result Date: 05/17/2016 CLINICAL DATA:  Dyspnea for several days. EXAM: CHEST  2 VIEW COMPARISON:  07/20/2015 FINDINGS: Stable right hemidiaphragm elevation. Unchanged cardiomegaly. Mild interstitial prominence without confluent airspace  consolidation. No effusions. The transvenous cardiac leads appear grossly intact. There is prior sternotomy and CABG. IMPRESSION: Cardiomegaly. Mild interstitial prominence could represent a degree of interstitial edema or interstitial fluid but there is no airspace consolidation or pleural effusion. Electronically Signed   By: Andreas Newport M.D.   On: 05/17/2016 02:14    Medications:   . amiodarone  200 mg Oral q morning - 24O  . folic acid  0.5 mg Oral Daily  . furosemide  80 mg Intravenous BID  . glimepiride  1 mg Oral QAC  breakfast  . mouth rinse  15 mL Mouth Rinse BID  . metoprolol succinate  25 mg Oral Daily  . multivitamin with minerals  1 tablet Oral Daily  . omega-3 acid ethyl esters  1 g Oral BID   Continuous Infusions: . heparin 1,500 Units/hr (05/18/16 0010)    Medical decision making is of high complexity and this patient is at high risk of deterioration, therefore this is a level 3 visit.  (> 4 problem points, 2 points, high risk: severe exacerbation of chronic illness)    LOS: 1 day   Vicenta Olds  [facility] Pager [phone]. If unable to reach me by pager, please call my cell phone at [phone].  *Please refer to [email or web address], password [email or web address] to get updated schedule on who will round on this patient, as hospitalists switch teams weekly. If 7PM-7AM, please contact night-coverage at [email or web address], password [email or web address] for any overnight needs.  05/18/2016, 7:39 AM

## 2016-05-18 NOTE — Progress Notes (Addendum)
ANTICOAGULATION CONSULT NOTE - Initial Consult  Pharmacy Consult for warfarin Indication: atrial fibrillation  Allergies  Allergen Reactions  . Crestor [Rosuvastatin] Other (See Comments)    "ACHING JOINTS"  . Lipitor [Atorvastatin] Other (See Comments)    "ACHING JOINTS"  . Statins Other (See Comments)    "ACHING JOINTS"  . Penicillins Rash and Other (See Comments)    Has patient had a PCN reaction causing immediate rash, facial/tongue/throat swelling, SOB or lightheadedness with hypotension: yes Has patient had a PCN reaction causing severe rash involving mucus membranes or skin necrosis: no Has patient had a PCN reaction that required hospitalization no Has patient had a PCN reaction occurring within the last 10 years: no If all of the above answers are "NO", then may proceed with Cephalosporin use.   . Tramadol Nausea Only    Patient Measurements: Height: 6\' 2"  (188 cm) Weight: 243 lb 6.4 oz (110.4 kg) (scale a) IBW/kg (Calculated) : 82.2   Vital Signs: Temp: 98.1 F (36.7 C) (03/10 0628) Temp Source: Oral (03/10 0628) BP: 117/61 (03/10 0628) Pulse Rate: 74 (03/10 0628)  Labs:  Recent Labs  05/17/16 0036 05/17/16 0626 05/17/16 1131 05/17/16 1556 05/17/16 1748 05/18/16 0023 05/18/16 0448  HGB 10.3* 9.8*  --   --   --   --  9.2*  HCT 33.7* 33.8*  --   --   --   --  31.0*  PLT 226 218  --   --   --   --  233  LABPROT  --  16.0*  --   --   --   --   --   INR  --  1.27  --   --   --   --   --   HEPARINUNFRC  --   --   --  0.61  --  0.33  --   CREATININE 4.14* 3.99*  --   --   --   --  3.56*  TROPONINI 0.04* 0.03* 0.03*  --  0.03*  --   --     Estimated Creatinine Clearance: 19.3 mL/min (by C-G formula based on SCr of 3.56 mg/dL (H)).     Medications:  Scheduled:  . amiodarone  200 mg Oral q morning - 70Y  . folic acid  0.5 mg Oral Daily  . furosemide  80 mg Intravenous BID  . glimepiride  1 mg Oral QAC breakfast  . mouth rinse  15 mL Mouth Rinse BID   . metoprolol succinate  25 mg Oral Daily  . multivitamin with minerals  1 tablet Oral Daily  . omega-3 acid ethyl esters  1 g Oral BID   Infusions:  . heparin 1,500 Units/hr (05/18/16 0010)    Assessment: 83 y/o male with history of afib on warfarin prior to admission. Patient had been holding warfarin prior to an AV fistula placement on 3/6 but was resumed after the surgery.Last dose of warfarin on 3/8 at home. INR was subtherapeutic on admission at 1.27. H/H is slightly down from admission but platelets are within normal limits. Of note, patient has had some bleeding from his IV site.  PTA warfarin dose: 3 mg daily  DDI: Amiodarone as PTA  Goal of Therapy:  INR 2-3 Monitor platelets by anticoagulation protocol: Yes   Plan:  Warfarin 5 mg x 1 tonight Daily INR Monitor for worsening bleed from IV site or other bleeding    Christopher [name], PharmD PGY1 Pharmacy Resident Pager: [phone]  05/18/2016,7:58 AM

## 2016-05-18 NOTE — Progress Notes (Signed)
Patients IV still bleeding, order for IV team.

## 2016-05-19 ENCOUNTER — Inpatient Hospital Stay (HOSPITAL_COMMUNITY): Payer: Medicare Other

## 2016-05-19 LAB — RENAL FUNCTION PANEL
ANION GAP: 9 (ref 5–15)
Albumin: 2.8 g/dL — ABNORMAL LOW (ref 3.5–5.0)
BUN: 94 mg/dL — ABNORMAL HIGH (ref 6–20)
CHLORIDE: 106 mmol/L (ref 101–111)
CO2: 27 mmol/L (ref 22–32)
CREATININE: 3.34 mg/dL — AB (ref 0.61–1.24)
Calcium: 8.5 mg/dL — ABNORMAL LOW (ref 8.9–10.3)
GFR, EST AFRICAN AMERICAN: 18 mL/min — AB (ref >60)
GFR, EST NON AFRICAN AMERICAN: 15 mL/min — AB (ref >60)
Glucose, Bld: 106 mg/dL — ABNORMAL HIGH (ref 65–99)
POTASSIUM: 4.3 mmol/L (ref 3.5–5.1)
Phosphorus: 5.5 mg/dL — ABNORMAL HIGH (ref 2.5–4.6)
Sodium: 142 mmol/L (ref 135–145)

## 2016-05-19 LAB — CBC
HEMATOCRIT: 30 % — AB (ref 39.0–52.0)
Hemoglobin: 8.9 g/dL — ABNORMAL LOW (ref 13.0–17.0)
MCH: 30 pg (ref 26.0–34.0)
MCHC: 29.7 g/dL — ABNORMAL LOW (ref 30.0–36.0)
MCV: 101 fL — AB (ref 78.0–100.0)
Platelets: 207 10*3/uL (ref 150–400)
RBC: 2.97 MIL/uL — AB (ref 4.22–5.81)
RDW: 19.8 % — ABNORMAL HIGH (ref 11.5–15.5)
WBC: 6.4 10*3/uL (ref 4.0–10.5)

## 2016-05-19 LAB — PROTIME-INR
INR: 1.23
PROTHROMBIN TIME: 15.5 s — AB (ref 11.4–15.2)

## 2016-05-19 LAB — PARATHYROID HORMONE, INTACT (NO CA): PTH: 56 pg/mL (ref 15–65)

## 2016-05-19 MED ORDER — DARBEPOETIN ALFA 100 MCG/0.5ML IJ SOSY
100.0000 ug | PREFILLED_SYRINGE | INTRAMUSCULAR | Status: DC
  Filled 2016-05-19: qty 0.5

## 2016-05-19 MED ORDER — WARFARIN SODIUM 5 MG PO TABS
5.0000 mg | ORAL_TABLET | Freq: Once | ORAL | Status: AC
  Administered 2016-05-19: 5 mg via ORAL
  Filled 2016-05-19: qty 1

## 2016-05-19 MED ORDER — DEXTROSE 5 % IV SOLN
120.0000 mg | Freq: Two times a day (BID) | INTRAVENOUS | Status: DC
  Administered 2016-05-19 – 2016-05-20 (×2): 120 mg via INTRAVENOUS
  Filled 2016-05-19 (×2): qty 12

## 2016-05-19 NOTE — Progress Notes (Signed)
ANTICOAGULATION CONSULT NOTE - Organ for warfarin Indication: atrial fibrillation  Allergies  Allergen Reactions  . Crestor [Rosuvastatin] Other (See Comments)    "ACHING JOINTS"  . Lipitor [Atorvastatin] Other (See Comments)    "ACHING JOINTS"  . Statins Other (See Comments)    "ACHING JOINTS"  . Penicillins Rash and Other (See Comments)    Has patient had a PCN reaction causing immediate rash, facial/tongue/throat swelling, SOB or lightheadedness with hypotension: yes Has patient had a PCN reaction causing severe rash involving mucus membranes or skin necrosis: no Has patient had a PCN reaction that required hospitalization no Has patient had a PCN reaction occurring within the last 10 years: no If all of the above answers are "NO", then may proceed with Cephalosporin use.   . Tramadol Nausea Only    Patient Measurements: Height: 6\' 2"  (188 cm) Weight: 239 lb 14.4 oz (108.8 kg) (scale a) IBW/kg (Calculated) : 82.2   Vital Signs: Temp: 98.2 F (36.8 C) (03/11 0638) Temp Source: Oral (03/11 0638) BP: 129/71 (03/11 8250) Pulse Rate: 76 (03/11 0638)  Labs:  Recent Labs  05/17/16 0626 05/17/16 1131 05/17/16 1556 05/17/16 1748 05/18/16 0023 05/18/16 0448 05/19/16 0530  HGB 9.8*  --   --   --   --  9.2* 8.9*  HCT 33.8*  --   --   --   --  31.0* 30.0*  PLT 218  --   --   --   --  233 207  LABPROT 16.0*  --   --   --   --   --  15.5*  INR 1.27  --   --   --   --   --  1.23  HEPARINUNFRC  --   --  0.61  --  0.33  --   --   CREATININE 3.99*  --   --   --   --  3.56* 3.34*  TROPONINI 0.03* 0.03*  --  0.03*  --   --   --     Estimated Creatinine Clearance: 20.5 mL/min (by C-G formula based on SCr of 3.34 mg/dL (H)).     Medications:  Scheduled:  . amiodarone  200 mg Oral q morning - 10a  . [START ON 05/20/2016] darbepoetin (ARANESP) injection - NON-DIALYSIS  100 mcg Subcutaneous Q Mon-1800  . folic acid  0.5 mg Oral Daily  .  furosemide  120 mg Intravenous BID  . glimepiride  1 mg Oral QAC breakfast  . mouth rinse  15 mL Mouth Rinse BID  . metoprolol succinate  25 mg Oral Daily  . multivitamin with minerals  1 tablet Oral Daily  . omega-3 acid ethyl esters  1 g Oral BID  . Warfarin - Pharmacist Dosing Inpatient   Does not apply q1800   Infusions:    Assessment: 83 y/o male with history of afib on warfarin prior to admission. Patient had been holding warfarin prior to an AV fistula placement on 3/6 but was resumed after the surgery.Last dose of warfarin on 3/8 at home. INR today is 1.23 after 1 dose of 5 mg. CBC low but stable.  No overt signs of bleeding noted.   PTA warfarin dose: 3 mg daily  DDI: Amiodarone as PTA  Goal of Therapy:  INR 2-3 Monitor platelets by anticoagulation protocol: Yes   Plan:  Repeat Warfarin 5 mg x 1 tonight Daily INR Monitor for signs/symptoms of bleeding.   Ihor Austin, PharmD  PGY1 Pharmacy Resident Pager: [phone]  05/19/2016,9:47 AM

## 2016-05-19 NOTE — Progress Notes (Signed)
Patient ambulated approximately 100 feet in hallway on room air with oxygen saturation 95-95%.  Patient tolerated well with no shortness of breath.

## 2016-05-19 NOTE — Progress Notes (Signed)
S: No new CO.  Doesn't like the food O:BP 129/71 (BP Location: Left Arm)   Pulse 76   Temp 98.2 F (36.8 C) (Oral)   Resp 20   Ht 6\' 2"  (1.88 m)   Wt 108.8 kg (239 lb 14.4 oz) Comment: scale a  SpO2 100%   BMI 30.80 kg/m   Intake/Output Summary (Last 24 hours) at 05/19/16 0824 Last data filed at 05/19/16 3704  Gross per 24 hour  Intake             1435 ml  Output             1625 ml  Net             -190 ml   Weight change: -2.676 kg (-5 lb 14.4 oz) UGQ:BVQXI and alert HWT:UUEKC, irreg Resp: Few faint crackles Abd:+ BS NTND Ext: 0-tr around ankles  Rt forearm AVF NEURO:CNI Ox3 no asterixis   . amiodarone  200 mg Oral q morning - 00L  . folic acid  0.5 mg Oral Daily  . furosemide  80 mg Intravenous BID  . glimepiride  1 mg Oral QAC breakfast  . mouth rinse  15 mL Mouth Rinse BID  . metoprolol succinate  25 mg Oral Daily  . multivitamin with minerals  1 tablet Oral Daily  . omega-3 acid ethyl esters  1 g Oral BID  . Warfarin - Pharmacist Dosing Inpatient   Does not apply q1800   No results found. BMET    Component Value Date/Time   NA 142 05/19/2016 0530   K 4.3 05/19/2016 0530   CL 106 05/19/2016 0530   CO2 27 05/19/2016 0530   GLUCOSE 106 (H) 05/19/2016 0530   BUN 94 (H) 05/19/2016 0530   CREATININE 3.34 (H) 05/19/2016 0530   CALCIUM 8.5 (L) 05/19/2016 0530   GFRNONAA 15 (L) 05/19/2016 0530   GFRAA 18 (L) 05/19/2016 0530   CBC    Component Value Date/Time   WBC 6.4 05/19/2016 0530   RBC 2.97 (L) 05/19/2016 0530   HGB 8.9 (L) 05/19/2016 0530   HCT 30.0 (L) 05/19/2016 0530   HCT 29.4 (L) 02/27/2015 1644   PLT 207 05/19/2016 0530   MCV 101.0 (H) 05/19/2016 0530   MCH 30.0 05/19/2016 0530   MCHC 29.7 (L) 05/19/2016 0530   RDW 19.8 (H) 05/19/2016 0530   LYMPHSABS 0.8 05/17/2016 0626   MONOABS 0.6 05/17/2016 0626   EOSABS 0.0 05/17/2016 0626   BASOSABS 0.0 05/17/2016 0626     Assessment: 1. CKD 4 sec DM/HTN/AGE, Scr trending back to baseline, UO  OK 2. Pulm edema 3. Chronic A fib 4. Anemia on aranesp as oupt, last dose 05/01/16 5. Hx Sec HPTH   PTH level P Plan: 1. Increase IV lasix today 2. Daily Scr 3. Will give dose of aranesp while here   [place] T

## 2016-05-19 NOTE — Progress Notes (Signed)
Progress Note    Christopher [name]  XKG:[id number] DOB: 11-04-1926  DOA: 05/16/2016 PCP: Tivis Ringer, MD    Brief Narrative:   Chief complaint: Dyspnea  Christopher [name] is an 83 y.o. male with ESRD stage V had recent right upper extremity fistula placement 3 days ago, CAD status post CABG, pacemaker placement, chronic atrial fibrillation on coumadin (briefly stopped prior to his AV fistula placement, resumed 2 days ago), who was admitted 05/16/16 with a chief complaint of dyspnea.    Assessment/Plan:   Principal Problem:   Acute pulmonary edema (HCC) in the setting of ESRD and chronic diastolc CHF, NYHA class 2 Currently on Lasix 80 mg IV Q 12 hours.  Last EF measured was in 2015 showed EF of 55-60%. Chest x-ray personally reviewed and consistent with significant pulmonary edema. With pulmonary edema on CXR, do not think he has pulmonary embolism.  He is not hypoxic or tachycardic.  Has been on coumadin up until his surgery, and is active otherwise. Will hold off on VQ scan despite mildly elevated D-dimer.I/O- approximately 1 L. Creatinine has improved. Repeat CXR continues to show central vascular prominence but patient symptomatically improving and lung exam improving. Lasix increase to 120 mg Q 12 hours.     Active Problems:   Hyperkalemia Potassium remains WNL.    CAD (coronary artery disease)/s/p CABG/RBBB Mildly elevated troponin, but trend flat. Likely reflecting decreased renal clearance and pulmonary edema. No reports of chest pain.    Atrial fibrillation ([place]) Rate controlled: Amiodarone and metoprolol.  Coumadin resumed 05/18/16. INR subtherapeutic.    Anemia of chronic disease Secondary to renal disease. On Aranesp per nephrology.  Diabetes mellitus type 2  Continue Amaryl.  Obesity (BMI 30-39.9) Body mass index is 30.8 kg/m.   Family Communication/Anticipated D/C date and plan/Code Status   DVT prophylaxis: Coumadin ordered. Code Status: Full Code.  Family  Communication: Wife updated at the bedside. Disposition Plan: Home 05/20/16 if cleared by nephrology.   Medical Consultants:    Nephrology   Procedures:    None  Anti-Infectives:    None  Subjective:   Patient thinks he is breathing better. No nausea or vomiting. No chest pain. Wife is at the bedside today.  Objective:    Vitals:   05/18/16 1218 05/18/16 1723 05/18/16 1957 05/19/16 0638  BP: 122/67 130/72 116/63 129/71  Pulse: 72 71 70 76  Resp: 20  20 20   Temp: 98 F (36.7 C)  98 F (36.7 C) 98.2 F (36.8 C)  TempSrc: Oral  Oral Oral  SpO2: 96%  100% 100%  Weight:    108.8 kg (239 lb 14.4 oz)  Height:        Intake/Output Summary (Last 24 hours) at 05/19/16 0731 Last data filed at 05/19/16 0728  Gross per 24 hour  Intake             1435 ml  Output             1375 ml  Net               60 ml   Filed Weights   05/17/16 1402 05/18/16 0628 05/19/16 7026  Weight: 111.5 kg (245 lb 12.8 oz) 110.4 kg (243 lb 6.4 oz) 108.8 kg (239 lb 14.4 oz)    Exam: General exam: Appears calm and comfortable.  Respiratory system: Rare ibasilar crackles. Respiratory effort normal. Cardiovascular system: HSIR. No rubs, gallops or clicks. No murmurs. Gastrointestinal system: Abdomen is  nondistended, soft and nontender. No organomegaly or masses felt. Normal bowel sounds heard. Central nervous system: Alert and oriented. No focal neurological deficits. Extremities: No clubbing,  or cyanosis. 1+ edema BLE. Fistula right forearm + thrill. Skin: No rashes, lesions or ulcers. Psychiatry: Judgement and insight appear normal. Mood & affect appropriate.   Data Reviewed:   I have personally reviewed following labs and imaging studies:  Labs: Basic Metabolic Panel:  Recent Labs Lab 05/14/16 0634 05/17/16 0036 05/17/16 0626 05/18/16 0448 05/19/16 0530  NA 141 139 138 139 142  K 4.8 5.4* 5.0 4.6 4.3  CL  --  105 106 104 106  CO2  --  21* 23 22 27   GLUCOSE 124* 141* 79 91  106*  BUN  --  95* 94* 97* 94*  CREATININE  --  4.14* 3.99* 3.56* 3.34*  CALCIUM  --  8.9 8.8* 8.5* 8.5*  PHOS  --   --   --  5.6* 5.5*   GFR Estimated Creatinine Clearance: 20.5 mL/min (by C-G formula based on SCr of 3.34 mg/dL (H)). Liver Function Tests:  Recent Labs Lab 05/17/16 0626 05/18/16 0448 05/19/16 0530  AST 38  --   --   ALT 44  --   --   ALKPHOS 45  --   --   BILITOT 1.0  --   --   PROT 6.1*  --   --   ALBUMIN 2.9* 2.9* 2.8*   No results for input(s): LIPASE, AMYLASE in the last 168 hours. No results for input(s): AMMONIA in the last 168 hours. Coagulation profile  Recent Labs Lab 05/14/16 0631 05/17/16 0626 05/19/16 0530  INR 1.31 1.27 1.23    CBC:  Recent Labs Lab 05/14/16 0634 05/17/16 0036 05/17/16 0626 05/18/16 0448 05/19/16 0530  WBC  --  9.9 9.8 7.5 6.4  NEUTROABS  --   --  8.4*  --   --   HGB 10.9* 10.3* 9.8* 9.2* 8.9*  HCT 32.0* 33.7* 33.8* 31.0* 30.0*  MCV  --  100.9* 100.0 101.0* 101.0*  PLT  --  226 218 233 207   Cardiac Enzymes:  Recent Labs Lab 05/17/16 0036 05/17/16 0626 05/17/16 1131 05/17/16 1748  TROPONINI 0.04* 0.03* 0.03* 0.03*   BNP (last 3 results) No results for input(s): PROBNP in the last 8760 hours. CBG:  Recent Labs Lab 05/14/16 0601 05/14/16 0858 05/18/16 1026  GLUCAP 125* 105* 95   D-Dimer:  Recent Labs  05/17/16 0626  DDIMER 2.32*   Hgb A1c: No results for input(s): HGBA1C in the last 72 hours. Lipid Profile: No results for input(s): CHOL, HDL, LDLCALC, TRIG, CHOLHDL, LDLDIRECT in the last 72 hours. Thyroid function studies:  Recent Labs  05/17/16 0626  TSH 2.290    Microbiology No results found for this or any previous visit (from the past 240 hour(s)).  Radiology: No results found.  Medications:   . amiodarone  200 mg Oral q morning - 65K  . folic acid  0.5 mg Oral Daily  . furosemide  80 mg Intravenous BID  . glimepiride  1 mg Oral QAC breakfast  . mouth rinse  15 mL  Mouth Rinse BID  . metoprolol succinate  25 mg Oral Daily  . multivitamin with minerals  1 tablet Oral Daily  . omega-3 acid ethyl esters  1 g Oral BID  . Warfarin - Pharmacist Dosing Inpatient   Does not apply q1800   Continuous Infusions:   Medical decision making is of high  complexity and this patient is at high risk of deterioration, therefore this is a level 3 visit.  (> 4 problem points, 4 data points, high risk: severe exacerbation of chronic illness)    LOS: 2 days   Christopher [name]  [facility] Pager [phone]. If unable to reach me by pager, please call my cell phone at [phone].  *Please refer to [email or web address], password [email or web address] to get updated schedule on who will round on this patient, as hospitalists switch teams weekly. If 7PM-7AM, please contact night-coverage at [email or web address], password [email or web address] for any overnight needs.  05/19/2016, 7:31 AM

## 2016-05-20 DIAGNOSIS — I5033 Acute on chronic diastolic (congestive) heart failure: Secondary | ICD-10-CM

## 2016-05-20 LAB — RENAL FUNCTION PANEL
ALBUMIN: 3 g/dL — AB (ref 3.5–5.0)
ANION GAP: 12 (ref 5–15)
BUN: 87 mg/dL — ABNORMAL HIGH (ref 6–20)
CO2: 26 mmol/L (ref 22–32)
Calcium: 8.5 mg/dL — ABNORMAL LOW (ref 8.9–10.3)
Chloride: 102 mmol/L (ref 101–111)
Creatinine, Ser: 3.18 mg/dL — ABNORMAL HIGH (ref 0.61–1.24)
GFR calc non Af Amer: 16 mL/min — ABNORMAL LOW (ref >60)
GFR, EST AFRICAN AMERICAN: 19 mL/min — AB (ref >60)
GLUCOSE: 127 mg/dL — AB (ref 65–99)
PHOSPHORUS: 4.3 mg/dL (ref 2.5–4.6)
POTASSIUM: 3.9 mmol/L (ref 3.5–5.1)
Sodium: 140 mmol/L (ref 135–145)

## 2016-05-20 LAB — PROTIME-INR
INR: 1.26
Prothrombin Time: 15.8 seconds — ABNORMAL HIGH (ref 11.4–15.2)

## 2016-05-20 MED ORDER — FUROSEMIDE 80 MG PO TABS: 120.0000 mg | ORAL_TABLET | Freq: Two times a day (BID) | ORAL | Status: DC

## 2016-05-20 MED ORDER — ALUM & MAG HYDROXIDE-SIMETH 200-200-20 MG/5ML PO SUSP
30.0000 mL | ORAL | Status: DC | PRN
  Administered 2016-05-20: 30 mL via ORAL
  Filled 2016-05-20: qty 30

## 2016-05-20 MED ORDER — WARFARIN SODIUM 5 MG PO TABS: 5.0000 mg | ORAL_TABLET | Freq: Once | ORAL | Status: DC

## 2016-05-20 NOTE — Progress Notes (Signed)
HPI:  83 y.o. M with ESRD stage V had recent right upper extremity fistula placement, CAD s/p CABG, pacemaker placement, chronic atrial fibrillation presents to the ER 3/9 because of worsening shortness of breath over the last 3 days. CXR shows pulm edema  AC:  PTA warf for afib. INR 1.26 PTA dose: 3 mg daily ID:    CV:   VSS - amio, toprol, lasix 120mg  BID (Wt + 2lbs, net -1.6L)  Endo:  CBGs wnl on Glimepiride   GI/Nutr:    Neuro:    Renal:  CKD IV >> V 2/2 DM/HTN/Age. SCr 4.14>>3.18 (BL variable ~3.3) . Recent AVF placement.  K: 3.9, CorCa: 9.0, Phos: 4.3, PTH: 56  Heme/Onc:   Aranesp 100 mcg Gerald qMon for anemia of CKD * Outpatient ESA/iron orders: Aranesp 100 mcg  q21days (last 2/21) * Last Tsat 37% and ferritin 505 on 05/01/16  * Last doses of Aranesp given on 2/21 * Hgb 8.9 on 3/11, next dose due 3/12  Pulm:    PTA med issues:  resumed  Plan:   Warfarin pharmacy dosing Continue high dose lasix - monitor UOP, lytes

## 2016-05-20 NOTE — Discharge Summary (Signed)
Physician Discharge Summary  [name] EHM:[id number] DOB: 1926-10-07 DOA: 05/16/2016  PCP: Tivis Ringer, MD  Admit date: 05/16/2016 Discharge date: 05/20/2016  Admitted From: Home Discharge disposition: Home   Recommendations for Outpatient Follow-Up:   1. Recommend that he follow-up with his PCP in one week for recheck of his electrolytes, renal function, and PT/INR.   Discharge Diagnosis:   Principal Problem:    Acute pulmonary edema (HCC) Active Problems:    CAD (coronary artery disease)    Cardiac pacemaker in situ    Atrial fibrillation (HCC)    Chronic kidney disease (CKD), stage IV (severe) (HCC)    Acute on chronic diastolic CHF (congestive heart failure), NYHA class 2 (HCC)    Anemia of chronic disease    Obesity (BMI 30-39.9)  Discharge Condition: Improved.  Diet recommendation: Low sodium, heart healthy.  Carbohydrate-modified.    History of Present Illness:   Christopher [name] is an 83 y.o. male with ESRD stage V had recent right upper extremity fistula placement 3 days ago, CAD status post CABG, pacemaker placement, chronic atrial fibrillation on coumadin (briefly stopped prior to his AV fistula placement, resumed 2 days ago), who was admitted 05/16/16 with a chief complaint of dyspnea.  Hospital Course by Problem:   Principal Problem:   Acute pulmonary edema (HCC) in the setting of ESRD and chronic diastolc CHF, NYHA class 2 Diuresed with IV Lasix.  Last EF measured was in 2015 showed EF of 55-60%. Chest x-ray personally reviewed and consistent with significant pulmonary edema. With pulmonary edema on CXR, do not think he has pulmonary embolism.  He is not hypoxic or tachycardic.  Has been on coumadin up until his surgery, and is active otherwise. Will hold off on VQ scan despite mildly elevated D-dimer.I/O- approximately 1.6 L/24 hous. Creatinine has improved. Repeat CXR continues to show central vascular prominence but patient symptomatically  improving and lung exam improving. Maintaining his oxygen saturations with ambulation. Discussed case with Dr. Posey Pronto who recommended discharging him home on 120 mg of Lasix twice a day through 05/23/16, then reducing his Lasix dose back to his maintenance dose of 80 mg twice a day.    Active Problems:   Hyperkalemia Potassium remains WNL.    CAD (coronary artery disease)/s/p CABG/RBBB Mildly elevated troponin, but trend flat. Likely reflecting decreased renal clearance and pulmonary edema. No reports of chest pain.    Atrial fibrillation ([place]) Rate controlled: Amiodarone and metoprolol.  Coumadin resumed 05/18/16. INR subtherapeutic. Recommend close outpatient follow-up.    Anemia of chronic disease Secondary to renal disease. On Aranesp per nephrology.  Diabetes mellitus type 2  Continue Amaryl.  Obesity (BMI 30-39.9) Body mass index is 30.47 kg/m.   Medical Consultants:    Nephrology   Discharge Exam:   Vitals:   05/19/16 2009 05/20/16 0440  BP: 119/69 (!) 105/46  Pulse: 84 73  Resp: 20 20  Temp: 98.3 F (36.8 C) 98.4 F (36.9 C)   Vitals:   05/19/16 0638 05/19/16 1218 05/19/16 2009 05/20/16 0440  BP: 129/71 119/64 119/69 (!) 105/46  Pulse: 76 67 84 73  Resp: 20 20 20 20   Temp: 98.2 F (36.8 C) 98.1 F (36.7 C) 98.3 F (36.8 C) 98.4 F (36.9 C)  TempSrc: Oral Oral Oral Oral  SpO2: 100% 96% 96% 100%  Weight: 108.8 kg (239 lb 14.4 oz)   107.6 kg (237 lb 4.8 oz)  Height:        General exam: Appears calm  and comfortable.  Respiratory system: Clear to auscultation. Respiratory effort normal. Cardiovascular system: S1 & S2 heard, RRR. No JVD,  rubs, gallops or clicks. No murmurs. Gastrointestinal system: Abdomen is nondistended, soft and nontender. No organomegaly or masses felt. Normal bowel sounds heard. Central nervous system: Alert and oriented. No focal neurological deficits. Extremities: No clubbing,  or cyanosis. 1+ edema BLE. Fistula right  forearm + thrill Skin: No rashes, lesions or ulcers. Psychiatry: Judgement and insight appear normal. Mood & affect appropriate.    The results of significant diagnostics from this hospitalization (including imaging, microbiology, ancillary and laboratory) are listed below for reference.     Procedures and Diagnostic Studies:   Dg Chest 2 View  Result Date: 05/17/2016 CLINICAL DATA:  Dyspnea for several days. EXAM: CHEST  2 VIEW COMPARISON:  07/20/2015 FINDINGS: Stable right hemidiaphragm elevation. Unchanged cardiomegaly. Mild interstitial prominence without confluent airspace consolidation. No effusions. The transvenous cardiac leads appear grossly intact. There is prior sternotomy and CABG. IMPRESSION: Cardiomegaly. Mild interstitial prominence could represent a degree of interstitial edema or interstitial fluid but there is no airspace consolidation or pleural effusion. Electronically Signed   By: Christopher [name] M.D.   On: 05/17/2016 02:14     Labs:   Basic Metabolic Panel:  Recent Labs Lab 05/17/16 0036 05/17/16 0626 05/18/16 0448 05/19/16 0530 05/20/16 0446  NA 139 138 139 142 140  K 5.4* 5.0 4.6 4.3 3.9  CL 105 106 104 106 102  CO2 21* 23 22 27 26   GLUCOSE 141* 79 91 106* 127*  BUN 95* 94* 97* 94* 87*  CREATININE 4.14* 3.99* 3.56* 3.34* 3.18*  CALCIUM 8.9 8.8* 8.5* 8.5* 8.5*  PHOS  --   --  5.6* 5.5* 4.3   GFR Estimated Creatinine Clearance: 21.4 mL/min (by C-G formula based on SCr of 3.18 mg/dL (H)). Liver Function Tests:  Recent Labs Lab 05/17/16 0626 05/18/16 0448 05/19/16 0530 05/20/16 0446  AST 38  --   --   --   ALT 44  --   --   --   ALKPHOS 45  --   --   --   BILITOT 1.0  --   --   --   PROT 6.1*  --   --   --   ALBUMIN 2.9* 2.9* 2.8* 3.0*   No results for input(s): LIPASE, AMYLASE in the last 168 hours. No results for input(s): AMMONIA in the last 168 hours. Coagulation profile  Recent Labs Lab 05/14/16 0631 05/17/16 0626 05/19/16 0530  05/20/16 0446  INR 1.31 1.27 1.23 1.26    CBC:  Recent Labs Lab 05/14/16 0634 05/17/16 0036 05/17/16 0626 05/18/16 0448 05/19/16 0530  WBC  --  9.9 9.8 7.5 6.4  NEUTROABS  --   --  8.4*  --   --   HGB 10.9* 10.3* 9.8* 9.2* 8.9*  HCT 32.0* 33.7* 33.8* 31.0* 30.0*  MCV  --  100.9* 100.0 101.0* 101.0*  PLT  --  226 218 233 207   Cardiac Enzymes:  Recent Labs Lab 05/17/16 0036 05/17/16 0626 05/17/16 1131 05/17/16 1748  TROPONINI 0.04* 0.03* 0.03* 0.03*   BNP: Invalid input(s): POCBNP CBG:  Recent Labs Lab 05/14/16 0601 05/14/16 0858 05/18/16 1026  GLUCAP 125* 105* 95     Discharge Instructions:   Discharge Instructions    (HEART FAILURE PATIENTS) Call MD:  Anytime you have any of the following symptoms: 1) 3 pound weight gain in 24 hours or 5 pounds in 1 week  2) shortness of breath, with or without a dry hacking cough 3) swelling in the hands, feet or stomach 4) if you have to sleep on extra pillows at night in order to breathe.    Complete by:  As directed    Call MD for:  difficulty breathing, headache or visual disturbances    Complete by:  As directed    Call MD for:  extreme fatigue    Complete by:  As directed    Call MD for:  persistant dizziness or light-headedness    Complete by:  As directed    Diet - low sodium heart healthy    Complete by:  As directed    Diet Carb Modified    Complete by:  As directed    Increase activity slowly    Complete by:  As directed      Allergies as of 05/20/2016      Reactions   Crestor [rosuvastatin] Other (See Comments)   "ACHING JOINTS"   Lipitor [atorvastatin] Other (See Comments)   "ACHING JOINTS"   Statins Other (See Comments)   "ACHING JOINTS"   Penicillins Rash, Other (See Comments)   Has patient had a PCN reaction causing immediate rash, facial/tongue/throat swelling, SOB or lightheadedness with hypotension: yes Has patient had a PCN reaction causing severe rash involving mucus membranes or skin  necrosis: no Has patient had a PCN reaction that required hospitalization no Has patient had a PCN reaction occurring within the last 10 years: no If all of the above answers are "NO", then may proceed with Cephalosporin use.   Tramadol Nausea Only      Medication List    TAKE these medications   amiodarone 200 MG tablet Commonly known as:  PACERONE Take 1 tablet (200 mg total) by mouth every morning.   carboxymethylcellulose 0.5 % Soln Commonly known as:  REFRESH PLUS Place 1 drop into both eyes 3 (three) times daily as needed (for dry eyes.).   Darbepoetin Alfa 100 MCG/0.5ML Sosy injection Commonly known as:  ARANESP Inject 100 mcg into the skin every 21 ( twenty-one) days.   Fish Oil 1200 MG Caps Take 1,200 mg by mouth 2 (two) times daily.   folic acid 245 MCG tablet Commonly known as:  FOLVITE Take 400 mcg by mouth daily.   furosemide 80 MG tablet Commonly known as:  LASIX Take 1.5 tablets (120 mg total) by mouth 2 (two) times daily. Take 120 mg twice a day through Thursday, then reduce dose to 80 mg twice a day. What changed:  how much to take  additional instructions   glimepiride 1 MG tablet Commonly known as:  AMARYL Take 1 tablet (1 mg total) by mouth daily before breakfast.   Glucosamine HCl 1500 MG Tabs Take 1,500 mg by mouth 2 (two) times daily.   [place] 300 MG Caps Take 300 mg by mouth daily.   HYDROcodone-acetaminophen 5-325 MG tablet Commonly known as:  NORCO/VICODIN Take 1 tablet by mouth every 6 (six) hours as needed. For pain.   metoprolol succinate 25 MG 24 hr tablet Commonly known as:  TOPROL-XL Take 25 mg by mouth daily.   MIRALAX PO Take 17 g by mouth daily.   multivitamin with minerals Tabs tablet Take 1 tablet by mouth daily.   nitroGLYCERIN 0.4 MG SL tablet Commonly known as:  NITROSTAT Place 0.4 mg under the tongue every 5 (five) minutes as needed for chest pain.   warfarin 3 MG tablet Commonly known as:  COUMADIN Take 1 tablet (3 mg total) by mouth daily.      Follow-up Information    MATTINGLY,MICHAEL T, MD Follow up on 07/01/2016.   Specialty:  Nephrology Why:  At your scheduled appointment. Contact information: [place] [address] [phone]        Tivis Ringer, MD. Schedule an appointment as soon as possible for a visit in 1 week.   Specialty:  Internal Medicine Why:  Hospital follow up, lab work.Left message for office to call patient Contact information: [place] [address] [phone]            Time coordinating discharge: 35 minutes.  Signed:  RAMA,CHRISTINA  Pager [phone] [facility] 05/20/2016, 1:16 PM

## 2016-05-20 NOTE — Progress Notes (Addendum)
ANTICOAGULATION CONSULT NOTE - [place] for warfarin Indication: atrial fibrillation  Allergies  Allergen Reactions  . Crestor [Rosuvastatin] Other (See Comments)    "ACHING JOINTS"  . Lipitor [Atorvastatin] Other (See Comments)    "ACHING JOINTS"  . Statins Other (See Comments)    "ACHING JOINTS"  . Penicillins Rash and Other (See Comments)    Has patient had a PCN reaction causing immediate rash, facial/tongue/throat swelling, SOB or lightheadedness with hypotension: yes Has patient had a PCN reaction causing severe rash involving mucus membranes or skin necrosis: no Has patient had a PCN reaction that required hospitalization no Has patient had a PCN reaction occurring within the last 10 years: no If all of the above answers are "NO", then may proceed with Cephalosporin use.   . Tramadol Nausea Only    Patient Measurements: Height: 6\' 2"  (188 cm) Weight: 237 lb 4.8 oz (107.6 kg) (scale a) IBW/kg (Calculated) : 82.2   Vital Signs: Temp: 98.4 F (36.9 C) (03/12 0440) Temp Source: Oral (03/12 0440) BP: 105/46 (03/12 0440) Pulse Rate: 73 (03/12 0440)  Labs:  Recent Labs  05/17/16 1131 05/17/16 1556 05/17/16 1748 05/18/16 0023 05/18/16 0448 05/19/16 0530 05/20/16 0446  HGB  --   --   --   --  9.2* 8.9*  --   HCT  --   --   --   --  31.0* 30.0*  --   PLT  --   --   --   --  233 207  --   LABPROT  --   --   --   --   --  15.5* 15.8*  INR  --   --   --   --   --  1.23 1.26  HEPARINUNFRC  --  0.61  --  0.33  --   --   --   CREATININE  --   --   --   --  3.56* 3.34* 3.18*  TROPONINI 0.03*  --  0.03*  --   --   --   --     Estimated Creatinine Clearance: 21.4 mL/min (by C-G formula based on SCr of 3.18 mg/dL (H)).   Assessment: 83 y/o male with history of afib on warfarin prior to admission. Patient had been holding warfarin prior to an AV fistula placement on 3/6, restarted on 3/10. INR 1.26, not increasing much though on higher than  home dos.   PTA warfarin dose: 3 mg daily  DDI: Amiodarone as PTA  Goal of Therapy:  INR 2-3 Monitor platelets by anticoagulation protocol: Yes   Plan:  Repeat Warfarin 5 mg x 1 tonight Daily INR Monitor for signs/symptoms of bleeding. If discharge home, recommend out patient INR follow up Wed or Thurs   Maryanna Shape, PharmD, BCPS  Clinical Pharmacist  Pager: [phone]   05/20/2016,11:35 AM

## 2016-05-20 NOTE — Progress Notes (Signed)
Patient ID: Christopher [name], male   DOB: 1926/04/26, 83 y.o.   MRN: [id number]  [facility] Progress Note   Assessment/ Plan:   1. AKI on chronic kidney disease stage IV: Appears to be hemodynamically mediated and now with downward trend of creatinine noted with successful diuresis/volume unloading. He is status post creation of a right radiocephalic fistula that currently has a good thrill. 2. Pulmonary edema/volume overload: Responded well to intravenous furosemide, transition to oral furosemide 120 mg daily for the next 4 days and then drop dose back to 80 mg twice a day as he was taking prior to hospitalization. Appears stable enough for discharge home later today. 3. Chronic atrial fibrillation; rate controlled and hemodynamically stable 4. Anemia of chronic kidney disease: Resume ESA as an outpatient per previous orders  Subjective:   Reports to be feeling well with no further shortness of breath-able to ambulate without problems    Objective:   BP (!) 105/46 (BP Location: Left Arm)   Pulse 73   Temp 98.4 F (36.9 C) (Oral)   Resp 20   Ht 6\' 2"  (1.88 m)   Wt 107.6 kg (237 lb 4.8 oz) Comment: scale a  SpO2 100%   BMI 30.47 kg/m   Intake/Output Summary (Last 24 hours) at 05/20/16 1011 Last data filed at 05/20/16 0945  Gross per 24 hour  Intake             1250 ml  Output             2600 ml  Net            -1350 ml   Weight change: -1.179 kg (-2 lb 9.6 oz)  Physical Exam: [id number] resting in bed, family members at bedside CVS: Pulse irregularly irregular, S1 and S2 normal Resp: Fine rales over left base otherwise clear to auscultation Abd: Soft, obese, nontender Ext: 1+ edema bilaterally  Imaging: Dg Chest Port 1 View  Result Date: 05/19/2016 CLINICAL DATA:  History of CHF EXAM: PORTABLE CHEST 1 VIEW COMPARISON:  05/17/2016 FINDINGS: Cardiac shadow remains enlarged. A pacing device is again seen. Bibasilar atelectasis is noted worse on the right than  the left. Mild central vascular prominence is noted without interstitial edema. No bony abnormality is noted. IMPRESSION: Mild bibasilar changes. Electronically Signed   By: Inez Catalina M.D.   On: 05/19/2016 08:35    Labs: BMET  Recent Labs Lab 05/14/16 0175 05/17/16 0036 05/17/16 0626 05/18/16 0448 05/19/16 0530 05/20/16 0446  NA 141 139 138 139 142 140  K 4.8 5.4* 5.0 4.6 4.3 3.9  CL  --  105 106 104 106 102  CO2  --  21* 23 22 27 26   GLUCOSE 124* 141* 79 91 106* 127*  BUN  --  95* 94* 97* 94* 87*  CREATININE  --  4.14* 3.99* 3.56* 3.34* 3.18*  CALCIUM  --  8.9 8.8* 8.5* 8.5* 8.5*  PHOS  --   --   --  5.6* 5.5* 4.3   CBC  Recent Labs Lab 05/17/16 0036 05/17/16 0626 05/18/16 0448 05/19/16 0530  WBC 9.9 9.8 7.5 6.4  NEUTROABS  --  8.4*  --   --   HGB 10.3* 9.8* 9.2* 8.9*  HCT 33.7* 33.8* 31.0* 30.0*  MCV 100.9* 100.0 101.0* 101.0*  PLT 226 218 233 207    Medications:    . amiodarone  200 mg Oral q morning - 10a  . darbepoetin (ARANESP) injection - NON-DIALYSIS  100 mcg Subcutaneous Q Mon-1800  . folic acid  0.5 mg Oral Daily  . furosemide  120 mg Oral BID  . glimepiride  1 mg Oral QAC breakfast  . mouth rinse  15 mL Mouth Rinse BID  . metoprolol succinate  25 mg Oral Daily  . multivitamin with minerals  1 tablet Oral Daily  . omega-3 acid ethyl esters  1 g Oral BID  . Warfarin - Pharmacist Dosing Inpatient   Does not apply [id number]   Elmarie Shiley, MD 05/20/2016, 10:11 AM

## 2016-05-20 NOTE — Progress Notes (Signed)
Pt has orders to be discharged. Discharge instructions given and pt has no additional questions at this time. Medication regimen reviewed and pt educated. Pt verbalized understanding and has no additional questions. Telemetry box removed. IV removed and site in good condition. Pt stable and waiting for transportation.  Samyah Bilbo RN 

## 2016-05-22 ENCOUNTER — Encounter (HOSPITAL_COMMUNITY)
Admission: RE | Admit: 2016-05-22 | Discharge: 2016-05-22 | Disposition: A | Discharge status: Home / Self Care | Payer: Medicare Other | Source: Ambulatory Visit | Attending: Nephrology | Admitting: Nephrology

## 2016-05-22 DIAGNOSIS — D638 Anemia in other chronic diseases classified elsewhere: Secondary | ICD-10-CM | POA: Diagnosis not present

## 2016-05-22 DIAGNOSIS — N184 Chronic kidney disease, stage 4 (severe): Secondary | ICD-10-CM | POA: Insufficient documentation

## 2016-05-22 LAB — POCT HEMOGLOBIN-HEMACUE: HEMOGLOBIN: 10.6 g/dL — AB (ref 13.0–17.0)

## 2016-05-22 MED ORDER — DARBEPOETIN ALFA 100 MCG/0.5ML IJ SOSY
PREFILLED_SYRINGE | INTRAMUSCULAR | Status: AC
Start: 2016-05-22 — End: 2016-05-22
  Administered 2016-05-22: 100 ug via SUBCUTANEOUS
  Filled 2016-05-22: qty 0.5

## 2016-05-22 MED ORDER — DARBEPOETIN ALFA 100 MCG/0.5ML IJ SOSY
100.0000 ug | PREFILLED_SYRINGE | INTRAMUSCULAR | Status: DC
Start: 2016-05-22 — End: 2016-05-23
  Administered 2016-05-22: 100 ug via SUBCUTANEOUS

## 2016-05-27 DIAGNOSIS — Z7901 Long term (current) use of anticoagulants: Secondary | ICD-10-CM | POA: Diagnosis not present

## 2016-05-27 DIAGNOSIS — I480 Paroxysmal atrial fibrillation: Secondary | ICD-10-CM | POA: Diagnosis not present

## 2016-05-27 DIAGNOSIS — J811 Chronic pulmonary edema: Secondary | ICD-10-CM | POA: Diagnosis not present

## 2016-05-27 DIAGNOSIS — E1151 Type 2 diabetes mellitus with diabetic peripheral angiopathy without gangrene: Secondary | ICD-10-CM | POA: Diagnosis not present

## 2016-05-27 DIAGNOSIS — I2510 Atherosclerotic heart disease of native coronary artery without angina pectoris: Secondary | ICD-10-CM | POA: Diagnosis not present

## 2016-05-27 DIAGNOSIS — I10 Essential (primary) hypertension: Secondary | ICD-10-CM | POA: Diagnosis not present

## 2016-05-27 DIAGNOSIS — N184 Chronic kidney disease, stage 4 (severe): Secondary | ICD-10-CM | POA: Diagnosis not present

## 2016-05-28 DIAGNOSIS — Z7901 Long term (current) use of anticoagulants: Secondary | ICD-10-CM | POA: Diagnosis not present

## 2016-05-28 DIAGNOSIS — I480 Paroxysmal atrial fibrillation: Secondary | ICD-10-CM | POA: Diagnosis not present

## 2016-05-28 DIAGNOSIS — E1122 Type 2 diabetes mellitus with diabetic chronic kidney disease: Secondary | ICD-10-CM | POA: Diagnosis not present

## 2016-05-28 DIAGNOSIS — N184 Chronic kidney disease, stage 4 (severe): Secondary | ICD-10-CM | POA: Diagnosis not present

## 2016-05-28 DIAGNOSIS — Z951 Presence of aortocoronary bypass graft: Secondary | ICD-10-CM | POA: Diagnosis not present

## 2016-05-28 DIAGNOSIS — R0600 Dyspnea, unspecified: Secondary | ICD-10-CM | POA: Diagnosis not present

## 2016-05-28 DIAGNOSIS — I119 Hypertensive heart disease without heart failure: Secondary | ICD-10-CM | POA: Diagnosis not present

## 2016-05-28 DIAGNOSIS — R0602 Shortness of breath: Secondary | ICD-10-CM | POA: Diagnosis not present

## 2016-05-28 DIAGNOSIS — E785 Hyperlipidemia, unspecified: Secondary | ICD-10-CM | POA: Diagnosis not present

## 2016-05-28 DIAGNOSIS — I2510 Atherosclerotic heart disease of native coronary artery without angina pectoris: Secondary | ICD-10-CM | POA: Diagnosis not present

## 2016-05-28 DIAGNOSIS — Z950 Presence of cardiac pacemaker: Secondary | ICD-10-CM | POA: Diagnosis not present

## 2016-05-28 DIAGNOSIS — I5032 Chronic diastolic (congestive) heart failure: Secondary | ICD-10-CM | POA: Diagnosis not present

## 2016-05-31 DIAGNOSIS — I5032 Chronic diastolic (congestive) heart failure: Secondary | ICD-10-CM | POA: Diagnosis not present

## 2016-06-04 DIAGNOSIS — I5032 Chronic diastolic (congestive) heart failure: Secondary | ICD-10-CM | POA: Diagnosis not present

## 2016-06-04 DIAGNOSIS — E785 Hyperlipidemia, unspecified: Secondary | ICD-10-CM | POA: Diagnosis not present

## 2016-06-04 DIAGNOSIS — Z7901 Long term (current) use of anticoagulants: Secondary | ICD-10-CM | POA: Diagnosis not present

## 2016-06-04 DIAGNOSIS — I480 Paroxysmal atrial fibrillation: Secondary | ICD-10-CM | POA: Diagnosis not present

## 2016-06-04 DIAGNOSIS — R0602 Shortness of breath: Secondary | ICD-10-CM | POA: Diagnosis not present

## 2016-06-04 DIAGNOSIS — E1122 Type 2 diabetes mellitus with diabetic chronic kidney disease: Secondary | ICD-10-CM | POA: Diagnosis not present

## 2016-06-04 DIAGNOSIS — N184 Chronic kidney disease, stage 4 (severe): Secondary | ICD-10-CM | POA: Diagnosis not present

## 2016-06-04 DIAGNOSIS — R0600 Dyspnea, unspecified: Secondary | ICD-10-CM | POA: Diagnosis not present

## 2016-06-04 DIAGNOSIS — Z951 Presence of aortocoronary bypass graft: Secondary | ICD-10-CM | POA: Diagnosis not present

## 2016-06-04 DIAGNOSIS — Z950 Presence of cardiac pacemaker: Secondary | ICD-10-CM | POA: Diagnosis not present

## 2016-06-04 DIAGNOSIS — I2510 Atherosclerotic heart disease of native coronary artery without angina pectoris: Secondary | ICD-10-CM | POA: Diagnosis not present

## 2016-06-04 DIAGNOSIS — I119 Hypertensive heart disease without heart failure: Secondary | ICD-10-CM | POA: Diagnosis not present

## 2016-06-10 ENCOUNTER — Emergency Department (HOSPITAL_COMMUNITY): Payer: Medicare Other

## 2016-06-10 ENCOUNTER — Inpatient Hospital Stay (HOSPITAL_COMMUNITY)
Admission: EM | Admit: 2016-06-10 | Discharge: 2016-06-13 | DRG: 308 | Disposition: A | Discharge status: Home Health | Payer: Medicare Other | Attending: Internal Medicine | Admitting: Internal Medicine

## 2016-06-10 ENCOUNTER — Encounter (HOSPITAL_COMMUNITY): Payer: Self-pay | Admitting: Emergency Medicine

## 2016-06-10 DIAGNOSIS — R911 Solitary pulmonary nodule: Secondary | ICD-10-CM | POA: Diagnosis present

## 2016-06-10 DIAGNOSIS — I509 Heart failure, unspecified: Secondary | ICD-10-CM

## 2016-06-10 DIAGNOSIS — R0602 Shortness of breath: Secondary | ICD-10-CM | POA: Diagnosis not present

## 2016-06-10 DIAGNOSIS — Z885 Allergy status to narcotic agent status: Secondary | ICD-10-CM

## 2016-06-10 DIAGNOSIS — Z951 Presence of aortocoronary bypass graft: Secondary | ICD-10-CM

## 2016-06-10 DIAGNOSIS — Z85828 Personal history of other malignant neoplasm of skin: Secondary | ICD-10-CM

## 2016-06-10 DIAGNOSIS — I2510 Atherosclerotic heart disease of native coronary artery without angina pectoris: Secondary | ICD-10-CM | POA: Diagnosis not present

## 2016-06-10 DIAGNOSIS — I132 Hypertensive heart and chronic kidney disease with heart failure and with stage 5 chronic kidney disease, or end stage renal disease: Secondary | ICD-10-CM | POA: Diagnosis present

## 2016-06-10 DIAGNOSIS — I10 Essential (primary) hypertension: Secondary | ICD-10-CM | POA: Diagnosis not present

## 2016-06-10 DIAGNOSIS — Z8546 Personal history of malignant neoplasm of prostate: Secondary | ICD-10-CM

## 2016-06-10 DIAGNOSIS — Z79899 Other long term (current) drug therapy: Secondary | ICD-10-CM

## 2016-06-10 DIAGNOSIS — R0609 Other forms of dyspnea: Secondary | ICD-10-CM

## 2016-06-10 DIAGNOSIS — R0601 Orthopnea: Secondary | ICD-10-CM

## 2016-06-10 DIAGNOSIS — Z6829 Body mass index (BMI) 29.0-29.9, adult: Secondary | ICD-10-CM

## 2016-06-10 DIAGNOSIS — E669 Obesity, unspecified: Secondary | ICD-10-CM | POA: Diagnosis not present

## 2016-06-10 DIAGNOSIS — N185 Chronic kidney disease, stage 5: Secondary | ICD-10-CM | POA: Diagnosis present

## 2016-06-10 DIAGNOSIS — I480 Paroxysmal atrial fibrillation: Secondary | ICD-10-CM | POA: Diagnosis not present

## 2016-06-10 DIAGNOSIS — D638 Anemia in other chronic diseases classified elsewhere: Secondary | ICD-10-CM | POA: Diagnosis present

## 2016-06-10 DIAGNOSIS — E784 Other hyperlipidemia: Secondary | ICD-10-CM

## 2016-06-10 DIAGNOSIS — E1122 Type 2 diabetes mellitus with diabetic chronic kidney disease: Secondary | ICD-10-CM | POA: Diagnosis present

## 2016-06-10 DIAGNOSIS — E1121 Type 2 diabetes mellitus with diabetic nephropathy: Secondary | ICD-10-CM

## 2016-06-10 DIAGNOSIS — Z794 Long term (current) use of insulin: Secondary | ICD-10-CM | POA: Diagnosis not present

## 2016-06-10 DIAGNOSIS — R0600 Dyspnea, unspecified: Secondary | ICD-10-CM | POA: Diagnosis not present

## 2016-06-10 DIAGNOSIS — Z7984 Long term (current) use of oral hypoglycemic drugs: Secondary | ICD-10-CM

## 2016-06-10 DIAGNOSIS — I5033 Acute on chronic diastolic (congestive) heart failure: Secondary | ICD-10-CM

## 2016-06-10 DIAGNOSIS — Z950 Presence of cardiac pacemaker: Secondary | ICD-10-CM

## 2016-06-10 DIAGNOSIS — R069 Unspecified abnormalities of breathing: Secondary | ICD-10-CM | POA: Diagnosis not present

## 2016-06-10 DIAGNOSIS — Z7901 Long term (current) use of anticoagulants: Secondary | ICD-10-CM

## 2016-06-10 DIAGNOSIS — E1129 Type 2 diabetes mellitus with other diabetic kidney complication: Secondary | ICD-10-CM | POA: Diagnosis present

## 2016-06-10 DIAGNOSIS — N400 Enlarged prostate without lower urinary tract symptoms: Secondary | ICD-10-CM | POA: Diagnosis present

## 2016-06-10 DIAGNOSIS — Z880 Allergy status to penicillin: Secondary | ICD-10-CM

## 2016-06-10 DIAGNOSIS — Z833 Family history of diabetes mellitus: Secondary | ICD-10-CM

## 2016-06-10 DIAGNOSIS — N186 End stage renal disease: Secondary | ICD-10-CM | POA: Diagnosis present

## 2016-06-10 DIAGNOSIS — E785 Hyperlipidemia, unspecified: Secondary | ICD-10-CM | POA: Diagnosis present

## 2016-06-10 DIAGNOSIS — I5032 Chronic diastolic (congestive) heart failure: Secondary | ICD-10-CM

## 2016-06-10 DIAGNOSIS — Z888 Allergy status to other drugs, medicaments and biological substances status: Secondary | ICD-10-CM

## 2016-06-10 HISTORY — DX: Chronic diastolic (congestive) heart failure: I50.32

## 2016-06-10 HISTORY — DX: Chronic kidney disease, stage 4 (severe): N18.4

## 2016-06-10 LAB — CBC WITH DIFFERENTIAL/PLATELET
Basophils Absolute: 0 10*3/uL (ref 0.0–0.1)
Basophils Relative: 0 %
EOS PCT: 0 %
Eosinophils Absolute: 0 10*3/uL (ref 0.0–0.7)
HCT: 33.7 % — ABNORMAL LOW (ref 39.0–52.0)
Hemoglobin: 10.1 g/dL — ABNORMAL LOW (ref 13.0–17.0)
LYMPHS ABS: 0.9 10*3/uL (ref 0.7–4.0)
LYMPHS PCT: 11 %
MCH: 30.6 pg (ref 26.0–34.0)
MCHC: 30 g/dL (ref 30.0–36.0)
MCV: 102.1 fL — AB (ref 78.0–100.0)
MONO ABS: 0.4 10*3/uL (ref 0.1–1.0)
MONOS PCT: 5 %
Neutro Abs: 6.4 10*3/uL (ref 1.7–7.7)
Neutrophils Relative %: 84 %
PLATELETS: 236 10*3/uL (ref 150–400)
RBC: 3.3 MIL/uL — AB (ref 4.22–5.81)
RDW: 20.7 % — AB (ref 11.5–15.5)
WBC: 7.7 10*3/uL (ref 4.0–10.5)

## 2016-06-10 LAB — COMPREHENSIVE METABOLIC PANEL
ALT: 23 U/L (ref 17–63)
ANION GAP: 12 (ref 5–15)
AST: 31 U/L (ref 15–41)
Albumin: 3.5 g/dL (ref 3.5–5.0)
Alkaline Phosphatase: 53 U/L (ref 38–126)
BUN: 77 mg/dL — ABNORMAL HIGH (ref 6–20)
CALCIUM: 8.8 mg/dL — AB (ref 8.9–10.3)
CHLORIDE: 103 mmol/L (ref 101–111)
CO2: 25 mmol/L (ref 22–32)
CREATININE: 3.24 mg/dL — AB (ref 0.61–1.24)
GFR, EST AFRICAN AMERICAN: 18 mL/min — AB (ref >60)
GFR, EST NON AFRICAN AMERICAN: 16 mL/min — AB (ref >60)
Glucose, Bld: 95 mg/dL (ref 65–99)
Potassium: 4.4 mmol/L (ref 3.5–5.1)
Sodium: 140 mmol/L (ref 135–145)
Total Bilirubin: 1.2 mg/dL (ref 0.3–1.2)
Total Protein: 6.6 g/dL (ref 6.5–8.1)

## 2016-06-10 LAB — PROTIME-INR
INR: 2.5
PROTHROMBIN TIME: 27.5 s — AB (ref 11.4–15.2)

## 2016-06-10 LAB — GLUCOSE, CAPILLARY
GLUCOSE-CAPILLARY: 117 mg/dL — AB (ref 65–99)
Glucose-Capillary: 135 mg/dL — ABNORMAL HIGH (ref 65–99)
Glucose-Capillary: 136 mg/dL — ABNORMAL HIGH (ref 65–99)

## 2016-06-10 LAB — BASIC METABOLIC PANEL
Anion gap: 15 (ref 5–15)
BUN: 76 mg/dL — ABNORMAL HIGH (ref 6–20)
CALCIUM: 8.6 mg/dL — AB (ref 8.9–10.3)
CO2: 19 mmol/L — AB (ref 22–32)
Chloride: 107 mmol/L (ref 101–111)
Creatinine, Ser: 3.04 mg/dL — ABNORMAL HIGH (ref 0.61–1.24)
GFR, EST AFRICAN AMERICAN: 20 mL/min — AB (ref >60)
GFR, EST NON AFRICAN AMERICAN: 17 mL/min — AB (ref >60)
GLUCOSE: 99 mg/dL (ref 65–99)
POTASSIUM: 5.2 mmol/L — AB (ref 3.5–5.1)
Sodium: 141 mmol/L (ref 135–145)

## 2016-06-10 LAB — TROPONIN I
Troponin I: <0.03 ng/mL (ref <0.03)
Troponin I: <0.03 ng/mL (ref <0.03)
Troponin I: <0.03 ng/mL (ref <0.03)

## 2016-06-10 LAB — BRAIN NATRIURETIC PEPTIDE: B Natriuretic Peptide: 920.1 pg/mL — ABNORMAL HIGH (ref 0.0–100.0)

## 2016-06-10 LAB — I-STAT TROPONIN, ED: Troponin i, poc: 0.01 ng/mL (ref 0.00–0.08)

## 2016-06-10 MED ORDER — NITROGLYCERIN 0.4 MG SL SUBL: 0.4000 mg | SUBLINGUAL_TABLET | SUBLINGUAL | Status: DC | PRN

## 2016-06-10 MED ORDER — ONDANSETRON HCL 4 MG PO TABS: 4.0000 mg | ORAL_TABLET | Freq: Four times a day (QID) | ORAL | Status: DC | PRN

## 2016-06-10 MED ORDER — FUROSEMIDE 10 MG/ML IJ SOLN
60.0000 mg | Freq: Once | INTRAMUSCULAR | Status: AC
  Administered 2016-06-10: 60 mg via INTRAVENOUS
  Filled 2016-06-10: qty 6

## 2016-06-10 MED ORDER — ACETAMINOPHEN 650 MG RE SUPP: 650.0000 mg | Freq: Four times a day (QID) | RECTAL | Status: DC | PRN

## 2016-06-10 MED ORDER — AMIODARONE HCL 200 MG PO TABS
200.0000 mg | ORAL_TABLET | Freq: Every morning | ORAL | Status: DC
  Administered 2016-06-10: 200 mg via ORAL
  Filled 2016-06-10: qty 1

## 2016-06-10 MED ORDER — METOPROLOL SUCCINATE ER 25 MG PO TB24
25.0000 mg | ORAL_TABLET | Freq: Every day | ORAL | Status: DC
  Administered 2016-06-10 – 2016-06-13 (×4): 25 mg via ORAL
  Filled 2016-06-10 (×4): qty 1

## 2016-06-10 MED ORDER — SODIUM CHLORIDE 0.9% FLUSH: 3.0000 mL | Freq: Two times a day (BID) | INTRAVENOUS | Status: DC

## 2016-06-10 MED ORDER — FUROSEMIDE 10 MG/ML IJ SOLN
120.0000 mg | Freq: Once | INTRAMUSCULAR | Status: AC
  Administered 2016-06-10: 120 mg via INTRAVENOUS
  Filled 2016-06-10: qty 12

## 2016-06-10 MED ORDER — TRAZODONE HCL 50 MG PO TABS: 25.0000 mg | ORAL_TABLET | Freq: Every evening | ORAL | Status: DC | PRN

## 2016-06-10 MED ORDER — WARFARIN SODIUM 4 MG PO TABS
4.0000 mg | ORAL_TABLET | Freq: Every day | ORAL | Status: DC
  Administered 2016-06-10 – 2016-06-12 (×3): 4 mg via ORAL
  Filled 2016-06-10 (×3): qty 1

## 2016-06-10 MED ORDER — WARFARIN - PHARMACIST DOSING INPATIENT
Freq: Every day | Status: DC
  Administered 2016-06-10: 18:00:00

## 2016-06-10 MED ORDER — POLYETHYLENE GLYCOL 3350 17 G PO PACK
17.0000 g | PACK | Freq: Every day | ORAL | Status: DC
Start: 2016-06-10 — End: 2016-06-13
  Administered 2016-06-10 – 2016-06-13 (×4): 17 g via ORAL
  Filled 2016-06-10 (×4): qty 1

## 2016-06-10 MED ORDER — ONDANSETRON HCL 4 MG/2ML IJ SOLN: 4.0000 mg | Freq: Four times a day (QID) | INTRAMUSCULAR | Status: DC | PRN

## 2016-06-10 MED ORDER — AMIODARONE HCL 200 MG PO TABS
200.0000 mg | ORAL_TABLET | Freq: Two times a day (BID) | ORAL | Status: DC
  Administered 2016-06-10 – 2016-06-13 (×6): 200 mg via ORAL
  Filled 2016-06-10 (×6): qty 1

## 2016-06-10 MED ORDER — FUROSEMIDE 10 MG/ML IJ SOLN: 120.0000 mg | Freq: Two times a day (BID) | INTRAVENOUS | Status: DC

## 2016-06-10 MED ORDER — BENZONATATE 100 MG PO CAPS
200.0000 mg | ORAL_CAPSULE | Freq: Three times a day (TID) | ORAL | Status: DC | PRN
  Administered 2016-06-10 – 2016-06-11 (×2): 200 mg via ORAL
  Filled 2016-06-10 (×2): qty 2

## 2016-06-10 MED ORDER — HYDROCODONE-ACETAMINOPHEN 5-325 MG PO TABS: 1.0000 | ORAL_TABLET | Freq: Four times a day (QID) | ORAL | Status: DC | PRN

## 2016-06-10 MED ORDER — OMEGA-3-ACID ETHYL ESTERS 1 G PO CAPS
1.0000 g | ORAL_CAPSULE | Freq: Two times a day (BID) | ORAL | Status: DC
  Administered 2016-06-10 – 2016-06-13 (×7): 1 g via ORAL
  Filled 2016-06-10 (×7): qty 1

## 2016-06-10 MED ORDER — ACETAMINOPHEN 325 MG PO TABS: 650.0000 mg | ORAL_TABLET | Freq: Four times a day (QID) | ORAL | Status: DC | PRN

## 2016-06-10 MED ORDER — INSULIN ASPART 100 UNIT/ML ~~LOC~~ SOLN
0.0000 [IU] | Freq: Three times a day (TID) | SUBCUTANEOUS | Status: DC
  Administered 2016-06-10: 1 [IU] via SUBCUTANEOUS
  Administered 2016-06-11: 2 [IU] via SUBCUTANEOUS
  Administered 2016-06-12 (×2): 1 [IU] via SUBCUTANEOUS

## 2016-06-10 MED ORDER — FOLIC ACID 1 MG PO TABS
500.0000 ug | ORAL_TABLET | Freq: Every day | ORAL | Status: DC
  Administered 2016-06-10 – 2016-06-13 (×4): 0.5 mg via ORAL
  Filled 2016-06-10 (×5): qty 1

## 2016-06-10 MED ORDER — FUROSEMIDE 10 MG/ML IJ SOLN
120.0000 mg | Freq: Two times a day (BID) | INTRAMUSCULAR | Status: DC
  Administered 2016-06-10 – 2016-06-13 (×6): 120 mg via INTRAVENOUS
  Filled 2016-06-10: qty 10
  Filled 2016-06-10 (×4): qty 12
  Filled 2016-06-10 (×2): qty 10

## 2016-06-10 MED ORDER — POLYVINYL ALCOHOL 1.4 % OP SOLN: 1.0000 [drp] | OPHTHALMIC | Status: DC | PRN

## 2016-06-10 MED ORDER — CARBOXYMETHYLCELLULOSE SODIUM 0.5 % OP SOLN: 1.0000 [drp] | Freq: Three times a day (TID) | OPHTHALMIC | Status: DC | PRN

## 2016-06-10 MED ORDER — DARBEPOETIN ALFA 100 MCG/0.5ML IJ SOSY: 100.0000 ug | PREFILLED_SYRINGE | INTRAMUSCULAR | Status: DC

## 2016-06-10 NOTE — Consult Note (Signed)
Cardiology Consult Note  Admit date: 06/10/2016 Name: Christopher [name] 83 y.o.  male DOB:  06-17-1926 MRN:  [id number]  Today's date:  06/10/2016  Referring Physician:    [facility]  Primary Physician:   Dagmar Hait  Reason for Consultation:   Heart failure, atrial fibrillation  IMPRESSIONS: 1.  Persistent atrial fibrillation likely causing chronic diastolic heart failure 2.  CAD with previous bypass grafting 3.  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  Chronic diastolic heart failure With acute worsening due to the development of atrial fibrillation 5.  Functioning permanent pacemaker for chronotropic incompetence previously  RECOMMENDATION: We'll try to arrange cardioversion tomorrow.  Cardioversion discussed with patient and family and they are agreeable to proceed.  Understand previous risks.  He has been anticoagulated 3 weeks now with therapeutic INRs.  Continue intravenous furosemide for the current time.  HISTORY: This 83 year old male has a history of CAD with previous bypass grafting and his grafts were patent a number of years ago.  He also has a previous permanent pacemaker inserted for chronotropic incontinence.  He has had atrial fibrillation and has tended to go into diastolic heart failure when his heart is out of rhythm.  He is had several cardioversions the last being in December.  He recently underwent today placement of a fistula for preparation for possible hemodialysis since his renal function has been getting worse.  Following that he was taken off of anticoagulation in preparation for that and he was started back.  He then was hospitalized with acute diastolic heart failure however cardiology was not notified.  He was noted that he gone back into atrial fibrillation.  He was subsequently seen at the office but had a subtherapeutic INR.  His diuretics were increased and he was to be seen back in 3 weeks once his INR was therapeutic to consider cardioversion again.  His amiodarone  was increased to 200 mg twice daily also.  Despite increasing his diuretics as an outpatient his weight went up and he was admitted last night with worsening shortness of breath.  His weight has continued to climb and he continues to be edematous.  He doesn't have any angina.  He did have an echocardiogram done at the office in the past 2 weeks that showed moderate to severe concentric LVH with a normal ejection fraction, moderate to severe left atrial enlargement  Past Medical History:  Diagnosis Date  . Anemia   . Atrial fibrillation ([place]) 08/07/2011   CHADS score  2 CHADS2 VASC score 4  Onset winter of 2013 Cardioversion May 2013 Reversion to a fib 3/14 amiodarone begun 07/02/12  Repeat cardioversion Jul 16, 2012    . BPH (benign prostatic hyperplasia)   . CAD (coronary artery disease) 03/06/2010   CABG with LIMA to LAD, SVG to OM-OM2  05/05/01 Dr. Roxan Hockey  Cardiac Cath 12/2009 60% left main, occluded LAD, Widely patent SVG to OM1 and 2, patent LIMA, widely patent RCA    . Cardiac pacemaker in situ    07/26/10  Inserted for chronotropic incompetence and dyspnea  RA lead  Medtronic 5076  serial number [id number]   RV lead  Medtronic 5076  serial number [id number] Medtronic Adapta RL pulse generator, SN  [id number]    . CHF (congestive heart failure) ([place])   . Chronic kidney disease (CKD), stage IV (severe) ([facility])    Dr. Mercy Moore  . Diabetes mellitus without complication ([place])    Type II  . History of skin cancer   . Hypertensive  heart disease without CHF   . Lumbar disc disease 1992  . Prostate cancer ([place])   . Sinus node dysfunction ([facility])    symptomatic bradycardia      Past Surgical History:  Procedure Laterality Date  . APPENDECTOMY  1980  . AV FISTULA PLACEMENT Left 11/08/2015   Procedure: ARTERIOVENOUS (AV) FISTULA CREATION;  Surgeon: [name], MD;  Location: [place];  Service: Vascular;  Laterality: Left;  . AV FISTULA PLACEMENT Left 01/03/2016   Procedure: Creation of  BRACHIOCEPHALIC ARTERIOVENOUS (AV) FISTULA Left arm;  Surgeon: [name], MD;  Location[facility] OR;  Service: Vascular;  Laterality: Left;  . AV FISTULA PLACEMENT Right 05/14/2016   Procedure: RADIOCEPHALIC ARTERIOVENOUS (AV) FISTULA CREATION RIGHT LOWER ARM;  Surgeon: Elam Dutch, MD;  Location: [place];  Service: Vascular;  Laterality: Right;  . CARDIAC CATHETERIZATION  2003  . CARDIOVERSION  08/08/2011   Procedure: CARDIOVERSION;  Surgeon: Jacolyn Reedy, MD;  Location: [place];  Service: Cardiovascular;  Laterality: N/A;  . CARDIOVERSION N/A 07/16/2012   Procedure: CARDIOVERSION;  Surgeon: Jacolyn Reedy, MD;  Location: [facility] ENDOSCOPY;  Service: Cardiovascular;  Laterality: N/A;  . CARDIOVERSION N/A 02/27/2015   Procedure: CARDIOVERSION;  Surgeon: Jacolyn Reedy, MD;  Location: [facility] ENDOSCOPY;  Service: Cardiovascular;  Laterality: N/A;  . CARDIOVERSION N/A 09/21/2015   Procedure: CARDIOVERSION;  Surgeon: Jacolyn Reedy, MD;  Location: [facility] ENDOSCOPY;  Service: Cardiovascular;  Laterality: N/A;  . CARDIOVERSION N/A 02/22/2016   Procedure: CARDIOVERSION;  Surgeon: Dorothy Spark, MD;  Location: [facility] ENDOSCOPY;  Service: Cardiovascular;  Laterality: N/A;  . cataracts removed    . COLONOSCOPY    . CORONARY ARTERY BYPASS GRAFT  2003   x3  . CYSTOSCOPY WITH BIOPSY N/A 06/21/2013   Procedure: CYSTOSCOPY WITH BIOPSY;  Surgeon: Molli Hazard, MD;  Location: [facility];  Service: Urology;  Laterality: N/A;    TUR RESECTION OF POLYP BILATERAL RETROGRADE PYELOGRAM BLADDER BIOPSY    . EYE SURGERY Bilateral    Cataracts  . HIP PINNING,CANNULATED Right 06/30/2014   Procedure: CANNULATED HIP PINNING;  Surgeon: Renette Butters, MD;  Location: [place];  Service: Orthopedics;  Laterality: Right;  . LIGATION OF ARTERIOVENOUS  FISTULA Left 01/17/2016   Procedure: LIGATION OF ARTERIOVENOUS  FISTULA UPPER ARM FISTULA;  Surgeon: [name], MD;  Location: [place];  Service: Vascular;  Laterality: Left;  .  LUMBAR LAMINECTOMY  11/01  . PACEMAKER INSERTION  2012  . PROSTATE BIOPSY N/A 06/21/2013   Procedure: BIOPSY TRANSRECTAL ULTRASONIC PROSTATE (TUBP);  Surgeon: Molli Hazard, MD;  Location: [facility];  Service: Urology;  Laterality: N/A;  PROSTATE NERVE BLOCK     Allergies:  is allergic to crestor [rosuvastatin]; lipitor [atorvastatin]; statins; penicillins; and tramadol.   Medications: Prior to Admission medications   Medication Sig Start Date End Date Taking? Authorizing Provider  amiodarone (PACERONE) 200 MG tablet Take 1 tablet (200 mg total) by mouth every morning. 02/28/15  Yes Jacolyn Reedy, MD  carboxymethylcellulose (REFRESH PLUS) 0.5 % SOLN Place 1 drop into both eyes 3 (three) times daily as needed (for dry eyes.).   Yes Historical Provider, MD  Darbepoetin Alfa (ARANESP) 100 MCG/0.5ML SOSY injection Inject 100 mcg into the skin every 21 ( twenty-one) days.   Yes Historical Provider, MD  folic acid (FOLVITE) 671 MCG tablet Take 400 mcg by mouth daily.     Yes Historical Provider, MD  furosemide (LASIX) 80 MG tablet Take 1.5  tablets (120 mg total) by mouth 2 (two) times daily. Take 120 mg twice a day through Thursday, then reduce dose to 80 mg twice a day. Patient taking differently: Take 80-120 mg by mouth 2 (two) times daily. Take 120 mg in the morning and 80 mg in the evening 05/20/16  Yes Christina P Rama, MD  glimepiride (AMARYL) 1 MG tablet Take 1 tablet (1 mg total) by mouth daily before breakfast. 07/13/14  Yes Ivan Anchors Love, PA-C  Glucosamine HCl 1500 MG TABS Take 1,500 mg by mouth 2 (two) times daily.    Yes Historical Provider, MD  [place] 300 MG CAPS Take 300 mg by mouth daily.    Yes Historical Provider, MD  HYDROcodone-acetaminophen (NORCO/VICODIN) 5-325 MG tablet Take 1 tablet by mouth every 6 (six) hours as needed. For pain. 05/14/16  Yes Alvia Grove, PA-C  metoprolol succinate (TOPROL-XL) 25 MG 24 hr tablet Take 25 mg by mouth daily. 11/30/15  Yes  Historical Provider, MD  Multiple Vitamin (MULTIVITAMIN WITH MINERALS) TABS tablet Take 1 tablet by mouth daily.   Yes Historical Provider, MD  nitroGLYCERIN (NITROSTAT) 0.4 MG SL tablet Place 0.4 mg under the tongue every 5 (five) minutes as needed for chest pain. 10/26/15  Yes Historical Provider, MD  Omega-3 Fatty Acids (FISH OIL) 1200 MG CAPS Take 1,200 mg by mouth 2 (two) times daily.    Yes Historical Provider, MD  Polyethylene Glycol 3350 (MIRALAX PO) Take 17 g by mouth daily.    Yes Historical Provider, MD  warfarin (COUMADIN) 3 MG tablet Take 1 tablet (3 mg total) by mouth daily. Patient taking differently: Take 4 mg by mouth every evening.  05/15/16  Yes Alvia Grove, PA-C    Family History: Family Status  Relation Status  . Mother Deceased at age 37  . Father Deceased at age 39  . Sister   . Brother Alive    Social History:   reports that he quit smoking about 48 years ago. His smoking use included Cigarettes. He has a 18.00 pack-year smoking history. He quit smokeless tobacco use about 48 years ago. His smokeless tobacco use included Snuff and Chew. He reports that he does not drink alcohol or use drugs.   Review of Systems: Other than as noted above remainder of the review of systems is unremarkable.  Physical Exam: BP 114/72 (BP Location: Right Arm)   Pulse 70   Temp 98.8 F (37.1 C) (Oral)   Resp 18   Ht 6\' 2"  (1.88 m)   Wt 109.1 kg (240 lb 8 oz)   SpO2 100%   BMI 30.88 kg/m    General appearance: Pleasant somewhat pale obese white male lying in bed wearing oxygen in no acute distress Head: Normocephalic, without obvious abnormality, atraumatic, Balding male hair pattern Eyes: conjunctivae/corneas clear. PERRL, EOM's intact. Fundi not examined  Neck: no adenopathy, no carotid bruit, no JVD and supple, symmetrical, trachea midline Lungs: Reduced breath sounds at the bases Heart: Irregular rhythm, normal S1-S2 no S3 Abdomen: soft, non-tender; bowel sounds  normal; no masses,  no organomegaly Rectal: deferred Extremities: Previous saphenous vein harvesting scars noted in the left lower extremity, 1-2+ edema noted. Pulses: 2+ and symmetric Skin: Skin color, texture, turgor normal. No rashes or lesions Neurologic: Grossly normal Psych: Alert and oriented x 3  Labs: CBC  Recent Labs  06/10/16 0203  WBC 7.7  RBC 3.30*  HGB 10.1*  HCT 33.7*  PLT 236  MCV 102.1[facility]  30.6  MCHC 30.0  RDW 20.7*  LYMPHSABS 0.9  MONOABS 0.4  EOSABS 0.0  BASOSABS 0.0   CMP   Recent Labs  06/10/16 0203 06/10/16 0917  NA 140 141  K 4.4 5.2*  CL 103 107  CO2 25 19*  GLUCOSE 95 99  BUN 77* 76*  CREATININE 3.24* 3.04*  CALCIUM 8.8* 8.6*  PROT 6.6  --   ALBUMIN 3.5  --   AST 31  --   ALT 23  --   ALKPHOS 53  --   BILITOT 1.2  --   GFRNONAA 16* 17*  GFRAA 18* 20*   BNP (last 3 results) BNP    Component Value Date/Time   BNP 920.1 (H) 06/10/2016 0203   Cardiac Panel (last 3 results) Troponin (Point of Care Test)  Recent Labs  06/10/16 0215  TROPIPOC 0.01   Cardiac Panel (last 3 results)  Recent Labs  06/10/16 0917  TROPONINI <0.03     Radiology:  Cardiomegaly, basilar atelectasis  EKG: Atrial fibrillation underlying rhythm, paced rhythm seen IV conduction delay Independently reviewed by me  Signed:  W. Doristine Church MD [facility]   Cardiology Consultant  06/10/2016, 1:41 PM

## 2016-06-10 NOTE — H&P (Signed)
History and Physical    Christopher [name] JEH:[id number] DOB: Dec 04, 1926 DOA: 06/10/2016  PCP: Tivis Ringer, MD  Patient coming from: Home  Chief Complaint: Shortness of breath, orthopnea  HPI: Christopher [name] is a 83 y.o. male with medical history significant atrial fibrillation, status post pacemaker placement for symptomatic bradycardia of 6 sinus syndrome, coronary artery disease with history of CABG, CHF, CK D, diabetes mellitus, hypertension, end-stage renal disease who presented to the emergency department complaints of shortness of breath over the period of the last 2-3 days. Has not attempted anything to improve symptoms. Worse w/ exertion. No associated fevers. Symptoms are constant and getting worse.  Patient denied any associated chest pain, palpitations, abdominal pain, nausea vomiting    ED Course: On arrival to the emergency department he had stable vital signs, blood work demonstrated mild anemia with hemoglobin 10.1 g/dL, 77 and creatinine 3.24, INR 2.50 BNP was elevated to 920.1 (most recent BMP was 1114.6 on 05/17/2016), troponin 0.03 EKG showed atrial fibrillation, control ventricular rate, new T-wave inversions in inferolateral leads Chest x-ray showed stable cardiomegaly, cephalization or effusion. Echocardiogram from 2015 showed EF 37-85%, diastolic dysfunction of intermediate grade, mild to moderate TR and mild MR  Review of Systems: As per HPI otherwise 10 point review of systems negative.   Ambulatory Status: Independent  Past Medical History:  Diagnosis Date  . Anemia   . Atrial fibrillation ([place]) 08/07/2011   CHADS score  2 CHADS2 VASC score 4  Onset winter of 2013 Cardioversion May 2013 Reversion to a fib 3/14 amiodarone begun 07/02/12  Repeat cardioversion Jul 16, 2012    . BPH (benign prostatic hyperplasia)   . CAD (coronary artery disease)    followed by Dr. Wynonia Lawman  . CAD (coronary artery disease) 03/06/2010   CABG with LIMA to LAD, SVG to OM-OM2  05/05/01  Dr. Roxan Hockey  Cardiac Cath 12/2009 60% left main, occluded LAD, Widely patent SVG to OM1 and 2, patent LIMA, widely patent RCA    . Cardiac pacemaker in situ    07/26/10  Inserted for chronotropic incompetence and dyspnea  RA lead  Medtronic 5076  serial number [id number]   RV lead  Medtronic 5076  serial number [id number] Medtronic Adapta RL pulse generator, SN  [id number]    . CHF (congestive heart failure) ([place])   . Chronic kidney disease    kidneys function at "20 %" followed by Dr. Mercy Moore  . Diabetes mellitus without complication ([place])    Type II  . Dysrhythmia   . History of skin cancer   . HTN (hypertension)   . Hypertensive heart disease without CHF   . Lumbar disc disease   . Lumbar disc disease 1992  . Pacemaker    Medtronic-dual-chamber-DOI 2012  . Prostate cancer ([place])   . Shortness of breath dyspnea    with exertion  . Sinus node dysfunction (HCC)    symptomatic bradycardia    Past Surgical History:  Procedure Laterality Date  . APPENDECTOMY  1980  . AV FISTULA PLACEMENT Left 11/08/2015   Procedure: ARTERIOVENOUS (AV) FISTULA CREATION;  Surgeon: [name], MD;  Location: [place];  Service: Vascular;  Laterality: Left;  . AV FISTULA PLACEMENT Left 01/03/2016   Procedure: Creation of BRACHIOCEPHALIC ARTERIOVENOUS (AV) FISTULA Left arm;  Surgeon: [name], MD;  Location: [place];  Service: Vascular;  Laterality: Left;  . AV FISTULA PLACEMENT Right 05/14/2016   Procedure: RADIOCEPHALIC ARTERIOVENOUS (AV) FISTULA CREATION RIGHT LOWER ARM;  Surgeon: Juanda Crumble  Antony Blackbird, MD;  Location: [place];  Service: Vascular;  Laterality: Right;  . CARDIAC CATHETERIZATION  2003  . CARDIOVERSION  08/08/2011   Procedure: CARDIOVERSION;  Surgeon: Jacolyn Reedy, MD;  Location: [place];  Service: Cardiovascular;  Laterality: N/A;  . CARDIOVERSION N/A 07/16/2012   Procedure: CARDIOVERSION;  Surgeon: Jacolyn Reedy, MD;  Location: [facility] ENDOSCOPY;  Service: Cardiovascular;  Laterality: N/A;  .  CARDIOVERSION N/A 02/27/2015   Procedure: CARDIOVERSION;  Surgeon: Jacolyn Reedy, MD;  Location: [facility] ENDOSCOPY;  Service: Cardiovascular;  Laterality: N/A;  . CARDIOVERSION N/A 09/21/2015   Procedure: CARDIOVERSION;  Surgeon: Jacolyn Reedy, MD;  Location: [facility] ENDOSCOPY;  Service: Cardiovascular;  Laterality: N/A;  . CARDIOVERSION N/A 02/22/2016   Procedure: CARDIOVERSION;  Surgeon: Dorothy Spark, MD;  Location: [facility] ENDOSCOPY;  Service: Cardiovascular;  Laterality: N/A;  . cataracts removed    . COLONOSCOPY    . CORONARY ARTERY BYPASS GRAFT  2003   x3  . CYSTOSCOPY WITH BIOPSY N/A 06/21/2013   Procedure: CYSTOSCOPY WITH BIOPSY;  Surgeon: Molli Hazard, MD;  Location: [facility];  Service: Urology;  Laterality: N/A;    TUR RESECTION OF POLYP BILATERAL RETROGRADE PYELOGRAM BLADDER BIOPSY    . EYE SURGERY Bilateral    Cataracts  . HIP PINNING,CANNULATED Right 06/30/2014   Procedure: CANNULATED HIP PINNING;  Surgeon: Renette Butters, MD;  Location: [place];  Service: Orthopedics;  Laterality: Right;  . LIGATION OF ARTERIOVENOUS  FISTULA Left 01/17/2016   Procedure: LIGATION OF ARTERIOVENOUS  FISTULA UPPER ARM FISTULA;  Surgeon: [name], MD;  Location: [place];  Service: Vascular;  Laterality: Left;  . LUMBAR LAMINECTOMY  11/01  . PACEMAKER INSERTION  2012  . PROSTATE BIOPSY N/A 06/21/2013   Procedure: BIOPSY TRANSRECTAL ULTRASONIC PROSTATE (TUBP);  Surgeon: Molli Hazard, MD;  Location: [facility];  Service: Urology;  Laterality: N/A;  PROSTATE NERVE BLOCK    Social History   Social History  . Marital status: Married    Spouse name: N/A  . Number of children: 3  . Years of education: N/A   Occupational History  . retired    Social History Main Topics  . Smoking status: Former Smoker    Packs/day: 0.75    Years: 24.00    Types: Cigarettes    Quit date: 03/12/1968  . Smokeless tobacco: Former Systems developer    Types: Snuff, Chew    Quit date: 03/11/1968  . Alcohol use No  .  Drug use: No  . Sexual activity: Not Currently   Other Topics Concern  . Not on file   Social History Narrative  . No narrative on file    Allergies  Allergen Reactions  . Crestor [Rosuvastatin] Other (See Comments)    "ACHING JOINTS"  . Lipitor [Atorvastatin] Other (See Comments)    "ACHING JOINTS"  . Statins Other (See Comments)    "ACHING JOINTS"  . Penicillins Rash and Other (See Comments)    Has patient had a PCN reaction causing immediate rash, facial/tongue/throat swelling, SOB or lightheadedness with hypotension: yes Has patient had a PCN reaction causing severe rash involving mucus membranes or skin necrosis: no Has patient had a PCN reaction that required hospitalization no Has patient had a PCN reaction occurring within the last 10 years: no If all of the above answers are "NO", then may proceed with Cephalosporin use.   . Tramadol Nausea Only    Family History  Problem Relation Age of Onset  .  Cancer Mother     breast  . Diabetes Mother   . Cancer Father     prostate, esophagus    Prior to Admission medications   Medication Sig Start Date End Date Taking? Authorizing Provider  amiodarone (PACERONE) 200 MG tablet Take 1 tablet (200 mg total) by mouth every morning. 02/28/15  Yes Jacolyn Reedy, MD  carboxymethylcellulose (REFRESH PLUS) 0.5 % SOLN Place 1 drop into both eyes 3 (three) times daily as needed (for dry eyes.).   Yes Historical Provider, MD  Darbepoetin Alfa (ARANESP) 100 MCG/0.5ML SOSY injection Inject 100 mcg into the skin every 21 ( twenty-one) days.   Yes Historical Provider, MD  folic acid (FOLVITE) 315 MCG tablet Take 400 mcg by mouth daily.     Yes Historical Provider, MD  furosemide (LASIX) 80 MG tablet Take 1.5 tablets (120 mg total) by mouth 2 (two) times daily. Take 120 mg twice a day through Thursday, then reduce dose to 80 mg twice a day. Patient taking differently: Take 80-120 mg by mouth 2 (two) times daily. Take 120 mg in the morning  and 80 mg in the evening 05/20/16  Yes Christina P Rama, MD  glimepiride (AMARYL) 1 MG tablet Take 1 tablet (1 mg total) by mouth daily before breakfast. 07/13/14  Yes Ivan Anchors Love, PA-C  Glucosamine HCl 1500 MG TABS Take 1,500 mg by mouth 2 (two) times daily.    Yes Historical Provider, MD  [place] 300 MG CAPS Take 300 mg by mouth daily.    Yes Historical Provider, MD  HYDROcodone-acetaminophen (NORCO/VICODIN) 5-325 MG tablet Take 1 tablet by mouth every 6 (six) hours as needed. For pain. 05/14/16  Yes Alvia Grove, PA-C  metoprolol succinate (TOPROL-XL) 25 MG 24 hr tablet Take 25 mg by mouth daily. 11/30/15  Yes Historical Provider, MD  Multiple Vitamin (MULTIVITAMIN WITH MINERALS) TABS tablet Take 1 tablet by mouth daily.   Yes Historical Provider, MD  nitroGLYCERIN (NITROSTAT) 0.4 MG SL tablet Place 0.4 mg under the tongue every 5 (five) minutes as needed for chest pain. 10/26/15  Yes Historical Provider, MD  Omega-3 Fatty Acids (FISH OIL) 1200 MG CAPS Take 1,200 mg by mouth 2 (two) times daily.    Yes Historical Provider, MD  Polyethylene Glycol 3350 (MIRALAX PO) Take 17 g by mouth daily.    Yes Historical Provider, MD  warfarin (COUMADIN) 3 MG tablet Take 1 tablet (3 mg total) by mouth daily. Patient taking differently: Take 4 mg by mouth every evening.  05/15/16  Yes Alvia Grove, PA-C    Physical Exam: Vitals:   06/10/16 0330 06/10/16 0401 06/10/16 0430 06/10/16 0542  BP: 137/73 (!) 153/78 (!) 141/72 (!) 145/84  Pulse: 70 71 82 74  Resp: 18 (!) 22 (!) 23   Temp:    97.9 F (36.6 C)  TempSrc:    Oral  SpO2: 97% 98% 98% 100%  Weight:    109.1 kg (240 lb 8 oz)  Height:    6\' 2"  (1.88 m)     General: Appears calm and comfortable Eyes: PERRLA, EOMI, normal lids, iris ENT:  grossly normal hearing, lips & tongue, mucous membranes moist and intact Neck: no lymphoadenopathy, masses or thyromegaly Cardiovascular: RRR, no m/r/g. No JVD, carotid bruits. No significant LE edema.    Respiratory: bilateral no wheezes, rales, rhonchi, faint bilateral cracles. Normal respiratory effort. No accessory muscle use observed Abdomen: soft, non-tender, non-distended, no organomegaly or masses appreciated. BS present in all  quadrants Skin: no rash, ulcers or induration seen on limited exam Musculoskeletal: grossly normal tone BUE/BLE, good ROM, no bony abnormality or joint deformities observed Psychiatric: grossly normal mood and affect, speech fluent and appropriate, alert and oriented x3 Neurologic: CN II-XII grossly intact, moves all extremities in coordinated fashion, sensation intact  Labs on Admission: I have personally reviewed following labs and imaging studies  CBC, BMP  GFR: Estimated Creatinine Clearance: 22.5 mL/min (A) (by C-G formula based on SCr of 3.04 mg/dL (H)).   Creatinine Clearance: Estimated Creatinine Clearance: 22.5 mL/min (A) (by C-G formula based on SCr of 3.04 mg/dL (H)).    Radiological Exams on Admission: Dg Chest 2 View  Result Date: 06/10/2016 CLINICAL DATA:  Dyspnea tonight.  Intermittent for several weeks. EXAM: CHEST  2 VIEW COMPARISON:  05/19/2016 FINDINGS: Moderate cardiomegaly, unchanged. Stable chronic elevation of the right hemidiaphragm. Mild linear basilar scarring or atelectasis, unchanged. No confluent airspace consolidation. No effusion. Normal pulmonary vasculature. IMPRESSION: Stable cardiomegaly. Mild linear atelectatic appearing lung base opacities. No consolidation or effusion. Electronically Signed   By: Andreas Newport M.D.   On: 06/10/2016 02:36    EKG: Independently reviewed -  atrial fibrillation, control ventricular rate, new T-wave inversions in inferolateral leads   Assessment/Plan Principal Problem:   Acute on chronic diastolic CHF (congestive heart failure), NYHA class 2 (HCC) Active Problems:   Atrial fibrillation (HCC)   Hyperlipidemia   Chronic kidney disease (CKD), stage IV (severe) (HCC)   DM type 2  causing renal disease (HCC)   Anemia of chronic disease   HTN (hypertension)   Exacerbation of diastolic CHF  Continue IV diuresis Monitor I/o and daily weight, maintain low salt and fluid restricted diet  CAD, s/p CABG - patient denied any chest pain Abnormal ST -TW EKG changes most likely associated with myocardial strain Continue betablocker and monitor   Atrial fibrillation - EKG revealed Afib and patient does not feel palpations, rate is controlled, s/p DCCV on 02/22/16 On coumadin with therapeutic INR Will keep him NPO in case he can undergo another DCCV later today and ask cardiology to see patient Continue Coumadin per pharmacy, Amiodarone and beta blocker  End-stage renal disease - stage V Patient is on high doe diuretic at home and is still able to produce urine Continue to monitor renal indices  Diabetes mellitus type II Hold Amaryl and initiated SSI. Low carb diet, CBG's qas and HS  Hypertension - controlled, continue current meds and monitor    Anemia of chronic disease - stable on Aranesp injections Continue to monitor   DVT prophylaxis: Coumadin \\Code  Status:  Family Communication: at bedside Disposition Plan: telemetry Consults called: cardiology Admission status: observation     Caprice Red Pager: [phone] [facility]  If 7PM-7AM, please contact night-coverage [email or web address] Password [facility]  06/10/2016, 11:23 AM     Attending MD note  Patient was seen, examined,treatment plan was discussed with the  Advance Practice Provider.  I have personally reviewed the clinical findings, lab,EKG, imaging studies and management of this patient in detail.I have also reviewed the orders written for this patient which were under my direction. I agree with the documentation, as recorded by the Advance Practice Provider.   Christopher [name] is a 83 y.o. male who presents with h/o worsening SOB and orthopnea. Pt did not try anything to make sx better.  Worsen w/ exertion or lying down. Likely from CHF exacerbation in setting of CKD 5 w/ cardiorenal syndrome. Current episode may have  been set off by return of Afib after cardioversion in 2017. Currently not on dialysis. Appreciate cardiology evaluation.     MERRELL, Grayling Congress, MD Family Medicine See Amion for pager # [facility]

## 2016-06-10 NOTE — Progress Notes (Signed)
Christopher [name] is a 83 year old male with a past medical history significant for A.fib, HTN, CHF, DM type II, CAD, s/p PM,  and ESRD; presents with complaints of SOB. Associated symptoms include orthopnea and DOE.CXR showing stable cardiomegaly. BNP 920.1 Given 60 mg of Lasix IV in the ED. Admitted for suspected CHF exacerbation to a telemetry bed.

## 2016-06-10 NOTE — ED Triage Notes (Signed)
Per EMS: Pt come from home. Pt has hx of CHF and has c/o of SOB for the past week getting worse tonight.  Pt unable to lay down and was having a hard time catching his breath.

## 2016-06-10 NOTE — Progress Notes (Signed)
ANTICOAGULATION CONSULT NOTE - Initial Consult  Pharmacy Consult for warfarin Indication: atrial fibrillation  Allergies  Allergen Reactions  . Crestor [Rosuvastatin] Other (See Comments)    "ACHING JOINTS"  . Lipitor [Atorvastatin] Other (See Comments)    "ACHING JOINTS"  . Statins Other (See Comments)    "ACHING JOINTS"  . Penicillins Rash and Other (See Comments)    Has patient had a PCN reaction causing immediate rash, facial/tongue/throat swelling, SOB or lightheadedness with hypotension: yes Has patient had a PCN reaction causing severe rash involving mucus membranes or skin necrosis: no Has patient had a PCN reaction that required hospitalization no Has patient had a PCN reaction occurring within the last 10 years: no If all of the above answers are "NO", then may proceed with Cephalosporin use.   . Tramadol Nausea Only    Patient Measurements: Height: 6\' 2"  (188 cm) Weight: 240 lb 8 oz (109.1 kg) IBW/kg (Calculated) : 82.2  Vital Signs: Temp: 97.9 F (36.6 C) (04/02 0542) Temp Source: Oral (04/02 0542) BP: 145/84 (04/02 0542) Pulse Rate: 74 (04/02 0542)  Labs:  Recent Labs  06/10/16 0203  HGB 10.1*  HCT 33.7*  PLT 236  LABPROT 27.5*  INR 2.50  CREATININE 3.24*    Estimated Creatinine Clearance: 21.1 mL/min (A) (by C-G formula based on SCr of 3.24 mg/dL (H)).  Assessment: CC/HPI: 83 yo m presenting with SOB  PMH: CHF, PPM, CAD, afib on warfarin, CKD (not on HD)  Anticoag: warfarin pta for afib. Admit INR 2.5  PTA warfarin 4 mg daily - last dose 4/1  Renal: SCr 3.24   Heme/Onc: H&H 10.1/33.7, Plt 236  Goal of Therapy:  INR 2-3 Monitor platelets by anticoagulation protocol: Yes   Plan:  Warfarin 4 mg daily Daily INR, CBC q72h Monitor s/sx of bleeding  Levester Fresh, PharmD, BCPS, BCCCP Clinical Pharmacist Clinical phone for 06/10/2016 from 7a-3:30p: [phone] If after 3:30p, please call main pharmacy at: [phone] 06/10/2016 8:57 AM

## 2016-06-10 NOTE — ED Provider Notes (Signed)
[facility] DEPT Provider Note   CSN: [id number] Arrival date & time: 06/10/16  0111     History   Chief Complaint Chief Complaint  Patient presents with  . Shortness of Breath    HPI Christopher [name] is a 83 y.o. male with a hx of CHF with pacemaker, CAD, atrial fibrillation, chronic kidney disease (not yet on dialysis) and PSHx of CABG presents to the Emergency Department complaining of gradual, persistent, progressively worsening SOB over the last several weeks with acute worsening tonight.  Pt reports over the last several weeks he has had some DOE and orthopnea, but tonight while laying flat, he felt as if he couldn't catch is breath at all.  Sitting up improved his symptoms but his SOB did not resolve completely.  Associated symptoms include persistent, but not worsened, bilateral leg swelling.  Pt denies fever, chills, cough, congestion, CP, abd pain, N/V/D.     The history is provided by the patient and medical records. No language interpreter was used.    Past Medical History:  Diagnosis Date  . Anemia   . Atrial fibrillation ([place]) 08/07/2011   CHADS score  2 CHADS2 VASC score 4  Onset winter of 2013 Cardioversion May 2013 Reversion to a fib 3/14 amiodarone begun 07/02/12  Repeat cardioversion Jul 16, 2012    . BPH (benign prostatic hyperplasia)   . CAD (coronary artery disease)    followed by Dr. Wynonia Lawman  . CAD (coronary artery disease) 03/06/2010   CABG with LIMA to LAD, SVG to OM-OM2  05/05/01 Dr. Roxan Hockey  Cardiac Cath 12/2009 60% left main, occluded LAD, Widely patent SVG to OM1 and 2, patent LIMA, widely patent RCA    . Cardiac pacemaker in situ    07/26/10  Inserted for chronotropic incompetence and dyspnea  RA lead  Medtronic 5076  serial number [id number]   RV lead  Medtronic 5076  serial number [id number] Medtronic Adapta RL pulse generator, SN  [id number]    . CHF (congestive heart failure) ([place])   . Chronic kidney disease    kidneys function at "20 %" followed by  Dr. Mercy Moore  . Diabetes mellitus without complication ([place])    Type II  . Dysrhythmia   . History of skin cancer   . HTN (hypertension)   . Hypertensive heart disease without CHF   . Lumbar disc disease   . Lumbar disc disease 1992  . Pacemaker    Medtronic-dual-chamber-DOI 2012  . Prostate cancer ([place])   . Shortness of breath dyspnea    with exertion  . Sinus node dysfunction (HCC)    symptomatic bradycardia    Patient Active Problem List   Diagnosis Date Noted  . Acute pulmonary edema ([place]) 05/17/2016  . Obesity (BMI 30-39.9) 05/17/2016  . Anemia of chronic disease 10/05/2015  . Hypoxia 07/20/2015  . Fall   . Acute on chronic diastolic CHF (congestive heart failure), NYHA class 2 ([place]) 07/24/2013  . DM type 2 causing renal disease ([place]) 07/24/2013  . Pulmonary nodule with restrictive lung disease 07/24/2013  . Malignant neoplasm of prostate ([place]) 07/16/2013  . History of amiodarone therapy 07/16/2012  . Atrial fibrillation ([place]) 08/07/2011  . Lumbar disc disease   . Hyperlipidemia   . Hypertensive heart disease without CHF   . Chronic kidney disease (CKD), stage IV (severe) ([place])   . Long-term (current) use of anticoagulants   . BPH (benign prostatic hyperplasia)   . Cardiac pacemaker in situ   . CAD (coronary artery  disease) 03/06/2010    Past Surgical History:  Procedure Laterality Date  . APPENDECTOMY  1980  . AV FISTULA PLACEMENT Left 11/08/2015   Procedure: ARTERIOVENOUS (AV) FISTULA CREATION;  Surgeon: [name], MD;  Location: [place];  Service: Vascular;  Laterality: Left;  . AV FISTULA PLACEMENT Left 01/03/2016   Procedure: Creation of BRACHIOCEPHALIC ARTERIOVENOUS (AV) FISTULA Left arm;  Surgeon: [name], MD;  Location: [facility] OR;  Service: Vascular;  Laterality: Left;  . AV FISTULA PLACEMENT Right 05/14/2016   Procedure: RADIOCEPHALIC ARTERIOVENOUS (AV) FISTULA CREATION RIGHT LOWER ARM;  Surgeon: Elam Dutch, MD;  Location: [place];  Service:  Vascular;  Laterality: Right;  . CARDIAC CATHETERIZATION  2003  . CARDIOVERSION  08/08/2011   Procedure: CARDIOVERSION;  Surgeon: Jacolyn Reedy, MD;  Location: [place];  Service: Cardiovascular;  Laterality: N/A;  . CARDIOVERSION N/A 07/16/2012   Procedure: CARDIOVERSION;  Surgeon: Jacolyn Reedy, MD;  Location: [facility] ENDOSCOPY;  Service: Cardiovascular;  Laterality: N/A;  . CARDIOVERSION N/A 02/27/2015   Procedure: CARDIOVERSION;  Surgeon: Jacolyn Reedy, MD;  Location: [facility] ENDOSCOPY;  Service: Cardiovascular;  Laterality: N/A;  . CARDIOVERSION N/A 09/21/2015   Procedure: CARDIOVERSION;  Surgeon: Jacolyn Reedy, MD;  Location: [facility] ENDOSCOPY;  Service: Cardiovascular;  Laterality: N/A;  . CARDIOVERSION N/A 02/22/2016   Procedure: CARDIOVERSION;  Surgeon: Dorothy Spark, MD;  Location: [facility] ENDOSCOPY;  Service: Cardiovascular;  Laterality: N/A;  . cataracts removed    . COLONOSCOPY    . CORONARY ARTERY BYPASS GRAFT  2003   x3  . CYSTOSCOPY WITH BIOPSY N/A 06/21/2013   Procedure: CYSTOSCOPY WITH BIOPSY;  Surgeon: Molli Hazard, MD;  Location: [facility] ORS;  Service: Urology;  Laterality: N/A;    TUR RESECTION OF POLYP BILATERAL RETROGRADE PYELOGRAM BLADDER BIOPSY    . EYE SURGERY Bilateral    Cataracts  . HIP PINNING,CANNULATED Right 06/30/2014   Procedure: CANNULATED HIP PINNING;  Surgeon: Renette Butters, MD;  Location: [place];  Service: Orthopedics;  Laterality: Right;  . LIGATION OF ARTERIOVENOUS  FISTULA Left 01/17/2016   Procedure: LIGATION OF ARTERIOVENOUS  FISTULA UPPER ARM FISTULA;  Surgeon: [name], MD;  Location: [place];  Service: Vascular;  Laterality: Left;  . LUMBAR LAMINECTOMY  11/01  . PACEMAKER INSERTION  2012  . PROSTATE BIOPSY N/A 06/21/2013   Procedure: BIOPSY TRANSRECTAL ULTRASONIC PROSTATE (TUBP);  Surgeon: Molli Hazard, MD;  Location: [facility];  Service: Urology;  Laterality: N/A;  PROSTATE NERVE BLOCK       Home Medications    Prior to Admission  medications   Medication Sig Start Date End Date Taking? Authorizing Provider  amiodarone (PACERONE) 200 MG tablet Take 1 tablet (200 mg total) by mouth every morning. 02/28/15   Jacolyn Reedy, MD  carboxymethylcellulose (REFRESH PLUS) 0.5 % SOLN Place 1 drop into both eyes 3 (three) times daily as needed (for dry eyes.).    Historical Provider, MD  Darbepoetin Alfa (ARANESP) 100 MCG/0.5ML SOSY injection Inject 100 mcg into the skin every 21 ( twenty-one) days.    Historical Provider, MD  folic acid (FOLVITE) 188 MCG tablet Take 400 mcg by mouth daily.      Historical Provider, MD  furosemide (LASIX) 80 MG tablet Take 1.5 tablets (120 mg total) by mouth 2 (two) times daily. Take 120 mg twice a day through Thursday, then reduce dose to 80 mg twice a day. 05/20/16   Venetia Maxon Rama, MD  glimepiride (AMARYL)  1 MG tablet Take 1 tablet (1 mg total) by mouth daily before breakfast. 07/13/14   Bary Leriche, PA-C  Glucosamine HCl 1500 MG TABS Take 1,500 mg by mouth 2 (two) times daily.     Historical Provider, MD  [place] 300 MG CAPS Take 300 mg by mouth daily.     Historical Provider, MD  HYDROcodone-acetaminophen (NORCO/VICODIN) 5-325 MG tablet Take 1 tablet by mouth every 6 (six) hours as needed. For pain. 05/14/16   Alvia Grove, PA-C  metoprolol succinate (TOPROL-XL) 25 MG 24 hr tablet Take 25 mg by mouth daily. 11/30/15   Historical Provider, MD  Multiple Vitamin (MULTIVITAMIN WITH MINERALS) TABS tablet Take 1 tablet by mouth daily.    Historical Provider, MD  nitroGLYCERIN (NITROSTAT) 0.4 MG SL tablet Place 0.4 mg under the tongue every 5 (five) minutes as needed for chest pain. 10/26/15   Historical Provider, MD  Omega-3 Fatty Acids (FISH OIL) 1200 MG CAPS Take 1,200 mg by mouth 2 (two) times daily.     Historical Provider, MD  Polyethylene Glycol 3350 (MIRALAX PO) Take 17 g by mouth daily.     Historical Provider, MD  warfarin (COUMADIN) 3 MG tablet Take 1 tablet (3 mg total) by mouth  daily. 05/15/16   Alvia Grove, PA-C    Family History Family History  Problem Relation Age of Onset  . Cancer Mother     breast  . Cancer Father     prostate, esophagus    Social History Social History  Substance Use Topics  . Smoking status: Former Smoker    Packs/day: 0.75    Years: 24.00    Types: Cigarettes    Quit date: 03/12/1968  . Smokeless tobacco: Former Systems developer    Types: Snuff, Chew    Quit date: 03/11/1968  . Alcohol use No     Allergies   Crestor [rosuvastatin]; Lipitor [atorvastatin]; Statins; Penicillins; and Tramadol   Review of Systems Review of Systems  Respiratory: Positive for shortness of breath.   Cardiovascular: Positive for leg swelling ( baseline).  All other systems reviewed and are negative.    Physical Exam Updated Vital Signs BP (!) 145/75 (BP Location: Right Arm)   Pulse 75   Temp 97.3 F (36.3 C) (Oral)   Resp 20   Ht 6' (1.829 m)   Wt 106.6 kg   SpO2 96%   BMI 31.87 kg/m   Physical Exam  Constitutional: He appears well-developed and well-nourished. No distress.  Awake, alert, nontoxic appearance  HENT:  Head: Normocephalic and atraumatic.  Mouth/Throat: Oropharynx is clear and moist. No oropharyngeal exudate.  Eyes: Conjunctivae are normal. No scleral icterus.  Neck: Normal range of motion. Neck supple.  Cardiovascular: Normal rate, regular rhythm and intact distal pulses.   Pulmonary/Chest: Effort normal. No accessory muscle usage. No tachypnea. No respiratory distress. He has no wheezes. He has rales in the right lower field and the left lower field.  Equal chest expansion  Abdominal: Soft. Bowel sounds are normal. He exhibits no mass. There is no tenderness. There is no rebound and no guarding.  Musculoskeletal: Normal range of motion. He exhibits no edema ( 2+ pitting BLE).  Neurological: He is alert.  Speech is clear and goal oriented Moves extremities without ataxia  Skin: Skin is warm and dry. He is not  diaphoretic.  Psychiatric: He has a normal mood and affect.  Nursing note and vitals reviewed.    ED Treatments / Results  Labs (all  labs ordered are listed, but only abnormal results are displayed) Labs Reviewed  CBC WITH DIFFERENTIAL/PLATELET - Abnormal; Notable for the following:       Result Value   RBC 3.30 (*)    Hemoglobin 10.1 (*)    HCT 33.7 (*)    MCV 102.1 (*)    RDW 20.7 (*)    All other components within normal limits  COMPREHENSIVE METABOLIC PANEL - Abnormal; Notable for the following:    BUN 77 (*)    Creatinine, Ser 3.24 (*)    Calcium 8.8 (*)    GFR calc non Af Amer 16 (*)    GFR calc Af Amer 18 (*)    All other components within normal limits  PROTIME-INR - Abnormal; Notable for the following:    Prothrombin Time 27.5 (*)    All other components within normal limits  BRAIN NATRIURETIC PEPTIDE - Abnormal; Notable for the following:    B Natriuretic Peptide 920.1 (*)    All other components within normal limits  I-STAT TROPOININ, ED    EKG  EKG Interpretation  Date/Time:  Monday June 10 2016 01:17:22 EDT Ventricular Rate:  72 PR Interval:    QRS Duration: 131 QT Interval:  417 QTC Calculation: 466 R Axis:   27 Text Interpretation:  Atrial fibrillation Right bundle branch block Nonspecific T abnormalities, lateral leads In and out of RBBB Confirmed by HORTON  MD, COURTNEY ([id number]) on 06/10/2016 1:42:46 AM       Radiology Dg Chest 2 View  Result Date: 06/10/2016 CLINICAL DATA:  Dyspnea tonight.  Intermittent for several weeks. EXAM: CHEST  2 VIEW COMPARISON:  05/19/2016 FINDINGS: Moderate cardiomegaly, unchanged. Stable chronic elevation of the right hemidiaphragm. Mild linear basilar scarring or atelectasis, unchanged. No confluent airspace consolidation. No effusion. Normal pulmonary vasculature. IMPRESSION: Stable cardiomegaly. Mild linear atelectatic appearing lung base opacities. No consolidation or effusion. Electronically Signed   By: Andreas Newport M.D.   On: 06/10/2016 02:36    Procedures Procedures (including critical care time)  Medications Ordered in ED Medications  furosemide (LASIX) injection 60 mg (60 mg Intravenous Given 06/10/16 0415)     Initial Impression / Assessment and Plan / ED Course  I have reviewed the triage vital signs and the nursing notes.  Pertinent labs & imaging results that were available during my care of the patient were reviewed by me and considered in my medical decision making (see chart for details).     Patient with A. fib today. BNP is approximately 900. Mild anemia at 10.1. Patient's serum creatinine is 3.24, this appears to be baseline. Patient with worsening dyspnea on exertion and orthopnea. Question whether or not this is secondary to A. fib or CHF or some combination of 2.  Review shows that patient was in A. fib during his hospitalization and at discharge on 05/20/2016. He was rate controlled with amiodarone.  Patient scheduled for cardioversion on 06/11/2016.  Pt will need admission for diuresis and further a-fib control.    The patient was discussed with and seen by Dr. Dina Rich who agrees with the treatment plan.   Final Clinical Impressions(s) / ED Diagnoses   Final diagnoses:  SOB (shortness of breath)  Orthopnea  Dyspnea on exertion  Paroxysmal atrial fibrillation [facility])    New Prescriptions New Prescriptions   No medications on file     Abigail Butts, PA-C 06/10/16 [place], MD 06/13/16 [phone]

## 2016-06-11 ENCOUNTER — Encounter (HOSPITAL_COMMUNITY): Payer: Self-pay

## 2016-06-11 DIAGNOSIS — Z85828 Personal history of other malignant neoplasm of skin: Secondary | ICD-10-CM | POA: Diagnosis not present

## 2016-06-11 DIAGNOSIS — D638 Anemia in other chronic diseases classified elsewhere: Secondary | ICD-10-CM | POA: Diagnosis not present

## 2016-06-11 DIAGNOSIS — R0609 Other forms of dyspnea: Secondary | ICD-10-CM | POA: Diagnosis not present

## 2016-06-11 DIAGNOSIS — I5033 Acute on chronic diastolic (congestive) heart failure: Secondary | ICD-10-CM | POA: Diagnosis not present

## 2016-06-11 DIAGNOSIS — Z951 Presence of aortocoronary bypass graft: Secondary | ICD-10-CM | POA: Diagnosis not present

## 2016-06-11 DIAGNOSIS — Z880 Allergy status to penicillin: Secondary | ICD-10-CM | POA: Diagnosis not present

## 2016-06-11 DIAGNOSIS — E785 Hyperlipidemia, unspecified: Secondary | ICD-10-CM | POA: Diagnosis present

## 2016-06-11 DIAGNOSIS — Z6829 Body mass index (BMI) 29.0-29.9, adult: Secondary | ICD-10-CM | POA: Diagnosis not present

## 2016-06-11 DIAGNOSIS — I509 Heart failure, unspecified: Secondary | ICD-10-CM

## 2016-06-11 DIAGNOSIS — I5032 Chronic diastolic (congestive) heart failure: Secondary | ICD-10-CM | POA: Diagnosis not present

## 2016-06-11 DIAGNOSIS — R0602 Shortness of breath: Secondary | ICD-10-CM | POA: Diagnosis not present

## 2016-06-11 DIAGNOSIS — Z833 Family history of diabetes mellitus: Secondary | ICD-10-CM | POA: Diagnosis not present

## 2016-06-11 DIAGNOSIS — N186 End stage renal disease: Secondary | ICD-10-CM | POA: Diagnosis present

## 2016-06-11 DIAGNOSIS — E1121 Type 2 diabetes mellitus with diabetic nephropathy: Secondary | ICD-10-CM | POA: Diagnosis not present

## 2016-06-11 DIAGNOSIS — E1122 Type 2 diabetes mellitus with diabetic chronic kidney disease: Secondary | ICD-10-CM | POA: Diagnosis present

## 2016-06-11 DIAGNOSIS — N400 Enlarged prostate without lower urinary tract symptoms: Secondary | ICD-10-CM | POA: Diagnosis present

## 2016-06-11 DIAGNOSIS — I132 Hypertensive heart and chronic kidney disease with heart failure and with stage 5 chronic kidney disease, or end stage renal disease: Secondary | ICD-10-CM | POA: Diagnosis present

## 2016-06-11 DIAGNOSIS — Z7984 Long term (current) use of oral hypoglycemic drugs: Secondary | ICD-10-CM | POA: Diagnosis not present

## 2016-06-11 DIAGNOSIS — Z950 Presence of cardiac pacemaker: Secondary | ICD-10-CM | POA: Diagnosis not present

## 2016-06-11 DIAGNOSIS — E669 Obesity, unspecified: Secondary | ICD-10-CM | POA: Diagnosis present

## 2016-06-11 DIAGNOSIS — N185 Chronic kidney disease, stage 5: Secondary | ICD-10-CM | POA: Diagnosis not present

## 2016-06-11 DIAGNOSIS — Z885 Allergy status to narcotic agent status: Secondary | ICD-10-CM | POA: Diagnosis not present

## 2016-06-11 DIAGNOSIS — Z79899 Other long term (current) drug therapy: Secondary | ICD-10-CM | POA: Diagnosis not present

## 2016-06-11 DIAGNOSIS — I480 Paroxysmal atrial fibrillation: Secondary | ICD-10-CM | POA: Diagnosis not present

## 2016-06-11 DIAGNOSIS — R0601 Orthopnea: Secondary | ICD-10-CM | POA: Diagnosis not present

## 2016-06-11 DIAGNOSIS — R911 Solitary pulmonary nodule: Secondary | ICD-10-CM | POA: Diagnosis present

## 2016-06-11 DIAGNOSIS — Z8546 Personal history of malignant neoplasm of prostate: Secondary | ICD-10-CM | POA: Diagnosis not present

## 2016-06-11 DIAGNOSIS — Z888 Allergy status to other drugs, medicaments and biological substances status: Secondary | ICD-10-CM | POA: Diagnosis not present

## 2016-06-11 DIAGNOSIS — I120 Hypertensive chronic kidney disease with stage 5 chronic kidney disease or end stage renal disease: Secondary | ICD-10-CM | POA: Diagnosis not present

## 2016-06-11 DIAGNOSIS — Z794 Long term (current) use of insulin: Secondary | ICD-10-CM | POA: Diagnosis not present

## 2016-06-11 DIAGNOSIS — Z7901 Long term (current) use of anticoagulants: Secondary | ICD-10-CM | POA: Diagnosis not present

## 2016-06-11 DIAGNOSIS — I2510 Atherosclerotic heart disease of native coronary artery without angina pectoris: Secondary | ICD-10-CM | POA: Diagnosis present

## 2016-06-11 LAB — GLUCOSE, CAPILLARY
GLUCOSE-CAPILLARY: 122 mg/dL — AB (ref 65–99)
GLUCOSE-CAPILLARY: 152 mg/dL — AB (ref 65–99)
Glucose-Capillary: 135 mg/dL — ABNORMAL HIGH (ref 65–99)
Glucose-Capillary: 188 mg/dL — ABNORMAL HIGH (ref 65–99)

## 2016-06-11 LAB — CBC
HEMATOCRIT: 31.8 % — AB (ref 39.0–52.0)
Hemoglobin: 9.5 g/dL — ABNORMAL LOW (ref 13.0–17.0)
MCH: 30.5 pg (ref 26.0–34.0)
MCHC: 29.9 g/dL — ABNORMAL LOW (ref 30.0–36.0)
MCV: 102.3 fL — ABNORMAL HIGH (ref 78.0–100.0)
PLATELETS: 219 10*3/uL (ref 150–400)
RBC: 3.11 MIL/uL — ABNORMAL LOW (ref 4.22–5.81)
RDW: 20.2 % — AB (ref 11.5–15.5)
WBC: 7.4 10*3/uL (ref 4.0–10.5)

## 2016-06-11 LAB — BASIC METABOLIC PANEL
ANION GAP: 13 (ref 5–15)
BUN: 73 mg/dL — ABNORMAL HIGH (ref 6–20)
CALCIUM: 8.9 mg/dL (ref 8.9–10.3)
CO2: 29 mmol/L (ref 22–32)
CREATININE: 3.01 mg/dL — AB (ref 0.61–1.24)
Chloride: 100 mmol/L — ABNORMAL LOW (ref 101–111)
GFR calc Af Amer: 20 mL/min — ABNORMAL LOW (ref >60)
GFR, EST NON AFRICAN AMERICAN: 17 mL/min — AB (ref >60)
Glucose, Bld: 100 mg/dL — ABNORMAL HIGH (ref 65–99)
Potassium: 4.3 mmol/L (ref 3.5–5.1)
SODIUM: 142 mmol/L (ref 135–145)

## 2016-06-11 LAB — PROTIME-INR
INR: 2.35
PROTHROMBIN TIME: 26.2 s — AB (ref 11.4–15.2)

## 2016-06-11 MED ORDER — SODIUM CHLORIDE 0.45 % IV SOLN
INTRAVENOUS | Status: DC
  Administered 2016-06-12: 07:00:00 via INTRAVENOUS

## 2016-06-11 MED ORDER — WARFARIN SODIUM 4 MG PO TABS: 4.0000 mg | ORAL_TABLET | Freq: Every day | ORAL | Status: DC

## 2016-06-11 NOTE — Progress Notes (Addendum)
Subjective:  He had some mild shortness of breath last night but found out that the oxygen had been pulled off of the wall.  He is feeling better this morning with less shortness of breath.  Unable to schedule cardioversion today due to the lack of availability for anesthesia.  Objective:  Vital Signs in the last 24 hours: BP (!) 111/55 (BP Location: Left Arm)   Pulse 70   Temp 98 F (36.7 C) (Oral)   Resp 18   Ht 6\' 2"  (1.88 m)   Wt 106.5 kg (234 lb 12.6 oz)   SpO2 98%   BMI 30.15 kg/m   Physical Exam: Pleasant mildly obese male in no acute distress Lungs:  Mild crackles in bases Cardiac:  Irregular rhythm, normal S1 and S2, no S3 Abdomen:  Soft, nontender, no masses Extremities:  1+ edema present  Intake/Output from previous day: 04/02 0701 - 04/03 0700 In: 902 [P.O.:840; IV Piggyback:62] Out: 5550 [Urine:5550] Weight Filed Weights   06/10/16 0113 06/10/16 0542 06/11/16 0529  Weight: 106.6 kg (235 lb) 109.1 kg (240 lb 8 oz) 106.5 kg (234 lb 12.6 oz)    Lab Results: Basic Metabolic Panel:  Recent Labs  06/10/16 0917 06/11/16 0251  NA 141 142  K 5.2* 4.3  CL 107 100*  CO2 19* 29  GLUCOSE 99 100*  BUN 76* 73*  CREATININE 3.04* 3.01*    CBC:  Recent Labs  06/10/16 0203 06/11/16 0251  WBC 7.7 7.4  NEUTROABS 6.4  --   HGB 10.1* 9.5*  HCT 33.7* 31.8*  MCV 102.1* 102.3*  PLT 236 219    BNP    Component Value Date/Time   BNP 920.1 (H) 06/10/2016 0203    Cardiac Panel (last 3 results)  Recent Labs  06/10/16 0917 06/10/16 1409 06/10/16 2033  TROPONINI <0.03 <0.03 <0.03    PROTIME: Lab Results  Component Value Date   INR 2.35 06/11/2016   INR 2.50 06/10/2016   INR 1.26 05/20/2016    Telemetry: Paced rhythm underlying rhythm is atrial fibrillation  Assessment/Plan:  1.  Persistent atrial fibrillation 2.  Acute on chronic diastolic heart failure currently diuresing but still volume overloaded 3.  Long-term use of anticoagulation 4.   CAD with previous bypass grafting 5.  Stage IV to 5 chronic kidney disease  Recommendations:  Plan cardioversion tomorrow at 8:30.  Risks again discussed with patient.  His INR has been therapeutic for 3 weeks     W. Doristine Church  MD [facility] Cardiology  06/11/2016, 8:27 AM

## 2016-06-11 NOTE — Progress Notes (Signed)
PROGRESS NOTE   Christopher [name]  PFX:[id number] DOB: August 20, 1926 DOA: 06/10/2016  PCP: Tivis Ringer, MD   Subjective: Seen with his wife at bedside, denies any complaints this morning. Nonproductive cough  Brief Narrative:  CHALES [name] is a 83 y.o. male with a medical history significant of diastolic congestive heart failure NYHA II, atrial fibrillation on Warfarin, chronic kidney disease with end stage renal disease not on dialysis, diabetes mellitus type II, hypertension, coronary artery disease with CABG who presented to the emergency department on 06/10/16 for worsening orthopnea and DOE for 2-3 days. Patient was in Afib and was admitted to telemetry for diuresis and a scheduled cardioversion. Currently, patient reports shortness of breath upon exertion and lying down with significant improvement on a nasal cannula, and he has a persistent dry nonproductive cough that was relieved with Tessalon Perles yesterday. States leg swelling has significantly improved and reports weight loss of 4 lbs from 240 lbs yesterday to 236 lbs this morning. Patient denies chest pain, palpitations, leg pain, fever/chills, altered sensation.   Assessment & Plan:   Principal Problem:   Acute on chronic diastolic CHF (congestive heart failure), NYHA class 2 (HCC) Active Problems:   Paroxysmal atrial fibrillation (HCC)   CKD (chronic kidney disease), stage V (HCC)   DM type 2 causing renal disease (HCC)   Anemia of chronic disease   Chronic diastolic congestive heart failure, NYHA class 2 (HCC)   Exacerbation of diastolic CHF Continue Lasix 120 mg IV BID. Potassium at 4.3, continue to monitor. ECHO ordered for updated EF status since last ECHO was done in 2015.  Atrial fibrillation Patient is rate controlled at 70 bpm and INR within therapeutic limit of 2.35. Patient denies chest pain or palpitations. Dr. Wynonia Lawman was consulted and scheduled the patient for cardioversion tomorrow 06/12/16 at 8:30 AM. Patient  agreed to plan. Continue Metoprolol, Coumadin, Amiodarone.   End-stage renal disease, stage V Continue Lasix and monitor renal functioning. Creatinine is 3.01 and seems to be patient's baseline. GFR 16, BUN 77.   Diabetes mellitus type 2 Monitor CBG and continue to hold Amaryl and replace with Novolog while admitted.   Anemia of chronic disease Hgb stable at 9.5. Continue Aranesp injections and continue monitoring Hgb  DVT prophylaxis: Coumadin Family Communication: Wife at bedside and she was informed and agreed to patient's plan of care. Disposition Plan: telemetry. Remain admitted for scheduled cardioversion tomorrow at 8:30 am  Consultants:   Cardiology - Dr. Wynonia Lawman  Procedures:   ECHO  Antimicrobials:   None  Objective: Vitals:   06/10/16 1324 06/10/16 1330 06/10/16 2016 06/11/16 0529  BP: (!) 126/50 114/72 114/67 (!) 111/55  Pulse: (!) 56 70 76 70  Resp: 18 18 18 18   Temp: 98.4 F (36.9 C) 98.8 F (37.1 C) 98 F (36.7 C) 98 F (36.7 C)  TempSrc: Oral Oral Oral Oral  SpO2: 100% 100% 100% 98%  Weight:    106.5 kg (234 lb 12.6 oz)  Height:        Intake/Output Summary (Last 24 hours) at 06/11/16 1154 Last data filed at 06/11/16 1147  Gross per 24 hour  Intake              902 ml  Output             4850 ml  Net            -3948 ml   Filed Weights   06/10/16 0113 06/10/16 0542 06/11/16 3299  Weight: 106.6 kg (235 lb) 109.1 kg (240 lb 8 oz) 106.5 kg (234 lb 12.6 oz)    Examination:  General exam: Appears calm and comfortable  Respiratory system: Clear to auscultation. Faint rales over bilateral bases. Respiratory effort normal. No accessory muscle use. Able to speak in full sentences with intermittent pauses.  Cardiovascular system: S1 & S2 heard, RRR. No JVD, murmurs, rubs, gallops or clicks. 2+ pitting edema in bilateral LE. No calf tenderness. Gastrointestinal system: Abdomen is nondistended, soft and nontender. No organomegaly or masses felt. Normal  bowel sounds heard. Central nervous system: Alert and oriented. No focal neurological deficits. Extremities: Symmetric 5 x 5 power. Skin: No rashes, lesions or ulcers. Ecchymosis over bilateral upper and lower extremities.  Psychiatry: Judgement and insight appear normal. Mood & affect appropriate.   Data Reviewed: I have personally reviewed following labs and imaging studies  CBC:  Recent Labs Lab 06/10/16 0203 06/11/16 0251  WBC 7.7 7.4  NEUTROABS 6.4  --   HGB 10.1* 9.5*  HCT 33.7* 31.8*  MCV 102.1* 102.3*  PLT 236 767   Basic Metabolic Panel:  Recent Labs Lab 06/10/16 0203 06/10/16 0917 06/11/16 0251  NA 140 141 142  K 4.4 5.2* 4.3  CL 103 107 100*  CO2 25 19* 29  GLUCOSE 95 99 100*  BUN 77* 76* 73*  CREATININE 3.24* 3.04* 3.01*  CALCIUM 8.8* 8.6* 8.9   GFR: Estimated Creatinine Clearance: 22.5 mL/min (A) (by C-G formula based on SCr of 3.01 mg/dL (H)). Liver Function Tests:  Recent Labs Lab 06/10/16 0203  AST 31  ALT 23  ALKPHOS 53  BILITOT 1.2  PROT 6.6  ALBUMIN 3.5   Coagulation Profile:  Recent Labs Lab 06/10/16 0203 06/11/16 0251  INR 2.50 2.35   Cardiac Enzymes:  Recent Labs Lab 06/10/16 0917 06/10/16 1409 06/10/16 2033  TROPONINI <0.03 <0.03 <0.03   CBG:  Recent Labs Lab 06/10/16 1227 06/10/16 1626 06/10/16 2136 06/11/16 0633 06/11/16 1118  GLUCAP 117* 135* 136* 122* 135*    Radiology Studies: Dg Chest 2 View  Result Date: 06/10/2016 CLINICAL DATA:  Dyspnea tonight.  Intermittent for several weeks. EXAM: CHEST  2 VIEW COMPARISON:  05/19/2016 FINDINGS: Moderate cardiomegaly, unchanged. Stable chronic elevation of the right hemidiaphragm. Mild linear basilar scarring or atelectasis, unchanged. No confluent airspace consolidation. No effusion. Normal pulmonary vasculature. IMPRESSION: Stable cardiomegaly. Mild linear atelectatic appearing lung base opacities. No consolidation or effusion. Electronically Signed   By: Andreas Newport M.D.   On: 06/10/2016 02:36    Scheduled Meds: . amiodarone  200 mg Oral BID  . folic acid  209 mcg Oral Daily  . furosemide  120 mg Intravenous BID  . insulin aspart  0-9 Units Subcutaneous TID WC  . metoprolol succinate  25 mg Oral Daily  . omega-3 acid ethyl esters  1 g Oral BID  . polyethylene glycol  17 g Oral Daily  . sodium chloride flush  3 mL Intravenous Q12H  . warfarin  4 mg Oral q1800  . Warfarin - Pharmacist Dosing Inpatient   Does not apply q1800   Continuous Infusions: . [START ON 06/12/2016] sodium chloride       LOS: 0 days   Time spent: 35 minutes  Donald Siva, PA [employer] [facility] Pager [phone]  If 7PM-7AM, please contact night-coverage [email or web address] Password [email or web address] 06/11/2016, 11:54 AM    Birdie Hopes Pager: [phone] 06/11/2016, 2:27 PM

## 2016-06-11 NOTE — Progress Notes (Signed)
ANTICOAGULATION CONSULT NOTE - Follow Up Consult  Pharmacy Consult for Coumadin Indication: atrial fibrillation  Allergies  Allergen Reactions  . Crestor [Rosuvastatin] Other (See Comments)    "ACHING JOINTS"  . Lipitor [Atorvastatin] Other (See Comments)    "ACHING JOINTS"  . Statins Other (See Comments)    "ACHING JOINTS"  . Penicillins Rash and Other (See Comments)    Has patient had a PCN reaction causing immediate rash, facial/tongue/throat swelling, SOB or lightheadedness with hypotension: yes Has patient had a PCN reaction causing severe rash involving mucus membranes or skin necrosis: no Has patient had a PCN reaction that required hospitalization no Has patient had a PCN reaction occurring within the last 10 years: no If all of the above answers are "NO", then may proceed with Cephalosporin use.   . Tramadol Nausea Only    Patient Measurements: Height: 6\' 2"  (188 cm) Weight: 234 lb 12.6 oz (106.5 kg) IBW/kg (Calculated) : 82.2   Vital Signs: Temp: 98 F (36.7 C) (04/03 0529) Temp Source: Oral (04/03 0529) BP: 111/55 (04/03 0529) Pulse Rate: 70 (04/03 0529)  Labs:  Recent Labs  06/10/16 0203 06/10/16 0917 06/10/16 1409 06/10/16 2033 06/11/16 0251  HGB 10.1*  --   --   --  9.5*  HCT 33.7*  --   --   --  31.8*  PLT 236  --   --   --  219  LABPROT 27.5*  --   --   --  26.2*  INR 2.50  --   --   --  2.35  CREATININE 3.24* 3.04*  --   --  3.01*  TROPONINI  --  <0.03 <0.03 <0.03  --     Estimated Creatinine Clearance: 22.5 mL/min (A) (by C-G formula based on SCr of 3.01 mg/dL (H)).   Assessment: CC/HPI: 83 yo m presenting with SOB. Persistent afib.  PMH: CHF, PPM, CAD, afib on warfarin, CKD (not on HD)  Anticoag: Warfarin PTA for  afib. INR 2.35 today. Hgb dropped 10.1>9.5. Watch - PTA warfarin 4 mg daily - last dose 4/1. Admit INR 2.5  Goal of Therapy:  INR 2-3 Monitor platelets by anticoagulation protocol: Yes   Plan:  Continue Coumadin 4mg   daily. Daily INR Plan cardioversion   Chakita Mcgraw S. Alford Highland, PharmD, BCPS Clinical Staff Pharmacist Pager [phone]  Eilene Ghazi Stillinger 06/11/2016,1:45 PM

## 2016-06-12 ENCOUNTER — Inpatient Hospital Stay (HOSPITAL_COMMUNITY): Admission: RE | Admit: 2016-06-12 | Payer: Medicare Other | Source: Ambulatory Visit

## 2016-06-12 ENCOUNTER — Encounter (HOSPITAL_COMMUNITY): Payer: Self-pay | Admitting: Certified Registered Nurse Anesthetist

## 2016-06-12 ENCOUNTER — Encounter (HOSPITAL_COMMUNITY)
Admission: EM | Disposition: A | Discharge status: Home Health | Payer: Self-pay | Source: Home / Self Care | Attending: Internal Medicine

## 2016-06-12 ENCOUNTER — Other Ambulatory Visit (HOSPITAL_COMMUNITY): Payer: Medicare Other

## 2016-06-12 ENCOUNTER — Inpatient Hospital Stay (HOSPITAL_COMMUNITY): Payer: Medicare Other | Admitting: Anesthesiology

## 2016-06-12 DIAGNOSIS — I5033 Acute on chronic diastolic (congestive) heart failure: Secondary | ICD-10-CM

## 2016-06-12 HISTORY — PX: CARDIOVERSION: SHX1299

## 2016-06-12 LAB — GLUCOSE, CAPILLARY
GLUCOSE-CAPILLARY: 108 mg/dL — AB (ref 65–99)
GLUCOSE-CAPILLARY: 131 mg/dL — AB (ref 65–99)
GLUCOSE-CAPILLARY: 147 mg/dL — AB (ref 65–99)
Glucose-Capillary: 132 mg/dL — ABNORMAL HIGH (ref 65–99)

## 2016-06-12 LAB — BASIC METABOLIC PANEL
ANION GAP: 8 (ref 5–15)
BUN: 79 mg/dL — ABNORMAL HIGH (ref 6–20)
CO2: 31 mmol/L (ref 22–32)
Calcium: 8.8 mg/dL — ABNORMAL LOW (ref 8.9–10.3)
Chloride: 102 mmol/L (ref 101–111)
Creatinine, Ser: 3.25 mg/dL — ABNORMAL HIGH (ref 0.61–1.24)
GFR calc Af Amer: 18 mL/min — ABNORMAL LOW (ref >60)
GFR, EST NON AFRICAN AMERICAN: 16 mL/min — AB (ref >60)
Glucose, Bld: 119 mg/dL — ABNORMAL HIGH (ref 65–99)
POTASSIUM: 4.9 mmol/L (ref 3.5–5.1)
SODIUM: 141 mmol/L (ref 135–145)

## 2016-06-12 LAB — PROTIME-INR
INR: 2.6
PROTHROMBIN TIME: 28.3 s — AB (ref 11.4–15.2)

## 2016-06-12 SURGERY — CARDIOVERSION
Anesthesia: General

## 2016-06-12 MED ORDER — HYDROCORTISONE 1 % EX CREA
1.0000 "application " | TOPICAL_CREAM | Freq: Three times a day (TID) | CUTANEOUS | Status: DC | PRN
  Filled 2016-06-12: qty 28

## 2016-06-12 MED ORDER — LIDOCAINE 2% (20 MG/ML) 5 ML SYRINGE
INTRAMUSCULAR | Status: DC | PRN
  Administered 2016-06-12: 60 mg via INTRAVENOUS

## 2016-06-12 MED ORDER — PROPOFOL 10 MG/ML IV BOLUS
INTRAVENOUS | Status: DC | PRN
  Administered 2016-06-12: 50 mg via INTRAVENOUS

## 2016-06-12 MED ORDER — SODIUM CHLORIDE 0.9 % IV SOLN
INTRAVENOUS | Status: DC | PRN
  Administered 2016-06-12: 09:00:00 via INTRAVENOUS

## 2016-06-12 NOTE — Anesthesia Preprocedure Evaluation (Signed)
Anesthesia Evaluation  Patient identified by MRN, date of birth, ID band Patient awake    Reviewed: Allergy & Precautions, NPO status , Patient's Chart, lab work & pertinent test results, reviewed documented beta blocker date and time   Airway Mallampati: II       Dental  (+) Dental Advisory Given, Teeth Intact   Pulmonary former smoker,    breath sounds clear to auscultation       Cardiovascular hypertension, Pt. on home beta blockers + CAD, + CABG and +CHF  + dysrhythmias Atrial Fibrillation + pacemaker  Rhythm:Irregular Rate:Abnormal     Neuro/Psych negative neurological ROS  negative psych ROS   GI/Hepatic negative GI ROS, Neg liver ROS,   Endo/Other  diabetes, Type 2, Oral Hypoglycemic AgentsObesity   Renal/GU Renal Insufficiency and ESRFRenal disease   Prostate cancer     Musculoskeletal negative musculoskeletal ROS (+)   Abdominal   Peds negative pediatric ROS (+)  Hematology  (+) Blood dyscrasia (Warfarin therapy), ,   Anesthesia Other Findings   Reproductive/Obstetrics negative OB ROS                            Anesthesia Physical  Anesthesia Plan  ASA: III  Anesthesia Plan: General   Post-op Pain Management:    Induction: Intravenous  Airway Management Planned: Natural Airway  Additional Equipment:   Intra-op Plan:   Post-operative Plan:   Informed Consent: I have reviewed the patients History and Physical, chart, labs and discussed the procedure including the risks, benefits and alternatives for the proposed anesthesia with the patient or authorized representative who has indicated his/her understanding and acceptance.     Plan Discussed with: CRNA  Anesthesia Plan Comments:         Anesthesia Quick Evaluation

## 2016-06-12 NOTE — H&P (Signed)
Patient stable for procedue and H&P updated.  No change in clinical condition since yesterday.  For Cardioversion.  Kerry Hough. MD [facility] 06/12/2016 8:38 AM

## 2016-06-12 NOTE — Anesthesia Procedure Notes (Signed)
Date/Time: 06/12/2016 7:45 AM Performed by: Willeen Cass P Pre-anesthesia Checklist: Patient identified, Emergency Drugs available, Suction available, Patient being monitored and Timeout performed Patient Re-evaluated:Patient Re-evaluated prior to inductionOxygen Delivery Method: Ambu bag Preoxygenation: Pre-oxygenation with 100% oxygen Intubation Type: IV induction Ventilation: Mask ventilation without difficulty Dental Injury: Teeth and Oropharynx as per pre-operative assessment

## 2016-06-12 NOTE — Transfer of Care (Signed)
Immediate Anesthesia Transfer of Care Note  Patient: Christopher [name]  Procedure(s) Performed: Procedure(s): CARDIOVERSION (N/A)  Patient Location: PACU and Endoscopy Unit  Anesthesia Type:General  Level of Consciousness: awake, patient cooperative and lethargic  Airway & Oxygen Therapy: Patient Spontanous Breathing and Patient connected to nasal cannula oxygen  Post-op Assessment: Report given to RN, Post -op Vital signs reviewed and stable and Patient moving all extremities  Post vital signs: Reviewed and stable  Last Vitals:  Vitals:   06/12/16 0500 06/12/16 0804  BP: (!) 109/58 (!) 124/53  Pulse: 80 71  Resp: 18 19  Temp: 36.4 C     Last Pain:  Vitals:   06/12/16 0804  TempSrc: Oral  PainSc:          Complications: No apparent anesthesia complications

## 2016-06-12 NOTE — Progress Notes (Signed)
ANTICOAGULATION CONSULT NOTE - Follow Up Consult  Pharmacy Consult for Coumadin Indication: atrial fibrillation  Allergies  Allergen Reactions  . Crestor [Rosuvastatin] Other (See Comments)    "ACHING JOINTS"  . Lipitor [Atorvastatin] Other (See Comments)    "ACHING JOINTS"  . Statins Other (See Comments)    "ACHING JOINTS"  . Penicillins Rash and Other (See Comments)    Has patient had a PCN reaction causing immediate rash, facial/tongue/throat swelling, SOB or lightheadedness with hypotension: yes Has patient had a PCN reaction causing severe rash involving mucus membranes or skin necrosis: no Has patient had a PCN reaction that required hospitalization no Has patient had a PCN reaction occurring within the last 10 years: no If all of the above answers are "NO", then may proceed with Cephalosporin use.   . Tramadol Nausea Only    Patient Measurements: Height: 6\' 2"  (188 cm) Weight: 234 lb 12.6 oz (106.5 kg) IBW/kg (Calculated) : 82.2   Vital Signs: Temp: 97.7 F (36.5 C) (04/04 0859) Temp Source: Oral (04/04 0859) BP: 128/72 (04/04 1027) Pulse Rate: 81 (04/04 1027)  Labs:  Recent Labs  06/10/16 0203 06/10/16 0917 06/10/16 1409 06/10/16 2033 06/11/16 0251 06/12/16 0330  HGB 10.1*  --   --   --  9.5*  --   HCT 33.7*  --   --   --  31.8*  --   PLT 236  --   --   --  219  --   LABPROT 27.5*  --   --   --  26.2* 28.3*  INR 2.50  --   --   --  2.35 2.60  CREATININE 3.24* 3.04*  --   --  3.01* 3.25*  TROPONINI  --  <0.03 <0.03 <0.03  --   --     Estimated Creatinine Clearance: 20.8 mL/min (A) (by C-G formula based on SCr of 3.25 mg/dL (H)).   Assessment: CC/HPI: 83 yo m presenting with SOB. Persistent afib.  PMH: CHF, PPM, CAD, afib on warfarin, CKD (not on HD)   Anticoag: Warfarin PTA for  afib. INR 2.6 today. Hgb dropped 10.1>9.5. Watch. Extensive subcutaneous bruising/bleeding up entire L forearm from IV sticks since 4/1. - PTA warfarin 4 mg daily - last  dose 4/1. Admit INR 2.5  Goal of Therapy:  INR 2-3 Monitor platelets by anticoagulation protocol: Yes   Plan:  Continue Coumadin 4mg  daily. Daily INR Plan cardioversion today   Ellyanna Holton S. Alford Highland, PharmD, BCPS Clinical Staff Pharmacist Pager [place] [place] 06/12/2016,10:42 AM

## 2016-06-12 NOTE — Progress Notes (Signed)
Progress Note    Christopher [name]  EUM:[id number] DOB: November 21, 1926  DOA: 06/10/2016 PCP: Tivis Ringer, MD    Brief Narrative:   Chief complaint: Follow-up atrial fibrillation  Medical records reviewed and are as summarized below:  Christopher [name] is an 83 y.o. male with a PMH of diastolic CHF and atrial fibrillation on chronic Coumadin who was admitted 06/10/16 with chief complaint of worsening dyspnea. Noted to be in atrial fibrillation on admission with plans for cardioversion.  Assessment/Plan:   Principal problem:  Acute on chronic diastolic CHF Remains on high-dose Lasix 120 mg IV BID. Potassium at 4.9, continue to monitor. ECHO ordered for updated EF status since last ECHO was done in 2015. Results pending. I/O- 1.2 L.  Active problems:  Atrial fibrillation Patient is rate controlled with heart rate 70s-80s and INR remains therapeutic. Patient denies chest pain or palpitations. Dr. Wynonia Lawman was consulted and scheduled the patient for cardioversion which was done this morning and was successful. Continue Metoprolol, Coumadin, Amiodarone.   End-stage renal disease, stage V Continue Lasix and monitor renal functioning. Creatinine is 3.25 slightly above baseline. We'll continue to monitor closely.   Diabetes mellitus type 2 Amaryl remains on hold. Continue insulin sensitive SSI. CBGs on 108-188.  Anemia of chronic disease Hgb stable at 9.5. Continue Aranesp injections and continue monitoring Hgb  Family Communication/Anticipated D/C date and plan/Code Status   DVT prophylaxis: Warfarin ordered. Code Status: Full Code.  Family Communication: Wife at the bedside. Disposition Plan: Home when stable.   Medical Consultants:    None.   Procedures:    None  Anti-Infectives:    None  Subjective:   "I feel great right now".  No chest pain or dyspnea s/p DCCV.   Objective:    Vitals:   06/11/16 1300 06/11/16 2110 06/12/16 0500 06/12/16 0804  BP: 115/68  (!) 102/49 (!) 109/58 (!) 124/53  Pulse: 71 75 80 71  Resp: 18 18 18 19   Temp: 98.1 F (36.7 C) 98.2 F (36.8 C) 97.6 F (36.4 C)   TempSrc: Oral Oral Oral Oral  SpO2: 99% 99% 98% 96%  Weight:   106.5 kg (234 lb 12.6 oz)   Height:        Intake/Output Summary (Last 24 hours) at 06/12/16 0821 Last data filed at 06/12/16 0222  Gross per 24 hour  Intake              480 ml  Output             1750 ml  Net            -1270 ml   Filed Weights   06/10/16 0542 06/11/16 0529 06/12/16 0500  Weight: 109.1 kg (240 lb 8 oz) 106.5 kg (234 lb 12.6 oz) 106.5 kg (234 lb 12.6 oz)    Exam: General exam: Appears calm and comfortable.  Respiratory system: Clear to auscultation. Respiratory effort normal. Cardiovascular system: S1 & S2 heard, RRR. No JVD,  rubs, gallops or clicks. No murmurs. Gastrointestinal system: Abdomen is nondistended, soft and nontender. No organomegaly or masses felt. Normal bowel sounds heard. Central nervous system: Alert and oriented. No focal neurological deficits. Extremities: No clubbing,  or cyanosis. Trace edema. Skin: No rashes, lesions or ulcers. Psychiatry: Judgement and insight appear normal. Mood & affect appropriate.   Data Reviewed:   I have personally reviewed following labs and imaging studies:  Labs: Basic Metabolic Panel:  Recent Labs Lab 06/10/16 0203 06/10/16 5400  06/11/16 0251 06/12/16 0330  NA 140 141 142 141  K 4.4 5.2* 4.3 4.9  CL 103 107 100* 102  CO2 25 19* 29 31  GLUCOSE 95 99 100* 119*  BUN 77* 76* 73* 79*  CREATININE 3.24* 3.04* 3.01* 3.25*  CALCIUM 8.8* 8.6* 8.9 8.8*   GFR Estimated Creatinine Clearance: 20.8 mL/min (A) (by C-G formula based on SCr of 3.25 mg/dL (H)). Liver Function Tests:  Recent Labs Lab 06/10/16 0203  AST 31  ALT 23  ALKPHOS 53  BILITOT 1.2  PROT 6.6  ALBUMIN 3.5   No results for input(s): LIPASE, AMYLASE in the last 168 hours. No results for input(s): AMMONIA in the last 168  hours. Coagulation profile  Recent Labs Lab 06/10/16 0203 06/11/16 0251 06/12/16 0330  INR 2.50 2.35 2.60    CBC:  Recent Labs Lab 06/10/16 0203 06/11/16 0251  WBC 7.7 7.4  NEUTROABS 6.4  --   HGB 10.1* 9.5*  HCT 33.7* 31.8*  MCV 102.1* 102.3*  PLT 236 219   Cardiac Enzymes:  Recent Labs Lab 06/10/16 0917 06/10/16 1409 06/10/16 2033  TROPONINI <0.03 <0.03 <0.03   BNP (last 3 results) No results for input(s): PROBNP in the last 8760 hours. CBG:  Recent Labs Lab 06/11/16 0633 06/11/16 1118 06/11/16 1626 06/11/16 2109 06/12/16 0625  GLUCAP 122* 135* 188* 152* 108*   D-Dimer: No results for input(s): DDIMER in the last 72 hours. Hgb A1c: No results for input(s): HGBA1C in the last 72 hours. Lipid Profile: No results for input(s): CHOL, HDL, LDLCALC, TRIG, CHOLHDL, LDLDIRECT in the last 72 hours. Thyroid function studies: No results for input(s): TSH, T4TOTAL, T3FREE, THYROIDAB in the last 72 hours.  Invalid input(s): FREET3 Anemia work up: No results for input(s): VITAMINB12, FOLATE, FERRITIN, TIBC, IRON, RETICCTPCT in the last 72 hours. Sepsis Labs:  Recent Labs Lab 06/10/16 0203 06/11/16 0251  WBC 7.7 7.4    Microbiology No results found for this or any previous visit (from the past 240 hour(s)).  Radiology: No results found.  Medications:   . [MAR Hold] amiodarone  200 mg Oral BID  . [MAR Hold] folic acid  121 mcg Oral Daily  . [MAR Hold] furosemide  120 mg Intravenous BID  . [MAR Hold] insulin aspart  0-9 Units Subcutaneous TID WC  . [MAR Hold] metoprolol succinate  25 mg Oral Daily  . [MAR Hold] omega-3 acid ethyl esters  1 g Oral BID  . [MAR Hold] polyethylene glycol  17 g Oral Daily  . [MAR Hold] sodium chloride flush  3 mL Intravenous Q12H  . [MAR Hold] warfarin  4 mg Oral q1800  . [MAR Hold] Warfarin - Pharmacist Dosing Inpatient   Does not apply q1800   Continuous Infusions: . sodium chloride 10 mL/hr at 06/12/16 0648     Medical decision making is moderately complex therefore this is a level 2 visit. (> 3 problem points, 2 data points, moderate risk)    LOS: 1 day   Christopher [name]  [facility] Pager [phone]. If unable to reach me by pager, please call my cell phone at [phone].  *Please refer to [email or web address], password [email or web address] to get updated schedule on who will round on this patient, as hospitalists switch teams weekly. If 7PM-7AM, please contact night-coverage at [email or web address], password [email or web address] for any overnight needs.  06/12/2016, 8:21 AM

## 2016-06-12 NOTE — Anesthesia Postprocedure Evaluation (Signed)
Anesthesia Post Note  Patient: Christopher [name]  Procedure(s) Performed: Procedure(s) (LRB): CARDIOVERSION (N/A)  Patient location during evaluation: Endoscopy Anesthesia Type: General Level of consciousness: awake and alert Pain management: pain level controlled Vital Signs Assessment: post-procedure vital signs reviewed and stable Respiratory status: spontaneous breathing, nonlabored ventilation, respiratory function stable and patient connected to nasal cannula oxygen Cardiovascular status: stable and blood pressure returned to baseline Anesthetic complications: no       Last Vitals:  Vitals:   06/12/16 0934 06/12/16 1027  BP: (!) 114/58 128/72  Pulse: 75 81  Resp: 19   Temp:      Last Pain:  Vitals:   06/12/16 0859  TempSrc: Oral  PainSc:                  Catalina Gravel

## 2016-06-13 ENCOUNTER — Other Ambulatory Visit (HOSPITAL_COMMUNITY): Payer: Medicare Other

## 2016-06-13 ENCOUNTER — Encounter (HOSPITAL_COMMUNITY): Payer: Self-pay | Admitting: Cardiology

## 2016-06-13 DIAGNOSIS — N185 Chronic kidney disease, stage 5: Secondary | ICD-10-CM

## 2016-06-13 DIAGNOSIS — R0601 Orthopnea: Secondary | ICD-10-CM

## 2016-06-13 DIAGNOSIS — I5032 Chronic diastolic (congestive) heart failure: Secondary | ICD-10-CM

## 2016-06-13 DIAGNOSIS — Z794 Long term (current) use of insulin: Secondary | ICD-10-CM

## 2016-06-13 DIAGNOSIS — D638 Anemia in other chronic diseases classified elsewhere: Secondary | ICD-10-CM

## 2016-06-13 DIAGNOSIS — R0609 Other forms of dyspnea: Secondary | ICD-10-CM

## 2016-06-13 DIAGNOSIS — I480 Paroxysmal atrial fibrillation: Principal | ICD-10-CM

## 2016-06-13 DIAGNOSIS — E1121 Type 2 diabetes mellitus with diabetic nephropathy: Secondary | ICD-10-CM

## 2016-06-13 LAB — PROTIME-INR
INR: 2.52
PROTHROMBIN TIME: 27.7 s — AB (ref 11.4–15.2)

## 2016-06-13 LAB — BASIC METABOLIC PANEL
Anion gap: 10 (ref 5–15)
BUN: 76 mg/dL — ABNORMAL HIGH (ref 6–20)
CALCIUM: 8.6 mg/dL — AB (ref 8.9–10.3)
CO2: 30 mmol/L (ref 22–32)
CREATININE: 3.07 mg/dL — AB (ref 0.61–1.24)
Chloride: 99 mmol/L — ABNORMAL LOW (ref 101–111)
GFR, EST AFRICAN AMERICAN: 20 mL/min — AB (ref >60)
GFR, EST NON AFRICAN AMERICAN: 17 mL/min — AB (ref >60)
Glucose, Bld: 98 mg/dL (ref 65–99)
Potassium: 4.2 mmol/L (ref 3.5–5.1)
SODIUM: 139 mmol/L (ref 135–145)

## 2016-06-13 LAB — GLUCOSE, CAPILLARY: GLUCOSE-CAPILLARY: 115 mg/dL — AB (ref 65–99)

## 2016-06-13 MED ORDER — FUROSEMIDE 80 MG PO TABS: 80.0000 mg | ORAL_TABLET | Freq: Two times a day (BID) | ORAL | Status: DC

## 2016-06-13 MED ORDER — AMIODARONE HCL 200 MG PO TABS
200.0000 mg | ORAL_TABLET | Freq: Two times a day (BID) | ORAL | Status: DC
Start: 2016-06-13 — End: 2016-10-17

## 2016-06-13 NOTE — Progress Notes (Signed)
ANTICOAGULATION CONSULT NOTE - Follow Up Consult  Pharmacy Consult for Coumadin Indication: atrial fibrillation  Allergies  Allergen Reactions  . Crestor [Rosuvastatin] Other (See Comments)    "ACHING JOINTS"  . Lipitor [Atorvastatin] Other (See Comments)    "ACHING JOINTS"  . Statins Other (See Comments)    "ACHING JOINTS"  . Penicillins Rash and Other (See Comments)    Has patient had a PCN reaction causing immediate rash, facial/tongue/throat swelling, SOB or lightheadedness with hypotension: yes Has patient had a PCN reaction causing severe rash involving mucus membranes or skin necrosis: no Has patient had a PCN reaction that required hospitalization no Has patient had a PCN reaction occurring within the last 10 years: no If all of the above answers are "NO", then may proceed with Cephalosporin use.   . Tramadol Nausea Only    Patient Measurements: Height: 6\' 2"  (188 cm) Weight: 233 lb 3.2 oz (105.8 kg) IBW/kg (Calculated) : 82.2   Vital Signs: Temp: 98.4 F (36.9 C) (04/05 0439) Temp Source: Oral (04/05 0439) BP: 124/57 (04/05 0936) Pulse Rate: 74 (04/05 0936)  Labs:  Recent Labs  06/10/16 1409 06/10/16 2033 06/11/16 0251 06/12/16 0330 06/13/16 0323  HGB  --   --  9.5*  --   --   HCT  --   --  31.8*  --   --   PLT  --   --  219  --   --   LABPROT  --   --  26.2* 28.3* 27.7*  INR  --   --  2.35 2.60 2.52  CREATININE  --   --  3.01* 3.25* 3.07*  TROPONINI <0.03 <0.03  --   --   --     Estimated Creatinine Clearance: 22 mL/min (A) (by C-G formula based on SCr of 3.07 mg/dL (H)).   Assessment: CC/HPI: 83 yo m presenting with SOB. Persistent afib.  PMH: CHF, PPM, CAD, afib on warfarin, CKD (not on HD)  Anticoag: Warfarin PTA for  afib. INR 2.52 today. Hgb dropped 10.1>9.5. Watch. Extensive subcutaneous bruising/bleeding up entire L forearm from IV sticks since 4/1. - PTA warfarin 4 mg daily - last dose 4/1. Admit INR 2.5  Goal of Therapy:  INR  2-3 Monitor platelets by anticoagulation protocol: Yes   Plan:  Continue Coumadin 4mg  daily. Daily INR   Gerri Acre S. Alford Highland, PharmD, BCPS Clinical Staff Pharmacist Pager [phone]  Eilene Ghazi Stillinger 06/13/2016,10:25 AM

## 2016-06-13 NOTE — Progress Notes (Signed)
Pt to be discharged home with wife. IV and telemetry box removed. Pt and pt's wife received discharge instructions and all questions were answered. Volunteer services have been called to take pt off unit for discharge.   Grant Fontana BSN, RN

## 2016-06-13 NOTE — CV Procedure (Signed)
Electrical Cardioversion Procedure Note  Christopher [name]   83 y.o. male MRN: [id number] DOB: [birth year]  Today's date: 06/12/2016  Procedure: Electrical Cardioversion  Indications:  Atrial Fibrillation  Time Out: Verified patient identification, verified procedure,medications/allergies/relevent history reviewed, required imaging and test results available.  Performed  Procedure Details  The patient was NPO after midnight. Anesthesia was administered at the beside  by Dr.Steven Gifford Shave with 60 mg of lidocaine and 50 mg of propofol.  Cardioversion was done with synchronized biphasic defibrillation with AP pads with 120 watts and then escalated to 200 watts.  The patient converted to normal sinus rhythm after the second shock. The patient tolerated the procedure well   IMPRESSION:  Successful cardioversion of atrial fibrillation    W. Tollie Eth, Brooke Bonito. MD [facility]   06/12/2016, 8:45 AM

## 2016-06-13 NOTE — Discharge Summary (Signed)
Physician Discharge Summary  Christopher [name] VFI:[id number] DOB: [birth year] DOA: 06/10/2016  PCP: Tivis Ringer, MD  Admit date: 06/10/2016 Discharge date: 06/13/2016  Admitted From: Home Discharge disposition: Home   Recommendations for Outpatient Follow-Up:   1. Needs close follow-up of creatinine and electrolytes, discharged on high dose Lasix.   Discharge Diagnosis:   Principal Problem:   Acute on chronic diastolic CHF (congestive heart failure), NYHA class 2 (HCC) Active Problems:   Paroxysmal atrial fibrillation (HCC)   CKD (chronic kidney disease), stage V (HCC)   DM type 2 causing renal disease (HCC)   Anemia of chronic disease   Chronic diastolic congestive heart failure, NYHA class 2 (HCC)   CHF (congestive heart failure) ([place])  Discharge Condition: Improved.  Diet recommendation: Low sodium, heart healthy.  Carbohydrate-modified.    History of Present Illness:   Christopher [name] is an 83 y.o. male with a PMH of diastolic CHF and atrial fibrillation on chronic Coumadin who was admitted 06/10/16 with chief complaint of worsening dyspnea. Noted to be in atrial fibrillation on admission with plans for cardioversion.  Hospital Course by Problem:   Principal problem:  Acute on chronic diastolic CHF Diuresed with high-dose Lasix 120 mg IV BID, discharged on Lasix 120 mg in the morning and 80 mg in the evening. Potassium at 4.2, continue to monitor. No need to repeat echo, which was done by Dr. Wynonia Lawman as an outpatient recently.  Active problems:  Atrial fibrillation Patient is rate controlled with heart rate 70s-80s and INR remains therapeutic. Patient denies chest pain or palpitations. Dr. Wynonia Lawman was consulted patient is s/p cardioversion 06/12/16 with return of rhythm to normal sinus. Continue Metoprolol, Coumadin, Amiodarone.   End-stage renal disease, stage V Continue Lasix and monitor renal functioning. Creatinine is 3.07 slightly above baseline. Needs close  outpatient follow-up.  Diabetes mellitus type 2 Treated with insulin sensitive SSI. CBGs on 108-147. Okay to resume Amaryl at discharge.  Anemia of chronic disease Hgb stable. Continue Aranesp.  Medical Consultants:    Cardiology   Discharge Exam:   Vitals:   06/13/16 0439 06/13/16 0936  BP: (!) 114/51 (!) 124/57  Pulse: 72 74  Resp: 16   Temp: 98.4 F (36.9 C)    Vitals:   06/12/16 1948 06/13/16 0439 06/13/16 0440 06/13/16 0936  BP: 117/62 (!) 114/51  (!) 124/57  Pulse: 70 72  74  Resp: 18 16    Temp: 98.1 F (36.7 C) 98.4 F (36.9 C)    TempSrc: Oral Oral    SpO2: 93% 97%    Weight:   105.8 kg (233 lb 3.2 oz)   Height:        General exam: Appears calm and comfortable.  Respiratory system: Clear to auscultation. Respiratory effort normal. Cardiovascular system: S1 & S2 heard, RRR. No JVD,  rubs, gallops or clicks. No murmurs. Gastrointestinal system: Abdomen is nondistended, soft and nontender. No organomegaly or masses felt. Normal bowel sounds heard. Central nervous system: Alert and oriented. No focal neurological deficits. Extremities: No clubbing,  or cyanosis. 1-2+ edema. Skin: No rashes, lesions or ulcers. Psychiatry: Judgement and insight appear normal. Mood & affect appropriate.    The results of significant diagnostics from this hospitalization (including imaging, microbiology, ancillary and laboratory) are listed below for reference.     Procedures and Diagnostic Studies:   Dg Chest 2 View  Result Date: 06/10/2016 CLINICAL DATA:  Dyspnea tonight.  Intermittent for several weeks. EXAM: CHEST  2 VIEW COMPARISON:  05/19/2016 FINDINGS: Moderate cardiomegaly, unchanged. Stable chronic elevation of the right hemidiaphragm. Mild linear basilar scarring or atelectasis, unchanged. No confluent airspace consolidation. No effusion. Normal pulmonary vasculature. IMPRESSION: Stable cardiomegaly. Mild linear atelectatic appearing lung base opacities. No  consolidation or effusion. Electronically Signed   By: Andreas Newport M.D.   On: 06/10/2016 02:36     Labs:   Basic Metabolic Panel:  Recent Labs Lab 06/10/16 0203 06/10/16 0917 06/11/16 0251 06/12/16 0330 06/13/16 0323  NA 140 141 142 141 139  K 4.4 5.2* 4.3 4.9 4.2  CL 103 107 100* 102 99*  CO2 25 19* 29 31 30   GLUCOSE 95 99 100* 119* 98  BUN 77* 76* 73* 79* 76*  CREATININE 3.24* 3.04* 3.01* 3.25* 3.07*  CALCIUM 8.8* 8.6* 8.9 8.8* 8.6*   GFR Estimated Creatinine Clearance: 22 mL/min (A) (by C-G formula based on SCr of 3.07 mg/dL (H)). Liver Function Tests:  Recent Labs Lab 06/10/16 0203  AST 31  ALT 23  ALKPHOS 53  BILITOT 1.2  PROT 6.6  ALBUMIN 3.5   No results for input(s): LIPASE, AMYLASE in the last 168 hours. No results for input(s): AMMONIA in the last 168 hours. Coagulation profile  Recent Labs Lab 06/10/16 0203 06/11/16 0251 06/12/16 0330 06/13/16 0323  INR 2.50 2.35 2.60 2.52    CBC:  Recent Labs Lab 06/10/16 0203 06/11/16 0251  WBC 7.7 7.4  NEUTROABS 6.4  --   HGB 10.1* 9.5*  HCT 33.7* 31.8*  MCV 102.1* 102.3*  PLT 236 219   Cardiac Enzymes:  Recent Labs Lab 06/10/16 0917 06/10/16 1409 06/10/16 2033  TROPONINI <0.03 <0.03 <0.03   CBG:  Recent Labs Lab 06/12/16 0625 06/12/16 1134 06/12/16 1656 06/12/16 2037 06/13/16 0623  GLUCAP 108* 131* 132* 147* 115*     Discharge Instructions:   Discharge Instructions    (HEART FAILURE PATIENTS) Call MD:  Anytime you have any of the following symptoms: 1) 3 pound weight gain in 24 hours or 5 pounds in 1 week 2) shortness of breath, with or without a dry hacking cough 3) swelling in the hands, feet or stomach 4) if you have to sleep on extra pillows at night in order to breathe.    Complete by:  As directed    Call MD for:  extreme fatigue    Complete by:  As directed    Call MD for:  persistant dizziness or light-headedness    Complete by:  As directed    Diet - low  sodium heart healthy    Complete by:  As directed    Diet Carb Modified    Complete by:  As directed    Increase activity slowly    Complete by:  As directed      Allergies as of 06/13/2016      Reactions   Crestor [rosuvastatin] Other (See Comments)   "ACHING JOINTS"   Lipitor [atorvastatin] Other (See Comments)   "ACHING JOINTS"   Statins Other (See Comments)   "ACHING JOINTS"   Penicillins Rash, Other (See Comments)   Has patient had a PCN reaction causing immediate rash, facial/tongue/throat swelling, SOB or lightheadedness with hypotension: yes Has patient had a PCN reaction causing severe rash involving mucus membranes or skin necrosis: no Has patient had a PCN reaction that required hospitalization no Has patient had a PCN reaction occurring within the last 10 years: no If all of the above answers are "NO", then may proceed with Cephalosporin use.  Tramadol Nausea Only      Medication List    TAKE these medications   amiodarone 200 MG tablet Commonly known as:  PACERONE Take 1 tablet (200 mg total) by mouth 2 (two) times daily. What changed:  when to take this   carboxymethylcellulose 0.5 % Soln Commonly known as:  REFRESH PLUS Place 1 drop into both eyes 3 (three) times daily as needed (for dry eyes.).   Darbepoetin Alfa 100 MCG/0.5ML Sosy injection Commonly known as:  ARANESP Inject 100 mcg into the skin every 21 ( twenty-one) days.   Fish Oil 1200 MG Caps Take 1,200 mg by mouth 2 (two) times daily.   folic acid 370 MCG tablet Commonly known as:  FOLVITE Take 400 mcg by mouth daily.   furosemide 80 MG tablet Commonly known as:  LASIX Take 1-2 tablets (80-160 mg total) by mouth 2 (two) times daily. Take 160 mg in the morning and 80 mg in the evening. What changed:  how much to take  additional instructions   glimepiride 1 MG tablet Commonly known as:  AMARYL Take 1 tablet (1 mg total) by mouth daily before breakfast.   Glucosamine HCl 1500 MG  Tabs Take 1,500 mg by mouth 2 (two) times daily.   Horse Chestnut 300 MG Caps Take 300 mg by mouth daily. Notes to patient:  You did not receive this medication during your hospital stay. You may resume.   HYDROcodone-acetaminophen 5-325 MG tablet Commonly known as:  NORCO/VICODIN Take 1 tablet by mouth every 6 (six) hours as needed. For pain.   metoprolol succinate 25 MG 24 hr tablet Commonly known as:  TOPROL-XL Take 25 mg by mouth daily.   MIRALAX PO Take 17 g by mouth daily.   multivitamin with minerals Tabs tablet Take 1 tablet by mouth daily.   nitroGLYCERIN 0.4 MG SL tablet Commonly known as:  NITROSTAT Place 0.4 mg under the tongue every 5 (five) minutes as needed for chest pain.   warfarin 3 MG tablet Commonly known as:  COUMADIN Take 1 tablet (3 mg total) by mouth daily. What changed:  how much to take  when to take this      Follow-up Kent, MD. Schedule an appointment as soon as possible for a visit in 1 week(s).   Specialty:  Cardiology Contact information: [address] [place] [address] [phone]            Time coordinating discharge: 35 minutes.  Signed:  Breannah Kratt  Pager [phone] [facility] 06/13/2016, 3:06 PM

## 2016-06-13 NOTE — Progress Notes (Signed)
Subjective:  He tolerated the cardioversion quite well yesterday.  He immediately feels better when he is back in sinus rhythm and is currently A-V pacemaking.  Dyspnea is better and he has done some ambulation.  No shortness of breath.  Weight is down about 7 pounds.  Objective:  Vital Signs in the last 24 hours: BP (!) 114/51 (BP Location: Left Arm)   Pulse 72   Temp 98.4 F (36.9 C) (Oral)   Resp 16   Ht 6\' 2"  (1.88 m)   Wt 105.8 kg (233 lb 3.2 oz)   SpO2 97%   BMI 29.94 kg/m   Physical Exam: Pleasant mildly obese male in no acute distress, sitting in chair in side of bed.   Lungs:  Clear  Cardiac:  Regular rhythm, normal S1 and S2, no S3 Abdomen:  Soft, nontender, no masses Extremities: No edema present  Intake/Output from previous day: 04/04 0701 - 04/05 0700 In: 580 [P.O.:480; I.V.:100] Out: 1900 [Urine:1900] Weight Filed Weights   06/11/16 0529 06/12/16 0500 06/13/16 0440  Weight: 106.5 kg (234 lb 12.6 oz) 106.5 kg (234 lb 12.6 oz) 105.8 kg (233 lb 3.2 oz)    Lab Results: Basic Metabolic Panel:  Recent Labs  06/12/16 0330 06/13/16 0323  NA 141 139  K 4.9 4.2  CL 102 99*  CO2 31 30  GLUCOSE 119* 98  BUN 79* 76*  CREATININE 3.25* 3.07*    CBC:  Recent Labs  06/11/16 0251  WBC 7.4  HGB 9.5*  HCT 31.8*  MCV 102.3*  PLT 219    BNP    Component Value Date/Time   BNP 920.1 (H) 06/10/2016 0203    Cardiac Panel (last 3 results)  Recent Labs  06/10/16 0917 06/10/16 1409 06/10/16 2033  TROPONINI <0.03 <0.03 <0.03    PROTIME: Lab Results  Component Value Date   INR 2.52 06/13/2016   INR 2.60 06/12/2016   INR 2.35 06/11/2016    Telemetry: AV paced rhythm, appears to be in sinus.  Assessment/Plan:  1.  Persistent atrial fibrillation-now in sinus rhythm following cardioversion yesterday 2.  Acute on chronic diastolic heart failure appears resolved and no longer appears volume overloaded  3.  Long-term use of anticoagulation 4.  CAD  with previous bypass grafting 5.  Stage IV to 5 chronic kidney disease  Recommendations:  From a cardiac viewpoint he would be fine to go home.  I would send him home on furosemide 160 mg in the morning and 80 mg in the afternoon.  He should remain on amiodarone 200 mg twice daily and I would need to see him in follow-up in one week.      Kerry Hough  MD [facility]  06/13/2016, 8:41 AM

## 2016-06-13 NOTE — Discharge Instructions (Signed)

## 2016-06-13 NOTE — Care Management Note (Signed)
Case Management Note Marvetta Gibbons RN, BSN Unit 2W-Case Manager [phone]  Patient Details  Name: Christopher [name] MRN: [id number] Date of Birth: [birth year]  Subjective/Objective:   Pt admitted with acute on chronic HF and afib-  s/p cardioversion this admission                 Action/Plan: PTA pt lived at home with wife- plan for pt to return home- No CM needs noted for discharge.    Expected Discharge Date:  06/13/16               Expected Discharge Plan:  [place]  In-House Referral:     Discharge planning Services  CM Consult  Post Acute Care Choice:  Home Health Choice offered to:     DME Arranged:    DME Agency:     HH Arranged:    [place] Agency:     Status of Service:  Completed, signed off  If discussed at [employer], dates discussed:    Additional Comments:  Dawayne Patricia, RN 06/13/2016, 11:13 AM

## 2016-06-17 ENCOUNTER — Encounter: Payer: Self-pay | Admitting: Vascular Surgery

## 2016-06-19 ENCOUNTER — Encounter (HOSPITAL_COMMUNITY)
Admission: RE | Admit: 2016-06-19 | Discharge: 2016-06-19 | Disposition: A | Discharge status: Home / Self Care | Payer: Medicare Other | Source: Ambulatory Visit | Attending: Nephrology | Admitting: Nephrology

## 2016-06-19 DIAGNOSIS — N184 Chronic kidney disease, stage 4 (severe): Secondary | ICD-10-CM | POA: Diagnosis not present

## 2016-06-19 DIAGNOSIS — D631 Anemia in chronic kidney disease: Secondary | ICD-10-CM | POA: Insufficient documentation

## 2016-06-19 DIAGNOSIS — N185 Chronic kidney disease, stage 5: Secondary | ICD-10-CM

## 2016-06-19 LAB — IRON AND TIBC
Iron: 108 ug/dL (ref 45–182)
Saturation Ratios: 34 % (ref 17.9–39.5)
TIBC: 321 ug/dL (ref 250–450)
UIBC: 213 ug/dL

## 2016-06-19 LAB — FERRITIN: Ferritin: 360 ng/mL — ABNORMAL HIGH (ref 24–336)

## 2016-06-19 LAB — POCT HEMOGLOBIN-HEMACUE: Hemoglobin: 10.6 g/dL — ABNORMAL LOW (ref 13.0–17.0)

## 2016-06-19 MED ORDER — DARBEPOETIN ALFA 100 MCG/0.5ML IJ SOSY
100.0000 ug | PREFILLED_SYRINGE | INTRAMUSCULAR | Status: DC
  Administered 2016-06-19: 100 ug via SUBCUTANEOUS

## 2016-06-19 MED ORDER — DARBEPOETIN ALFA 100 MCG/0.5ML IJ SOSY
PREFILLED_SYRINGE | INTRAMUSCULAR | Status: AC
  Filled 2016-06-19: qty 0.5

## 2016-06-20 DIAGNOSIS — I2510 Atherosclerotic heart disease of native coronary artery without angina pectoris: Secondary | ICD-10-CM | POA: Diagnosis not present

## 2016-06-20 DIAGNOSIS — I119 Hypertensive heart disease without heart failure: Secondary | ICD-10-CM | POA: Diagnosis not present

## 2016-06-20 DIAGNOSIS — Z951 Presence of aortocoronary bypass graft: Secondary | ICD-10-CM | POA: Diagnosis not present

## 2016-06-20 DIAGNOSIS — Z950 Presence of cardiac pacemaker: Secondary | ICD-10-CM | POA: Diagnosis not present

## 2016-06-20 DIAGNOSIS — Z7901 Long term (current) use of anticoagulants: Secondary | ICD-10-CM | POA: Diagnosis not present

## 2016-06-20 DIAGNOSIS — R0602 Shortness of breath: Secondary | ICD-10-CM | POA: Diagnosis not present

## 2016-06-20 DIAGNOSIS — E1122 Type 2 diabetes mellitus with diabetic chronic kidney disease: Secondary | ICD-10-CM | POA: Diagnosis not present

## 2016-06-20 DIAGNOSIS — I5032 Chronic diastolic (congestive) heart failure: Secondary | ICD-10-CM | POA: Diagnosis not present

## 2016-06-20 DIAGNOSIS — R0600 Dyspnea, unspecified: Secondary | ICD-10-CM | POA: Diagnosis not present

## 2016-06-20 DIAGNOSIS — N184 Chronic kidney disease, stage 4 (severe): Secondary | ICD-10-CM | POA: Diagnosis not present

## 2016-06-20 DIAGNOSIS — E785 Hyperlipidemia, unspecified: Secondary | ICD-10-CM | POA: Diagnosis not present

## 2016-06-20 DIAGNOSIS — I480 Paroxysmal atrial fibrillation: Secondary | ICD-10-CM | POA: Diagnosis not present

## 2016-06-21 DIAGNOSIS — I509 Heart failure, unspecified: Secondary | ICD-10-CM | POA: Diagnosis not present

## 2016-06-21 DIAGNOSIS — E1122 Type 2 diabetes mellitus with diabetic chronic kidney disease: Secondary | ICD-10-CM | POA: Diagnosis not present

## 2016-06-21 DIAGNOSIS — N184 Chronic kidney disease, stage 4 (severe): Secondary | ICD-10-CM | POA: Diagnosis not present

## 2016-06-21 DIAGNOSIS — I10 Essential (primary) hypertension: Secondary | ICD-10-CM | POA: Diagnosis not present

## 2016-06-21 DIAGNOSIS — Z6832 Body mass index (BMI) 32.0-32.9, adult: Secondary | ICD-10-CM | POA: Diagnosis not present

## 2016-06-24 ENCOUNTER — Other Ambulatory Visit: Payer: Self-pay | Admitting: Vascular Surgery

## 2016-06-24 DIAGNOSIS — I770 Arteriovenous fistula, acquired: Secondary | ICD-10-CM

## 2016-06-26 ENCOUNTER — Ambulatory Visit (HOSPITAL_COMMUNITY)
Admission: RE | Admit: 2016-06-26 | Discharge: 2016-06-26 | Disposition: A | Discharge status: Home / Self Care | Payer: Medicare Other | Source: Ambulatory Visit | Attending: Family | Admitting: Family

## 2016-06-26 ENCOUNTER — Ambulatory Visit (INDEPENDENT_AMBULATORY_CARE_PROVIDER_SITE_OTHER): Payer: Self-pay | Admitting: Physician Assistant

## 2016-06-26 VITALS — BP 124/73 | HR 75 | Temp 97.0°F | Resp 20 | Ht 74.0 in | Wt 226.0 lb

## 2016-06-26 DIAGNOSIS — I770 Arteriovenous fistula, acquired: Secondary | ICD-10-CM | POA: Diagnosis not present

## 2016-06-26 DIAGNOSIS — N184 Chronic kidney disease, stage 4 (severe): Secondary | ICD-10-CM

## 2016-06-26 NOTE — Progress Notes (Signed)
POST OPERATIVE OFFICE NOTE    CC:  F/u for surgery  HPI:  This is a 83 y.o. male who is s/p right radiocephalic av fistula 5/0/3546.  He is not on hemodialysis but has previously had a left radiocephalic fistula which occluded. He subsequently had a left brachiocephalic fistula and developed venous hypertension. This was ligated. He has intermittent numbness in the right finger tips.  He states that if he moves his hand and arm the numbness goes away.  No muscle weakness or skin discoloration in the right hand.    Allergies  Allergen Reactions  . Crestor [Rosuvastatin] Other (See Comments)    "ACHING JOINTS"  . Lipitor [Atorvastatin] Other (See Comments)    "ACHING JOINTS"  . Statins Other (See Comments)    "ACHING JOINTS"  . Penicillins Rash and Other (See Comments)    Has patient had a PCN reaction causing immediate rash, facial/tongue/throat swelling, SOB or lightheadedness with hypotension: yes Has patient had a PCN reaction causing severe rash involving mucus membranes or skin necrosis: no Has patient had a PCN reaction that required hospitalization no Has patient had a PCN reaction occurring within the last 10 years: no If all of the above answers are "NO", then may proceed with Cephalosporin use.   . Tramadol Nausea Only    Current Outpatient Prescriptions  Medication Sig Dispense Refill  . amiodarone (PACERONE) 200 MG tablet Take 1 tablet (200 mg total) by mouth 2 (two) times daily.    . carboxymethylcellulose (REFRESH PLUS) 0.5 % SOLN Place 1 drop into both eyes 3 (three) times daily as needed (for dry eyes.).    . Darbepoetin Alfa (ARANESP) 100 MCG/0.5ML SOSY injection Inject 100 mcg into the skin every 21 ( twenty-one) days.    . folic acid (FOLVITE) 568 MCG tablet Take 400 mcg by mouth daily.      . furosemide (LASIX) 80 MG tablet Take 1-2 tablets (80-160 mg total) by mouth 2 (two) times daily. Take 160 mg in the morning and 80 mg in the evening.    Marland Kitchen glimepiride  (AMARYL) 1 MG tablet Take 1 tablet (1 mg total) by mouth daily before breakfast. 30 tablet 1  . Glucosamine HCl 1500 MG TABS Take 1,500 mg by mouth 2 (two) times daily.     . Horse Chestnut 300 MG CAPS Take 300 mg by mouth daily.     Marland Kitchen HYDROcodone-acetaminophen (NORCO/VICODIN) 5-325 MG tablet Take 1 tablet by mouth every 6 (six) hours as needed. For pain. 6 tablet 0  . metoprolol succinate (TOPROL-XL) 25 MG 24 hr tablet Take 25 mg by mouth daily.    . Multiple Vitamin (MULTIVITAMIN WITH MINERALS) TABS tablet Take 1 tablet by mouth daily.    . nitroGLYCERIN (NITROSTAT) 0.4 MG SL tablet Place 0.4 mg under the tongue every 5 (five) minutes as needed for chest pain.    . Omega-3 Fatty Acids (FISH OIL) 1200 MG CAPS Take 1,200 mg by mouth 2 (two) times daily.     . Polyethylene Glycol 3350 (MIRALAX PO) Take 17 g by mouth daily.     Marland Kitchen warfarin (COUMADIN) 3 MG tablet Take 1 tablet (3 mg total) by mouth daily. (Patient taking differently: Take 4 mg by mouth every evening. )     No current facility-administered medications for this visit.      ROS:  See HPI  Physical Exam:  Vitals:   06/26/16 1513  BP: 124/73  Pulse: 75  Resp: 20  Temp: 97 F (  36.1 C)    Fistula duplex left radiocephalic diameter 2.68-3.41, depth 0.43-0.20  Incision:  Well healed Extremities:  Grip 5/5, palpable thrill proximal half of the fistula.  Doppler signal biphasic palmar arch. Neuro: sensation grossly intact and equal B   Assessment/Plan:  This is a 83 y.o. male who is s/p: 6 weeks s/p Right Radial Cephalic AV fistula   He is currently not on HD.  We will allow the fistula to continue to mature and have him f/u in 2 1/2 months for repeat fistula duplex.  If he has increased numbness, weakness or discoloration to his fingers he will call us sooner.      Laurence Slate [facility] PA-C [facility] [phone]  Clinic MD:  Oneida Alar

## 2016-06-27 NOTE — Addendum Note (Signed)
Addended by: Lianne Cure A on: 06/27/2016 09:43 AM   Modules accepted: Orders

## 2016-07-01 DIAGNOSIS — N184 Chronic kidney disease, stage 4 (severe): Secondary | ICD-10-CM | POA: Diagnosis not present

## 2016-07-01 DIAGNOSIS — I129 Hypertensive chronic kidney disease with stage 1 through stage 4 chronic kidney disease, or unspecified chronic kidney disease: Secondary | ICD-10-CM | POA: Diagnosis not present

## 2016-07-01 DIAGNOSIS — Z6832 Body mass index (BMI) 32.0-32.9, adult: Secondary | ICD-10-CM | POA: Diagnosis not present

## 2016-07-01 DIAGNOSIS — D631 Anemia in chronic kidney disease: Secondary | ICD-10-CM | POA: Diagnosis not present

## 2016-07-01 DIAGNOSIS — E1129 Type 2 diabetes mellitus with other diabetic kidney complication: Secondary | ICD-10-CM | POA: Diagnosis not present

## 2016-07-01 DIAGNOSIS — Z6831 Body mass index (BMI) 31.0-31.9, adult: Secondary | ICD-10-CM | POA: Diagnosis not present

## 2016-07-02 DIAGNOSIS — I2510 Atherosclerotic heart disease of native coronary artery without angina pectoris: Secondary | ICD-10-CM | POA: Diagnosis not present

## 2016-07-02 DIAGNOSIS — N184 Chronic kidney disease, stage 4 (severe): Secondary | ICD-10-CM | POA: Diagnosis not present

## 2016-07-02 DIAGNOSIS — Z950 Presence of cardiac pacemaker: Secondary | ICD-10-CM | POA: Diagnosis not present

## 2016-07-02 DIAGNOSIS — R0600 Dyspnea, unspecified: Secondary | ICD-10-CM | POA: Diagnosis not present

## 2016-07-02 DIAGNOSIS — E1122 Type 2 diabetes mellitus with diabetic chronic kidney disease: Secondary | ICD-10-CM | POA: Diagnosis not present

## 2016-07-02 DIAGNOSIS — Z7901 Long term (current) use of anticoagulants: Secondary | ICD-10-CM | POA: Diagnosis not present

## 2016-07-02 DIAGNOSIS — I480 Paroxysmal atrial fibrillation: Secondary | ICD-10-CM | POA: Diagnosis not present

## 2016-07-02 DIAGNOSIS — R0602 Shortness of breath: Secondary | ICD-10-CM | POA: Diagnosis not present

## 2016-07-02 DIAGNOSIS — Z951 Presence of aortocoronary bypass graft: Secondary | ICD-10-CM | POA: Diagnosis not present

## 2016-07-02 DIAGNOSIS — I119 Hypertensive heart disease without heart failure: Secondary | ICD-10-CM | POA: Diagnosis not present

## 2016-07-02 DIAGNOSIS — E785 Hyperlipidemia, unspecified: Secondary | ICD-10-CM | POA: Diagnosis not present

## 2016-07-02 DIAGNOSIS — I5032 Chronic diastolic (congestive) heart failure: Secondary | ICD-10-CM | POA: Diagnosis not present

## 2016-07-05 DIAGNOSIS — N184 Chronic kidney disease, stage 4 (severe): Secondary | ICD-10-CM | POA: Diagnosis not present

## 2016-07-05 DIAGNOSIS — I5032 Chronic diastolic (congestive) heart failure: Secondary | ICD-10-CM | POA: Diagnosis not present

## 2016-07-05 DIAGNOSIS — R0602 Shortness of breath: Secondary | ICD-10-CM | POA: Diagnosis not present

## 2016-07-05 DIAGNOSIS — Z951 Presence of aortocoronary bypass graft: Secondary | ICD-10-CM | POA: Diagnosis not present

## 2016-07-05 DIAGNOSIS — I2510 Atherosclerotic heart disease of native coronary artery without angina pectoris: Secondary | ICD-10-CM | POA: Diagnosis not present

## 2016-07-05 DIAGNOSIS — Z7901 Long term (current) use of anticoagulants: Secondary | ICD-10-CM | POA: Diagnosis not present

## 2016-07-05 DIAGNOSIS — R0600 Dyspnea, unspecified: Secondary | ICD-10-CM | POA: Diagnosis not present

## 2016-07-05 DIAGNOSIS — E1122 Type 2 diabetes mellitus with diabetic chronic kidney disease: Secondary | ICD-10-CM | POA: Diagnosis not present

## 2016-07-05 DIAGNOSIS — I480 Paroxysmal atrial fibrillation: Secondary | ICD-10-CM | POA: Diagnosis not present

## 2016-07-05 DIAGNOSIS — Z950 Presence of cardiac pacemaker: Secondary | ICD-10-CM | POA: Diagnosis not present

## 2016-07-05 DIAGNOSIS — E785 Hyperlipidemia, unspecified: Secondary | ICD-10-CM | POA: Diagnosis not present

## 2016-07-05 DIAGNOSIS — I119 Hypertensive heart disease without heart failure: Secondary | ICD-10-CM | POA: Diagnosis not present

## 2016-07-08 DIAGNOSIS — R0602 Shortness of breath: Secondary | ICD-10-CM | POA: Diagnosis not present

## 2016-07-08 DIAGNOSIS — Z7901 Long term (current) use of anticoagulants: Secondary | ICD-10-CM | POA: Diagnosis not present

## 2016-07-08 DIAGNOSIS — N184 Chronic kidney disease, stage 4 (severe): Secondary | ICD-10-CM | POA: Diagnosis not present

## 2016-07-08 DIAGNOSIS — I480 Paroxysmal atrial fibrillation: Secondary | ICD-10-CM | POA: Diagnosis not present

## 2016-07-08 DIAGNOSIS — I5032 Chronic diastolic (congestive) heart failure: Secondary | ICD-10-CM | POA: Diagnosis not present

## 2016-07-08 DIAGNOSIS — Z951 Presence of aortocoronary bypass graft: Secondary | ICD-10-CM | POA: Diagnosis not present

## 2016-07-08 DIAGNOSIS — I119 Hypertensive heart disease without heart failure: Secondary | ICD-10-CM | POA: Diagnosis not present

## 2016-07-08 DIAGNOSIS — R0600 Dyspnea, unspecified: Secondary | ICD-10-CM | POA: Diagnosis not present

## 2016-07-08 DIAGNOSIS — I2510 Atherosclerotic heart disease of native coronary artery without angina pectoris: Secondary | ICD-10-CM | POA: Diagnosis not present

## 2016-07-08 DIAGNOSIS — E785 Hyperlipidemia, unspecified: Secondary | ICD-10-CM | POA: Diagnosis not present

## 2016-07-08 DIAGNOSIS — E1122 Type 2 diabetes mellitus with diabetic chronic kidney disease: Secondary | ICD-10-CM | POA: Diagnosis not present

## 2016-07-08 DIAGNOSIS — Z950 Presence of cardiac pacemaker: Secondary | ICD-10-CM | POA: Diagnosis not present

## 2016-07-10 ENCOUNTER — Encounter (HOSPITAL_COMMUNITY)
Admission: RE | Admit: 2016-07-10 | Discharge: 2016-07-10 | Disposition: A | Discharge status: Home / Self Care | Payer: Medicare Other | Source: Ambulatory Visit | Attending: Nephrology | Admitting: Nephrology

## 2016-07-10 DIAGNOSIS — D638 Anemia in other chronic diseases classified elsewhere: Secondary | ICD-10-CM | POA: Insufficient documentation

## 2016-07-10 DIAGNOSIS — N184 Chronic kidney disease, stage 4 (severe): Secondary | ICD-10-CM | POA: Insufficient documentation

## 2016-07-10 DIAGNOSIS — N185 Chronic kidney disease, stage 5: Secondary | ICD-10-CM

## 2016-07-10 LAB — POCT HEMOGLOBIN-HEMACUE: HEMOGLOBIN: 10.7 g/dL — AB (ref 13.0–17.0)

## 2016-07-10 MED ORDER — DARBEPOETIN ALFA 100 MCG/0.5ML IJ SOSY
PREFILLED_SYRINGE | INTRAMUSCULAR | Status: AC
  Filled 2016-07-10: qty 0.5

## 2016-07-10 MED ORDER — DARBEPOETIN ALFA 100 MCG/0.5ML IJ SOSY
100.0000 ug | PREFILLED_SYRINGE | INTRAMUSCULAR | Status: DC
  Administered 2016-07-10: 100 ug via SUBCUTANEOUS

## 2016-07-12 DIAGNOSIS — C61 Malignant neoplasm of prostate: Secondary | ICD-10-CM | POA: Diagnosis not present

## 2016-07-15 DIAGNOSIS — R0600 Dyspnea, unspecified: Secondary | ICD-10-CM | POA: Diagnosis not present

## 2016-07-15 DIAGNOSIS — E785 Hyperlipidemia, unspecified: Secondary | ICD-10-CM | POA: Diagnosis not present

## 2016-07-15 DIAGNOSIS — Z950 Presence of cardiac pacemaker: Secondary | ICD-10-CM | POA: Diagnosis not present

## 2016-07-15 DIAGNOSIS — E1122 Type 2 diabetes mellitus with diabetic chronic kidney disease: Secondary | ICD-10-CM | POA: Diagnosis not present

## 2016-07-15 DIAGNOSIS — N184 Chronic kidney disease, stage 4 (severe): Secondary | ICD-10-CM | POA: Diagnosis not present

## 2016-07-15 DIAGNOSIS — I2510 Atherosclerotic heart disease of native coronary artery without angina pectoris: Secondary | ICD-10-CM | POA: Diagnosis not present

## 2016-07-15 DIAGNOSIS — I119 Hypertensive heart disease without heart failure: Secondary | ICD-10-CM | POA: Diagnosis not present

## 2016-07-15 DIAGNOSIS — I5032 Chronic diastolic (congestive) heart failure: Secondary | ICD-10-CM | POA: Diagnosis not present

## 2016-07-15 DIAGNOSIS — R0602 Shortness of breath: Secondary | ICD-10-CM | POA: Diagnosis not present

## 2016-07-15 DIAGNOSIS — Z951 Presence of aortocoronary bypass graft: Secondary | ICD-10-CM | POA: Diagnosis not present

## 2016-07-15 DIAGNOSIS — I480 Paroxysmal atrial fibrillation: Secondary | ICD-10-CM | POA: Diagnosis not present

## 2016-07-15 DIAGNOSIS — Z7901 Long term (current) use of anticoagulants: Secondary | ICD-10-CM | POA: Diagnosis not present

## 2016-07-19 DIAGNOSIS — C61 Malignant neoplasm of prostate: Secondary | ICD-10-CM | POA: Diagnosis not present

## 2016-07-29 DIAGNOSIS — N184 Chronic kidney disease, stage 4 (severe): Secondary | ICD-10-CM | POA: Diagnosis not present

## 2016-07-29 DIAGNOSIS — Z951 Presence of aortocoronary bypass graft: Secondary | ICD-10-CM | POA: Diagnosis not present

## 2016-07-29 DIAGNOSIS — I2510 Atherosclerotic heart disease of native coronary artery without angina pectoris: Secondary | ICD-10-CM | POA: Diagnosis not present

## 2016-07-29 DIAGNOSIS — I119 Hypertensive heart disease without heart failure: Secondary | ICD-10-CM | POA: Diagnosis not present

## 2016-07-29 DIAGNOSIS — R0602 Shortness of breath: Secondary | ICD-10-CM | POA: Diagnosis not present

## 2016-07-29 DIAGNOSIS — I480 Paroxysmal atrial fibrillation: Secondary | ICD-10-CM | POA: Diagnosis not present

## 2016-07-29 DIAGNOSIS — I5032 Chronic diastolic (congestive) heart failure: Secondary | ICD-10-CM | POA: Diagnosis not present

## 2016-07-29 DIAGNOSIS — E1122 Type 2 diabetes mellitus with diabetic chronic kidney disease: Secondary | ICD-10-CM | POA: Diagnosis not present

## 2016-07-29 DIAGNOSIS — Z7901 Long term (current) use of anticoagulants: Secondary | ICD-10-CM | POA: Diagnosis not present

## 2016-07-29 DIAGNOSIS — E785 Hyperlipidemia, unspecified: Secondary | ICD-10-CM | POA: Diagnosis not present

## 2016-07-29 DIAGNOSIS — R0600 Dyspnea, unspecified: Secondary | ICD-10-CM | POA: Diagnosis not present

## 2016-07-29 DIAGNOSIS — Z950 Presence of cardiac pacemaker: Secondary | ICD-10-CM | POA: Diagnosis not present

## 2016-07-31 ENCOUNTER — Encounter (HOSPITAL_COMMUNITY)
Admission: RE | Admit: 2016-07-31 | Discharge: 2016-07-31 | Disposition: A | Discharge status: Home / Self Care | Payer: Medicare Other | Source: Ambulatory Visit | Attending: Nephrology | Admitting: Nephrology

## 2016-07-31 DIAGNOSIS — N184 Chronic kidney disease, stage 4 (severe): Secondary | ICD-10-CM | POA: Diagnosis not present

## 2016-07-31 DIAGNOSIS — D638 Anemia in other chronic diseases classified elsewhere: Secondary | ICD-10-CM | POA: Diagnosis not present

## 2016-07-31 DIAGNOSIS — N185 Chronic kidney disease, stage 5: Secondary | ICD-10-CM

## 2016-07-31 LAB — IRON AND TIBC
Iron: 105 ug/dL (ref 45–182)
Saturation Ratios: 34 % (ref 17.9–39.5)
TIBC: 311 ug/dL (ref 250–450)
UIBC: 206 ug/dL

## 2016-07-31 LAB — FERRITIN: Ferritin: 518 ng/mL — ABNORMAL HIGH (ref 24–336)

## 2016-07-31 LAB — POCT HEMOGLOBIN-HEMACUE: HEMOGLOBIN: 10.1 g/dL — AB (ref 13.0–17.0)

## 2016-07-31 MED ORDER — DARBEPOETIN ALFA 100 MCG/0.5ML IJ SOSY
100.0000 ug | PREFILLED_SYRINGE | INTRAMUSCULAR | Status: DC
  Administered 2016-07-31: 100 ug via SUBCUTANEOUS

## 2016-07-31 MED ORDER — DARBEPOETIN ALFA 100 MCG/0.5ML IJ SOSY
PREFILLED_SYRINGE | INTRAMUSCULAR | Status: AC
  Filled 2016-07-31: qty 0.5

## 2016-08-06 DIAGNOSIS — I480 Paroxysmal atrial fibrillation: Secondary | ICD-10-CM | POA: Diagnosis not present

## 2016-08-06 DIAGNOSIS — R0600 Dyspnea, unspecified: Secondary | ICD-10-CM | POA: Diagnosis not present

## 2016-08-06 DIAGNOSIS — E1122 Type 2 diabetes mellitus with diabetic chronic kidney disease: Secondary | ICD-10-CM | POA: Diagnosis not present

## 2016-08-06 DIAGNOSIS — I5032 Chronic diastolic (congestive) heart failure: Secondary | ICD-10-CM | POA: Diagnosis not present

## 2016-08-06 DIAGNOSIS — I482 Chronic atrial fibrillation: Secondary | ICD-10-CM | POA: Diagnosis not present

## 2016-08-06 DIAGNOSIS — E785 Hyperlipidemia, unspecified: Secondary | ICD-10-CM | POA: Diagnosis not present

## 2016-08-06 DIAGNOSIS — Z7901 Long term (current) use of anticoagulants: Secondary | ICD-10-CM | POA: Diagnosis not present

## 2016-08-06 DIAGNOSIS — I2510 Atherosclerotic heart disease of native coronary artery without angina pectoris: Secondary | ICD-10-CM | POA: Diagnosis not present

## 2016-08-06 DIAGNOSIS — Z950 Presence of cardiac pacemaker: Secondary | ICD-10-CM | POA: Diagnosis not present

## 2016-08-06 DIAGNOSIS — Z951 Presence of aortocoronary bypass graft: Secondary | ICD-10-CM | POA: Diagnosis not present

## 2016-08-06 DIAGNOSIS — N184 Chronic kidney disease, stage 4 (severe): Secondary | ICD-10-CM | POA: Diagnosis not present

## 2016-08-06 DIAGNOSIS — I119 Hypertensive heart disease without heart failure: Secondary | ICD-10-CM | POA: Diagnosis not present

## 2016-08-12 DIAGNOSIS — H52203 Unspecified astigmatism, bilateral: Secondary | ICD-10-CM | POA: Diagnosis not present

## 2016-08-12 DIAGNOSIS — H04123 Dry eye syndrome of bilateral lacrimal glands: Secondary | ICD-10-CM | POA: Diagnosis not present

## 2016-08-12 DIAGNOSIS — Z6831 Body mass index (BMI) 31.0-31.9, adult: Secondary | ICD-10-CM | POA: Diagnosis not present

## 2016-08-12 DIAGNOSIS — H01001 Unspecified blepharitis right upper eyelid: Secondary | ICD-10-CM | POA: Diagnosis not present

## 2016-08-12 DIAGNOSIS — E119 Type 2 diabetes mellitus without complications: Secondary | ICD-10-CM | POA: Diagnosis not present

## 2016-08-12 DIAGNOSIS — I509 Heart failure, unspecified: Secondary | ICD-10-CM | POA: Diagnosis not present

## 2016-08-12 DIAGNOSIS — R0602 Shortness of breath: Secondary | ICD-10-CM | POA: Diagnosis not present

## 2016-08-21 ENCOUNTER — Encounter (HOSPITAL_COMMUNITY)
Admission: RE | Admit: 2016-08-21 | Discharge: 2016-08-21 | Disposition: A | Discharge status: Home / Self Care | Payer: Medicare Other | Source: Ambulatory Visit | Attending: Nephrology | Admitting: Nephrology

## 2016-08-21 DIAGNOSIS — D638 Anemia in other chronic diseases classified elsewhere: Secondary | ICD-10-CM | POA: Insufficient documentation

## 2016-08-21 DIAGNOSIS — N185 Chronic kidney disease, stage 5: Secondary | ICD-10-CM

## 2016-08-21 DIAGNOSIS — N184 Chronic kidney disease, stage 4 (severe): Secondary | ICD-10-CM | POA: Insufficient documentation

## 2016-08-21 LAB — POCT HEMOGLOBIN-HEMACUE: Hemoglobin: 10.4 g/dL — ABNORMAL LOW (ref 13.0–17.0)

## 2016-08-21 MED ORDER — DARBEPOETIN ALFA 100 MCG/0.5ML IJ SOSY
PREFILLED_SYRINGE | INTRAMUSCULAR | Status: AC
  Administered 2016-08-21: 13:00:00 100 ug via SUBCUTANEOUS
  Filled 2016-08-21: qty 0.5

## 2016-08-21 MED ORDER — DARBEPOETIN ALFA 100 MCG/0.5ML IJ SOSY
100.0000 ug | PREFILLED_SYRINGE | INTRAMUSCULAR | Status: DC
  Administered 2016-08-21: 100 ug via SUBCUTANEOUS

## 2016-08-23 ENCOUNTER — Encounter: Payer: Self-pay | Admitting: Vascular Surgery

## 2016-08-28 DIAGNOSIS — Z7901 Long term (current) use of anticoagulants: Secondary | ICD-10-CM | POA: Diagnosis not present

## 2016-08-28 DIAGNOSIS — E785 Hyperlipidemia, unspecified: Secondary | ICD-10-CM | POA: Diagnosis not present

## 2016-08-28 DIAGNOSIS — N184 Chronic kidney disease, stage 4 (severe): Secondary | ICD-10-CM | POA: Diagnosis not present

## 2016-08-28 DIAGNOSIS — I480 Paroxysmal atrial fibrillation: Secondary | ICD-10-CM | POA: Diagnosis not present

## 2016-08-28 DIAGNOSIS — I119 Hypertensive heart disease without heart failure: Secondary | ICD-10-CM | POA: Diagnosis not present

## 2016-08-28 DIAGNOSIS — I5032 Chronic diastolic (congestive) heart failure: Secondary | ICD-10-CM | POA: Diagnosis not present

## 2016-08-28 DIAGNOSIS — R0600 Dyspnea, unspecified: Secondary | ICD-10-CM | POA: Diagnosis not present

## 2016-08-28 DIAGNOSIS — Z950 Presence of cardiac pacemaker: Secondary | ICD-10-CM | POA: Diagnosis not present

## 2016-08-28 DIAGNOSIS — I482 Chronic atrial fibrillation: Secondary | ICD-10-CM | POA: Diagnosis not present

## 2016-08-28 DIAGNOSIS — I2510 Atherosclerotic heart disease of native coronary artery without angina pectoris: Secondary | ICD-10-CM | POA: Diagnosis not present

## 2016-08-28 DIAGNOSIS — Z951 Presence of aortocoronary bypass graft: Secondary | ICD-10-CM | POA: Diagnosis not present

## 2016-08-28 DIAGNOSIS — E1122 Type 2 diabetes mellitus with diabetic chronic kidney disease: Secondary | ICD-10-CM | POA: Diagnosis not present

## 2016-08-29 DIAGNOSIS — E1129 Type 2 diabetes mellitus with other diabetic kidney complication: Secondary | ICD-10-CM | POA: Diagnosis not present

## 2016-08-29 DIAGNOSIS — N184 Chronic kidney disease, stage 4 (severe): Secondary | ICD-10-CM | POA: Diagnosis not present

## 2016-08-29 DIAGNOSIS — Z6832 Body mass index (BMI) 32.0-32.9, adult: Secondary | ICD-10-CM | POA: Diagnosis not present

## 2016-08-29 DIAGNOSIS — Z6831 Body mass index (BMI) 31.0-31.9, adult: Secondary | ICD-10-CM | POA: Diagnosis not present

## 2016-08-29 DIAGNOSIS — D631 Anemia in chronic kidney disease: Secondary | ICD-10-CM | POA: Diagnosis not present

## 2016-08-29 DIAGNOSIS — I129 Hypertensive chronic kidney disease with stage 1 through stage 4 chronic kidney disease, or unspecified chronic kidney disease: Secondary | ICD-10-CM | POA: Diagnosis not present

## 2016-09-05 ENCOUNTER — Ambulatory Visit (HOSPITAL_COMMUNITY)
Admission: RE | Admit: 2016-09-05 | Discharge: 2016-09-05 | Disposition: A | Discharge status: Home / Self Care | Payer: Medicare Other | Source: Ambulatory Visit | Attending: Vascular Surgery | Admitting: Vascular Surgery

## 2016-09-05 ENCOUNTER — Ambulatory Visit (INDEPENDENT_AMBULATORY_CARE_PROVIDER_SITE_OTHER): Payer: Medicare Other | Admitting: Physician Assistant

## 2016-09-05 ENCOUNTER — Encounter: Payer: Self-pay | Admitting: Physician Assistant

## 2016-09-05 VITALS — BP 126/73 | HR 76 | Temp 98.0°F | Resp 20 | Ht 74.0 in | Wt 228.6 lb

## 2016-09-05 DIAGNOSIS — N184 Chronic kidney disease, stage 4 (severe): Secondary | ICD-10-CM | POA: Diagnosis not present

## 2016-09-05 DIAGNOSIS — Z9889 Other specified postprocedural states: Secondary | ICD-10-CM | POA: Diagnosis not present

## 2016-09-05 NOTE — Progress Notes (Signed)
HISTORY AND PHYSICAL     CC:  Follow up Requesting Provider:  Prince Solian, MD  HPI: This is a 83 y.o. male who is s/p right radial cephalic AV fistula on 06/12/29.  He previously had a left radial cephalic AV fistula, which occluded.  He then had a left brachial cephalic AV fistula and developed venous hypertension and this was ligated.    He states that he has pain from his shoulder to his finger tips sometimes at night when he is laying on his left side.  He says that he has numbness and tingling in the tip of his right thumb and his hand just doesn't feel right.  He states that he can't feel the coffee mug when he picks it up.  He has trouble with his zipper on his pants.  He is able to write with his right hand. He states that his left hand has improved.  He is not on dialysis.  His nephrologist is Dr. Mercy Moore.  Pt states his renal function has not changed since seeing Dr. Mercy Moore 2 years ago.  He is not on a statin due to allergy.  He is on a beta blockker for blood pressure control.  He is on coumadin and amiodarone for PAF.  He is on oral agents for diabetes.    Past Medical History:  Diagnosis Date  . Anemia   . Atrial fibrillation ([place]) 08/07/2011   CHADS score  2 CHADS2 VASC score 4  Onset winter of 2013 Cardioversion May 2013 Reversion to a fib 3/14 amiodarone begun 07/02/12  Repeat cardioversion Jul 16, 2012    . BPH (benign prostatic hyperplasia)   . CAD (coronary artery disease) 03/06/2010   CABG with LIMA to LAD, SVG to OM-OM2  05/05/01 Dr. Roxan Hockey  Cardiac Cath 12/2009 60% left main, occluded LAD, Widely patent SVG to OM1 and 2, patent LIMA, widely patent RCA    . Cardiac pacemaker in situ    07/26/10  Inserted for chronotropic incompetence and dyspnea  RA lead  Medtronic 5076  serial number [id number]   RV lead  Medtronic 5076  serial number [id number] Medtronic Adapta RL pulse generator, SN  [id number]    . Chronic diastolic congestive heart failure, NYHA class 2  ([place]) 06/10/2016  . Chronic kidney disease (CKD), stage IV (severe) (HCC)    Dr. Mercy Moore  . Diabetes mellitus without complication ([place])    Type II  . History of skin cancer   . Hypertensive heart disease without CHF   . Lumbar disc disease 1992  . Prostate cancer ([place])   . Sinus node dysfunction ([facility])    symptomatic bradycardia    Past Surgical History:  Procedure Laterality Date  . APPENDECTOMY  1980  . AV FISTULA PLACEMENT Left 11/08/2015   Procedure: ARTERIOVENOUS (AV) FISTULA CREATION;  Surgeon: [name], MD;  Location: [place];  Service: Vascular;  Laterality: Left;  . AV FISTULA PLACEMENT Left 01/03/2016   Procedure: Creation of BRACHIOCEPHALIC ARTERIOVENOUS (AV) FISTULA Left arm;  Surgeon: [name], MD;  Location: [facility] OR;  Service: Vascular;  Laterality: Left;  . AV FISTULA PLACEMENT Right 05/14/2016   Procedure: RADIOCEPHALIC ARTERIOVENOUS (AV) FISTULA CREATION RIGHT LOWER ARM;  Surgeon: Elam Dutch, MD;  Location: [place];  Service: Vascular;  Laterality: Right;  . CARDIAC CATHETERIZATION  2003  . CARDIOVERSION  08/08/2011   Procedure: CARDIOVERSION;  Surgeon: Jacolyn Reedy, MD;  Location: [place];  Service: Cardiovascular;  Laterality: N/A;  .  CARDIOVERSION N/A 07/16/2012   Procedure: CARDIOVERSION;  Surgeon: Jacolyn Reedy, MD;  Location: [facility] ENDOSCOPY;  Service: Cardiovascular;  Laterality: N/A;  . CARDIOVERSION N/A 02/27/2015   Procedure: CARDIOVERSION;  Surgeon: Jacolyn Reedy, MD;  Location: [facility] ENDOSCOPY;  Service: Cardiovascular;  Laterality: N/A;  . CARDIOVERSION N/A 09/21/2015   Procedure: CARDIOVERSION;  Surgeon: Jacolyn Reedy, MD;  Location: [facility] ENDOSCOPY;  Service: Cardiovascular;  Laterality: N/A;  . CARDIOVERSION N/A 02/22/2016   Procedure: CARDIOVERSION;  Surgeon: Dorothy Spark, MD;  Location: [facility] ENDOSCOPY;  Service: Cardiovascular;  Laterality: N/A;  . CARDIOVERSION N/A 06/12/2016   Procedure: CARDIOVERSION;  Surgeon: Jacolyn Reedy, MD;   Location: [facility] ENDOSCOPY;  Service: Cardiovascular;  Laterality: N/A;  . cataracts removed    . COLONOSCOPY    . CORONARY ARTERY BYPASS GRAFT  2003   x3  . CYSTOSCOPY WITH BIOPSY N/A 06/21/2013   Procedure: CYSTOSCOPY WITH BIOPSY;  Surgeon: Molli Hazard, MD;  Location: [facility] ORS;  Service: Urology;  Laterality: N/A;    TUR RESECTION OF POLYP BILATERAL RETROGRADE PYELOGRAM BLADDER BIOPSY    . EYE SURGERY Bilateral    Cataracts  . HIP PINNING,CANNULATED Right 06/30/2014   Procedure: CANNULATED HIP PINNING;  Surgeon: Renette Butters, MD;  Location: [place];  Service: Orthopedics;  Laterality: Right;  . LIGATION OF ARTERIOVENOUS  FISTULA Left 01/17/2016   Procedure: LIGATION OF ARTERIOVENOUS  FISTULA UPPER ARM FISTULA;  Surgeon: [name], MD;  Location: [place];  Service: Vascular;  Laterality: Left;  . LUMBAR LAMINECTOMY  11/01  . PACEMAKER INSERTION  2012  . PROSTATE BIOPSY N/A 06/21/2013   Procedure: BIOPSY TRANSRECTAL ULTRASONIC PROSTATE (TUBP);  Surgeon: Molli Hazard, MD;  Location: [facility];  Service: Urology;  Laterality: N/A;  PROSTATE NERVE BLOCK    Allergies  Allergen Reactions  . Crestor [Rosuvastatin] Other (See Comments)    "ACHING JOINTS"  . Lipitor [Atorvastatin] Other (See Comments)    "ACHING JOINTS"  . Statins Other (See Comments)    "ACHING JOINTS"  . Penicillins Rash and Other (See Comments)    Has patient had a PCN reaction causing immediate rash, facial/tongue/throat swelling, SOB or lightheadedness with hypotension: yes Has patient had a PCN reaction causing severe rash involving mucus membranes or skin necrosis: no Has patient had a PCN reaction that required hospitalization no Has patient had a PCN reaction occurring within the last 10 years: no If all of the above answers are "NO", then may proceed with Cephalosporin use.   . Tramadol Nausea Only    Current Outpatient Prescriptions  Medication Sig Dispense Refill  . amiodarone (PACERONE) 200  MG tablet Take 1 tablet (200 mg total) by mouth 2 (two) times daily.    . carboxymethylcellulose (REFRESH PLUS) 0.5 % SOLN Place 1 drop into both eyes 3 (three) times daily as needed (for dry eyes.).    . Darbepoetin Alfa (ARANESP) 100 MCG/0.5ML SOSY injection Inject 100 mcg into the skin every 21 ( twenty-one) days.    . folic acid (FOLVITE) 937 MCG tablet Take 400 mcg by mouth daily.      . furosemide (LASIX) 80 MG tablet Take 1-2 tablets (80-160 mg total) by mouth 2 (two) times daily. Take 160 mg in the morning and 80 mg in the evening.    Marland Kitchen glimepiride (AMARYL) 1 MG tablet Take 1 tablet (1 mg total) by mouth daily before breakfast. 30 tablet 1  . Glucosamine HCl 1500 MG TABS Take 1,500 mg by  mouth 2 (two) times daily.     . Horse Chestnut 300 MG CAPS Take 300 mg by mouth daily.     Marland Kitchen HYDROcodone-acetaminophen (NORCO/VICODIN) 5-325 MG tablet Take 1 tablet by mouth every 6 (six) hours as needed. For pain. 6 tablet 0  . metoprolol succinate (TOPROL-XL) 25 MG 24 hr tablet Take 25 mg by mouth daily.    . Multiple Vitamin (MULTIVITAMIN WITH MINERALS) TABS tablet Take 1 tablet by mouth daily.    . nitroGLYCERIN (NITROSTAT) 0.4 MG SL tablet Place 0.4 mg under the tongue every 5 (five) minutes as needed for chest pain.    . Omega-3 Fatty Acids (FISH OIL) 1200 MG CAPS Take 1,200 mg by mouth 2 (two) times daily.     . Polyethylene Glycol 3350 (MIRALAX PO) Take 17 g by mouth daily.     Marland Kitchen warfarin (COUMADIN) 3 MG tablet Take 1 tablet (3 mg total) by mouth daily. (Patient taking differently: Take 4 mg by mouth every evening. )     No current facility-administered medications for this visit.     Family History  Problem Relation Age of Onset  . Cancer Mother        breast  . Diabetes Mother   . Cancer Father        prostate, esophagus    Social History   Social History  . Marital status: Married    Spouse name: N/A  . Number of children: 3  . Years of education: N/A   Occupational History  .  retired    Social History Main Topics  . Smoking status: Former Smoker    Packs/day: 0.75    Years: 24.00    Types: Cigarettes    Quit date: 03/12/1968  . Smokeless tobacco: Former Systems developer    Types: Snuff, Chew    Quit date: 03/11/1968  . Alcohol use No  . Drug use: No  . Sexual activity: Not Currently   Other Topics Concern  . Not on file   Social History Narrative  . No narrative on file     ROS: [x]  Positive   [ ]  Negative   [ ]  All sytems reviewed and are negative  Cardiac: []  chest pain/pressure []  palpitations [x]  SOB lying flat [x]  DOE [x]  irregular heart rhythm  Vascular: [x]  pain in legs while walking [x]  pain in feet when lying flat [x]  pain in feet that wake you up at night []  hx of DVT []  hx of phlebitis [x]  swelling in legs []  varicose veins  Pulmonary: []  productive cough [x]  on oxygen at home []  asthma []  wheezing  Neurologic: [x]  weakness & numbness in right hand [] difficulty speaking or slurred speech []  temporary loss of vision in one eye []  dizziness  Hematologic: []  bleeding problems []  problems with blood clotting easily  GI []  vomiting blood []  blood in stool  GU: [x]  CKD/renal failure  []  HD---[]  M/W/F []  T/T/F []  burning with urination []  blood in urine  Psychiatric: []  hx of major depression  Integumentary: []  rashes []  ulcers  Constitutional: []  fever []  chills  PHYSICAL EXAMINATION:  Vitals:   09/05/16 1512  Weight: 228 lb 9.6 oz (103.7 kg)  Height: 6\' 2"  (1.88 m)   Vitals:   09/05/16 1512  BP: 126/73  Pulse: 76  Resp: 20  Temp: 98 F (36.7 C)     General:  WDWN in NAD Gait: slow with a walker HENT: WNL Pulmonary: normal non-labored breathing , without Rales, rhonchi,  wheezing  Cardiac: regular, without  Murmurs, rubs or gallops; without carotid bruits Skin: without rashes, without ulcers  Extremities:  5/5 grip on the left; 4/5 grip on right; +thrill/bruit within fistula; incision is well  healed. Musculoskeletal: no muscle wasting or atrophy  Neurologic: A&O X 3;  Speech is fluent/normal  Non-Invasive Vascular Imaging:   Dialysis duplex 09/05/16: Diameter:  0.14cm-0.49cm Depth:  0.17cm-0.33cm (0.89cm in the fossa)    ASSESSMENT/PLAN: 83 y.o. male who is s/p right radial cephalic AV fistula creation on May 14, 2016 by Dr. Oneida Alar   - pt appears to be having some steal symptoms with decreased sensation and decrease grip on the right.  Dr. Oneida Alar had long discussion with pt that ligating the fistula would not relieve the pain in his shoulder, but could improve his sx in his hand.  Also discussed with pt that if we ligate the fistula, we would have to start over with trying to find new access.  Pt wants to wait on ligating fistula at this time.  He will see Dr. Oneida Alar back in 4 weeks.  If before then, his pain worsens or he wants to proceed with ligation, he will contact our office.  Pt understands that leaving the fistula could worsen his sx.   -pt is on coumadin for PAF   Leontine Locket, PA-C [facility] [phone]  Clinic MD:   Pt seen and examined with [facility]

## 2016-09-10 ENCOUNTER — Encounter (HOSPITAL_COMMUNITY)
Admission: RE | Admit: 2016-09-10 | Discharge: 2016-09-10 | Disposition: A | Discharge status: Home / Self Care | Payer: Medicare Other | Source: Ambulatory Visit | Attending: Nephrology | Admitting: Nephrology

## 2016-09-10 DIAGNOSIS — N184 Chronic kidney disease, stage 4 (severe): Secondary | ICD-10-CM | POA: Diagnosis not present

## 2016-09-10 DIAGNOSIS — N185 Chronic kidney disease, stage 5: Secondary | ICD-10-CM

## 2016-09-10 DIAGNOSIS — D631 Anemia in chronic kidney disease: Secondary | ICD-10-CM | POA: Insufficient documentation

## 2016-09-10 LAB — FERRITIN: FERRITIN: 600 ng/mL — AB (ref 24–336)

## 2016-09-10 LAB — POCT HEMOGLOBIN-HEMACUE: HEMOGLOBIN: 10.2 g/dL — AB (ref 13.0–17.0)

## 2016-09-10 LAB — IRON AND TIBC
IRON: 180 ug/dL (ref 45–182)
Saturation Ratios: 57 % — ABNORMAL HIGH (ref 17.9–39.5)
TIBC: 315 ug/dL (ref 250–450)
UIBC: 135 ug/dL

## 2016-09-10 MED ORDER — DARBEPOETIN ALFA 100 MCG/0.5ML IJ SOSY
100.0000 ug | PREFILLED_SYRINGE | INTRAMUSCULAR | Status: DC
  Administered 2016-09-10: 13:00:00 100 ug via SUBCUTANEOUS

## 2016-09-10 MED ORDER — DARBEPOETIN ALFA 100 MCG/0.5ML IJ SOSY
PREFILLED_SYRINGE | INTRAMUSCULAR | Status: AC
  Filled 2016-09-10: qty 0.5

## 2016-09-19 DIAGNOSIS — Z950 Presence of cardiac pacemaker: Secondary | ICD-10-CM | POA: Diagnosis not present

## 2016-09-19 DIAGNOSIS — I482 Chronic atrial fibrillation: Secondary | ICD-10-CM | POA: Diagnosis not present

## 2016-09-19 DIAGNOSIS — E785 Hyperlipidemia, unspecified: Secondary | ICD-10-CM | POA: Diagnosis not present

## 2016-09-19 DIAGNOSIS — N184 Chronic kidney disease, stage 4 (severe): Secondary | ICD-10-CM | POA: Diagnosis not present

## 2016-09-19 DIAGNOSIS — I2510 Atherosclerotic heart disease of native coronary artery without angina pectoris: Secondary | ICD-10-CM | POA: Diagnosis not present

## 2016-09-19 DIAGNOSIS — R0600 Dyspnea, unspecified: Secondary | ICD-10-CM | POA: Diagnosis not present

## 2016-09-19 DIAGNOSIS — Z951 Presence of aortocoronary bypass graft: Secondary | ICD-10-CM | POA: Diagnosis not present

## 2016-09-19 DIAGNOSIS — I480 Paroxysmal atrial fibrillation: Secondary | ICD-10-CM | POA: Diagnosis not present

## 2016-09-19 DIAGNOSIS — E1122 Type 2 diabetes mellitus with diabetic chronic kidney disease: Secondary | ICD-10-CM | POA: Diagnosis not present

## 2016-09-19 DIAGNOSIS — Z7901 Long term (current) use of anticoagulants: Secondary | ICD-10-CM | POA: Diagnosis not present

## 2016-09-19 DIAGNOSIS — I5032 Chronic diastolic (congestive) heart failure: Secondary | ICD-10-CM | POA: Diagnosis not present

## 2016-09-19 DIAGNOSIS — I119 Hypertensive heart disease without heart failure: Secondary | ICD-10-CM | POA: Diagnosis not present

## 2016-09-25 DIAGNOSIS — Z950 Presence of cardiac pacemaker: Secondary | ICD-10-CM | POA: Diagnosis not present

## 2016-09-25 DIAGNOSIS — I5032 Chronic diastolic (congestive) heart failure: Secondary | ICD-10-CM | POA: Diagnosis not present

## 2016-09-25 DIAGNOSIS — I119 Hypertensive heart disease without heart failure: Secondary | ICD-10-CM | POA: Diagnosis not present

## 2016-09-25 DIAGNOSIS — R0600 Dyspnea, unspecified: Secondary | ICD-10-CM | POA: Diagnosis not present

## 2016-09-25 DIAGNOSIS — Z951 Presence of aortocoronary bypass graft: Secondary | ICD-10-CM | POA: Diagnosis not present

## 2016-09-25 DIAGNOSIS — N184 Chronic kidney disease, stage 4 (severe): Secondary | ICD-10-CM | POA: Diagnosis not present

## 2016-09-25 DIAGNOSIS — Z7901 Long term (current) use of anticoagulants: Secondary | ICD-10-CM | POA: Diagnosis not present

## 2016-09-25 DIAGNOSIS — E785 Hyperlipidemia, unspecified: Secondary | ICD-10-CM | POA: Diagnosis not present

## 2016-09-25 DIAGNOSIS — E1122 Type 2 diabetes mellitus with diabetic chronic kidney disease: Secondary | ICD-10-CM | POA: Diagnosis not present

## 2016-09-25 DIAGNOSIS — I2510 Atherosclerotic heart disease of native coronary artery without angina pectoris: Secondary | ICD-10-CM | POA: Diagnosis not present

## 2016-09-25 DIAGNOSIS — I480 Paroxysmal atrial fibrillation: Secondary | ICD-10-CM | POA: Diagnosis not present

## 2016-09-25 DIAGNOSIS — I482 Chronic atrial fibrillation: Secondary | ICD-10-CM | POA: Diagnosis not present

## 2016-10-01 ENCOUNTER — Encounter: Payer: Self-pay | Admitting: Vascular Surgery

## 2016-10-01 ENCOUNTER — Encounter (HOSPITAL_COMMUNITY)
Admission: RE | Admit: 2016-10-01 | Discharge: 2016-10-01 | Disposition: A | Discharge status: Home / Self Care | Payer: Medicare Other | Source: Ambulatory Visit | Attending: Nephrology | Admitting: Nephrology

## 2016-10-01 DIAGNOSIS — N184 Chronic kidney disease, stage 4 (severe): Secondary | ICD-10-CM | POA: Diagnosis not present

## 2016-10-01 DIAGNOSIS — N185 Chronic kidney disease, stage 5: Secondary | ICD-10-CM

## 2016-10-01 DIAGNOSIS — D631 Anemia in chronic kidney disease: Secondary | ICD-10-CM | POA: Diagnosis not present

## 2016-10-01 LAB — POCT HEMOGLOBIN-HEMACUE: HEMOGLOBIN: 10.4 g/dL — AB (ref 13.0–17.0)

## 2016-10-01 MED ORDER — DARBEPOETIN ALFA 100 MCG/0.5ML IJ SOSY
PREFILLED_SYRINGE | INTRAMUSCULAR | Status: AC
  Filled 2016-10-01: qty 0.5

## 2016-10-01 MED ORDER — DARBEPOETIN ALFA 100 MCG/0.5ML IJ SOSY
100.0000 ug | PREFILLED_SYRINGE | INTRAMUSCULAR | Status: DC
  Administered 2016-10-01: 13:00:00 100 ug via SUBCUTANEOUS

## 2016-10-09 DIAGNOSIS — I5032 Chronic diastolic (congestive) heart failure: Secondary | ICD-10-CM | POA: Diagnosis not present

## 2016-10-09 DIAGNOSIS — Z951 Presence of aortocoronary bypass graft: Secondary | ICD-10-CM | POA: Diagnosis not present

## 2016-10-09 DIAGNOSIS — Z950 Presence of cardiac pacemaker: Secondary | ICD-10-CM | POA: Diagnosis not present

## 2016-10-09 DIAGNOSIS — I480 Paroxysmal atrial fibrillation: Secondary | ICD-10-CM | POA: Diagnosis not present

## 2016-10-09 DIAGNOSIS — I119 Hypertensive heart disease without heart failure: Secondary | ICD-10-CM | POA: Diagnosis not present

## 2016-10-09 DIAGNOSIS — I482 Chronic atrial fibrillation: Secondary | ICD-10-CM | POA: Diagnosis not present

## 2016-10-09 DIAGNOSIS — E1122 Type 2 diabetes mellitus with diabetic chronic kidney disease: Secondary | ICD-10-CM | POA: Diagnosis not present

## 2016-10-09 DIAGNOSIS — Z7901 Long term (current) use of anticoagulants: Secondary | ICD-10-CM | POA: Diagnosis not present

## 2016-10-09 DIAGNOSIS — R0600 Dyspnea, unspecified: Secondary | ICD-10-CM | POA: Diagnosis not present

## 2016-10-09 DIAGNOSIS — I2510 Atherosclerotic heart disease of native coronary artery without angina pectoris: Secondary | ICD-10-CM | POA: Diagnosis not present

## 2016-10-09 DIAGNOSIS — N184 Chronic kidney disease, stage 4 (severe): Secondary | ICD-10-CM | POA: Diagnosis not present

## 2016-10-09 DIAGNOSIS — E785 Hyperlipidemia, unspecified: Secondary | ICD-10-CM | POA: Diagnosis not present

## 2016-10-15 DIAGNOSIS — H04123 Dry eye syndrome of bilateral lacrimal glands: Secondary | ICD-10-CM | POA: Diagnosis not present

## 2016-10-15 DIAGNOSIS — H01002 Unspecified blepharitis right lower eyelid: Secondary | ICD-10-CM | POA: Diagnosis not present

## 2016-10-15 DIAGNOSIS — H01004 Unspecified blepharitis left upper eyelid: Secondary | ICD-10-CM | POA: Diagnosis not present

## 2016-10-15 DIAGNOSIS — H01001 Unspecified blepharitis right upper eyelid: Secondary | ICD-10-CM | POA: Diagnosis not present

## 2016-10-17 ENCOUNTER — Encounter: Payer: Self-pay | Admitting: Vascular Surgery

## 2016-10-17 ENCOUNTER — Ambulatory Visit (INDEPENDENT_AMBULATORY_CARE_PROVIDER_SITE_OTHER): Payer: Medicare Other | Admitting: Vascular Surgery

## 2016-10-17 VITALS — BP 134/74 | HR 84 | Temp 97.8°F | Resp 20 | Ht 74.0 in | Wt 229.4 lb

## 2016-10-17 DIAGNOSIS — N184 Chronic kidney disease, stage 4 (severe): Secondary | ICD-10-CM

## 2016-10-17 NOTE — Progress Notes (Signed)
Patient is an 83 year old male who returns for follow-up today. He recently had a right radiocephalic AV fistula placed. He was seen late in June with some steal-type symptoms. He has had a complicated access history with a previous left radiocephalic AV fistula which failed followed by a left brachiocephalic AV fistula which developed venous hypertension and required ligation. He currently is not on hemodialysis. He is followed by Dr. Mercy Moore. He states the symptoms in his right hand have improved but he still has some occasional numbness and tingling.  Review of systems: He is overall fairly debilitated and has to walk with a walker. He gets shortness of breath easily with minimal activity.  Physical exam:  Vitals:   10/17/16 1418 10/17/16 1421  BP:  134/74  Pulse:  84  Resp:  20  Temp:  97.8 F (36.6 C)  TempSrc:  Oral  SpO2:  96%  Weight: 229 lb 6.4 oz (104.1 kg) 229 lb 6.4 oz (104.1 kg)  Height:  6\' 2"  (1.88 m)    Extremities: Right upper extremity 2+ right radial pulse no audible bruit or palpable thrill in fistula  Assessment: Right radiocephalic fistula is now occluded. Steal symptoms have improved after occlusion of the fistula. The patient has now failed 3 prior access procedures and currently is not on hemodialysis.  Plan: Patient will follow-up on as-needed basis for placement of a hemodialysis access only when his starting dialysis would be imminent since he has had 3 failed attempts in the last year. At the time of his last vein mapping he did have a reasonable basilic vein in the right upper arm. If she requires dialysis in the future consideration could be given for placement of a basilic vein fistula versus a right arm AV graft. However in light of the fact that the patient had some steal symptoms with his prior radiocephalic fistula he would be high risk to develop steal again. In light of this and his multiple prior failures I would hold off on placing any further access  until the patient is either immanently or on dialysis. He will follow-up with [place] on as-needed basis.  Ruta Hinds, MD [facility] [place] Office: [phone] Pager: [phone]

## 2016-10-22 ENCOUNTER — Inpatient Hospital Stay (HOSPITAL_COMMUNITY): Admission: RE | Admit: 2016-10-22 | Payer: Medicare Other | Source: Ambulatory Visit

## 2016-10-22 ENCOUNTER — Encounter (HOSPITAL_COMMUNITY)
Admission: RE | Admit: 2016-10-22 | Discharge: 2016-10-22 | Disposition: A | Discharge status: Home / Self Care | Payer: Medicare Other | Source: Ambulatory Visit | Attending: Nephrology | Admitting: Nephrology

## 2016-10-22 DIAGNOSIS — N184 Chronic kidney disease, stage 4 (severe): Secondary | ICD-10-CM | POA: Insufficient documentation

## 2016-10-22 DIAGNOSIS — D638 Anemia in other chronic diseases classified elsewhere: Secondary | ICD-10-CM | POA: Insufficient documentation

## 2016-10-22 DIAGNOSIS — N185 Chronic kidney disease, stage 5: Secondary | ICD-10-CM

## 2016-10-22 LAB — FERRITIN: Ferritin: 624 ng/mL — ABNORMAL HIGH (ref 24–336)

## 2016-10-22 LAB — POCT HEMOGLOBIN-HEMACUE: HEMOGLOBIN: 9.7 g/dL — AB (ref 13.0–17.0)

## 2016-10-22 LAB — IRON AND TIBC
IRON: 157 ug/dL (ref 45–182)
Saturation Ratios: 51 % — ABNORMAL HIGH (ref 17.9–39.5)
TIBC: 307 ug/dL (ref 250–450)
UIBC: 150 ug/dL

## 2016-10-22 MED ORDER — DARBEPOETIN ALFA 100 MCG/0.5ML IJ SOSY
PREFILLED_SYRINGE | INTRAMUSCULAR | Status: AC
  Filled 2016-10-22: qty 0.5

## 2016-10-22 MED ORDER — DARBEPOETIN ALFA 100 MCG/0.5ML IJ SOSY
100.0000 ug | PREFILLED_SYRINGE | INTRAMUSCULAR | Status: DC
  Administered 2016-10-22: 13:00:00 100 ug via SUBCUTANEOUS

## 2016-10-23 DIAGNOSIS — Z7901 Long term (current) use of anticoagulants: Secondary | ICD-10-CM | POA: Diagnosis not present

## 2016-10-23 DIAGNOSIS — I2510 Atherosclerotic heart disease of native coronary artery without angina pectoris: Secondary | ICD-10-CM | POA: Diagnosis not present

## 2016-10-23 DIAGNOSIS — I119 Hypertensive heart disease without heart failure: Secondary | ICD-10-CM | POA: Diagnosis not present

## 2016-10-23 DIAGNOSIS — I5032 Chronic diastolic (congestive) heart failure: Secondary | ICD-10-CM | POA: Diagnosis not present

## 2016-10-23 DIAGNOSIS — I482 Chronic atrial fibrillation: Secondary | ICD-10-CM | POA: Diagnosis not present

## 2016-10-23 DIAGNOSIS — Z951 Presence of aortocoronary bypass graft: Secondary | ICD-10-CM | POA: Diagnosis not present

## 2016-10-23 DIAGNOSIS — Z950 Presence of cardiac pacemaker: Secondary | ICD-10-CM | POA: Diagnosis not present

## 2016-10-23 DIAGNOSIS — E785 Hyperlipidemia, unspecified: Secondary | ICD-10-CM | POA: Diagnosis not present

## 2016-10-23 DIAGNOSIS — R0600 Dyspnea, unspecified: Secondary | ICD-10-CM | POA: Diagnosis not present

## 2016-10-23 DIAGNOSIS — E1122 Type 2 diabetes mellitus with diabetic chronic kidney disease: Secondary | ICD-10-CM | POA: Diagnosis not present

## 2016-10-23 DIAGNOSIS — N184 Chronic kidney disease, stage 4 (severe): Secondary | ICD-10-CM | POA: Diagnosis not present

## 2016-10-23 DIAGNOSIS — I480 Paroxysmal atrial fibrillation: Secondary | ICD-10-CM | POA: Diagnosis not present

## 2016-10-28 ENCOUNTER — Encounter (HOSPITAL_COMMUNITY): Payer: Medicare Other

## 2016-10-30 NOTE — Addendum Note (Signed)
Addendum  created 10/30/16 1212 by Albertha Ghee, MD   Sign clinical note

## 2016-10-31 DIAGNOSIS — E1129 Type 2 diabetes mellitus with other diabetic kidney complication: Secondary | ICD-10-CM | POA: Diagnosis not present

## 2016-10-31 DIAGNOSIS — N184 Chronic kidney disease, stage 4 (severe): Secondary | ICD-10-CM | POA: Diagnosis not present

## 2016-10-31 DIAGNOSIS — I129 Hypertensive chronic kidney disease with stage 1 through stage 4 chronic kidney disease, or unspecified chronic kidney disease: Secondary | ICD-10-CM | POA: Diagnosis not present

## 2016-10-31 DIAGNOSIS — Z6831 Body mass index (BMI) 31.0-31.9, adult: Secondary | ICD-10-CM | POA: Diagnosis not present

## 2016-10-31 DIAGNOSIS — Z6832 Body mass index (BMI) 32.0-32.9, adult: Secondary | ICD-10-CM | POA: Diagnosis not present

## 2016-10-31 DIAGNOSIS — D631 Anemia in chronic kidney disease: Secondary | ICD-10-CM | POA: Diagnosis not present

## 2016-11-05 DIAGNOSIS — I482 Chronic atrial fibrillation: Secondary | ICD-10-CM | POA: Diagnosis not present

## 2016-11-05 DIAGNOSIS — I2510 Atherosclerotic heart disease of native coronary artery without angina pectoris: Secondary | ICD-10-CM | POA: Diagnosis not present

## 2016-11-05 DIAGNOSIS — R0600 Dyspnea, unspecified: Secondary | ICD-10-CM | POA: Diagnosis not present

## 2016-11-05 DIAGNOSIS — N184 Chronic kidney disease, stage 4 (severe): Secondary | ICD-10-CM | POA: Diagnosis not present

## 2016-11-05 DIAGNOSIS — I5032 Chronic diastolic (congestive) heart failure: Secondary | ICD-10-CM | POA: Diagnosis not present

## 2016-11-05 DIAGNOSIS — E785 Hyperlipidemia, unspecified: Secondary | ICD-10-CM | POA: Diagnosis not present

## 2016-11-05 DIAGNOSIS — Z7901 Long term (current) use of anticoagulants: Secondary | ICD-10-CM | POA: Diagnosis not present

## 2016-11-05 DIAGNOSIS — Z950 Presence of cardiac pacemaker: Secondary | ICD-10-CM | POA: Diagnosis not present

## 2016-11-05 DIAGNOSIS — Z951 Presence of aortocoronary bypass graft: Secondary | ICD-10-CM | POA: Diagnosis not present

## 2016-11-05 DIAGNOSIS — I119 Hypertensive heart disease without heart failure: Secondary | ICD-10-CM | POA: Diagnosis not present

## 2016-11-05 DIAGNOSIS — E1122 Type 2 diabetes mellitus with diabetic chronic kidney disease: Secondary | ICD-10-CM | POA: Diagnosis not present

## 2016-11-05 DIAGNOSIS — I480 Paroxysmal atrial fibrillation: Secondary | ICD-10-CM | POA: Diagnosis not present

## 2016-11-12 ENCOUNTER — Ambulatory Visit (HOSPITAL_COMMUNITY)
Admission: RE | Admit: 2016-11-12 | Discharge: 2016-11-12 | Disposition: A | Discharge status: Home / Self Care | Payer: Medicare Other | Source: Ambulatory Visit | Attending: Nephrology | Admitting: Nephrology

## 2016-11-12 DIAGNOSIS — N185 Chronic kidney disease, stage 5: Secondary | ICD-10-CM | POA: Insufficient documentation

## 2016-11-12 LAB — POCT HEMOGLOBIN-HEMACUE: Hemoglobin: 10 g/dL — ABNORMAL LOW (ref 13.0–17.0)

## 2016-11-12 MED ORDER — DARBEPOETIN ALFA 100 MCG/0.5ML IJ SOSY
100.0000 ug | PREFILLED_SYRINGE | INTRAMUSCULAR | Status: DC
  Administered 2016-11-12: 100 ug via SUBCUTANEOUS

## 2016-11-12 MED ORDER — DARBEPOETIN ALFA 100 MCG/0.5ML IJ SOSY
PREFILLED_SYRINGE | INTRAMUSCULAR | Status: AC
  Administered 2016-11-12: 100 ug via SUBCUTANEOUS
  Filled 2016-11-12: qty 0.5

## 2016-11-18 DIAGNOSIS — I25810 Atherosclerosis of coronary artery bypass graft(s) without angina pectoris: Secondary | ICD-10-CM | POA: Diagnosis not present

## 2016-11-18 DIAGNOSIS — N184 Chronic kidney disease, stage 4 (severe): Secondary | ICD-10-CM | POA: Diagnosis not present

## 2016-11-18 DIAGNOSIS — E1122 Type 2 diabetes mellitus with diabetic chronic kidney disease: Secondary | ICD-10-CM | POA: Diagnosis not present

## 2016-11-18 DIAGNOSIS — D509 Iron deficiency anemia, unspecified: Secondary | ICD-10-CM | POA: Diagnosis not present

## 2016-11-18 DIAGNOSIS — Z6831 Body mass index (BMI) 31.0-31.9, adult: Secondary | ICD-10-CM | POA: Diagnosis not present

## 2016-11-18 DIAGNOSIS — Z23 Encounter for immunization: Secondary | ICD-10-CM | POA: Diagnosis not present

## 2016-11-26 DIAGNOSIS — N184 Chronic kidney disease, stage 4 (severe): Secondary | ICD-10-CM | POA: Diagnosis not present

## 2016-11-26 DIAGNOSIS — I480 Paroxysmal atrial fibrillation: Secondary | ICD-10-CM | POA: Diagnosis not present

## 2016-11-26 DIAGNOSIS — E1122 Type 2 diabetes mellitus with diabetic chronic kidney disease: Secondary | ICD-10-CM | POA: Diagnosis not present

## 2016-11-26 DIAGNOSIS — Z7901 Long term (current) use of anticoagulants: Secondary | ICD-10-CM | POA: Diagnosis not present

## 2016-11-26 DIAGNOSIS — I119 Hypertensive heart disease without heart failure: Secondary | ICD-10-CM | POA: Diagnosis not present

## 2016-11-26 DIAGNOSIS — I5032 Chronic diastolic (congestive) heart failure: Secondary | ICD-10-CM | POA: Diagnosis not present

## 2016-11-26 DIAGNOSIS — R0600 Dyspnea, unspecified: Secondary | ICD-10-CM | POA: Diagnosis not present

## 2016-11-26 DIAGNOSIS — Z950 Presence of cardiac pacemaker: Secondary | ICD-10-CM | POA: Diagnosis not present

## 2016-11-26 DIAGNOSIS — I482 Chronic atrial fibrillation: Secondary | ICD-10-CM | POA: Diagnosis not present

## 2016-11-26 DIAGNOSIS — Z951 Presence of aortocoronary bypass graft: Secondary | ICD-10-CM | POA: Diagnosis not present

## 2016-11-26 DIAGNOSIS — E785 Hyperlipidemia, unspecified: Secondary | ICD-10-CM | POA: Diagnosis not present

## 2016-11-26 DIAGNOSIS — I2510 Atherosclerotic heart disease of native coronary artery without angina pectoris: Secondary | ICD-10-CM | POA: Diagnosis not present

## 2016-12-03 ENCOUNTER — Encounter (HOSPITAL_COMMUNITY)
Admission: RE | Admit: 2016-12-03 | Discharge: 2016-12-03 | Disposition: A | Discharge status: Home / Self Care | Payer: Medicare Other | Source: Ambulatory Visit | Attending: Nephrology | Admitting: Nephrology

## 2016-12-03 DIAGNOSIS — D638 Anemia in other chronic diseases classified elsewhere: Secondary | ICD-10-CM | POA: Diagnosis not present

## 2016-12-03 DIAGNOSIS — N184 Chronic kidney disease, stage 4 (severe): Secondary | ICD-10-CM | POA: Diagnosis not present

## 2016-12-03 DIAGNOSIS — N185 Chronic kidney disease, stage 5: Secondary | ICD-10-CM

## 2016-12-03 LAB — FERRITIN: FERRITIN: 604 ng/mL — AB (ref 24–336)

## 2016-12-03 LAB — IRON AND TIBC
IRON: 119 ug/dL (ref 45–182)
Saturation Ratios: 38 % (ref 17.9–39.5)
TIBC: 311 ug/dL (ref 250–450)
UIBC: 192 ug/dL

## 2016-12-03 LAB — POCT HEMOGLOBIN-HEMACUE: HEMOGLOBIN: 9.9 g/dL — AB (ref 13.0–17.0)

## 2016-12-03 MED ORDER — DARBEPOETIN ALFA 100 MCG/0.5ML IJ SOSY
100.0000 ug | PREFILLED_SYRINGE | INTRAMUSCULAR | Status: DC
  Administered 2016-12-03: 100 ug via SUBCUTANEOUS

## 2016-12-03 MED ORDER — DARBEPOETIN ALFA 100 MCG/0.5ML IJ SOSY
PREFILLED_SYRINGE | INTRAMUSCULAR | Status: AC
  Filled 2016-12-03: qty 0.5

## 2016-12-10 DIAGNOSIS — Z950 Presence of cardiac pacemaker: Secondary | ICD-10-CM | POA: Diagnosis not present

## 2016-12-10 DIAGNOSIS — Z951 Presence of aortocoronary bypass graft: Secondary | ICD-10-CM | POA: Diagnosis not present

## 2016-12-10 DIAGNOSIS — Z7901 Long term (current) use of anticoagulants: Secondary | ICD-10-CM | POA: Diagnosis not present

## 2016-12-10 DIAGNOSIS — I2510 Atherosclerotic heart disease of native coronary artery without angina pectoris: Secondary | ICD-10-CM | POA: Diagnosis not present

## 2016-12-10 DIAGNOSIS — R0600 Dyspnea, unspecified: Secondary | ICD-10-CM | POA: Diagnosis not present

## 2016-12-10 DIAGNOSIS — N184 Chronic kidney disease, stage 4 (severe): Secondary | ICD-10-CM | POA: Diagnosis not present

## 2016-12-10 DIAGNOSIS — E785 Hyperlipidemia, unspecified: Secondary | ICD-10-CM | POA: Diagnosis not present

## 2016-12-10 DIAGNOSIS — I480 Paroxysmal atrial fibrillation: Secondary | ICD-10-CM | POA: Diagnosis not present

## 2016-12-10 DIAGNOSIS — I482 Chronic atrial fibrillation: Secondary | ICD-10-CM | POA: Diagnosis not present

## 2016-12-10 DIAGNOSIS — I119 Hypertensive heart disease without heart failure: Secondary | ICD-10-CM | POA: Diagnosis not present

## 2016-12-10 DIAGNOSIS — I5032 Chronic diastolic (congestive) heart failure: Secondary | ICD-10-CM | POA: Diagnosis not present

## 2016-12-10 DIAGNOSIS — E1122 Type 2 diabetes mellitus with diabetic chronic kidney disease: Secondary | ICD-10-CM | POA: Diagnosis not present

## 2016-12-19 DIAGNOSIS — I480 Paroxysmal atrial fibrillation: Secondary | ICD-10-CM | POA: Diagnosis not present

## 2016-12-19 DIAGNOSIS — E1122 Type 2 diabetes mellitus with diabetic chronic kidney disease: Secondary | ICD-10-CM | POA: Diagnosis not present

## 2016-12-19 DIAGNOSIS — I2510 Atherosclerotic heart disease of native coronary artery without angina pectoris: Secondary | ICD-10-CM | POA: Diagnosis not present

## 2016-12-19 DIAGNOSIS — I119 Hypertensive heart disease without heart failure: Secondary | ICD-10-CM | POA: Diagnosis not present

## 2016-12-19 DIAGNOSIS — R0600 Dyspnea, unspecified: Secondary | ICD-10-CM | POA: Diagnosis not present

## 2016-12-19 DIAGNOSIS — E785 Hyperlipidemia, unspecified: Secondary | ICD-10-CM | POA: Diagnosis not present

## 2016-12-19 DIAGNOSIS — I5032 Chronic diastolic (congestive) heart failure: Secondary | ICD-10-CM | POA: Diagnosis not present

## 2016-12-19 DIAGNOSIS — I482 Chronic atrial fibrillation: Secondary | ICD-10-CM | POA: Diagnosis not present

## 2016-12-19 DIAGNOSIS — N184 Chronic kidney disease, stage 4 (severe): Secondary | ICD-10-CM | POA: Diagnosis not present

## 2016-12-19 DIAGNOSIS — Z951 Presence of aortocoronary bypass graft: Secondary | ICD-10-CM | POA: Diagnosis not present

## 2016-12-19 DIAGNOSIS — Z950 Presence of cardiac pacemaker: Secondary | ICD-10-CM | POA: Diagnosis not present

## 2016-12-19 DIAGNOSIS — Z7901 Long term (current) use of anticoagulants: Secondary | ICD-10-CM | POA: Diagnosis not present

## 2016-12-24 DIAGNOSIS — Z950 Presence of cardiac pacemaker: Secondary | ICD-10-CM | POA: Diagnosis not present

## 2016-12-24 DIAGNOSIS — I480 Paroxysmal atrial fibrillation: Secondary | ICD-10-CM | POA: Diagnosis not present

## 2016-12-24 DIAGNOSIS — R0600 Dyspnea, unspecified: Secondary | ICD-10-CM | POA: Diagnosis not present

## 2016-12-24 DIAGNOSIS — Z7901 Long term (current) use of anticoagulants: Secondary | ICD-10-CM | POA: Diagnosis not present

## 2016-12-24 DIAGNOSIS — E785 Hyperlipidemia, unspecified: Secondary | ICD-10-CM | POA: Diagnosis not present

## 2016-12-24 DIAGNOSIS — I482 Chronic atrial fibrillation: Secondary | ICD-10-CM | POA: Diagnosis not present

## 2016-12-24 DIAGNOSIS — I119 Hypertensive heart disease without heart failure: Secondary | ICD-10-CM | POA: Diagnosis not present

## 2016-12-24 DIAGNOSIS — I2510 Atherosclerotic heart disease of native coronary artery without angina pectoris: Secondary | ICD-10-CM | POA: Diagnosis not present

## 2016-12-24 DIAGNOSIS — I5032 Chronic diastolic (congestive) heart failure: Secondary | ICD-10-CM | POA: Diagnosis not present

## 2016-12-24 DIAGNOSIS — Z951 Presence of aortocoronary bypass graft: Secondary | ICD-10-CM | POA: Diagnosis not present

## 2016-12-24 DIAGNOSIS — N184 Chronic kidney disease, stage 4 (severe): Secondary | ICD-10-CM | POA: Diagnosis not present

## 2016-12-24 DIAGNOSIS — E1122 Type 2 diabetes mellitus with diabetic chronic kidney disease: Secondary | ICD-10-CM | POA: Diagnosis not present

## 2016-12-25 ENCOUNTER — Encounter (HOSPITAL_COMMUNITY)
Admission: RE | Admit: 2016-12-25 | Discharge: 2016-12-25 | Disposition: A | Discharge status: Home / Self Care | Payer: Medicare Other | Source: Ambulatory Visit | Attending: Nephrology | Admitting: Nephrology

## 2016-12-25 DIAGNOSIS — D631 Anemia in chronic kidney disease: Secondary | ICD-10-CM | POA: Diagnosis not present

## 2016-12-25 DIAGNOSIS — N184 Chronic kidney disease, stage 4 (severe): Secondary | ICD-10-CM | POA: Diagnosis not present

## 2016-12-25 DIAGNOSIS — N185 Chronic kidney disease, stage 5: Secondary | ICD-10-CM

## 2016-12-25 LAB — POCT HEMOGLOBIN-HEMACUE: HEMOGLOBIN: 10.1 g/dL — AB (ref 13.0–17.0)

## 2016-12-25 MED ORDER — DARBEPOETIN ALFA 100 MCG/0.5ML IJ SOSY
PREFILLED_SYRINGE | INTRAMUSCULAR | Status: AC
  Administered 2016-12-25: 13:00:00 100 ug via SUBCUTANEOUS
  Filled 2016-12-25: qty 0.5

## 2016-12-25 MED ORDER — DARBEPOETIN ALFA 100 MCG/0.5ML IJ SOSY
100.0000 ug | PREFILLED_SYRINGE | INTRAMUSCULAR | Status: DC
  Administered 2016-12-25: 100 ug via SUBCUTANEOUS

## 2017-01-01 DIAGNOSIS — L570 Actinic keratosis: Secondary | ICD-10-CM | POA: Diagnosis not present

## 2017-01-01 DIAGNOSIS — L820 Inflamed seborrheic keratosis: Secondary | ICD-10-CM | POA: Diagnosis not present

## 2017-01-01 DIAGNOSIS — D485 Neoplasm of uncertain behavior of skin: Secondary | ICD-10-CM | POA: Diagnosis not present

## 2017-01-01 DIAGNOSIS — Z85828 Personal history of other malignant neoplasm of skin: Secondary | ICD-10-CM | POA: Diagnosis not present

## 2017-01-01 DIAGNOSIS — C4441 Basal cell carcinoma of skin of scalp and neck: Secondary | ICD-10-CM | POA: Diagnosis not present

## 2017-01-01 DIAGNOSIS — D045 Carcinoma in situ of skin of trunk: Secondary | ICD-10-CM | POA: Diagnosis not present

## 2017-01-07 DIAGNOSIS — E785 Hyperlipidemia, unspecified: Secondary | ICD-10-CM | POA: Diagnosis not present

## 2017-01-07 DIAGNOSIS — Z7901 Long term (current) use of anticoagulants: Secondary | ICD-10-CM | POA: Diagnosis not present

## 2017-01-07 DIAGNOSIS — I5032 Chronic diastolic (congestive) heart failure: Secondary | ICD-10-CM | POA: Diagnosis not present

## 2017-01-07 DIAGNOSIS — I119 Hypertensive heart disease without heart failure: Secondary | ICD-10-CM | POA: Diagnosis not present

## 2017-01-07 DIAGNOSIS — E1122 Type 2 diabetes mellitus with diabetic chronic kidney disease: Secondary | ICD-10-CM | POA: Diagnosis not present

## 2017-01-07 DIAGNOSIS — Z951 Presence of aortocoronary bypass graft: Secondary | ICD-10-CM | POA: Diagnosis not present

## 2017-01-07 DIAGNOSIS — I480 Paroxysmal atrial fibrillation: Secondary | ICD-10-CM | POA: Diagnosis not present

## 2017-01-07 DIAGNOSIS — I482 Chronic atrial fibrillation: Secondary | ICD-10-CM | POA: Diagnosis not present

## 2017-01-07 DIAGNOSIS — N184 Chronic kidney disease, stage 4 (severe): Secondary | ICD-10-CM | POA: Diagnosis not present

## 2017-01-07 DIAGNOSIS — I2510 Atherosclerotic heart disease of native coronary artery without angina pectoris: Secondary | ICD-10-CM | POA: Diagnosis not present

## 2017-01-07 DIAGNOSIS — R0600 Dyspnea, unspecified: Secondary | ICD-10-CM | POA: Diagnosis not present

## 2017-01-07 DIAGNOSIS — Z950 Presence of cardiac pacemaker: Secondary | ICD-10-CM | POA: Diagnosis not present

## 2017-01-08 DIAGNOSIS — N184 Chronic kidney disease, stage 4 (severe): Secondary | ICD-10-CM | POA: Diagnosis not present

## 2017-01-08 DIAGNOSIS — I129 Hypertensive chronic kidney disease with stage 1 through stage 4 chronic kidney disease, or unspecified chronic kidney disease: Secondary | ICD-10-CM | POA: Diagnosis not present

## 2017-01-08 DIAGNOSIS — M792 Neuralgia and neuritis, unspecified: Secondary | ICD-10-CM | POA: Diagnosis not present

## 2017-01-08 DIAGNOSIS — Z6831 Body mass index (BMI) 31.0-31.9, adult: Secondary | ICD-10-CM | POA: Diagnosis not present

## 2017-01-08 DIAGNOSIS — R350 Frequency of micturition: Secondary | ICD-10-CM | POA: Diagnosis not present

## 2017-01-08 DIAGNOSIS — D631 Anemia in chronic kidney disease: Secondary | ICD-10-CM | POA: Diagnosis not present

## 2017-01-08 DIAGNOSIS — E1129 Type 2 diabetes mellitus with other diabetic kidney complication: Secondary | ICD-10-CM | POA: Diagnosis not present

## 2017-01-15 ENCOUNTER — Ambulatory Visit (HOSPITAL_COMMUNITY)
Admission: RE | Admit: 2017-01-15 | Discharge: 2017-01-15 | Disposition: A | Discharge status: Home / Self Care | Payer: Medicare Other | Source: Ambulatory Visit | Attending: Nephrology | Admitting: Nephrology

## 2017-01-15 VITALS — BP 119/67 | HR 80 | Temp 97.8°F | Resp 20

## 2017-01-15 DIAGNOSIS — N184 Chronic kidney disease, stage 4 (severe): Secondary | ICD-10-CM | POA: Insufficient documentation

## 2017-01-15 DIAGNOSIS — D631 Anemia in chronic kidney disease: Secondary | ICD-10-CM | POA: Diagnosis not present

## 2017-01-15 DIAGNOSIS — N185 Chronic kidney disease, stage 5: Secondary | ICD-10-CM

## 2017-01-15 LAB — RENAL FUNCTION PANEL
ALBUMIN: 3.2 g/dL — AB (ref 3.5–5.0)
ANION GAP: 12 (ref 5–15)
BUN: 85 mg/dL — AB (ref 6–20)
CO2: 28 mmol/L (ref 22–32)
Calcium: 9.6 mg/dL (ref 8.9–10.3)
Chloride: 96 mmol/L — ABNORMAL LOW (ref 101–111)
Creatinine, Ser: 2.6 mg/dL — ABNORMAL HIGH (ref 0.61–1.24)
GFR calc Af Amer: 24 mL/min — ABNORMAL LOW (ref >60)
GFR, EST NON AFRICAN AMERICAN: 20 mL/min — AB (ref >60)
GLUCOSE: 217 mg/dL — AB (ref 65–99)
PHOSPHORUS: 3.9 mg/dL (ref 2.5–4.6)
POTASSIUM: 4.3 mmol/L (ref 3.5–5.1)
Sodium: 136 mmol/L (ref 135–145)

## 2017-01-15 LAB — FERRITIN: FERRITIN: 670 ng/mL — AB (ref 24–336)

## 2017-01-15 LAB — IRON AND TIBC
IRON: 165 ug/dL (ref 45–182)
Saturation Ratios: 55 % — ABNORMAL HIGH (ref 17.9–39.5)
TIBC: 301 ug/dL (ref 250–450)
UIBC: 136 ug/dL

## 2017-01-15 LAB — POCT HEMOGLOBIN-HEMACUE: HEMOGLOBIN: 10.4 g/dL — AB (ref 13.0–17.0)

## 2017-01-15 MED ORDER — DARBEPOETIN ALFA 100 MCG/0.5ML IJ SOSY
100.0000 ug | PREFILLED_SYRINGE | INTRAMUSCULAR | Status: DC
  Administered 2017-01-15: 100 ug via SUBCUTANEOUS

## 2017-01-15 MED ORDER — DARBEPOETIN ALFA 100 MCG/0.5ML IJ SOSY
PREFILLED_SYRINGE | INTRAMUSCULAR | Status: AC
  Filled 2017-01-15: qty 0.5

## 2017-01-16 LAB — PTH, INTACT AND CALCIUM
Calcium, Total (PTH): 9.6 mg/dL (ref 8.6–10.2)
PTH: 73 pg/mL — AB (ref 15–65)

## 2017-01-21 DIAGNOSIS — H04123 Dry eye syndrome of bilateral lacrimal glands: Secondary | ICD-10-CM | POA: Diagnosis not present

## 2017-01-21 DIAGNOSIS — H0100A Unspecified blepharitis right eye, upper and lower eyelids: Secondary | ICD-10-CM | POA: Diagnosis not present

## 2017-01-21 DIAGNOSIS — H0100B Unspecified blepharitis left eye, upper and lower eyelids: Secondary | ICD-10-CM | POA: Diagnosis not present

## 2017-01-27 DIAGNOSIS — N184 Chronic kidney disease, stage 4 (severe): Secondary | ICD-10-CM | POA: Diagnosis not present

## 2017-01-27 DIAGNOSIS — I119 Hypertensive heart disease without heart failure: Secondary | ICD-10-CM | POA: Diagnosis not present

## 2017-01-27 DIAGNOSIS — E785 Hyperlipidemia, unspecified: Secondary | ICD-10-CM | POA: Diagnosis not present

## 2017-01-27 DIAGNOSIS — I2510 Atherosclerotic heart disease of native coronary artery without angina pectoris: Secondary | ICD-10-CM | POA: Diagnosis not present

## 2017-01-27 DIAGNOSIS — Z950 Presence of cardiac pacemaker: Secondary | ICD-10-CM | POA: Diagnosis not present

## 2017-01-27 DIAGNOSIS — I5032 Chronic diastolic (congestive) heart failure: Secondary | ICD-10-CM | POA: Diagnosis not present

## 2017-01-27 DIAGNOSIS — R0600 Dyspnea, unspecified: Secondary | ICD-10-CM | POA: Diagnosis not present

## 2017-01-27 DIAGNOSIS — Z951 Presence of aortocoronary bypass graft: Secondary | ICD-10-CM | POA: Diagnosis not present

## 2017-01-27 DIAGNOSIS — E1122 Type 2 diabetes mellitus with diabetic chronic kidney disease: Secondary | ICD-10-CM | POA: Diagnosis not present

## 2017-01-27 DIAGNOSIS — Z7901 Long term (current) use of anticoagulants: Secondary | ICD-10-CM | POA: Diagnosis not present

## 2017-01-27 DIAGNOSIS — I482 Chronic atrial fibrillation: Secondary | ICD-10-CM | POA: Diagnosis not present

## 2017-01-27 DIAGNOSIS — I480 Paroxysmal atrial fibrillation: Secondary | ICD-10-CM | POA: Diagnosis not present

## 2017-02-04 ENCOUNTER — Other Ambulatory Visit (HOSPITAL_COMMUNITY): Payer: Self-pay | Admitting: *Deleted

## 2017-02-04 DIAGNOSIS — Z951 Presence of aortocoronary bypass graft: Secondary | ICD-10-CM | POA: Diagnosis not present

## 2017-02-04 DIAGNOSIS — Z950 Presence of cardiac pacemaker: Secondary | ICD-10-CM | POA: Diagnosis not present

## 2017-02-04 DIAGNOSIS — I5032 Chronic diastolic (congestive) heart failure: Secondary | ICD-10-CM | POA: Diagnosis not present

## 2017-02-04 DIAGNOSIS — I2510 Atherosclerotic heart disease of native coronary artery without angina pectoris: Secondary | ICD-10-CM | POA: Diagnosis not present

## 2017-02-04 DIAGNOSIS — Z7901 Long term (current) use of anticoagulants: Secondary | ICD-10-CM | POA: Diagnosis not present

## 2017-02-04 DIAGNOSIS — E1122 Type 2 diabetes mellitus with diabetic chronic kidney disease: Secondary | ICD-10-CM | POA: Diagnosis not present

## 2017-02-04 DIAGNOSIS — E7849 Other hyperlipidemia: Secondary | ICD-10-CM | POA: Diagnosis not present

## 2017-02-04 DIAGNOSIS — I482 Chronic atrial fibrillation: Secondary | ICD-10-CM | POA: Diagnosis not present

## 2017-02-04 DIAGNOSIS — I119 Hypertensive heart disease without heart failure: Secondary | ICD-10-CM | POA: Diagnosis not present

## 2017-02-04 DIAGNOSIS — N184 Chronic kidney disease, stage 4 (severe): Secondary | ICD-10-CM | POA: Diagnosis not present

## 2017-02-04 DIAGNOSIS — R0602 Shortness of breath: Secondary | ICD-10-CM | POA: Diagnosis not present

## 2017-02-05 ENCOUNTER — Ambulatory Visit (HOSPITAL_COMMUNITY)
Admission: RE | Admit: 2017-02-05 | Discharge: 2017-02-05 | Disposition: A | Discharge status: Home / Self Care | Payer: Medicare Other | Source: Ambulatory Visit | Attending: Nephrology | Admitting: Nephrology

## 2017-02-05 VITALS — BP 121/81 | HR 78 | Temp 97.4°F | Resp 18

## 2017-02-05 DIAGNOSIS — N184 Chronic kidney disease, stage 4 (severe): Secondary | ICD-10-CM | POA: Diagnosis not present

## 2017-02-05 DIAGNOSIS — D631 Anemia in chronic kidney disease: Secondary | ICD-10-CM | POA: Diagnosis not present

## 2017-02-05 DIAGNOSIS — N185 Chronic kidney disease, stage 5: Secondary | ICD-10-CM

## 2017-02-05 LAB — POCT HEMOGLOBIN-HEMACUE: HEMOGLOBIN: 9.8 g/dL — AB (ref 13.0–17.0)

## 2017-02-05 MED ORDER — DARBEPOETIN ALFA 100 MCG/0.5ML IJ SOSY
PREFILLED_SYRINGE | INTRAMUSCULAR | Status: AC
  Filled 2017-02-05: qty 0.5

## 2017-02-05 MED ORDER — DARBEPOETIN ALFA 100 MCG/0.5ML IJ SOSY
100.0000 ug | PREFILLED_SYRINGE | INTRAMUSCULAR | Status: DC
  Administered 2017-02-05: 13:00:00 100 ug via SUBCUTANEOUS

## 2017-02-24 DIAGNOSIS — Z950 Presence of cardiac pacemaker: Secondary | ICD-10-CM | POA: Diagnosis not present

## 2017-02-24 DIAGNOSIS — R0602 Shortness of breath: Secondary | ICD-10-CM | POA: Diagnosis not present

## 2017-02-24 DIAGNOSIS — I482 Chronic atrial fibrillation: Secondary | ICD-10-CM | POA: Diagnosis not present

## 2017-02-24 DIAGNOSIS — I119 Hypertensive heart disease without heart failure: Secondary | ICD-10-CM | POA: Diagnosis not present

## 2017-02-24 DIAGNOSIS — I2510 Atherosclerotic heart disease of native coronary artery without angina pectoris: Secondary | ICD-10-CM | POA: Diagnosis not present

## 2017-02-24 DIAGNOSIS — E1122 Type 2 diabetes mellitus with diabetic chronic kidney disease: Secondary | ICD-10-CM | POA: Diagnosis not present

## 2017-02-24 DIAGNOSIS — I5032 Chronic diastolic (congestive) heart failure: Secondary | ICD-10-CM | POA: Diagnosis not present

## 2017-02-24 DIAGNOSIS — Z7901 Long term (current) use of anticoagulants: Secondary | ICD-10-CM | POA: Diagnosis not present

## 2017-02-24 DIAGNOSIS — N184 Chronic kidney disease, stage 4 (severe): Secondary | ICD-10-CM | POA: Diagnosis not present

## 2017-02-24 DIAGNOSIS — E7849 Other hyperlipidemia: Secondary | ICD-10-CM | POA: Diagnosis not present

## 2017-02-24 DIAGNOSIS — Z951 Presence of aortocoronary bypass graft: Secondary | ICD-10-CM | POA: Diagnosis not present

## 2017-02-26 ENCOUNTER — Encounter (HOSPITAL_COMMUNITY)
Admission: RE | Admit: 2017-02-26 | Discharge: 2017-02-26 | Disposition: A | Discharge status: Home / Self Care | Payer: Medicare Other | Source: Ambulatory Visit | Attending: Nephrology | Admitting: Nephrology

## 2017-02-26 VITALS — BP 112/74 | HR 87 | Resp 18

## 2017-02-26 DIAGNOSIS — D638 Anemia in other chronic diseases classified elsewhere: Secondary | ICD-10-CM | POA: Insufficient documentation

## 2017-02-26 DIAGNOSIS — N184 Chronic kidney disease, stage 4 (severe): Secondary | ICD-10-CM | POA: Diagnosis not present

## 2017-02-26 DIAGNOSIS — N185 Chronic kidney disease, stage 5: Secondary | ICD-10-CM

## 2017-02-26 LAB — IRON AND TIBC
IRON: 177 ug/dL (ref 45–182)
SATURATION RATIOS: 52 % — AB (ref 17.9–39.5)
TIBC: 343 ug/dL (ref 250–450)
UIBC: 166 ug/dL

## 2017-02-26 LAB — RENAL FUNCTION PANEL
Albumin: 3.3 g/dL — ABNORMAL LOW (ref 3.5–5.0)
Anion gap: 12 (ref 5–15)
BUN: 93 mg/dL — AB (ref 6–20)
CHLORIDE: 93 mmol/L — AB (ref 101–111)
CO2: 25 mmol/L (ref 22–32)
CREATININE: 2.69 mg/dL — AB (ref 0.61–1.24)
Calcium: 9.1 mg/dL (ref 8.9–10.3)
GFR calc Af Amer: 23 mL/min — ABNORMAL LOW (ref >60)
GFR, EST NON AFRICAN AMERICAN: 20 mL/min — AB (ref >60)
GLUCOSE: 231 mg/dL — AB (ref 65–99)
POTASSIUM: 3.9 mmol/L (ref 3.5–5.1)
Phosphorus: 3.9 mg/dL (ref 2.5–4.6)
Sodium: 130 mmol/L — ABNORMAL LOW (ref 135–145)

## 2017-02-26 LAB — POCT HEMOGLOBIN-HEMACUE: Hemoglobin: 9.7 g/dL — ABNORMAL LOW (ref 13.0–17.0)

## 2017-02-26 LAB — FERRITIN: FERRITIN: 731 ng/mL — AB (ref 24–336)

## 2017-02-26 MED ORDER — DARBEPOETIN ALFA 100 MCG/0.5ML IJ SOSY
PREFILLED_SYRINGE | INTRAMUSCULAR | Status: AC
  Administered 2017-02-26: 100 ug via SUBCUTANEOUS
  Filled 2017-02-26: qty 0.5

## 2017-02-26 MED ORDER — DARBEPOETIN ALFA 100 MCG/0.5ML IJ SOSY
100.0000 ug | PREFILLED_SYRINGE | INTRAMUSCULAR | Status: DC
  Administered 2017-02-26: 100 ug via SUBCUTANEOUS

## 2017-02-27 LAB — PTH, INTACT AND CALCIUM
Calcium, Total (PTH): 9.4 mg/dL (ref 8.6–10.2)
PTH: 73 pg/mL — AB (ref 15–65)

## 2017-03-10 DIAGNOSIS — I5032 Chronic diastolic (congestive) heart failure: Secondary | ICD-10-CM | POA: Diagnosis not present

## 2017-03-10 DIAGNOSIS — Z7901 Long term (current) use of anticoagulants: Secondary | ICD-10-CM | POA: Diagnosis not present

## 2017-03-10 DIAGNOSIS — Z951 Presence of aortocoronary bypass graft: Secondary | ICD-10-CM | POA: Diagnosis not present

## 2017-03-10 DIAGNOSIS — E1122 Type 2 diabetes mellitus with diabetic chronic kidney disease: Secondary | ICD-10-CM | POA: Diagnosis not present

## 2017-03-10 DIAGNOSIS — R0602 Shortness of breath: Secondary | ICD-10-CM | POA: Diagnosis not present

## 2017-03-10 DIAGNOSIS — N184 Chronic kidney disease, stage 4 (severe): Secondary | ICD-10-CM | POA: Diagnosis not present

## 2017-03-10 DIAGNOSIS — Z950 Presence of cardiac pacemaker: Secondary | ICD-10-CM | POA: Diagnosis not present

## 2017-03-10 DIAGNOSIS — I119 Hypertensive heart disease without heart failure: Secondary | ICD-10-CM | POA: Diagnosis not present

## 2017-03-10 DIAGNOSIS — I482 Chronic atrial fibrillation: Secondary | ICD-10-CM | POA: Diagnosis not present

## 2017-03-10 DIAGNOSIS — I2510 Atherosclerotic heart disease of native coronary artery without angina pectoris: Secondary | ICD-10-CM | POA: Diagnosis not present

## 2017-03-10 DIAGNOSIS — E7849 Other hyperlipidemia: Secondary | ICD-10-CM | POA: Diagnosis not present

## 2017-03-19 ENCOUNTER — Inpatient Hospital Stay (HOSPITAL_COMMUNITY)
Admission: RE | Admit: 2017-03-19 | Discharge: 2017-03-19 | Disposition: A | Discharge status: Home / Self Care | Payer: Medicare Other | Source: Ambulatory Visit | Attending: Nephrology | Admitting: Nephrology

## 2017-03-20 DIAGNOSIS — E7849 Other hyperlipidemia: Secondary | ICD-10-CM | POA: Diagnosis not present

## 2017-03-20 DIAGNOSIS — I482 Chronic atrial fibrillation: Secondary | ICD-10-CM | POA: Diagnosis not present

## 2017-03-20 DIAGNOSIS — E1122 Type 2 diabetes mellitus with diabetic chronic kidney disease: Secondary | ICD-10-CM | POA: Diagnosis not present

## 2017-03-20 DIAGNOSIS — Z950 Presence of cardiac pacemaker: Secondary | ICD-10-CM | POA: Diagnosis not present

## 2017-03-20 DIAGNOSIS — I2510 Atherosclerotic heart disease of native coronary artery without angina pectoris: Secondary | ICD-10-CM | POA: Diagnosis not present

## 2017-03-20 DIAGNOSIS — I5032 Chronic diastolic (congestive) heart failure: Secondary | ICD-10-CM | POA: Diagnosis not present

## 2017-03-20 DIAGNOSIS — I119 Hypertensive heart disease without heart failure: Secondary | ICD-10-CM | POA: Diagnosis not present

## 2017-03-20 DIAGNOSIS — R0602 Shortness of breath: Secondary | ICD-10-CM | POA: Diagnosis not present

## 2017-03-20 DIAGNOSIS — N184 Chronic kidney disease, stage 4 (severe): Secondary | ICD-10-CM | POA: Diagnosis not present

## 2017-03-20 DIAGNOSIS — Z951 Presence of aortocoronary bypass graft: Secondary | ICD-10-CM | POA: Diagnosis not present

## 2017-03-20 DIAGNOSIS — Z7901 Long term (current) use of anticoagulants: Secondary | ICD-10-CM | POA: Diagnosis not present

## 2017-03-27 DIAGNOSIS — N184 Chronic kidney disease, stage 4 (severe): Secondary | ICD-10-CM | POA: Diagnosis not present

## 2017-03-27 DIAGNOSIS — M792 Neuralgia and neuritis, unspecified: Secondary | ICD-10-CM | POA: Diagnosis not present

## 2017-03-27 DIAGNOSIS — E1122 Type 2 diabetes mellitus with diabetic chronic kidney disease: Secondary | ICD-10-CM | POA: Diagnosis not present

## 2017-03-27 DIAGNOSIS — D631 Anemia in chronic kidney disease: Secondary | ICD-10-CM | POA: Diagnosis not present

## 2017-03-27 DIAGNOSIS — Z7189 Other specified counseling: Secondary | ICD-10-CM | POA: Diagnosis not present

## 2017-03-27 DIAGNOSIS — Z6831 Body mass index (BMI) 31.0-31.9, adult: Secondary | ICD-10-CM | POA: Diagnosis not present

## 2017-03-27 DIAGNOSIS — R350 Frequency of micturition: Secondary | ICD-10-CM | POA: Diagnosis not present

## 2017-03-27 DIAGNOSIS — I129 Hypertensive chronic kidney disease with stage 1 through stage 4 chronic kidney disease, or unspecified chronic kidney disease: Secondary | ICD-10-CM | POA: Diagnosis not present

## 2017-03-28 ENCOUNTER — Encounter (HOSPITAL_COMMUNITY)
Admission: RE | Admit: 2017-03-28 | Discharge: 2017-03-28 | Disposition: A | Discharge status: Home / Self Care | Payer: Medicare Other | Source: Ambulatory Visit | Attending: Nephrology | Admitting: Nephrology

## 2017-03-28 VITALS — BP 101/60 | HR 78 | Temp 97.9°F | Resp 20

## 2017-03-28 DIAGNOSIS — I2510 Atherosclerotic heart disease of native coronary artery without angina pectoris: Secondary | ICD-10-CM | POA: Diagnosis not present

## 2017-03-28 DIAGNOSIS — Z951 Presence of aortocoronary bypass graft: Secondary | ICD-10-CM | POA: Diagnosis not present

## 2017-03-28 DIAGNOSIS — I119 Hypertensive heart disease without heart failure: Secondary | ICD-10-CM | POA: Diagnosis not present

## 2017-03-28 DIAGNOSIS — Z950 Presence of cardiac pacemaker: Secondary | ICD-10-CM | POA: Diagnosis not present

## 2017-03-28 DIAGNOSIS — N184 Chronic kidney disease, stage 4 (severe): Secondary | ICD-10-CM | POA: Insufficient documentation

## 2017-03-28 DIAGNOSIS — D638 Anemia in other chronic diseases classified elsewhere: Secondary | ICD-10-CM | POA: Insufficient documentation

## 2017-03-28 DIAGNOSIS — I482 Chronic atrial fibrillation: Secondary | ICD-10-CM | POA: Diagnosis not present

## 2017-03-28 DIAGNOSIS — E7849 Other hyperlipidemia: Secondary | ICD-10-CM | POA: Diagnosis not present

## 2017-03-28 DIAGNOSIS — Z7901 Long term (current) use of anticoagulants: Secondary | ICD-10-CM | POA: Diagnosis not present

## 2017-03-28 DIAGNOSIS — E1122 Type 2 diabetes mellitus with diabetic chronic kidney disease: Secondary | ICD-10-CM | POA: Diagnosis not present

## 2017-03-28 DIAGNOSIS — R0602 Shortness of breath: Secondary | ICD-10-CM | POA: Diagnosis not present

## 2017-03-28 DIAGNOSIS — I5032 Chronic diastolic (congestive) heart failure: Secondary | ICD-10-CM | POA: Diagnosis not present

## 2017-03-28 DIAGNOSIS — N185 Chronic kidney disease, stage 5: Secondary | ICD-10-CM

## 2017-03-28 LAB — POCT HEMOGLOBIN-HEMACUE: Hemoglobin: 9.2 g/dL — ABNORMAL LOW (ref 13.0–17.0)

## 2017-03-28 MED ORDER — DARBEPOETIN ALFA 100 MCG/0.5ML IJ SOSY
PREFILLED_SYRINGE | INTRAMUSCULAR | Status: AC
  Filled 2017-03-28: qty 0.5

## 2017-03-28 MED ORDER — DARBEPOETIN ALFA 100 MCG/0.5ML IJ SOSY
100.0000 ug | PREFILLED_SYRINGE | INTRAMUSCULAR | Status: DC
  Administered 2017-03-28: 13:00:00 100 ug via SUBCUTANEOUS

## 2017-04-07 DIAGNOSIS — N184 Chronic kidney disease, stage 4 (severe): Secondary | ICD-10-CM | POA: Diagnosis not present

## 2017-04-07 DIAGNOSIS — M2550 Pain in unspecified joint: Secondary | ICD-10-CM | POA: Diagnosis not present

## 2017-04-07 DIAGNOSIS — C61 Malignant neoplasm of prostate: Secondary | ICD-10-CM | POA: Diagnosis not present

## 2017-04-07 DIAGNOSIS — D509 Iron deficiency anemia, unspecified: Secondary | ICD-10-CM | POA: Diagnosis not present

## 2017-04-07 DIAGNOSIS — I480 Paroxysmal atrial fibrillation: Secondary | ICD-10-CM | POA: Diagnosis not present

## 2017-04-07 DIAGNOSIS — I25810 Atherosclerosis of coronary artery bypass graft(s) without angina pectoris: Secondary | ICD-10-CM | POA: Diagnosis not present

## 2017-04-07 DIAGNOSIS — Z9189 Other specified personal risk factors, not elsewhere classified: Secondary | ICD-10-CM | POA: Diagnosis not present

## 2017-04-07 DIAGNOSIS — D508 Other iron deficiency anemias: Secondary | ICD-10-CM | POA: Diagnosis not present

## 2017-04-07 DIAGNOSIS — M545 Low back pain: Secondary | ICD-10-CM | POA: Diagnosis not present

## 2017-04-07 DIAGNOSIS — Z1389 Encounter for screening for other disorder: Secondary | ICD-10-CM | POA: Diagnosis not present

## 2017-04-07 DIAGNOSIS — E1151 Type 2 diabetes mellitus with diabetic peripheral angiopathy without gangrene: Secondary | ICD-10-CM | POA: Diagnosis not present

## 2017-04-12 IMAGING — CT CT CERVICAL SPINE W/O CM
3 of 4 series · 13 of 33 positions shown, 16 images · non-contrast
Comparison: None.

CLINICAL DATA: 88-year-old male with fall

EXAM:
CT CERVICAL SPINE WITHOUT CONTRAST
TECHNIQUE: Multidetector CT imaging of the cervical spine was performed without
intravenous contrast. Multiplanar CT image reconstructions were also
generated.

[Series 4: c_spine 2.0 st · axial · 0.35mm/px · z∈[+990,+1140]mm · 5 of 107 slices shown, 7 images]
[im 16/107  soft-tissue]
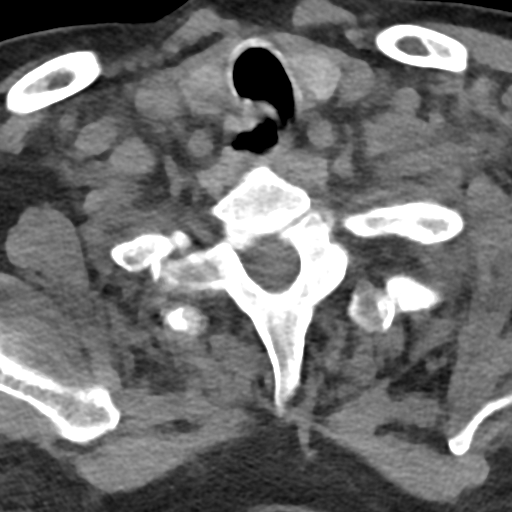
[im 16/107  bone]
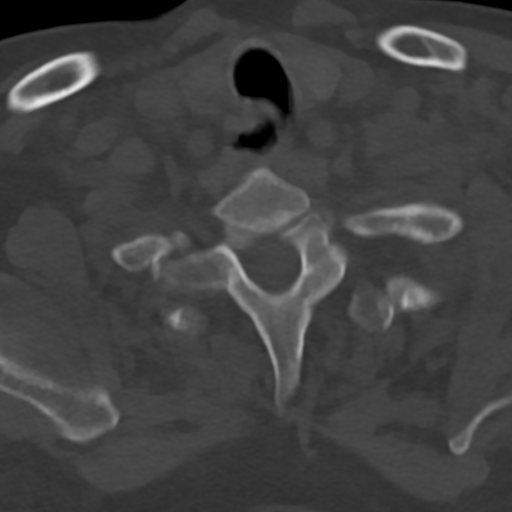
[im 31/107  bone]
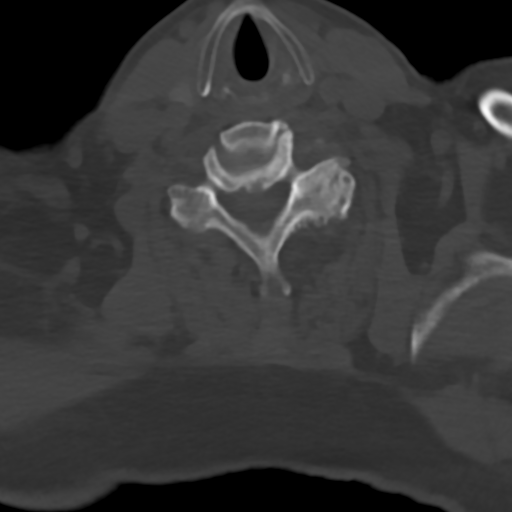
[im 61/107  bone]
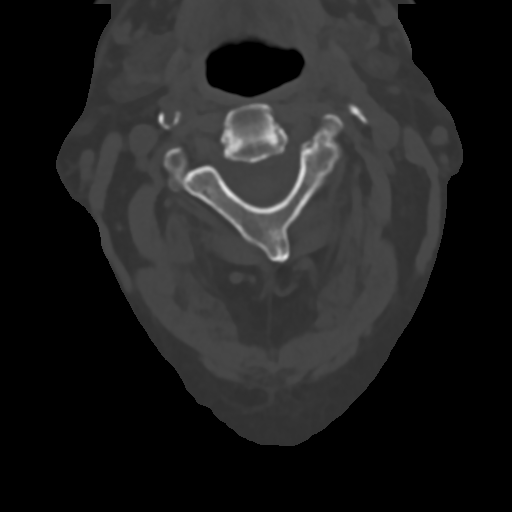
[im 76/107  bone]
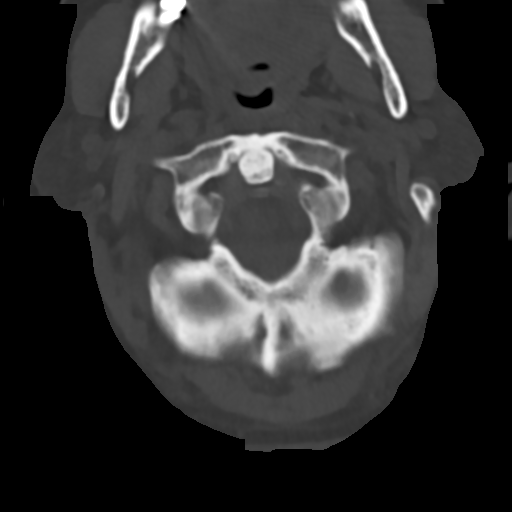
[im 91/107  soft-tissue]
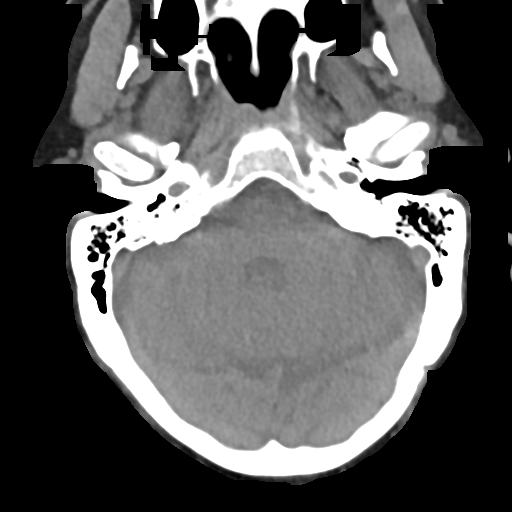
[im 91/107  bone]
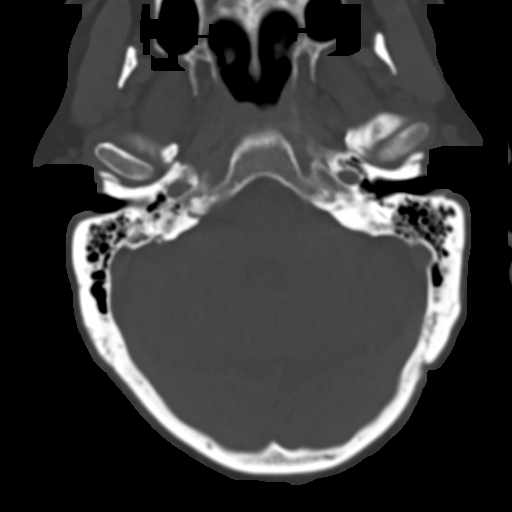

[Series 6: c_spine 2.0 sag bone · sagittal · 0.29mm/px · 5 of 61 slices shown, 6 images]
[im 21/61  bone]
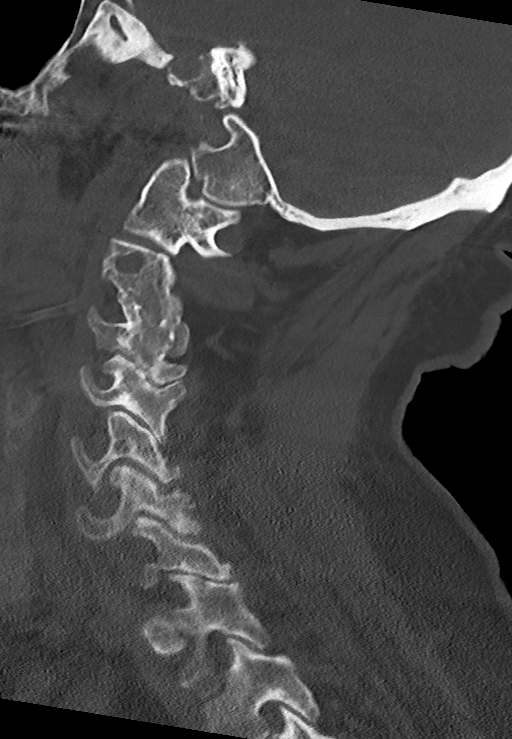
[im 26/61  bone]
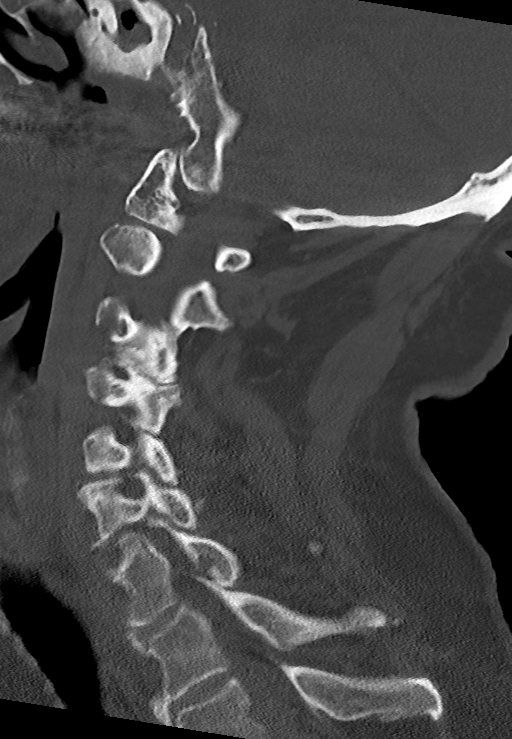
[im 31/61  soft-tissue]
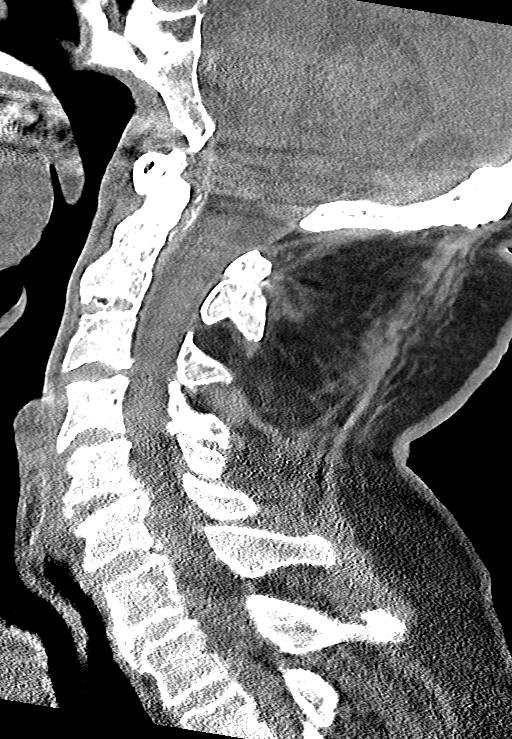
[im 31/61  bone]
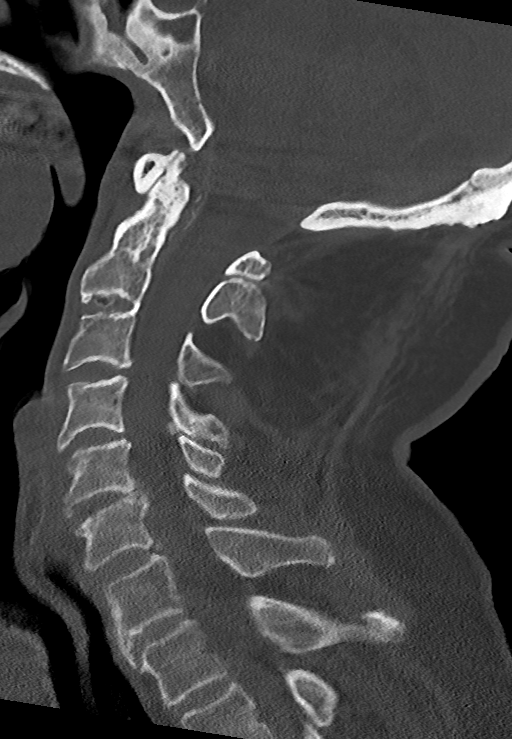
[im 36/61  bone]
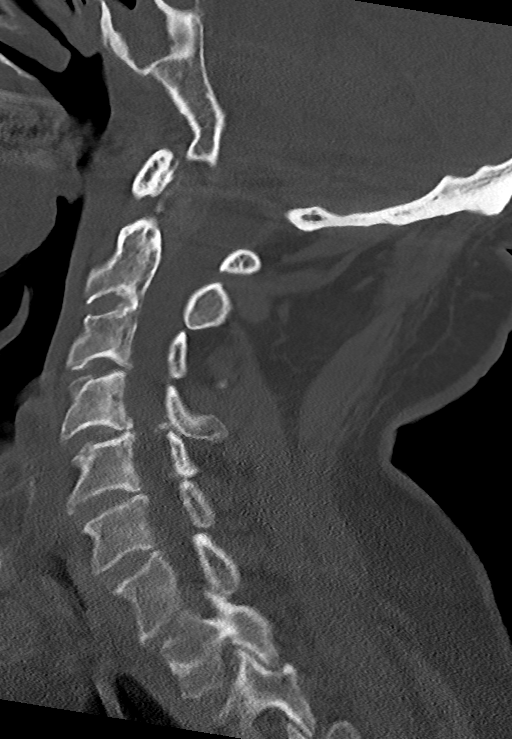
[im 41/61  bone]
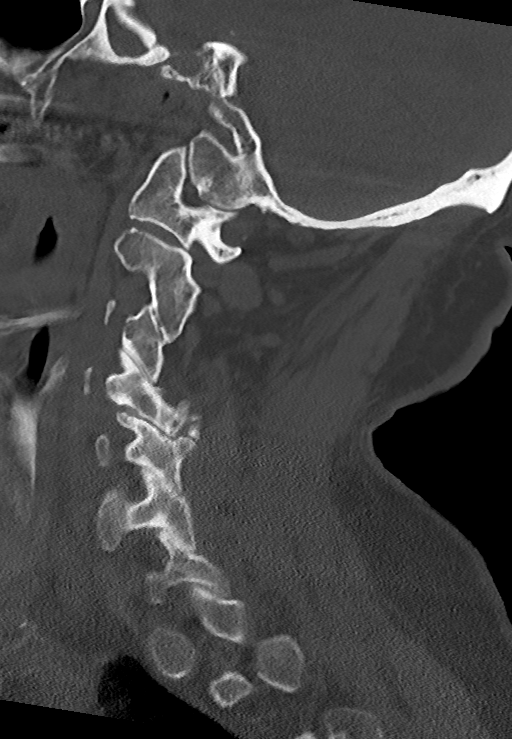

[Series 7: c_spine 2.0 cor bone · coronal · 0.31mm/px · 3 of 61 slices shown]
[im 13/61  bone]
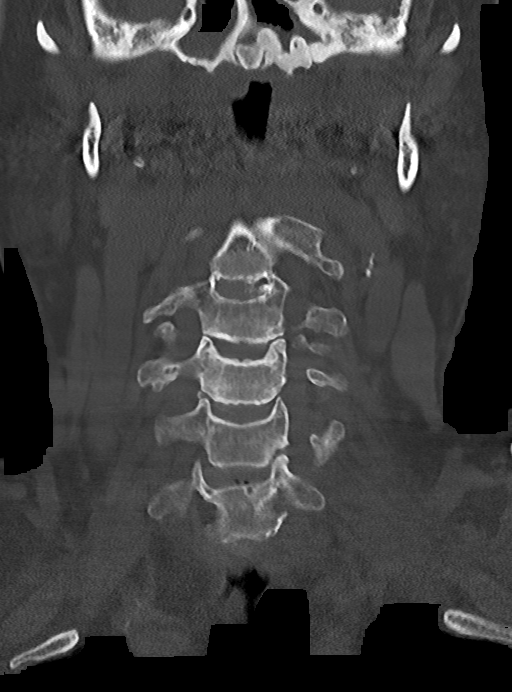
[im 25/61  bone]
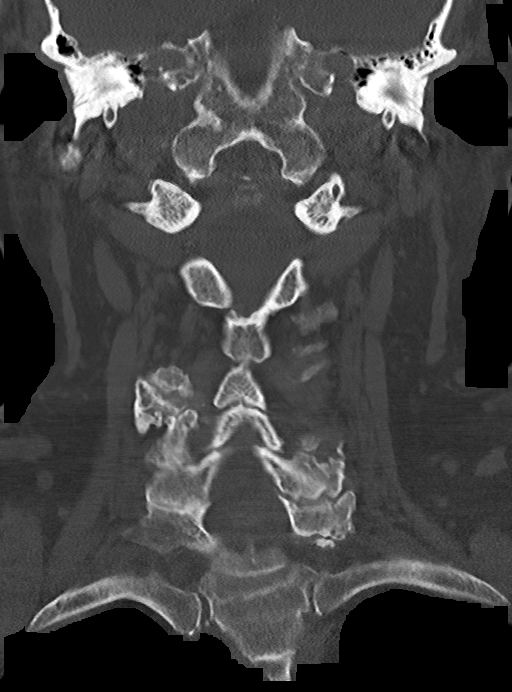
[im 37/61  bone]
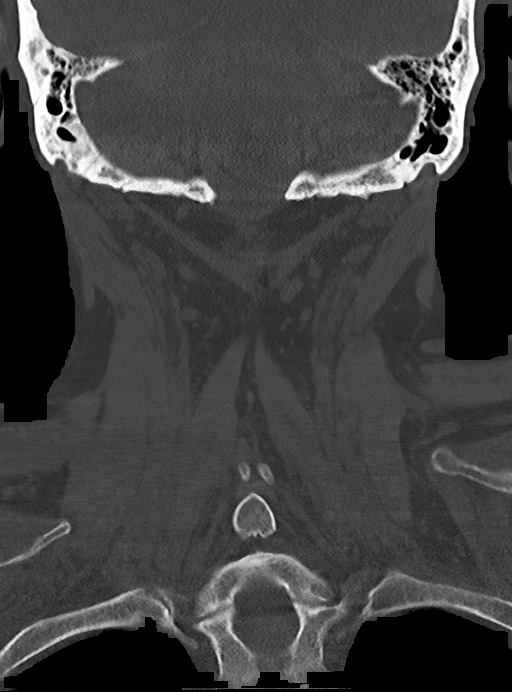

[13 of 33 positions shown; findings below may reference images not displayed]

FINDINGS: There is no acute fracture or subluxation of the cervical
spine.There are multilevel degenerative changes with facet
hypertrophy. There multilevel bilateral neural foramina stenosis
most prominent at left C3-C4 and right C4-C5.The odontoid and
spinous processes are intact.There is normal anatomic alignment of
the C1-C2 lateral masses. The visualized soft tissues appear
unremarkable.
IMPRESSION: No acute/traumatic cervical pathology.

Multilevel degenerative changes.

## 2017-04-18 ENCOUNTER — Ambulatory Visit (HOSPITAL_COMMUNITY)
Admission: RE | Admit: 2017-04-18 | Discharge: 2017-04-18 | Disposition: A | Discharge status: Home / Self Care | Payer: Medicare Other | Source: Ambulatory Visit | Attending: Nephrology | Admitting: Nephrology

## 2017-04-18 VITALS — BP 114/63 | HR 95 | Temp 98.2°F | Resp 20

## 2017-04-18 DIAGNOSIS — N185 Chronic kidney disease, stage 5: Secondary | ICD-10-CM

## 2017-04-18 DIAGNOSIS — Z5181 Encounter for therapeutic drug level monitoring: Secondary | ICD-10-CM | POA: Insufficient documentation

## 2017-04-18 DIAGNOSIS — D631 Anemia in chronic kidney disease: Secondary | ICD-10-CM | POA: Insufficient documentation

## 2017-04-18 DIAGNOSIS — Z79899 Other long term (current) drug therapy: Secondary | ICD-10-CM | POA: Diagnosis not present

## 2017-04-18 DIAGNOSIS — N184 Chronic kidney disease, stage 4 (severe): Secondary | ICD-10-CM | POA: Insufficient documentation

## 2017-04-18 LAB — IRON AND TIBC
IRON: 133 ug/dL (ref 45–182)
Saturation Ratios: 44 % — ABNORMAL HIGH (ref 17.9–39.5)
TIBC: 305 ug/dL (ref 250–450)
UIBC: 172 ug/dL

## 2017-04-18 LAB — RENAL FUNCTION PANEL
ALBUMIN: 3.1 g/dL — AB (ref 3.5–5.0)
ANION GAP: 14 (ref 5–15)
BUN: 75 mg/dL — ABNORMAL HIGH (ref 6–20)
CHLORIDE: 100 mmol/L — AB (ref 101–111)
CO2: 25 mmol/L (ref 22–32)
Calcium: 9 mg/dL (ref 8.9–10.3)
Creatinine, Ser: 2.61 mg/dL — ABNORMAL HIGH (ref 0.61–1.24)
GFR calc Af Amer: 24 mL/min — ABNORMAL LOW (ref >60)
GFR calc non Af Amer: 20 mL/min — ABNORMAL LOW (ref >60)
GLUCOSE: 255 mg/dL — AB (ref 65–99)
PHOSPHORUS: 3.9 mg/dL (ref 2.5–4.6)
POTASSIUM: 4.5 mmol/L (ref 3.5–5.1)
Sodium: 139 mmol/L (ref 135–145)

## 2017-04-18 LAB — POCT HEMOGLOBIN-HEMACUE: Hemoglobin: 10 g/dL — ABNORMAL LOW (ref 13.0–17.0)

## 2017-04-18 LAB — FERRITIN: FERRITIN: 568 ng/mL — AB (ref 24–336)

## 2017-04-18 MED ORDER — DARBEPOETIN ALFA 100 MCG/0.5ML IJ SOSY
PREFILLED_SYRINGE | INTRAMUSCULAR | Status: AC
  Filled 2017-04-18: qty 0.5

## 2017-04-18 MED ORDER — DARBEPOETIN ALFA 100 MCG/0.5ML IJ SOSY
100.0000 ug | PREFILLED_SYRINGE | INTRAMUSCULAR | Status: DC
  Administered 2017-04-18: 13:00:00 100 ug via SUBCUTANEOUS

## 2017-04-19 LAB — PTH, INTACT AND CALCIUM
CALCIUM TOTAL (PTH): 8.9 mg/dL (ref 8.6–10.2)
PTH: 74 pg/mL — ABNORMAL HIGH (ref 15–65)

## 2017-04-25 DIAGNOSIS — I2510 Atherosclerotic heart disease of native coronary artery without angina pectoris: Secondary | ICD-10-CM | POA: Diagnosis not present

## 2017-04-25 DIAGNOSIS — I119 Hypertensive heart disease without heart failure: Secondary | ICD-10-CM | POA: Diagnosis not present

## 2017-04-25 DIAGNOSIS — I5032 Chronic diastolic (congestive) heart failure: Secondary | ICD-10-CM | POA: Diagnosis not present

## 2017-04-25 DIAGNOSIS — N184 Chronic kidney disease, stage 4 (severe): Secondary | ICD-10-CM | POA: Diagnosis not present

## 2017-04-25 DIAGNOSIS — R0602 Shortness of breath: Secondary | ICD-10-CM | POA: Diagnosis not present

## 2017-04-25 DIAGNOSIS — I482 Chronic atrial fibrillation: Secondary | ICD-10-CM | POA: Diagnosis not present

## 2017-04-25 DIAGNOSIS — Z950 Presence of cardiac pacemaker: Secondary | ICD-10-CM | POA: Diagnosis not present

## 2017-04-25 DIAGNOSIS — E1122 Type 2 diabetes mellitus with diabetic chronic kidney disease: Secondary | ICD-10-CM | POA: Diagnosis not present

## 2017-04-25 DIAGNOSIS — Z951 Presence of aortocoronary bypass graft: Secondary | ICD-10-CM | POA: Diagnosis not present

## 2017-04-25 DIAGNOSIS — Z7901 Long term (current) use of anticoagulants: Secondary | ICD-10-CM | POA: Diagnosis not present

## 2017-04-25 DIAGNOSIS — E7849 Other hyperlipidemia: Secondary | ICD-10-CM | POA: Diagnosis not present

## 2017-05-08 DIAGNOSIS — I2510 Atherosclerotic heart disease of native coronary artery without angina pectoris: Secondary | ICD-10-CM | POA: Diagnosis not present

## 2017-05-08 DIAGNOSIS — Z950 Presence of cardiac pacemaker: Secondary | ICD-10-CM | POA: Diagnosis not present

## 2017-05-08 DIAGNOSIS — N184 Chronic kidney disease, stage 4 (severe): Secondary | ICD-10-CM | POA: Diagnosis not present

## 2017-05-08 DIAGNOSIS — Z7901 Long term (current) use of anticoagulants: Secondary | ICD-10-CM | POA: Diagnosis not present

## 2017-05-08 DIAGNOSIS — I5032 Chronic diastolic (congestive) heart failure: Secondary | ICD-10-CM | POA: Diagnosis not present

## 2017-05-08 DIAGNOSIS — I119 Hypertensive heart disease without heart failure: Secondary | ICD-10-CM | POA: Diagnosis not present

## 2017-05-08 DIAGNOSIS — R0602 Shortness of breath: Secondary | ICD-10-CM | POA: Diagnosis not present

## 2017-05-08 DIAGNOSIS — Z951 Presence of aortocoronary bypass graft: Secondary | ICD-10-CM | POA: Diagnosis not present

## 2017-05-08 DIAGNOSIS — I482 Chronic atrial fibrillation: Secondary | ICD-10-CM | POA: Diagnosis not present

## 2017-05-08 DIAGNOSIS — E7849 Other hyperlipidemia: Secondary | ICD-10-CM | POA: Diagnosis not present

## 2017-05-08 DIAGNOSIS — E1122 Type 2 diabetes mellitus with diabetic chronic kidney disease: Secondary | ICD-10-CM | POA: Diagnosis not present

## 2017-05-15 ENCOUNTER — Emergency Department (HOSPITAL_COMMUNITY): Payer: Medicare Other

## 2017-05-15 ENCOUNTER — Inpatient Hospital Stay (HOSPITAL_COMMUNITY)
Admission: EM | Admit: 2017-05-15 | Discharge: 2017-05-21 | DRG: 683 | Disposition: A | Discharge status: Skilled Nursing Facility | Payer: Medicare Other | Attending: Family Medicine | Admitting: Family Medicine

## 2017-05-15 ENCOUNTER — Other Ambulatory Visit: Payer: Self-pay

## 2017-05-15 ENCOUNTER — Encounter (HOSPITAL_COMMUNITY): Payer: Self-pay | Admitting: Emergency Medicine

## 2017-05-15 DIAGNOSIS — Z950 Presence of cardiac pacemaker: Secondary | ICD-10-CM

## 2017-05-15 DIAGNOSIS — Z880 Allergy status to penicillin: Secondary | ICD-10-CM

## 2017-05-15 DIAGNOSIS — Z6832 Body mass index (BMI) 32.0-32.9, adult: Secondary | ICD-10-CM

## 2017-05-15 DIAGNOSIS — Z66 Do not resuscitate: Secondary | ICD-10-CM | POA: Diagnosis present

## 2017-05-15 DIAGNOSIS — N19 Unspecified kidney failure: Secondary | ICD-10-CM | POA: Diagnosis not present

## 2017-05-15 DIAGNOSIS — N289 Disorder of kidney and ureter, unspecified: Secondary | ICD-10-CM | POA: Diagnosis not present

## 2017-05-15 DIAGNOSIS — J449 Chronic obstructive pulmonary disease, unspecified: Secondary | ICD-10-CM | POA: Diagnosis present

## 2017-05-15 DIAGNOSIS — I132 Hypertensive heart and chronic kidney disease with heart failure and with stage 5 chronic kidney disease, or end stage renal disease: Secondary | ICD-10-CM | POA: Diagnosis present

## 2017-05-15 DIAGNOSIS — Z9981 Dependence on supplemental oxygen: Secondary | ICD-10-CM | POA: Diagnosis not present

## 2017-05-15 DIAGNOSIS — I10 Essential (primary) hypertension: Secondary | ICD-10-CM | POA: Diagnosis not present

## 2017-05-15 DIAGNOSIS — G8929 Other chronic pain: Secondary | ICD-10-CM | POA: Diagnosis present

## 2017-05-15 DIAGNOSIS — E785 Hyperlipidemia, unspecified: Secondary | ICD-10-CM | POA: Diagnosis present

## 2017-05-15 DIAGNOSIS — I5032 Chronic diastolic (congestive) heart failure: Secondary | ICD-10-CM | POA: Diagnosis present

## 2017-05-15 DIAGNOSIS — Z515 Encounter for palliative care: Secondary | ICD-10-CM | POA: Diagnosis not present

## 2017-05-15 DIAGNOSIS — I2510 Atherosclerotic heart disease of native coronary artery without angina pectoris: Secondary | ICD-10-CM | POA: Diagnosis present

## 2017-05-15 DIAGNOSIS — Z833 Family history of diabetes mellitus: Secondary | ICD-10-CM

## 2017-05-15 DIAGNOSIS — R54 Age-related physical debility: Secondary | ICD-10-CM | POA: Diagnosis present

## 2017-05-15 DIAGNOSIS — R5383 Other fatigue: Secondary | ICD-10-CM | POA: Diagnosis not present

## 2017-05-15 DIAGNOSIS — E1122 Type 2 diabetes mellitus with diabetic chronic kidney disease: Secondary | ICD-10-CM | POA: Diagnosis present

## 2017-05-15 DIAGNOSIS — E669 Obesity, unspecified: Secondary | ICD-10-CM | POA: Diagnosis present

## 2017-05-15 DIAGNOSIS — N179 Acute kidney failure, unspecified: Secondary | ICD-10-CM | POA: Diagnosis not present

## 2017-05-15 DIAGNOSIS — R9089 Other abnormal findings on diagnostic imaging of central nervous system: Secondary | ICD-10-CM | POA: Diagnosis not present

## 2017-05-15 DIAGNOSIS — N185 Chronic kidney disease, stage 5: Secondary | ICD-10-CM | POA: Diagnosis not present

## 2017-05-15 DIAGNOSIS — R079 Chest pain, unspecified: Secondary | ICD-10-CM | POA: Diagnosis not present

## 2017-05-15 DIAGNOSIS — D631 Anemia in chronic kidney disease: Secondary | ICD-10-CM | POA: Diagnosis present

## 2017-05-15 DIAGNOSIS — T380X5A Adverse effect of glucocorticoids and synthetic analogues, initial encounter: Secondary | ICD-10-CM | POA: Diagnosis present

## 2017-05-15 DIAGNOSIS — R278 Other lack of coordination: Secondary | ICD-10-CM | POA: Diagnosis not present

## 2017-05-15 DIAGNOSIS — Z885 Allergy status to narcotic agent status: Secondary | ICD-10-CM

## 2017-05-15 DIAGNOSIS — E860 Dehydration: Secondary | ICD-10-CM | POA: Diagnosis present

## 2017-05-15 DIAGNOSIS — Z87891 Personal history of nicotine dependence: Secondary | ICD-10-CM

## 2017-05-15 DIAGNOSIS — E1165 Type 2 diabetes mellitus with hyperglycemia: Secondary | ICD-10-CM | POA: Diagnosis present

## 2017-05-15 DIAGNOSIS — E1142 Type 2 diabetes mellitus with diabetic polyneuropathy: Secondary | ICD-10-CM | POA: Diagnosis present

## 2017-05-15 DIAGNOSIS — N186 End stage renal disease: Secondary | ICD-10-CM | POA: Diagnosis present

## 2017-05-15 DIAGNOSIS — R404 Transient alteration of awareness: Secondary | ICD-10-CM | POA: Diagnosis not present

## 2017-05-15 DIAGNOSIS — Z7901 Long term (current) use of anticoagulants: Secondary | ICD-10-CM | POA: Diagnosis not present

## 2017-05-15 DIAGNOSIS — Z8546 Personal history of malignant neoplasm of prostate: Secondary | ICD-10-CM

## 2017-05-15 DIAGNOSIS — I480 Paroxysmal atrial fibrillation: Secondary | ICD-10-CM | POA: Diagnosis present

## 2017-05-15 DIAGNOSIS — N400 Enlarged prostate without lower urinary tract symptoms: Secondary | ICD-10-CM | POA: Diagnosis present

## 2017-05-15 DIAGNOSIS — I509 Heart failure, unspecified: Secondary | ICD-10-CM | POA: Diagnosis not present

## 2017-05-15 DIAGNOSIS — Z85828 Personal history of other malignant neoplasm of skin: Secondary | ICD-10-CM

## 2017-05-15 DIAGNOSIS — M1990 Unspecified osteoarthritis, unspecified site: Secondary | ICD-10-CM | POA: Diagnosis present

## 2017-05-15 DIAGNOSIS — Z951 Presence of aortocoronary bypass graft: Secondary | ICD-10-CM

## 2017-05-15 DIAGNOSIS — R531 Weakness: Secondary | ICD-10-CM | POA: Diagnosis not present

## 2017-05-15 DIAGNOSIS — R7989 Other specified abnormal findings of blood chemistry: Secondary | ICD-10-CM | POA: Diagnosis not present

## 2017-05-15 DIAGNOSIS — R270 Ataxia, unspecified: Secondary | ICD-10-CM | POA: Diagnosis present

## 2017-05-15 DIAGNOSIS — E1129 Type 2 diabetes mellitus with other diabetic kidney complication: Secondary | ICD-10-CM | POA: Diagnosis not present

## 2017-05-15 DIAGNOSIS — I482 Chronic atrial fibrillation: Secondary | ICD-10-CM | POA: Diagnosis present

## 2017-05-15 DIAGNOSIS — G894 Chronic pain syndrome: Secondary | ICD-10-CM | POA: Diagnosis not present

## 2017-05-15 DIAGNOSIS — C61 Malignant neoplasm of prostate: Secondary | ICD-10-CM | POA: Diagnosis not present

## 2017-05-15 DIAGNOSIS — M6281 Muscle weakness (generalized): Secondary | ICD-10-CM | POA: Diagnosis not present

## 2017-05-15 DIAGNOSIS — I120 Hypertensive chronic kidney disease with stage 5 chronic kidney disease or end stage renal disease: Secondary | ICD-10-CM | POA: Diagnosis not present

## 2017-05-15 DIAGNOSIS — Z888 Allergy status to other drugs, medicaments and biological substances status: Secondary | ICD-10-CM

## 2017-05-15 DIAGNOSIS — R2689 Other abnormalities of gait and mobility: Secondary | ICD-10-CM | POA: Diagnosis not present

## 2017-05-15 DIAGNOSIS — Z7984 Long term (current) use of oral hypoglycemic drugs: Secondary | ICD-10-CM

## 2017-05-15 DIAGNOSIS — Z8673 Personal history of transient ischemic attack (TIA), and cerebral infarction without residual deficits: Secondary | ICD-10-CM

## 2017-05-15 DIAGNOSIS — Z8042 Family history of malignant neoplasm of prostate: Secondary | ICD-10-CM

## 2017-05-15 DIAGNOSIS — Z79899 Other long term (current) drug therapy: Secondary | ICD-10-CM

## 2017-05-15 DIAGNOSIS — D638 Anemia in other chronic diseases classified elsewhere: Secondary | ICD-10-CM | POA: Diagnosis not present

## 2017-05-15 LAB — HEPATIC FUNCTION PANEL
ALBUMIN: 2.9 g/dL — AB (ref 3.5–5.0)
ALT: 30 U/L (ref 17–63)
AST: 43 U/L — AB (ref 15–41)
Alkaline Phosphatase: 58 U/L (ref 38–126)
BILIRUBIN DIRECT: 0.1 mg/dL (ref 0.1–0.5)
Indirect Bilirubin: 1 mg/dL — ABNORMAL HIGH (ref 0.3–0.9)
Total Bilirubin: 1.1 mg/dL (ref 0.3–1.2)
Total Protein: 6.6 g/dL (ref 6.5–8.1)

## 2017-05-15 LAB — BASIC METABOLIC PANEL
Anion gap: 14 (ref 5–15)
BUN: 106 mg/dL — AB (ref 6–20)
CHLORIDE: 95 mmol/L — AB (ref 101–111)
CO2: 27 mmol/L (ref 22–32)
Calcium: 8.9 mg/dL (ref 8.9–10.3)
Creatinine, Ser: 2.93 mg/dL — ABNORMAL HIGH (ref 0.61–1.24)
GFR calc Af Amer: 21 mL/min — ABNORMAL LOW (ref >60)
GFR calc non Af Amer: 18 mL/min — ABNORMAL LOW (ref >60)
Glucose, Bld: 367 mg/dL — ABNORMAL HIGH (ref 65–99)
Potassium: 4.7 mmol/L (ref 3.5–5.1)
Sodium: 136 mmol/L (ref 135–145)

## 2017-05-15 LAB — TROPONIN I
TROPONIN I: 0.04 ng/mL — AB (ref <0.03)
TROPONIN I: 0.03 ng/mL — AB (ref <0.03)

## 2017-05-15 LAB — CBC
HCT: 29.6 % — ABNORMAL LOW (ref 39.0–52.0)
Hemoglobin: 9 g/dL — ABNORMAL LOW (ref 13.0–17.0)
MCH: 31 pg (ref 26.0–34.0)
MCHC: 30.4 g/dL (ref 30.0–36.0)
MCV: 102.1 fL — AB (ref 78.0–100.0)
PLATELETS: 353 10*3/uL (ref 150–400)
RBC: 2.9 MIL/uL — AB (ref 4.22–5.81)
RDW: 18.4 % — AB (ref 11.5–15.5)
WBC: 6.7 10*3/uL (ref 4.0–10.5)

## 2017-05-15 LAB — PROTIME-INR
INR: 1.37
Prothrombin Time: 16.8 seconds — ABNORMAL HIGH (ref 11.4–15.2)

## 2017-05-15 LAB — URINALYSIS, ROUTINE W REFLEX MICROSCOPIC
BACTERIA UA: NONE SEEN
Bilirubin Urine: NEGATIVE
Hgb urine dipstick: NEGATIVE
Ketones, ur: NEGATIVE mg/dL
Leukocytes, UA: NEGATIVE
Nitrite: NEGATIVE
PROTEIN: NEGATIVE mg/dL
SQUAMOUS EPITHELIAL / LPF: NONE SEEN
Specific Gravity, Urine: 1.01 (ref 1.005–1.030)
pH: 6 (ref 5.0–8.0)

## 2017-05-15 LAB — BRAIN NATRIURETIC PEPTIDE: B Natriuretic Peptide: 300.4 pg/mL — ABNORMAL HIGH (ref 0.0–100.0)

## 2017-05-15 LAB — GLUCOSE, CAPILLARY: Glucose-Capillary: 357 mg/dL — ABNORMAL HIGH (ref 65–99)

## 2017-05-15 MED ORDER — ACETAMINOPHEN 650 MG RE SUPP: 650.0000 mg | Freq: Four times a day (QID) | RECTAL | Status: DC | PRN

## 2017-05-15 MED ORDER — SODIUM CHLORIDE 0.9 % IV SOLN
Freq: Once | INTRAVENOUS | Status: AC
  Administered 2017-05-15: 17:00:00 via INTRAVENOUS

## 2017-05-15 MED ORDER — INSULIN ASPART 100 UNIT/ML ~~LOC~~ SOLN
0.0000 [IU] | Freq: Three times a day (TID) | SUBCUTANEOUS | Status: DC
  Administered 2017-05-16: 3 [IU] via SUBCUTANEOUS
  Administered 2017-05-16: 5 [IU] via SUBCUTANEOUS
  Administered 2017-05-16: 3 [IU] via SUBCUTANEOUS
  Administered 2017-05-17: 7 [IU] via SUBCUTANEOUS
  Administered 2017-05-17 (×2): 5 [IU] via SUBCUTANEOUS
  Administered 2017-05-18: 3 [IU] via SUBCUTANEOUS
  Administered 2017-05-18: 5 [IU] via SUBCUTANEOUS
  Administered 2017-05-18: 9 [IU] via SUBCUTANEOUS
  Administered 2017-05-19: 5 [IU] via SUBCUTANEOUS

## 2017-05-15 MED ORDER — METOPROLOL SUCCINATE ER 25 MG PO TB24
25.0000 mg | ORAL_TABLET | Freq: Every day | ORAL | Status: DC
  Administered 2017-05-16 – 2017-05-21 (×6): 25 mg via ORAL
  Filled 2017-05-15 (×7): qty 1

## 2017-05-15 MED ORDER — DARBEPOETIN ALFA 100 MCG/0.5ML IJ SOSY
100.0000 ug | PREFILLED_SYRINGE | INTRAMUSCULAR | Status: DC
  Administered 2017-05-16: 100 ug via SUBCUTANEOUS
  Filled 2017-05-15: qty 0.5

## 2017-05-15 MED ORDER — WARFARIN - PHARMACIST DOSING INPATIENT
Freq: Every day | Status: DC
  Administered 2017-05-19: 17:00:00

## 2017-05-15 MED ORDER — SODIUM CHLORIDE 0.45 % IV SOLN
INTRAVENOUS | Status: DC
  Administered 2017-05-15 – 2017-05-16 (×2): via INTRAVENOUS

## 2017-05-15 MED ORDER — HYDROCODONE-ACETAMINOPHEN 5-325 MG PO TABS
1.0000 | ORAL_TABLET | Freq: Four times a day (QID) | ORAL | Status: DC | PRN
  Administered 2017-05-15 – 2017-05-21 (×14): 1 via ORAL
  Filled 2017-05-15 (×15): qty 1

## 2017-05-15 MED ORDER — ONDANSETRON HCL 4 MG/2ML IJ SOLN: 4.0000 mg | Freq: Four times a day (QID) | INTRAMUSCULAR | Status: DC | PRN

## 2017-05-15 MED ORDER — INSULIN ASPART 100 UNIT/ML ~~LOC~~ SOLN
0.0000 [IU] | Freq: Every day | SUBCUTANEOUS | Status: DC
  Administered 2017-05-15 – 2017-05-17 (×3): 5 [IU] via SUBCUTANEOUS
  Administered 2017-05-17: 4 [IU] via SUBCUTANEOUS

## 2017-05-15 MED ORDER — ONDANSETRON HCL 4 MG PO TABS
4.0000 mg | ORAL_TABLET | Freq: Four times a day (QID) | ORAL | Status: DC | PRN
Start: 2017-05-15 — End: 2017-05-21

## 2017-05-15 MED ORDER — POLYETHYLENE GLYCOL 3350 17 G PO PACK: 17.0000 g | PACK | Freq: Every day | ORAL | Status: DC | PRN

## 2017-05-15 MED ORDER — ACETAMINOPHEN 325 MG PO TABS
650.0000 mg | ORAL_TABLET | Freq: Four times a day (QID) | ORAL | Status: DC | PRN
  Administered 2017-05-16 – 2017-05-21 (×5): 650 mg via ORAL
  Filled 2017-05-15 (×6): qty 2

## 2017-05-15 MED ORDER — WARFARIN SODIUM 6 MG PO TABS
6.0000 mg | ORAL_TABLET | Freq: Once | ORAL | Status: AC
  Administered 2017-05-15: 6 mg via ORAL
  Filled 2017-05-15: qty 1

## 2017-05-15 NOTE — ED Triage Notes (Signed)
Pt BIB EMS from home for generalized weakness x 2 mos. CBG 488 with EMS, pt c/o generalized pain, intermittent nausea and SOB both resolved at this time. Pt states he was too weak to stand today so decided to come get checked out. Pt received 200 cc NS PTA. A&Ox4; NAD. Family at bedside.

## 2017-05-15 NOTE — H&P (Signed)
[facility] Admission History and Physical Service Pager: [phone]  Patient name: Christopher [name] Medical record number: [id number] Date of birth: Mar 31, 1926 Age: 84 y.o. Gender: male  Primary Care Provider: Prince Solian, MD Consultants: Palliative Code Status: DNR  Chief Complaint: Generalized weakness  Assessment and Plan: Christopher [name] is a 84 y.o. male presenting with generalized weakness. PMH is significant for CAD s/p CABG x 3 in 2003, pacemaker in place, pA-fib on Coumadin, HTN, HFpEF, HLD, CKD V, hx prostate cancer  Generalized Weakness: Likely due to combination of dehydration (from poor PO intake x 1 week and elevated blood sugars causing an osmotic diuresis), azotemia (BUN 106), and deconditioning. Cardiac cause unlikely without chest pain or ST/T wave changes on EKG. Troponin mildly elevated to 0.04, but may be slightly elevated in the setting of CKD IV. He does have a history of anemia, but his hemoglobin is at baseline. No focal neurological deficits to suggest stroke. No new medications recently that could have contributed. Infection unlikely without any systemic symptoms. CHF exacerbation unlikely with BNP of 300 (previously [phone]) and with no signs of volume overload on exam. - Admit to med-surg under inpatient status, attending Dr. Erin Hearing - Will trend troponins and repeat AM EKG - Will also check TSH and CK to rule out other causes  - Gentle hydration overnight - Recheck AM CBC and BMP - PT/OT - Palliative care consulted per family's request  Azotemia in CKD IV: Follows with Dr. Moshe Cipro. Does not want to pursue HD. Takes Lasix 160mg  in the morning and 80mg  in the evening, as well as Metolazine 2.5mg  daily as needed. BUN 106, Cr 2.93 (BL ~3). - Nephrology consulted, appreciate recommendations - Continue gentle hydration- NS @ 63ml/hr - Hold Lasix and Metolazone for now and re-assess in the morning - Repeat AM BMP  CAD s/p CABG:  in 2003. No chest pain currently - Continue home Metoprolol succinate 25mg  daily  Paroxysmal A-fib: In A-fib but rate controlled with HRs in the 70s. On Coumadin at home. CHADSVASc is a 5. - Coumadin per pharm - Check PT/INR - Continue home Metoprolol succinate 25mg  daily  HTN: Normotensive in the ED. - Continue home Metoprolol succinate 25mg  daily  T2DM: On Amaryl 0.5mg  at home. Last A1c per our records is 6.1%. Glucose on admission was 376. - Hold Amaryl while hospitalized and would recommend not restarting on discharge due to patient's age - CBGs with meals and at bedtime - Sensitive SSI - Check A1c  HFpEF: Last ECHO in 2015 with EF 55-60%, mild LVH, indeterminate diastolic dysfunction, moderately dilated left atrium - Holding Lasix and Metolazone tonight and will re-assess in the morning  HLD: Not on a statin due to intolerance. Last lipid panel 06/2014 with Chol 218, HDL 39, LDL 142, TG 186 - Will not recheck lipid panel because do not think it will change management  Anemia of Chronic Disease: On Aranesp. Hgb 9.0 in the ED (BL 9-10). - Per nephro - Daily CBCs  Hx Prostate Cancer: stable. Cannot see any records about this.  - Monitor  Chronic Pain: due to osteoarthritis - Continue home Norco 5-325mg  q6hrs prn  FEN/GI: Heart healthy carb-modified Prophylaxis: Coumadin  Disposition: Anticipate discharge home in the next 2-3 days. Think patient could likely benefit from SNF placement.  History of Present Illness:  Christopher [name] is a 84 y.o. male presenting with generalized weakness for the last month, but worse this morning. He couldn't even get out of  bed. He has also been having body aches for the last year. He has been having shortness of breath for the last few months, but worse over the last month. The shortness of breath is worse with walking. No orthopnea. He endorses occasional substernal chest pain. The chest pain comes on randomly with sitting. The chest pain "feels  like an ache". He has had decreased appetite over the last week. He has been drinking plenty of water at home.  At baseline, he uses a walker at home. He requires assistance with bathing, toileting, etc. Lives at home with wife.  In the ED, he was normotensive with HRs in the 70s. O2 saturations were 99-100% on RA (no documented desaturation). Labs were significant for Cr 2.93 (BL ~3), BUN 106, BNP 300, trop 0.04, Hgb 9.0 (BL 9-10), INR 1.37, UA neg. CXR did not show any acute abnormalities. He was admitted for further management.  Review Of Systems: Per HPI with the following additions: see below  Review of Systems  Constitutional: Negative for chills and fever.  HENT: Negative for congestion, hearing loss and sore throat.   Eyes: Positive for double vision. Negative for blurred vision.  Respiratory: Positive for shortness of breath. Negative for cough.   Cardiovascular: Positive for chest pain and leg swelling.  Gastrointestinal: Positive for nausea. Negative for constipation, diarrhea and vomiting.  Genitourinary: Negative for dysuria, hematuria and urgency.  Musculoskeletal: Positive for falls and myalgias.  Neurological: Negative for dizziness and headaches.  Psychiatric/Behavioral: Negative for depression. The patient is not nervous/anxious.     Patient Active Problem List   Diagnosis Date Noted  . CHF (congestive heart failure) ([place]) 06/11/2016  . Chronic diastolic congestive heart failure, NYHA class 2 ([place]) 06/10/2016  . Acute pulmonary edema ([place]) 05/17/2016  . Obesity (BMI 30-39.9) 05/17/2016  . Anemia of chronic disease 10/05/2015  . Hypoxia 07/20/2015  . Acute on chronic diastolic CHF (congestive heart failure), NYHA class 2 ([place]) 07/24/2013  . DM type 2 causing renal disease ([place]) 07/24/2013  . Pulmonary nodule with restrictive lung disease 07/24/2013  . Malignant neoplasm of prostate ([place]) 07/16/2013  . History of amiodarone therapy 07/16/2012  . Paroxysmal atrial  fibrillation ([place]) 08/07/2011  . Lumbar disc disease   . Hyperlipidemia   . Hypertensive heart disease without CHF   . CKD (chronic kidney disease), stage V ([place])   . Long-term (current) use of anticoagulants   . BPH (benign prostatic hyperplasia)   . Cardiac pacemaker in situ   . CAD (coronary artery disease) 03/06/2010    Past Medical History: Past Medical History:  Diagnosis Date  . Anemia   . Atrial fibrillation ([place]) 08/07/2011   CHADS score  2 CHADS2 VASC score 4  Onset winter of 2013 Cardioversion May 2013 Reversion to a fib 3/14 amiodarone begun 07/02/12  Repeat cardioversion Jul 16, 2012    . BPH (benign prostatic hyperplasia)   . CAD (coronary artery disease) 03/06/2010   CABG with LIMA to LAD, SVG to OM-OM2  05/05/01 Dr. Roxan Hockey  Cardiac Cath 12/2009 60% left main, occluded LAD, Widely patent SVG to OM1 and 2, patent LIMA, widely patent RCA    . Cardiac pacemaker in situ    07/26/10  Inserted for chronotropic incompetence and dyspnea  RA lead  Medtronic 5076  serial number [id number]   RV lead  Medtronic 5076  serial number [id number] Medtronic Adapta RL pulse generator, SN  [id number]    . Chronic diastolic congestive heart failure, NYHA class 2 (  [place]) 06/10/2016  . Chronic kidney disease (CKD), stage IV (severe) (HCC)    Dr. Mercy Moore  . Diabetes mellitus without complication ([place])    Type II  . History of skin cancer   . Hypertensive heart disease without CHF   . Lumbar disc disease 1992  . Prostate cancer ([place])   . Sinus node dysfunction (HCC)    symptomatic bradycardia    Past Surgical History: Past Surgical History:  Procedure Laterality Date  . APPENDECTOMY  1980  . AV FISTULA PLACEMENT Left 11/08/2015   Procedure: ARTERIOVENOUS (AV) FISTULA CREATION;  Surgeon: [name], MD;  Location: [place];  Service: Vascular;  Laterality: Left;  . AV FISTULA PLACEMENT Left 01/03/2016   Procedure: Creation of BRACHIOCEPHALIC ARTERIOVENOUS (AV) FISTULA Left arm;  Surgeon:  [name], MD;  [facility];  Service: Vascular;  Laterality: Left;  . AV FISTULA PLACEMENT Right 05/14/2016   Procedure: RADIOCEPHALIC ARTERIOVENOUS (AV) FISTULA CREATION RIGHT LOWER ARM;  Surgeon: Elam Dutch, MD;  Location: [place];  Service: Vascular;  Laterality: Right;  . CARDIAC CATHETERIZATION  2003  . CARDIOVERSION  08/08/2011   Procedure: CARDIOVERSION;  Surgeon: Jacolyn Reedy, MD;  Location: [place];  Service: Cardiovascular;  Laterality: N/A;  . CARDIOVERSION N/A 07/16/2012   Procedure: CARDIOVERSION;  Surgeon: Jacolyn Reedy, MD;  Location: [facility] ENDOSCOPY;  Service: Cardiovascular;  Laterality: N/A;  . CARDIOVERSION N/A 02/27/2015   Procedure: CARDIOVERSION;  Surgeon: Jacolyn Reedy, MD;  Location: [facility] ENDOSCOPY;  Service: Cardiovascular;  Laterality: N/A;  . CARDIOVERSION N/A 09/21/2015   Procedure: CARDIOVERSION;  Surgeon: Jacolyn Reedy, MD;  Location: [facility] ENDOSCOPY;  Service: Cardiovascular;  Laterality: N/A;  . CARDIOVERSION N/A 02/22/2016   Procedure: CARDIOVERSION;  Surgeon: Dorothy Spark, MD;  Location: [facility] ENDOSCOPY;  Service: Cardiovascular;  Laterality: N/A;  . CARDIOVERSION N/A 06/12/2016   Procedure: CARDIOVERSION;  Surgeon: Jacolyn Reedy, MD;  Location: [facility] ENDOSCOPY;  Service: Cardiovascular;  Laterality: N/A;  . cataracts removed    . COLONOSCOPY    . CORONARY ARTERY BYPASS GRAFT  2003   x3  . CYSTOSCOPY WITH BIOPSY N/A 06/21/2013   Procedure: CYSTOSCOPY WITH BIOPSY;  Surgeon: Molli Hazard, MD;  Location: [facility] ORS;  Service: Urology;  Laterality: N/A;    TUR RESECTION OF POLYP BILATERAL RETROGRADE PYELOGRAM BLADDER BIOPSY    . EYE SURGERY Bilateral    Cataracts  . HIP PINNING,CANNULATED Right 06/30/2014   Procedure: CANNULATED HIP PINNING;  Surgeon: Renette Butters, MD;  Location: [place];  Service: Orthopedics;  Laterality: Right;  . LIGATION OF ARTERIOVENOUS  FISTULA Left 01/17/2016   Procedure: LIGATION OF ARTERIOVENOUS  FISTULA UPPER ARM  FISTULA;  Surgeon: [name], MD;  Location: [place];  Service: Vascular;  Laterality: Left;  . LUMBAR LAMINECTOMY  11/01  . PACEMAKER INSERTION  2012  . PROSTATE BIOPSY N/A 06/21/2013   Procedure: BIOPSY TRANSRECTAL ULTRASONIC PROSTATE (TUBP);  Surgeon: Molli Hazard, MD;  Location: [facility] ORS;  Service: Urology;  Laterality: N/A;  PROSTATE NERVE BLOCK    Social History: Social History   Tobacco Use  . Smoking status: Former Smoker    Packs/day: 0.75    Years: 24.00    Pack years: 18.00    Types: Cigarettes    Last attempt to quit: 03/12/1968    Years since quitting: 49.2  . Smokeless tobacco: Former User    Types: Snuff, Sarina Ser    Quit date: 03/11/1968  Substance Use Topics  .  Alcohol use: No    Alcohol/week: 0.0 oz  . Drug use: No   Additional social history: none Please also refer to relevant sections of EMR.  Family History: Family History  Problem Relation Age of Onset  . Cancer Mother        breast  . Diabetes Mother   . Cancer Father        prostate, esophagus    Allergies and Medications: Allergies  Allergen Reactions  . Crestor [Rosuvastatin] Other (See Comments)    "ACHING JOINTS"  . Lipitor [Atorvastatin] Other (See Comments)    "ACHING JOINTS"  . Statins Other (See Comments)    "ACHING JOINTS"  . Penicillins Rash and Other (See Comments)    Has patient had a PCN reaction causing immediate rash, facial/tongue/throat swelling, SOB or lightheadedness with hypotension: yes Has patient had a PCN reaction causing severe rash involving mucus membranes or skin necrosis: no Has patient had a PCN reaction that required hospitalization no Has patient had a PCN reaction occurring within the last 10 years: no If all of the above answers are "NO", then may proceed with Cephalosporin use.   . Tramadol Nausea Only   No current facility-administered medications on file prior to encounter.    Current Outpatient Medications on File Prior to Encounter  Medication  Sig Dispense Refill  . carboxymethylcellulose (REFRESH PLUS) 0.5 % SOLN Place 1 drop into both eyes 3 (three) times daily as needed (for dry eyes.).    . Darbepoetin Alfa (ARANESP) 100 MCG/0.5ML SOSY injection Inject 100 mcg into the skin every 21 ( twenty-one) days.    . folic acid (FOLVITE) 867 MCG tablet Take 400 mcg by mouth daily.      . furosemide (LASIX) 80 MG tablet Take 1-2 tablets (80-160 mg total) by mouth 2 (two) times daily. Take 160 mg in the morning and 80 mg in the evening.    . gabapentin (NEURONTIN) 100 MG capsule Take 100 mg by mouth 2 (two) times daily.    Marland Kitchen glimepiride (AMARYL) 1 MG tablet Take 1 tablet (1 mg total) by mouth daily before breakfast. 30 tablet 1  . Glucosamine HCl 1500 MG TABS Take 1,500 mg by mouth 2 (two) times daily.     . Horse Chestnut 300 MG CAPS Take 300 mg by mouth daily.     Marland Kitchen HYDROcodone-acetaminophen (NORCO/VICODIN) 5-325 MG tablet Take 1 tablet by mouth every 6 (six) hours as needed. For pain. 6 tablet 0  . metoprolol succinate (TOPROL-XL) 25 MG 24 hr tablet Take 25 mg by mouth daily.    . Multiple Vitamin (MULTIVITAMIN WITH MINERALS) TABS tablet Take 1 tablet by mouth daily.    . nitroGLYCERIN (NITROSTAT) 0.4 MG SL tablet Place 0.4 mg under the tongue every 5 (five) minutes as needed for chest pain.    . Omega-3 Fatty Acids (FISH OIL) 1200 MG CAPS Take 1,200 mg by mouth 2 (two) times daily.     . Polyethylene Glycol 3350 (MIRALAX PO) Take 17 g by mouth daily.     Marland Kitchen warfarin (COUMADIN) 3 MG tablet Take 1 tablet (3 mg total) by mouth daily. (Patient taking differently: Take 4 mg by mouth every evening. )      Objective: BP 137/75   Pulse 72   Temp 97.8 F (36.6 C) (Oral)   Resp 20   Ht 6\' 1"  (1.854 m)   Wt 230 lb (104.3 kg)   SpO2 100%   BMI 30.34 kg/m  Exam: General: Elderly  man, laying in bed, in NAD Eyes: EOMI, PERRLA, no scleral icterus ENTM: Nose normal, dry mucous membranes Neck: Supple, full ROM, no JVD Cardiovascular: RRR, no  murmurs, well-healed vertical incision present over the sternum Respiratory: CTAB, normal work of breathing Gastrointestinal: +BS, soft, non-distended, +mild tenderness to palpation over the right upper quadrant, Murphy's sign negative, no tenderness over McBurney's point, no rebound, no guarding MSK: Warm and well-perfused, no edema Derm: No rashes or lesions on exposed skin Neuro: Awake, alert, oriented x 3, CN 2-12 intact, 4/5 weakness in all extremities, normal sensation throughout, normal finger-to-nose testing bilaterally Psych: Normal behavior, appropriate affect  Labs and Imaging: CBC BMET  Recent Labs  Lab 05/15/17 1346  WBC 6.7  HGB 9.0*  HCT 29.6*  PLT 353   Recent Labs  Lab 05/15/17 1346  NA 136  K 4.7  CL 95*  CO2 27  BUN 106*  CREATININE 2.93*  GLUCOSE 367*  CALCIUM 8.9     -BNP 300 -trop 0.04,  -INR 1.37 -UA neg -CXR did not show any acute abnormalities  Mayo, [name], MD 05/15/2017, 5:07 PM PGY-3, [place] Intern pager: [phone], text pages welcome

## 2017-05-15 NOTE — ED Notes (Signed)
ED Provider at bedside. 

## 2017-05-15 NOTE — Progress Notes (Addendum)
ANTICOAGULATION CONSULT NOTE - Initial Consult  Pharmacy Consult for Coumadin Indication: h/o atrial fibrillation  Allergies  Allergen Reactions  . Crestor [Rosuvastatin] Other (See Comments)    "ACHING JOINTS"  . Lipitor [Atorvastatin] Other (See Comments)    "ACHING JOINTS"  . Statins Other (See Comments)    "ACHING JOINTS"  . Penicillins Rash and Other (See Comments)    Has patient had a PCN reaction causing immediate rash, facial/tongue/throat swelling, SOB or lightheadedness with hypotension: Yes Has patient had a PCN reaction causing severe rash involving mucus membranes or skin necrosis: No Has patient had a PCN reaction that required hospitalization: No Has patient had a PCN reaction occurring within the last 10 years: No If all of the above answers are "NO", then may proceed with Cephalosporin use.   . Tramadol Nausea Only    Patient Measurements: Height: 6\' 1"  (185.4 cm) Weight: 229 lb 11.5 oz (104.2 kg) IBW/kg (Calculated) : 79.9 Heparin Dosing Weight: 101 kg  Vital Signs: Temp: 98.5 F (36.9 C) (03/07 2050) Temp Source: Oral (03/07 2050) BP: 185/63 (03/07 2050) Pulse Rate: 82 (03/07 2050)  Labs: Recent Labs    05/15/17 1346  HGB 9.0*  HCT 29.6*  PLT 353  CREATININE 2.93*  TROPONINI 0.04*    Estimated Creatinine Clearance: 22.1 mL/min (A) (by C-G formula based on SCr of 2.93 mg/dL (H)).   Medical History: Past Medical History:  Diagnosis Date  . Anemia   . Atrial fibrillation ([place]) 08/07/2011   CHADS score  2 CHADS2 VASC score 4  Onset winter of 2013 Cardioversion May 2013 Reversion to a fib 3/14 amiodarone begun 07/02/12  Repeat cardioversion Jul 16, 2012    . BPH (benign prostatic hyperplasia)   . CAD (coronary artery disease) 03/06/2010   CABG with LIMA to LAD, SVG to OM-OM2  05/05/01 Dr. Roxan Hockey  Cardiac Cath 12/2009 60% left main, occluded LAD, Widely patent SVG to OM1 and 2, patent LIMA, widely patent RCA    . Cardiac pacemaker in situ     07/26/10  Inserted for chronotropic incompetence and dyspnea  RA lead  Medtronic 5076  serial number [id number]   RV lead  Medtronic 5076  serial number [id number] Medtronic Adapta RL pulse generator, SN  [id number]    . Chronic diastolic congestive heart failure, NYHA class 2 ([place]) 06/10/2016  . Chronic kidney disease (CKD), stage IV (severe) (HCC)    Dr. Mercy Moore  . Diabetes mellitus without complication ([place])    Type II  . History of skin cancer   . Hypertensive heart disease without CHF   . Lumbar disc disease 1992  . Prostate cancer ([place])   . Sinus node dysfunction (HCC)    symptomatic bradycardia    Medications:  Medications Prior to Admission  Medication Sig Dispense Refill Last Dose  . carboxymethylcellulose (REFRESH PLUS) 0.5 % SOLN Place 1 drop into both eyes 3 (three) times daily as needed (for dry eyes.).   Unk at [employer]  . Darbepoetin Alfa (ARANESP) 100 MCG/0.5ML SOSY injection Inject 100 mcg into the skin every 21 ( twenty-one) days.   04/18/2017 at Unknown time  . folic acid (FOLVITE) 166 MCG tablet Take 400 mcg by mouth daily.     05/15/2017 at am  . furosemide (LASIX) 80 MG tablet Take 1-2 tablets (80-160 mg total) by mouth 2 (two) times daily. Take 160 mg in the morning and 80 mg in the evening. (Patient taking differently: Take 160 mg by mouth 2 (two) times daily. )  05/15/2017 at am  . glimepiride (AMARYL) 1 MG tablet Take 1 tablet (1 mg total) by mouth daily before breakfast. (Patient taking differently: Take 0.5 mg by mouth daily before breakfast. ) 30 tablet 1 05/15/2017 at Unknown time  . Horse Chestnut 300 MG CAPS Take 300 mg by mouth daily.   Past Month at Unknown time  . HYDROcodone-acetaminophen (NORCO/VICODIN) 5-325 MG tablet Take 1 tablet by mouth every 6 (six) hours as needed. For pain. (Patient taking differently: Take 1 tablet by mouth every 6 (six) hours as needed (for pain). ) 6 tablet 0 05/15/2017 at am  . metolazone (ZAROXOLYN) 2.5 MG tablet Take 2.5 mg by mouth See  admin instructions. "2.5 mg by mouth once a day as needed/based on weight"  10 Past Month at Unknown time  . metoprolol succinate (TOPROL-XL) 25 MG 24 hr tablet Take 25 mg by mouth daily.   05/15/2017 at 0730  . Multiple Vitamin (MULTIVITAMIN WITH MINERALS) TABS tablet Take 1 tablet by mouth daily.   05/15/2017 at Unknown time  . nitroGLYCERIN (NITROSTAT) 0.4 MG SL tablet Place 0.4 mg under the tongue every 5 (five) minutes as needed for chest pain.   Unk at [employer]  . Omega-3 Fatty Acids (FISH OIL) 1200 MG CAPS Take 1,200 mg by mouth 2 (two) times daily.    05/15/2017 at Unknown time  . OXYGEN Inhale 3 L into the lungs continuous.   Continous at Continuous  . Polyethylene Glycol 3350 (MIRALAX PO) Take 17 g by mouth daily.    05/15/2017 at Unknown time  . ULORIC 40 MG tablet Take 40 mg by mouth daily.  2 05/15/2017 at Unknown time  . warfarin (COUMADIN) 4 MG tablet Take 4 mg by mouth in the evening on Sun/Mon/Tues/Wed/Thurs/Sat and 6 mg on Fri  10 05/14/2017 at 1730  . warfarin (COUMADIN) 3 MG tablet Take 1 tablet (3 mg total) by mouth daily. (Patient not taking: Reported on 05/15/2017)   Not Taking at Unknown time   Scheduled:  . [START ON 05/16/2017] darbepoetin (ARANESP) injection - NON-DIALYSIS  100 mcg Subcutaneous Q Fri-1800  . insulin aspart  0-5 Units Subcutaneous QHS  . [START ON 05/16/2017] insulin aspart  0-9 Units Subcutaneous TID WC  . [START ON 05/16/2017] metoprolol succinate  25 mg Oral Daily    Assessment: 84 y.o male presented [facility] ED on 05/15/17 with generalized weakness x 2 mos.  He has a h/o atrial fibrillation and take coumadin PTA.  Hgb 9.0 and pltc wnl at 353K.   PTA Coumadin dose is  4mg  daily except 6mg  qFri. Last taken 3/6 PTA.   Goal of Therapy:  INR 2-3 Monitor platelets by anticoagulation protocol: Yes   Plan:  F/u INR pending, then dose coumadin tonight.  Daily INR   Nicole Cella, RPh Clinical Pharmacist Pager: [phone] 05/15/2017,9:17 PM   ADDENDUM:  INR = 1.37.  Subtherapeutic  PLAN:  Coumadin 6 mg po tonight x1 Daily INR  Nicole Cella, RPh Clinical Pharmacist Pager: [phone] 05/15/2017 10:27 PM

## 2017-05-15 NOTE — ED Notes (Signed)
Dinner tray provided

## 2017-05-15 NOTE — Consult Note (Signed)
[facility] Consult Note     Date: 05/15/2017                  Patient Name:  Christopher [name]  MRN: [id number]  DOB: 1926/05/12  Age / Sex: 84 y.o., male         PCP: Prince Solian, MD                 Service Requesting Consult: FMTS, Dr. Erin Hearing                 Reason for Consult: Progressive CKD            Chief Complaint:  Weakness   HPI: Pt is an 25M with a PMH significant for HTN, HLD, CAD s/p CABG, Afib, anemia, and CKD V who is now seen in consultation at the request of Dr. Erin Hearing for eval and recs re: progressive CKD.    Briefly, pt has been progressively weaker over the past month.  He has not been able to stand up without his knees giving away out from under him.  This got worse in the past week and this AM he wasn't able to get out of bed so he came to the ED.    He is followed by Dr. Moshe Cipro and used to follow with Dr. Mercy Moore.  At his last visit with Dr. Moshe Cipro ~2 months ago, he expressed that he would never want to do dialysis as he was "too old for that".  Of note, he initially was going to do dialysis and had three accesses placed, all of which failed.  Last Cr 2.8 and BUN 88 at our office 10/2016.  He notes decreased appetite and PO intake over the past week.  Has had some food aversions as well in the past week.  Noticed that he's having "jerking" movements.    Past Medical History:  Diagnosis Date  . Anemia   . Atrial fibrillation ([place]) 08/07/2011   CHADS score  2 CHADS2 VASC score 4  Onset winter of 2013 Cardioversion May 2013 Reversion to a fib 3/14 amiodarone begun 07/02/12  Repeat cardioversion Jul 16, 2012    . BPH (benign prostatic hyperplasia)   . CAD (coronary artery disease) 03/06/2010   CABG with LIMA to LAD, SVG to OM-OM2  05/05/01 Dr. Roxan Hockey  Cardiac Cath 12/2009 60% left main, occluded LAD, Widely patent SVG to OM1 and 2, patent LIMA, widely patent RCA    . Cardiac pacemaker in situ    07/26/10  Inserted for  chronotropic incompetence and dyspnea  RA lead  Medtronic 5076  serial number [id number]   RV lead  Medtronic 5076  serial number [id number] Medtronic Adapta RL pulse generator, SN  [id number]    . Chronic diastolic congestive heart failure, NYHA class 2 ([place]) 06/10/2016  . Chronic kidney disease (CKD), stage IV (severe) (H[facility])    Dr. Mercy Moore  . Diabetes mellitus without complication ([place])    Type II  . History of skin cancer   . Hypertensive heart disease without CHF   . Lumbar disc disease 1992  . Prostate cancer ([place])   . Sinus node dysfunction ([facility])    symptomatic bradycardia    Past Surgical History:  Procedure Laterality Date  . APPENDECTOMY  1980  . AV FISTULA PLACEMENT Left 11/08/2015   Procedure: ARTERIOVENOUS (AV) FISTULA CREATION;  Surgeon: [name], MD;  Location: [facility];  Service: Vascular;  Laterality: Left;  . AV FISTULA PLACEMENT Left  01/03/2016   Procedure: Creation of BRACHIOCEPHALIC ARTERIOVENOUS (AV) FISTULA Left arm;  Surgeon: [name], MD;  Location: [facility] OR;  Service: Vascular;  Laterality: Left;  . AV FISTULA PLACEMENT Right 05/14/2016   Procedure: RADIOCEPHALIC ARTERIOVENOUS (AV) FISTULA CREATION RIGHT LOWER ARM;  Surgeon: Elam Dutch, MD;  Location: [place];  Service: Vascular;  Laterality: Right;  . CARDIAC CATHETERIZATION  2003  . CARDIOVERSION  08/08/2011   Procedure: CARDIOVERSION;  Surgeon: Jacolyn Reedy, MD;  Location: [place];  Service: Cardiovascular;  Laterality: N/A;  . CARDIOVERSION N/A 07/16/2012   Procedure: CARDIOVERSION;  Surgeon: Jacolyn Reedy, MD;  Location: [facility] ENDOSCOPY;  Service: Cardiovascular;  Laterality: N/A;  . CARDIOVERSION N/A 02/27/2015   Procedure: CARDIOVERSION;  Surgeon: Jacolyn Reedy, MD;  Location: [facility] ENDOSCOPY;  Service: Cardiovascular;  Laterality: N/A;  . CARDIOVERSION N/A 09/21/2015   Procedure: CARDIOVERSION;  Surgeon: Jacolyn Reedy, MD;  Location: [facility];  Service: Cardiovascular;  Laterality: N/A;  .  CARDIOVERSION N/A 02/22/2016   Procedure: CARDIOVERSION;  Surgeon: Dorothy Spark, MD;  Location: [facility] ENDOSCOPY;  Service: Cardiovascular;  Laterality: N/A;  . CARDIOVERSION N/A 06/12/2016   Procedure: CARDIOVERSION;  Surgeon: Jacolyn Reedy, MD;  Location: [facility] ENDOSCOPY;  Service: Cardiovascular;  Laterality: N/A;  . cataracts removed    . COLONOSCOPY    . CORONARY ARTERY BYPASS GRAFT  2003   x3  . CYSTOSCOPY WITH BIOPSY N/A 06/21/2013   Procedure: CYSTOSCOPY WITH BIOPSY;  Surgeon: Molli Hazard, MD;  Location: [facility];  Service: Urology;  Laterality: N/A;    TUR RESECTION OF POLYP BILATERAL RETROGRADE PYELOGRAM BLADDER BIOPSY    . EYE SURGERY Bilateral    Cataracts  . HIP PINNING,CANNULATED Right 06/30/2014   Procedure: CANNULATED HIP PINNING;  Surgeon: Renette Butters, MD;  Location: [place];  Service: Orthopedics;  Laterality: Right;  . LIGATION OF ARTERIOVENOUS  FISTULA Left 01/17/2016   Procedure: LIGATION OF ARTERIOVENOUS  FISTULA UPPER ARM FISTULA;  Surgeon: [name], MD;  Location: [place];  Service: Vascular;  Laterality: Left;  . LUMBAR LAMINECTOMY  11/01  . PACEMAKER INSERTION  2012  . PROSTATE BIOPSY N/A 06/21/2013   Procedure: BIOPSY TRANSRECTAL ULTRASONIC PROSTATE (TUBP);  Surgeon: Molli Hazard, MD;  Location: [facility];  Service: Urology;  Laterality: N/A;  PROSTATE NERVE BLOCK    Family History  Problem Relation Age of Onset  . Cancer Mother        breast  . Diabetes Mother   . Cancer Father        prostate, esophagus   Social History:  reports that he quit smoking about 49 years ago. His smoking use included cigarettes. He has a 18.00 pack-year smoking history. He quit smokeless tobacco use about 49 years ago. His smokeless tobacco use included snuff and chew. He reports that he does not drink alcohol or use drugs.  Allergies:  Allergies  Allergen Reactions  . Crestor [Rosuvastatin] Other (See Comments)    "ACHING JOINTS"  . Lipitor  [Atorvastatin] Other (See Comments)    "ACHING JOINTS"  . Statins Other (See Comments)    "ACHING JOINTS"  . Penicillins Rash and Other (See Comments)    Has patient had a PCN reaction causing immediate rash, facial/tongue/throat swelling, SOB or lightheadedness with hypotension: Yes Has patient had a PCN reaction causing severe rash involving mucus membranes or skin necrosis: No Has patient had a PCN reaction that required hospitalization: No Has patient had a PCN  reaction occurring within the last 10 years: No If all of the above answers are "NO", then may proceed with Cephalosporin use.   . Tramadol Nausea Only     (Not in a hospital admission)  Results for orders placed or performed during the hospital encounter of 05/15/17 (from the past 48 hour(s))  Basic metabolic panel     Status: Abnormal   Collection Time: 05/15/17  1:46 PM  Result Value Ref Range   Sodium 136 135 - 145 mmol/L   Potassium 4.7 3.5 - 5.1 mmol/L   Chloride 95 (L) 101 - 111 mmol/L   CO2 27 22 - 32 mmol/L   Glucose, Bld 367 (H) 65 - 99 mg/dL   BUN 106 (H) 6 - 20 mg/dL   Creatinine, Ser 2.93 (H) 0.61 - 1.24 mg/dL   Calcium 8.9 8.9 - 10.3 mg/dL   GFR calc non Af Amer 18 (L) >60 mL/min   GFR calc Af Amer 21 (L) >60 mL/min    Comment: (NOTE) The eGFR has been calculated using the CKD EPI equation. This calculation has not been validated in all clinical situations. eGFR's persistently <60 mL/min signify possible Chronic Kidney Disease.    Anion gap 14 5 - 15    Comment: Performed at [facility] [address], [place], [place] [address]  CBC     Status: Abnormal   Collection Time: 05/15/17  1:46 PM  Result Value Ref Range   WBC 6.7 4.0 - 10.5 K/uL   RBC 2.90 (L) 4.22 - 5.81 MIL/uL   Hemoglobin 9.0 (L) 13.0 - 17.0 g/dL   HCT 29.6 (L) 39.0 - 52.0 %   MCV 102.1 (H) 78.0 - 100.0 fL   MCH 31.0 26.0 - 34.0 pg   MCHC 30.4 30.0 - 36.0 g/dL   RDW 18.4 (H) 11.5 - 15.5 %   Platelets 353 150 - 400 K/uL     Comment: Performed at [facility] [address], [place], [place] [address]  Troponin I     Status: Abnormal   Collection Time: 05/15/17  1:46 PM  Result Value Ref Range   Troponin I 0.04 (HH) <0.03 ng/mL    Comment: CRITICAL RESULT CALLED TO, READ BACK BY AND VERIFIED WITH: LAMOYNE HESSEL RN [id number] 05/15/2017 BY A BENNETT Performed at [facility], [place] [address] [place], [place] [address]   Hepatic function panel     Status: Abnormal   Collection Time: 05/15/17  1:46 PM  Result Value Ref Range   Total Protein 6.6 6.5 - 8.1 g/dL   Albumin 2.9 (L) 3.5 - 5.0 g/dL   AST 43 (H) 15 - 41 U/L   ALT 30 17 - 63 U/L   Alkaline Phosphatase 58 38 - 126 U/L   Total Bilirubin 1.1 0.3 - 1.2 mg/dL   Bilirubin, Direct 0.1 0.1 - 0.5 mg/dL   Indirect Bilirubin 1.0 (H) 0.3 - 0.9 mg/dL    Comment: Performed at [facility] [address], [place], [place] [address]  Brain natriuretic peptide     Status: Abnormal   Collection Time: 05/15/17  1:46 PM  Result Value Ref Range   B Natriuretic Peptide 300.4 (H) 0.0 - 100.0 pg/mL    Comment: Performed at [facility] [address], [place], [place] [address]  Urinalysis, Routine w reflex microscopic     Status: Abnormal   Collection Time: 05/15/17  2:17 PM  Result Value Ref Range  Color, Urine YELLOW YELLOW   APPearance CLEAR CLEAR   Specific Gravity, Urine 1.010 1.005 - 1.030   pH 6.0 5.0 - 8.0   Glucose, UA >=500 (A) NEGATIVE mg/dL   Hgb urine dipstick NEGATIVE NEGATIVE   Bilirubin Urine NEGATIVE NEGATIVE   Ketones, ur NEGATIVE NEGATIVE mg/dL   Protein, ur NEGATIVE NEGATIVE mg/dL   Nitrite NEGATIVE NEGATIVE   Leukocytes, UA NEGATIVE NEGATIVE   RBC / HPF 0-5 0 - 5 RBC/hpf   WBC, UA 0-5 0 - 5 WBC/hpf   Bacteria, UA NONE SEEN NONE SEEN   Squamous Epithelial / LPF NONE SEEN NONE SEEN    Comment: Performed at [facility], [place] [address], [place], [place] [address]   Dg Chest 2 View  Result Date: 05/15/2017 CLINICAL  DATA:  Patient experiencing SOB for 2-3 months. Increasing weakness and aches for 1 year. Chest pain that comes and goes. HTN takes medication. Diabetic takes medication. Hx of A-fib, CAD, cardiac pacemaker, CHF. EXAM: CHEST - 2 VIEW COMPARISON:  06/10/2016 FINDINGS: Status post median sternotomy and CABG. Patient's LEFT-sided transvenous pacemaker leads to the RIGHT atrium and RIGHT ventricle. Shallow lung inflation. The heart is enlarged. There is mild streaky atelectasis at the lung bases. No consolidations or evidence for pulmonary edema. IMPRESSION: Cardiomegaly.  No evidence for acute  abnormality. Electronically Signed   By: Nolon Nations M.D.   On: 05/15/2017 14:55    ROS: all other systems reviewed and are negative except as per HPI  Blood pressure 130/83, pulse 71, temperature 97.8 F (36.6 C), temperature source Oral, resp. rate 17, height 6' 1"  (1.854 m), weight 104.3 kg (230 lb), SpO2 100 %. Physical Exam  GEN older man, lying in bed, NAD HEENT EOMI, PERRL, dry MMM NECK no JVD PULM normal WOB, dry crackles bilateral bases, no w/r CV RRR, no r/g ABD protuberant, obese, NABS EXT no LE edema NEURO AAO x 3 but + asterixis SKIN warm and dry MSK + OA changes esp bilateral hands  Assessment/Plan  1.  Weakness: Pt with progressive weakness- I suspect uremia as the main culprit; he has frailty and debility and I think that may be compounding the issue as he is quite deconditioned.  Rec PT/OT.  Assessing uremia as below.  2.  Progressive CKD:  BUN  106 and Cr 2.9 now; has progressed a little bit in the last few months.  His uremic symptoms are certainly worse.  He hasn't been eating and drinking well the past week so we'll see if BUN can be improved a little bit with hydration- will not do anymore than 75 cc/ hr x 12 hrs.  He is very clear that he doesn't want to do dialysis.  If no improvement with gentle hydration overnight, I recommend pall care c/s.  Please note- the fluids are a  temporary measure.  To be sure, his CKD is such that he will need his lasix restarted during this admission.  3.  Anemia:  Getting aranesp 100 mcg q 4 weeks- last dose 04/18/2017.  Will order for tomorrow.  4.  Chronic dCHF: CXR without any evidence of vol overload and pt is not vol overloaded on exam.  Holding Lasix for tonight- reassessing in AM.    5.  Dispo: pending- ? SNF vs home with PT vs pall care?  Madelon Lips MD [facility] pgr [phone] 05/15/2017, 8:20 PM

## 2017-05-15 NOTE — ED Provider Notes (Signed)
[facility] EMERGENCY DEPARTMENT Provider Note   CSN: [id number] Arrival date & time: 05/15/17  1331     History   Chief Complaint Chief Complaint  Patient presents with  . Weakness  . Hyperglycemia    HPI Christopher [name] is a 84 y.o. male.  HPI   84 year old male with past medical history of A. fib, coronary disease, CHF, here with generalized weakness.  The patient states that over the last several months, he has had progressively worsening function and weakness.  The patient has had acute worsening of this weakness over the last 1-2 days.  Patient states that over the last several days, he has had worsening weakness.  He is now been unable to even get himself out of bed.  He has had associated poor appetite.  No change in bowel habits.  He had some occasional cough and shortness of breath earlier in the week, but this has resolved.  No abdominal pain, nausea, or vomiting.  Denies recent medication changes.  He has been taking his medications as prescribed.  Past Medical History:  Diagnosis Date  . Anemia   . Atrial fibrillation ([place]) 08/07/2011   CHADS score  2 CHADS2 VASC score 4  Onset winter of 2013 Cardioversion May 2013 Reversion to a fib 3/14 amiodarone begun 07/02/12  Repeat cardioversion Jul 16, 2012    . BPH (benign prostatic hyperplasia)   . CAD (coronary artery disease) 03/06/2010   CABG with LIMA to LAD, SVG to OM-OM2  05/05/01 Dr. Roxan Hockey  Cardiac Cath 12/2009 60% left main, occluded LAD, Widely patent SVG to OM1 and 2, patent LIMA, widely patent RCA    . Cardiac pacemaker in situ    07/26/10  Inserted for chronotropic incompetence and dyspnea  RA lead  Medtronic 5076  serial number [id number]   RV lead  Medtronic 5076  serial number [id number] Medtronic Adapta RL pulse generator, SN  [id number]    . Chronic diastolic congestive heart failure, NYHA class 2 ([place]) 06/10/2016  . Chronic kidney disease (CKD), stage IV (severe) (HCC)    Dr. Mercy Moore  .  Diabetes mellitus without complication ([facility])    Type II  . History of skin cancer   . Hypertensive heart disease without CHF   . Lumbar disc disease 1992  . Prostate cancer ([place])   . Sinus node dysfunction (HCC)    symptomatic bradycardia    Patient Active Problem List   Diagnosis Date Noted  . CHF (congestive heart failure) ([place]) 06/11/2016  . Chronic diastolic congestive heart failure, NYHA class 2 ([place]) 06/10/2016  . Acute pulmonary edema ([place]) 05/17/2016  . Obesity (BMI 30-39.9) 05/17/2016  . Anemia of chronic disease 10/05/2015  . Hypoxia 07/20/2015  . Acute on chronic diastolic CHF (congestive heart failure), NYHA class 2 ([place]) 07/24/2013  . DM type 2 causing renal disease ([place]) 07/24/2013  . Pulmonary nodule with restrictive lung disease 07/24/2013  . Malignant neoplasm of prostate ([place]) 07/16/2013  . History of amiodarone therapy 07/16/2012  . Paroxysmal atrial fibrillation ([place]) 08/07/2011  . Lumbar disc disease   . Hyperlipidemia   . Hypertensive heart disease without CHF   . CKD (chronic kidney disease), stage V ([place])   . Long-term (current) use of anticoagulants   . BPH (benign prostatic hyperplasia)   . Cardiac pacemaker in situ   . CAD (coronary artery disease) 03/06/2010    Past Surgical History:  Procedure Laterality Date  . APPENDECTOMY  1980  . AV FISTULA  PLACEMENT Left 11/08/2015   Procedure: ARTERIOVENOUS (AV) FISTULA CREATION;  Surgeon: [name], MD;  Location: [facility];  Service: Vascular;  Laterality: Left;  . AV FISTULA PLACEMENT Left 01/03/2016   Procedure: Creation of BRACHIOCEPHALIC ARTERIOVENOUS (AV) FISTULA Left arm;  Surgeon: [name], MD;  Location[facility] OR;  Service: Vascular;  Laterality: Left;  . AV FISTULA PLACEMENT Right 05/14/2016   Procedure: RADIOCEPHALIC ARTERIOVENOUS (AV) FISTULA CREATION RIGHT LOWER ARM;  Surgeon: Elam Dutch, MD;  Location: [facility];  Service: Vascular;  Laterality: Right;  . CARDIAC CATHETERIZATION  2003  .  CARDIOVERSION  08/08/2011   Procedure: CARDIOVERSION;  Surgeon: Jacolyn Reedy, MD;  Location: [place] [facility];  Service: Cardiovascular;  Laterality: N/A;  . CARDIOVERSION N/A 07/16/2012   Procedure: CARDIOVERSION;  Surgeon: Jacolyn Reedy, MD;  Location: [facility] ENDOSCOPY;  Service: Cardiovascular;  Laterality: N/A;  . CARDIOVERSION N/A 02/27/2015   Procedure: CARDIOVERSION;  Surgeon: Jacolyn Reedy, MD;  Location: [facility] ENDOSCOPY;  Service: Cardiovascular;  Laterality: N/A;  . CARDIOVERSION N/A 09/21/2015   Procedure: CARDIOVERSION;  Surgeon: Jacolyn Reedy, MD;  Location: [facility] ENDOSCOPY;  Service: Cardiovascular;  Laterality: N/A;  . CARDIOVERSION N/A 02/22/2016   Procedure: CARDIOVERSION;  Surgeon: Dorothy Spark, MD;  Location[facility] ENDOSCOPY;  Service: Cardiovascular;  Laterality: N/A;  . CARDIOVERSION N/A 06/12/2016   Procedure: CARDIOVERSION;  Surgeon: Jacolyn Reedy, MD;  Location: [facility] ENDOSCOPY;  Service: Cardiovascular;  Laterality: N/A;  . cataracts removed    . COLONOSCOPY    . CORONARY ARTERY BYPASS GRAFT  2003   x3  . CYSTOSCOPY WITH BIOPSY N/A 06/21/2013   Procedure: CYSTOSCOPY WITH BIOPSY;  Surgeon: Molli Hazard, MD;  Location: [facility] ORS;  Service: Urology;  Laterality: N/A;    TUR RESECTION OF POLYP BILATERAL RETROGRADE PYELOGRAM BLADDER BIOPSY    . EYE SURGERY Bilateral    Cataracts  . HIP PINNING,CANNULATED Right 06/30/2014   Procedure: CANNULATED HIP PINNING;  Surgeon: Renette Butters, MD;  Location: [facility];  Service: Orthopedics;  Laterality: Right;  . LIGATION OF ARTERIOVENOUS  FISTULA Left 01/17/2016   Procedure: LIGATION OF ARTERIOVENOUS  FISTULA UPPER ARM FISTULA;  Surgeon: [name], MD;  Location: [place];  Service: Vascular;  Laterality: Left;  . LUMBAR LAMINECTOMY  11/01  . PACEMAKER INSERTION  2012  . PROSTATE BIOPSY N/A 06/21/2013   Procedure: BIOPSY TRANSRECTAL ULTRASONIC PROSTATE (TUBP);  Surgeon: Molli Hazard, MD;  Location: [facility] OR[facility];  Service: Urology;   Laterality: N/A;  PROSTATE NERVE BLOCK       Home Medications    Prior to Admission medications   Medication Sig Start Date End Date Taking? Authorizing Provider  carboxymethylcellulose (REFRESH PLUS) 0.5 % SOLN Place 1 drop into both eyes 3 (three) times daily as needed (for dry eyes.).    [provider]  cycloSPORINE (RESTASIS) 0.05 % ophthalmic emulsion 1 drop 2 (two) times daily.    [provider]  Darbepoetin Alfa (ARANESP) 100 MCG/0.5ML SOSY injection Inject 100 mcg into the skin every 21 ( twenty-one) days.    [provider]  folic acid (FOLVITE) 024 MCG tablet Take 400 mcg by mouth daily.      [provider]  furosemide (LASIX) 80 MG tablet Take 1-2 tablets (80-160 mg total) by mouth 2 (two) times daily. Take 160 mg in the morning and 80 mg in the evening. 06/13/16   Rama, Venetia Maxon, MD  gabapentin (NEURONTIN) 100 MG capsule Take 100  mg by mouth 2 (two) times daily.    [provider]  glimepiride (AMARYL) 1 MG tablet Take 1 tablet (1 mg total) by mouth daily before breakfast. 07/13/14   Love, Ivan Anchors, PA-C  Glucosamine HCl 1500 MG TABS Take 1,500 mg by mouth 2 (two) times daily.     [provider]  [place] 300 MG CAPS Take 300 mg by mouth daily.     [provider]  HYDROcodone-acetaminophen (NORCO/VICODIN) 5-325 MG tablet Take 1 tablet by mouth every 6 (six) hours as needed. For pain. 05/14/16   Alvia Grove, PA-C  metoprolol succinate (TOPROL-XL) 25 MG 24 hr tablet Take 25 mg by mouth daily. 11/30/15   [provider]  Multiple Vitamin (MULTIVITAMIN WITH MINERALS) TABS tablet Take 1 tablet by mouth daily.    [provider]  nitroGLYCERIN (NITROSTAT) 0.4 MG SL tablet Place 0.4 mg under the tongue every 5 (five) minutes as needed for chest pain. 10/26/15   [provider]  Omega-3 Fatty Acids (FISH OIL) 1200 MG CAPS Take 1,200 mg by mouth 2 (two) times daily.     [provider]  Polyethylene Glycol 3350 (MIRALAX PO) Take 17 g by mouth daily.     [provider]  warfarin (COUMADIN) 3 MG tablet Take 1 tablet (3 mg total) by mouth daily. Patient taking differently: Take 4 mg by mouth every evening.  05/15/16   Alvia Grove, PA-C    Family History Family History  Problem Relation Age of Onset  . Cancer Mother        breast  . Diabetes Mother   . Cancer Father        prostate, esophagus    Social History Social History   Tobacco Use  . Smoking status: Former Smoker    Packs/day: 0.75    Years: 24.00    Pack years: 18.00    Types: Cigarettes    Last attempt to quit: 03/12/1968    Years since quitting: 49.2  . Smokeless tobacco: Former User    Types: Snuff, Sarina Ser    Quit date: 03/11/1968  Substance Use Topics  . Alcohol use: No    Alcohol/week: 0.0 oz  . Drug use: No     Allergies   Crestor [rosuvastatin]; Lipitor [atorvastatin]; Statins; Penicillins; and Tramadol   Review of Systems Review of Systems  Constitutional: Positive for appetite change and fatigue. Negative for chills and fever.  HENT: Negative for ear pain and sore throat.   Eyes: Negative for pain and visual disturbance.  Respiratory: Negative for cough and shortness of breath.   Cardiovascular: Negative for chest pain and palpitations.  Gastrointestinal: Negative for abdominal pain and vomiting.  Genitourinary: Negative for dysuria and hematuria.  Musculoskeletal: Negative for arthralgias and back pain.  Skin: Negative for color change and rash.  Neurological: Positive for weakness. Negative for seizures and syncope.  All other systems reviewed and are negative.    Physical Exam Updated Vital Signs BP (!) 143/70   Pulse 71   Temp 97.8 F (36.6 C) (Oral)   Resp 20   Ht 6\' 1"  (1.854 m)   Wt 104.3 kg (230 lb)   SpO2 99%   BMI 30.34 kg/m   Physical Exam  Constitutional: He is oriented to person, place, and time. He appears well-developed and  well-nourished. No distress.  HENT:  Head: Normocephalic and atraumatic.  Dry MM  Eyes: Conjunctivae are normal.  Neck: Neck supple.  Cardiovascular: Normal  rate, regular rhythm and normal heart sounds. Exam reveals no friction rub.  No murmur heard. Pulmonary/Chest: Effort normal. No respiratory distress. He has no wheezes. He has rales in the right middle field, the right lower field, the left middle field and the left lower field.  Abdominal: He exhibits no distension.  Musculoskeletal: He exhibits no edema.  Neurological: He is alert and oriented to person, place, and time. He exhibits normal muscle tone.  Skin: Skin is warm. Capillary refill takes less than 2 seconds.  Psychiatric: He has a normal mood and affect.  Nursing note and vitals reviewed.    ED Treatments / Results  Labs (all labs ordered are listed, but only abnormal results are displayed) Labs Reviewed  BASIC METABOLIC PANEL - Abnormal; Notable for the following components:      Result Value   Chloride 95 (*)    Glucose, Bld 367 (*)    BUN 106 (*)    Creatinine, Ser 2.93 (*)    GFR calc non Af Amer 18 (*)    GFR calc Af Amer 21 (*)    All other components within normal limits  CBC - Abnormal; Notable for the following components:   RBC 2.90 (*)    Hemoglobin 9.0 (*)    HCT 29.6 (*)    MCV 102.1 (*)    RDW 18.4 (*)    All other components within normal limits  URINALYSIS, ROUTINE W REFLEX MICROSCOPIC - Abnormal; Notable for the following components:   Glucose, UA >=500 (*)    All other components within normal limits  TROPONIN I - Abnormal; Notable for the following components:   Troponin I 0.04 (*)    All other components within normal limits  HEPATIC FUNCTION PANEL - Abnormal; Notable for the following components:   Albumin 2.9 (*)    AST 43 (*)    Indirect Bilirubin 1.0 (*)    All other components within normal limits  BRAIN NATRIURETIC PEPTIDE - Abnormal; Notable for the following components:   B  Natriuretic Peptide 300.4 (*)    All other components within normal limits  CBG MONITORING, ED    EKG  EKG Interpretation  Date/Time:  Thursday May 15 2017 13:41:01 EST Ventricular Rate:  74 PR Interval:    QRS Duration: 126 QT Interval:  433 QTC Calculation: 481 R Axis:   11 Text Interpretation:  Atrial fibrillation Right bundle branch block Since last EKG, afib has replaced paced rhythm Confirmed by Duffy Bruce [phone]) on 05/15/2017 1:48:41 PM Also confirmed by Duffy Bruce [phone]), editor Philomena Doheny [phone])  on 05/15/2017 4:12:08 PM       Radiology Dg Chest 2 View  Result Date: 05/15/2017 CLINICAL DATA:  Patient experiencing SOB for 2-3 months. Increasing weakness and aches for 1 year. Chest pain that comes and goes. HTN takes medication. Diabetic takes medication. Hx of A-fib, CAD, cardiac pacemaker, CHF. EXAM: CHEST - 2 VIEW COMPARISON:  06/10/2016 FINDINGS: Status post median sternotomy and CABG. Patient's LEFT-sided transvenous pacemaker leads to the RIGHT atrium and RIGHT ventricle. Shallow lung inflation. The heart is enlarged. There is mild streaky atelectasis at the lung bases. No consolidations or evidence for pulmonary edema. IMPRESSION: Cardiomegaly.  No evidence for acute  abnormality. Electronically Signed   By: Nolon Nations M.D.   On: 05/15/2017 14:55    Procedures Procedures (including critical care time)  Medications Ordered in ED Medications  0.9 %  sodium chloride infusion (not administered)     Initial Impression /  Assessment and Plan / ED Course  I have reviewed the triage vital signs and the nursing notes.  Pertinent labs & imaging results that were available during my care of the patient were reviewed by me and considered in my medical decision making (see chart for details).    84 year old male here with generalized weakness.  He is unable to ambulate.  He is having difficulty caring for himself at home.  Lab work shows mild acute on  chronic kidney injury with BUN of 106.  Of note, this is significantly higher than he has ever been in the past.  Lab work is otherwise reassuring.  Chest x-ray clear.  Denies any head trauma and neurological exam is nonfocal.  No apparent infectious triggers.  I discussed with Dr. Hollie Salk of nephrology.  Suspect that this could be contributing to his weakness.  However, he has declined dialysis in the past.  Will give fluids, plan for admission.  Patient may benefit from inpatient palliative care consult.  Final Clinical Impressions(s) / ED Diagnoses   Final diagnoses:  Acute renal failure superimposed on stage 5 chronic kidney disease, not on chronic dialysis, unspecified acute renal failure type ([place])  Uremia     Duffy Bruce, MD 05/15/17 [phone]

## 2017-05-16 ENCOUNTER — Encounter (HOSPITAL_COMMUNITY): Payer: Medicare Other

## 2017-05-16 ENCOUNTER — Encounter (HOSPITAL_COMMUNITY): Payer: Self-pay | Admitting: *Deleted

## 2017-05-16 DIAGNOSIS — N179 Acute kidney failure, unspecified: Secondary | ICD-10-CM

## 2017-05-16 DIAGNOSIS — N19 Unspecified kidney failure: Secondary | ICD-10-CM

## 2017-05-16 DIAGNOSIS — N185 Chronic kidney disease, stage 5: Secondary | ICD-10-CM

## 2017-05-16 DIAGNOSIS — Z515 Encounter for palliative care: Secondary | ICD-10-CM

## 2017-05-16 LAB — CBC
HEMATOCRIT: 30 % — AB (ref 39.0–52.0)
Hemoglobin: 9 g/dL — ABNORMAL LOW (ref 13.0–17.0)
MCH: 30.8 pg (ref 26.0–34.0)
MCHC: 30 g/dL (ref 30.0–36.0)
MCV: 102.7 fL — ABNORMAL HIGH (ref 78.0–100.0)
Platelets: 356 10*3/uL (ref 150–400)
RBC: 2.92 MIL/uL — ABNORMAL LOW (ref 4.22–5.81)
RDW: 18.3 % — ABNORMAL HIGH (ref 11.5–15.5)
WBC: 4.8 10*3/uL (ref 4.0–10.5)

## 2017-05-16 LAB — COMPREHENSIVE METABOLIC PANEL
ALBUMIN: 3 g/dL — AB (ref 3.5–5.0)
ALT: 28 U/L (ref 17–63)
AST: 38 U/L (ref 15–41)
Alkaline Phosphatase: 49 U/L (ref 38–126)
Anion gap: 12 (ref 5–15)
BUN: 99 mg/dL — AB (ref 6–20)
CHLORIDE: 97 mmol/L — AB (ref 101–111)
CO2: 28 mmol/L (ref 22–32)
Calcium: 9 mg/dL (ref 8.9–10.3)
Creatinine, Ser: 2.68 mg/dL — ABNORMAL HIGH (ref 0.61–1.24)
GFR calc Af Amer: 23 mL/min — ABNORMAL LOW (ref >60)
GFR calc non Af Amer: 20 mL/min — ABNORMAL LOW (ref >60)
GLUCOSE: 216 mg/dL — AB (ref 65–99)
POTASSIUM: 4.8 mmol/L (ref 3.5–5.1)
Sodium: 137 mmol/L (ref 135–145)
Total Bilirubin: 1.1 mg/dL (ref 0.3–1.2)
Total Protein: 6.1 g/dL — ABNORMAL LOW (ref 6.5–8.1)

## 2017-05-16 LAB — HEMOGLOBIN A1C
Hgb A1c MFr Bld: 8.6 % — ABNORMAL HIGH (ref 4.8–5.6)
Mean Plasma Glucose: 200 mg/dL

## 2017-05-16 LAB — GLUCOSE, CAPILLARY
GLUCOSE-CAPILLARY: 278 mg/dL — AB (ref 65–99)
Glucose-Capillary: 232 mg/dL — ABNORMAL HIGH (ref 65–99)
Glucose-Capillary: 250 mg/dL — ABNORMAL HIGH (ref 65–99)
Glucose-Capillary: 424 mg/dL — ABNORMAL HIGH (ref 65–99)

## 2017-05-16 LAB — SEDIMENTATION RATE: Sed Rate: 30 mm/hr — ABNORMAL HIGH (ref 0–16)

## 2017-05-16 LAB — PROTIME-INR
INR: 1.34
Prothrombin Time: 16.4 seconds — ABNORMAL HIGH (ref 11.4–15.2)

## 2017-05-16 LAB — TROPONIN I: Troponin I: 0.04 ng/mL (ref <0.03)

## 2017-05-16 LAB — TSH: TSH: 4.267 u[IU]/mL (ref 0.350–4.500)

## 2017-05-16 LAB — GLUCOSE, RANDOM: GLUCOSE: 416 mg/dL — AB (ref 65–99)

## 2017-05-16 LAB — CK: CK TOTAL: 39 U/L — AB (ref 49–397)

## 2017-05-16 MED ORDER — PREDNISONE 20 MG PO TABS
40.0000 mg | ORAL_TABLET | Freq: Once | ORAL | Status: AC
  Administered 2017-05-16: 40 mg via ORAL
  Filled 2017-05-16: qty 2

## 2017-05-16 MED ORDER — WARFARIN SODIUM 6 MG PO TABS
6.0000 mg | ORAL_TABLET | Freq: Once | ORAL | Status: AC
  Administered 2017-05-16: 6 mg via ORAL
  Filled 2017-05-16: qty 1

## 2017-05-16 MED ORDER — POLYETHYLENE GLYCOL 3350 17 G PO PACK
17.0000 g | PACK | Freq: Every day | ORAL | Status: DC
  Administered 2017-05-16 – 2017-05-21 (×6): 17 g via ORAL
  Filled 2017-05-16 (×6): qty 1

## 2017-05-16 MED ORDER — PREDNISONE 20 MG PO TABS
40.0000 mg | ORAL_TABLET | Freq: Every day | ORAL | Status: DC
  Administered 2017-05-17 – 2017-05-19 (×3): 40 mg via ORAL
  Filled 2017-05-16 (×4): qty 2

## 2017-05-16 NOTE — Discharge Instructions (Addendum)

## 2017-05-16 NOTE — Evaluation (Signed)
Physical Therapy Evaluation Patient Details Name: Christopher [name] MRN: [id number] DOB: February 11, 1927 Today's Date: 05/16/2017   History of Present Illness  Pt is an 84 y/o male admitted secondary to Azotemia. PMH includes CAD s/p CABG, SSS s/p pacemaker, a fib, CHF, COPD on 3L of oxygen, DM, CKD4, HTN, and prostate cancer.   Clinical Impression  Pt admitted secondary to problem above with deficits below. Upon eval, pt very weak in BLE, and reports pain in BLE. Required max A +2 to stand at EOB and only able to tolerate standing for very short period. Feel pt is currently a high fall risk. Will continue to follow acutely to maximize functional mobility independence and safety.     Follow Up Recommendations SNF;Supervision/Assistance - 24 hour    Equipment Recommendations  None recommended by PT    Recommendations for Other Services       Precautions / Restrictions Precautions Precautions: Fall Restrictions Weight Bearing Restrictions: No      Mobility  Bed Mobility Overal bed mobility: Needs Assistance Bed Mobility: Supine to Sit;Sit to Supine     Supine to sit: Max assist Sit to supine: Max assist   General bed mobility comments: Max A for LE assist and trunk elevation to come up to sitting. Increased time required. Also required assist to scoot hips to EOB. Max A to return to supine.   Transfers Overall transfer level: Needs assistance Equipment used: 2 person hand held assist Transfers: Sit to/from Stand Sit to Stand: Max assist;+2 physical assistance         General transfer comment: Max A +2 to stand at EOB. RN in room and assisted with transfer. Only able to tolerate 1-2 seconds in standing secondary to increased LE pain.   Ambulation/Gait             General Gait Details: unable to attempt   Stairs            Wheelchair Mobility    Modified Rankin (Stroke Patients Only)       Balance Overall balance assessment: Needs assistance Sitting-balance  support: Feet supported;Bilateral upper extremity supported Sitting balance-Leahy Scale: Poor Sitting balance - Comments: Reliant on BUE support.    Standing balance support: Bilateral upper extremity supported Standing balance-Leahy Scale: Poor Standing balance comment: Reliant on bUE support and external support to maintain standing.                              Pertinent Vitals/Pain Pain Assessment: 0-10 Pain Score: 8  Pain Location: feet and legs  Pain Descriptors / Indicators: Aching;Operative site guarding Pain Intervention(s): Limited activity within patient's tolerance;Monitored during session;Repositioned    Home Living Family/patient expects to be discharged to:: Private residence Living Arrangements: Spouse/significant other Available Help at Discharge: Family;Available 24 hours/day Type of Home: House Home Access: Level entry     Home Layout: One level Home Equipment: Walker - 2 wheels;Bedside commode;Cane - single point;Wheelchair - manual      Prior Function Level of Independence: Needs assistance   Gait / Transfers Assistance Needed: Has been very weak and has required alot of assist to stand up at home. Has been mostly in his lounge chair or bed. Was able to walk short distances with RW and assist prior to becoming weak  ADL's / Homemaking Assistance Needed: Needed assist with bathing and dressing         Hand Dominance   Dominant Hand: Right  Extremity/Trunk Assessment        Lower Extremity Assessment Lower Extremity Assessment: RLE deficits/detail;LLE deficits/detail;Generalized weakness RLE Deficits / Details: Grossly 3/5 throughout  RLE Sensation: decreased light touch(feet to a little ways above knees ) LLE Deficits / Details: Grossly 3-/5 throughout  LLE Sensation: decreased light touch(feet to a little ways above knees )    Cervical / Trunk Assessment Cervical / Trunk Assessment: Kyphotic  Communication   Communication: No  difficulties  Cognition Arousal/Alertness: Awake/alert Behavior During Therapy: WFL for tasks assessed/performed Overall Cognitive Status: Within Functional Limits for tasks assessed                                        General Comments General comments (skin integrity, edema, etc.): Pt's wife present in room.     Exercises General Exercises - Lower Extremity Ankle Circles/Pumps: AROM;Both;10 reps;Supine Long Arc Quad: AROM;Both;5 reps;Seated Heel Slides: AAROM;Both;5 reps;Supine   Assessment/Plan    PT Assessment Patient needs continued PT services  PT Problem List Decreased strength;Decreased activity tolerance;Decreased balance;Decreased mobility;Decreased knowledge of use of DME;Pain;Impaired sensation       PT Treatment Interventions DME instruction;Gait training;Stair training;Functional mobility training;Therapeutic activities;Therapeutic exercise;Balance training;Neuromuscular re-education;Patient/family education    PT Goals (Current goals can be found in the Care Plan section)  Acute Rehab PT Goals Patient Stated Goal: to get stronger  PT Goal Formulation: With patient Time For Goal Achievement: 05/30/17 Potential to Achieve Goals: Good    Frequency Min 2X/week   Barriers to discharge        Co-evaluation               AM-PAC PT "6 Clicks" Daily Activity  Outcome Measure Difficulty turning over in bed (including adjusting bedclothes, sheets and blankets)?: A Lot Difficulty moving from lying on back to sitting on the side of the bed? : Unable Difficulty sitting down on and standing up from a chair with arms (e.g., wheelchair, bedside commode, etc,.)?: Unable Help needed moving to and from a bed to chair (including a wheelchair)?: Total Help needed walking in hospital room?: Total Help needed climbing 3-5 steps with a railing? : Total 6 Click Score: 7    End of Session Equipment Utilized During Treatment: Gait belt Activity  Tolerance: Patient limited by fatigue;Patient limited by pain Patient left: in bed;with call bell/phone within reach;with family/visitor present;with nursing/sitter in room Nurse Communication: Mobility status PT Visit Diagnosis: Unsteadiness on feet (R26.81);Muscle weakness (generalized) (M62.81);Other abnormalities of gait and mobility (R26.89);Difficulty in walking, not elsewhere classified (R26.2)    Time: 7253-6644 PT Time Calculation (min) (ACUTE ONLY): 23 min   Charges:   PT Evaluation $PT Eval Moderate Complexity: 1 Mod PT Treatments $Therapeutic Activity: 8-22 mins   PT G Codes:        Leighton Ruff, PT, DPT  Acute Rehabilitation Services  Pager: [phone]   Rudean Hitt 05/16/2017, 11:39 AM

## 2017-05-16 NOTE — Care Management Note (Addendum)
Case Management Note  Patient Details  Name: Christopher [name] MRN: [id number] Date of Birth: [birth year]  Subjective/Objective:    History of COPD (oxygen dependent), CKD Stage 4, admitted secondary to Azotemia.  At baseline, patient is mostly sedentary in a chair. He uses a lift chair and cannot stand without assistance. He uses a walker to ambulate.            Action/Plan: Prior to admission patient lived at home with spouse,who is his caregiver.  Home DME: Walker - 2 wheels;Bedside commode;Cane - single point;Wheelchair - manual.  Palliative care was consulted to help address goals at family's request.  Social Worker consulted for SNF placement; CSW "Lorriane Shire" notified of possible SNF placement.  Expected Discharge Date:   To be determined.            Expected Discharge Plan:  Disposition is unclear at the present time  In-House Referral:  Clinical Social Work, Hospice / Palliative Care  Discharge planning Services  CM Consult  Status of Service:  In process, will continue to follow   Kristen Cardinal, BSN, RN Nurse case [place]  05/16/2017, 4:25 PM

## 2017-05-16 NOTE — Progress Notes (Signed)
Notified of CBG 424. Telephone call to oncall. Stat glucose 416. Order received to administer 5 units insulin. Will continue to monitor. Bartholomew Crews, RN

## 2017-05-16 NOTE — Consult Note (Signed)
Consultation Note Date: 05/16/2017   Patient Name: Christopher [name]  DOB: 06/15/1926  MRN: [id number]  Age / Sex: 84 y.o., male  PCP: Prince Solian, MD Referring Physician: Lind Covert, MD  Reason for Consultation: Establishing goals of care  HPI/Patient Profile: 84 y.o. male  with past medical history of CAD s/p CABG, SSS s/p PPM, afib on coumadin, diastolic dysfunction with h/o CHF, O2 dependent COPD (on 3L), HTN, HLD, CKD IV, DM, h/o prostate cancer, and admitted on 05/15/2017 with weakness and azotemia after one week of poor oral intake. Etiology of decline is unclear. Workup is ongoing. Palliative care was consulted to help address goals at family's request.   Clinical Assessment and Goals of Care: I met with patient and his wife. He lives at home with wife, who is his caregiver. At baseline, patient is mostly sedentary in a chair. He uses a lift chair and cannot stand without assistance. He uses a walker to ambulate and has exertional dyspnea at baseline. Patient began experiencing increasing weakness and general pain over past week. He lost the ability to stand and ambulate. Both patient and wife are interested in workup and an opportunity for him to return to his previous functional baseline if possible. Otherwise, his wife would be unable to care for him at home without assistance. Disposition is unclear at the present time.   Patient says that he is followed by nephrology. He had several attempts made for fistula placement in anticipation of requiring hemodialysis. However, pt says he has decided not to pursue dialysis.   Pt confirms that he is a DNR. He has a living will at home and would want his wife to be his decision maker if needed. He would also want the involvement of his children in healthcare decisions if needed.   SUMMARY OF RECOMMENDATIONS   1. Will follow.  2. Agree with DNR 3.  Recommend PT eval 4. Would likely be appropriate for hospice or palliative care at home although this has not yet been discussed with pt/family  Prognosis:   Unable to determine  Discharge Planning: To Be Determined      Primary Diagnoses: Present on Admission: . Azotemia   I have reviewed the medical record, interviewed the patient and family, and examined the patient. The following aspects are pertinent.  Past Medical History:  Diagnosis Date  . Anemia   . Atrial fibrillation ([place]) 08/07/2011   CHADS score  2 CHADS2 VASC score 4  Onset winter of 2013 Cardioversion May 2013 Reversion to a fib 3/14 amiodarone begun 07/02/12  Repeat cardioversion Jul 16, 2012    . BPH (benign prostatic hyperplasia)   . CAD (coronary artery disease) 03/06/2010   CABG with LIMA to LAD, SVG to OM-OM2  05/05/01 Dr. Roxan Hockey  Cardiac Cath 12/2009 60% left main, occluded LAD, Widely patent SVG to OM1 and 2, patent LIMA, widely patent RCA    . Cardiac pacemaker in situ    07/26/10  Inserted for chronotropic incompetence and dyspnea  RA lead  Medtronic [id number]  serial number [id number]   RV lead  Medtronic [id number]  serial number [id number] Medtronic Adapta RL pulse generator, SN  [id number]    . Chronic diastolic congestive heart failure, NYHA class 2 ([place]) 06/10/2016  . Chronic kidney disease (CKD), stage IV (severe) ([facility])    Dr. Mercy Moore  . Diabetes mellitus without complication ([place])    Type II  . History of skin cancer   . Hypertensive heart disease without CHF   . Lumbar disc disease 1992  . Prostate cancer ([place])   . Sinus node dysfunction ([facility])    symptomatic bradycardia   Social History   Socioeconomic History  . Marital status: Married    Spouse name: None  . Number of children: 3  . Years of education: None  . Highest education level: None  Social Needs  . Financial resource strain: None  . Food insecurity - worry: None  . Food insecurity - inability: None  . Transportation needs - medical:  None  . Transportation needs - non-medical: None  Occupational History  . Occupation: retired  Tobacco Use  . Smoking status: Former Smoker    Packs/day: 0.75    Years: 24.00    Pack years: 18.00    Types: Cigarettes    Last attempt to quit: 03/12/1968    Years since quitting: 49.2  . Smokeless tobacco: Former Systems developer    Types: Snuff, Sarina Ser    Quit date: 03/11/1968  Substance and Sexual Activity  . Alcohol use: No    Alcohol/week: 0.0 oz  . Drug use: No  . Sexual activity: Not Currently  Other Topics Concern  . None  Social History Narrative  . None   Family History  Problem Relation Age of Onset  . Cancer Mother        breast  . Diabetes Mother   . Cancer Father        prostate, esophagus   Scheduled Meds: . darbepoetin (ARANESP) injection - NON-DIALYSIS  100 mcg Subcutaneous Q Fri-1800  . insulin aspart  0-5 Units Subcutaneous QHS  . insulin aspart  0-9 Units Subcutaneous TID WC  . metoprolol succinate  25 mg Oral Daily  . warfarin  6 mg Oral ONCE-1800  . Warfarin - Pharmacist Dosing Inpatient   Does not apply q1800   Continuous Infusions: . sodium chloride 75 mL/hr at 05/15/17 2337   PRN Meds:.acetaminophen **OR** acetaminophen, HYDROcodone-acetaminophen, ondansetron **OR** ondansetron (ZOFRAN) IV, polyethylene glycol Medications Prior to Admission:  Prior to Admission medications   Medication Sig Start Date End Date Taking? Authorizing Provider  carboxymethylcellulose (REFRESH PLUS) 0.5 % SOLN Place 1 drop into both eyes 3 (three) times daily as needed (for dry eyes.).   Yes [provider]  Darbepoetin Alfa (ARANESP) 100 MCG/0.5ML SOSY injection Inject 100 mcg into the skin every 21 ( twenty-one) days.   Yes [provider]  folic acid (FOLVITE) 638 MCG tablet Take 400 mcg by mouth daily.     Yes [provider]  furosemide (LASIX) 80 MG tablet Take 1-2 tablets (80-160 mg total) by mouth 2 (two) times daily. Take 160 mg in the morning and 80  mg in the evening. Patient taking differently: Take 160 mg by mouth 2 (two) times daily.  06/13/16  Yes Rama, Venetia Maxon, MD  glimepiride (AMARYL) 1 MG tablet Take 1 tablet (1 mg total) by mouth daily before breakfast. Patient taking differently: Take 0.5 mg by mouth daily before breakfast.  07/13/14  Yes Love, Olin Hauser  S, PA-C  [place] 300 MG CAPS Take 300 mg by mouth daily.   Yes [provider]  HYDROcodone-acetaminophen (NORCO/VICODIN) 5-325 MG tablet Take 1 tablet by mouth every 6 (six) hours as needed. For pain. Patient taking differently: Take 1 tablet by mouth every 6 (six) hours as needed (for pain).  05/14/16  Yes Alvia Grove, PA-C  metolazone (ZAROXOLYN) 2.5 MG tablet Take 2.5 mg by mouth See admin instructions. "2.5 mg by mouth once a day as needed/based on weight" 04/02/17  Yes [provider]  metoprolol succinate (TOPROL-XL) 25 MG 24 hr tablet Take 25 mg by mouth daily. 11/30/15  Yes [provider]  Multiple Vitamin (MULTIVITAMIN WITH MINERALS) TABS tablet Take 1 tablet by mouth daily.   Yes [provider]  nitroGLYCERIN (NITROSTAT) 0.4 MG SL tablet Place 0.4 mg under the tongue every 5 (five) minutes as needed for chest pain. 10/26/15  Yes [provider]  Omega-3 Fatty Acids (FISH OIL) 1200 MG CAPS Take 1,200 mg by mouth 2 (two) times daily.    Yes [provider]  OXYGEN Inhale 3 L into the lungs continuous.   Yes [provider]  Polyethylene Glycol 3350 (MIRALAX PO) Take 17 g by mouth daily.    Yes [provider]  ULORIC 40 MG tablet Take 40 mg by mouth daily. 04/14/17  Yes [provider]  warfarin (COUMADIN) 4 MG tablet Take 4 mg by mouth in the evening on Sun/Mon/Tues/Wed/Thurs/Sat and 6 mg on Fri 03/24/17  Yes [provider]  warfarin (COUMADIN) 3 MG tablet Take 1 tablet (3 mg total) by mouth daily. Patient not taking: Reported on 05/15/2017 05/15/16   Virgina Jock A, PA-C    Allergies  Allergen Reactions  . Crestor [Rosuvastatin] Other (See Comments)    "ACHING JOINTS"  . Lipitor [Atorvastatin] Other (See Comments)    "ACHING JOINTS"  . Statins Other (See Comments)    "ACHING JOINTS"  . Penicillins Rash and Other (See Comments)    Has patient had a PCN reaction causing immediate rash, facial/tongue/throat swelling, SOB or lightheadedness with hypotension: Yes Has patient had a PCN reaction causing severe rash involving mucus membranes or skin necrosis: No Has patient had a PCN reaction that required hospitalization: No Has patient had a PCN reaction occurring within the last 10 years: No If all of the above answers are "NO", then may proceed with Cephalosporin use.   . Tramadol Nausea Only   Review of Systems  Constitutional: Positive for activity change.  Respiratory: Positive for shortness of breath.   Musculoskeletal: Positive for myalgias.  Neurological: Positive for weakness.    Physical Exam  Constitutional: He appears well-developed.  Frail appearing, NAD  HENT:  Head: Normocephalic and atraumatic.  Eyes: EOM are normal. Pupils are equal, round, and reactive to light.  Neck: Normal range of motion. Neck supple.  Cardiovascular: Normal rate.    Vital Signs: BP (!) 164/52 (BP Location: Right Arm)   Pulse 73   Temp 98.8 F (37.1 C) (Oral)   Resp 19   Ht 6' 1"  (1.854 m)   Wt 104.2 kg (229 lb 11.5 oz)   SpO2 100%   BMI 30.31 kg/m  Pain Assessment: No/denies pain   Pain Score: Asleep   SpO2: SpO2: 100 % O2 Device:SpO2: 100 % O2 Flow Rate: .O2 Flow Rate (L/min): 2 L/min  IO: Intake/output summary:   Intake/Output Summary (Last 24 hours) at 05/16/2017 0836 Last data filed at 05/16/2017 [phone]  Gross per 24 hour  Intake 1317.5 ml  Output 1100 ml  Net 217.5 ml    LBM:   Baseline Weight: Weight: 104.3 kg (230 lb) Most recent weight: Weight: 104.2 kg (229 lb 11.5 oz)     Palliative Assessment/Data:     Time In: 0730 Time  Out: 0800 Time Total: 30  minutes Greater than 50%  of this time was spent counseling and coordinating care related to the above assessment and plan.  Signed by: Irean Hong, NP   Please contact Palliative Medicine Team phone at [phone] for questions and concerns.  For individual provider: See Shea Evans

## 2017-05-16 NOTE — Evaluation (Signed)
Occupational Therapy Evaluation Patient Details Name: Christopher [name] MRN: [id number] DOB: [birth year] Today's Date: 05/16/2017    History of Present Illness Pt is an 84 y/o male admitted secondary to Azotemia. PMH includes CAD s/p CABG, SSS s/p pacemaker, a fib, CHF, COPD on 3L of oxygen, DM, CKD4, HTN, and prostate cancer.    Clinical Impression   PT admitted with azotemia. Pt currently with functional limitiations due to the deficits listed below (see OT problem list). Pt currently requires total+2 mod (A) for sit<>stand. Pt requires (A) with adls at baseline but not for bed mobility or transfer to chair. Pt currently requires (A) for all aspects of care.  Pt will benefit from skilled OT to increase their independence and safety with adls and balance to allow discharge CIR. Pt is requesting to remain at [facility] for therapy to progress home. Pt and wife educated on the need to complete 3 hours of therapy. Pt most appropriate for palliative medicine recommendation for care and SNF level .     Follow Up Recommendations  SNF  (pt requesting CIR at this time)   Equipment Recommendations  Hospital bed;Wheelchair cushion (measurements OT);Wheelchair (measurements OT);3 in 1 bedside commode    Recommendations for Other Services       Precautions / Restrictions Precautions Precautions: Fall      Mobility Bed Mobility Overal bed mobility: Needs Assistance Bed Mobility: Supine to Sit;Sit to Supine     Supine to sit: Mod assist Sit to supine: Min assist   General bed mobility comments: pt exiting on the R side with bed rail use. pt with mod cueing but completed task  Transfers Overall transfer level: Needs assistance Equipment used: 2 person hand held assist Transfers: Sit to/from Stand Sit to Stand: +2 physical assistance;Mod assist         General transfer comment: pt side step to [facility] total +2 min (A) with max (A) for RW    Balance Overall balance assessment: Needs  assistance Sitting-balance support: Bilateral upper extremity supported;Feet supported Sitting balance-Leahy Scale: Fair     Standing balance support: Bilateral upper extremity supported Standing balance-Leahy Scale: Poor Standing balance comment: relies on BIL UE and fatigues quickly                           ADL either performed or assessed with clinical judgement   ADL Overall ADL's : Needs assistance/impaired     Grooming: Brushing hair;Minimal assistance;Sitting Grooming Details (indicate cue type and reason): able to complete hair grooming with BIL UE with appropriate ROM for task                               General ADL Comments: pt completed bed mobilty and sit<>Stand side step R to HOB . pt fatigues quickly     Vision Baseline Vision/History: Wears glasses Wears Glasses: At all times       Perception     Praxis      Pertinent Vitals/Pain Pain Assessment: No/denies pain     Hand Dominance Right   Extremity/Trunk Assessment Upper Extremity Assessment Upper Extremity Assessment: RUE deficits/detail RUE Deficits / Details: reports feeling "stiff" pt with pain at the elbow with elbow flexion once but did not occur with repeat AAROM. pt able to self range elbow flexion full and shoulder flexion ~70 degrees. pt reports that R UE AROM varying throughout the day  Lower Extremity Assessment Lower Extremity Assessment: Defer to PT evaluation   Cervical / Trunk Assessment Cervical / Trunk Assessment: Kyphotic   Communication Communication Communication: No difficulties   Cognition Arousal/Alertness: Awake/alert Behavior During Therapy: WFL for tasks assessed/performed Overall Cognitive Status: Within Functional Limits for tasks assessed                                     General Comments       Exercises General Exercises - Lower Extremity Ankle Circles/Pumps: AROM;Both;10 reps;Supine   Shoulder Instructions       Home Living Family/patient expects to be discharged to:: Private residence Living Arrangements: Spouse/significant other Available Help at Discharge: Family;Available 24 hours/day Type of Home: House Home Access: Level entry     Home Layout: One level     Bathroom Shower/Tub: Teacher, early years/pre: Standard     Home Equipment: Environmental consultant - 2 wheels;Bedside commode;Cane - single point;Wheelchair - manual   Additional Comments: son and wife present on eval. Pt and wife married 20 yrs after losing their first spouses both to cancer. they met via piano and church      Prior Functioning/Environment Level of Independence: Needs assistance  Gait / Transfers Assistance Needed: Has been very weak and has required alot of assist to stand up at home. Has been mostly in his lounge chair or bed. Was able to walk short distances with RW and assist prior to becoming weak ADL's / Homemaking Assistance Needed: Needed assist with bathing and dressing             OT Problem List: Decreased strength;Decreased activity tolerance;Decreased range of motion;Impaired balance (sitting and/or standing);Decreased coordination;Decreased cognition;Decreased safety awareness;Decreased knowledge of use of DME or AE;Decreased knowledge of precautions;Obesity;Impaired UE functional use;Pain      OT Treatment/Interventions: Self-care/ADL training;Therapeutic exercise;Energy conservation;Neuromuscular education;DME and/or AE instruction;Manual therapy;Therapeutic activities;Patient/family education;Balance training    OT Goals(Current goals can be found in the care plan section) Acute Rehab OT Goals Patient Stated Goal: to return home  OT Goal Formulation: With patient/family Time For Goal Achievement: 05/30/17 Potential to Achieve Goals: Good  OT Frequency: Min 2X/week   Barriers to D/C: Decreased caregiver support          Co-evaluation              AM-PAC PT "6 Clicks" Daily  Activity     Outcome Measure Help from another person eating meals?: A Little Help from another person taking care of personal grooming?: A Lot Help from another person toileting, which includes using toliet, bedpan, or urinal?: Total Help from another person bathing (including washing, rinsing, drying)?: Total Help from another person to put on and taking off regular upper body clothing?: Total Help from another person to put on and taking off regular lower body clothing?: Total 6 Click Score: 9   End of Session Equipment Utilized During Treatment: Gait belt Nurse Communication: Mobility status;Precautions  Activity Tolerance: Patient tolerated treatment well Patient left: in bed;with call bell/phone within reach;with family/visitor present  OT Visit Diagnosis: Unsteadiness on feet (R26.81);Muscle weakness (generalized) (M62.81)                Time: 1410-1435 OT Time Calculation (min): 25 min Charges:  OT General Charges $OT Visit: 1 Visit OT Evaluation $OT Eval Moderate Complexity: 1 Mod G-Codes:      Jeri Modena   OTR/L Pager: [phone] Office:  [phone] .   Parke Poisson B 05/16/2017, 3:51 PM

## 2017-05-16 NOTE — Progress Notes (Addendum)
Family Medicine Teaching Service Daily Progress Note Intern Pager: [phone]  Patient name: Christopher [name] Medical record number: [id number] Date of birth: 06-16-1926 Age: 84 y.o. Gender: male  Primary Care Provider: Prince Solian, MD Consultants: Palliative Code Status: DNR  Pt Overview and Major Events to Date:  3/07: Admitted   Assessment and Plan: Christopher [name] is a 84 y.o. male presenting with generalized weakness. PMH is significant for CAD s/p CABG x 3 in 2003, pacemaker in place, pA-fib on Coumadin, HTN, HFpEF, HLD, CKD V, hx prostate cancer  Generalized Weakness: Likely due to combination of dehydration (from poor PO intake x 1 week and elevated blood sugars causing an osmotic diuresis), azotemia (BUN 106>99), and deconditioning.  - Will trend troponins and repeat AM EKG - TSH and CK 36 ruled out other causes  - Gentle hydration overnight - PT/OT to evaluate - Palliative care consulted per family's request  Azotemia in CKD IV: Follows with Dr. Moshe Cipro. Does not want to pursue HD. Takes Lasix 160mg  in the morning and 80mg  in the evening, as well as Metolazine 2.5mg  daily as needed. BUN 106>99, Cr 2.93 (BL ~3). - Nephrology consulted, appreciate recommendations - s/p NS @ 32ml/hr for 12 hours - Hold Lasix and Metolazone for now   CAD s/p CABG: in 2003. No chest pain currently - Continue home Metoprolol succinate 25mg  daily  Paroxysmal A-fib: In A-fib but rate controlled with HRs in the 70s. On Coumadin at home. CHADSVASc is a 5. - Coumadin per pharm - Check PT/INR - Continue home Metoprolol succinate 25mg  daily  HTN: Normotensive in the ED. - Continue home Metoprolol succinate 25mg  daily  T2DM: On Amaryl 0.5mg  at home. Last A1c per our records is 6.1%. Glucose on admission was 376. - Hold Amaryl while hospitalized and would recommend not restarting on discharge due to patient's age - CBGs with meals and at bedtime - Sensitive SSI - A1c 8.6  HFpEF: Last  ECHO in 2015 with EF 55-60%, mild LVH, indeterminate diastolic dysfunction, moderately dilated left atrium - Holding Lasix and Metolazone tonight and will re-assess in the morning  HLD: Not on a statin due to intolerance. Last lipid panel 06/2014 with Chol 218, HDL 39, LDL 142, TG 186 - Will not recheck lipid panel because do not think it will change management  Anemia of Chronic Disease: On Aranesp. Hgb 9.0 in the ED (BL 9-10). - Per nephro - Daily CBCs  Hx Prostate Cancer: stable. Cannot see any records about this.  - Monitor  Chronic Pain: due to osteoarthritis - Continue home Norco 5-325mg  q6hrs prn  FEN/GI: Heart healthy carb-modified Prophylaxis: Coumadin  Disposition: inpatient level of care  Subjective:  Patient reports he still feels very tired. Reports doctor just came in and he is exhauster from trying to sit up. He reports that he feels slightly better than on admission but he is just tired all the time. Slept well last night.   Objective: Temp:  [97.8 F (36.6 C)-98.8 F (37.1 C)] 98.8 F (37.1 C) (03/08 0617) Pulse Rate:  [71-82] 73 (03/08 0617) Resp:  [16-24] 19 (03/08 0617) BP: (119-185)/(52-84) 164/52 (03/08 0617) SpO2:  [97 %-100 %] 100 % (03/08 0617) Weight:  [229 lb 11.5 oz (104.2 kg)-230 lb (104.3 kg)] 229 lb 11.5 oz (104.2 kg) (03/07 2050) Physical Exam: General: NAD, pleasant, laying in bed Eyes: PERRL, EOMI, no conjunctival pallor or injection ENTM: Moist mucous membranes Cardiovascular: RRR, no m/r/g Respiratory: CTA BL, normal work of breathing Gastrointestinal:  soft, nontender, mildly distended, normoactive BS MSK: moves 4 extremities equally Derm: no rashes appreciated Psych: AOx3, appropriate affect  Laboratory: Recent Labs  Lab 05/15/17 1346 05/16/17 0553  WBC 6.7 4.8  HGB 9.0* 9.0*  HCT 29.6* 30.0*  PLT 353 356   Recent Labs  Lab 05/15/17 1346 05/16/17 0553  NA 136 137  K 4.7 4.8  CL 95* 97*  CO2 27 28  BUN 106* 99*   CREATININE 2.93* 2.68*  CALCIUM 8.9 9.0  PROT 6.6 6.1*  BILITOT 1.1 1.1  ALKPHOS 58 49  ALT 30 28  AST 43* 38  GLUCOSE 367* 216*   A1c 8.6  Imaging/Diagnostic Tests: Dg Chest 2 View  Result Date: 05/15/2017 CLINICAL DATA:  Patient experiencing SOB for 2-3 months. Increasing weakness and aches for 1 year. Chest pain that comes and goes. HTN takes medication. Diabetic takes medication. Hx of A-fib, CAD, cardiac pacemaker, CHF. EXAM: CHEST - 2 VIEW COMPARISON:  06/10/2016 FINDINGS: Status post median sternotomy and CABG. Patient's LEFT-sided transvenous pacemaker leads to the RIGHT atrium and RIGHT ventricle. Shallow lung inflation. The heart is enlarged. There is mild streaky atelectasis at the lung bases. No consolidations or evidence for pulmonary edema. IMPRESSION: Cardiomegaly.  No evidence for acute  abnormality. Electronically Signed   By: Nolon Nations M.D.   On: 05/15/2017 14:55    Garima Chronis, [place], DO 05/16/2017, 9:42 AM PGY-1, [place] Intern pager: [phone], text pages welcome

## 2017-05-16 NOTE — Progress Notes (Signed)
[facility] Rounding Note  Subjective/Interval History:  Says "breathing is good on the inflow but catches on the outtake" (good way to describe wheezing) Also "every part of me hurts every way I turn" Lying very still in bed Says feels no better at all  Objective Vital signs in last 24 hours: Vitals:   05/15/17 2000 05/15/17 2050 05/16/17 0617 05/16/17 0951  BP: 130/83 (!) 185/63 (!) 164/52 (!) 107/53  Pulse: 71 82 73 72  Resp: 17 18 19 18   Temp:  98.5 F (36.9 C) 98.8 F (37.1 C) 98.6 F (37 C)  TempSrc:  Oral Oral Oral  SpO2: 100% 100% 100% 99%  Weight:  104.2 kg (229 lb 11.5 oz)    Height:  6\' 1"  (1.854 m)     Weight change:   Intake/Output Summary (Last 24 hours) at 05/16/2017 1553 Last data filed at 05/16/2017 1358 Gross per 24 hour  Intake 2196.25 ml  Output 1100 ml  Net 1096.25 ml   Physical Exam:  Blood pressure (!) 107/53, pulse 72, temperature 98.6 F (37 C), temperature source Oral, resp. rate 18, height 6\' 1"  (1.854 m), weight 104.2 kg (229 lb 11.5 oz), SpO2 99 %.  Pale very nice older WM Lying very still VS as noted Still getting IVF's Bilat soft exp wheezing S1S2 No S3 Abd soft Trace LE edema  Recent Labs  Lab 05/15/17 1346 05/16/17 0553  NA 136 137  K 4.7 4.8  CL 95* 97*  CO2 27 28  GLUCOSE 367* 216*  BUN 106* 99*  CREATININE 2.93* 2.68*  CALCIUM 8.9 9.0    Recent Labs  Lab 05/15/17 1346 05/16/17 0553  AST 43* 38  ALT 30 28  ALKPHOS 58 49  BILITOT 1.1 1.1  PROT 6.6 6.1*  ALBUMIN 2.9* 3.0*    Recent Labs  Lab 05/15/17 1346 05/16/17 0553  WBC 6.7 4.8  HGB 9.0* 9.0*  HCT 29.6* 30.0*  MCV 102.1* 102.7*  PLT 353 356    Recent Labs  Lab 05/15/17 1346 05/15/17 2107 05/16/17 0553  CKTOTAL  --   --  39*  TROPONINI 0.04* 0.03* 0.04*   CBG: Recent Labs  Lab 05/15/17 2033 05/16/17 0816 05/16/17 1150  GLUCAP 357* 232* 250*    Dg Chest 2 View  Result Date: 05/15/2017 CLINICAL DATA:  Patient experiencing SOB for 2-3 months.  Increasing weakness and aches for 1 year. Chest pain that comes and goes. HTN takes medication. Diabetic takes medication. Hx of A-fib, CAD, cardiac pacemaker, CHF. EXAM: CHEST - 2 VIEW COMPARISON:  06/10/2016 FINDINGS: Status post median sternotomy and CABG. Patient's LEFT-sided transvenous pacemaker leads to the RIGHT atrium and RIGHT ventricle. Shallow lung inflation. The heart is enlarged. There is mild streaky atelectasis at the lung bases. No consolidations or evidence for pulmonary edema. IMPRESSION: Cardiomegaly.  No evidence for acute  abnormality. Electronically Signed   By: Nolon Nations M.D.   On: 05/15/2017 14:55   Medications: . sodium chloride 75 mL/hr at 05/16/17 0859   . darbepoetin (ARANESP) injection - NON-DIALYSIS  100 mcg Subcutaneous Q Fri-1800  . insulin aspart  0-5 Units Subcutaneous QHS  . insulin aspart  0-9 Units Subcutaneous TID WC  . metoprolol succinate  25 mg Oral Daily  . polyethylene glycol  17 g Oral Daily  . [START ON 05/17/2017] predniSONE  40 mg Oral Q breakfast  . warfarin  6 mg Oral ONCE-1800  . Warfarin - Pharmacist Dosing Inpatient   Does not apply [id number]  Background: 70M PMH HTN, HLD, CAD s/p CABG, Afib, anemia, and CKD V seen in consultation at the request of Dr. Erin Hearing for eval and recs re: progressive CKD. Presented w/progresive weakness to point unable to get out of bed. At his last visit with Dr. Moshe Cipro ~2 months ago expressed that he would never want to do dialysis. Of note, he initially planning to do dialysis, had three accesses placed, all of which failed, then decided against.  Last Cr 2.8 and BUN 88 at our office 10/2016.  Assessment/Plan 1. Progressive CKD:  Adm BUN 106/Cr 2.9, some progression past months. Very clear that he doesn't want to do dialysis. Poor po, taking his diuretics at home. Currently holding lasix and metolazone.  Rec'd VF overnight as temporizing measure w/some drop in the numbers but now with some wheezing. Will  stop fluids, possibly resume diuretics in the AM. (his CKD is such that he will need his lasix restarted sometime during this admission probably [place] rather than later). 2. Anemia:  Getting aranesp 100 mcg q 4 weeks- last dose 04/18/2017. Ordered for today   3. Chronic dCHF: Admission CXR without any evidence of vol overload; now with some exp wheezing with IVF's so will D/C.      4. Weakness: Suspect uremia main culprit; has frailty and debility and compounding the issue is quite deconditioned. OT/PT evaluating. 5. Disposition - Palliative Care has seen. Pt is DNR. PT feels needs SNF.   Jamal Maes, MD [place] Pager 05/16/2017, 4:00 PM

## 2017-05-16 NOTE — Progress Notes (Signed)
ANTICOAGULATION CONSULT NOTE - Initial Consult  Pharmacy Consult for Coumadin Indication: h/o atrial fibrillation  Allergies  Allergen Reactions  . Crestor [Rosuvastatin] Other (See Comments)    "ACHING JOINTS"  . Lipitor [Atorvastatin] Other (See Comments)    "ACHING JOINTS"  . Statins Other (See Comments)    "ACHING JOINTS"  . Penicillins Rash and Other (See Comments)    Has patient had a PCN reaction causing immediate rash, facial/tongue/throat swelling, SOB or lightheadedness with hypotension: Yes Has patient had a PCN reaction causing severe rash involving mucus membranes or skin necrosis: No Has patient had a PCN reaction that required hospitalization: No Has patient had a PCN reaction occurring within the last 10 years: No If all of the above answers are "NO", then may proceed with Cephalosporin use.   . Tramadol Nausea Only   Patient Measurements: Height: 6\' 1"  (185.4 cm) Weight: 229 lb 11.5 oz (104.2 kg) IBW/kg (Calculated) : 79.9 Heparin Dosing Weight: 101 kg  Assessment: 84 y.o male presented to [facility] ED on 05/15/17 with generalized weakness x 2 mos.  H/o Afib, on Coumadin 4mg  daily exc 6mg  on Fri PTA. INR on admit low at 1.37. No missed doses noted. INR today is still low at 1.34. Hgb 9, plts wnl.  Goal of Therapy:  INR 2-3 Monitor platelets by anticoagulation protocol: Yes   Plan:  Give Coumadin 6 mg  x 1 Monitor daily INR, CBC, s/s of bleed  Elenor Quinones, PharmD, [facility] Clinical Pharmacist Pager [phone] 05/16/2017 8:33 AM

## 2017-05-17 DIAGNOSIS — N185 Chronic kidney disease, stage 5: Secondary | ICD-10-CM

## 2017-05-17 DIAGNOSIS — N179 Acute kidney failure, unspecified: Principal | ICD-10-CM

## 2017-05-17 LAB — RENAL FUNCTION PANEL
ALBUMIN: 3 g/dL — AB (ref 3.5–5.0)
Anion gap: 13 (ref 5–15)
BUN: 92 mg/dL — AB (ref 6–20)
CO2: 26 mmol/L (ref 22–32)
CREATININE: 2.5 mg/dL — AB (ref 0.61–1.24)
Calcium: 9.1 mg/dL (ref 8.9–10.3)
Chloride: 96 mmol/L — ABNORMAL LOW (ref 101–111)
GFR calc Af Amer: 25 mL/min — ABNORMAL LOW (ref >60)
GFR, EST NON AFRICAN AMERICAN: 21 mL/min — AB (ref >60)
GLUCOSE: 293 mg/dL — AB (ref 65–99)
PHOSPHORUS: 4.7 mg/dL — AB (ref 2.5–4.6)
Potassium: 5.3 mmol/L — ABNORMAL HIGH (ref 3.5–5.1)
Sodium: 135 mmol/L (ref 135–145)

## 2017-05-17 LAB — GLUCOSE, CAPILLARY
GLUCOSE-CAPILLARY: 328 mg/dL — AB (ref 65–99)
Glucose-Capillary: 263 mg/dL — ABNORMAL HIGH (ref 65–99)
Glucose-Capillary: 268 mg/dL — ABNORMAL HIGH (ref 65–99)
Glucose-Capillary: 278 mg/dL — ABNORMAL HIGH (ref 65–99)
Glucose-Capillary: 324 mg/dL — ABNORMAL HIGH (ref 65–99)
Glucose-Capillary: 422 mg/dL — ABNORMAL HIGH (ref 65–99)

## 2017-05-17 LAB — PROTIME-INR
INR: 1.42
PROTHROMBIN TIME: 17.2 s — AB (ref 11.4–15.2)

## 2017-05-17 MED ORDER — INSULIN GLARGINE 100 UNIT/ML ~~LOC~~ SOLN
2.0000 [IU] | Freq: Every day | SUBCUTANEOUS | Status: DC
  Administered 2017-05-17: 2 [IU] via SUBCUTANEOUS
  Filled 2017-05-17 (×2): qty 0.02

## 2017-05-17 MED ORDER — WARFARIN SODIUM 6 MG PO TABS
6.0000 mg | ORAL_TABLET | Freq: Once | ORAL | Status: AC
  Administered 2017-05-17: 6 mg via ORAL
  Filled 2017-05-17: qty 1

## 2017-05-17 MED ORDER — FUROSEMIDE 80 MG PO TABS
80.0000 mg | ORAL_TABLET | Freq: Two times a day (BID) | ORAL | Status: DC
  Administered 2017-05-17 – 2017-05-21 (×9): 80 mg via ORAL
  Filled 2017-05-17 (×9): qty 1

## 2017-05-17 NOTE — Progress Notes (Signed)
Daily Progress Note   Patient Name: Christopher [name]       Date: 05/17/2017 DOB: 15-Feb-1927  Age: 84 y.o. MRN#: [id number] Attending Physician: Lind Covert, MD Primary Care Physician: Prince Solian, MD Admit Date: 05/15/2017  Reason for Consultation/Follow-up: Establishing goals of care  Subjective: Remains weak. Having pins/needles to legs. OT consult noted.   Length of Stay: 2  Current Medications: Scheduled Meds:  . darbepoetin (ARANESP) injection - NON-DIALYSIS  100 mcg Subcutaneous Q Fri-1800  . furosemide  80 mg Oral BID  . insulin aspart  0-5 Units Subcutaneous QHS  . insulin aspart  0-9 Units Subcutaneous TID WC  . insulin glargine  2 Units Subcutaneous QHS  . metoprolol succinate  25 mg Oral Daily  . polyethylene glycol  17 g Oral Daily  . predniSONE  40 mg Oral Q breakfast  . warfarin  6 mg Oral ONCE-1800  . Warfarin - Pharmacist Dosing Inpatient   Does not apply q1800    Continuous Infusions:   PRN Meds: acetaminophen **OR** acetaminophen, HYDROcodone-acetaminophen, ondansetron **OR** ondansetron (ZOFRAN) IV  Physical Exam          Vital Signs: BP (!) 116/55 (BP Location: Left Arm)   Pulse 74   Temp 98.6 F (37 C) (Oral)   Resp 18   Ht 6' 1"  (1.854 m)   Wt 104.2 kg (229 lb 11.5 oz)   SpO2 93%   BMI 30.31 kg/m  SpO2: SpO2: 93 % O2 Device: O2 Device: Nasal Cannula O2 Flow Rate: O2 Flow Rate (L/min): 3 L/min      Patient Active Problem List   Diagnosis Date Noted  . Uremia   . Acute renal failure superimposed on stage 5 chronic kidney disease, not on chronic dialysis ([place])   . Palliative care encounter   . Azotemia 05/15/2017  . CHF (congestive heart failure) ([place]) 06/11/2016  . Chronic diastolic congestive heart failure, NYHA class 2 ([place])  06/10/2016  . Acute pulmonary edema ([place]) 05/17/2016  . Obesity (BMI 30-39.9) 05/17/2016  . Anemia of chronic disease 10/05/2015  . Hypoxia 07/20/2015  . Acute on chronic diastolic CHF (congestive heart failure), NYHA class 2 ([place]) 07/24/2013  . DM type 2 causing renal disease ([place]) 07/24/2013  . Pulmonary nodule with restrictive lung disease 07/24/2013  . Malignant neoplasm of prostate (  [place]) 07/16/2013  . History of amiodarone therapy 07/16/2012  . Paroxysmal atrial fibrillation ([place]) 08/07/2011  . Lumbar disc disease   . Hyperlipidemia   . Hypertensive heart disease without CHF   . CKD (chronic kidney disease), stage V ([place])   . Long-term (current) use of anticoagulants   . BPH (benign prostatic hyperplasia)   . Cardiac pacemaker in situ   . CAD (coronary artery disease) 03/06/2010    Palliative Care Assessment & Plan   Assessment: Workup unrevealing with etiology for weakness and deconditioning likely multifactorial from dehydration and A/CKD. I met with patient and wife again today. Wife would not be able to care for him at home in his present condition. Would recommend rehab with palliative care to follow at the facility. I discussed future option of hospice and both patient and wife were receptive to this. I would recommend hospice follow him at home after rehab.   Note that patient reports burning pain in both feet in addition to weakness. He says that he thinks he has been treated for neuropathy in the past with gabapentin and Cymbalta. Both would likely be contraindicated now due to CKD. He takes Norco for pain, but as expected, this is not efficacious with neuropathic pain. Might consider trial of topical capsaicin or lidocaine.   Could not locate previous B12 result. Would recommend checking serum B12 and RBC folate.    Recommendations/Plan:  Likely will need rehab.   Consider outpatient palliative care  Future use of hospice  Recommend checking serum B12 and RBC  folate  Care plan was discussed with patient and family  Thank you for allowing the Palliative Medicine Team to assist in the care of this patient.   Time In: 1500 Time Out: 1530 Total Time 30 minutes Prolonged Time Billed  NO      Greater than 50%  of this time was spent counseling and coordinating care related to the above assessment and plan.  Irean Hong, NP  Please contact Palliative Medicine Team phone at [phone] for questions and concerns.

## 2017-05-17 NOTE — Clinical Social Work Note (Signed)
Clinical Social Work Assessment  Patient Details  Name: Christopher [name] MRN: [id number] Date of Birth: 06-02-1926  Date of referral:  05/17/17               Reason for consult:  Facility Placement                Permission sought to share information with:  Family Supports Permission granted to share information::  Yes, Release of Information Signed  Name::     Teacher, early years/pre::     Relationship::  Spouse  Contact Information:     Housing/Transportation Living arrangements for the past 2 months:  Single Family Home Source of Information:  Patient Patient Interpreter Needed:  None Criminal Activity/Legal Involvement Pertinent to Current Situation/Hospitalization:  No - Comment as needed Significant Relationships:  Spouse Lives with:  Spouse Do you feel safe going back to the place where you live?    Need for family participation in patient care:     Care giving concerns:  Pt is alert and oriented. CSW met with pt at bedside. Pt's spouse was present. Pt was independent prior to hospitalizationl  Facilities manager / plan:  CSW spoke with pt at bedside. Pt is agreeable to SNF at d/c. Pt does not have a specific facility in mind however agreeable to [facility] f/o. CSW will provide b/o once available.   Employment status:  Retired Forensic scientist:  Medicare PT Recommendations:  [place] / Referral to community resources:  [place]  Patient/Family's Response to care:  Pt verbalized understanding of CSW role and expressed appreciation for support. Pt denies any concern regarding pt care at this time.   Patient/Family's Understanding of and Emotional Response to Diagnosis, Current Treatment, and Prognosis:  Pt understanding and realistic regarding physical limitations. Pt understands the need for SNF placement at d/c. Pt agreeable to SNF placement at d/c, at this time. Pt's responses emotionally appropriate during conversation  with CSW. Pt denies any concern regarding treatment plan at this time. CSW will continue to provide support and facilitate d/c needs.   Emotional Assessment Appearance:  Appears stated age Attitude/Demeanor/Rapport:  (Patient was appropriate) Affect (typically observed):  Accepting, Appropriate, Calm Orientation:  Oriented to Self, Oriented to Place, Oriented to  Time, Oriented to Situation Alcohol / Substance use:  Not Applicable Psych involvement (Current and /or in the community):  No (Comment)  Discharge Needs  Concerns to be addressed:  Basic Needs, Care Coordination Readmission within the last 30 days:  No Current discharge risk:  Dependent with Mobility Barriers to Discharge:  Continued Medical Work up   [employer], LCSW 05/17/2017, 3:47 PM

## 2017-05-17 NOTE — NC FL2 (Signed)
[place] LEVEL OF CARE SCREENING TOOL     IDENTIFICATION  Patient Name: Christopher [name] Birthdate: November 21, 1926 Sex: male Admission Date (Current Location): 05/15/2017  [facility] and [place] Number:  Herbalist and Address:  [facility] [place], [place] [address] [place], [place] [address]      Provider Number: [id number]  Attending Physician Name and Address:  Lind Covert, MD  Relative Name and Phone Number:       Current Level of Care: Hospital Recommended Level of Care: [place] Prior Approval Number:    Date Approved/Denied:   PASRR Number:    Discharge Plan: SNF    Current Diagnoses: Patient Active Problem List   Diagnosis Date Noted  . Uremia   . Acute renal failure superimposed on stage 5 chronic kidney disease, not on chronic dialysis ([place])   . Palliative care encounter   . Azotemia 05/15/2017  . CHF (congestive heart failure) ([place]) 06/11/2016  . Chronic diastolic congestive heart failure, NYHA class 2 ([place]) 06/10/2016  . Acute pulmonary edema ([place]) 05/17/2016  . Obesity (BMI 30-39.9) 05/17/2016  . Anemia of chronic disease 10/05/2015  . Hypoxia 07/20/2015  . Acute on chronic diastolic CHF (congestive heart failure), NYHA class 2 ([place]) 07/24/2013  . DM type 2 causing renal disease ([place]) 07/24/2013  . Pulmonary nodule with restrictive lung disease 07/24/2013  . Malignant neoplasm of prostate ([place]) 07/16/2013  . History of amiodarone therapy 07/16/2012  . Paroxysmal atrial fibrillation ([place]) 08/07/2011  . Lumbar disc disease   . Hyperlipidemia   . Hypertensive heart disease without CHF   . CKD (chronic kidney disease), stage V ([place])   . Long-term (current) use of anticoagulants   . BPH (benign prostatic hyperplasia)   . Cardiac pacemaker in situ   . CAD (coronary artery disease) 03/06/2010    Orientation RESPIRATION BLADDER Height & Weight     Self, Time, Situation, Place  O2(Nasal Cannula;  3L) Continent Weight: 229 lb 11.5 oz (104.2 kg) Height:  6\' 1"  (185.4 cm)  BEHAVIORAL SYMPTOMS/MOOD NEUROLOGICAL BOWEL NUTRITION STATUS      Continent Diet(Heart healthy/carb modified, thin liquids)  AMBULATORY STATUS COMMUNICATION OF NEEDS Skin   Extensive Assist Verbally Normal                       Personal Care Assistance Level of Assistance  Bathing, Feeding, Dressing Bathing Assistance: Maximum assistance Feeding assistance: Independent Dressing Assistance: Maximum assistance     Functional Limitations Info  Hearing, Speech, Sight Sight Info: Adequate Hearing Info: Adequate Speech Info: Adequate    SPECIAL CARE FACTORS FREQUENCY  PT (By licensed PT), OT (By licensed OT)     PT Frequency: 2x OT Frequency: 2x            Contractures Contractures Info: Not present    Additional Factors Info  Code Status, Allergies Code Status Info: DNR Allergies Info: Crestor Rosuvastatin, Lipitor Atorvastatin, Statins, Penicillins, Tramadol           Current Medications (05/17/2017):  This is the current hospital active medication list Current Facility-Administered Medications  Medication Dose Route Frequency Provider Last Rate Last Dose  . acetaminophen (TYLENOL) tablet 650 mg  650 mg Oral Q6H PRN Sela Hua, MD   650 mg at 05/16/17 1104   Or  . acetaminophen (TYLENOL) suppository 650 mg  650 mg Rectal Q6H PRN Mayo, [name], MD      . Darbepoetin  Alfa (ARANESP) injection 100 mcg  100 mcg Subcutaneous Q Fri-1800 Madelon Lips, MD   100 mcg at 05/16/17 1824  . furosemide (LASIX) tablet 80 mg  80 mg Oral BID Shirley, [place], DO   80 mg at 05/17/17 1134  . HYDROcodone-acetaminophen (NORCO/VICODIN) 5-325 MG per tablet 1 tablet  1 tablet Oral Q6H PRN Mayo, [name], MD   1 tablet at 05/17/17 1328  . insulin aspart (novoLOG) injection 0-5 Units  0-5 Units Subcutaneous QHS Sela Hua, MD   4 Units at 05/17/17 0248  . insulin aspart (novoLOG) injection 0-9 Units   0-9 Units Subcutaneous TID WC Mayo, [name], MD   5 Units at 05/17/17 1322  . insulin glargine (LANTUS) injection 2 Units  2 Units Subcutaneous QHS Enid Derry, [place], DO      . metoprolol succinate (TOPROL-XL) 24 hr tablet 25 mg  25 mg Oral Daily Mayo, [name], MD   25 mg at 05/17/17 1133  . ondansetron (ZOFRAN) tablet 4 mg  4 mg Oral Q6H PRN Mayo, [name], MD       Or  . ondansetron [facility]) injection 4 mg  4 mg Intravenous Q6H PRN Mayo, [name], MD      . polyethylene glycol (MIRALAX / GLYCOLAX) packet 17 g  17 g Oral Daily Enid Derry, [place], DO   17 g at 05/17/17 1134  . predniSONE (DELTASONE) tablet 40 mg  40 mg Oral Q breakfast Enid Derry, [place], DO   40 mg at 05/17/17 1133  . warfarin (COUMADIN) tablet 6 mg  6 mg Oral ONCE-1800 Chambliss, Jeb Levering, MD      . Warfarin - Pharmacist Dosing Inpatient   Does not apply [id number] Lind Covert, MD         Discharge Medications: Please see discharge summary for a list of discharge medications.  Relevant Imaging Results:  Relevant Lab Results:   Additional Information SSN: [id number]  PALLATIVE WILL BE FOLLOWING  Shelton Silvas A Neshia Mckenzie, LCSW

## 2017-05-17 NOTE — Progress Notes (Signed)
ANTICOAGULATION CONSULT NOTE - Initial Consult  Pharmacy Consult for Coumadin Indication: h/o atrial fibrillation  Allergies  Allergen Reactions  . Crestor [Rosuvastatin] Other (See Comments)    "ACHING JOINTS"  . Lipitor [Atorvastatin] Other (See Comments)    "ACHING JOINTS"  . Statins Other (See Comments)    "ACHING JOINTS"  . Penicillins Rash and Other (See Comments)    Has patient had a PCN reaction causing immediate rash, facial/tongue/throat swelling, SOB or lightheadedness with hypotension: Yes Has patient had a PCN reaction causing severe rash involving mucus membranes or skin necrosis: No Has patient had a PCN reaction that required hospitalization: No Has patient had a PCN reaction occurring within the last 10 years: No If all of the above answers are "NO", then may proceed with Cephalosporin use.   . Tramadol Nausea Only   Patient Measurements: Height: 6\' 1"  (185.4 cm) Weight: 229 lb 11.5 oz (104.2 kg) IBW/kg (Calculated) : 79.9 Heparin Dosing Weight: 101 kg  Assessment: 84 y.o male presented to [facility] ED on 05/15/17 with generalized weakness x 2 mos.  H/o Afib, on Coumadin 4mg  daily exc 6mg  on Fri PTA. INR on admit low at 1.37. No missed doses noted.   INR subtherapeutic (1.42) after 2 days of Coumadin 6 mg doses.  Goal of Therapy:  INR 2-3 Monitor platelets by anticoagulation protocol: Yes   Plan:  Give Coumadin 6 mg  x 1 Monitor daily INR, CBC, s/s of bleed  Angus Seller, PharmD Pharmacy Resident [phone] 05/17/2017 8:29 AM

## 2017-05-17 NOTE — Progress Notes (Signed)
Family Medicine Teaching Service Daily Progress Note Intern Pager: [phone]  Patient name: Christopher [name] Medical record number: [id number] Date of birth: 11-11-1926 Age: 84 y.o. Gender: male  Primary Care Provider: Prince Solian, MD Consultants: Palliative Code Status: DNR  Pt Overview and Major Events to Date:  3/07: Admitted   Assessment and Plan: GLENWOOD REVOIR is a 84 y.o. male presenting with generalized weakness. PMH is significant for CAD s/p CABG x 3 in 2003, pacemaker in place, pA-fib on Coumadin, HTN, HFpEF, HLD, CKD V, hx prostate cancer  Generalized Weakness, stable: Likely due to combination of dehydration, azotemia (BUN 106>99>92), and deconditioning.  - Troponins neg x3  - ESR elevated to 30, will give steroid burst  - PT/OT recommending SNF - Palliative care consulted and following  Azotemia in CKD IV: Follows with Dr. Moshe Cipro. Does not want to pursue HD. Takes Lasix 129m in the morning and 8172min the evening, as well as Metolazine 2.72m31maily as needed. BUN 106>99>92, Cr 2.93>2.50 (BL ~3). Patient does not appear volume overloaded on exam. No crackles or lower edema swelling.  - Nephrology consulted, appreciate recommendations - Hold Metolazone for now  - Will restart Lasix at 80 mg BID   CAD s/p CABG: in 2003. No chest pain currently. - Continue home Metoprolol succinate 272m79mily  Paroxysmal A-fib: In A-fib but rate controlled with HRs in the 70s. On Coumadin at home. CHADSVASc is a 5. - Coumadin per pharm - Continue home Metoprolol succinate 272mg33mly  HTN: Normotensive in the ED. - Continue home Metoprolol succinate 272mg 59my  T2DM: On Amaryl 0.72mg at107mme. A1c 8.6. - Hold Amaryl while hospitalized and would recommend not restarting on discharge due to patient's age - CBGs with meals and at bedtime - Sensitive SSI - Will start Lantus 2 U while on steroids as patient received 20U novolog  HFpEF: Last ECHO in 2015 with EF 55-60%, mild  LVH, indeterminate diastolic dysfunction, moderately dilated left atrium - Holding Metolazone  - Will restart Lasix  HLD: Not on a statin due to intolerance. Last lipid panel 06/2014 with Chol 218, HDL 39, LDL 142, TG 186 - Will not recheck lipid panel because do not think it will change management  Anemia of Chronic Disease: On Aranesp. Hgb 9.0 in the ED (BL 9-10). - Per nephro- ordered yesterday - Daily CBCs  Hx Prostate Cancer: stable. Cannot see any records about this.  - Monitor  Chronic Pain: due to osteoarthritis - Continue home Norco 5-3272mg q644mprn  FEN/GI: Heart healthy carb-modified Prophylaxis: Coumadin  Disposition: inpatient level of care, awaiting SNF placement  Subjective:  Patient reporting he felt better this morning but he is short of breath. Patient on home [place] of 3L most of the times, but he currently has his  in place but it is turned off on the wall. After turning it to his home 3L patient reported feeling much better.   Objective: Temp:  [97.6 F (36.4 C)-98.6 F (37 C)] 98.6 F (37 C) (03/09 0857) Pulse Rate:  [71-74] 74 (03/09 0857) Resp:  [18-20] 18 (03/09 0857) BP: (116-148)/(55-69) 116/55 (03/09 0857) SpO2:  [93 %-100 %] 93 % (03/09 0857) Physical Exam: General: NAD, pleasant Eyes: PERRL, EOMI, no conjunctival pallor or injection ENTM: Moist mucous membranes, no pharyngeal erythema or exudate Neck: Supple, no LAD Cardiovascular: RRR, no m/r/g, no LE edema Respiratory: CTA BL, normal work of breathing Gastrointestinal: soft, nontender, nondistended, normoactive BS MSK: moves 4 extremities equally Derm:  no rashes appreciated Neuro: CN II-XII grossly intact Psych: AOx3, appropriate affect  Laboratory: Recent Labs  Lab 05/15/17 1346 05/16/17 0553  WBC 6.7 4.8  HGB 9.0* 9.0*  HCT 29.6* 30.0*  PLT 353 356   Recent Labs  Lab 05/15/17 1346 05/16/17 0553 05/16/17 2234 05/17/17 0455  NA 136 137  --  135  K 4.7 4.8  --  5.3*   CL 95* 97*  --  96*  CO2 27 28  --  26  BUN 106* 99*  --  92*  CREATININE 2.93* 2.68*  --  2.50*  CALCIUM 8.9 9.0  --  9.1  PROT 6.6 6.1*  --   --   BILITOT 1.1 1.1  --   --   ALKPHOS 58 49  --   --   ALT 30 28  --   --   AST 43* 38  --   --   GLUCOSE 367* 216* 416* 293*   A1c 8.6  Imaging/Diagnostic Tests: No results found.  Jnai Snell[place], [place], DO 05/17/2017, 1:00 PM PGY-1, [place] Intern pager: [phone], text pages welcome

## 2017-05-17 NOTE — Progress Notes (Signed)
[facility] Rounding Note  Background: 55M PMH HTN, HLD, CAD s/p CABG, Afib, anemia, and CKD V seen in consultation at the request of Dr. Erin Hearing for eval and recs re: progressive CKD. Presented w/progresive weakness, unable to get out of bed. At his last visit with Dr. Moshe Cipro ~2 months ago expressed would never want to do dialysis. (Had initially planning to do dialysis, after 3 failed accesses decided against) Last Cr 2.8 and BUN 88 at [facility]  10/2016. Admit BUN/creatiine 106/2.9  Assessment/Plan 1. Progressive CKD:  Adm BUN 106/Cr 2.9, some progression past months. Very clear that he doesn't want to do dialysis. Poor po, taking lasix/metolazone at home. Held on adm, slow IVF for 12 hours, ^SOB so stopped. Still some breathing issues - lasix resumed by admitting team.  Agree with this. Labs better, creatinine down some  2. Anemia:  Gets aranesp 100 mcg q 4 weeks- last dose 04/18/2017. Redosed 3/8.   3. Chronic dCHF: Admission CXR no edema. Exp wheezing with IVF's - stopped    4. Weakness: Uremia + frailty + debility + deconditioning. OT/PT evaluating. Getting steroid burst by primary team for mild ESR elev.  5. Disposition - Palliative Care has seen. Pt is DNR. Will probably need SNF.   Jamal Maes, MD [facility] [phone] Pager 05/17/2017, 2:08 PM   Subjective/Interval History:  Still hurts all over Labs a little better Still SOB Primary has resumed some lasix (80 BID)  Objective Vital signs in last 24 hours: Vitals:   05/16/17 1821 05/16/17 2134 05/17/17 0607 05/17/17 0857  BP: 128/69 (!) 148/64 126/62 (!) 116/55  Pulse: 71 74 73 74  Resp: 18 18 20 18   Temp: 97.8 F (36.6 C) 97.6 F (36.4 C) 98 F (36.7 C) 98.6 F (37 C)  TempSrc: Oral Oral Oral Oral  SpO2: 99% 100% 97% 93%  Weight:      Height:       Weight change:   Intake/Output Summary (Last 24 hours) at 05/17/2017 1408 Last data filed at 05/17/2017 0900 Gross per 24 hour  Intake 360 ml  Output 700 ml   Net -340 ml   Physical Exam:  Blood pressure (!) 116/55, pulse 74, temperature 98.6 F (37 C), temperature source Oral, resp. rate 18, height 6' 1"  (1.854 m), weight 104.2 kg (229 lb 11.5 oz), SpO2 93 %.  Pale very nice older WM Lying very still VS as noted Bilat soft exp wheeze unchanged S1S2 No S3 Abd soft Trace LE edema  Recent Labs  Lab 05/15/17 1346 05/16/17 0553 05/16/17 2234 05/17/17 0455  NA 136 137  --  135  K 4.7 4.8  --  5.3*  CL 95* 97*  --  96*  CO2 27 28  --  26  GLUCOSE 367* 216* 416* 293*  BUN 106* 99*  --  92*  CREATININE 2.93* 2.68*  --  2.50*  CALCIUM 8.9 9.0  --  9.1  PHOS  --   --   --  4.7*    Recent Labs  Lab 05/15/17 1346 05/16/17 0553 05/17/17 0455  AST 43* 38  --   ALT 30 28  --   ALKPHOS 58 49  --   BILITOT 1.1 1.1  --   PROT 6.6 6.1*  --   ALBUMIN 2.9* 3.0* 3.0*    Recent Labs  Lab 05/15/17 1346 05/16/17 0553  WBC 6.7 4.8  HGB 9.0* 9.0*  HCT 29.6* 30.0*  MCV 102.1* 102.7*  PLT 353 356  Recent Labs  Lab 05/15/17 1346 05/15/17 2107 05/16/17 0553  CKTOTAL  --   --  39*  TROPONINI 0.04* 0.03* 0.04*   CBG: Recent Labs  Lab 05/16/17 2132 05/17/17 0239 05/17/17 0425 05/17/17 0754 05/17/17 1145  GLUCAP 424* 328* 263* 268* 278*    Dg Chest 2 View Result Date: 05/15/2017 CLINICAL DATA:  Patient experiencing SOB for 2-3 months. Increasing weakness and aches for 1 year. Chest pain that comes and goes. HTN takes medication. Diabetic takes medication. Hx of A-fib, CAD, cardiac pacemaker, CHF. EXAM: CHEST - 2 VIEW COMPARISON:  06/10/2016 FINDINGS: Status post median sternotomy and CABG. Patient's LEFT-sided transvenous pacemaker leads to the RIGHT atrium and RIGHT ventricle. Shallow lung inflation. The heart is enlarged. There is mild streaky atelectasis at the lung bases. No consolidations or evidence for pulmonary edema. IMPRESSION: Cardiomegaly.  No evidence for acute  abnormality. Electronically Signed   By: Nolon Nations  M.D.   On: 05/15/2017 14:55   Medications:  . darbepoetin (ARANESP) injection - NON-DIALYSIS  100 mcg Subcutaneous Q Fri-1800  . furosemide  80 mg Oral BID  . insulin aspart  0-5 Units Subcutaneous QHS  . insulin aspart  0-9 Units Subcutaneous TID WC  . insulin glargine  2 Units Subcutaneous QHS  . metoprolol succinate  25 mg Oral Daily  . polyethylene glycol  17 g Oral Daily  . predniSONE  40 mg Oral Q breakfast  . warfarin  6 mg Oral ONCE-1800  . Warfarin - Pharmacist Dosing Inpatient   Does not apply [phone]

## 2017-05-17 NOTE — Discharge Summary (Signed)
[facility] Discharge Summary  Patient name: Christopher [name] Medical record number: [id number] Date of birth: 07/28/1926 Age: 84 y.o. Gender: male Date of Admission: 05/15/2017  Date of Discharge: 05/21/17  Admitting Physician: Lind Covert, MD  Primary Care Provider: Prince Solian, MD Consultants: Palliative, Neurology, Nephrology  Indication for Hospitalization: worsening weakness  Discharge Diagnoses/Problem List:  Generalized weakness secondary to dehydration and azotemia CKD stage IV HfpEF CAD s/p CABG PAF HTN Anemia of Chronic Disease H/o prostate cancer Chronic pain  Disposition: SNF  Discharge Condition: stable  Discharge Exam: General: NAD, pleasant Eyes: PERRL, EOMI, no conjunctival pallor or injection ENTM: Moist mucous membranes Cardiovascular: RRR, no m/r/g, no LE edema Respiratory: CTA BL, normal work of breathing, on 3L [place], no crackles or wheezing noted Gastrointestinal: soft, nontender, nondistended, normoactive BS MSK: moves 4 extremities equally Derm: no rashes appreciated Neuro: CN II-XII grossly intact Psych: AOx3, appropriate affect  Brief Hospital Course:  This 84 year old male presented to the ED with worsening generalized weakness.  Past medical history is significant for CAD status post CABG x3 in 2003, pacemaker in place, paroxysmal atrial fibrillation on Coumadin, hypertension, HFp HLD, EF, CKD IV, and history of prostate cancer.  On admission patient was given gentle hydration overnight and weakness thought to be due to combination of dehydration and azotemia with BUN of 106.  Deconditioning also thought to play a large part in this.  CHF exacerbation is unlikely with a BNP of 300 on admission when previously had been between 900 1100.  TSH and CK were normal.  For patient's azotemia he is followed by Dr. Moshe Cipro and does not wish to pursue HD.  Patient's Lasix and metolazone were held initially.  Lasix was  restarted during hospitalization at Lasix 80 mg twice daily.  Palliative care was consulted during hospitalization per family request.  They recommended rehab with palliative care to follow at the facility.  Option of hospice was also discussed with patient and wife and they were receptive to this.  Hospice was recommended to follow with him at home after rehabilitation.  Nephrology also followed along during hospitalization.  Neurology was consulted during stay  due to truncal ataxia that was noted.  However a CT head without contrast showed no acute changes and no further workup was requested by neurology.  Recommended that if patient decides against hospice that he follow-up with neurology outpatient.  Patient was found to have an ESR elevated to 30 and a steroid burst was attempted, but given no resolution of symptoms this was stopped after 3 days.  Patient did have elevated blood glucose due to steroid burst and A1c on admission was noted to be elevated to 8.6.    Prior to discharge patient was stable on current treatments. Weight here stable prior to discharge at 247lb. Home weight 230 pounds per patient. Patient will be discharged to a SNF in order to work on his generalized weakness and due to inability to have safe care provided at home.  Issues for Follow Up:  1. Patient started on Lantus while admitted, but on steroid treatment, did not continue, monitor sugars closely. Previously on Amaryl, but will discontinue due to patient's age. 2. Follow up with [facility] in 6 weeks, if not pursuing hospice. 3. Patient changed to Lasix 89m BID. Home dose was Lasix 1634min am and 8049mn pm, and also was taking metolazone. Patient's metolazone held on discharge. May consider adjusting if patient appears fluid overloaded  on exam.  Significant Procedures: none  Significant Labs and Imaging:  Recent Labs  Lab 05/19/17 0828 05/20/17 0356 05/21/17 0455  WBC 9.4 9.9 7.5  HGB 8.9*  8.9* 9.4*  HCT 29.4* 29.4* 30.4*  PLT 403* 432* 429*   Recent Labs  Lab 05/15/17 1346 05/16/17 0553  05/17/17 0455 05/18/17 0451 05/19/17 0828 05/20/17 0356 05/21/17 0455  NA 136 137  --  135 135 136 137 137  K 4.7 4.8  --  5.3* 5.5* 4.8 4.8 4.7  CL 95* 97*  --  96* 98* 98* 97* 97*  CO2 27 28  --  _0 GLUCOSE 367* 216*   < > 293* 305* 280* 282* 237*  BUN 106* 99*  --  92* 96* 97* 101* 98*  CREATININE 2.93* 2.68*  --  2.50* 2.57* 2.51* 2.59* 2.38*  CALCIUM 8.9 9.0  --  9.1 9.2 9.2 9.0 8.9  PHOS  --   --   --  4.7* 6.0*  --   --   --   ALKPHOS 58 49  --   --   --   --   --   --   AST 43* 38  --   --   --   --   --   --   ALT 30 28  --   --   --   --   --   --   ALBUMIN 2.9* 3.0*  --  3.0* 3.1*  --   --   --    < > = values in this interval not displayed.   3/10 folate: WNL 3/10 B12: 1,002 (WNL) 3/10 INR: 1.60 3/08 CK: WNL 3/08 TSH: WNL 3/08 troponin x3: Plateaued 0.04 3/08 hemoglobin A1c: 8.6 3/08 iron studies: Anemia of chronic disease  CT Head without (05/18/17): IMPRESSION: 1. Streak artifact versus left pontine infarct that was not seen on 2017 comparison. Please correlate for associated deficit. 2. Moderate atrophy that has progressed from 2017.  DG Chest 2 View (05/15/17): IMPRESSION: Cardiomegaly. No evidence for acute abnormality.  Results/Tests Pending at Time of Discharge: none  Discharge Medications:  Allergies as of 05/21/2017      Reactions   Crestor [rosuvastatin] Other (See Comments)   "ACHING JOINTS"   Lipitor [atorvastatin] Other (See Comments)   "ACHING JOINTS"   Statins Other (See Comments)   "ACHING JOINTS"   Penicillins Rash, Other (See Comments)   Has patient had a PCN reaction causing immediate rash, facial/tongue/throat swelling, SOB or lightheadedness with hypotension: Yes Has patient had a PCN reaction causing severe rash involving mucus membranes or skin necrosis: No Has patient had a PCN reaction that required  hospitalization: No Has patient had a PCN reaction occurring within the last 10 years: No If all of the above answers are "NO", then may proceed with Cephalosporin use.   Tramadol Nausea Only      Medication List    STOP taking these medications   [place] 300 MG Caps   metolazone 2.5 MG tablet Commonly known as:  ZAROXOLYN     TAKE these medications   carboxymethylcellulose 0.5 % Soln Commonly known as:  REFRESH PLUS Place 1 drop into both eyes 3 (three) times daily as needed (for dry eyes.).   Darbepoetin Alfa 100 MCG/0.5ML Sosy injection Commonly known as:  ARANESP Inject 100 mcg into the skin every 21 ( twenty-one) days.   ezetimibe 10 MG tablet Commonly known as:  ZETIA Take 1  tablet (10 mg total) by mouth daily. Start taking on:  05/22/2017   Fish Oil 1200 MG Caps Take 1,200 mg by mouth 2 (two) times daily.   folic acid 793 MCG tablet Commonly known as:  FOLVITE Take 400 mcg by mouth daily.   furosemide 80 MG tablet Commonly known as:  LASIX Take 1 tablet (80 mg total) by mouth 2 (two) times daily. Take 160 mg in the morning and 80 mg in the evening. What changed:  how much to take   glimepiride 1 MG tablet Commonly known as:  AMARYL Take 1 tablet (1 mg total) by mouth daily before breakfast. What changed:  how much to take   HYDROcodone-acetaminophen 5-325 MG tablet Commonly known as:  NORCO/VICODIN Take 1 tablet by mouth every 6 (six) hours as needed. For pain. What changed:    reasons to take this  additional instructions   metoprolol succinate 25 MG 24 hr tablet Commonly known as:  TOPROL-XL Take 25 mg by mouth daily.   MIRALAX PO Take 17 g by mouth daily.   multivitamin with minerals Tabs tablet Take 1 tablet by mouth daily.   nitroGLYCERIN 0.4 MG SL tablet Commonly known as:  NITROSTAT Place 0.4 mg under the tongue every 5 (five) minutes as needed for chest pain.   OXYGEN Inhale 3 L into the lungs continuous.   ULORIC 40 MG  tablet Generic drug:  febuxostat Take 40 mg by mouth daily.   warfarin 3 MG tablet Commonly known as:  COUMADIN Take as directed. If you are unsure how to take this medication, talk to your nurse or doctor. Original instructions:  Take 1 tablet (3 mg total) by mouth daily.   warfarin 4 MG tablet Commonly known as:  COUMADIN Take as directed. If you are unsure how to take this medication, talk to your nurse or doctor. Original instructions:  Take 4 mg by mouth in the evening on Sun/Mon/Tues/Wed/Thurs/Sat and 6 mg on Fri            [place]  (From admission, onward)        Start     Ordered   05/20/17 2013  For home use only DME 3 n 1  Once     05/20/17 2013   05/20/17 2013  For home use only DME Hospital bed  Once    Question:  Bed type  Answer:  Semi-electric   05/20/17 2013      Discharge Instructions: Please refer to Patient Instructions section of EMR for full details.  Patient was counseled important signs and symptoms that should prompt return to medical care, changes in medications, dietary instructions, activity restrictions, and follow up appointments.   Follow-Up Appointments:  Contact information for follow-up providers    Avva, Ravisankar, MD Follow up.   Specialty:  Internal Medicine Contact information: [place] [address] [phone]            Contact information for after-discharge care    Destination    [facility] .   Service:  Skilled Nursing Contact information: [place] [phone]                  Cedar Ditullio, [place], [place] 05/21/2017, 12:27 PM PGY-1, [place]

## 2017-05-18 ENCOUNTER — Inpatient Hospital Stay (HOSPITAL_COMMUNITY): Payer: Medicare Other

## 2017-05-18 DIAGNOSIS — R9089 Other abnormal findings on diagnostic imaging of central nervous system: Secondary | ICD-10-CM

## 2017-05-18 LAB — RENAL FUNCTION PANEL
Albumin: 3.1 g/dL — ABNORMAL LOW (ref 3.5–5.0)
Anion gap: 12 (ref 5–15)
BUN: 96 mg/dL — AB (ref 6–20)
CHLORIDE: 98 mmol/L — AB (ref 101–111)
CO2: 25 mmol/L (ref 22–32)
CREATININE: 2.57 mg/dL — AB (ref 0.61–1.24)
Calcium: 9.2 mg/dL (ref 8.9–10.3)
GFR calc Af Amer: 24 mL/min — ABNORMAL LOW (ref >60)
GFR calc non Af Amer: 21 mL/min — ABNORMAL LOW (ref >60)
GLUCOSE: 305 mg/dL — AB (ref 65–99)
Phosphorus: 6 mg/dL — ABNORMAL HIGH (ref 2.5–4.6)
Potassium: 5.5 mmol/L — ABNORMAL HIGH (ref 3.5–5.1)
Sodium: 135 mmol/L (ref 135–145)

## 2017-05-18 LAB — GLUCOSE, CAPILLARY
GLUCOSE-CAPILLARY: 441 mg/dL — AB (ref 65–99)
GLUCOSE-CAPILLARY: 457 mg/dL — AB (ref 65–99)
Glucose-Capillary: 247 mg/dL — ABNORMAL HIGH (ref 65–99)
Glucose-Capillary: 261 mg/dL — ABNORMAL HIGH (ref 65–99)
Glucose-Capillary: 374 mg/dL — ABNORMAL HIGH (ref 65–99)

## 2017-05-18 LAB — VITAMIN B12: Vitamin B-12: 1002 pg/mL — ABNORMAL HIGH (ref 180–914)

## 2017-05-18 LAB — PROTIME-INR
INR: 1.6
PROTHROMBIN TIME: 19 s — AB (ref 11.4–15.2)

## 2017-05-18 MED ORDER — INSULIN GLARGINE 100 UNIT/ML ~~LOC~~ SOLN
4.0000 [IU] | Freq: Every day | SUBCUTANEOUS | Status: DC
  Administered 2017-05-18 – 2017-05-20 (×3): 4 [IU] via SUBCUTANEOUS
  Filled 2017-05-18 (×3): qty 0.04

## 2017-05-18 MED ORDER — INSULIN ASPART 100 UNIT/ML ~~LOC~~ SOLN
7.0000 [IU] | Freq: Once | SUBCUTANEOUS | Status: AC
  Administered 2017-05-18: 7 [IU] via SUBCUTANEOUS

## 2017-05-18 MED ORDER — WARFARIN SODIUM 6 MG PO TABS
6.0000 mg | ORAL_TABLET | Freq: Once | ORAL | Status: AC
  Administered 2017-05-18: 6 mg via ORAL
  Filled 2017-05-18: qty 1

## 2017-05-18 MED ORDER — EZETIMIBE 10 MG PO TABS
10.0000 mg | ORAL_TABLET | Freq: Every day | ORAL | Status: DC
Start: 2017-05-18 — End: 2017-05-21
  Administered 2017-05-18 – 2017-05-21 (×4): 10 mg via ORAL
  Filled 2017-05-18 (×4): qty 1

## 2017-05-18 NOTE — Progress Notes (Signed)
MD aware of CBG greater than 400, will give 9 units of SS insulin

## 2017-05-18 NOTE — Progress Notes (Signed)
Family Medicine Teaching Service Daily Progress Note Intern Pager: [phone]  Patient name: Christopher [name] Medical record number: [id number] Date of birth: Dec 28, 1926 Age: 84 y.o. Gender: male  Primary Care Provider: Prince Solian, MD Consultants: Palliative Code Status: DNR  Pt Overview and Major Events to Date:  3/07: Admitted  3/09: Palliative consulted, recommending outpatient palliative and hospice, PT recommending SNF  Assessment and Plan: Christopher [name] is a 84 y.o. male presenting with generalized weakness. PMH is significant for CAD s/p CABG x 3 in 2003, pacemaker in place, pA-fib on Coumadin, HTN, HFpEF, HLD, CKD V, hx prostate cancer  Generalized weakness secondary to dehydration and azotemia  CKD stage IV: Acute.  Weakness slightly improved though not at baseline per patient.  Followed by Dr. Moshe [name] for nephrology.  Patient adamant about not pursuing HD.  Nephrology in agreement for continued Lasix.  ESR elevated at 30 on admission concerning for possible autoimmune or inflammatory reaction.  Currently receiving daily steroids without improvement.  Remains euvolemic with 2.2 Loutput over the last 24 hours. - Neurology consulted, appreciate recommendations - Holding metolazone, continue Lasix 80 mg twice daily (home dose Lasix 160 mg twice daily) - Relative consulted, appreciate recommendations, plan for outpatient relative pain hospice - PT consulted, recommending SNF placement  HFpEF  CAD status post CABG  PAF: CABG x3 in 2003.  Last echo 55-60%, mild LVH, indeterminate diastolic dysfunction with moderate dilated left atrium.  Patient without chest pain since admission.  Continuing home beta-blocker.  He remains rate controlled.  CHADSVASc 5 on Coumadin at home. - Continue home metoprolol succinate 25 mg daily - Coumadin per pharmacy  Primary hypertension: Chronic.  Remains normotensive. - Continue home metoprolol succinate 25 mg daily  None insulin dependent  diabetes mellitus type 2: Chronic.  Uncontrolled, last A1c 8.6.  Not on home insulin.  Currently requiring Lantus and sliding scale likely due to daily use of steroids. - Holding home Amaryl 0.5 mg daily - Continue sensitive sliding scale and Lantus 2 units daily  Anemia of chronic disease: On Aranesp.  Hemoglobin remained stable at baseline (9-10).  No signs of active bleeding. - Monitoring hemoglobin and signs of active bleeding  History of prostate cancer  Chronic pain: Stable.  No records per chart review. - Palliative and hospice per above - Continue home Norco 5-325 mg every 6 hours as needed for pain  FEN/GI: Heart healthy carb-modified Prophylaxis: Coumadin  Disposition: Awaiting SNF placement, outpatient palliative and hospice.  Subjective:  Patient continues to have pain in his right shoulder and hand which is chronic.  He has just received his home Norco prior to me coming into the room.  He denies any shortness of breath or chest pain.  Patient and wife without concerns today hopeful for SNF placement soon.  Objective: Temp:  [97.8 F (36.6 C)-98.1 F (36.7 C)] 97.8 F (36.6 C) (03/10 0900) Pulse Rate:  [69-71] 69 (03/10 0900) Resp:  [18] 18 (03/10 0900) BP: (113-140)/(54-63) 118/54 (03/10 0900) SpO2:  [97 %-100 %] 97 % (03/10 0900) Weight:  [248 lb (112.5 kg)] 248 lb (112.5 kg) (03/09 2248) Physical Exam: General: elderly gentleman, NAD with non-toxic appearance HEENT: normocephalic, atraumatic, moist mucous membranes Neck: supple, non-tender without lymphadenopathy Cardiovascular: regular rate and rhythm without murmurs, rubs, or gallops Lungs: clear to auscultation bilaterally with normal work of breathing Abdomen: soft, non-tender, non-distended, normoactive bowel sounds Skin: warm, dry, no rashes or lesions, cap refill < 2 seconds Extremities: warm and well perfused, normal  tone, no edema MSK: Moving all 4 extremities with 5/5 equal strength with exception  of right hand grip secondary to tenderness, mild tenderness to posterior right shoulder Neuro: A&Ox3, grossly intact throughout, no dysarthria or facial droop  Laboratory: Recent Labs  Lab 05/15/17 1346 05/16/17 0553  WBC 6.7 4.8  HGB 9.0* 9.0*  HCT 29.6* 30.0*  PLT 353 356   Recent Labs  Lab 05/15/17 1346 05/16/17 0553 05/16/17 2234 05/17/17 0455 05/18/17 0451  NA 136 137  --  135 135  K 4.7 4.8  --  5.3* 5.5*  CL 95* 97*  --  96* 98*  CO2 27 28  --  26 25  BUN 106* 99*  --  92* 96*  CREATININE 2.93* 2.68*  --  2.50* 2.57*  CALCIUM 8.9 9.0  --  9.1 9.2  PROT 6.6 6.1*  --   --   --   BILITOT 1.1 1.1  --   --   --   ALKPHOS 58 49  --   --   --   ALT 30 28  --   --   --   AST 43* 38  --   --   --   GLUCOSE 367* 216* 416* 293* 305*   3/10 folate: Pending 3/10 B12: WNL 3/10 INR: 1.60 3/08 CK: WNL 3/08 TSH: WNL 3/08 troponin x3: Plateaued 0.04 3/08 hemoglobin A1c: 8.6 3/08 iron studies: Anemia of chronic disease  Imaging/Diagnostic Tests: DG Chest 2 View (05/15/17): IMPRESSION: Cardiomegaly.  No evidence for acute  abnormality.    [place], DO 05/18/2017, 11:05 AM PGY-2, [place] Intern pager: [phone], text pages welcome

## 2017-05-18 NOTE — Consult Note (Addendum)
NEUROHOSPITALISTS - STROKE CONSULTATION NOTE   Requesting Physician: Dr. Lexine Baton [facility]: Dr. Kathrynn Speed  Admit date: 05/15/2017    Chief Complaint: truncal ataxia  History obtained from:  Patient and Chart     HPI   Christopher [name] is an 84 y.o. male PMH of HTN, HLD, AFIB on Comadin, CHF, CAD, ESRD not on HD admitted to the hospital on 05/15/2017 for complaints of generalized weakness. Patient was found to be dehydrated and deconditioned. He was started on IVF's, steroids and his electrolytes were replaced. He has been working with PT/OT therapies since admission but continues to have weakness. Palliative care has been consulted for goals of care, as the patient is refusing HD treatments and patient is scheduled to be discharged soon to SNF.  Today he was noted to have some truncal ataxia on exam by the primary team. CT Head shows possible old Left pontine infarct but no acute stroke findings. Patient is unable to get MRI due to pacemaker and unable to get CTA due to elevated creatinine levels. Patient denies any history of stroke in the past. He states that the last few INR checks have been 2.0-2.5 range. He was unaware that his INR was sub therapeutic on admission.  Patient does not think he is off balance when he sits up in the bed. He denies any feeling that the room is spinning, etc..He states, "I am only off balance when I try to stand or walk". States that has been going on for the past 3 years, since his Right hip surgery. He also feels he has very bad neuropathy in his feet, left worse than right. When he tries to stand he has excruciating pain in his feet. He does not think anyone has "officially" diagnosed him with neuropathy. He admits to "almost" falling at home multiple times due to the pain in his feet and legs. At baseline, patient uses a walker and only takes a few steps at a time  before he sits down to rest due to pain in his legs/feet.  Date last known well: unknown Time last known well: unknown tPA Given: No: unknown timing Modified Rankin: Rankin Score=4 NIH Stroke Scale= 1, for decreased sensation in LE's, baseline finding  Past Medical History    Past Medical History:  Diagnosis Date  . Anemia   . Atrial fibrillation ([place]) 08/07/2011   CHADS score  2 CHADS2 VASC score 4  Onset winter of 2013 Cardioversion May 2013 Reversion to a fib 3/14 amiodarone begun 07/02/12  Repeat cardioversion Jul 16, 2012    . BPH (benign prostatic hyperplasia)   . CAD (coronary artery disease) 03/06/2010   CABG with LIMA to LAD, SVG to OM-OM2  05/05/01 Dr. Roxan Hockey  Cardiac Cath 12/2009 60% left main, occluded LAD, Widely patent SVG to OM1 and 2, patent LIMA, widely patent RCA    . Cardiac pacemaker in situ    07/26/10  Inserted for chronotropic incompetence and dyspnea  RA lead  Medtronic 5076  serial number [id number]   RV lead  Medtronic 5076  serial number [id number] Medtronic Adapta RL pulse generator, SN  [id number]    . Chronic diastolic congestive heart failure, NYHA class 2 ([place]) 06/10/2016  . Chronic kidney disease (CKD), stage IV (severe) (H[facility])    Dr. Mercy Moore  . Diabetes mellitus without complication ([place])    Type II  . History of skin cancer   . Hypertensive heart disease without CHF   . Lumbar disc disease  1992  . Prostate cancer ([place])   . Sinus node dysfunction (H[facility])    symptomatic bradycardia    Past Surgical History   Past Surgical History:  Procedure Laterality Date  . APPENDECTOMY  1980  . AV FISTULA PLACEMENT Left 11/08/2015   Procedure: ARTERIOVENOUS (AV) FISTULA CREATION;  Surgeon: [name], MD;  Location: [place];  Service: Vascular;  Laterality: Left;  . AV FISTULA PLACEMENT Left 01/03/2016   Procedure: Creation of BRACHIOCEPHALIC ARTERIOVENOUS (AV) FISTULA Left arm;  Surgeon: [name], MD;  [facility];  Service: Vascular;  Laterality:  Left;  . AV FISTULA PLACEMENT Right 05/14/2016   Procedure: RADIOCEPHALIC ARTERIOVENOUS (AV) FISTULA CREATION RIGHT LOWER ARM;  Surgeon: Elam Dutch, MD;  Location: [place];  Service: Vascular;  Laterality: Right;  . CARDIAC CATHETERIZATION  2003  . CARDIOVERSION  08/08/2011   Procedure: CARDIOVERSION;  Surgeon: Jacolyn Reedy, MD;  Location: [place];  Service: Cardiovascular;  Laterality: N/A;  . CARDIOVERSION N/A 07/16/2012   Procedure: CARDIOVERSION;  Surgeon: Jacolyn Reedy, MD;  Location: [facility] ENDOSCOPY;  Service: Cardiovascular;  Laterality: N/A;  . CARDIOVERSION N/A 02/27/2015   Procedure: CARDIOVERSION;  Surgeon: Jacolyn Reedy, MD;  Location: [facility] ENDOSCOPY;  Service: Cardiovascular;  Laterality: N/A;  . CARDIOVERSION N/A 09/21/2015   Procedure: CARDIOVERSION;  Surgeon: Jacolyn Reedy, MD;  Location: [facility] ENDOSCOPY;  Service: Cardiovascular;  Laterality: N/A;  . CARDIOVERSION N/A 02/22/2016   Procedure: CARDIOVERSION;  Surgeon: Dorothy Spark, MD;  Location: [facility] ENDOSCOPY;  Service: Cardiovascular;  Laterality: N/A;  . CARDIOVERSION N/A 06/12/2016   Procedure: CARDIOVERSION;  Surgeon: Jacolyn Reedy, MD;  Location: [facility] ENDOSCOPY;  Service: Cardiovascular;  Laterality: N/A;  . cataracts removed    . COLONOSCOPY    . CORONARY ARTERY BYPASS GRAFT  2003   x3  . CYSTOSCOPY WITH BIOPSY N/A 06/21/2013   Procedure: CYSTOSCOPY WITH BIOPSY;  Surgeon: Molli Hazard, MD;  Location: [facility] ORS;  Service: Urology;  Laterality: N/A;    TUR RESECTION OF POLYP BILATERAL RETROGRADE PYELOGRAM BLADDER BIOPSY    . EYE SURGERY Bilateral    Cataracts  . HIP PINNING,CANNULATED Right 06/30/2014   Procedure: CANNULATED HIP PINNING;  Surgeon: Renette Butters, MD;  Location: [place];  Service: Orthopedics;  Laterality: Right;  . LIGATION OF ARTERIOVENOUS  FISTULA Left 01/17/2016   Procedure: LIGATION OF ARTERIOVENOUS  FISTULA UPPER ARM FISTULA;  Surgeon: [name], MD;  Location: [place];  Service:  Vascular;  Laterality: Left;  . LUMBAR LAMINECTOMY  11/01  . PACEMAKER INSERTION  2012  . PROSTATE BIOPSY N/A 06/21/2013   Procedure: BIOPSY TRANSRECTAL ULTRASONIC PROSTATE (TUBP);  Surgeon: Molli Hazard, MD;  Location: [facility] ORS;  Service: Urology;  Laterality: N/A;  PROSTATE NERVE BLOCK    Family History   Family History  Problem Relation Age of Onset  . Cancer Mother        breast  . Diabetes Mother   . Cancer Father        prostate, esophagus   Social History   reports that he quit smoking about 49 years ago. His smoking use included cigarettes. He has a 18.00 pack-year smoking history. He quit smokeless tobacco use about 49 years ago. His smokeless tobacco use included snuff and chew. He reports that he does not drink alcohol or use drugs.  Allergies   Allergies  Allergen Reactions  . Crestor [Rosuvastatin] Other (See Comments)    "ACHING JOINTS"  .  Lipitor [Atorvastatin] Other (See Comments)    "ACHING JOINTS"  . Statins Other (See Comments)    "ACHING JOINTS"  . Penicillins Rash and Other (See Comments)    Has patient had a PCN reaction causing immediate rash, facial/tongue/throat swelling, SOB or lightheadedness with hypotension: Yes Has patient had a PCN reaction causing severe rash involving mucus membranes or skin necrosis: No Has patient had a PCN reaction that required hospitalization: No Has patient had a PCN reaction occurring within the last 10 years: No If all of the above answers are "NO", then may proceed with Cephalosporin use.   . Tramadol Nausea Only    Medications Prior to Admission   No current facility-administered medications on file prior to encounter.    Current Outpatient Medications on File Prior to Encounter  Medication Sig Dispense Refill  . carboxymethylcellulose (REFRESH PLUS) 0.5 % SOLN Place 1 drop into both eyes 3 (three) times daily as needed (for dry eyes.).    . Darbepoetin Alfa (ARANESP) 100 MCG/0.5ML SOSY injection Inject  100 mcg into the skin every 21 ( twenty-one) days.    . folic acid (FOLVITE) 026 MCG tablet Take 400 mcg by mouth daily.      . furosemide (LASIX) 80 MG tablet Take 1-2 tablets (80-160 mg total) by mouth 2 (two) times daily. Take 160 mg in the morning and 80 mg in the evening. (Patient taking differently: Take 160 mg by mouth 2 (two) times daily. )    . glimepiride (AMARYL) 1 MG tablet Take 1 tablet (1 mg total) by mouth daily before breakfast. (Patient taking differently: Take 0.5 mg by mouth daily before breakfast. ) 30 tablet 1  . Horse Chestnut 300 MG CAPS Take 300 mg by mouth daily.    Marland Kitchen HYDROcodone-acetaminophen (NORCO/VICODIN) 5-325 MG tablet Take 1 tablet by mouth every 6 (six) hours as needed. For pain. (Patient taking differently: Take 1 tablet by mouth every 6 (six) hours as needed (for pain). ) 6 tablet 0  . metolazone (ZAROXOLYN) 2.5 MG tablet Take 2.5 mg by mouth See admin instructions. "2.5 mg by mouth once a day as needed/based on weight"  10  . metoprolol succinate (TOPROL-XL) 25 MG 24 hr tablet Take 25 mg by mouth daily.    . Multiple Vitamin (MULTIVITAMIN WITH MINERALS) TABS tablet Take 1 tablet by mouth daily.    . nitroGLYCERIN (NITROSTAT) 0.4 MG SL tablet Place 0.4 mg under the tongue every 5 (five) minutes as needed for chest pain.    . Omega-3 Fatty Acids (FISH OIL) 1200 MG CAPS Take 1,200 mg by mouth 2 (two) times daily.     . OXYGEN Inhale 3 L into the lungs continuous.    . Polyethylene Glycol 3350 (MIRALAX PO) Take 17 g by mouth daily.     Marland Kitchen ULORIC 40 MG tablet Take 40 mg by mouth daily.  2  . warfarin (COUMADIN) 4 MG tablet Take 4 mg by mouth in the evening on Sun/Mon/Tues/Wed/Thurs/Sat and 6 mg on Fri  10  . warfarin (COUMADIN) 3 MG tablet Take 1 tablet (3 mg total) by mouth daily. (Patient not taking: Reported on 05/15/2017)      ROS  History obtained from the patient General ROS: negative for - chills, fatigue, fever, night sweats, weight gain or weight  loss Psychological ROS: negative for - behavioral disorder, hallucinations, memory difficulties, mood swings or suicidal ideation Ophthalmic ROS: negative for - blurry vision, double vision, eye pain or loss of vision ENT ROS:  negative for - epistaxis, nasal discharge, oral lesions, sore throat, tinnitus or vertigo Allergy and Immunology ROS: negative for - hives or itchy/watery eyes Hematological and Lymphatic ROS: negative for - bleeding problems, bruising or swollen lymph nodes Endocrine ROS: negative for - galactorrhea, hair pattern changes, polydipsia/polyuria or temperature intolerance Respiratory ROS: negative for - cough, hemoptysis, shortness of breath or wheezing Cardiovascular ROS: negative for - chest pain, dyspnea on exertion, edema or irregular heartbeat Gastrointestinal ROS: negative for - abdominal pain, diarrhea, hematemesis, nausea/vomiting or stool incontinence Genito-Urinary ROS: negative for - dysuria, hematuria, incontinence or urinary frequency/urgency Musculoskeletal ROS: positive for - muscular weakness in Right hip s/p surgery Neurological ROS: as noted in HPI Dermatological ROS: negative for rash and skin lesion changes  Physical Examination   Vitals:   05/17/17 0857 05/17/17 2248 05/18/17 0531 05/18/17 0900  BP: (!) 116/55 140/63 113/60 (!) 118/54  Pulse: 74 69 71 69  Resp: 18 18 18 18   Temp: 98.6 F (37 C) 98.1 F (36.7 C) 97.9 F (36.6 C) 97.8 F (36.6 C)  TempSrc: Oral Oral Oral Oral  SpO2: 93% 100% 100% 97%  Weight:  112.5 kg (248 lb)    Height:       HEENT-  Normocephalic,  Cardiovascular - Iregular rate and rhythm  Respiratory - Non-labored breathing, Mild expiratory wheezing. Abdomen - soft and non-tender, BS normal Extremities- no edema or cyanosis Skin-warm and dry with multiple bruises  Neurological Examination  Mental Status: Alert, oriented, thought content appropriate.  Speech fluent without evidence of aphasia or neglect.  Able to  follow 3 step commands without difficulty. Cranial Nerves: II: Visual Fields are full. Pupils are equal, round, and reactive to light.   III,IV, VI: EOMI without ptosis/ diploplia. No nystagmus.   V: Facial sensation is symmetric to temperature VII: Facial movement is symmetric.  VIII: hearing is intact to voice X: Uvula elevates symmetrically XI: Shoulder shrug is symmetric. XII: tongue is midline without atrophy or fasciculations.  Motor: Tone is normal. Bulk is normal. At baseline patient has 4+-5/5 strength throughout. At baseline patient has weak hand grip strength, right worse than left. At baseline has weak hip flexors, right worse than left Sensor: Sensation is symmetric but decreased in LE's. Deep Tendon Reflexes: 2+ UE's and 1+ in LE's Plantars: Toes are downgoing bilaterally.  Cerebellar: normal finger-to-nose bilaterally Gait: not tested. No truncal ataxia on exam with multiple testing  Laboratory Results  CBC: Recent Labs  Lab 05/15/17 1346 05/16/17 0553  WBC 6.7 4.8  HGB 9.0* 9.0*  HCT 29.6* 30.0*  MCV 102.1* 102.7*  PLT 353 976   Basic Metabolic Panel: Recent Labs  Lab 05/15/17 1346 05/16/17 0553 05/16/17 2234 05/17/17 0455 05/18/17 0451  NA 136 137  --  135 135  K 4.7 4.8  --  5.3* 5.5*  CL 95* 97*  --  96* 98*  CO2 27 28  --  26 25  GLUCOSE 367* 216* 416* 293* 305*  BUN 106* 99*  --  92* 96*  CREATININE 2.93* 2.68*  --  2.50* 2.57*  CALCIUM 8.9 9.0  --  9.1 9.2  PHOS  --   --   --  4.7* 6.0*   Liver Function Tests: No results for input(s): LIPASE, AMYLASE in the last 168 hours. No results for input(s): AMMONIA in the last 168 hours. Thyroid Function Studies: Recent Labs    05/16/17 0553  TSH 4.267   Cardiac Enzymes: Recent Labs  Lab 05/15/17 1346 05/15/17  2107 05/16/17 0553  CKTOTAL  --   --  39*  TROPONINI 0.04* 0.03* 0.04*   Coagulation Studies: Recent Labs    05/16/17 0553 05/17/17 0455 05/18/17 0451  INR 1.34 1.42 1.60    No results for input(s): DDIMER in the last 72 hours. [employer]    05/18/17 0451  VITAMINB12 1,002*   Microbiology: @MICRORSLT2 @ Urinalysis:  Recent Labs  Lab 05/15/17 1417  COLORURINE YELLOW  APPEARANCEUR CLEAR  LABSPEC 1.010  PHURINE 6.0  GLUCOSEU >=500*  HGBUR NEGATIVE  BILIRUBINUR NEGATIVE  KETONESUR NEGATIVE  PROTEINUR NEGATIVE  NITRITE NEGATIVE  LEUKOCYTESUR NEGATIVE   Urine Drug Screen:  No results found for: LABOPIA, COCAINSCRNUR, LABBENZ, AMPHETMU, THCU, LABBARB  Alcohol Level: No results for input(s): ETH in the last 168 hours.  Imaging Results  Ct Head Wo Contrast  Result Date: 05/18/2017 CLINICAL DATA:  Generalized weakness. EXAM: CT HEAD WITHOUT CONTRAST TECHNIQUE: Contiguous axial images were obtained from the base of the skull through the vertex without intravenous contrast. COMPARISON:  07/19/2015 FINDINGS: Brain: Infarct versus artifact in the left pons not seen in 2017. No acute hemorrhage or hydrocephalus. No masslike finding. There is generalized atrophy with mild progression since 2017. Vascular: Atherosclerotic calcification.  No hyperdense vessel. Skull: Negative Sinuses/Orbits: Bilateral cataract resection. No pathologic finding. IMPRESSION: 1. Streak artifact versus left pontine infarct that was not seen on 2017 comparison. Please correlate for associated deficit. 2. Moderate atrophy that has progressed from 2017. Electronically Signed   By: Monte Fantasia M.D.   On: 05/18/2017 18:07    IMPRESSION AND PLAN  Mr. Christopher [name] is a 84 y.o. male with PMH of HTN, HLD, AFIB on Coumadin, CHF, CAD, ESRD not on HD admitted to the hospital on 05/15/2017 for complaints of generalized weakness. Patient was found to be dehydrated and deconditioned. He was started on steroids and his electrolytes were replaced. Working with PT/OT therapies.   Today he was noted to have some truncal ataxia on exam by the primary team. CT Head shows possible old  Left pontine infarct but no acute stroke findings. Patient has no focal, acute neurological deficits on exam. No truncal ataxia was noted with multiple testing.  Patient is unable to get MRI due to pacemaker and unable to get CTA due to elevated creatinine levels.  Subacute/old Left Pontine infarct  with generalized weakness and Neuropathy  Suspected Etiology: AFIB on Coumadin with subtherapeutic INR Resultant Symptoms: none Stroke Risk Factors: atrial fibrillation, diabetes mellitus, hyperlipidemia and hypertension Other Stroke Risk Factors: Advanced age, ESRD, CHF, CAD, Prostate Cancer Obesity, Body mass index is 32.72 kg/m. , Hx stroke  Outstanding Stroke Work-up Studies:    Work up completed  PLAN  05/18/2017  Frequent Neurochecks  Telemetry Monitoring Continue Warfarin and maintain therapeutic INR of 2-3 Added Zetia, statin intolerant PT/OT/SLP Consult Case Management /MSW Ongoing aggressive stroke risk factor management Patient will be counseled to be compliant with his antithrombotic medications Patient will be counseled on Lifestyle modifications including, Diet, Exercise, and Stress Follow up with [facility] in 6 weeks if not pursuing hospice Agree with Palliative care consultation  MEDICAL ISSUES   AFIB, CHRONIC: Continue Coumadin with INR 2-3 Cardiology outpatient follow up Continue Metoprolol for Rate control  HYPERTENSION: Stable, Avoid Hypotension Long term BP goal normotensive. Home Meds: yes  HYPERLIPIDEMIA:    Component Value Date/Time   CHOL 218 (H) 06/29/2014 0550   TRIG 186 (H) 06/29/2014 0550   HDL 39 (L)  06/29/2014 0550   CHOLHDL 5.6 06/29/2014 0550   VLDL 37 06/29/2014 0550   LDLCALC 142 (H) 06/29/2014 0550  Home Meds:  NONE LDL  goal < 70 Statin allegies.   Zetia 10 mg added  DIABETES: Lab Results  Component Value Date   HGBA1C 8.6 (H) 05/15/2017  HgbA1c goal < 7.0 Currently on: Novolog Continue CBG monitoring and SSI  to maintain glucose 140-180 mg/dl DM education   OBESITY Obesity, Body mass index is 32.72 kg/m. Greater than/equal to [facility] day # 3 VTE prophylaxis: Warfarin Diet - Diet heart healthy/carb modified Room service appropriate? Yes; Fluid consistency: Thin     FAMILY UPDATES:  family at bedside  TEAM UPDATES:Chambliss, Jeb Levering, MD STATUS:    Discharge Information  Prior Home Stroke Medications: warfarin daily  Discharge Stroke Meds:  Please discharge patient on warfarin daily     Disposition: 06-Home-Health Care Svc Therapy Recs:               SNF  Follow up Appointments  Follow Up:  Follow-up Information    Avva, Ravisankar, MD Follow up.   Specialty:  Internal Medicine Contact information: [place] [address] [phone]          Prince Solian, MD -PCP Follow up in 1-2 weeks      Assessment and plan discussed with with attending physician and they are in agreement.    Mary Sella, ANP-C [facility] 05/18/2017, 7:02 PM  05/18/2017 ATTENDING ASSESSMENT  Patient seen and examined this evening.  Agree with the history and physical documented above.  I had a detailed conversation with the patient and his family at bedside.  He reports no new neurological symptoms.  The imaging finding of the CT head showing a pontine stroke versus artifact, is either chronic pontine infarct or truly an artifact as he has not had any changes in his neurological symptoms over the past 3 years. Would expect to see a dense hemiplegia with a pontine stroke of that size, which he obviously does not have.  He reports having truncal ataxia/instability for the past 3 years ever since he had his hip fracture. He also has severe neuropathy, which can also lead to a lot of gait instability and truncal instability-possibly sensory ataxia.  If the patient is pursuing hospice care and going forward with it, I would not pursue any aggressive  treatment changes or testing at this time as it is not likely to change his long term prognosis in any way. The patient and his wife are agreeable with continuing the current line of treatment as the primary team is currently pursuing and NOT doing any additional testing from a stroke risk factor work up perspective.  I have also relayed my plan in detail over the phone to the primary team.  Neurology service will be available as needed.  Please call us with questions.  I have also made edits to the note above- my edits are  in blue bold text.  -- Amie Portland, MD [facility] Pager: [phone] If 7pm to 7am, please call on call as listed on AMION.

## 2017-05-18 NOTE — Progress Notes (Addendum)
[facility] Rounding Note  Background: 40M PMH HTN, HLD, CAD s/p CABG, Afib, anemia, and CKD V seen in consultation at the request of Dr. Erin Hearing for eval and recs re: progressive CKD. Adm w/progresive weakness, unable to get OOB. At last visit w/ Dr. Moshe Cipro  expressed would not pursue dialysis. (Had initially planning to, after 3 failed accesses decided against) Last Cr 2.8 and BUN 88 at [facility] 10/2016. Admit BUN/creatiine 106/2.9  Assessment/Plan 1. Progressive CKD:  Adm BUN 106/Cr 2.9, some progression past months. Adamant about no dialysis. Lasix/metolazone held on adm, slow IVF for 12 hours, ^SOB so stopped. Lasix resumed 80 BID.  Labs better, creatinine down some, breathing more comfortably, but otherwise says not feeling any better  2. Anemia:  Gets aranesp 100 mcg q 4 weeks- last dose 04/18/2017. Redosed 3/8.   3. Chronic dCHF: Admission CXR no edema. Exp wheezing with IVF's - stopped/lower dose of lasix restsarted - comfortable on O2 today  4. Weakness: Uremia + frailty + debility + deconditioning. OT/PT evaluating. Getting steroid burst by primary team for mild ESR elev.  5. Disposition - Palliative Care has seen. Pt is DNR. SNF. Getting empiric steroids no help yet.   Dr. Moshe Cipro will see tomorrow.  Jamal Maes, MD [place] Pager 05/17/2017, 2:08 PM  Subjective/Interval History:  Still feels terrible  Prednisone not helping his diffuse aches and pains Breathing is better, appetite is "up and down" Back on lasix 80 BID (home dose 160/80 + metolazone)  Labs ~ same For SNF  Objective Vital signs in last 24 hours: Vitals:   05/17/17 0857 05/17/17 2248 05/18/17 0531 05/18/17 0900  BP: (!) 116/55 140/63 113/60 (!) 118/54  Pulse: 74 69 71 69  Resp: _0 Temp: 98.6 F (37 C) 98.1 F (36.7 C) 97.9 F (36.6 C) 97.8 F (36.6 C)  TempSrc: Oral Oral Oral Oral  SpO2: 93% 100% 100% 97%  Weight:  112.5 kg (248 lb)    Height:       Weight  change:   Intake/Output Summary (Last 24 hours) at 05/18/2017 1355 Last data filed at 05/18/2017 0837 Gross per 24 hour  Intake 340 ml  Output 2200 ml  Net -1860 ml   Physical Exam:  Blood pressure (!) 118/54, pulse 69, temperature 97.8 F (36.6 C), temperature source Oral, resp. rate 18, height _1  (1.854 m), weight 112.5 kg (248 lb), SpO2 97 %.  Pale very nice older WM VS as noted On O2 and appears comfortable (unless he moves) Lungs clear S1S2 No S3 Abd soft Trace LE edema  Recent Labs  Lab 05/15/17 1346 05/16/17 0553 05/16/17 2234 05/17/17 0455 05/18/17 0451  NA 136 137  --  135 135  K 4.7 4.8  --  5.3* 5.5*  CL 95* 97*  --  96* 98*  CO2 27 28  --  26 25  GLUCOSE 367* 216* 416* 293* 305*  BUN 106* 99*  --  92* 96*  CREATININE 2.93* 2.68*  --  2.50* 2.57*  CALCIUM 8.9 9.0  --  9.1 9.2  PHOS  --   --   --  4.7* 6.0*    Recent Labs  Lab 05/15/17 1346 05/16/17 0553 05/17/17 0455 05/18/17 0451  AST 43* 38  --   --   ALT 30 28  --   --   ALKPHOS 58 49  --   --   BILITOT 1.1 1.1  --   --  PROT 6.6 6.1*  --   --   ALBUMIN 2.9* 3.0* 3.0* 3.1*    Recent Labs  Lab 05/15/17 1346 05/16/17 0553  WBC 6.7 4.8  HGB 9.0* 9.0*  HCT 29.6* 30.0*  MCV 102.1* 102.7*  PLT 353 356    Recent Labs  Lab 05/15/17 1346 05/15/17 2107 05/16/17 0553  CKTOTAL  --   --  39*  TROPONINI 0.04* 0.03* 0.04*   CBG: Recent Labs  Lab 05/17/17 1721 05/17/17 2215 05/18/17 0020 05/18/17 0815 05/18/17 1137  GLUCAP 324* 422* 374* 261* 247*    Dg Chest 2 View Result Date: 05/15/2017 CLINICAL DATA:  Patient experiencing SOB for 2-3 months. Increasing weakness and aches for 1 year. Chest pain that comes and goes. HTN takes medication. Diabetic takes medication. Hx of A-fib, CAD, cardiac pacemaker, CHF. EXAM: CHEST - 2 VIEW COMPARISON:  06/10/2016 FINDINGS: Status post median sternotomy and CABG. Patient's LEFT-sided transvenous pacemaker leads to the RIGHT atrium and RIGHT  ventricle. Shallow lung inflation. The heart is enlarged. There is mild streaky atelectasis at the lung bases. No consolidations or evidence for pulmonary edema. IMPRESSION: Cardiomegaly.  No evidence for acute  abnormality. Electronically Signed   By: Nolon Nations M.D.   On: 05/15/2017 14:55   Medications:  . darbepoetin (ARANESP) injection - NON-DIALYSIS  100 mcg Subcutaneous Q Fri-1800  . furosemide  80 mg Oral BID  . insulin aspart  0-5 Units Subcutaneous QHS  . insulin aspart  0-9 Units Subcutaneous TID WC  . insulin glargine  2 Units Subcutaneous QHS  . metoprolol succinate  25 mg Oral Daily  . polyethylene glycol  17 g Oral Daily  . predniSONE  40 mg Oral Q breakfast  . warfarin  6 mg Oral ONCE-1800  . Warfarin - Pharmacist Dosing Inpatient   Does not apply [phone]

## 2017-05-18 NOTE — Progress Notes (Signed)
ANTICOAGULATION CONSULT NOTE - Initial Consult  Pharmacy Consult for Coumadin Indication: h/o atrial fibrillation  Allergies  Allergen Reactions  . Crestor [Rosuvastatin] Other (See Comments)    "ACHING JOINTS"  . Lipitor [Atorvastatin] Other (See Comments)    "ACHING JOINTS"  . Statins Other (See Comments)    "ACHING JOINTS"  . Penicillins Rash and Other (See Comments)    Has patient had a PCN reaction causing immediate rash, facial/tongue/throat swelling, SOB or lightheadedness with hypotension: Yes Has patient had a PCN reaction causing severe rash involving mucus membranes or skin necrosis: No Has patient had a PCN reaction that required hospitalization: No Has patient had a PCN reaction occurring within the last 10 years: No If all of the above answers are "NO", then may proceed with Cephalosporin use.   . Tramadol Nausea Only   Patient Measurements: Height: 6\' 1"  (185.4 cm) Weight: 248 lb (112.5 kg) IBW/kg (Calculated) : 79.9 Heparin Dosing Weight: 101 kg  Assessment: 84 y.o male presented to [facility] ED on 05/15/17 with generalized weakness x 2 mos.  H/o Afib, on Coumadin 4mg  daily exc 6mg  on Fri PTA. INR on admit low at 1.37. No missed doses noted.   INR subtherapeutic (1.6) after 3 days of Coumadin 6 mg doses.  Goal of Therapy:  INR 2-3 Monitor platelets by anticoagulation protocol: Yes   Plan:  Coumadin 6 mg  x 1 Monitor daily INR, CBC, s/s of bleed  Angus Seller, PharmD Pharmacy Resident [phone] 05/18/2017 8:16 AM

## 2017-05-19 DIAGNOSIS — R531 Weakness: Secondary | ICD-10-CM

## 2017-05-19 LAB — BASIC METABOLIC PANEL
ANION GAP: 12 (ref 5–15)
BUN: 97 mg/dL — ABNORMAL HIGH (ref 6–20)
CO2: 26 mmol/L (ref 22–32)
Calcium: 9.2 mg/dL (ref 8.9–10.3)
Chloride: 98 mmol/L — ABNORMAL LOW (ref 101–111)
Creatinine, Ser: 2.51 mg/dL — ABNORMAL HIGH (ref 0.61–1.24)
GFR calc Af Amer: 25 mL/min — ABNORMAL LOW (ref >60)
GFR, EST NON AFRICAN AMERICAN: 21 mL/min — AB (ref >60)
Glucose, Bld: 280 mg/dL — ABNORMAL HIGH (ref 65–99)
Potassium: 4.8 mmol/L (ref 3.5–5.1)
SODIUM: 136 mmol/L (ref 135–145)

## 2017-05-19 LAB — CBC
HEMATOCRIT: 29.4 % — AB (ref 39.0–52.0)
HEMOGLOBIN: 8.9 g/dL — AB (ref 13.0–17.0)
MCH: 31 pg (ref 26.0–34.0)
MCHC: 30.3 g/dL (ref 30.0–36.0)
MCV: 102.4 fL — ABNORMAL HIGH (ref 78.0–100.0)
Platelets: 403 10*3/uL — ABNORMAL HIGH (ref 150–400)
RBC: 2.87 MIL/uL — ABNORMAL LOW (ref 4.22–5.81)
RDW: 18.4 % — ABNORMAL HIGH (ref 11.5–15.5)
WBC: 9.4 10*3/uL (ref 4.0–10.5)

## 2017-05-19 LAB — FOLATE RBC: Hematocrit: 27.2 % — ABNORMAL LOW (ref 37.5–51.0)

## 2017-05-19 LAB — PROTIME-INR
INR: 1.84
Prothrombin Time: 21.1 seconds — ABNORMAL HIGH (ref 11.4–15.2)

## 2017-05-19 LAB — GLUCOSE, CAPILLARY
GLUCOSE-CAPILLARY: 365 mg/dL — AB (ref 65–99)
GLUCOSE-CAPILLARY: 431 mg/dL — AB (ref 65–99)
Glucose-Capillary: 398 mg/dL — ABNORMAL HIGH (ref 65–99)
Glucose-Capillary: 274 mg/dL — ABNORMAL HIGH (ref 65–99)
Glucose-Capillary: 384 mg/dL — ABNORMAL HIGH (ref 65–99)

## 2017-05-19 MED ORDER — INSULIN ASPART 100 UNIT/ML ~~LOC~~ SOLN
0.0000 [IU] | Freq: Three times a day (TID) | SUBCUTANEOUS | Status: DC
  Administered 2017-05-19 (×3): 15 [IU] via SUBCUTANEOUS
  Administered 2017-05-20: 8 [IU] via SUBCUTANEOUS
  Administered 2017-05-20: 5 [IU] via SUBCUTANEOUS
  Administered 2017-05-20: 11 [IU] via SUBCUTANEOUS

## 2017-05-19 MED ORDER — WARFARIN SODIUM 6 MG PO TABS
6.0000 mg | ORAL_TABLET | Freq: Once | ORAL | Status: AC
  Administered 2017-05-19: 6 mg via ORAL
  Filled 2017-05-19: qty 1

## 2017-05-19 NOTE — Progress Notes (Addendum)
Family Medicine Teaching Service Daily Progress Note Intern Pager: [phone]  Patient name: Christopher [name] Medical record number: [id number] Date of birth: 1926/10/26 Age: 84 y.o. Gender: male  Primary Care Provider: Prince Solian, MD Consultants: Palliative Code Status: DNR  Pt Overview and Major Events to Date:  3/07: Admitted  3/09: Palliative consulted, recommending outpatient palliative and hospice, PT recommending SNF  Assessment and Plan: Christopher [name] is a 84 y.o. male presenting with generalized weakness. PMH is significant for CAD s/p CABG x 3 in 2003, pacemaker in place, pA-fib on Coumadin, HTN, HFpEF, HLD, CKD V, hx prostate cancer  Generalized weakness secondary to dehydration and azotemia  CKD stage IV: Acute.  Weakness slightly improved though not at baseline per patient.  Followed by Christopher [name] for nephrology.  Patient adamant about not pursuing HD.  Nephrology in agreement for continued Lasix.  ESR elevated at 30 on admission concerning for possible autoimmune or inflammatory reaction.  Currently receiving daily steroids without improvement.  Remains euvolemic with 2.2 Loutput over the last 24 hours. - Neurology consulted, appreciate recommendations- do not pursue further work up  - Holding metolazone, continue Lasix 80 mg twice daily (home dose Lasix 160 mg twice daily) - Relative consulted, appreciate recommendations, plan for outpatient relative hospice - PT consulted, recommending SNF placement - Vitamin B-12- elevated to 1002, Folate RBC pending  HFpEF  CAD status post CABG  PAF: CABG x3 in 2003.  Last echo 55-60%, mild LVH, indeterminate diastolic dysfunction with moderate dilated left atrium.  Patient without chest pain since admission.  Continuing home beta-blocker.  He remains rate controlled.  CHADSVASc 5 on Coumadin at home. - Continue home metoprolol succinate 25 mg daily - Coumadin per pharmacy  Primary hypertension: Chronic.  Remains normotensive  to 136/72 this am. - Continue home metoprolol succinate 25 mg daily  None insulin dependent diabetes mellitus type 2: Chronic.  Uncontrolled, last A1c 8.6.  Not on home insulin. Currently requiring Lantus and sliding scale likely due to daily use of steroids. - Holding home Amaryl 0.5 mg daily - Increase to moderate sliding scale and Lantus 4 units daily while on steroids  Anemia of chronic disease: On Aranesp.  Hemoglobin remained stable at baseline (9-10).  No signs of active bleeding. - Monitoring hemoglobin and signs of active bleeding  History of prostate cancer  Chronic pain: Stable.  No records per chart review. - Palliative and hospice per above - Continue home Norco 5-325 mg every 6 hours as needed for pain  FEN/GI: Heart healthy carb-modified Prophylaxis: Coumadin  Disposition: Awaiting SNF placement, outpatient palliative and hospice.  Subjective:  Patient says he is feeling better today. Son in room and would like to know SW options. Patient denies any worsening SOB or chest pain.   Objective: Temp:  [98 F (36.7 C)-98.6 F (37 C)] 98.3 F (36.8 C) (03/11 0931) Pulse Rate:  [70-77] 70 (03/11 0931) Resp:  [18] 18 (03/11 0931) BP: (131-145)/(64-77) 143/70 (03/11 0931) SpO2:  [97 %-100 %] 100 % (03/11 0931) Physical Exam: General: NAD, pleasant, laying in bed on 3L Eyes: no conjunctival pallor or injection ENTM: Moist mucous membranes Cardiovascular: RRR, no m/r/g, no LE edema Respiratory: CTA BL, normal work of breathing Gastrointestinal: soft, nontender, nondistended, normoactive BS MSK: moves 4 extremities equally Derm: no rashes appreciated Neuro: CN II-XII grossly intact Psych: AOx3, appropriate affect  Laboratory: Recent Labs  Lab 05/15/17 1346 05/16/17 0553 05/19/17 0828  WBC 6.7 4.8 9.4  HGB 9.0* 9.0* 8.9*  HCT 29.6* 30.0* 29.4*  PLT 353 356 403*   Recent Labs  Lab 05/15/17 1346 05/16/17 0553  05/17/17 0455 05/18/17 0451 05/19/17 0828   NA 136 137  --  135 135 136  K 4.7 4.8  --  5.3* 5.5* 4.8  CL 95* 97*  --  96* 98* 98*  CO2 27 28  --  26 25 26   BUN 106* 99*  --  92* 96* 97*  CREATININE 2.93* 2.68*  --  2.50* 2.57* 2.51*  CALCIUM 8.9 9.0  --  9.1 9.2 9.2  PROT 6.6 6.1*  --   --   --   --   BILITOT 1.1 1.1  --   --   --   --   ALKPHOS 58 49  --   --   --   --   ALT 30 28  --   --   --   --   AST 43* 38  --   --   --   --   GLUCOSE 367* 216*   < > 293* 305* 280*   < > = values in this interval not displayed.   3/10 folate: Pending 3/10 B12: 1,002 3/10 INR: 1.60 3/08 CK: WNL 3/08 TSH: WNL 3/08 troponin x3: Plateaued 0.04 3/08 hemoglobin A1c: 8.6 3/08 iron studies: Anemia of chronic disease  Imaging/Diagnostic Tests: DG Chest 2 View (05/15/17): IMPRESSION: Cardiomegaly.  No evidence for acute  abnormality.   Christopher [name], [place], DO 05/19/2017, 10:06 AM PGY-1, [place] Intern pager: [phone], text pages welcome

## 2017-05-19 NOTE — Progress Notes (Signed)
[facility] for Coumadin Indication: h/o atrial fibrillation  Allergies  Allergen Reactions  . Crestor [Rosuvastatin] Other (See Comments)    "ACHING JOINTS"  . Lipitor [Atorvastatin] Other (See Comments)    "ACHING JOINTS"  . Statins Other (See Comments)    "ACHING JOINTS"  . Penicillins Rash and Other (See Comments)    Has patient had a PCN reaction causing immediate rash, facial/tongue/throat swelling, SOB or lightheadedness with hypotension: Yes Has patient had a PCN reaction causing severe rash involving mucus membranes or skin necrosis: No Has patient had a PCN reaction that required hospitalization: No Has patient had a PCN reaction occurring within the last 10 years: No If all of the above answers are "NO", then may proceed with Cephalosporin use.   . Tramadol Nausea Only   Patient Measurements: Height: 6\' 1"  (185.4 cm) Weight: 248 lb (112.5 kg) IBW/kg (Calculated) : 79.9  Assessment: 84 y.o male presented to [facility] ED on 05/15/17 with generalized weakness x 2 mos.  H/o Afib, on Coumadin 4mg  daily exccept 6mg  on Fridays. INR on admit low at 1.37. No missed doses noted.   Patient has been getting warfarin 6mg  daily since admission. INR slowly trending up - up to 1.84 today. Hgb 8.9, plts 403- no bleeding noted.  Patient may be discharged to SNF soon with palliative care to follow.  Goal of Therapy:  INR 2-3 Monitor platelets by anticoagulation protocol: Yes   Plan:  Warfarin 6 mg  x 1 tonight (ordered), recommend warfarin 6mg  daily with an INR check on Thursday or Friday at SNF if discharged soon Daily INR while here  Yekaterina Escutia D. Dorcus Riga, PharmD, BCPS Clinical Pharmacist Clinical Phone for 05/19/2017 until 3:30pm: [phone] If after 3:30pm, please call main pharmacy at [phone] 05/19/2017 10:51 AM

## 2017-05-19 NOTE — Care Management Important Message (Signed)
Important Message  Patient Details  Name: Christopher [name] MRN: [id number] Date of Birth: 1926-05-06   Medicare Important Message Given:  Yes    [name] 05/19/2017, 12:58 PM

## 2017-05-19 NOTE — Progress Notes (Signed)
[facility] Rounding Note  Background: 89M PMH HTN, HLD, CAD s/p CABG, Afib, anemia, and CKD V seen in consultation at the request of Dr. Chambliss for eval and recs re: progressive CKD. Adm w/progresive weakness, unable to get OOB. At last visit w/ Dr. Goldsborough  expressed would not pursue dialysis. (Had initially planning to, after 3 failed accesses decided against) Last Cr 2.8 and BUN 88 at [facility] 10/2016. Admit BUN/creatiine 106/2.9  Assessment/Plan 1. Progressive CKD:  Adm BUN 106/Cr 2.9, some progression past months. Adamant about no dialysis. Lasix/metolazone held on adm, slow IVF for 12 hours, ^SOB so stopped. Lasix resumed 80 BID.  Labs better, creatinine down some, breathing more comfortably, but otherwise says not feeling any better  2. Anemia:  Gets aranesp 100 mcg q 4 weeks- last dose 04/18/2017. Redosed 3/8.  Would not necessarily need to be continued as OP 3. Chronic dCHF: Admission CXR no edema. Exp wheezing with IVF's - stopped/lower dose of lasix restsarted - comfortable on O2 today  4. Weakness: Uremia + frailty + debility + deconditioning. OT/PT evaluating. Getting steroid burst by primary team for mild ESR elev.  5. Disposition - Palliative Care has seen. Pt is DNR. SNF. Getting empiric steroids no help yet. Plan to discharge with hospice to SNF, soon  I have no issue with discharge to SNF  GOLDSBOROUGH,KELLIE A 05/17/2017, 2:08 PM  Subjective/Interval History:  Seems like he is at peace  Not c/o anything at present   Objective Vital signs in last 24 hours: Vitals:   05/18/17 1618 05/18/17 2135 05/19/17 0550 05/19/17 0931  BP: 131/64 (!) 145/77 136/72 (!) 143/70  Pulse: 77 72 73 70  Resp: 18 18 18 18  Temp: 98.5 F (36.9 C) 98.6 F (37 C) 98 F (36.7 C) 98.3 F (36.8 C)  TempSrc: Oral Oral Oral Oral  SpO2: 97% 99% 99% 100%  Weight:      Height:       Weight change:   Intake/Output Summary (Last 24 hours) at 05/19/2017 1149 Last data filed at 05/19/2017 0900 Gross  per 24 hour  Intake 720 ml  Output 2950 ml  Net -2230 ml   Physical Exam:  Blood pressure (!) 143/70, pulse 70, temperature 98.3 F (36.8 C), temperature source Oral, resp. rate 18, height 6' 1" (1.854 m), weight 112.5 kg (248 lb), SpO2 100 %.  Pale very nice older WM VS as noted On O2 and appears comfortable (unless he moves) Lungs clear S1S2 No S3 Abd soft Trace LE edema  Recent Labs  Lab 05/15/17 1346 05/16/17 0553 05/16/17 2234 05/17/17 0455 05/18/17 0451 05/19/17 0828  NA 136 137  --  135 135 136  K 4.7 4.8  --  5.3* 5.5* 4.8  CL 95* 97*  --  96* 98* 98*  CO2 27 28  --  26 25 26  GLUCOSE 367* 216* 416* 293* 305* 280*  BUN 106* 99*  --  92* 96* 97*  CREATININE 2.93* 2.68*  --  2.50* 2.57* 2.51*  CALCIUM 8.9 9.0  --  9.1 9.2 9.2  PHOS  --   --   --  4.7* 6.0*  --     Recent Labs  Lab 05/15/17 1346 05/16/17 0553 05/17/17 0455 05/18/17 0451  AST 43* 38  --   --   ALT 30 28  --   --   ALKPHOS 58 49  --   --   BILITOT 1.1 1.1  --   --   PROT   6.6 6.1*  --   --   ALBUMIN 2.9* 3.0* 3.0* 3.1*    Recent Labs  Lab 05/15/17 1346 05/16/17 0553 05/19/17 0828  WBC 6.7 4.8 9.4  HGB 9.0* 9.0* 8.9*  HCT 29.6* 30.0* 29.4*  MCV 102.1* 102.7* 102.4*  PLT 353 356 403*    Recent Labs  Lab 05/15/17 1346 05/15/17 2107 05/16/17 0553  CKTOTAL  --   --  39*  TROPONINI 0.04* 0.03* 0.04*   CBG: Recent Labs  Lab 05/18/17 1137 05/18/17 1624 05/18/17 2129 05/19/17 0104 05/19/17 0748  GLUCAP 247* 441* 457* 398* 274*    Dg Chest 2 View Result Date: 05/15/2017 CLINICAL DATA:  Patient experiencing SOB for 2-3 months. Increasing weakness and aches for 1 year. Chest pain that comes and goes. HTN takes medication. Diabetic takes medication. Hx of A-fib, CAD, cardiac pacemaker, CHF. EXAM: CHEST - 2 VIEW COMPARISON:  06/10/2016 FINDINGS: Status post median sternotomy and CABG. Patient's LEFT-sided transvenous pacemaker leads to the RIGHT atrium and RIGHT ventricle. Shallow  lung inflation. The heart is enlarged. There is mild streaky atelectasis at the lung bases. No consolidations or evidence for pulmonary edema. IMPRESSION: Cardiomegaly.  No evidence for acute  abnormality. Electronically Signed   By: Nolon Nations M.D.   On: 05/15/2017 14:55   Medications:  . darbepoetin (ARANESP) injection - NON-DIALYSIS  100 mcg Subcutaneous Q Fri-1800  . ezetimibe  10 mg Oral Daily  . furosemide  80 mg Oral BID  . insulin aspart  0-15 Units Subcutaneous TID WC  . insulin glargine  4 Units Subcutaneous QHS  . metoprolol succinate  25 mg Oral Daily  . polyethylene glycol  17 g Oral Daily  . warfarin  6 mg Oral ONCE-1800  . Warfarin - Pharmacist Dosing Inpatient   Does not apply [phone]

## 2017-05-19 NOTE — Progress Notes (Signed)
Physical Therapy Treatment Patient Details Name: Christopher [name] MRN: [id number] DOB: 04-11-1926 Today's Date: 05/19/2017    History of Present Illness Pt is an 84 y/o male admitted secondary to Azotemia. PMH includes CAD s/p CABG, SSS s/p pacemaker, a fib, CHF, COPD on 3L of oxygen, DM, CKD4, HTN, and prostate cancer.     PT Comments    Patient progressing this session with multiple stands straight up with assist, but remains limited with tolerance due to foot/LE pain and fatigue.  Still appropriate for SNF level rehab at d/c.  PT to follow acutely.   Follow Up Recommendations  SNF;Supervision/Assistance - 24 hour     Equipment Recommendations  None recommended by PT    Recommendations for Other Services       Precautions / Restrictions Precautions Precautions: Fall    Mobility  Bed Mobility Overal bed mobility: Needs Assistance Bed Mobility: Rolling;Sidelying to Sit;Sit to Supine Rolling: Mod assist Sidelying to sit: Mod assist       General bed mobility comments: used bed rail and cues for bending opposite leg to allow to roll to remove bedpan, side to sit with UE pushing on rail and mod A for lifting trunk  Transfers Overall transfer level: Needs assistance   Transfers: Sit to/from Stand Sit to Stand: Mod assist;From elevated surface         General transfer comment: elevated height of bed and assist for lifting from EOB pulling up on Denna Haggard; repeated x 4 trials from EOB and seat on Denna Haggard  Ambulation/Gait             General Gait Details: unable to attempt    Stairs            Wheelchair Mobility    Modified Rankin (Stroke Patients Only)       Balance Overall balance assessment: Needs assistance Sitting-balance support: Bilateral upper extremity supported;Feet supported;No upper extremity supported Sitting balance-Leahy Scale: Fair Sitting balance - Comments: progressing from needing UE support, to none intermittently   Standing  balance support: Bilateral upper extremity supported Standing balance-Leahy Scale: Poor Standing balance comment: relies on BIL UE and fatigues quickly, after about 10 seconds                            Cognition Arousal/Alertness: Awake/alert Behavior During Therapy: WFL for tasks assessed/performed Overall Cognitive Status: Within Functional Limits for tasks assessed                                        Exercises General Exercises - Lower Extremity Ankle Circles/Pumps: AROM;20 reps;Both;Supine Short Arc Quad: AROM Long Arc Quad: AROM;Both;5 reps;Seated Heel Slides: AAROM;Both;5 reps;Supine Toe Raises: AROM;5 reps;Both;Supine Heel Raises: AROM;5 reps;Both;Supine    General Comments General comments (skin integrity, edema, etc.): wife and son in room, son assisted with removal of bed pan      Pertinent Vitals/Pain Pain Assessment: 0-10 Pain Score: 2  Pain Location: feet and legs  Pain Descriptors / Indicators: Aching Pain Intervention(s): Repositioned;Monitored during session    Home Living                      Prior Function            PT Goals (current goals can now be found in the care plan section) Progress towards PT goals:  Progressing toward goals    Frequency    Min 2X/week      PT Plan Current plan remains appropriate    Co-evaluation              AM-PAC PT "6 Clicks" Daily Activity  Outcome Measure  Difficulty turning over in bed (including adjusting bedclothes, sheets and blankets)?: Unable Difficulty moving from lying on back to sitting on the side of the bed? : Unable Difficulty sitting down on and standing up from a chair with arms (e.g., wheelchair, bedside commode, etc,.)?: Unable Help needed moving to and from a bed to chair (including a wheelchair)?: Total Help needed walking in hospital room?: Total Help needed climbing 3-5 steps with a railing? : Total 6 Click Score: 6    End of Session  Equipment Utilized During Treatment: Gait belt;Oxygen Activity Tolerance: Patient tolerated treatment well Patient left: in bed;with family/visitor present;with call bell/phone within reach   PT Visit Diagnosis: Unsteadiness on feet (R26.81);Muscle weakness (generalized) (M62.81);Other abnormalities of gait and mobility (R26.89);Difficulty in walking, not elsewhere classified (R26.2)     Time: 7741-4239 PT Time Calculation (min) (ACUTE ONLY): 28 min  Charges:  $Therapeutic Exercise: 8-22 mins $Therapeutic Activity: 8-22 mins                    G CodesMagda Kiel, [place] [phone] 05/19/2017    Reginia Naas 05/19/2017, 1:58 PM

## 2017-05-19 NOTE — Plan of Care (Signed)
  Progressing Education: Knowledge of disease and its progression will improve 05/19/2017 1056 - Progressing by Harlin Heys, [place] Behavior/Discharge Planning: Ability to manage health-related needs will improve 05/19/2017 1056 - Progressing by Harlin Heys, RN Clinical Measurements: Complications related to the disease process or treatment will be avoided or minimized 05/19/2017 1056 - Progressing by Harlin Heys, RN Dialysis access will remain free of complications 7/32/2025 4270 - Progressing by Harlin Heys, RN Fluid Volume: Fluid volume balance will be maintained or improved 05/19/2017 1056 - Progressing by Harlin Heys, RN Nutritional: Ability to make appropriate dietary choices will improve 05/19/2017 1056 - Progressing by Harlin Heys, RN Respiratory: Respiratory symptoms related to disease process will be avoided 05/19/2017 1056 - Progressing by Harlin Heys, RN Self-Concept: Body image disturbance will be avoided or minimized 05/19/2017 1056 - Progressing by Harlin Heys, RN Urinary Elimination: Progression of disease will be identified and treated 05/19/2017 1056 - Progressing by Harlin Heys, RN Education: Knowledge of General Education information will improve 05/19/2017 1056 - Progressing by Harlin Heys, [place] Behavior/Discharge Planning: Ability to manage health-related needs will improve 05/19/2017 1056 - Progressing by Harlin Heys, RN Activity: Risk for activity intolerance will decrease 05/19/2017 1056 - Progressing by Harlin Heys, RN Nutrition: Adequate nutrition will be maintained 05/19/2017 1056 - Progressing by Harlin Heys, RN Coping: Level of anxiety will decrease 05/19/2017 1056 - Progressing by Harlin Heys, RN Elimination: Will not experience complications related to bowel motility 05/19/2017 1056 - Progressing by Harlin Heys, RN Will not experience complications related to urinary retention 05/19/2017  1056 - Progressing by Harlin Heys, RN Skin Integrity: Risk for impaired skin integrity will decrease 05/19/2017 1056 - Progressing by Harlin Heys, RN

## 2017-05-19 NOTE — Clinical Social Work Note (Signed)
CSW talked with patient, his wife and son Quita Skye and provided them with facility responses. Follow-up will be made with family on 3/12 to determine facility choice.  Patricie Geeslin Givens, MSW, LCSW Licensed Clinical Social Worker [place] [phone]

## 2017-05-20 DIAGNOSIS — R531 Weakness: Secondary | ICD-10-CM

## 2017-05-20 LAB — CBC
HCT: 29.4 % — ABNORMAL LOW (ref 39.0–52.0)
Hemoglobin: 8.9 g/dL — ABNORMAL LOW (ref 13.0–17.0)
MCH: 30.8 pg (ref 26.0–34.0)
MCHC: 30.3 g/dL (ref 30.0–36.0)
MCV: 101.7 fL — ABNORMAL HIGH (ref 78.0–100.0)
PLATELETS: 432 10*3/uL — AB (ref 150–400)
RBC: 2.89 MIL/uL — ABNORMAL LOW (ref 4.22–5.81)
RDW: 18.2 % — AB (ref 11.5–15.5)
WBC: 9.9 10*3/uL (ref 4.0–10.5)

## 2017-05-20 LAB — GLUCOSE, CAPILLARY
GLUCOSE-CAPILLARY: 242 mg/dL — AB (ref 65–99)
Glucose-Capillary: 343 mg/dL — ABNORMAL HIGH (ref 65–99)
Glucose-Capillary: 269 mg/dL — ABNORMAL HIGH (ref 65–99)
Glucose-Capillary: 358 mg/dL — ABNORMAL HIGH (ref 65–99)

## 2017-05-20 LAB — PROTIME-INR
INR: 2.14
PROTHROMBIN TIME: 23.7 s — AB (ref 11.4–15.2)

## 2017-05-20 LAB — BASIC METABOLIC PANEL
ANION GAP: 11 (ref 5–15)
BUN: 101 mg/dL — AB (ref 6–20)
CALCIUM: 9 mg/dL (ref 8.9–10.3)
CO2: 29 mmol/L (ref 22–32)
CREATININE: 2.59 mg/dL — AB (ref 0.61–1.24)
Chloride: 97 mmol/L — ABNORMAL LOW (ref 101–111)
GFR calc Af Amer: 24 mL/min — ABNORMAL LOW (ref >60)
GFR calc non Af Amer: 21 mL/min — ABNORMAL LOW (ref >60)
GLUCOSE: 282 mg/dL — AB (ref 65–99)
Potassium: 4.8 mmol/L (ref 3.5–5.1)
Sodium: 137 mmol/L (ref 135–145)

## 2017-05-20 MED ORDER — WARFARIN SODIUM 6 MG PO TABS
6.0000 mg | ORAL_TABLET | Freq: Once | ORAL | Status: AC
  Administered 2017-05-20: 6 mg via ORAL
  Filled 2017-05-20: qty 1

## 2017-05-20 NOTE — Progress Notes (Signed)
Family Medicine Teaching Service Daily Progress Note Intern Pager: [phone]  Patient name: Christopher [name] Medical record number: [id number] Date of birth: Jun 23, 1926 Age: 84 y.o. Gender: male  Primary Care Provider: Prince Solian, MD Consultants: Palliative Code Status: DNR  Pt Overview and Major Events to Date:  3/07: Admitted  3/09: Palliative consulted, recommending outpatient palliative and hospice, PT recommending SNF  Assessment and Plan: Christopher [name] is a 84 y.o. male presenting with generalized weakness. PMH is significant for CAD s/p CABG x 3 in 2003, pacemaker in place, pA-fib on Coumadin, HTN, HFpEF, HLD, CKD V, hx prostate cancer  Generalized weakness secondary to dehydration and azotemia  CKD stage IV: Acute. Weakness slightly improved though not at baseline per patient. Followed by Dr. Moshe Cipro for nephrology.  Patient adamant about not pursuing HD.  Nephrology in agreement for continued Lasix. Remains euvolemic with 3.2 L output over the last 24 hours. Weight 247 lb today, unsure of baseline weight. - Holding metolazone, continue Lasix 80 mg twice daily (home dose Lasix 160 mg twice daily) - Palliative consulted, appreciate recommendations - PT consulted, recommending SNF placement  HFpEF  CAD status post CABG  PAF: CABG x3 in 2003.  Last echo 55-60%, mild LVH, indeterminate diastolic dysfunction with moderate dilated left atrium.  Patient without chest pain since admission.  Continuing home beta-blocker.  He remains rate controlled.  CHADSVASc 5 on Coumadin at home. - Continue home metoprolol succinate 25 mg daily - Coumadin per pharmacy  Primary hypertension: Chronic.  Remains normotensive to 120/62 this am. - Continue home metoprolol succinate 25 mg daily  None insulin dependent diabetes mellitus type 2: Chronic.  Uncontrolled, last A1c 8.6.  Currently requiring Lantus and sliding scale likely due to daily use of steroids. - Holding home Amaryl 0.5 mg  daily - Increase to moderate sliding scale and Lantus 4 units daily, and monitor closely since steroids d/c yesterday  Anemia of chronic disease: On Aranesp.  Hemoglobin remained stable at baseline (9-10).  No signs of active bleeding. - Monitoring hemoglobin and signs of active bleeding  History of prostate cancer  Chronic pain: Stable.  No records per chart review. - Palliative and hospice per above - Continue home Norco 5-325 mg every 6 hours as needed for pain  FEN/GI: Heart healthy carb-modified Prophylaxis: Coumadin  Disposition: Awaiting SNF placement  Subjective:  Patient reports that he is feeling about the same today. Family is to find placement for him.   Objective: Temp:  [98 F (36.7 C)-98.7 F (37.1 C)] 98.1 F (36.7 C) (03/12 0844) Pulse Rate:  [67-99] 76 (03/12 1000) Resp:  [18-19] 18 (03/12 0844) BP: (106-140)/(58-78) 120/62 (03/12 1000) SpO2:  [98 %-100 %] 98 % (03/12 0844) Weight:  [247 lb 9.2 oz (112.3 kg)] 247 lb 9.2 oz (112.3 kg) (03/11 2113) Physical Exam: General: NAD, pleasant Eyes: PERRL, EOMI, no conjunctival pallor or injection ENTM: Moist mucous membranes Cardiovascular: RRR, no m/r/g, no LE edema Respiratory: CTA BL, normal work of breathing Gastrointestinal: soft, nontender, nondistended MSK: moves 4 extremities equally Derm: no rashes appreciated Neuro: CN II-XII grossly intact Psych: Appropriate affect  Laboratory: Recent Labs  Lab 05/16/17 0553 05/18/17 0451 05/19/17 0828 05/20/17 0356  WBC 4.8  --  9.4 9.9  HGB 9.0*  --  8.9* 8.9*  HCT 30.0* 27.2* 29.4* 29.4*  PLT 356  --  403* 432*   Recent Labs  Lab 05/15/17 1346 05/16/17 0553  05/18/17 0451 05/19/17 0828 05/20/17 0356  NA 136 137   < >  135 136 137  K 4.7 4.8   < > 5.5* 4.8 4.8  CL 95* 97*   < > 98* 98* 97*  CO2 27 28   < > 25 26 29   BUN 106* 99*   < > 96* 97* 101*  CREATININE 2.93* 2.68*   < > 2.57* 2.51* 2.59*  CALCIUM 8.9 9.0   < > 9.2 9.2 9.0  PROT 6.6 6.1*   --   --   --   --   BILITOT 1.1 1.1  --   --   --   --   ALKPHOS 58 49  --   --   --   --   ALT 30 28  --   --   --   --   AST 43* 38  --   --   --   --   GLUCOSE 367* 216*   < > 305* 280* 282*   < > = values in this interval not displayed.   3/10 folate: 620, 2279 wnl 3/10 B12: 1,002 wnl 3/10 INR: 1.60 3/08 CK: WNL 3/08 TSH: WNL 3/08 troponin x3: Plateaued 0.04 3/08 hemoglobin A1c: 8.6 3/08 iron studies: Anemia of chronic disease  Imaging/Diagnostic Tests: DG Chest 2 View (05/15/17): IMPRESSION: Cardiomegaly.  No evidence for acute  abnormality.   Alphus Zeck, [place], DO 05/20/2017, 1:37 PM PGY-1, [place] Intern pager: [phone], text pages welcome

## 2017-05-20 NOTE — Progress Notes (Signed)
Occupational Therapy Treatment Patient Details Name: Christopher [name] MRN: [id number] DOB: 03/25/1926 Today's Date: 05/20/2017    History of present illness Pt is an 84 y/o male admitted secondary to Azotemia. PMH includes CAD s/p CABG, SSS s/p pacemaker, a fib, CHF, COPD on 3L of oxygen, DM, CKD4, HTN, and prostate cancer.    OT comments  Pt progressing towards OT goals, completing bed mobility and EOB UB/LB exercise this session with supervision for static sitting EOB. Pt completed sit<>stand x1 using Stedy with ModA, though limited due to increased pain in LLE with attempts to stand (increased difficulty achieving full standing posture). Pt with decreased activity tolerance requiring rest breaks throughout session after brief activity. Feel SNF recommendation remains appropriate at this time. Will continue to follow acutely to progress pt towards established OT goals.    Follow Up Recommendations  SNF    Equipment Recommendations  Hospital bed;Wheelchair cushion (measurements OT);Wheelchair (measurements OT);3 in 1 bedside commode          Precautions / Restrictions Precautions Precautions: Fall Restrictions Weight Bearing Restrictions: No       Mobility Bed Mobility Overal bed mobility: Needs Assistance Bed Mobility: Supine to Sit;Sit to Supine     Supine to sit: Max assist Sit to supine: Max assist   General bed mobility comments: pt able to advance LEs towards EOB with assist to scoot hips; assist to support trunk into upright sitting position with verbal cues for use of rail to assist; assist to guide trunk and LEs when returning to supine   Transfers Overall transfer level: Needs assistance Equipment used: Ambulation equipment used Transfers: Sit to/from Stand Sit to Stand: Mod assist;From elevated surface         General transfer comment: elevated height of bed and assist for lifting from EOB pulling up on Denna Haggard    Balance Overall balance assessment: Needs  assistance Sitting-balance support: Bilateral upper extremity supported;Feet supported;No upper extremity supported Sitting balance-Leahy Scale: Good     Standing balance support: Bilateral upper extremity supported Standing balance-Leahy Scale: Poor Standing balance comment: relies on BIL UE and external support from therapist                            ADL either performed or assessed with clinical judgement   ADL Overall ADL's : Needs assistance/impaired                                     Functional mobility during ADLs: Moderate assistance(for sit<>sand ) General ADL Comments: pt reports having completed ADLs prior to session with spouse assist; focus of session on EOB activity with pt maintaining static sitting EOB majority of session with supervision, completing UB/LB exercises; pt agreeable to OOB to chair however when attempting sit<>stand at [facility] Pt with significant pain in LLE, able to clear buttocks though unable to achieve full standing and maintain wt bearing through LLE, therefore returned to sitting EOB                        Cognition Arousal/Alertness: Awake/alert Behavior During Therapy: WFL for tasks assessed/performed Overall Cognitive Status: Within Functional Limits for tasks assessed  Exercises General Exercises - Upper Extremity Shoulder Flexion: AROM;5 reps;Right;Left;Seated Shoulder Horizontal ABduction: AROM;10 reps;Right;Left;Seated Shoulder Horizontal ADduction: AROM;10 reps;Right;Left;Seated Elbow Flexion: AROM;10 reps;Both;Seated Elbow Extension: AROM;10 reps;Both;Seated General Exercises - Lower Extremity Hip Flexion/Marching: AROM;10 reps;Left;Right;Seated                Pertinent Vitals/ Pain       Pain Assessment: Faces Faces Pain Scale: Hurts whole lot Pain Location: L knee  Pain Descriptors / Indicators: Grimacing;Sore;Tender Pain  Intervention(s): Monitored during session;Repositioned;Ice applied                                                          Frequency  Min 2X/week        Progress Toward Goals  OT Goals(current goals can now be found in the care plan section)  Progress towards OT goals: Progressing toward goals  Acute Rehab OT Goals Patient Stated Goal: to return home  OT Goal Formulation: With patient/family Time For Goal Achievement: 05/30/17 Potential to Achieve Goals: Good  Plan Discharge plan remains appropriate                    AM-PAC PT "6 Clicks" Daily Activity     Outcome Measure   Help from another person eating meals?: A Little Help from another person taking care of personal grooming?: A Little Help from another person toileting, which includes using toliet, bedpan, or urinal?: Total Help from another person bathing (including washing, rinsing, drying)?: A Lot Help from another person to put on and taking off regular upper body clothing?: A Lot Help from another person to put on and taking off regular lower body clothing?: Total 6 Click Score: 12    End of Session Equipment Utilized During Treatment: Gait belt  OT Visit Diagnosis: Unsteadiness on feet (R26.81);Muscle weakness (generalized) (M62.81)   Activity Tolerance Patient tolerated treatment well;Patient limited by pain   Patient Left in bed;with call bell/phone within reach;with family/visitor present   Nurse Communication Mobility status        Time: 0017-4944 OT Time Calculation (min): 40 min  Charges: OT General Charges $OT Visit: 1 Visit OT Treatments $Therapeutic Activity: 23-37 mins  Lou Cal, [place] Pager [phone] 05/20/2017    Raymondo Band 05/20/2017, 4:45 PM

## 2017-05-20 NOTE — Clinical Social Work Note (Addendum)
Patient's family visited [facility] this morning for potential placement of patient for rehab and was advised that no beds available. [facility] admissions director Narda Rutherford also notified CSW of no bed availability by text. CSW visited room to discuss discharge/facility selection and discussion ensued. CSW later talked with  MD regarding patient's discharge.  CSW advised later today by SW Director, [facility] of her  conversations with Dr. Enid Derry and later patient and family at the bedside. Patient's discharge will be facilitated by CSW [employer]. Patient's nurse and unit director informed.  Copper Basnett Givens, MSW, LCSW Licensed Clinical Social Worker [place] [phone]

## 2017-05-20 NOTE — Consult Note (Signed)
[facility] CM Primary Care Navigator  05/20/2017  Christopher [name] [birth year] [id number]   Met with patient, wife Christopher [name]) and family at the bedside to identify possible discharge needs. Patient reportshaving "shortness of breath, generalized weakness - not able to get up or walk and pain all over" which had resulted tothis admission.  Patient endorses Dr. Prince Solian with [facility] as his primary care provider.   [facility] [address] [facility] [address] [facility] without difficulty.  He reports thatwife assists him in managing his medications at home with use of "pill box"system filled once a week.  Patient verbalized that wife or son-in law Christopher [name]) have been providing transportation to hisdoctors' appointments.   Patient's wife is the primary caregiver at home. His step-daughters will provide assistance with care if needed per patient.  Anticipated discharge plan is skilled nursing facility per therapy recommendation (SNF- in process).  Patient and wife voiced understanding to call primary care provider's office upon discharge from rehab facility to set-up a follow-up appointment with in 1- 2 weeks or earlier when needed. Patient letter (with PCP's contact number) was provided as their reminder.  Discussed with patient and familyregarding [facility] CM services available for health managementwhen discharged.Patientindicated interest with it.  Patient and wife expressedunderstanding to seekreferral from primary care provider to [facility] care management ifdeemed necessary and appropriatefor any services in the future- once discharged home.  [facility] care management information provided for future needs that he may have.  Inpatient social worker notified of patient/ family's concern regarding skilled nursing facility needs.   For additional questions please contact:  Edwena Felty A. Pasquale Matters, BSN, RN-BC [facility]  PRIMARY CARE Navigator Cell: [phone]

## 2017-05-20 NOTE — Progress Notes (Signed)
[place] for Coumadin Indication: h/o atrial fibrillation  Allergies  Allergen Reactions  . Crestor [Rosuvastatin] Other (See Comments)    "ACHING JOINTS"  . Lipitor [Atorvastatin] Other (See Comments)    "ACHING JOINTS"  . Statins Other (See Comments)    "ACHING JOINTS"  . Penicillins Rash and Other (See Comments)    Has patient had a PCN reaction causing immediate rash, facial/tongue/throat swelling, SOB or lightheadedness with hypotension: Yes Has patient had a PCN reaction causing severe rash involving mucus membranes or skin necrosis: No Has patient had a PCN reaction that required hospitalization: No Has patient had a PCN reaction occurring within the last 10 years: No If all of the above answers are "NO", then may proceed with Cephalosporin use.   . Tramadol Nausea Only   Patient Measurements: Height: 6\' 1"  (185.4 cm) Weight: 247 lb 9.2 oz (112.3 kg) IBW/kg (Calculated) : 79.9  Assessment: 84 y.o male presented to [facility] ED on 05/15/17 with generalized weakness x 2 mos.  H/o Afib, on warfarin 4mg  daily exccept 6mg  on Fridays. INR on admit low at 1.37.  Patient has been getting warfarin 6mg  daily since admission. INR now therapeutic at 2.1 this morning.  Hgb 8.9, plts 403- no bleeding noted.  Goal of Therapy:  INR 2-3 Monitor platelets by anticoagulation protocol: Yes   Plan:  Warfarin 6 mg  x 1 tonight Daily INR while here  Recommend warfarin 6mg  daily with an INR check on Thursday or Friday at [facility]   Paislyn Domenico D. Oneita Allmon, PharmD, BCPS Clinical Pharmacist Clinical Phone for 05/20/2017 until 3:30pm: [phone] If after 3:30pm, please call main pharmacy at [phone] 05/20/2017 8:51 AM

## 2017-05-20 NOTE — Progress Notes (Addendum)
FPTS Interim Progress Note Patient seen this afternoon after speaking with Lorriane Shire with social work. Family and patient in room upset after speaking with social work as they were told they would be sent out of the hospital today. Family worried because patient would not be able to go home. Patient reassured that he is medically stable to be discharged to a safe place, and it is not safe at home, so he will not be forced to go home. Family went to [facility] today and bed was no longer available.  Patient and family working to try to find another SNF for him to be safely discharged.   Cera Rorke, [place], DO 05/20/2017, 3:11 PM PGY-1, [place] Medicine Service pager [phone]

## 2017-05-20 NOTE — Progress Notes (Signed)
[facility] Rounding Note  Background: 9M PMH HTN, HLD, CAD s/p CABG, Afib, anemia, and CKD V seen in consultation at the request of Dr. Erin Hearing for eval and recs re: progressive CKD. Adm w/progresive weakness, unable to get OOB. At last visit w/ Dr. Moshe Cipro  expressed would not pursue dialysis. (Had initially planning to, after 3 failed accesses decided against) Last Cr 2.8 and BUN 88 at [facility] 10/2016. Admit BUN/creatiine 106/2.9  Assessment/Plan 1. Progressive CKD:  Adm BUN 106/Cr 2.9, some progression past months. Adamant about no dialysis. Lasix/metolazone held on adm, slow IVF for 12 hours, ^SOB so stopped. Lasix resumed 80 BID.  Labs stable, creatinine up some, breathing more comfortably 2. Anemia:  Gets aranesp 100 mcg q 4 weeks- last dose 04/18/2017. Redosed 3/8.  Would not necessarily need to be continued as OP 3. Chronic dCHF: Admission CXR no edema. Exp wheezing with IVF's - stopped/lower dose of lasix restsarted - comfortable on O2 today  4. Weakness: Uremia + frailty + debility + deconditioning. OT/PT evaluating. Getting steroid burst by primary team for mild ESR elev.  5. Disposition - Palliative Care has seen. Pt is DNR. SNF. Getting empiric steroids no help yet. Plan to discharge with hospice to SNF, soon.  Would consider stopping labs, just treat sxms   I have no issue with discharge to SNF.  Would keep lasix at 80 BID, continuing with ESA is not likely to impact positively in QOL so would not continue  Christopher [name] A 05/17/2017, 2:08 PM  Subjective/Interval History:  Seems like he is at peace  Not c/o anything at present May go to [facility] today    Objective Vital signs in last 24 hours: Vitals:   05/19/17 2113 05/20/17 0432 05/20/17 0844 05/20/17 1000  BP: 126/60 (!) 136/58 (!) 106/58 120/62  Pulse: 67 69 99 76  Resp: _0 Temp: 98.5 F (36.9 C) 98.7 F (37.1 C) 98.1 F (36.7 C)   TempSrc: Oral Oral Oral   SpO2: 98% 98% 98%   Weight: 112.3 kg (247 lb  9.2 oz)     Height:       Weight change:   Intake/Output Summary (Last 24 hours) at 05/20/2017 1116 Last data filed at 05/20/2017 0845 Gross per 24 hour  Intake 480 ml  Output 3075 ml  Net -2595 ml   Physical Exam:  Blood pressure 120/62, pulse 76, temperature 98.1 F (36.7 C), temperature source Oral, resp. rate 18, height _1  (1.854 m), weight 112.3 kg (247 lb 9.2 oz), SpO2 98 %.  Pale very nice older WM VS as noted On O2 and appears comfortable (unless he moves) Lungs clear S1S2 No S3 Abd soft Trace LE edema  Recent Labs  Lab 05/15/17 1346 05/16/17 0553 05/16/17 2234 05/17/17 0455 05/18/17 0451 05/19/17 0828 05/20/17 0356  NA 136 137  --  135 135 136 137  K 4.7 4.8  --  5.3* 5.5* 4.8 4.8  CL 95* 97*  --  96* 98* 98* 97*  CO2 27 28  --  _2 GLUCOSE 367* 216* 416* 293* 305* 280* 282*  BUN 106* 99*  --  92* 96* 97* 101*  CREATININE 2.93* 2.68*  --  2.50* 2.57* 2.51* 2.59*  CALCIUM 8.9 9.0  --  9.1 9.2 9.2 9.0  PHOS  --   --   --  4.7* 6.0*  --   --     Recent Labs  Lab 05/15/17 1346 05/16/17 0553 05/17/17  0455 05/18/17 0451  AST 43* 38  --   --   ALT 30 28  --   --   ALKPHOS 58 49  --   --   BILITOT 1.1 1.1  --   --   PROT 6.6 6.1*  --   --   ALBUMIN 2.9* 3.0* 3.0* 3.1*    Recent Labs  Lab 05/15/17 1346 05/16/17 0553 05/18/17 0451 05/19/17 0828 05/20/17 0356  WBC 6.7 4.8  --  9.4 9.9  HGB 9.0* 9.0*  --  8.9* 8.9*  HCT 29.6* 30.0* 27.2* 29.4* 29.4*  MCV 102.1* 102.7*  --  102.4* 101.7*  PLT 353 356  --  403* 432*    Recent Labs  Lab 05/15/17 1346 05/15/17 2107 05/16/17 0553  CKTOTAL  --   --  39*  TROPONINI 0.04* 0.03* 0.04*   CBG: Recent Labs  Lab 05/19/17 0748 05/19/17 1149 05/19/17 1650 05/19/17 2112 05/20/17 0752  GLUCAP 274* 384* 365* 431* 343*    Dg Chest 2 View Result Date: 05/15/2017 CLINICAL DATA:  Patient experiencing SOB for 2-3 months. Increasing weakness and aches for 1 year. Chest pain that comes and goes.  HTN takes medication. Diabetic takes medication. Hx of A-fib, CAD, cardiac pacemaker, CHF. EXAM: CHEST - 2 VIEW COMPARISON:  06/10/2016 FINDINGS: Status post median sternotomy and CABG. Patient's LEFT-sided transvenous pacemaker leads to the RIGHT atrium and RIGHT ventricle. Shallow lung inflation. The heart is enlarged. There is mild streaky atelectasis at the lung bases. No consolidations or evidence for pulmonary edema. IMPRESSION: Cardiomegaly.  No evidence for acute  abnormality. Electronically Signed   By: Nolon Nations M.D.   On: 05/15/2017 14:55   Medications:  . darbepoetin (ARANESP) injection - NON-DIALYSIS  100 mcg Subcutaneous Q Fri-1800  . ezetimibe  10 mg Oral Daily  . furosemide  80 mg Oral BID  . insulin aspart  0-15 Units Subcutaneous TID WC  . insulin glargine  4 Units Subcutaneous QHS  . metoprolol succinate  25 mg Oral Daily  . polyethylene glycol  17 g Oral Daily  . warfarin  6 mg Oral ONCE-1800  . Warfarin - Pharmacist Dosing Inpatient   Does not apply [phone]

## 2017-05-21 DIAGNOSIS — C61 Malignant neoplasm of prostate: Secondary | ICD-10-CM | POA: Diagnosis not present

## 2017-05-21 DIAGNOSIS — N19 Unspecified kidney failure: Secondary | ICD-10-CM | POA: Diagnosis not present

## 2017-05-21 DIAGNOSIS — N184 Chronic kidney disease, stage 4 (severe): Secondary | ICD-10-CM | POA: Diagnosis not present

## 2017-05-21 DIAGNOSIS — E1129 Type 2 diabetes mellitus with other diabetic kidney complication: Secondary | ICD-10-CM | POA: Diagnosis not present

## 2017-05-21 DIAGNOSIS — I25119 Atherosclerotic heart disease of native coronary artery with unspecified angina pectoris: Secondary | ICD-10-CM | POA: Diagnosis not present

## 2017-05-21 DIAGNOSIS — G619 Inflammatory polyneuropathy, unspecified: Secondary | ICD-10-CM | POA: Diagnosis not present

## 2017-05-21 DIAGNOSIS — I2510 Atherosclerotic heart disease of native coronary artery without angina pectoris: Secondary | ICD-10-CM | POA: Diagnosis not present

## 2017-05-21 DIAGNOSIS — G8929 Other chronic pain: Secondary | ICD-10-CM | POA: Diagnosis not present

## 2017-05-21 DIAGNOSIS — R7989 Other specified abnormal findings of blood chemistry: Secondary | ICD-10-CM

## 2017-05-21 DIAGNOSIS — N186 End stage renal disease: Secondary | ICD-10-CM | POA: Diagnosis not present

## 2017-05-21 DIAGNOSIS — R278 Other lack of coordination: Secondary | ICD-10-CM | POA: Diagnosis not present

## 2017-05-21 DIAGNOSIS — E1122 Type 2 diabetes mellitus with diabetic chronic kidney disease: Secondary | ICD-10-CM | POA: Diagnosis not present

## 2017-05-21 DIAGNOSIS — I480 Paroxysmal atrial fibrillation: Secondary | ICD-10-CM | POA: Diagnosis not present

## 2017-05-21 DIAGNOSIS — Z7901 Long term (current) use of anticoagulants: Secondary | ICD-10-CM | POA: Diagnosis not present

## 2017-05-21 DIAGNOSIS — I10 Essential (primary) hypertension: Secondary | ICD-10-CM | POA: Diagnosis not present

## 2017-05-21 DIAGNOSIS — J449 Chronic obstructive pulmonary disease, unspecified: Secondary | ICD-10-CM | POA: Diagnosis not present

## 2017-05-21 DIAGNOSIS — M109 Gout, unspecified: Secondary | ICD-10-CM | POA: Diagnosis not present

## 2017-05-21 DIAGNOSIS — M6281 Muscle weakness (generalized): Secondary | ICD-10-CM | POA: Diagnosis not present

## 2017-05-21 DIAGNOSIS — N185 Chronic kidney disease, stage 5: Secondary | ICD-10-CM | POA: Diagnosis not present

## 2017-05-21 DIAGNOSIS — H04123 Dry eye syndrome of bilateral lacrimal glands: Secondary | ICD-10-CM | POA: Diagnosis not present

## 2017-05-21 DIAGNOSIS — D638 Anemia in other chronic diseases classified elsewhere: Secondary | ICD-10-CM | POA: Diagnosis not present

## 2017-05-21 DIAGNOSIS — E785 Hyperlipidemia, unspecified: Secondary | ICD-10-CM | POA: Diagnosis not present

## 2017-05-21 DIAGNOSIS — R2689 Other abnormalities of gait and mobility: Secondary | ICD-10-CM | POA: Diagnosis not present

## 2017-05-21 DIAGNOSIS — D649 Anemia, unspecified: Secondary | ICD-10-CM | POA: Diagnosis not present

## 2017-05-21 DIAGNOSIS — Z950 Presence of cardiac pacemaker: Secondary | ICD-10-CM | POA: Diagnosis not present

## 2017-05-21 DIAGNOSIS — G894 Chronic pain syndrome: Secondary | ICD-10-CM | POA: Diagnosis not present

## 2017-05-21 DIAGNOSIS — I5040 Unspecified combined systolic (congestive) and diastolic (congestive) heart failure: Secondary | ICD-10-CM | POA: Diagnosis not present

## 2017-05-21 DIAGNOSIS — E1159 Type 2 diabetes mellitus with other circulatory complications: Secondary | ICD-10-CM | POA: Diagnosis not present

## 2017-05-21 DIAGNOSIS — E860 Dehydration: Secondary | ICD-10-CM | POA: Diagnosis not present

## 2017-05-21 DIAGNOSIS — R531 Weakness: Secondary | ICD-10-CM | POA: Diagnosis not present

## 2017-05-21 DIAGNOSIS — I4891 Unspecified atrial fibrillation: Secondary | ICD-10-CM | POA: Diagnosis not present

## 2017-05-21 DIAGNOSIS — N289 Disorder of kidney and ureter, unspecified: Secondary | ICD-10-CM | POA: Diagnosis not present

## 2017-05-21 DIAGNOSIS — N179 Acute kidney failure, unspecified: Secondary | ICD-10-CM | POA: Diagnosis not present

## 2017-05-21 DIAGNOSIS — I519 Heart disease, unspecified: Secondary | ICD-10-CM | POA: Diagnosis not present

## 2017-05-21 DIAGNOSIS — I509 Heart failure, unspecified: Secondary | ICD-10-CM | POA: Diagnosis not present

## 2017-05-21 LAB — BASIC METABOLIC PANEL
ANION GAP: 12 (ref 5–15)
BUN: 98 mg/dL — ABNORMAL HIGH (ref 6–20)
CALCIUM: 8.9 mg/dL (ref 8.9–10.3)
CO2: 28 mmol/L (ref 22–32)
Chloride: 97 mmol/L — ABNORMAL LOW (ref 101–111)
Creatinine, Ser: 2.38 mg/dL — ABNORMAL HIGH (ref 0.61–1.24)
GFR calc Af Amer: 26 mL/min — ABNORMAL LOW (ref >60)
GFR, EST NON AFRICAN AMERICAN: 23 mL/min — AB (ref >60)
GLUCOSE: 237 mg/dL — AB (ref 65–99)
Potassium: 4.7 mmol/L (ref 3.5–5.1)
Sodium: 137 mmol/L (ref 135–145)

## 2017-05-21 LAB — CBC
HCT: 30.4 % — ABNORMAL LOW (ref 39.0–52.0)
HEMOGLOBIN: 9.4 g/dL — AB (ref 13.0–17.0)
MCH: 31.8 pg (ref 26.0–34.0)
MCHC: 30.9 g/dL (ref 30.0–36.0)
MCV: 102.7 fL — ABNORMAL HIGH (ref 78.0–100.0)
Platelets: 429 10*3/uL — ABNORMAL HIGH (ref 150–400)
RBC: 2.96 MIL/uL — ABNORMAL LOW (ref 4.22–5.81)
RDW: 18.8 % — ABNORMAL HIGH (ref 11.5–15.5)
WBC: 7.5 10*3/uL (ref 4.0–10.5)

## 2017-05-21 LAB — PROTIME-INR
INR: 2.32
PROTHROMBIN TIME: 25.3 s — AB (ref 11.4–15.2)

## 2017-05-21 LAB — GLUCOSE, CAPILLARY
GLUCOSE-CAPILLARY: 230 mg/dL — AB (ref 65–99)
GLUCOSE-CAPILLARY: 299 mg/dL — AB (ref 65–99)

## 2017-05-21 MED ORDER — FUROSEMIDE 80 MG PO TABS: 80.0000 mg | ORAL_TABLET | Freq: Two times a day (BID) | ORAL | Status: AC

## 2017-05-21 MED ORDER — WARFARIN SODIUM 6 MG PO TABS
6.0000 mg | ORAL_TABLET | Freq: Once | ORAL | Status: DC
  Filled 2017-05-21: qty 1

## 2017-05-21 MED ORDER — EZETIMIBE 10 MG PO TABS: 10.0000 mg | ORAL_TABLET | Freq: Every day | ORAL | 0 refills | Status: AC

## 2017-05-21 MED ORDER — HYDROCODONE-ACETAMINOPHEN 5-325 MG PO TABS: 1.0000 | ORAL_TABLET | Freq: Four times a day (QID) | ORAL | 0 refills | Status: AC | PRN

## 2017-05-21 MED ORDER — INSULIN ASPART 100 UNIT/ML ~~LOC~~ SOLN
0.0000 [IU] | Freq: Three times a day (TID) | SUBCUTANEOUS | Status: DC
  Administered 2017-05-21: 3 [IU] via SUBCUTANEOUS

## 2017-05-21 NOTE — Progress Notes (Signed)
Report called to Gara Kroner, RN

## 2017-05-21 NOTE — Progress Notes (Signed)
CSW spoke with patient's spouse and son at bedside. [facility] has had a bed open up and patient is able to transfer there today.   Percell Locus Pace [place] LCSW [phone]

## 2017-05-21 NOTE — Progress Notes (Signed)
Patient left with transport. Pain medication administered prior to discharge

## 2017-05-21 NOTE — Progress Notes (Signed)
Patient will DC to: [facility] Anticipated DC date: 05/21/17 Family notified: Spouse and Son Transport by: Rodell Perna   Per MD patient ready for DC to [facility]. RN, patient, patient's family, and facility notified of DC. Discharge Summary sent to facility. RN given number for report [phone] Room [phone]). DC packet on chart. Ambulance transport requested for patient.   CSW signing off.  Cedric Fishman, LCSW Clinical Social Worker [phone]

## 2017-05-21 NOTE — Progress Notes (Signed)
Inpatient Diabetes Program Recommendations  AACE/ADA: New Consensus Statement on Inpatient Glycemic Control (2015)  Target Ranges:  Prepandial:   less than 140 mg/dL      Peak postprandial:   less than 180 mg/dL (1-2 hours)      Critically ill patients:  140 - 180 mg/dL   Lab Results  Component Value Date   GLUCAP 230 (H) 05/21/2017   HGBA1C 8.6 (H) 05/15/2017    Review of Glycemic Control Results for Christopher [name], Christopher [name] (MRN [id number]) as of 05/21/2017 08:53  Ref. Range 05/20/2017 07:52 05/20/2017 11:41 05/20/2017 17:32 05/20/2017 20:17 05/21/2017 07:58  Glucose-Capillary Latest Ref Range: 65 - 99 mg/dL 343 (H) 269 (H) 242 (H) 358 (H) 230 (H)   Diabetes history: DM2 Outpatient Diabetes medications: Amaryl 0.5 mg qd Current orders for Inpatient glycemic control: Lantus 4 units + Novolog sensitive correction tid  Inpatient Diabetes Program Recommendations:   Noted steroids discontinued and has hyperglycemia. -Increase Lantus to 10 units qd (112.3 kg x 0.1 units/kg)  Thank you, Nani Gasser. Troyce Febo, RN, MSN, CDE  Diabetes Coordinator Inpatient Glycemic Control Team Team Pager [phone] (8am-5pm) 05/21/2017 8:55 AM

## 2017-05-21 NOTE — Progress Notes (Signed)
Family Medicine Teaching Service Daily Progress Note Intern Pager: [phone]  Patient name: Christopher [name] Medical record number: [id number] Date of birth: August 17, 1926 Age: 84 y.o. Gender: male  Primary Care Provider: Prince Solian, MD Consultants: Nephrology Code Status: DNR  Pt Overview and Major Events to Date:  3/07: Admitted  3/09: Palliative consulted, recommending outpatient palliative and hospice, PT recommending SNF  Assessment and Plan: Christopher [name] a 84 y.o.malepresenting with generalized weakness. PMH is significant forCAD s/p CABGx 3 in 2003, pacemaker in place, pA-fib on Coumadin, HTN, HFpEF, HLD, CKD V, hx prostate cancer  Generalized weakness secondary to dehydration and azotemia  CKD stage IV: Stable. Followed by Dr. Moshe Cipro for nephrology.  Patient adamant about not pursuing HD.  Nephrology in agreement for continued Lasix. Remains euvolemic with 3.2 L output over the last 24 hours. Weight 247 lb today, unsure of baseline weight. - Holding metolazone, continue Lasix 80 mg twice daily (home dose Lasix 160 mg twice daily) - Palliative consulted, appreciate recommendations - PT consulted, recommending SNF placement  HFpEF  CAD status post CABG  PAF: CABG x3 in 2003.  Last echo 55-60%, mild LVH, indeterminate diastolic dysfunction with moderate dilated left atrium.  Patient without chest pain since admission.  Continuing home beta-blocker. CHADSVASc 5 on Coumadin at home. - Continue home metoprolol succinate 25 mg daily - Coumadin per pharmacy  Primary hypertension: Chronic.  Remains normotensive to 136/70 this am. - Continue home metoprolol succinate 25 mg daily  Non-insulin dependent diabetes mellitus type 2: Chronic. Uncontrolled, last A1c 8.6.  Currently requiring Lantus and sliding scale likely due to daily use of steroids. - Holding home Amaryl 0.5 mg daily - Continue Lantus 4 units daily, started sSSI - Monitor closely off steroids  Anemia of  chronic disease: On Aranesp.  Hemoglobin remained stable at baseline (9-10).  No signs of active bleeding. - Monitoring hemoglobin and signs of active bleeding  History of prostate cancer  Chronic pain: Stable.  No records per chart review. - Palliative and hospice per above - Continue home Norco 5-325 mg every 6 hours as needed for pain  FEN/GI:Heart healthy carb-modified Prophylaxis:Coumadin  Disposition: Awaiting SNF placement  Subjective:  patient states that he feels better today.   Objective: Temp:  [98.1 F (36.7 C)-98.6 F (37 C)] 98.1 F (36.7 C) (03/13 0801) Pulse Rate:  [76-95] 92 (03/13 0801) Resp:  [18-19] 18 (03/13 0801) BP: (120-138)/(62-75) 126/68 (03/13 0801) SpO2:  [98 %-100 %] 98 % (03/13 0801) Weight:  [247 lb 9.3 oz (112.3 kg)] 247 lb 9.3 oz (112.3 kg) (03/12 2028) Physical Exam: General: NAD, pleasant, laying in bed Eyes: PERRL, EOMI, no conjunctival pallor or injection ENTM: Moist mucous membranes Cardiovascular: RRR, no m/r/g, no LE edema Respiratory: CTA BL, normal work of breathing, on 3L [place] Gastrointestinal: soft, nontender, nondistended, normoactive BS MSK: moves 4 extremities equally Derm: no rashes appreciated Neuro: CN II-XII grossly intact Psych: AOx3, appropriate affect  Laboratory: Recent Labs  Lab 05/19/17 0828 05/20/17 0356 05/21/17 0455  WBC 9.4 9.9 7.5  HGB 8.9* 8.9* 9.4*  HCT 29.4* 29.4* 30.4*  PLT 403* 432* 429*   Recent Labs  Lab 05/15/17 1346 05/16/17 0553  05/19/17 0828 05/20/17 0356 05/21/17 0455  NA 136 137   < > 136 137 137  K 4.7 4.8   < > 4.8 4.8 4.7  CL 95* 97*   < > 98* 97* 97*  CO2 27 28   < > 26 29 28   BUN  106* 99*   < > 97* 101* 98*  CREATININE 2.93* 2.68*   < > 2.51* 2.59* 2.38*  CALCIUM 8.9 9.0   < > 9.2 9.0 8.9  PROT 6.6 6.1*  --   --   --   --   BILITOT 1.1 1.1  --   --   --   --   ALKPHOS 58 49  --   --   --   --   ALT 30 28  --   --   --   --   AST 43* 38  --   --   --   --   GLUCOSE  367* 216*   < > 280* 282* 237*   < > = values in this interval not displayed.   3/10 folate: 620, 2279 wnl 3/10 B12: 1,002 wnl 3/10 INR: 1.60 3/08 CK: WNL 3/08 TSH: WNL 3/08 troponin x3: Plateaued 0.04 3/08 hemoglobin A1c: 8.6 3/08 iron studies: Anemia of chronic disease  Imaging/Diagnostic Tests: Dg Chest 2 View  Result Date: 05/15/2017 CLINICAL DATA:  Patient experiencing SOB for 2-3 months. Increasing weakness and aches for 1 year. Chest pain that comes and goes. HTN takes medication. Diabetic takes medication. Hx of A-fib, CAD, cardiac pacemaker, CHF. EXAM: CHEST - 2 VIEW COMPARISON:  06/10/2016 FINDINGS: Status post median sternotomy and CABG. Patient's LEFT-sided transvenous pacemaker leads to the RIGHT atrium and RIGHT ventricle. Shallow lung inflation. The heart is enlarged. There is mild streaky atelectasis at the lung bases. No consolidations or evidence for pulmonary edema. IMPRESSION: Cardiomegaly.  No evidence for acute  abnormality. Electronically Signed   By: Nolon Nations M.D.   On: 05/15/2017 14:55   Ct Head Wo Contrast  Result Date: 05/18/2017 CLINICAL DATA:  Generalized weakness. EXAM: CT HEAD WITHOUT CONTRAST TECHNIQUE: Contiguous axial images were obtained from the base of the skull through the vertex without intravenous contrast. COMPARISON:  07/19/2015 FINDINGS: Brain: Infarct versus artifact in the left pons not seen in 2017. No acute hemorrhage or hydrocephalus. No masslike finding. There is generalized atrophy with mild progression since 2017. Vascular: Atherosclerotic calcification.  No hyperdense vessel. Skull: Negative Sinuses/Orbits: Bilateral cataract resection. No pathologic finding. IMPRESSION: 1. Streak artifact versus left pontine infarct that was not seen on 2017 comparison. Please correlate for associated deficit. 2. Moderate atrophy that has progressed from 2017. Electronically Signed   By: Monte Fantasia M.D.   On: 05/18/2017 18:07     Domonique Brouillard, [place],  DO 05/21/2017, 9:39 AM PGY-1, [facility] Intern pager: [phone], text pages welcome

## 2017-05-21 NOTE — Progress Notes (Signed)
[place] for Coumadin Indication: h/o atrial fibrillation  Allergies  Allergen Reactions  . Crestor [Rosuvastatin] Other (See Comments)    "ACHING JOINTS"  . Lipitor [Atorvastatin] Other (See Comments)    "ACHING JOINTS"  . Statins Other (See Comments)    "ACHING JOINTS"  . Penicillins Rash and Other (See Comments)    Has patient had a PCN reaction causing immediate rash, facial/tongue/throat swelling, SOB or lightheadedness with hypotension: Yes Has patient had a PCN reaction causing severe rash involving mucus membranes or skin necrosis: No Has patient had a PCN reaction that required hospitalization: No Has patient had a PCN reaction occurring within the last 10 years: No If all of the above answers are "NO", then may proceed with Cephalosporin use.   . Tramadol Nausea Only   Patient Measurements: Height: 6\' 1"  (185.4 cm) Weight: 247 lb 9.3 oz (112.3 kg) IBW/kg (Calculated) : 79.9  Assessment: 84 y.o male presented [facility] on [facility]/15/17 with generalized weakness x 2 mos.  H/o Afib, on warfarin 4mg  daily exccept 6mg  on Fridays. INR on admit low at 1.37.  Patient has been getting warfarin 6mg  daily since admission. INR remains therapeutic at 2.32.  Hgb 9.4, plts 429- no bleeding noted.  Goal of Therapy:  INR 2-3 Monitor platelets by anticoagulation protocol: Yes   Plan:  Warfarin 6mg   x 1 tonight Daily INR while here  Recommend warfarin 6mg  daily with an INR check on Friday or Saturday at [facility]   Balthazar Dooly D. Terril Amaro, PharmD, BCPS Clinical Pharmacist Clinical Phone for 05/21/2017 until 3:30pm: [phone] If after 3:30pm, please call main pharmacy at [phone] 05/21/2017 8:51 AM

## 2017-05-21 NOTE — Clinical Social Work Placement (Signed)
   CLINICAL SOCIAL WORK PLACEMENT  NOTE  Date:  05/21/2017  Patient Details  Name: Christopher [name] MRN: [id number] Date of Birth: 1926-03-15  Clinical Social Work is seeking post-discharge placement for this patient at the [place] level of care (*CSW will initial, date and re-position this form in  chart as items are completed):  Yes   Patient/family provided with [facility] Work Department's list of facilities offering this level of care within the geographic area requested by the patient (or if unable, by the patient's family).  Yes   Patient/family informed of their freedom to choose among providers that offer the needed level of care, that participate in Medicare, Medicaid or managed care program needed by the patient, have an available bed and are willing to accept the patient.  Yes   Patient/family informed of [facility]'s ownership interest in [facility] and [facility], as well as of the fact that they are under no obligation to receive care at these facilities.  PASRR submitted to EDS on       PASRR number received on       Existing PASRR number confirmed on 05/21/17     FL2 transmitted to all facilities in geographic area requested by pt/family on 05/21/17     FL2 transmitted to all facilities within larger geographic area on       Patient informed that his/her managed care company has contracts with or will negotiate with certain facilities, including the following:        Yes   Patient/family informed of bed offers received.  Patient chooses bed at [facility]     Physician recommends and patient chooses bed at      Patient to be transferred to [facility] on 05/21/17.  Patient to be transferred to facility by [facility]     Patient family notified on 05/21/17 of transfer.  Name of family member notified:  Spouse and son     PHYSICIAN Please sign DNR, Please prepare priority discharge summary,  including medications     Additional Comment:    _______________________________________________ Benard Halsted, LCSWA 05/21/2017, 12:12 PM

## 2017-05-22 DIAGNOSIS — N184 Chronic kidney disease, stage 4 (severe): Secondary | ICD-10-CM | POA: Diagnosis not present

## 2017-05-22 DIAGNOSIS — I2510 Atherosclerotic heart disease of native coronary artery without angina pectoris: Secondary | ICD-10-CM | POA: Diagnosis not present

## 2017-05-22 DIAGNOSIS — J449 Chronic obstructive pulmonary disease, unspecified: Secondary | ICD-10-CM | POA: Diagnosis not present

## 2017-05-22 DIAGNOSIS — R531 Weakness: Secondary | ICD-10-CM | POA: Diagnosis not present

## 2017-05-23 DIAGNOSIS — D649 Anemia, unspecified: Secondary | ICD-10-CM | POA: Diagnosis not present

## 2017-05-23 DIAGNOSIS — E1122 Type 2 diabetes mellitus with diabetic chronic kidney disease: Secondary | ICD-10-CM | POA: Diagnosis not present

## 2017-05-23 DIAGNOSIS — N179 Acute kidney failure, unspecified: Secondary | ICD-10-CM | POA: Diagnosis not present

## 2017-05-23 DIAGNOSIS — C61 Malignant neoplasm of prostate: Secondary | ICD-10-CM | POA: Diagnosis not present

## 2017-05-23 DIAGNOSIS — G8929 Other chronic pain: Secondary | ICD-10-CM | POA: Diagnosis not present

## 2017-05-23 DIAGNOSIS — R531 Weakness: Secondary | ICD-10-CM | POA: Diagnosis not present

## 2017-05-23 DIAGNOSIS — I4891 Unspecified atrial fibrillation: Secondary | ICD-10-CM | POA: Diagnosis not present

## 2017-05-23 DIAGNOSIS — N186 End stage renal disease: Secondary | ICD-10-CM | POA: Diagnosis not present

## 2017-05-23 DIAGNOSIS — M109 Gout, unspecified: Secondary | ICD-10-CM | POA: Diagnosis not present

## 2017-05-23 DIAGNOSIS — I10 Essential (primary) hypertension: Secondary | ICD-10-CM | POA: Diagnosis not present

## 2017-05-26 DIAGNOSIS — R531 Weakness: Secondary | ICD-10-CM | POA: Diagnosis not present

## 2017-06-13 ENCOUNTER — Encounter (HOSPITAL_COMMUNITY): Payer: Medicare Other

## 2017-08-09 DEATH — deceased
